# Patient Record
Sex: Male | Born: 1940
Health system: Southern US, Community
[De-identification: ages and names within clinical notes are randomized; demographics above are authoritative.]

## PROBLEM LIST (undated history)

## (undated) DIAGNOSIS — F1010 Alcohol abuse, uncomplicated: Secondary | ICD-10-CM

## (undated) DIAGNOSIS — R011 Cardiac murmur, unspecified: Secondary | ICD-10-CM

## (undated) DIAGNOSIS — M1990 Unspecified osteoarthritis, unspecified site: Secondary | ICD-10-CM

## (undated) DIAGNOSIS — J45909 Unspecified asthma, uncomplicated: Secondary | ICD-10-CM

## (undated) DIAGNOSIS — F32A Depression, unspecified: Secondary | ICD-10-CM

## (undated) DIAGNOSIS — F329 Major depressive disorder, single episode, unspecified: Secondary | ICD-10-CM

## (undated) DIAGNOSIS — F419 Anxiety disorder, unspecified: Secondary | ICD-10-CM

## (undated) DIAGNOSIS — I10 Essential (primary) hypertension: Secondary | ICD-10-CM

## (undated) DIAGNOSIS — M069 Rheumatoid arthritis, unspecified: Principal | ICD-10-CM

## (undated) DIAGNOSIS — C61 Malignant neoplasm of prostate: Secondary | ICD-10-CM

## (undated) DIAGNOSIS — K219 Gastro-esophageal reflux disease without esophagitis: Secondary | ICD-10-CM

## (undated) DIAGNOSIS — Z923 Personal history of irradiation: Secondary | ICD-10-CM

## (undated) HISTORY — DX: Depression, unspecified: F32.A

## (undated) HISTORY — DX: Gastro-esophageal reflux disease without esophagitis: K21.9

## (undated) HISTORY — DX: Cardiac murmur, unspecified: R01.1

## (undated) HISTORY — DX: Unspecified osteoarthritis, unspecified site: M19.90

## (undated) HISTORY — DX: Anxiety disorder, unspecified: F41.9

## (undated) HISTORY — DX: Rheumatoid arthritis, unspecified: M06.9

## (undated) HISTORY — DX: Essential (primary) hypertension: I10

## (undated) HISTORY — DX: Major depressive disorder, single episode, unspecified: F32.9

## (undated) HISTORY — DX: Personal history of irradiation: Z92.3

## (undated) HISTORY — PX: SHOULDER SURGERY: SHX246

---

## 1993-02-12 HISTORY — PX: LUMBAR FUSION: SHX111

## 1998-03-30 ENCOUNTER — Encounter: Admission: RE | Admit: 1998-03-30 | Discharge: 1998-04-28 | Payer: Self-pay | Admitting: Orthopaedic Surgery

## 2001-02-11 ENCOUNTER — Encounter: Payer: Self-pay | Admitting: Emergency Medicine

## 2001-02-11 ENCOUNTER — Emergency Department (HOSPITAL_COMMUNITY): Admission: EM | Admit: 2001-02-11 | Discharge: 2001-02-11 | Payer: Self-pay | Admitting: Emergency Medicine

## 2001-04-22 ENCOUNTER — Emergency Department (HOSPITAL_COMMUNITY): Admission: EM | Admit: 2001-04-22 | Discharge: 2001-04-22 | Payer: Self-pay | Admitting: Emergency Medicine

## 2001-04-22 ENCOUNTER — Encounter: Payer: Self-pay | Admitting: Emergency Medicine

## 2001-06-20 ENCOUNTER — Emergency Department (HOSPITAL_COMMUNITY): Admission: EM | Admit: 2001-06-20 | Discharge: 2001-06-20 | Payer: Self-pay | Admitting: Emergency Medicine

## 2001-06-20 ENCOUNTER — Encounter: Payer: Self-pay | Admitting: Emergency Medicine

## 2001-11-21 ENCOUNTER — Encounter: Payer: Self-pay | Admitting: Emergency Medicine

## 2001-11-21 ENCOUNTER — Emergency Department (HOSPITAL_COMMUNITY): Admission: EM | Admit: 2001-11-21 | Discharge: 2001-11-21 | Payer: Self-pay | Admitting: Emergency Medicine

## 2002-01-01 ENCOUNTER — Emergency Department (HOSPITAL_COMMUNITY): Admission: EM | Admit: 2002-01-01 | Discharge: 2002-01-01 | Payer: Self-pay | Admitting: Emergency Medicine

## 2002-04-03 ENCOUNTER — Encounter: Payer: Self-pay | Admitting: Emergency Medicine

## 2002-04-03 ENCOUNTER — Emergency Department (HOSPITAL_COMMUNITY): Admission: EM | Admit: 2002-04-03 | Discharge: 2002-04-03 | Payer: Self-pay | Admitting: Emergency Medicine

## 2002-10-23 ENCOUNTER — Emergency Department (HOSPITAL_COMMUNITY): Admission: EM | Admit: 2002-10-23 | Discharge: 2002-10-23 | Payer: Self-pay

## 2002-10-23 ENCOUNTER — Encounter: Payer: Self-pay | Admitting: Emergency Medicine

## 2002-12-02 ENCOUNTER — Inpatient Hospital Stay (HOSPITAL_COMMUNITY): Admission: EM | Admit: 2002-12-02 | Discharge: 2002-12-03 | Payer: Self-pay | Admitting: *Deleted

## 2002-12-02 ENCOUNTER — Encounter: Payer: Self-pay | Admitting: Emergency Medicine

## 2002-12-07 ENCOUNTER — Encounter: Admission: RE | Admit: 2002-12-07 | Discharge: 2002-12-07 | Payer: Self-pay | Admitting: Family Medicine

## 2002-12-30 ENCOUNTER — Encounter: Admission: RE | Admit: 2002-12-30 | Discharge: 2002-12-30 | Payer: Self-pay | Admitting: Family Medicine

## 2003-01-20 ENCOUNTER — Encounter: Admission: RE | Admit: 2003-01-20 | Discharge: 2003-01-20 | Payer: Self-pay | Admitting: Family Medicine

## 2003-01-26 ENCOUNTER — Encounter: Admission: RE | Admit: 2003-01-26 | Discharge: 2003-01-26 | Payer: Self-pay | Admitting: Sports Medicine

## 2003-01-27 ENCOUNTER — Encounter: Admission: RE | Admit: 2003-01-27 | Discharge: 2003-01-27 | Payer: Self-pay | Admitting: Sports Medicine

## 2003-11-13 ENCOUNTER — Emergency Department (HOSPITAL_COMMUNITY): Admission: EM | Admit: 2003-11-13 | Discharge: 2003-11-13 | Payer: Self-pay | Admitting: Emergency Medicine

## 2004-04-20 ENCOUNTER — Emergency Department (HOSPITAL_COMMUNITY): Admission: EM | Admit: 2004-04-20 | Discharge: 2004-04-20 | Payer: Self-pay | Admitting: Emergency Medicine

## 2004-07-17 ENCOUNTER — Emergency Department (HOSPITAL_COMMUNITY): Admission: EM | Admit: 2004-07-17 | Discharge: 2004-07-17 | Payer: Self-pay | Admitting: Emergency Medicine

## 2004-08-03 ENCOUNTER — Ambulatory Visit: Payer: Self-pay | Admitting: Family Medicine

## 2004-09-26 ENCOUNTER — Emergency Department (HOSPITAL_COMMUNITY): Admission: EM | Admit: 2004-09-26 | Discharge: 2004-09-26 | Payer: Self-pay | Admitting: Emergency Medicine

## 2004-09-29 ENCOUNTER — Ambulatory Visit: Payer: Self-pay | Admitting: Family Medicine

## 2004-10-22 ENCOUNTER — Encounter: Payer: Self-pay | Admitting: Emergency Medicine

## 2004-10-22 ENCOUNTER — Inpatient Hospital Stay (HOSPITAL_COMMUNITY): Admission: AD | Admit: 2004-10-22 | Discharge: 2004-10-24 | Payer: Self-pay | Admitting: Internal Medicine

## 2004-10-24 ENCOUNTER — Encounter (INDEPENDENT_AMBULATORY_CARE_PROVIDER_SITE_OTHER): Payer: Self-pay | Admitting: Cardiology

## 2004-11-10 ENCOUNTER — Ambulatory Visit: Payer: Self-pay | Admitting: Family Medicine

## 2005-01-01 ENCOUNTER — Emergency Department (HOSPITAL_COMMUNITY): Admission: EM | Admit: 2005-01-01 | Discharge: 2005-01-01 | Payer: Self-pay | Admitting: Emergency Medicine

## 2005-03-02 ENCOUNTER — Ambulatory Visit: Payer: Self-pay | Admitting: Family Medicine

## 2005-04-02 ENCOUNTER — Observation Stay (HOSPITAL_COMMUNITY): Admission: EM | Admit: 2005-04-02 | Discharge: 2005-04-03 | Payer: Self-pay | Admitting: Emergency Medicine

## 2005-04-02 ENCOUNTER — Ambulatory Visit: Payer: Self-pay | Admitting: Family Medicine

## 2005-04-11 ENCOUNTER — Ambulatory Visit: Payer: Self-pay | Admitting: Family Medicine

## 2005-04-30 ENCOUNTER — Ambulatory Visit: Payer: Self-pay | Admitting: Sports Medicine

## 2005-05-11 ENCOUNTER — Ambulatory Visit: Payer: Self-pay | Admitting: Family Medicine

## 2005-06-19 ENCOUNTER — Inpatient Hospital Stay (HOSPITAL_COMMUNITY): Admission: AD | Admit: 2005-06-19 | Discharge: 2005-06-20 | Payer: Self-pay | Admitting: Sports Medicine

## 2005-06-19 ENCOUNTER — Ambulatory Visit (HOSPITAL_COMMUNITY): Admission: RE | Admit: 2005-06-19 | Discharge: 2005-06-19 | Payer: Self-pay | Admitting: Family Medicine

## 2005-06-19 ENCOUNTER — Ambulatory Visit: Payer: Self-pay | Admitting: Sports Medicine

## 2005-06-19 ENCOUNTER — Ambulatory Visit: Payer: Self-pay | Admitting: Family Medicine

## 2005-06-29 ENCOUNTER — Ambulatory Visit: Payer: Self-pay | Admitting: Family Medicine

## 2005-09-18 ENCOUNTER — Emergency Department (HOSPITAL_COMMUNITY): Admission: EM | Admit: 2005-09-18 | Discharge: 2005-09-18 | Payer: Self-pay | Admitting: Emergency Medicine

## 2005-10-05 ENCOUNTER — Ambulatory Visit: Payer: Self-pay | Admitting: Family Medicine

## 2005-11-05 ENCOUNTER — Ambulatory Visit: Payer: Self-pay | Admitting: Family Medicine

## 2005-11-20 ENCOUNTER — Ambulatory Visit: Payer: Self-pay | Admitting: Family Medicine

## 2005-11-27 ENCOUNTER — Inpatient Hospital Stay (HOSPITAL_COMMUNITY): Admission: RE | Admit: 2005-11-27 | Discharge: 2005-11-28 | Payer: Self-pay | Admitting: Gastroenterology

## 2005-11-27 ENCOUNTER — Encounter (INDEPENDENT_AMBULATORY_CARE_PROVIDER_SITE_OTHER): Payer: Self-pay | Admitting: *Deleted

## 2005-12-17 ENCOUNTER — Emergency Department (HOSPITAL_COMMUNITY): Admission: EM | Admit: 2005-12-17 | Discharge: 2005-12-17 | Payer: Self-pay | Admitting: Family Medicine

## 2006-04-11 DIAGNOSIS — G43909 Migraine, unspecified, not intractable, without status migrainosus: Secondary | ICD-10-CM | POA: Insufficient documentation

## 2006-04-11 DIAGNOSIS — K219 Gastro-esophageal reflux disease without esophagitis: Secondary | ICD-10-CM | POA: Insufficient documentation

## 2006-04-11 DIAGNOSIS — I10 Essential (primary) hypertension: Secondary | ICD-10-CM

## 2006-05-13 ENCOUNTER — Telehealth: Payer: Self-pay | Admitting: *Deleted

## 2006-05-14 ENCOUNTER — Ambulatory Visit: Payer: Self-pay | Admitting: Sports Medicine

## 2006-05-28 ENCOUNTER — Ambulatory Visit: Payer: Self-pay | Admitting: Family Medicine

## 2006-05-28 ENCOUNTER — Encounter (INDEPENDENT_AMBULATORY_CARE_PROVIDER_SITE_OTHER): Payer: Self-pay | Admitting: Family Medicine

## 2006-05-28 ENCOUNTER — Telehealth: Payer: Self-pay | Admitting: *Deleted

## 2006-06-21 ENCOUNTER — Ambulatory Visit: Payer: Self-pay | Admitting: Sports Medicine

## 2006-06-21 ENCOUNTER — Encounter (INDEPENDENT_AMBULATORY_CARE_PROVIDER_SITE_OTHER): Payer: Self-pay | Admitting: *Deleted

## 2006-06-21 DIAGNOSIS — L821 Other seborrheic keratosis: Secondary | ICD-10-CM

## 2006-06-21 LAB — CONVERTED CEMR LAB
BUN: 10 mg/dL (ref 6–23)
Chloride: 103 meq/L (ref 96–112)
Glucose, Bld: 98 mg/dL (ref 70–99)
Potassium: 4.2 meq/L (ref 3.5–5.3)
Sodium: 139 meq/L (ref 135–145)

## 2006-08-21 ENCOUNTER — Emergency Department (HOSPITAL_COMMUNITY): Admission: EM | Admit: 2006-08-21 | Discharge: 2006-08-21 | Payer: Self-pay | Admitting: Family Medicine

## 2006-10-11 ENCOUNTER — Emergency Department (HOSPITAL_COMMUNITY): Admission: EM | Admit: 2006-10-11 | Discharge: 2006-10-11 | Payer: Self-pay | Admitting: Emergency Medicine

## 2006-11-01 ENCOUNTER — Ambulatory Visit: Payer: Self-pay | Admitting: Family Medicine

## 2006-11-01 ENCOUNTER — Encounter (INDEPENDENT_AMBULATORY_CARE_PROVIDER_SITE_OTHER): Payer: Self-pay | Admitting: Family Medicine

## 2006-11-01 LAB — CONVERTED CEMR LAB
Basophils Absolute: 0 10*3/uL (ref 0.0–0.1)
Basophils Relative: 0 % (ref 0–1)
CO2: 31 meq/L (ref 19–32)
HCT: 42.3 % (ref 39.0–52.0)
Lymphocytes Relative: 14 % (ref 12–46)
Lymphs Abs: 0.7 10*3/uL (ref 0.7–3.3)
Monocytes Absolute: 0.6 10*3/uL (ref 0.2–0.7)
Neutro Abs: 3.3 10*3/uL (ref 1.7–7.7)
Neutrophils Relative %: 71 % (ref 43–77)
Platelets: 258 10*3/uL (ref 150–400)
RBC: 4.9 M/uL (ref 4.22–5.81)
Sodium: 138 meq/L (ref 135–145)
WBC: 4.7 10*3/uL (ref 4.0–10.5)

## 2006-12-09 ENCOUNTER — Encounter (INDEPENDENT_AMBULATORY_CARE_PROVIDER_SITE_OTHER): Payer: Self-pay | Admitting: *Deleted

## 2007-02-04 ENCOUNTER — Ambulatory Visit: Payer: Self-pay | Admitting: Family Medicine

## 2007-02-04 ENCOUNTER — Ambulatory Visit (HOSPITAL_COMMUNITY): Admission: RE | Admit: 2007-02-04 | Discharge: 2007-02-04 | Payer: Self-pay | Admitting: Family Medicine

## 2007-02-19 ENCOUNTER — Encounter (INDEPENDENT_AMBULATORY_CARE_PROVIDER_SITE_OTHER): Payer: Self-pay | Admitting: *Deleted

## 2007-03-06 ENCOUNTER — Encounter (INDEPENDENT_AMBULATORY_CARE_PROVIDER_SITE_OTHER): Payer: Self-pay | Admitting: *Deleted

## 2007-03-06 ENCOUNTER — Ambulatory Visit: Payer: Self-pay | Admitting: Family Medicine

## 2007-03-07 LAB — CONVERTED CEMR LAB
Chloride: 102 meq/L (ref 96–112)
Creatinine, Ser: 0.91 mg/dL (ref 0.40–1.50)
Glucose, Bld: 90 mg/dL (ref 70–99)
HDL: 55 mg/dL (ref >39)
LDL Cholesterol: 115 mg/dL — ABNORMAL HIGH (ref 0–99)
PSA: 1.78 ng/mL (ref 0.10–4.00)
Total CHOL/HDL Ratio: 3.4

## 2007-04-30 ENCOUNTER — Encounter (INDEPENDENT_AMBULATORY_CARE_PROVIDER_SITE_OTHER): Payer: Self-pay | Admitting: *Deleted

## 2007-05-12 ENCOUNTER — Ambulatory Visit: Payer: Self-pay | Admitting: Internal Medicine

## 2007-05-12 ENCOUNTER — Observation Stay (HOSPITAL_COMMUNITY): Admission: EM | Admit: 2007-05-12 | Discharge: 2007-05-13 | Payer: Self-pay | Admitting: Emergency Medicine

## 2007-05-12 ENCOUNTER — Encounter (INDEPENDENT_AMBULATORY_CARE_PROVIDER_SITE_OTHER): Payer: Self-pay | Admitting: Family Medicine

## 2007-05-12 DIAGNOSIS — J449 Chronic obstructive pulmonary disease, unspecified: Secondary | ICD-10-CM | POA: Insufficient documentation

## 2007-05-12 DIAGNOSIS — R0789 Other chest pain: Secondary | ICD-10-CM

## 2007-05-19 ENCOUNTER — Ambulatory Visit: Payer: Self-pay | Admitting: Family Medicine

## 2007-06-02 ENCOUNTER — Ambulatory Visit: Payer: Self-pay | Admitting: Cardiology

## 2007-06-03 ENCOUNTER — Ambulatory Visit: Payer: Self-pay

## 2007-06-03 ENCOUNTER — Encounter (INDEPENDENT_AMBULATORY_CARE_PROVIDER_SITE_OTHER): Payer: Self-pay | Admitting: *Deleted

## 2007-06-16 ENCOUNTER — Ambulatory Visit: Payer: Self-pay | Admitting: Sports Medicine

## 2007-06-24 ENCOUNTER — Ambulatory Visit: Payer: Self-pay | Admitting: Family Medicine

## 2007-06-24 ENCOUNTER — Encounter (INDEPENDENT_AMBULATORY_CARE_PROVIDER_SITE_OTHER): Payer: Self-pay | Admitting: *Deleted

## 2007-06-24 DIAGNOSIS — E785 Hyperlipidemia, unspecified: Secondary | ICD-10-CM | POA: Insufficient documentation

## 2007-06-24 LAB — CONVERTED CEMR LAB: Direct LDL: 99 mg/dL

## 2007-06-26 ENCOUNTER — Encounter (INDEPENDENT_AMBULATORY_CARE_PROVIDER_SITE_OTHER): Payer: Self-pay | Admitting: *Deleted

## 2007-09-18 ENCOUNTER — Telehealth: Payer: Self-pay | Admitting: *Deleted

## 2007-09-18 ENCOUNTER — Ambulatory Visit: Payer: Self-pay | Admitting: Family Medicine

## 2007-09-26 ENCOUNTER — Telehealth: Payer: Self-pay | Admitting: *Deleted

## 2007-10-13 ENCOUNTER — Telehealth: Payer: Self-pay | Admitting: *Deleted

## 2007-10-13 ENCOUNTER — Encounter: Payer: Self-pay | Admitting: Family Medicine

## 2007-10-13 ENCOUNTER — Ambulatory Visit: Payer: Self-pay | Admitting: Family Medicine

## 2007-10-14 ENCOUNTER — Ambulatory Visit: Payer: Self-pay | Admitting: Sports Medicine

## 2007-10-14 LAB — CONVERTED CEMR LAB
Calcium: 9.5 mg/dL (ref 8.4–10.5)
Chloride: 103 meq/L (ref 96–112)
Creatinine, Ser: 1.1 mg/dL (ref 0.40–1.50)
Glucose, Bld: 97 mg/dL (ref 70–99)
Hemoglobin: 14.1 g/dL (ref 13.0–17.0)
MCHC: 33.1 g/dL (ref 30.0–36.0)
MCV: 86.1 fL (ref 78.0–100.0)
Potassium: 4.1 meq/L (ref 3.5–5.3)
RBC: 4.95 M/uL (ref 4.22–5.81)
RDW: 15.1 % (ref 11.5–15.5)

## 2007-10-22 ENCOUNTER — Encounter: Payer: Self-pay | Admitting: Family Medicine

## 2007-10-22 ENCOUNTER — Ambulatory Visit: Payer: Self-pay | Admitting: Vascular Surgery

## 2007-10-22 ENCOUNTER — Ambulatory Visit (HOSPITAL_COMMUNITY): Admission: RE | Admit: 2007-10-22 | Discharge: 2007-10-22 | Payer: Self-pay | Admitting: Family Medicine

## 2007-10-22 ENCOUNTER — Ambulatory Visit: Payer: Self-pay | Admitting: Cardiovascular Disease

## 2008-01-22 ENCOUNTER — Telehealth: Payer: Self-pay | Admitting: *Deleted

## 2008-01-22 ENCOUNTER — Ambulatory Visit: Payer: Self-pay | Admitting: Family Medicine

## 2008-01-22 ENCOUNTER — Encounter: Payer: Self-pay | Admitting: Family Medicine

## 2008-02-19 ENCOUNTER — Telehealth (INDEPENDENT_AMBULATORY_CARE_PROVIDER_SITE_OTHER): Payer: Self-pay | Admitting: *Deleted

## 2008-02-23 ENCOUNTER — Encounter: Admission: RE | Admit: 2008-02-23 | Discharge: 2008-04-19 | Payer: Self-pay | Admitting: Family Medicine

## 2008-02-23 ENCOUNTER — Encounter: Payer: Self-pay | Admitting: Family Medicine

## 2008-04-26 ENCOUNTER — Ambulatory Visit: Payer: Self-pay | Admitting: Family Medicine

## 2008-05-27 ENCOUNTER — Telehealth: Payer: Self-pay | Admitting: *Deleted

## 2008-06-22 ENCOUNTER — Telehealth: Payer: Self-pay | Admitting: Family Medicine

## 2008-06-23 ENCOUNTER — Ambulatory Visit: Payer: Self-pay | Admitting: Family Medicine

## 2008-06-23 ENCOUNTER — Ambulatory Visit (HOSPITAL_COMMUNITY): Admission: RE | Admit: 2008-06-23 | Discharge: 2008-06-23 | Payer: Self-pay | Admitting: Family Medicine

## 2008-06-23 ENCOUNTER — Encounter (INDEPENDENT_AMBULATORY_CARE_PROVIDER_SITE_OTHER): Payer: Self-pay | Admitting: Family Medicine

## 2008-06-23 DIAGNOSIS — R42 Dizziness and giddiness: Secondary | ICD-10-CM | POA: Insufficient documentation

## 2008-06-24 LAB — CONVERTED CEMR LAB
Basophils Relative: 0 % (ref 0–1)
Creatinine, Ser: 0.85 mg/dL (ref 0.40–1.50)
Eosinophils Relative: 0 % (ref 0–5)
HCT: 42.7 % (ref 39.0–52.0)
Hemoglobin: 14.3 g/dL (ref 13.0–17.0)
Lymphs Abs: 0.7 10*3/uL (ref 0.7–4.0)
MCHC: 33.5 g/dL (ref 30.0–36.0)
MCV: 86.1 fL (ref 78.0–100.0)
Monocytes Absolute: 0.4 10*3/uL (ref 0.1–1.0)
Neutro Abs: 2.2 10*3/uL (ref 1.7–7.7)
Neutrophils Relative %: 66 % (ref 43–77)
Platelets: 200 10*3/uL (ref 150–400)
Sodium: 139 meq/L (ref 135–145)

## 2008-06-25 ENCOUNTER — Encounter: Payer: Self-pay | Admitting: Family Medicine

## 2008-06-28 ENCOUNTER — Encounter: Payer: Self-pay | Admitting: Family Medicine

## 2008-06-28 ENCOUNTER — Ambulatory Visit: Payer: Self-pay | Admitting: Family Medicine

## 2008-07-01 DIAGNOSIS — C61 Malignant neoplasm of prostate: Secondary | ICD-10-CM | POA: Insufficient documentation

## 2008-07-01 DIAGNOSIS — R972 Elevated prostate specific antigen [PSA]: Secondary | ICD-10-CM | POA: Insufficient documentation

## 2008-07-01 LAB — CONVERTED CEMR LAB
Basophils Relative: 0 % (ref 0–1)
Eosinophils Absolute: 0 10*3/uL (ref 0.0–0.7)
Eosinophils Relative: 1 % (ref 0–5)
Hemoglobin: 13.3 g/dL (ref 13.0–17.0)
MCV: 86.1 fL (ref 78.0–100.0)
Monocytes Absolute: 0.6 10*3/uL (ref 0.1–1.0)
Neutro Abs: 2.3 10*3/uL (ref 1.7–7.7)
PSA: 3.27 ng/mL (ref 0.10–4.00)
RBC: 4.45 M/uL (ref 4.22–5.81)
WBC: 3.6 10*3/uL — ABNORMAL LOW (ref 4.0–10.5)

## 2008-07-19 ENCOUNTER — Encounter (INDEPENDENT_AMBULATORY_CARE_PROVIDER_SITE_OTHER): Payer: Self-pay | Admitting: Family Medicine

## 2008-07-30 ENCOUNTER — Ambulatory Visit: Payer: Self-pay | Admitting: Family Medicine

## 2008-08-02 ENCOUNTER — Ambulatory Visit (HOSPITAL_COMMUNITY): Admission: RE | Admit: 2008-08-02 | Discharge: 2008-08-02 | Payer: Self-pay | Admitting: Family Medicine

## 2008-08-18 ENCOUNTER — Encounter: Payer: Self-pay | Admitting: Family Medicine

## 2008-08-26 ENCOUNTER — Encounter: Payer: Self-pay | Admitting: Family Medicine

## 2008-09-09 ENCOUNTER — Encounter: Payer: Self-pay | Admitting: *Deleted

## 2008-09-09 ENCOUNTER — Encounter: Payer: Self-pay | Admitting: Family Medicine

## 2008-09-09 ENCOUNTER — Ambulatory Visit: Payer: Self-pay | Admitting: Family Medicine

## 2008-09-09 DIAGNOSIS — G252 Other specified forms of tremor: Secondary | ICD-10-CM

## 2008-09-09 DIAGNOSIS — M2550 Pain in unspecified joint: Secondary | ICD-10-CM | POA: Insufficient documentation

## 2008-09-09 DIAGNOSIS — G250 Essential tremor: Secondary | ICD-10-CM

## 2008-09-09 LAB — CONVERTED CEMR LAB
Folate: 6 ng/mL
Vitamin B-12: 276 pg/mL (ref 211–911)

## 2008-09-14 ENCOUNTER — Encounter: Payer: Self-pay | Admitting: *Deleted

## 2008-09-20 ENCOUNTER — Encounter: Payer: Self-pay | Admitting: Family Medicine

## 2008-09-20 ENCOUNTER — Telehealth: Payer: Self-pay | Admitting: *Deleted

## 2008-09-20 ENCOUNTER — Ambulatory Visit: Payer: Self-pay | Admitting: Family Medicine

## 2008-09-20 DIAGNOSIS — R002 Palpitations: Secondary | ICD-10-CM

## 2008-09-20 DIAGNOSIS — R55 Syncope and collapse: Secondary | ICD-10-CM | POA: Insufficient documentation

## 2008-09-20 LAB — CONVERTED CEMR LAB
ALT: 16 units/L (ref 0–53)
AST: 11 units/L (ref 0–37)
Albumin: 4.1 g/dL (ref 3.5–5.2)
Alkaline Phosphatase: 44 units/L (ref 39–117)
BUN: 26 mg/dL — ABNORMAL HIGH (ref 6–23)
CO2: 23 meq/L (ref 19–32)
Calcium: 9.3 mg/dL (ref 8.4–10.5)
Chloride: 104 meq/L (ref 96–112)
Creatinine, Ser: 1.24 mg/dL (ref 0.40–1.50)
Glucose, Bld: 121 mg/dL — ABNORMAL HIGH (ref 70–99)
HCT: 39.7 % (ref 39.0–52.0)
Hemoglobin: 13 g/dL (ref 13.0–17.0)
MCHC: 32.7 g/dL (ref 30.0–36.0)
MCV: 86.5 fL (ref 78.0–100.0)
Platelets: 231 10*3/uL (ref 150–400)
Potassium: 4.9 meq/L (ref 3.5–5.3)
RBC: 4.59 M/uL (ref 4.22–5.81)
RDW: 15.9 % — ABNORMAL HIGH (ref 11.5–15.5)
Sodium: 139 meq/L (ref 135–145)
TSH: 0.676 microintl units/mL (ref 0.350–4.500)
Total Bilirubin: 0.5 mg/dL (ref 0.3–1.2)
Total Protein: 6.8 g/dL (ref 6.0–8.3)
WBC: 6.7 10*3/uL (ref 4.0–10.5)

## 2008-09-21 ENCOUNTER — Ambulatory Visit: Payer: Self-pay | Admitting: Family Medicine

## 2008-09-21 DIAGNOSIS — R7309 Other abnormal glucose: Secondary | ICD-10-CM

## 2008-09-22 ENCOUNTER — Encounter: Payer: Self-pay | Admitting: Family Medicine

## 2008-09-24 ENCOUNTER — Encounter: Payer: Self-pay | Admitting: Family Medicine

## 2008-09-28 ENCOUNTER — Ambulatory Visit: Payer: Self-pay | Admitting: Family Medicine

## 2008-10-04 ENCOUNTER — Encounter: Payer: Self-pay | Admitting: *Deleted

## 2008-10-22 ENCOUNTER — Encounter: Payer: Self-pay | Admitting: Family Medicine

## 2008-10-27 ENCOUNTER — Encounter: Payer: Self-pay | Admitting: Family Medicine

## 2008-11-04 ENCOUNTER — Encounter: Payer: Self-pay | Admitting: Family Medicine

## 2008-11-08 ENCOUNTER — Encounter: Payer: Self-pay | Admitting: Family Medicine

## 2008-12-03 ENCOUNTER — Emergency Department (HOSPITAL_COMMUNITY): Admission: EM | Admit: 2008-12-03 | Discharge: 2008-12-03 | Payer: Self-pay | Admitting: Family Medicine

## 2008-12-07 ENCOUNTER — Ambulatory Visit: Payer: Self-pay | Admitting: Family Medicine

## 2008-12-15 ENCOUNTER — Encounter: Payer: Self-pay | Admitting: Family Medicine

## 2008-12-20 ENCOUNTER — Encounter: Payer: Self-pay | Admitting: Family Medicine

## 2008-12-22 ENCOUNTER — Ambulatory Visit: Payer: Self-pay | Admitting: Family Medicine

## 2008-12-23 ENCOUNTER — Ambulatory Visit (HOSPITAL_COMMUNITY): Admission: RE | Admit: 2008-12-23 | Discharge: 2008-12-23 | Payer: Self-pay | Admitting: Family Medicine

## 2009-01-03 ENCOUNTER — Encounter: Payer: Self-pay | Admitting: Family Medicine

## 2009-01-11 ENCOUNTER — Ambulatory Visit: Payer: Self-pay | Admitting: Family Medicine

## 2009-01-11 ENCOUNTER — Telehealth: Payer: Self-pay | Admitting: Family Medicine

## 2009-01-18 ENCOUNTER — Telehealth: Payer: Self-pay | Admitting: *Deleted

## 2009-01-20 ENCOUNTER — Encounter: Admission: RE | Admit: 2009-01-20 | Discharge: 2009-02-10 | Payer: Self-pay | Admitting: Family Medicine

## 2009-01-20 ENCOUNTER — Encounter: Payer: Self-pay | Admitting: Family Medicine

## 2009-02-14 ENCOUNTER — Encounter: Admission: RE | Admit: 2009-02-14 | Discharge: 2009-02-16 | Payer: Self-pay | Admitting: Family Medicine

## 2009-02-21 ENCOUNTER — Encounter: Payer: Self-pay | Admitting: Family Medicine

## 2009-02-22 ENCOUNTER — Telehealth: Payer: Self-pay | Admitting: Family Medicine

## 2009-02-24 ENCOUNTER — Encounter: Payer: Self-pay | Admitting: Family Medicine

## 2009-03-02 ENCOUNTER — Ambulatory Visit: Payer: Self-pay | Admitting: Family Medicine

## 2009-03-03 ENCOUNTER — Encounter: Payer: Self-pay | Admitting: Family Medicine

## 2009-03-16 ENCOUNTER — Encounter: Payer: Self-pay | Admitting: Family Medicine

## 2009-04-13 ENCOUNTER — Encounter: Payer: Self-pay | Admitting: Family Medicine

## 2009-04-27 ENCOUNTER — Encounter: Payer: Self-pay | Admitting: Family Medicine

## 2009-05-13 ENCOUNTER — Encounter: Payer: Self-pay | Admitting: Family Medicine

## 2009-05-13 ENCOUNTER — Ambulatory Visit: Payer: Self-pay | Admitting: Family Medicine

## 2009-05-13 LAB — CONVERTED CEMR LAB
AST: 15 units/L (ref 0–37)
Albumin: 4 g/dL (ref 3.5–5.2)
CO2: 26 meq/L (ref 19–32)
Cholesterol: 210 mg/dL — ABNORMAL HIGH (ref 0–200)
Creatinine, Ser: 0.91 mg/dL (ref 0.40–1.50)
HDL: 46 mg/dL (ref >39)
Hemoglobin: 12.5 g/dL — ABNORMAL LOW (ref 13.0–17.0)
MCHC: 32.6 g/dL (ref 30.0–36.0)
Potassium: 4.4 meq/L (ref 3.5–5.3)
Total CHOL/HDL Ratio: 4.6
Total Protein: 6.5 g/dL (ref 6.0–8.3)
Triglycerides: 95 mg/dL (ref <150)
WBC: 4.1 10*3/uL (ref 4.0–10.5)

## 2009-05-16 ENCOUNTER — Encounter: Payer: Self-pay | Admitting: Family Medicine

## 2009-05-25 ENCOUNTER — Ambulatory Visit: Payer: Self-pay | Admitting: Family Medicine

## 2009-07-12 ENCOUNTER — Encounter: Payer: Self-pay | Admitting: Family Medicine

## 2009-07-21 ENCOUNTER — Encounter: Payer: Self-pay | Admitting: Family Medicine

## 2009-08-09 ENCOUNTER — Telehealth: Payer: Self-pay | Admitting: Family Medicine

## 2009-08-09 ENCOUNTER — Ambulatory Visit: Payer: Self-pay | Admitting: Family Medicine

## 2009-09-21 ENCOUNTER — Ambulatory Visit: Payer: Self-pay | Admitting: Family Medicine

## 2009-09-26 ENCOUNTER — Encounter: Payer: Self-pay | Admitting: Family Medicine

## 2009-10-11 ENCOUNTER — Telehealth: Payer: Self-pay | Admitting: Family Medicine

## 2009-11-21 ENCOUNTER — Telehealth: Payer: Self-pay | Admitting: Family Medicine

## 2010-01-13 ENCOUNTER — Encounter: Payer: Self-pay | Admitting: *Deleted

## 2010-01-16 ENCOUNTER — Encounter: Payer: Self-pay | Admitting: Family Medicine

## 2010-01-19 ENCOUNTER — Ambulatory Visit: Payer: Self-pay | Admitting: Family Medicine

## 2010-01-19 DIAGNOSIS — M25519 Pain in unspecified shoulder: Secondary | ICD-10-CM | POA: Insufficient documentation

## 2010-02-27 ENCOUNTER — Encounter: Payer: Self-pay | Admitting: Family Medicine

## 2010-02-28 ENCOUNTER — Emergency Department (HOSPITAL_COMMUNITY)
Admission: EM | Admit: 2010-02-28 | Discharge: 2010-02-28 | Payer: Self-pay | Source: Home / Self Care | Admitting: Family Medicine

## 2010-03-01 LAB — POCT RAPID STREP A (OFFICE): Streptococcus, Group A Screen (Direct): NEGATIVE

## 2010-03-03 ENCOUNTER — Ambulatory Visit
Admission: RE | Admit: 2010-03-03 | Discharge: 2010-03-03 | Payer: Self-pay | Source: Home / Self Care | Attending: Family Medicine | Admitting: Family Medicine

## 2010-03-06 ENCOUNTER — Ambulatory Visit (HOSPITAL_COMMUNITY)
Admission: RE | Admit: 2010-03-06 | Discharge: 2010-03-06 | Payer: Self-pay | Source: Home / Self Care | Attending: Family Medicine | Admitting: Family Medicine

## 2010-03-07 ENCOUNTER — Telehealth: Payer: Self-pay | Admitting: *Deleted

## 2010-03-09 ENCOUNTER — Encounter (INDEPENDENT_AMBULATORY_CARE_PROVIDER_SITE_OTHER): Payer: Self-pay | Admitting: *Deleted

## 2010-03-10 ENCOUNTER — Encounter: Payer: Self-pay | Admitting: Family Medicine

## 2010-03-10 ENCOUNTER — Ambulatory Visit: Admit: 2010-03-10 | Payer: Self-pay

## 2010-03-11 ENCOUNTER — Emergency Department (HOSPITAL_COMMUNITY)
Admission: EM | Admit: 2010-03-11 | Discharge: 2010-03-11 | Payer: Self-pay | Source: Home / Self Care | Admitting: Emergency Medicine

## 2010-03-11 ENCOUNTER — Encounter: Payer: Self-pay | Admitting: Sports Medicine

## 2010-03-11 LAB — URINE MICROSCOPIC-ADD ON

## 2010-03-11 LAB — COMPREHENSIVE METABOLIC PANEL
CO2: 24 mEq/L (ref 19–32)
Chloride: 103 mEq/L (ref 96–112)
Creatinine, Ser: 1.14 mg/dL (ref 0.4–1.5)
GFR calc Af Amer: >60 mL/min (ref >60)
Glucose, Bld: 117 mg/dL — ABNORMAL HIGH (ref 70–99)
Potassium: 4.1 mEq/L (ref 3.5–5.1)
Total Protein: 7.3 g/dL (ref 6.0–8.3)

## 2010-03-11 LAB — CBC
Platelets: 257 10*3/uL (ref 150–400)
WBC: 14 10*3/uL — ABNORMAL HIGH (ref 4.0–10.5)

## 2010-03-11 LAB — URINALYSIS, ROUTINE W REFLEX MICROSCOPIC
Bilirubin Urine: NEGATIVE
Ketones, ur: 15 mg/dL — AB
Nitrite: NEGATIVE
Protein, ur: 30 mg/dL — AB
Specific Gravity, Urine: 1.021 (ref 1.005–1.030)
Urine Glucose, Fasting: NEGATIVE mg/dL
Urobilinogen, UA: 0.2 mg/dL (ref 0.0–1.0)
pH: 6.5 (ref 5.0–8.0)

## 2010-03-11 LAB — DIFFERENTIAL
Basophils Relative: 0 % (ref 0–1)
Eosinophils Absolute: 0 10*3/uL (ref 0.0–0.7)
Monocytes Absolute: 1 10*3/uL (ref 0.1–1.0)
Monocytes Relative: 7 % (ref 3–12)
Neutrophils Relative %: 89 % — ABNORMAL HIGH (ref 43–77)

## 2010-03-12 LAB — URINE CULTURE

## 2010-03-13 LAB — GC/CHLAMYDIA PROBE AMP, GENITAL: GC Probe Amp, Genital: NEGATIVE

## 2010-03-14 NOTE — Miscellaneous (Signed)
Summary: prior auth for ondansetron   Clinical Lists Changes prior auth form to pcp.Golden Circle RN  September 26, 2009 4:09 PM     completed.

## 2010-03-14 NOTE — Progress Notes (Signed)
Summary: refill  Medications Added ALBUTEROL 90 MCG/ACT AERS (ALBUTEROL) Inhale 1-2 puff using inhaler every four hours ASPIRIN EC 81 MG TBEC (ASPIRIN) Take 1 tablet by mouth once a day LISINOPRIL-HYDROCHLOROTHIAZIDE 20-25 MG TABS (LISINOPRIL-HYDROCHLOROTHIAZIDE) Take 1 tablet by mouth once a day LISINOPRIL-HYDROCHLOROTHIAZIDE 20-25 MG TABS (LISINOPRIL-HYDROCHLOROTHIAZIDE) Take 1 tablet by mouth once a day MECLIZINE HCL 25 MG TABS (MECLIZINE HCL) Take 1 tablet by mouth every six hours MECLIZINE HCL 25 MG TABS (MECLIZINE HCL) Take 1 tablet by mouth every six hours PROTONIX 40 MG  TBEC (PANTOPRAZOLE SODIUM) once daily PRILOSEC OTC 20 MG TBEC (OMEPRAZOLE MAGNESIUM) 2 tablet by mouth twice a day PROTONIX 40 MG  TBEC (PANTOPRAZOLE SODIUM) once daily SPIRIVA HANDIHALER 18 MCG CAPS (TIOTROPIUM BROMIDE MONOHYDRATE) Inhale 1 capsule by mouth once a day SPIRIVA HANDIHALER 18 MCG CAPS (TIOTROPIUM BROMIDE MONOHYDRATE) Inhale 1 capsule by mouth once a day LISINOPRIL 10 MG TABS (LISINOPRIL) 1 by mouth once daily LISINOPRIL 10 MG TABS (LISINOPRIL) 1 by mouth once daily LISINOPRIL-HYDROCHLOROTHIAZIDE 10-12.5 MG  TABS (LISINOPRIL-HYDROCHLOROTHIAZIDE) 1 by mouth daily TRAMADOL HCL 50 MG  TABS (TRAMADOL HCL) 1or2 two times a day  prn TRAMADOL HCL 50 MG  TABS (TRAMADOL HCL) 1or2 two times a day  prn VICODIN 5-500 MG TABS (HYDROCODONE-ACETAMINOPHEN) 1 by mouth every 12 hrs as needed VICODIN 5-500 MG TABS (HYDROCODONE-ACETAMINOPHEN) 1 by mouth every 12 hrs as needed ALEVE 220 MG  TABS (NAPROXEN SODIUM) as needed for arthritis pain ALEVE 220 MG  TABS (NAPROXEN SODIUM) as needed for arthritis pain TRAMADOL HCL 50 MG  TABS (TRAMADOL HCL) 1 by mouth q 4-6 hrs prn TRAMADOL HCL 50 MG  TABS (TRAMADOL HCL) 1 by mouth q 4-6 hrs prn TAMIFLU 75 MG  CAPS (OSELTAMIVIR PHOSPHATE) 1 by mouth two times a day x 5 days TAMIFLU 75 MG  CAPS (OSELTAMIVIR PHOSPHATE) 1 by mouth two times a day x 5 days HYDROCODONE-ACETAMINOPHEN 5-325  MG TABS (HYDROCODONE-ACETAMINOPHEN) 1-2 by mouth every 6 hours as needed for your very worst arthritis pain, do not drive after taking HYDROCODONE-ACETAMINOPHEN 5-325 MG TABS (HYDROCODONE-ACETAMINOPHEN) 1-2 by mouth every 6 hours as needed for your very worst arthritis pain, do not drive after taking HYDROCODONE-ACETAMINOPHEN 5-325 MG TABS (HYDROCODONE-ACETAMINOPHEN) 1-2 by mouth every 6 hours as needed for your very worst arthritis pain, do not drive after taking OMEPRAZOLE 40 MG CPDR (OMEPRAZOLE) 1 by mouth once twice daily for reflux (pharmacist please disregard previous script) * OMEPRAZOLE 40 MG take one tablet twice daily OMEPRAZOLE 40 MG CPDR (OMEPRAZOLE) 1 by mouth once daily for reflux OMEPRAZOLE 40 MG CPDR (OMEPRAZOLE) 1 by mouth once twice daily for reflux LISINOPRIL 20 MG TABS (LISINOPRIL) Take one tablet daily for blood pressure LISINOPRIL 20 MG TABS (LISINOPRIL) Take one tablet daily for blood pressure LISINOPRIL-HYDROCHLOROTHIAZIDE 20-12.5 MG TABS (LISINOPRIL-HYDROCHLOROTHIAZIDE) 1 by mouth once daily for blood pressure.  replaces old lisinopril CIPRO 500 MG TABS (CIPROFLOXACIN HCL) 1 by mouth two times a day per urologist CIPRO 500 MG TABS (CIPROFLOXACIN HCL) 1 by mouth two times a day per urologist INDERAL LA 60 MG XR24H-CAP (PROPRANOLOL HCL) 1 by mouth once daily for blood pressure and headache control INDERAL LA 60 MG XR24H-CAP (PROPRANOLOL HCL) 1 by mouth once daily for blood pressure and headache control PREDNISONE 20 MG TABS (PREDNISONE) per Dr Kellie Simmering PREDNISONE 20 MG TABS (PREDNISONE) per Dr Kellie Simmering FLEXERIL 10 MG TABS (CYCLOBENZAPRINE HCL) 1 by mouth two times a day as needed muscle spasms FLEXERIL 10 MG TABS (CYCLOBENZAPRINE HCL) 1 by mouth  two times a day as needed muscle spasms IBUPROFEN 600 MG TABS (IBUPROFEN) 1 by mouth q6hr as needed pain IBUPROFEN 600 MG TABS (IBUPROFEN) 1 by mouth q6hr as needed pain ATROVENT HFA 17 MCG/ACT AERS (IPRATROPIUM BROMIDE HFA) 2 puffs  inhaled qid for COPD MIRALAX  POWD (POLYETHYLENE GLYCOL 3350) 1 capful twice daily until stools are loose then once daily thereafter DULCOLAX 10 MG SUPP (BISACODYL) insert one suppository once daily until stools are loose then as needed to keep 1 regular stool daily ZOFRAN 4 MG/5ML SOLN (ONDANSETRON HCL) Use 4 mg every 8 hours for nausea.       Phone Note Refill Request Call back at [phone] Message from:  Patient  Refills Requested: Medication #1:  OMEPRAZOLE 40 MG CPDR 1 by mouth once twice daily for reflux  Medication #2:  HYDROCODONE-ACETAMINOPHEN 5-325 MG TABS 1-2 by mouth every 6 hours as needed for your very worst arthritis pain Initial call taken by: De Nurse,  October 11, 2009 12:25 PM  Follow-up for Phone Call        pt is now out of meds and needs asap Follow-up by: De Nurse,  October 12, 2009 11:59 AM  Additional Follow-up for Phone Call Additional follow up Details #1::        will page pcp Additional Follow-up by: Golden Circle RN,  October 12, 2009 12:10 PM    Prescriptions: OMEPRAZOLE 40 MG CPDR (OMEPRAZOLE) 1 by mouth once twice daily for reflux  #64 x 2   Entered and Authorized by:   Alvia Grove DO   Signed by:   Alvia Grove DO on 10/12/2009   Method used:   [facility]  [address] [phone]* (retail)       [address]       [place], [place]  [id number]       Ph: [phone] or [phone]       Fax: [phone]   RxID:   [id number]   [facility]  He should not be out of his hydrocodone yet, I just refilled that at his last visit 09-21-09.   Appended Document: refill notified pt. he said he does have some pain pills left

## 2010-03-14 NOTE — Letter (Signed)
Summary: [facility] Exception Request for Hydrocodone-APAP  [facility]  [address]   [place], [place] [address]   Phone: [phone]  Fax: [phone]    DATE:  04/13/2009    TO:  [facility] PART D        PHARMACY DEPARTMENT        [phone]        [place], [place]  [address]        PHONE:  [phone]        FAX:    [phone]  FROM:  Laurie Panda, MD    RE:  Joel Donaldson (DOB 23-May-1940)  I received your notification regarding the above patient that the prescribed quantity exceeds plan quantity limits.  I am writing to requestan exception for the following medication:      HYDROCODONE-ACETAMINOPHEN 5-325  Thank you for your consideration in this matter.  Regards,   Laurie Panda, MD  Appended DocumentRolene Arbour Exception Request for Hydrocodone-APAP signed and placed in to be faxed box

## 2010-03-14 NOTE — Letter (Signed)
Summary: Spiriva Prior Authorization  [facility]  [address]   [place], [place] [address]   Phone: [phone]  Fax: [phone]    03/03/2009  [facility] PART D Appeals Department [address] [address] [place], [place]  [address]  TO WHOM IT MAY CONCERN:  I am writing on behalf of my patient:    Joel Donaldson (DOB Dec 04, 1940)  Please continue to fill his Spiriva 18 micrograms CP-Handihaler as prescribed.  He has been on this for at least 3 years and his COPD is currently well-controlled on his present regimen.  Sincerely,   Romero Belling MD  Appended Document: Spiriva Prior Authorization not authorized. form to Dr. Constance Goltz who is covering for Dr. Sandi Mealy

## 2010-03-14 NOTE — Consult Note (Signed)
Summary: [facility]   Imported By: Clydell Hakim 07/14/2009 13:43:31  _____________________________________________________________________  External Attachment:    Type:   Image     Comment:   External Document

## 2010-03-14 NOTE — Assessment & Plan Note (Signed)
Summary: SOB/[place]/alm   Vital Signs:  Patient profile:   70 year old male Height:      69.5 inches Weight:      196 pounds BMI:     28.63 BSA:     2.06 O2 Sat:      98 % on Room air Temp:     98.1 degrees F Pulse rate:   85 / minute BP sitting:   152 / 78  Vitals Entered By: Jone Baseman CMA (August 09, 2009 2:33 PM)  O2 Flow:  Room air CC: SOB x 2 days Is Patient Diabetic? No Pain Assessment Patient in pain? no        Primary Care Provider:  Ancil Boozer  MD  CC:  SOB x 2 days.  History of Present Illness: 1) Dyspnea: Reports some dyspnea while he was lying in bed this morning as well as wheeze. Albuterol inhaler was empty. Reports non-productive cough as well as some mild nausea. Symptoms have mostly resolved. Denies fever, chest pain, sick contact, rhinorrhea, leg swelling, emesis, arm pain, jaw pain, pre-syncope.   Habits & Providers  Alcohol-Tobacco-Diet     Tobacco Status: never  Current Medications (verified): 1)  Albuterol 90 Mcg/act Aers (Albuterol) .... Inhale 1-2 Puff Using Inhaler Every Four Hours 2)  Aspirin Ec 81 Mg Tbec (Aspirin) .... Take 1 Tablet By Mouth Once A Day 3)  Hydrocodone-Acetaminophen 5-325 Mg Tabs (Hydrocodone-Acetaminophen) .Marland Kitchen.. 1-2 By Mouth Every 6 Hours As Needed For Your Very Worst Arthritis Pain, Do Not Drive After Taking 4)  Omeprazole 40 Mg Cpdr (Omeprazole) .Marland Kitchen.. 1 By Mouth Once Twice Daily For Reflux 5)  Lisinopril-Hydrochlorothiazide 20-12.5 Mg Tabs (Lisinopril-Hydrochlorothiazide) .Marland Kitchen.. 1 By Mouth Once Daily For Blood Pressure.  Replaces Old Lisinopril 6)  Atrovent Hfa 17 Mcg/act Aers (Ipratropium Bromide Hfa) .... 2 Puffs Inhaled Qid For Copd 7)  Miralax  Powd (Polyethylene Glycol 3350) .Marland Kitchen.. 1 Capful Twice Daily Until Stools Are Loose Then Once Daily Thereafter 8)  Dulcolax 10 Mg Supp (Bisacodyl) .... Insert One Suppository Once Daily Until Stools Are Loose Then As Needed To Keep 1 Regular Stool Daily  Allergies (verified): No  Known Drug Allergies  Social History: Smoking Status:  never  Review of Systems       as per HPI o/w negative   Physical Exam  General:  Well-developed,well-nourished,in no acute distress; alert,appropriate and cooperative throughout examination Neck:  no JVD  Lungs:  Normal respiratory effort, chest expands symmetrically. Lungs are clear to auscultation, no crackles or wheezes. Heart:  Normal rate and regular rhythm. S1 and S2 normal without gallop, murmur, click, rub or other extra sounds. Abdomen:  soft, non-tender, normal bowel sounds, no distention, no masses, and no guarding.   Extremities:  no edema  Neurologic:  alert & oriented X3.     Impression & Recommendations:  Problem # 1:  COPD (ICD-496)  Mild exacerbation at most. Symptoms appear to be resolving. Symptoms not worrisome for cardiac cause. Wil lrefill albuterol. Continue Atrovent. Follow up as needed. Red flags that would prompt return to care reviewed with patient.   His updated medication list for this problem includes:    Albuterol 90 Mcg/act Aers (Albuterol) ..... Inhale 1-2 puff using inhaler every four hours    Atrovent Hfa 17 Mcg/act Aers (Ipratropium bromide hfa) .Marland Kitchen... 2 puffs inhaled qid for copd  Orders: [facility]- Est Level  3 ([address])  Complete Medication List: 1)  Albuterol 90 Mcg/act Aers (Albuterol) .... Inhale 1-2 puff using inhaler every  four hours 2)  Aspirin Ec 81 Mg Tbec (Aspirin) .... Take 1 tablet by mouth once a day 3)  Hydrocodone-acetaminophen 5-325 Mg Tabs (Hydrocodone-acetaminophen) .Marland Kitchen.. 1-2 by mouth every 6 hours as needed for your very worst arthritis pain, do not drive after taking 4)  Omeprazole 40 Mg Cpdr (Omeprazole) .Marland Kitchen.. 1 by mouth once twice daily for reflux 5)  Lisinopril-hydrochlorothiazide 20-12.5 Mg Tabs (Lisinopril-hydrochlorothiazide) .Marland Kitchen.. 1 by mouth once daily for blood pressure.  replaces old lisinopril 6)  Atrovent Hfa 17 Mcg/act Aers (Ipratropium bromide hfa) .... 2 puffs  inhaled qid for copd 7)  Miralax Powd (Polyethylene glycol 3350) .Marland Kitchen.. 1 capful twice daily until stools are loose then once daily thereafter 8)  Dulcolax 10 Mg Supp (Bisacodyl) .... Insert one suppository once daily until stools are loose then as needed to keep 1 regular stool daily  Patient Instructions: 1)  Use your inhaler as needed for shortness of breath. 2)  If you start to have chest pain or worse shortnes of breath, come back in to see [place].  3)  Make an appointment to be seen by your new doctor in 3 months.  Prescriptions: HYDROCODONE-ACETAMINOPHEN 5-325 MG TABS (HYDROCODONE-ACETAMINOPHEN) 1-2 by mouth every 6 hours as needed for your very worst arthritis pain, do not drive after taking  #29 x 1   Entered and Authorized by:   Bobby Rumpf  MD   Signed by:   Bobby Rumpf  MD on 08/09/2009   Method used:   Print then Give to Patient   RxID:   [id number] ALBUTEROL 90 MCG/ACT AERS (ALBUTEROL) Inhale 1-2 puff using inhaler every four hours  #1 x 1   Entered and Authorized by:   Bobby Rumpf  MD   Signed by:   Bobby Rumpf  MD on 08/09/2009   Method used:   [facility]  [address] [phone][facility] (retail)       [address]       [place], [place]  [id number]       Ph: [phone] or [phone]       Fax: [phone]   RxID:   [id number]

## 2010-03-14 NOTE — Assessment & Plan Note (Signed)
Summary: FU/KH   Vital Signs:  Patient profile:   70 year old male Height:      69.5 inches Weight:      205.4 pounds BMI:     30.01 Temp:     98.6 degrees F oral Pulse rate:   103 / minute BP sitting:   111 / 72  (left arm) Cuff size:   regular  Vitals Entered By: Garen Grams LPN (May 25, 2009 9:35 AM) CC: f/u abdominal pain, arthritis Is Patient Diabetic? No Pain Assessment Patient in pain? no        Primary Care Provider:  Ancil Boozer  MD  CC:  f/u abdominal pain and arthritis.  History of Present Illness: abd pain: started using dulcolax and miralax as prescribed at last visit.  bowels started moving more regularly and less firm and since this has occured he has felt much better with his abdomen.  no longer having pain.    arthritis: was able to move up appt with dr truslow to this week.  continuing to have pain the worst in bilateral shoulders and knees but also some pain in bilateral hands.  denies fevers  Habits & Providers  Alcohol-Tobacco-Diet     Tobacco Status: quit  Current Medications (verified): 1)  Albuterol 90 Mcg/act Aers (Albuterol) .... Inhale 1-2 Puff Using Inhaler Every Four Hours 2)  Aspirin Ec 81 Mg Tbec (Aspirin) .... Take 1 Tablet By Mouth Once A Day 3)  Hydrocodone-Acetaminophen 5-325 Mg Tabs (Hydrocodone-Acetaminophen) .Marland Kitchen.. 1-2 By Mouth Every 6 Hours As Needed For Your Very Worst Arthritis Pain, Do Not Drive After Taking 4)  Omeprazole 40 Mg Cpdr (Omeprazole) .Marland Kitchen.. 1 By Mouth Once Twice Daily For Reflux 5)  Lisinopril-Hydrochlorothiazide 20-12.5 Mg Tabs (Lisinopril-Hydrochlorothiazide) .Marland Kitchen.. 1 By Mouth Once Daily For Blood Pressure.  Replaces Old Lisinopril 6)  Atrovent Hfa 17 Mcg/act Aers (Ipratropium Bromide Hfa) .... 2 Puffs Inhaled Qid For Copd 7)  Miralax  Powd (Polyethylene Glycol 3350) .Marland Kitchen.. 1 Capful Twice Daily Until Stools Are Loose Then Once Daily Thereafter 8)  Dulcolax 10 Mg Supp (Bisacodyl) .... Insert One Suppository Once Daily  Until Stools Are Loose Then As Needed To Keep 1 Regular Stool Daily  Allergies (verified): No Known Drug Allergies  Past History:  Past medical, surgical, family and social histories (including risk factors) reviewed for relevance to current acute and chronic problems.  Past Medical History: Reviewed history from 10/27/2007 and no changes required. h/o syncopal episode with negative cardiac work up [employer] - WNL EF 59% - 10/13/2004,  Cardiolite-WNL EF 65% 4/09 TTE 10/22/07 - poor image quality.  EF 55%, mildly calcified AV Carotid dopplers 10/22/07 - no significant ICA stenosis bilaterally, mid mixed plaque throughout bilaterally, vertebral artery flow antegrade bilaterally  Past Surgical History: Reviewed history from 05/12/2007 and no changes required. Cardiolite - WNL EF 59% - 10/13/2004, L Knee aspiration 04, 2/07 - 12/30/2002,  L knee steroid INJ 2/07 - 04/11/2005,  s/p fusion of vetebra in lower back - 12/30/2002 colonoscopy 10/07- tubulovillous adenoma polyps  Family History: Reviewed history from 07/30/2008 and no changes required. mother died at 57, father at 39, Mother with DM, No MI, No Stroke.  Doesn't remember father's cause of death  Social History: Reviewed history from 06/24/2007 and no changes required. Lives with wife; disabled secondary to back injury; smokes 1 pack q two days - Quit 10/2004; drinks a few beers daily.  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  currently exercises only 1-2 daily ,  walking about 1 mileSmoking Status:  quit  Review of Systems       per HPI  Physical Exam  General:  Well-developed,well-nourished,in no acute distress; alert,appropriate and cooperative throughout examination Lungs:  Normal respiratory effort, chest expands symmetrically. Lungs are clear to auscultation, no crackles or wheezes. Heart:  Normal rate and regular rhythm. S1 and S2 normal without gallop, murmur, click, rub or other extra sounds. Abdomen:   soft, non-tender, normal bowel sounds, no distention, no masses, and no guarding.   Msk:  pain bilateral shoulders and knees with movement.  mild swelling at bilateral knees and over MCPs bilateral hands   Impression & Recommendations:  Problem # 1:  ABDOMINAL PAIN, GENERALIZED (ICD-789.07) Assessment Improved  appears this was in fact 2/2 constipation.  monitor for recurrence. while on narcotics for arthritis need to continue stool softener and laxative  Orders: [facility]- Est Level  3 ([address])  Problem # 2:  POLYARTHRITIS (ICD-719.49) Assessment: Unchanged  appt moved up.  advised patient to be certain to keep this appt  Orders: [facility]- Est Level  3 ([address])  Complete Medication List: 1)  Albuterol 90 Mcg/act Aers (Albuterol) .... Inhale 1-2 puff using inhaler every four hours 2)  Aspirin Ec 81 Mg Tbec (Aspirin) .... Take 1 tablet by mouth once a day 3)  Hydrocodone-acetaminophen 5-325 Mg Tabs (Hydrocodone-acetaminophen) .Marland Kitchen.. 1-2 by mouth every 6 hours as needed for your very worst arthritis pain, do not drive after taking 4)  Omeprazole 40 Mg Cpdr (Omeprazole) .Marland Kitchen.. 1 by mouth once twice daily for reflux 5)  Lisinopril-hydrochlorothiazide 20-12.5 Mg Tabs (Lisinopril-hydrochlorothiazide) .Marland Kitchen.. 1 by mouth once daily for blood pressure.  replaces old lisinopril 6)  Atrovent Hfa 17 Mcg/act Aers (Ipratropium bromide hfa) .... 2 puffs inhaled qid for copd 7)  Miralax Powd (Polyethylene glycol 3350) .Marland Kitchen.. 1 capful twice daily until stools are loose then once daily thereafter 8)  Dulcolax 10 Mg Supp (Bisacodyl) .... Insert one suppository once daily until stools are loose then as needed to keep 1 regular stool daily  Patient Instructions: 1)  Try some over the counter eye drops such as visine (or generic equivalent) to help wash some of the irritants out of your eye.  IF this isn't helping at all after about 2 weeks please call your eye doctor to let them know. 2)  It was nice to see you today!
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
## 2010-03-14 NOTE — Assessment & Plan Note (Signed)
Summary: nauseated xs 2 days,tcb   Vital Signs:  Patient profile:   70 year old male Height:      70 inches Weight:      196.5 pounds BMI:     28.30 Temp:     99.4 degrees F BP sitting:   150 / 74  Vitals Entered By: Golden Circle RN (September 21, 2009 1:34 PM)  Primary Care Provider:  Alvia Grove DO   History of Present Illness: 27 to male with a few day history of nausea and vomtting.  Vomitted started on Saturday and continued thru the weekend and resolved on Monday.  Vomitting only present with solid food intake.  No blood in vomit. Denies diarrhea or constiaption.  Currently complains only of nausea; able to tolerate by mouth intake and keep it down.  Endorses tactile fevers, chills and night sweats on saturday that lasted only 24 hours; currently resolved.   No sick contacts.    Habits & Providers  Alcohol-Tobacco-Diet     Alcohol drinks/day: 3     Alcohol type: spirits     Tobacco Status: quit > 6 months  Exercise-Depression-Behavior     Seat Belt Use: always  Current Problems (verified): 1)  Nausea With Vomiting  (ICD-787.01) 2)  Hyperglycemia  (ICD-790.29) 3)  Hx of Palpitations, Recurrent  (ICD-785.1) 4)  Hx of Syncope  (ICD-780.2) 5)  Polyarthritis  (ICD-719.49) 6)  ? of Rheumatoid Arthritis  (ICD-714.0) 7)  Intention Tremor  (ICD-333.1) 8)  Elevated Prostate Specific Antigen  (ICD-790.93) 9)  Dizziness  (ICD-780.4) 10)  Hyperlipidemia  (ICD-272.4) 11)  COPD  (ICD-496) 12)  Chest Pain, Atypical  (ICD-786.59) 13)  Seborrheic Keratosis  (ICD-702.19) 14)  Tobacco Use, Quit  (ICD-V15.82) 15)  Migraine, Unspec., w/o Intractable Migraine  (ICD-346.90) 16)  Hypertension, Benign Systemic  (ICD-401.1) 17)  Gastroesophageal Reflux, No Esophagitis  (ICD-530.81)  Current Medications (verified): 1)  Albuterol 90 Mcg/act Aers (Albuterol) .... Inhale 1-2 Puff Using Inhaler Every Four Hours 2)  Aspirin Ec 81 Mg Tbec (Aspirin) .... Take 1 Tablet By Mouth Once A Day 3)   Hydrocodone-Acetaminophen 5-325 Mg Tabs (Hydrocodone-Acetaminophen) .Marland Kitchen.. 1-2 By Mouth Every 6 Hours As Needed For Your Very Worst Arthritis Pain, Do Not Drive After Taking 4)  Omeprazole 40 Mg Cpdr (Omeprazole) .Marland Kitchen.. 1 By Mouth Once Twice Daily For Reflux 5)  Lisinopril-Hydrochlorothiazide 20-12.5 Mg Tabs (Lisinopril-Hydrochlorothiazide) .Marland Kitchen.. 1 By Mouth Once Daily For Blood Pressure.  Replaces Old Lisinopril 6)  Atrovent Hfa 17 Mcg/act Aers (Ipratropium Bromide Hfa) .... 2 Puffs Inhaled Qid For Copd 7)  Miralax  Powd (Polyethylene Glycol 3350) .Marland Kitchen.. 1 Capful Twice Daily Until Stools Are Loose Then Once Daily Thereafter 8)  Dulcolax 10 Mg Supp (Bisacodyl) .... Insert One Suppository Once Daily Until Stools Are Loose Then As Needed To Keep 1 Regular Stool Daily 9)  Zofran 4 Mg/78ml Soln (Ondansetron Hcl) .... Use 4 Mg Every 8 Hours For Nausea.  Allergies (verified): No Known Drug Allergies  Social History: Risk analyst Use:  always Smoking Status:  quit > 6 months  Review of Systems  The patient denies anorexia, weight loss, syncope, and abdominal pain.    Physical Exam  General:  Vs reviewed, hypertensive, well-developed, well-nourished, and well-hydrated.   Mouth:  pharynx pink and moist.   Lungs:  Normal respiratory effort, chest expands symmetrically. Lungs are clear to auscultation, no crackles or wheezes. Heart:  Normal rate and regular rhythm. S1 and S2 normal without gallop, murmur, click, rub or other  extra sounds. Abdomen:  soft, non-tender, normal bowel sounds, no distention, no masses, and no guarding.   Extremities:  no edema  Neurologic:  alert & oriented X3.   Skin:  turgor normal and color normal.     Impression & Recommendations:  Problem # 1:  GASTROENTERITIS, VIRAL, ACUTE (ICD-008.8) likely acute cause of nausea and vomitting.  Pt had 24 hours of n/v, fevers and chills.  Currently resolved except for mild nausea.  Tolerating good by mouth intake.  Not dehydrated.  Good  UOP.   Continue fluid intake, RTC if vomitting returns or decreased by mouth intake.  Complete Medication List: 1)  Albuterol 90 Mcg/act Aers (Albuterol) .... Inhale 1-2 puff using inhaler every four hours 2)  Aspirin Ec 81 Mg Tbec (Aspirin) .... Take 1 tablet by mouth once a day 3)  Hydrocodone-acetaminophen 5-325 Mg Tabs (Hydrocodone-acetaminophen) .Marland Kitchen.. 1-2 by mouth every 6 hours as needed for your very worst arthritis pain, do not drive after taking 4)  Omeprazole 40 Mg Cpdr (Omeprazole) .Marland Kitchen.. 1 by mouth once twice daily for reflux 5)  Lisinopril-hydrochlorothiazide 20-12.5 Mg Tabs (Lisinopril-hydrochlorothiazide) .Marland Kitchen.. 1 by mouth once daily for blood pressure.  replaces old lisinopril 6)  Atrovent Hfa 17 Mcg/act Aers (Ipratropium bromide hfa) .... 2 puffs inhaled qid for copd 7)  Miralax Powd (Polyethylene glycol 3350) .Marland Kitchen.. 1 capful twice daily until stools are loose then once daily thereafter 8)  Dulcolax 10 Mg Supp (Bisacodyl) .... Insert one suppository once daily until stools are loose then as needed to keep 1 regular stool daily 9)  Zofran 4 Mg/40ml Soln (Ondansetron hcl) .... Use 4 mg every 8 hours for nausea.  Other Orders: [facility]- Est Level  3 (16109)  Patient Instructions: 1)  Nice to meet you! 2)  I have sent Zofran to the pharmacy for your nausea, take it for up to 1 week.  If you are still sick in 1 week, please come back to the office.  3)  Please schedule a follow-up appointment in 3 months .  Prescriptions: HYDROCODONE-ACETAMINOPHEN 5-325 MG TABS (HYDROCODONE-ACETAMINOPHEN) 1-2 by mouth every 6 hours as needed for your very worst arthritis pain, do not drive after taking  #60 x 0   Entered and Authorized by:   Alvia Grove DO   Signed by:   Alvia Grove DO on 09/21/2009   Method used:   Handwritten   RxID:   [id number] ZOFRAN 4 MG/5ML SOLN (ONDANSETRON HCL) Use 4 mg every 8 hours for nausea.  #80mg  x 0   Entered and Authorized by:   Alvia Grove DO   Signed by:    Alvia Grove DO on 09/21/2009   Method used:   [facility]  [address] [phone]* (retail)       [address]       [place], [place]  [address]       Ph: [phone] or [phone]       Fax: [phone]   RxID:   [id number] LISINOPRIL-HYDROCHLOROTHIAZIDE 20-12.5 MG TABS (LISINOPRIL-HYDROCHLOROTHIAZIDE) 1 by mouth once daily for blood pressure.  replaces old lisinopril  #32 x 6   Entered and Authorized by:   Alvia Grove DO   Signed by:   Alvia Grove DO on 09/21/2009   Method used:   [facility]        [facility]  [address] [phone]* (retail)  [address]       [place], [place]  [address]       Ph: [phone] or [phone]       Fax: [phone]   RxID:   [id number]
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
## 2010-03-14 NOTE — Consult Note (Signed)
Summary: [facility]   Imported By: Bradly Bienenstock 04/25/2009 15:58:28  _____________________________________________________________________  External Attachment:    Type:   Image     Comment:   External Document

## 2010-03-14 NOTE — Letter (Signed)
Summary: Spiriva --> Atrovent  [facility]  [address]   [place], [place] [address]   Phone: [phone]  Fax: [phone]    03/16/2009  Joel Donaldson [address] [place], [place]  [address]  Dear [name],  I recieved notice that your insurance company will not pay for Spiriva.  I have substituted this with Atrovent, a different medication from the same class of drugs.  Unfortunately, you will need to use this medicine 4 times per day rather than once daily.  [facility]  Sincerely, Joel Belling MD  Appended Document: Spiriva --> Atrovent mailed.  Appended Document: Spiriva --> Atrovent mailed.

## 2010-03-14 NOTE — Miscellaneous (Signed)
Summary: Spiriva Denied by [facility] -- Substitute Atrovent  Prescriptions: ATROVENT HFA 17 MCG/ACT AERS (IPRATROPIUM BROMIDE HFA) 2 puffs inhaled qid for COPD  #1 x 1   Entered and Authorized by:   Romero Belling MD   Signed by:   Romero Belling MD on 03/16/2009   Method used:   [facility]        [facility]  [address] [phone]* (retail)       [address]       [place], [place]  [address]       Ph: [phone] or [phone]       Fax: [phone]   RxID:   [id number]

## 2010-03-14 NOTE — Miscellaneous (Signed)
Summary: needs appt for paperwork   Clinical Lists Changes we have forms & need a letter done by md. his pcp is out for the next 2 months. will schedule him with another blue team md when he calls back.Golden Circle RN  February 24, 2009 2:20 PM  spoke with pt & scheduled him with Dr. Constance Goltz next wed at 11am. the forms are in md chart box.Golden Circle RN  February 25, 2009 9:50 AM ..Golden Circle RN  February 25, 2009 9:44 AM

## 2010-03-14 NOTE — Progress Notes (Signed)
Summary: Rx Req   Phone Note Refill Request Call back at Home Phone [phone] Message from:  Patient  Refills Requested: Medication #1:  HYDROCODONE-ACETAMINOPHEN 5-325 MG TABS 1-2 by mouth every 6 hours as needed for your very worst arthritis pain PLEASE CALL WHEN READY TO PICK UP.  Initial call taken by: Clydell Hakim,  February 22, 2009 10:09 AM  Follow-up for Phone Call        will forward to MD. Follow-up by: Theresia Lo RN,  February 22, 2009 10:12 AM  Additional Follow-up for Phone Call Additional follow up Details #1::        okay to fill for #90, no refills Additional Follow-up by: Ancil Boozer  MD,  February 22, 2009 12:00 PM    Prescriptions: HYDROCODONE-ACETAMINOPHEN 5-325 MG TABS (HYDROCODONE-ACETAMINOPHEN) 1-2 by mouth every 6 hours as needed for your very worst arthritis pain, do not drive after taking  #81 x 0   Entered and Authorized by:   Ancil Boozer  MD   Signed by:   Ancil Boozer  MD on 02/23/2009   Method used:   Telephoned to ...       [facility]  [address] [phone]* (retail)       [address]       [place], [place]  [address]       Ph: [phone] or [phone]       Fax: [phone]   RxID:   [id number]  RX [facility] because [facility] [address] did not have in stock. patient notified. Theresia Lo RN  February 22, 2009 1:51 PM

## 2010-03-14 NOTE — Progress Notes (Signed)
Summary: triage  Medications Added ALBUTEROL 90 MCG/ACT AERS (ALBUTEROL) Inhale 1-2 puff using inhaler every four hours ASPIRIN EC 81 MG TBEC (ASPIRIN) Take 1 tablet by mouth once a day LISINOPRIL-HYDROCHLOROTHIAZIDE 20-25 MG TABS (LISINOPRIL-HYDROCHLOROTHIAZIDE) Take 1 tablet by mouth once a day LISINOPRIL-HYDROCHLOROTHIAZIDE 20-25 MG TABS (LISINOPRIL-HYDROCHLOROTHIAZIDE) Take 1 tablet by mouth once a day MECLIZINE HCL 25 MG TABS (MECLIZINE HCL) Take 1 tablet by mouth every six hours MECLIZINE HCL 25 MG TABS (MECLIZINE HCL) Take 1 tablet by mouth every six hours PROTONIX 40 MG  TBEC (PANTOPRAZOLE SODIUM) once daily PRILOSEC OTC 20 MG TBEC (OMEPRAZOLE MAGNESIUM) 2 tablet by mouth twice a day PROTONIX 40 MG  TBEC (PANTOPRAZOLE SODIUM) once daily SPIRIVA HANDIHALER 18 MCG CAPS (TIOTROPIUM BROMIDE MONOHYDRATE) Inhale 1 capsule by mouth once a day SPIRIVA HANDIHALER 18 MCG CAPS (TIOTROPIUM BROMIDE MONOHYDRATE) Inhale 1 capsule by mouth once a day LISINOPRIL 10 MG TABS (LISINOPRIL) 1 by mouth once daily LISINOPRIL 10 MG TABS (LISINOPRIL) 1 by mouth once daily LISINOPRIL-HYDROCHLOROTHIAZIDE 10-12.5 MG  TABS (LISINOPRIL-HYDROCHLOROTHIAZIDE) 1 by mouth daily TRAMADOL HCL 50 MG  TABS (TRAMADOL HCL) 1or2 two times a day  prn TRAMADOL HCL 50 MG  TABS (TRAMADOL HCL) 1or2 two times a day  prn VICODIN 5-500 MG TABS (HYDROCODONE-ACETAMINOPHEN) 1 by mouth every 12 hrs as needed VICODIN 5-500 MG TABS (HYDROCODONE-ACETAMINOPHEN) 1 by mouth every 12 hrs as needed ALEVE 220 MG  TABS (NAPROXEN SODIUM) as needed for arthritis pain ALEVE 220 MG  TABS (NAPROXEN SODIUM) as needed for arthritis pain TRAMADOL HCL 50 MG  TABS (TRAMADOL HCL) 1 by mouth q 4-6 hrs prn TRAMADOL HCL 50 MG  TABS (TRAMADOL HCL) 1 by mouth q 4-6 hrs prn TAMIFLU 75 MG  CAPS (OSELTAMIVIR PHOSPHATE) 1 by mouth two times a day x 5 days TAMIFLU 75 MG  CAPS (OSELTAMIVIR PHOSPHATE) 1 by mouth two times a day x 5 days HYDROCODONE-ACETAMINOPHEN 5-325  MG TABS (HYDROCODONE-ACETAMINOPHEN) 1-2 by mouth every 6 hours as needed for your very worst arthritis pain, do not drive after taking HYDROCODONE-ACETAMINOPHEN 5-325 MG TABS (HYDROCODONE-ACETAMINOPHEN) 1-2 by mouth every 6 hours as needed for your very worst arthritis pain, do not drive after taking HYDROCODONE-ACETAMINOPHEN 5-325 MG TABS (HYDROCODONE-ACETAMINOPHEN) 1-2 by mouth every 6 hours as needed for your very worst arthritis pain, do not drive after taking OMEPRAZOLE 40 MG CPDR (OMEPRAZOLE) 1 by mouth once twice daily for reflux (pharmacist please disregard previous script) * OMEPRAZOLE 40 MG take one tablet twice daily OMEPRAZOLE 40 MG CPDR (OMEPRAZOLE) 1 by mouth once daily for reflux OMEPRAZOLE 40 MG CPDR (OMEPRAZOLE) 1 by mouth once twice daily for reflux LISINOPRIL 20 MG TABS (LISINOPRIL) Take one tablet daily for blood pressure LISINOPRIL 20 MG TABS (LISINOPRIL) Take one tablet daily for blood pressure LISINOPRIL-HYDROCHLOROTHIAZIDE 20-12.5 MG TABS (LISINOPRIL-HYDROCHLOROTHIAZIDE) 1 by mouth once daily for blood pressure.  replaces old lisinopril CIPRO 500 MG TABS (CIPROFLOXACIN HCL) 1 by mouth two times a day per urologist CIPRO 500 MG TABS (CIPROFLOXACIN HCL) 1 by mouth two times a day per urologist INDERAL LA 60 MG XR24H-CAP (PROPRANOLOL HCL) 1 by mouth once daily for blood pressure and headache control INDERAL LA 60 MG XR24H-CAP (PROPRANOLOL HCL) 1 by mouth once daily for blood pressure and headache control PREDNISONE 20 MG TABS (PREDNISONE) per Dr Kellie Simmering PREDNISONE 20 MG TABS (PREDNISONE) per Dr Kellie Simmering FLEXERIL 10 MG TABS (CYCLOBENZAPRINE HCL) 1 by mouth two times a day as needed muscle spasms FLEXERIL 10 MG TABS (CYCLOBENZAPRINE HCL) 1 by mouth  two times a day as needed muscle spasms IBUPROFEN 600 MG TABS (IBUPROFEN) 1 by mouth q6hr as needed pain IBUPROFEN 600 MG TABS (IBUPROFEN) 1 by mouth q6hr as needed pain ATROVENT HFA 17 MCG/ACT AERS (IPRATROPIUM BROMIDE HFA) 2 puffs  inhaled qid for COPD MIRALAX  POWD (POLYETHYLENE GLYCOL 3350) 1 capful twice daily until stools are loose then once daily thereafter DULCOLAX 10 MG SUPP (BISACODYL) insert one suppository once daily until stools are loose then as needed to keep 1 regular stool daily       Phone Note Call from Patient   Caller: Patient Summary of Call: pt is SOB since 2 AM Initial call taken by: De Nurse,  August 09, 2009 10:06 AM  Follow-up for Phone Call        started a yr ago. happens off & on.  this episode started at 2am. sleeps on 2 pillows. took his inhaler. not helpful. somewhat SOB on phone. states he is still able to walk around & talk. put in wi clinic. aware there may be a wait Follow-up by: Golden Circle RN,  August 09, 2009 10:11 AM

## 2010-03-14 NOTE — Consult Note (Signed)
[facility]: [facility]   Imported By: Bradly Bienenstock 05/03/2009 11:04:29  _____________________________________________________________________  External Attachment:    Type:   Image     Comment:   External Document

## 2010-03-14 NOTE — Progress Notes (Signed)
Summary: Rx Req   Phone Note Refill Request Call back at Home Phone [phone] Message from:  Patient  Refills Requested: Medication #1:  HYDROCODONE-ACETAMINOPHEN 5-325 MG TABS 1-2 by mouth every 6 hours as needed for your very worst arthritis pain  Medication #2:  ALBUTEROL 90 MCG/ACT AERS Inhale 1-2 puff using inhaler every four hours Initial call taken by: Clydell Hakim,  November 21, 2009 9:05 AM  Follow-up for Phone Call        pt called again - he is out of his meds. Follow-up by: De Nurse,  November 22, 2009 9:57 AM  Additional Follow-up for Phone Call Additional follow up Details #1::        I texted pcp Additional Follow-up by: Golden Circle RN,  November 22, 2009 10:20 AM    Additional Follow-up for Phone Call Additional follow up Details #2::    pcp called back & ok'd #90 of the 5/325 hydrocodone with no refills. will ask another md to write.Golden Circle RN  November 22, 2009 10:36 AM  Follow-up by: Golden Circle RN,  November 22, 2009 10:35 AM  Additional Follow-up for Phone Call Additional follow up Details #3:: Details for Additional Follow-up Action Taken: [facility]  Hydrocodone handwritten and will leave up front for pt. Additional Follow-up by: Sarah Swaziland MD,  November 22, 2009 10:43 AM  Prescriptions: ALBUTEROL 90 MCG/ACT AERS (ALBUTEROL) Inhale 1-2 puff using inhaler every four hours  #1 x 1   Entered and Authorized by:   Sarah Swaziland MD   Signed by:   Sarah Swaziland MD on 11/22/2009   Method used:   [facility]        [facility] [address] [phone]* (retail)       [address]       [place], [place]  [id number]       Ph: [phone] or [phone]       Fax: [phone]   RxID:   [id number] HYDROCODONE-ACETAMINOPHEN 5-325 MG TABS (HYDROCODONE-ACETAMINOPHEN) 1-2 by mouth every 6 hours as needed for your very worst arthritis pain, do not drive after taking  #71 x 0   Entered and Authorized  by:   Sarah Swaziland MD   Signed by:   Sarah Swaziland MD on 11/22/2009   Method used:   Handwritten   RxID:   [id number]  no answer. when he calls back, tell him rx is ready for pick up.Golden Circle RN  November 22, 2009 3:01 PM

## 2010-03-14 NOTE — Consult Note (Signed)
Summary: [facility]   Imported By: De Nurse 01/19/2010 16:06:55  _____________________________________________________________________  External Attachment:    Type:   Image     Comment:   External Document

## 2010-03-14 NOTE — Miscellaneous (Signed)
Summary: patient summary   Clinical Lists Changes  Problems: Removed problem of ABDOMINAL PAIN, GENERALIZED (ICD-789.07) Changed problem from SYNCOPE (ICD-780.2) to History of  SYNCOPE (ICD-780.2) Changed problem from PALPITATIONS, RECURRENT (ICD-785.1) to History of  PALPITATIONS, RECURRENT (ICD-785.1) Observations: Added new observation of SOCIAL HX: Lives with wife; disabled secondary to back injury; smokes 1 pack q two days - Quit 10/2004; drinks a few beers daily.  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  currently exercises only 1-2 daily , walking about 1 mile   (07/21/2009 15:01) Added new observation of PAST MED HX: h/o syncopal episode with negative cardiac work up [employer] - WNL EF 59% - 10/13/2004, Cardiolite-WNL EF 65% 4/09 TTE 10/22/07 - poor image quality.  EF 55%, mildly calcified AV Carotid dopplers 10/22/07 - no significant ICA stenosis bilaterally, mid mixed plaque throughout bilaterally, vertebral artery flow antegrade bilaterally h/o HYPERGLYCEMIA (ICD-790.29) h/o PALPITATIONS, RECURRENT (ICD-785.1) POLYARTHRITIS (ICD-719.49)     ? of RHEUMATOID ARTHRITIS (ICD-714.0) - seen by Dr Kellie Simmering of Rhemumatology INTENTION TREMOR (ICD-333.1) ELEVATED PROSTATE SPECIFIC ANTIGEN (ICD-790.93) - [facility] HYPERLIPIDEMIA (ICD-272.4) COPD (ICD-496)       TOBACCO USE, QUIT (ICD-V15.82) MIGRAINE, UNSPEC., W/O INTRACTABLE MIGRAINE (ICD-346.90) HYPERTENSION, BENIGN SYSTEMIC (ICD-401.1) GASTROESOPHAGEAL REFLUX, NO ESOPHAGITIS (ICD-530.81)   (07/21/2009 15:01)     Past, Family, and Social History Past History: h/o syncopal episode with negative cardiac work up [employer] - WNL EF 59% - 10/13/2004, Cardiolite-WNL EF 65% 4/09 TTE 10/22/07 - poor image quality.  EF 55%, mildly calcified AV Carotid dopplers 10/22/07 - no significant ICA stenosis bilaterally, mid mixed plaque throughout bilaterally, vertebral artery flow antegrade bilaterally h/o  HYPERGLYCEMIA (ICD-790.29) h/o PALPITATIONS, RECURRENT (ICD-785.1) POLYARTHRITIS (ICD-719.49)     ? of RHEUMATOID ARTHRITIS (ICD-714.0) - seen by Dr Kellie Simmering of Rhemumatology INTENTION TREMOR (ICD-333.1) ELEVATED PROSTATE SPECIFIC ANTIGEN (ICD-790.93) - [facility] HYPERLIPIDEMIA (ICD-272.4) COPD (ICD-496)       TOBACCO USE, QUIT (ICD-V15.82) MIGRAINE, UNSPEC., W/O INTRACTABLE MIGRAINE (ICD-346.90) HYPERTENSION, BENIGN SYSTEMIC (ICD-401.1) GASTROESOPHAGEAL REFLUX, NO ESOPHAGITIS (ICD-530.81)   Social History: Lives with wife; disabled secondary to back injury; smokes 1 pack q two days - Quit 10/2004; drinks a few beers daily.  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  currently exercises only 1-2 daily , walking about 1 mile

## 2010-03-14 NOTE — Consult Note (Signed)
Summary: Truslow,MD  Truslow,MD   Imported By: Bradly Bienenstock 02/25/2009 15:07:49  _____________________________________________________________________  External Attachment:    Type:   Image     Comment:   External Document

## 2010-03-14 NOTE — Letter (Signed)
Summary: Lipid Letter  [facility]  [address]   [place], [place] [address]   Phone: [phone]  Fax: [phone]    05/16/2009  [facility] [address] [place], [place]  [address]  Dear Joel [facility]:  We have carefully reviewed your last lipid profile from 05/13/2009 and the results are noted below with a summary of recommendations for lipid management.    Cholesterol:       210     Goal: < 200   HDL "good" Cholesterol:   46     Goal: > 40   LDL "bad" Cholesterol:   145     Goal: < 130   Triglycerides:       95     Goal: < 150    Overall these numbers are okay. Below are some ideas to keep them that way.  We can discuss this further at your next appointment.  Your other tests all looked okay.     TLC Diet (Therapeutic Lifestyle Change): Saturated Fats & Transfatty acids should be kept < 7% of total calories ***Reduce Saturated Fats Polyunstaurated Fat can be up to 10% of total calories Monounsaturated Fat Fat can be up to 20% of total calories Total Fat should be no greater than 25-35% of total calories Carbohydrates should be 50-60% of total calories Protein should be approximately 15% of total calories Fiber should be at least 20-30 grams a day ***Increased fiber may help lower LDL Total Cholesterol should be < 200mg /day Consider adding plant stanol/sterols to diet (example: Benacol spread) ***A higher intake of unsaturated fat may reduce Triglycerides and Increase HDL    Adjunctive Measures (may lower LIPIDS and reduce risk of Heart Attack) include: Aerobic Exercise (20-30 minutes 3-4 times a week) Limit Alcohol Consumption Weight Reduction Aspirin 75-81 mg a day by mouth (if not allergic or contraindicated) Dietary Fiber 20-30 grams a day by mouth     Current Medications: 1)    Albuterol 90 Mcg/act Aers (Albuterol) .... Inhale 1-2 puff using inhaler every four hours 2)    Aspirin Ec 81 Mg Tbec (Aspirin) .... Take 1 tablet by mouth  once a day 3)    Hydrocodone-acetaminophen 5-325 Mg Tabs (Hydrocodone-acetaminophen) .Marland Kitchen.. 1-2 by mouth every 6 hours as needed for your very worst arthritis pain, do not drive after taking 4)    Omeprazole 40 Mg Cpdr (Omeprazole) .Marland Kitchen.. 1 by mouth once twice daily for reflux 5)    Lisinopril-hydrochlorothiazide 20-12.5 Mg Tabs (Lisinopril-hydrochlorothiazide) .Marland Kitchen.. 1 by mouth once daily for blood pressure.  replaces old lisinopril 6)    Atrovent Hfa 17 Mcg/act Aers (Ipratropium bromide hfa) .... 2 puffs inhaled qid for copd 7)    Miralax  Powd (Polyethylene glycol 3350) .Marland Kitchen.. 1 capful twice daily until stools are loose then once daily thereafter 8)    Dulcolax 10 Mg Supp (Bisacodyl) .... Insert one suppository once daily until stools are loose then as needed to keep 1 regular stool daily  If you have any questions, please call. We appreciate being able to work with you.   Sincerely,    [facility] Joel Boozer  MD  Appended Document: Lipid Letter mailed.

## 2010-03-15 ENCOUNTER — Encounter: Payer: Self-pay | Admitting: Family Medicine

## 2010-03-15 ENCOUNTER — Ambulatory Visit (INDEPENDENT_AMBULATORY_CARE_PROVIDER_SITE_OTHER): Payer: MEDICARE | Admitting: Family Medicine

## 2010-03-15 DIAGNOSIS — N3000 Acute cystitis without hematuria: Secondary | ICD-10-CM

## 2010-03-16 NOTE — Assessment & Plan Note (Signed)
Summary: arm pain,df   Vital Signs:  Patient profile:   70 year old male Height:      70 inches Weight:      194.3 pounds BMI:     27.98 Temp:     98.2 degrees F oral Pulse rate:   91 / minute BP sitting:   182 / 85  (left arm) Cuff size:   regular  Vitals Entered By: Jimmy Footman, CMA (March 03, 2010 2:40 PM) CC: rt arm pain x 1 month Is Patient Diabetic? No   Primary Provider:  Priscella Mann MD  CC:  rt arm pain x 1 month.  History of Present Illness: Pt presents with the history of approximately 1.5 months of right arm weakness and pain.  The weakness and pain began on Dec 5th when he had his blood drawn from his right anticubital fossa.  He says the plebotamist did a fair amount of "digging" and he experienced a sharp pain and immedicate muscle weakness.  This has not resolved since that time.  He has significant problems with performing daily activities due to this weakness and is routinely dropping things from his right hand.  He says his left arm is unaffected.  He has a throbbing pain almost all the time throughout his right forearm.  He has been taking naproxon two times a day with little relief.  Preventive Screening-Counseling & Management  Alcohol-Tobacco     Smoking Status: quit  Allergies: No Known Drug Allergies  Past History:  Past medical, surgical, family and social histories (including risk factors) reviewed for relevance to current acute and chronic problems.  Past Medical History: Reviewed history from 07/21/2009 and no changes required. h/o syncopal episode with negative cardiac work up [employer] - WNL EF 59% - 10/13/2004, Cardiolite-WNL EF 65% 4/09 TTE 10/22/07 - poor image quality.  EF 55%, mildly calcified AV Carotid dopplers 10/22/07 - no significant ICA stenosis bilaterally, mid mixed plaque throughout bilaterally, vertebral artery flow antegrade bilaterally h/o HYPERGLYCEMIA (ICD-790.29) h/o PALPITATIONS, RECURRENT (ICD-785.1) POLYARTHRITIS  (ICD-719.49)     ? of RHEUMATOID ARTHRITIS (ICD-714.0) - seen by Dr Kellie Simmering of Rhemumatology INTENTION TREMOR (ICD-333.1) ELEVATED PROSTATE SPECIFIC ANTIGEN (ICD-790.93) - [facility] HYPERLIPIDEMIA (ICD-272.4) COPD (ICD-496)       TOBACCO USE, QUIT (ICD-V15.82) MIGRAINE, UNSPEC., W/O INTRACTABLE MIGRAINE (ICD-346.90) HYPERTENSION, BENIGN SYSTEMIC (ICD-401.1) GASTROESOPHAGEAL REFLUX, NO ESOPHAGITIS (ICD-530.81)  Past Surgical History: Reviewed history from 05/12/2007 and no changes required. Cardiolite - WNL EF 59% - 10/13/2004, L Knee aspiration 04, 2/07 - 12/30/2002,  L knee steroid INJ 2/07 - 04/11/2005,  s/p fusion of vetebra in lower back - 12/30/2002 colonoscopy 10/07- tubulovillous adenoma polyps  Family History: Reviewed history from 07/30/2008 and no changes required. mother died at 31, father at 50, Mother with DM, No MI, No Stroke.  Doesn't remember father's cause of death  Social History: Reviewed history from 01/19/2010 and no changes required. Lives with wife; disabled secondary to back injury; smokes 1 pack q two days - Quit 10/2004; drinks a pint of whiskey weekly.  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   currently exercises only 1-2 daily , walking about 1 mile  Review of Systems  The patient denies anorexia, fever, weight loss, weight gain, vision loss, decreased hearing, hoarseness, chest pain, syncope, dyspnea on exertion, peripheral edema, prolonged cough, headaches, hemoptysis, abdominal pain, melena, hematochezia, and severe indigestion/heartburn.    Physical Exam  General:  Vs reviewed, hypertensive, well-developed, well-nourished, and well-hydrated.   Lungs:  Normal respiratory effort, chest expands symmetrically. Lungs are clear to auscultation, no crackles or wheezes. Heart:  Normal rate and regular rhythm. S1 and S2 normal without gallop, murmur, click, rub or other extra sounds. Extremities:  4+ strength right forearm,  hand, arm, and shoulder, 5+ strength throughout rest of body.  Sensation preserved to light touch.  Pain on palpation of ac-fossa, right anterior forarm.   Impression & Recommendations:  Problem # 1:  WEAKNESS (ICD-780.79) Presence of weakness throughout the entire right arm/shoulder suggests an etiology other than phlebotomy.  Would consider nerve root impingement vs acute brachial plexus neruitis vs actual ulnar nerve injury.  Will give tramadol and tylenol to try to control pain better and obtain C-spine xray and EMG/nerve conduction studies to better illucidate where the problem is.  Pt to schedule an appointment 3-4 days following conclusion of all studies.  Orders: Neurology Referral (Neuro) Diagnostic X-Ray/Fluoroscopy (Diagnostic X-Ray/Flu) [facility]- Est Level  3 ([address])  Complete Medication List: 1)  Albuterol 90 Mcg/act Aers (Albuterol) .... Inhale 1-2 puff using inhaler every four hours 2)  Aspirin Ec 81 Mg Tbec (Aspirin) .... Take 1 tablet by mouth once a day 3)  Hydrocodone-acetaminophen 5-325 Mg Tabs (Hydrocodone-acetaminophen) .Marland Kitchen.. 1-2 by mouth every 6 hours as needed for your very worst arthritis pain, do not drive after taking 4)  Lisinopril-hydrochlorothiazide 20-12.5 Mg Tabs (Lisinopril-hydrochlorothiazide) .Marland Kitchen.. 1 by mouth once daily for blood pressure.  replaces old lisinopril 5)  Atrovent Hfa 17 Mcg/act Aers (Ipratropium bromide hfa) .... 2 puffs inhaled qid for copd 6)  Ibuprofen 600 Mg Tabs (Ibuprofen) .... Take 1 pill by mouth every 8 hours for 2 weeks 7)  Tramadol Hcl 50 Mg Tabs (Tramadol hcl) .... Take 1-2 pills every six hours as needed for pain  Patient Instructions: 1)  I am glad to see you today!  Sorry your arm hurts. 2)  We aren't exactly sure what is causing your arm weakness but would like to get some tests to figure out what is causing it. 3)  I would like you to get an xray of your neck and studies to see how your nerves are doing.  You can get the x-ray today  or Monday.  You will be called with for the nerve studies (called nerve conduction studies). 4)  I am giving you a prescription for a pain medicine for you to take every six hours.  I would also like you to take tylenol, 500 or 650mg  three times per day. Prescriptions: TRAMADOL HCL 50 MG TABS (TRAMADOL HCL) Take 1-2 pills every six hours as needed for pain  #60 x 0   Entered and Authorized by:   Majel Homer MD   Signed by:   Majel Homer MD on 03/06/2010   Method used:   [facility] [address] [phone][facility] (retail)       [address]       [place], [place]  [id number]       Ph: [phone] or [phone]       Fax: [phone]   RxID:   [id number]    Orders Added: 1)  Neurology Referral [Neuro] 2)  Diagnostic X-Ray/Fluoroscopy [Diagnostic X-Ray/Flu] 3)  [facility]- Est Level  3 [[address]]    Prevention & Chronic Care Immunizations   Influenza vaccine: Not documented    Tetanus booster: 01/13/2003: Done.   Tetanus booster due: 01/12/2013  Pneumococcal vaccine: Done.  (10/13/2005)   Pneumococcal vaccine due: None    H. zoster vaccine: Not documented  Colorectal Screening   Hemoccult: negative  (03/06/2007)   Hemoccult due: Not Indicated    Colonoscopy: polyps  (04/25/2007)   Colonoscopy action/deferral: Repeat colonoscopy in 3 years.     (04/25/2007)   Colonoscopy due: 04/25/2010  Other Screening   PSA: 3.27  (06/28/2008)   PSA due due: 03/2008   Smoking status: quit  (03/03/2010)  Lipids   Total Cholesterol: 210  (05/13/2009)   LDL: 145  (05/13/2009)   LDL Direct: 99  (06/24/2007)   HDL: 46  (05/13/2009)   Triglycerides: 95  (05/13/2009)    SGOT (AST): 15  (05/13/2009)   SGPT (ALT): 16  (05/13/2009)   Alkaline phosphatase: 48  (05/13/2009)   Total bilirubin: 0.5  (05/13/2009)  Hypertension   Last Blood Pressure: 182 / 85  (03/03/2010)   Serum creatinine: 0.91  (05/13/2009)   Serum potassium 4.4   (05/13/2009)  Self-Management Support :   Personal Goals (by the next clinic visit) :      Personal blood pressure goal: 140/90  (05/13/2009)     Personal LDL goal: 130  (05/13/2009)    Hypertension self-management support: Not documented    Hypertension self-management support not done because: Good outcomes  (05/13/2009)    Lipid self-management support: Not documented     Lipid self-management support not done because: Good outcomes  (05/13/2009)

## 2010-03-16 NOTE — Progress Notes (Signed)
   Phone Note Outgoing Call   Call placed by: Jimmy Footman, CMA,  March 07, 2010 3:45 PM Call placed to: Patient Summary of Call: Received fax from [facility] that pt has an appt on 03/27/2010 @ 1pm. Spoke with pt's family member and informed of appt

## 2010-03-16 NOTE — Letter (Signed)
Summary: Generic Letter  [facility]  [address]   [place], [place] [address]   Phone: [phone]  Fax: [phone]    03/09/2010  [address]46 [address] [place], [place]  [address]  Dear Joel Donaldson,  We are happy to let you know that since you are covered under Medicare you are able to have a FREE visit at the [facility] to discuss your HEALTH. This is a new benefit for Medicare.  There will be no co-payment.  At this visit you will meet with Joel Donaldson an expert in wellness and the health coach at our clinic.  At this visit we will discuss ways to keep you healthy and feeling well.  This visit will not replace your regular doctor visit and we cannot refill medications.     You will need to plan to be here at least one hour to talk about your medical history, your current status, review all of your medications, and discuss your future plans for your health.  This information will be entered into your record for your doctor to have and review.  If you are interested in staying healthy, this type of visit can help.  Please call the office at: [phone], to schedule a "Medicare Wellness Visit".  The day of the visit you should bring in all of your medications, including any vitamins, herbs, over the counter products you take.  Make a list of all the other doctors that you see, so we know who they are. If you have any other health documents please bring them.  We look forward to helping you stay healthy.  Sincerely,   [facility]  [facility]  Appended Document: ED Visit Consultation Pt c/o penile discharge, noted translucent discharge, swabbed this for GC/Chlam, will follow this. Pt already given CTX 1 g IM in ED. Rodney Langton MD  March 11, 2010 12:23 PM    Medications Added KEFLEX 500 MG CAPS (CEPHALEXIN) One tab by mouth two times a day x 7d PHENAZOPYRIDINE HCL 200 MG TABS (PHENAZOPYRIDINE HCL) One tab  by mouth three times a day after meals for 2d          Clinical Lists Changes  Medications: Added new medication of KEFLEX 500 MG CAPS (CEPHALEXIN) One tab by mouth two times a day x 7d - Signed Added new medication of PHENAZOPYRIDINE HCL 200 MG TABS (PHENAZOPYRIDINE HCL) One tab by mouth three times a day after meals for 2d - Signed Rx of KEFLEX 500 MG CAPS (CEPHALEXIN) One tab by mouth two times a day x 7d;  #14 x 0;  Signed;  Entered by: Rodney Langton MD;  Authorized by: Rodney Langton MD;  Method [facility]: [facility] [address] [phone]*, [address], [place], [place], [place]  [address], Ph: [phone] or [phone], Fax: [phone] Rx of PHENAZOPYRIDINE HCL 200 MG TABS (PHENAZOPYRIDINE HCL) One tab by mouth three times a day after meals for 2d;  #6 x 0;  Signed;  Entered by: Rodney Langton MD;  Authorized by: Rodney Langton MD;  Method used: [facility]ically to [facility] [address] [phone]*, [address] [place], [place], [place]  [address], Ph: [phone] or [phone], Fax: [phone]    Prescriptions: PHENAZOPYRIDINE HCL 200 MG TABS (PHENAZOPYRIDINE HCL) One tab by mouth three times a day after meals for 2d  #6 x 0   Entered and Authorized by:   Rodney Langton MD   Signed by:  Rodney Langton MD on 03/11/2010   Method used:   [facility]ronically to        [facility]  [address] [phone]* (retail)       [address]       [place], [place]  [address]       Ph: [phone] or [phone]       Fax: [phone]   RxID:   [id number] KEFLEX 500 MG CAPS (CEPHALEXIN) One tab by mouth two times a day x 7d  #14 x 0   Entered and Authorized by:   Rodney Langton MD   Signed by:   Rodney Langton MD on 03/11/2010   Method used:   [facility]ronically to        [facility]  [address] [phone]* (retail)       [address]       [place], [place]   [address]       Ph: [phone] or [phone]       Fax: [phone]   RxID:   [id number]    Appended Document: ED Visit Consultation Dictation # [id number]

## 2010-03-16 NOTE — Assessment & Plan Note (Signed)
Summary: shoulder pain/bmc   Vital Signs:  Patient profile:   70 year old male Weight:      194.7 pounds Pulse rate:   80 / minute BP sitting:   137 / 79  (right arm)  Vitals Entered By: Arlyss Repress CMA, (January 19, 2010 2:02 PM) CC: bilateral shoulder pain x 2 weeks. refill meds. Is Patient Diabetic? No Pain Assessment Patient in pain? yes     Location: shoulder Intensity: 8 Onset of pain  x 2 weeks   Primary Care Provider:  Alvia Grove DO  CC:  bilateral shoulder pain x 2 weeks. refill meds..  History of Present Illness: 70 yo male with 2 week hx of bilateral shoulder pain.  Hx of arthritis in both shoulders.  Pain is chronic in nature.  Pain located in bilateral shoulders and hurts all over.  No weakness, no stregnth loss. Pain worse whan he trys to lift arms over his head.   Habits & Providers  Alcohol-Tobacco-Diet     Alcohol drinks/day: 1pint weekly of whiskey     Tobacco Status: quit  Current Problems (verified): 1)  Shoulder Pain, Bilateral  (ICD-719.41) 2)  Hyperglycemia  (ICD-790.29) 3)  Hx of Palpitations, Recurrent  (ICD-785.1) 4)  Hx of Syncope  (ICD-780.2) 5)  Polyarthritis  (ICD-719.49) 6)  ? of Rheumatoid Arthritis  (ICD-714.0) 7)  Intention Tremor  (ICD-333.1) 8)  Elevated Prostate Specific Antigen  (ICD-790.93) 9)  Dizziness  (ICD-780.4) 10)  Hyperlipidemia  (ICD-272.4) 11)  COPD  (ICD-496) 12)  Chest Pain, Atypical  (ICD-786.59) 13)  Seborrheic Keratosis  (ICD-702.19) 14)  Tobacco Use, Quit  (ICD-V15.82) 15)  Migraine, Unspec., w/o Intractable Migraine  (ICD-346.90) 16)  Hypertension, Benign Systemic  (ICD-401.1) 17)  Gastroesophageal Reflux, No Esophagitis  (ICD-530.81)  Current Medications (verified): 1)  Albuterol 90 Mcg/act Aers (Albuterol) .... Inhale 1-2 Puff Using Inhaler Every Four Hours 2)  Aspirin Ec 81 Mg Tbec (Aspirin) .... Take 1 Tablet By Mouth Once A Day 3)  Hydrocodone-Acetaminophen 5-325 Mg Tabs  (Hydrocodone-Acetaminophen) .Marland Kitchen.. 1-2 By Mouth Every 6 Hours As Needed For Your Very Worst Arthritis Pain, Do Not Drive After Taking 4)  Lisinopril-Hydrochlorothiazide 20-12.5 Mg Tabs (Lisinopril-Hydrochlorothiazide) .Marland Kitchen.. 1 By Mouth Once Daily For Blood Pressure.  Replaces Old Lisinopril 5)  Atrovent Hfa 17 Mcg/act Aers (Ipratropium Bromide Hfa) .... 2 Puffs Inhaled Qid For Copd 6)  Ibuprofen 600 Mg Tabs (Ibuprofen) .... Take 1 Pill By Mouth Every 8 Hours For 2 Weeks  Allergies (verified): No Known Drug Allergies  Past History:  Past Medical History: Last updated: 07/21/2009 h/o syncopal episode with negative cardiac work up [employer] - WNL EF 59% - 10/13/2004, Cardiolite-WNL EF 65% 4/09 TTE 10/22/07 - poor image quality.  EF 55%, mildly calcified AV Carotid dopplers 10/22/07 - no significant ICA stenosis bilaterally, mid mixed plaque throughout bilaterally, vertebral artery flow antegrade bilaterally h/o HYPERGLYCEMIA (ICD-790.29) h/o PALPITATIONS, RECURRENT (ICD-785.1) POLYARTHRITIS (ICD-719.49)     ? of RHEUMATOID ARTHRITIS (ICD-714.0) - seen by Dr Kellie Simmering of Rhemumatology INTENTION TREMOR (ICD-333.1) ELEVATED PROSTATE SPECIFIC ANTIGEN (ICD-790.93) - [facility] HYPERLIPIDEMIA (ICD-272.4) COPD (ICD-496)       TOBACCO USE, QUIT (ICD-V15.82) MIGRAINE, UNSPEC., W/O INTRACTABLE MIGRAINE (ICD-346.90) HYPERTENSION, BENIGN SYSTEMIC (ICD-401.1) GASTROESOPHAGEAL REFLUX, NO ESOPHAGITIS (ICD-530.81)  Past Surgical History: Last updated: 05/12/2007 Cardiolite - WNL EF 59% - 10/13/2004, L Knee aspiration 04, 2/07 - 12/30/2002,  L knee steroid INJ 2/07 - 04/11/2005,  s/p fusion of vetebra in lower back - 12/30/2002 colonoscopy  10/07- tubulovillous adenoma polyps  Family History: Last updated: 2008/08/18 mother died at 12, father at 30, Mother with DM, No MI, No Stroke.  Doesn't remember father's cause of death  Social History: Last updated: 01/19/2010 Lives with wife;  disabled secondary to back injury; smokes 1 pack q two days - Quit 10/2004; drinks a pint of whiskey weekly.  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   currently exercises only 1-2 daily , walking about 1 mile  Risk Factors: Alcohol Use: 1pint weekly of whiskey (01/19/2010)  Risk Factors: Smoking Status: quit (01/19/2010)  Social History: Lives with wife; disabled secondary to back injury; smokes 1 pack q two days - Quit 10/2004; drinks a pint of whiskey weekly.  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   currently exercises only 1-2 daily , walking about 1 mile  Smoking Status:  quit  Review of Systems       see hpi  Physical Exam  General:  Vs reviewed, hypertensive, well-developed, well-nourished, and well-hydrated.   Lungs:  Normal respiratory effort, chest expands symmetrically. Lungs are clear to auscultation, no crackles or wheezes. Heart:  Normal rate and regular rhythm. S1 and S2 normal without gallop, murmur, click, rub or other extra sounds.   Shoulder/Elbow Exam  Skin:    Intact, no scars, lesions, rashes, cafe au lait spots or bruising.    Palpation:    tenderness R-AC: and tenderness L-AC:.    Impingement Sign NEER:    Right negative; Left negative Impingement Sign HAWKINS:    Right negative; Left negative Sulcus Sign:    Right negative; Left negative Speeds:    Right negative; Left negative   Impression & Recommendations:  Problem # 1:  SHOULDER PAIN, BILATERAL (ICD-719.41) see pt instructions His updated medication list for this problem includes:    Aspirin Ec 81 Mg Tbec (Aspirin) .Marland Kitchen... Take 1 tablet by mouth once a day    Hydrocodone-acetaminophen 5-325 Mg Tabs (Hydrocodone-acetaminophen) .Marland Kitchen... 1-2 by mouth every 6 hours as needed for your very worst arthritis pain, do not drive after taking    Ibuprofen 600 Mg Tabs (Ibuprofen) .Marland Kitchen... Take 1 pill by mouth every 8 hours for 2 weeks  Orders: [facility]- Est  Level 4  ([address])  Problem # 2:  COPD (ICD-496) Assessment: Unchanged  His updated medication list for this problem includes:    Albuterol 90 Mcg/act Aers (Albuterol) ..... Inhale 1-2 puff using inhaler every four hours    Atrovent Hfa 17 Mcg/act Aers (Ipratropium bromide hfa) .Marland Kitchen... 2 puffs inhaled qid for copd  Orders: [facility]- Est  Level 4 ([address])  Problem # 3:  HYPERTENSION, BENIGN SYSTEMIC (ICD-401.1) Assessment: Unchanged continue meds His updated medication list for this problem includes:    Lisinopril-hydrochlorothiazide 20-12.5 Mg Tabs (Lisinopril-hydrochlorothiazide) .Marland Kitchen... 1 by mouth once daily for blood pressure.  replaces old lisinopril  Orders: [facility]- Est  Level 4 ([address])  Problem # 4:  GASTROESOPHAGEAL REFLUX, NO ESOPHAGITIS (ICD-530.81)  The following medications were removed from the medication list:    Omeprazole 40 Mg Cpdr (Omeprazole) .Marland Kitchen... 1 by mouth once twice daily for reflux  Orders: [facility]- Est  Level 4 ([address])  Complete Medication List: 1)  Albuterol 90 Mcg/act Aers (Albuterol) .... Inhale 1-2 puff using inhaler every four hours 2)  Aspirin Ec 81 Mg Tbec (Aspirin) .... Take 1 tablet by mouth once a day 3)  Hydrocodone-acetaminophen 5-325 Mg Tabs (Hydrocodone-acetaminophen) .Marland Kitchen.. 1-2 by mouth every 6 hours as needed for your very worst arthritis pain,  do not drive after taking 4)  Lisinopril-hydrochlorothiazide 20-12.5 Mg Tabs (Lisinopril-hydrochlorothiazide) .Marland Kitchen.. 1 by mouth once daily for blood pressure.  replaces old lisinopril 5)  Atrovent Hfa 17 Mcg/act Aers (Ipratropium bromide hfa) .... 2 puffs inhaled qid for copd 6)  Ibuprofen 600 Mg Tabs (Ibuprofen) .... Take 1 pill by mouth every 8 hours for 2 weeks  Patient Instructions: 1)  Take the Ibuprofen every 8 hours for 2 weeks. 2)  I have refilled your other medicine for you today 3)  If you don't feel better after 2 weeks RTC  Prescriptions: HYDROCODONE-ACETAMINOPHEN 5-325 MG TABS (HYDROCODONE-ACETAMINOPHEN) 1-2 by  mouth every 6 hours as needed for your very worst arthritis pain, do not drive after taking  #16 x 0   Entered and Authorized by:   Alvia Grove DO   Signed by:   Alvia Grove DO on 01/23/2010   Method used:   Handwritten   RxID:   [id number] HYDROCODONE-ACETAMINOPHEN 5-325 MG TABS (HYDROCODONE-ACETAMINOPHEN) 1-2 by mouth every 6 hours as needed for your very worst arthritis pain, do not drive after taking  #78 x 0   Entered and Authorized by:   Alvia Grove DO   Signed by:   Alvia Grove DO on 01/19/2010   Method used:   Handwritten   RxID:   [id number] IBUPROFEN 600 MG TABS (IBUPROFEN) take 1 pill by mouth every 8 hours for 2 weeks  #21 x 0   Entered and Authorized by:   Alvia Grove DO   Signed by:   Alvia Grove DO on 01/19/2010   Method used:   [facility]        [facility]  [address] [phone]* (retail)       [address]       [place], [place]  [id number]       Ph: [phone] or [phone]       Fax: [phone]   RxID:   [id number]  Fill date on vicodin today and 03-22-09  Orders Added: 1)  [facility]- Est  Level 4 [[address]]

## 2010-03-16 NOTE — Consult Note (Signed)
Summary: [facility]  [facility]   Imported By: Knox Royalty 03/09/2010 08:45:39  _____________________________________________________________________  External Attachment:    Type:   Image     Comment:   External Document

## 2010-03-22 NOTE — Consult Note (Signed)
Summary: ED Visit Consultation    History of Present Illness: PCP:  Priscella Mann MD   History of Present Illness:      70 yo male with hx prostate bx 2 weeks ago with several hours of pain in the suprapubic region.  Also associated with dysuria/burning, difficulty urinating.  No fevers/chills, some nausea, no presyncope, dizziness, CP, or SOB.  No rashes.  No flank tenderness.  In the ED, UA suggestive of UTI, UCx obtained and pt given cipro.  CT abd/pelvis showed only 6mm R renal stone, non-obstructing.  Was given zofran which improved his nausea.  He was tachycardic initially and so we were called to eval for admission vs treatment in ED and discharge.    Primary Care Provider:  Priscella Mann MD   History of Present Illness: 70 yo male with hx prostate bx 2 weeks ago with several hours of pain in the suprapubic region.  Also associated with dysuria/burning, difficulty urinating.  No fevers/chills, some nausea, no presyncope, dizziness, CP, or SOB.  No rashes.  No flank tenderness.  In the ED, UA suggestive of UTI, UCx obtained and pt given cipro.  CT abd/pelvis showed only 6mm R renal stone, non-obstructing.  Was given zofran which improved his nausea.  He was tachycardic initially and so we were called to eval for admission vs treatment in ED and discharge.    Past History:  Past Medical History: Last updated: 07/21/2009 h/o syncopal episode with negative cardiac work up [employer] - WNL EF 59% - 10/13/2004, Cardiolite-WNL EF 65% 4/09 TTE 10/22/07 - poor image quality.  EF 55%, mildly calcified AV Carotid dopplers 10/22/07 - no significant ICA stenosis bilaterally, mid mixed plaque throughout bilaterally, vertebral artery flow antegrade bilaterally h/o HYPERGLYCEMIA (ICD-790.29) h/o PALPITATIONS, RECURRENT (ICD-785.1) POLYARTHRITIS (ICD-719.49)     ? of RHEUMATOID ARTHRITIS (ICD-714.0) - seen by Dr Kellie Simmering of Rhemumatology INTENTION TREMOR (ICD-333.1) ELEVATED PROSTATE SPECIFIC  ANTIGEN (ICD-790.93) - [facility] HYPERLIPIDEMIA (ICD-272.4) COPD (ICD-496)       TOBACCO USE, QUIT (ICD-V15.82) MIGRAINE, UNSPEC., W/O INTRACTABLE MIGRAINE (ICD-346.90) HYPERTENSION, BENIGN SYSTEMIC (ICD-401.1) GASTROESOPHAGEAL REFLUX, NO ESOPHAGITIS (ICD-530.81)  Past Surgical History: Last updated: 05/12/2007 Cardiolite - WNL EF 59% - 10/13/2004, L Knee aspiration 04, 2/07 - 12/30/2002,  L knee steroid INJ 2/07 - 04/11/2005,  s/p fusion of vetebra in lower back - 12/30/2002 colonoscopy 10/07- tubulovillous adenoma polyps  Family History: Last updated: 26-Aug-2008 mother died at 19, father at 49, Mother with DM, No MI, No Stroke.  Doesn't remember father's cause of death  Social History: Last updated: 01/19/2010 Lives with wife; disabled secondary to back injury; smokes 1 pack q two days - Quit 10/2004; drinks a pint of whiskey weekly.  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   currently exercises only 1-2 daily , walking about 1 mile    Review of Systems       See HPI   Vital Signs:  Patient profile:   70 year old male O2 Sat:      95 % on Room air Temp:     99.9 degrees F Pulse rate:   101 / minute Resp:     20 per minute BP supine:   126 / 88  O2 Flow:  Room air   Physical Exam  General:  Alert, NAD Head:  [place]/AT Eyes:  PERRL, EOMI Ears:  External exam unremarkable Nose:  External exam unremarkable Mouth:  Mucosae dry, lips parched. Neck:  Supple, no masses. Lungs:  Normal respiratory effort, chest expands symmetrically. Lungs are clear to auscultation, no crackles or wheezes. Heart:  Normal rate and regular rhythm. S1 and S2 normal without gallop, murmur, click, rub or other extra sounds. Abdomen:  Soft, no masses, good BS, TTP R suprapubic, no CVAT. Rectal:  Good tone, prostate not enlarged and non-tender. Extremities:  Warm, well perfused, no edema. Skin:  Warm, dry, no rashes. Additional Exam:  CT abd/pelv w/ contrast:   Nonobstructing right renal stone measuring 0.6 cm is unchanged.  Small hepatic and renal cysts. Lumbar spondylosis and advanced right hip osteoarthritis.  CBC: 14>13.7/41<257 N: 89% CMET: Completely normal UA:  Appearance           CLOUDY   Specific Gravity     1.021     pH                           6.5    Urine Glucose        NEGATIVE      Bilirubin                  NEGATIVE   Ketones                 15     Blood                     MODERATE   Protein                 30       Urobilinogen          0.2         Nitrite                  NEGATIVE       Leukocytes         MODERATE    Micro: Few squams, 21-50 WBCs, 3-6 RBCs    Impression & Recommendations:  Problem # 1:  ABDOMINAL PAIN Symptoms, exam, and labs indicative of cystitis, prostate non tender indicates no prostatitis.  No CVAT so unlikely pyelonephritis. Received 400mg  cipro in ED, 1g Rocephin in ED. Will DC with Keflex 500 mg by mouth two times a day for 7d. Phenazopyridine for urinary analgesia. Follow up UCx. Would need him to void so will DC foley now and do voiding trial.  Problem # 2:  SINUS TACHYCARDIA Improved to 80's-90's s/p 1L NS bolus. BPs stable, with high specific grav on urine, dry mucosae, and improvement in HR with IVF, likely related to intravascular depletion/dehydration. Cont aggressive oral rehydration. Pt should be seen at [facility] Monday to ensure adequate response to treatment.  Problem # 3:  COPD, HTN, GERD All stable, no further changes needed in the ED setting.  Problem # 4:  DISPOSITION To home with oral antibiotics, urinary analgesics, and rehydration instructions.  Complete Medication List: 1)  Albuterol 90 Mcg/act Aers (Albuterol) .... Inhale 1-2 puff using inhaler every four hours 2)  Aspirin Ec 81 Mg Tbec (Aspirin) .... Take 1 tablet by mouth once a day 3)  Hydrocodone-acetaminophen 5-325 Mg Tabs (Hydrocodone-acetaminophen) .Marland Kitchen.. 1-2 by mouth every 6 hours as needed for your very worst  arthritis pain, do not drive after taking 4)  Lisinopril-hydrochlorothiazide 20-12.5 Mg Tabs (Lisinopril-hydrochlorothiazide) .Marland Kitchen.. 1 by mouth once daily for blood pressure.  replaces old lisinopril 5)  Atrovent Hfa 17 Mcg/act Aers (Ipratropium bromide hfa) .... 2 puffs inhaled qid for copd  6)  Ibuprofen 600 Mg Tabs (Ibuprofen) .... Take 1 pill by mouth every 8 hours for 2 weeks 7)  Tramadol Hcl 50 Mg Tabs (Tramadol hcl) .... Take 1-2 pills every six hours as needed for pain   Appended Document: ED Visit Consultation Pt c/o penile discharge, noted translucent discharge, swabbed this for GC/Chlam, will follow this. Pt already given CTX 1 g IM in ED. Rodney Langton MD  March 11, 2010 12:23 PM    Medications Added KEFLEX 500 MG CAPS (CEPHALEXIN) One tab by mouth two times a day x 7d PHENAZOPYRIDINE HCL 200 MG TABS (PHENAZOPYRIDINE HCL) One tab by mouth three times a day after meals for 2d          Clinical Lists Changes  Medications: Added new medication of KEFLEX 500 MG CAPS (CEPHALEXIN) One tab by mouth two times a day x 7d - Signed Added new medication of PHENAZOPYRIDINE HCL 200 MG TABS (PHENAZOPYRIDINE HCL) One tab by mouth three times a day after meals for 2d - Signed Rx of KEFLEX 500 MG CAPS (CEPHALEXIN) One tab by mouth two times a day x 7d;  #14 x 0;  Signed;  Entered by: Rodney Langton MD;  Authorized by: Rodney Langton MD;  Method used: [facility] [address] [phone]*, [address], [place], [place], [place]  [id number], Ph: [phone] or [phone], Fax: [phone] Rx of PHENAZOPYRIDINE HCL 200 MG TABS (PHENAZOPYRIDINE HCL) One tab by mouth three times a day after meals for 2d;  #6 x 0;  Signed;  Entered by: Rodney Langton MD;  Authorized by: Rodney Langton MD;  Method used: Electronically to [facility] [address] [phone]*, [address] [place], [place], [place]  [id number], Ph: [phone] or [phone],  Fax: [phone]    Prescriptions: PHENAZOPYRIDINE HCL 200 MG TABS (PHENAZOPYRIDINE HCL) One tab by mouth three times a day after meals for 2d  #6 x 0   Entered and Authorized by:   Rodney Langton MD   Signed by:   Rodney Langton MD on 03/11/2010   Method used:   Electronically to        [facility] [address] [phone]* (retail)       [address]       [place], [place]  [address]       Ph: [phone] or [phone]       Fax: [phone]   RxID:   [id number] KEFLEX 500 MG CAPS (CEPHALEXIN) One tab by mouth two times a day x 7d  #14 x 0   Entered and Authorized by:   Rodney Langton MD   Signed by:   Rodney Langton MD on 03/11/2010   Method used:   [facility]ronically to        [facility]  [address] [phone]* (retail)       [address]       [place], [place]  [address]       Ph: [phone] or [phone]       Fax: [phone]   RxID:   [id number]    Appended Document: ED Visit Consultation Dictation # [id number]

## 2010-03-22 NOTE — Consult Note (Signed)
[facility]: [facility]   Imported By: De Nurse 03/14/2010 14:46:14  _____________________________________________________________________  External Attachment:    Type:   Image     Comment:   External Document

## 2010-03-22 NOTE — Assessment & Plan Note (Signed)
Summary: f/u UTI/seen in ED/eo   Vital Signs:  Patient profile:   70 year old male Weight:      192.9 pounds Temp:     98.7 degrees F Pulse rate:   86 / minute BP sitting:   166 / 91  (left arm)  Vitals Entered By: Theresia Lo RN (March 15, 2010 1:32 PM) CC: follow up Er visit for UTI Is Patient Diabetic? No   Primary Care Provider:  Priscella Mann MD  CC:  follow up Er visit for UTI.  History of Present Illness: 1. F/U UTI:  Pt was diagnosed with a UTI on Saturday (3 days ago).  He had problems urinating and pain when he urinated.  He had + UA, negative abd CT, and normal prostate exam.  He was given Keflex for 7 days.  He is feeling much better now.  He is having no problems urinating.  He denies any burning, pain, or increased frequency of urination.  Denies abdominal pain, denies flank pain  ROS: denies fevers, chills, n/v  Habits & Providers  Alcohol-Tobacco-Diet     Tobacco Status: quit  Current Medications (verified): 1)  Albuterol 90 Mcg/act Aers (Albuterol) .... Inhale 1-2 Puff Using Inhaler Every Four Hours 2)  Aspirin Ec 81 Mg Tbec (Aspirin) .... Take 1 Tablet By Mouth Once A Day 3)  Hydrocodone-Acetaminophen 5-325 Mg Tabs (Hydrocodone-Acetaminophen) .Marland Kitchen.. 1-2 By Mouth Every 6 Hours As Needed For Your Very Worst Arthritis Pain, Do Not Drive After Taking 4)  Lisinopril-Hydrochlorothiazide 20-12.5 Mg Tabs (Lisinopril-Hydrochlorothiazide) .Marland Kitchen.. 1 By Mouth Once Daily For Blood Pressure.  Replaces Old Lisinopril 5)  Atrovent Hfa 17 Mcg/act Aers (Ipratropium Bromide Hfa) .... 2 Puffs Inhaled Qid For Copd 6)  Ibuprofen 600 Mg Tabs (Ibuprofen) .... Take 1 Pill By Mouth Every 8 Hours For 2 Weeks 7)  Tramadol Hcl 50 Mg Tabs (Tramadol Hcl) .... Take 1-2 Pills Every Six Hours As Needed For Pain 8)  Keflex 500 Mg Caps (Cephalexin) .... One Tab By Mouth Two Times A Day X 7d 9)  Phenazopyridine Hcl 200 Mg Tabs (Phenazopyridine Hcl) .... One Tab By Mouth Three Times A Day After  Meals For 2d  Allergies: No Known Drug Allergies  Social History: Reviewed history from 01/19/2010 and no changes required. Lives with wife; disabled secondary to back injury; smokes 1 pack q two days - Quit 10/2004; drinks a pint of whiskey weekly.  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   currently exercises only 1-2 daily , walking about 1 mile  Physical Exam  General:  Vitals reviewed,  Afebrile.  Alert, NAD Lungs:  Normal respiratory effort, chest expands symmetrically. Lungs are clear to auscultation, no crackles or wheezes. Heart:  Normal rate and regular rhythm.  Abdomen:  soft, non-tender, normal bowel sounds, no distention, no masses, no guarding, no rigidity, and no rebound tenderness.   Skin:  no rashes.   Additional Exam:  GC/Chlamydia: negative UCx: no growth   Impression & Recommendations:  Problem # 1:  ACUTE CYSTITIS (ICD-595.0) Assessment Improved  Much improved from diagnosis in the ED.  Denies any current symptoms.  Advised to complete course of antibiotics.  Advised to RTC if symptoms return.  No further work up at this time. His updated medication list for this problem includes:    Keflex 500 Mg Caps (Cephalexin) ..... One tab by mouth two times a day x 7d    Phenazopyridine Hcl 200 Mg Tabs (Phenazopyridine hcl) ..... One tab by  mouth three times a day after meals for 2d  Orders: [facility]- Est Level  3 (16109)  Complete Medication List: 1)  Albuterol 90 Mcg/act Aers (Albuterol) .... Inhale 1-2 puff using inhaler every four hours 2)  Aspirin Ec 81 Mg Tbec (Aspirin) .... Take 1 tablet by mouth once a day 3)  Hydrocodone-acetaminophen 5-325 Mg Tabs (Hydrocodone-acetaminophen) .Marland Kitchen.. 1-2 by mouth every 6 hours as needed for your very worst arthritis pain, do not drive after taking 4)  Lisinopril-hydrochlorothiazide 20-12.5 Mg Tabs (Lisinopril-hydrochlorothiazide) .Marland Kitchen.. 1 by mouth once daily for blood pressure.  replaces old lisinopril 5)  Atrovent Hfa  17 Mcg/act Aers (Ipratropium bromide hfa) .... 2 puffs inhaled qid for copd 6)  Ibuprofen 600 Mg Tabs (Ibuprofen) .... Take 1 pill by mouth every 8 hours for 2 weeks 7)  Tramadol Hcl 50 Mg Tabs (Tramadol hcl) .... Take 1-2 pills every six hours as needed for pain 8)  Keflex 500 Mg Caps (Cephalexin) .... One tab by mouth two times a day x 7d 9)  Phenazopyridine Hcl 200 Mg Tabs (Phenazopyridine hcl) .... One tab by mouth three times a day after meals for 2d  Patient Instructions: 1)  I'm glad that you are feeling better 2)  There is no need for further work up at this time 3)  Please schedule a follow up appointment as needed   Orders Added: 1)  [facility]- Est Level  3 [60454]

## 2010-04-04 ENCOUNTER — Other Ambulatory Visit: Payer: Self-pay | Admitting: Family Medicine

## 2010-04-04 MED ORDER — OMEPRAZOLE 40 MG PO CPDR: 40.0000 mg | DELAYED_RELEASE_CAPSULE | Freq: Two times a day (BID) | ORAL | Status: DC

## 2010-04-05 ENCOUNTER — Inpatient Hospital Stay (INDEPENDENT_AMBULATORY_CARE_PROVIDER_SITE_OTHER)
Admission: RE | Admit: 2010-04-05 | Discharge: 2010-04-05 | Disposition: A | Discharge status: Home / Self Care | Payer: MEDICARE | Source: Ambulatory Visit | Attending: Family Medicine | Admitting: Family Medicine

## 2010-04-05 ENCOUNTER — Telehealth: Payer: Self-pay | Admitting: *Deleted

## 2010-04-05 DIAGNOSIS — H109 Unspecified conjunctivitis: Secondary | ICD-10-CM

## 2010-04-05 NOTE — Telephone Encounter (Signed)
recieved refill request for omeprazole yesterday . Dr. Jennette Kettle refilled for one month and patient was notified to schedule appointment  before next refill is needed.

## 2010-04-09 ENCOUNTER — Encounter: Payer: Self-pay | Admitting: Family Medicine

## 2010-04-09 DIAGNOSIS — G5690 Unspecified mononeuropathy of unspecified upper limb: Secondary | ICD-10-CM | POA: Insufficient documentation

## 2010-04-19 ENCOUNTER — Encounter: Payer: Self-pay | Admitting: Home Health Services

## 2010-04-19 ENCOUNTER — Ambulatory Visit (INDEPENDENT_AMBULATORY_CARE_PROVIDER_SITE_OTHER): Payer: Medicare Other | Admitting: Home Health Services

## 2010-04-19 VITALS — BP 117/82 | HR 102 | Temp 99.0°F | Ht 70.0 in | Wt 197.0 lb

## 2010-04-19 DIAGNOSIS — Z0000 Encounter for general adult medical examination without abnormal findings: Secondary | ICD-10-CM

## 2010-04-19 NOTE — Progress Notes (Signed)
Patient here for annual wellness visit, patient reports: Risk Factors/Conditions needing evaluation or treatment: Patient does not have any risk factors needing evaluation. Diet: Patient's diet lacks fruits, vegetables, and whole grains.  Patient does not like most whole grains.  Patient enjoys fried foods, Gatorade, slaw, juice, eggs, gravy etc.   Set goal to incorporate fruits and veggies into his diet. Physical Activity: Patient does not currently work out.  He is a [facility] of [employer] and set the goal of working out 2 times a week. Home Safety: Patient lives in 1 story home with his granddaughter and great-granddaughter.  Patient reports having smoke detectors but does not have any adaptive equipment.  End of Life Plan: Patient reports having his will taken care of but does not have any advance directives.   Patient identified his neighbor, Orion Crook as his emergency contact at [phone]. Other Information: Patient has full dentures. Patient wears corrective lens. Patient currently has prostate cancer and is under the care of Dr. Gilford Raid. Patient blacked out about a year ago and fell while visiting a friend in the hospital.    Balance max value patientvalue  Sitting balance 1 1  Arise 2 1  Attempts to arise 2 2  Immediate standing balance 2 2  Standing balance 1 1  Nudge 2 2  Eyes closed 1 1  360 degree turn 1 1  Sitting down 2 2   Gait max value patient value  Initiation of gait 1 1  Step length-left 1 1  Step length-right 1 1  Step height-left 1 1  Step height-right 1 1  Step symmetry 1 1  Step continuity 1 1  Path 2 2  Trunk 2 2  Walking stance 1 1   Balance/Gait Score: 25/26  Mental Status Exam max value patient value  Orientation to time 5 5  Orientation to place 5 5  Registration 3 3  Attention 5 5  Recall 3 3  Language (name 2 objects) 2 2  Language-repeat 1 1  Language-follow 3 step command 3 3  Language-read and follow directions 1 1   Mental  Status Exam: 28/28   Annual Wellness Visit Requirements Recorded Today In  Medical, family, social history Past Medical, Family, Social History Section  Current providers Care team  Current medications Medications  Wt, BP, Ht, BMI Vital signs  Tobacco, alcohol, illicit drug use History  ADL Nurse Assessment  Depression Screening Nurse Assessment  Cognitive impairment Nurse Assessment  Fall Risk Nurse Assessment  Home Safety Progress Note  End of Life Planning (welcome visit) Progress Note  Medicare preventative services Progress Note  Risk factors/conditions needing evaluation/treatment Progress Note  Personalized health advice Patient Instructions, goals, letter    Medicare Prevention Plan: Patient declined flu vaccine. Patient will follow up with his pharmacy about the shingles vaccine.   Recommended Medicare Prevention Screenings Men over 43 Test For Frequency Date of Last- BOLD if needed  Colorectal Cancer 1-10 yrs 04/2007  Prostate Cancer Never or yearly 06/2008  Aortic Aneurysm Once if 65-75 with hx of smoking recommended  Cholesterol 5 yrs 05/2009  Diabetes yearly Non diabetic  HIV yearly declined  Influenza Shot yearly declined  Pneumonia Shot once 10/2005  Zostavax Shot once [facility] patient pamphlet

## 2010-04-19 NOTE — Patient Instructions (Signed)
1. Work out at [employer] 2 times a week for 30 minutes. 2. Eat 2 vegetables and 2 fruits every day. 3. Work on losing 15 pounds by March 2013. 4. Follow up with your pharmacy about shingles vaccine. 5. Try to eat less fried foods and drink less Gatorade.   6. Drink lots of water!

## 2010-04-21 ENCOUNTER — Encounter: Payer: Self-pay | Admitting: Home Health Services

## 2010-04-21 NOTE — Progress Notes (Signed)
  Subjective:    Patient ID: Joel Donaldson, male    DOB: 01/20/1941, 70 y.o.   MRN: [id number]  HPI    Review of Systems     Objective:   Physical Exam        Assessment & Plan:  I have reviewed this visit and discussed with Arlys John and agree with her documentation.

## 2010-05-01 ENCOUNTER — Encounter: Payer: Self-pay | Admitting: Family Medicine

## 2010-05-01 ENCOUNTER — Ambulatory Visit (INDEPENDENT_AMBULATORY_CARE_PROVIDER_SITE_OTHER): Payer: Medicare Other | Admitting: Family Medicine

## 2010-05-01 DIAGNOSIS — G5690 Unspecified mononeuropathy of unspecified upper limb: Secondary | ICD-10-CM

## 2010-05-01 DIAGNOSIS — R29898 Other symptoms and signs involving the musculoskeletal system: Secondary | ICD-10-CM | POA: Insufficient documentation

## 2010-05-01 DIAGNOSIS — Z7289 Other problems related to lifestyle: Secondary | ICD-10-CM | POA: Insufficient documentation

## 2010-05-01 DIAGNOSIS — C61 Malignant neoplasm of prostate: Secondary | ICD-10-CM

## 2010-05-01 DIAGNOSIS — K219 Gastro-esophageal reflux disease without esophagitis: Secondary | ICD-10-CM

## 2010-05-01 MED ORDER — IBUPROFEN 600 MG PO TABS: 600.0000 mg | ORAL_TABLET | Freq: Three times a day (TID) | ORAL | Status: DC | PRN

## 2010-05-01 MED ORDER — OMEPRAZOLE 40 MG PO CPDR: 40.0000 mg | DELAYED_RELEASE_CAPSULE | Freq: Two times a day (BID) | ORAL | Status: DC

## 2010-05-01 MED ORDER — TRAMADOL HCL 50 MG PO TABS: 50.0000 mg | ORAL_TABLET | Freq: Three times a day (TID) | ORAL | Status: DC | PRN

## 2010-05-01 MED ORDER — ACETAMINOPHEN ER 650 MG PO TBCR: 650.0000 mg | EXTENDED_RELEASE_TABLET | Freq: Three times a day (TID) | ORAL | Status: AC | PRN

## 2010-05-01 MED ORDER — LISINOPRIL-HYDROCHLOROTHIAZIDE 20-12.5 MG PO TABS: 1.0000 | ORAL_TABLET | Freq: Every day | ORAL | Status: DC

## 2010-05-01 MED ORDER — GABAPENTIN 100 MG PO CAPS: 100.0000 mg | ORAL_CAPSULE | Freq: Three times a day (TID) | ORAL | Status: DC

## 2010-05-01 NOTE — Assessment & Plan Note (Signed)
Med refill

## 2010-05-01 NOTE — Progress Notes (Deleted)
  Subjective:    Patient ID: Joel Donaldson, male    DOB: [birth year], 70 y.o.   MRN: [id number]  HPI    Review of Systems     Objective:   Physical Exam        Assessment & Plan:

## 2010-05-01 NOTE — Progress Notes (Signed)
  Subjective:    Patient ID: Joel Donaldson, male    DOB: Dec 22, 1940, 70 y.o.   MRN: [id number]  HPI 1. R arm pain. Started 01/2011 after blood draw. Saw neurologist. Diagnosed distal ulnar neuropathy. Received ?"acupuncture". Did not help. Patient actually felt "terrible" during and after.   Complaining of pain in right antecubital region.  Constant pain but sometimes worsens. May be worse with activity. Wakes him up from night almost every day. Vicodin helps. Tramadol made him nauseous. Also taking ibuprofen two daily.   Fingers tingle occasionally.  Weakness right arm and fingers. Difficultly cooking and doing other tasks around the house.   Review of Systems Denies chest pain, dyspnea.     Objective:   Physical Exam  Constitutional: He appears well-developed and well-nourished. No distress.  Cardiovascular: Normal rate, regular rhythm and normal heart sounds.   Pulmonary/Chest: Effort normal and breath sounds normal.  Musculoskeletal:       R arm:  Tenderness: none to palpation over antecubital fossa region or anywhere else along shoulder or arm; some pain with internal and external rotation of elbow and shoulder ROM: intact; similar to left arm and shoulder Strength: 3-4+ elbow and fingers; 4+ on left Sensation: intact throughout Pulses: radial pulses intact Reflexes: difficult to elicit bilaterally           Assessment & Plan:

## 2010-05-01 NOTE — Patient Instructions (Addendum)
For your arm pain:  -Please give [place] the number to the neurologist's office.  -Please schedule a follow-up appointment with the neurologist.   -Please take ibuprofen every 8 hours as needed for the pain. You may also take Tylenol 650mg  every 8 hours as well.   -I will also prescribe a medication called Neurontin. Try this for the next 2-4 weeks to see if it helps. You take this 3 times a day.   -We will refer you to physical therapy to help evaluate that arm and to give exercises to make that arm stronger.   Please follow-up with me in 1 month.    dd

## 2010-05-01 NOTE — Assessment & Plan Note (Signed)
LFTs normal. Will try to assess more next visit and go through CAGE questions.

## 2010-05-01 NOTE — Progress Notes (Deleted)
Subjective:    Patient ID: Joel Donaldson is a 70 y.o. male.  Chief Complaint: HPI {ZOX:[id number]} {Also Blocks:[id number]::" "}   Social History   Occupational History  . Retired    Social History Main Topics  . Smoking status: Former Smoker    Quit date: 02/13/2004  . Smokeless tobacco: Never Used  . Alcohol Use: 5.0 oz/week    10 drink(s) per week     Patient reports drinking 1-2 glasses a wine a day.  . Drug Use: No  . Sexually Active: Not on file    ROS       Objective:   Ortho Exam       Assessment:     1. Right arm weakness   2. Neuropathy, arm   3. GASTROESOPHAGEAL REFLUX, NO ESOPHAGITIS        Plan:     ***

## 2010-05-01 NOTE — Assessment & Plan Note (Addendum)
Unchanged from initial presentation. First time he is seeing me for this problem. R antecubital discomfort, forearm and hand weakness, and tingling R hand.   Controlled with Norco but do not think this is appropriate medication for this pain, especially with his previous history of tobacco and current history of alcohol use. Will not refill. Tylenol and ibuprofen prn. Kidney and liver function normal based on recent labs. Will start trial of Neurontin to see if this helps. Asked him to take 100 TID for now. Will call him next week. If he tolerates, then will titrate up.  Asked patient to f/u with Neurology. Will f/u 1 month. Refer to PT due to arm weakness.

## 2010-05-03 ENCOUNTER — Telehealth: Payer: Self-pay | Admitting: Family Medicine

## 2010-05-03 NOTE — Telephone Encounter (Signed)
Neurontin TID makes him feel dizzy and like he is "drunk". Asked him to take all 3 tablets (100mg  x3) at bedtime. If this still makes him feel drowsy, cut back to 2 tablets. Will call him back in about a week to re-assess.  Medication seems to be helping pain though. Not sure how much but he notes improvement.

## 2010-05-16 NOTE — Miscellaneous (Signed)
  Clinical Lists Changes Medical Record Request Received Medical Record Request Records went to [employer] [facility] Faxed on 12/24/2008 [facility]  January 13, 2010 4:27 PM

## 2010-05-19 ENCOUNTER — Telehealth: Payer: Self-pay | Admitting: Family Medicine

## 2010-05-19 NOTE — Telephone Encounter (Signed)
Patient taking 2 tablets Neurontin qhs (3 makes him dizzy/drowsy). Arm pain is slightly improved. Medicine may be helping "some".   Has appointment with me next month as well as with neurologist. Asked him to re-schedule appointment with me to after neurologist appointment.

## 2010-05-23 ENCOUNTER — Inpatient Hospital Stay (INDEPENDENT_AMBULATORY_CARE_PROVIDER_SITE_OTHER)
Admission: RE | Admit: 2010-05-23 | Discharge: 2010-05-23 | Disposition: A | Discharge status: Home / Self Care | Payer: Medicare Other | Source: Ambulatory Visit | Attending: Emergency Medicine | Admitting: Emergency Medicine

## 2010-05-23 DIAGNOSIS — M940 Chondrocostal junction syndrome [Tietze]: Secondary | ICD-10-CM

## 2010-05-23 LAB — POCT URINALYSIS DIP (DEVICE)
Bilirubin Urine: NEGATIVE
Hgb urine dipstick: NEGATIVE
Nitrite: NEGATIVE
Protein, ur: NEGATIVE mg/dL
pH: 5.5 (ref 5.0–8.0)

## 2010-05-30 ENCOUNTER — Ambulatory Visit: Payer: Medicare Other | Admitting: Family Medicine

## 2010-06-08 ENCOUNTER — Encounter: Payer: Self-pay | Admitting: Family Medicine

## 2010-06-27 NOTE — Assessment & Plan Note (Signed)
HEALTHCARE                            CARDIOLOGY OFFICE NOTE   NAME:Joel Donaldson, Joel Donaldson                     MRN:          [id number]  DATE:06/02/2007                            DOB:          December 19, 1940    The patient is a very pleasant 70 year old gentleman, who I am asked to  evaluate for chest pain.  He has no prior cardiac history.  He has had  some pain in the past after eating.  He was admitted to [facility] on May 12, 2007, secondary to an episode of chest pain.  The  pain was in the left chest area.  It was initially sharp and then dull  in description.  The pain did not radiate.  It was not pleuritic or  positional, nor was it related to food.  It was not exertional.  There  was no associated nausea, vomiting, shortness of breath or diaphoresis.  It lasted for several hours.  When he was in the hospital, he had a CT  angiogram of his chest secondary to an elevated D-dimer at 0.91.  The  chest CT showed no pulmonary embolus.  He did rule out for myocardial  infarction with serial enzymes.  He has not had chest pain since that  time.  Because of his chest pain, we were asked to further evaluate.  Note, he does not have exertional chest pain, dyspnea on exertion,  orthopnea, PND, but he can occasionally have mild pedal edema.   PRESENT MEDICATIONS:  1. Protonix 40 mg daily.  2. Lisinopril/HCT 10/12.5 daily.   ALLERGIES:  NO KNOWN DRUG ALLERGIES.   SOCIAL HISTORY:  He has a remote history of tobacco use, but has not  smoked in the past 4 years.  He consumes approximately one pint of  alcohol per week by his report.   FAMILY HISTORY:  Negative for coronary artery disease.  He did state  that his father had a pacemaker, but lived until 35 years of age.   PAST MEDICAL HISTORY:  Significant for hypertension, but there is no  diabetes mellitus or hyperlipidemia.  He does have a history of  gastroesophageal reflux disease.  He has a  history of rheumatoid  arthritis.  He has had prior back surgery, as well as tonsillectomy.  He  also apparently has a history of COPD.   REVIEW OF SYSTEMS:  He denies any headaches, fevers or chills.  There is  no productive cough or hemoptysis.  There is no dysphagia, odynophagia,  melena or hematochezia.  There is no dysuria or hematuria.  There are no  rashes or seizure activity.  There is no orthopnea or PND, but there can  occasionally be mild pedal edema.  He does have some arthralgias.  The  remaining systems are negative.   PHYSICAL EXAMINATION:  VITAL SIGNS:  Today, shows a blood pressure of  145/90 and his pulse is 90.  He weighs 186 pounds.  GENERAL:  He is well-developed and well-nourished in no acute distress.  SKIN:  Warm and dry.  There is no peripheral clubbing.  He  does have  ulnar deviation.  BACK:  Normal.  HEENT:  Normal with normal eyelids.  NECK:  Supple with a normal upstroke bilaterally.  No bruits noted.  There is no jugulovenous distention and no thyromegaly is noted.  CHEST:  Clear to auscultation with normal expansion.  CARDIOVASCULAR:  Reveals a regular rate and rhythm with a normal S1-S2.  There are no murmurs, rubs or gallops noted.  ABDOMEN:  Nontender, nondistended.  Positive bowel sounds.  No  hepatosplenomegaly.  No mass appreciated.  There is no bruit.  He has 2+  femoral pulses bilaterally.  No bruits.  EXTREMITIES:  Show no edema.  I could palpate no cords.  There is no  edema.  His distal pulses are mildly diminished.  NEUROLOGIC:  Grossly intact.   I have reviewed his electrocardiogram from May 13, 2007.  There was a  normal sinus rhythm with no ST changes.  The previous electrocardiogram  on May 12, 2007, showed sinus tachycardia at a rate of 101.  There  were nonspecific ST changes.   DIAGNOSES:  1. Atypical chest pain - we will plan to proceed with a stress      Myoview.  If it shows no ischemia, then we will not pursue further       cardiac workup.  Note, his CT showed no pulmonary embolus as well.      This may have been reflux in etiology.  2. Hypertension - his blood pressure is mildly elevated today.      However, he states it typically runs in the 120/84 range.  He will      continue to track this, and I have asked him to follow up with his      primary care physician concerning this issue.  3. Rheumatoid arthritis - per his primary care physician.  4. Gastroesophageal reflux disease - he will continue on his Protonix.   I will see him back on as-needed basis pending the results of his  Myoview.     Madolyn Frieze Jens Som, MD, [facility]  Electronically Signed    BSC/MedQ  DD: 06/02/2007  DT: 06/02/2007  Job #: [id number]   cc:   Benn Moulder, M.D.

## 2010-06-27 NOTE — Discharge Summary (Signed)
NAME:  Joel Donaldson, Joel Donaldson              ACCOUNT NO.:  [id number]   MEDICAL RECORD NO.:  [id number]          PATIENT TYPE:  INP   LOCATION:  3703                         FACILITY:  [facility]   PHYSICIAN:  Zenaida Deed. Mayford Knife, M.D.DATE OF BIRTH:  10-06-1940   DATE OF ADMISSION:  05/12/2007  DATE OF DISCHARGE:  05/13/2007                               DISCHARGE SUMMARY   DISCHARGE DIAGNOSES:  1. Rule out chest pain, noncardiac, likely gastrointestinal related.  2. Hypertension.  3. Gastroesophageal reflux disease.  4. Chronic obstructive pulmonary disease.   DISCHARGE MEDICATIONS:  1. Aspirin 81 mg p.o. daily.  2. Protonix 40 mg p.o. daily.  3. Spiriva once daily.  4. Lisinopril/hydrochlorothiazide (10/12.5) once daily.  5. Aleve as needed for arthritis pain.   CONSULTS:  None.   PROCEDURES:  1. An EKG on March 30 showed normal sinus rhythm.  2. A chest x-ray on March 30 showed no acute process.  3. A CT angiogram of the chest was negative for pulmonary embolism.   LABORATORY DATA:  On  March 30, D-dimer 0.91, cardiac markers at point  of care, troponin less than 0.05, and CK-MB less than 1.0.  Cardiac  enzymes on March, troponin I  0.01, CK 125, and CK-MB 1.8 with a  relative index 1.4.  Repeat cardiac enzymes on May 12, 2007, troponin  I 0.01, CK 127, CK-MB 1.6 with a relative index of 1.3.  Repeat cardiac  enzymes on March 31, troponin I 0.01, CK 125, and CK-MB 1.4 with a  relative index of 1.1.  On the day of discharge, the patient's basic  metabolic panel is significant for a glucose of 100.  It is otherwise  within normal limits with a creatinine of 0.93.  CBC on the day of  discharge, white count 3.8, hemoglobin 12.5, hematocrit 36.8, and  platelets 223.   BRIEF HOSPITAL COURSE:  This is a 70 year old male admitted with  atypical chest pain.  1. Chest pain.  Acute life threatening causes were ruled out as      follows.  Myocardial infarction ruled out by normal EKG and  negative cardiac enzymes at point of care and x3 afterwards.      Pulmonary embolism ruled out by negative CT angiogram of the chest.      Aortic dissection and pneumothorax ruled out by negative chest x-      ray.  2. Hypertension.  This was stable throughout admission.  The patient's      home blood pressure medication were held and he was put on a beta-      blocker during this admission, given that there was concern for      acute coronary syndrome.  He is changed back to his home      medications and discharged on these.  3. Gastroesophageal reflux disease.  The patient was kept on his home      dose of Protonix. This is stable.   DISCHARGE INSTRUCTIONS:  The patient is discharged to home with no  restrictions on activity and is asked to maintain low-sodium heart  healthy diet.  He  has followup appointment with Dr. Jens Som, [facility] on  April 20 at 10:30 a.m.  A follow up appointment with his primary care  physician Dr. Benn Moulder at the [facility] will  also be arranged for the patient.  The patient is discharged to home in  stable medical condition.      Romero Belling, MD  Electronically Signed      Zenaida Deed. Mayford Knife, M.D.  Electronically Signed    MO/MEDQ  D:  05/13/2007  T:  05/14/2007  Job:  [id number]   cc:   Benn Moulder, M.D.  Madolyn Frieze Jens Som, MD, [facility]

## 2010-06-30 NOTE — Discharge Summary (Signed)
NAME:  [name], [name]              ACCOUNT NO.:  [id number]   MEDICAL RECORD NO.:  [id number]          PATIENT TYPE:  INP   LOCATION:  4729                         FACILITY:  [facility]   PHYSICIAN:  Sherin Quarry, MD      DATE OF BIRTH:  [birth year]   DATE OF ADMISSION:  10/22/2004  DATE OF DISCHARGE:  10/24/2004                                 DISCHARGE SUMMARY   Joel Donaldson is a 70 year old man who presented on October 22, 2004 with  acute onset of left parasternal chest aching described as sharp, constant,  nonradiating without associated dyspnea or diaphoresis. There was associated  nausea. The patient was given a total of three sublingual nitroglycerin and  reports the chest pain seemed to gradually get better with this treatment.  Initial point of care cardiac enzymes were negative. EKG showed normal sinus  rhythm without ischemic change. The patient was admitted for observation.   On physical exam, his blood pressure is 149/95, pulse was 97, O2 saturation  99%. HEENT exam was within normal limits. The chest was clear.  Cardiovascular revealed normal S1-S2. There were no rubs, murmurs or  gallops. The abdomen was benign. Normal bowel sounds without masses or  tenderness. No guarding or rebound. Neurologic testing and examination of  the extremities were normal.   Relevant studies obtained included CBC which showed a hemoglobin 12.4.  Normal liver functions normal. Normal serial cardiac enzymes. Lipid profile  showed a cholesterol of 148 with HDL of 42, LDL 78.   The patient was monitored on telemetry and there were no arrhythmias or  other abnormalities identified. Consultation was obtained with Dr. Verdis Prime of the cardiology service. Dr. Katrinka Blazing recommended a stress Cardiolite  to be done on October 24, 2004. The patient was transferred to [facility] for the purpose of having the stress Cardiolite. On October 24, 2004, this was done. The Cardiolite was  interpreted as being negative.  Ejection fraction was 59% and ultrasound was also done of the patient's  gallbladder and it was negative showing no signs of cholecystitis. The  patient also had an echocardiogram that showed a left ventricular ejection  fraction of 55-65%, very mild pectus of the aortic valve was identified not  thought to be of any hemodynamic significance. Therefore, on October 24, 2004, the patient was discharged.   DISCHARGE DIAGNOSIS:  1.  Noncardiac chest pain.  2.  History of previous laminectomy.  3.  A 50 pack-year smoking history.  4.  History of alcohol abuse.   At the time of discharge, Dr. Lendell Caprice advised the patient to stop smoking  and to take Prevacid 30 milligrams daily. She recommended follow-up with Dr.  Yetta Barre in the [facility] in two weeks. Condition at time of discharge  was good.           ______________________________  Sherin Quarry, MD    SY/MEDQ  D:  12/04/2004  T:  12/05/2004  Job:  [id number]

## 2010-06-30 NOTE — Discharge Summary (Signed)
NAME:  Joel Donaldson, Joel Donaldson                        ACCOUNT NO.:  [id number]   MEDICAL RECORD NO.:  [id number]                   PATIENT TYPE:  INP   LOCATION:  2018                                 FACILITY:  [facility]   PHYSICIAN:  Leighton Roach McDiarmid, M.D.             DATE OF BIRTH:  09-23-1940   DATE OF ADMISSION:  12/02/2002  DATE OF DISCHARGE:  12/03/2002                                 DISCHARGE SUMMARY   HISTORY OF PRESENT ILLNESS:  Joel Donaldson is a 70 year old African-  American male with no significant past medical history that presented to  [facility] emergency department status post syncopal episode while pumping  gasoline.  The patient stated that he felt fine the morning of December 02, 2002 with the exception of some nasal congestion.  While he was pumping gas  he began to feel dizzy and hot with subsequent dimming of his vision. The  patient says he then passed out and awoke lying on the cement.  A neighbor  and her daughter were present but unable to be reached at the present time.  Associated symptoms were chest pain left pectoral region, sharp in nature.  The patient denied shortness of breath but felt palpitations before and  after the event.  He did report feeling numb in his left fingertips and a  sensation of nervousness.  There was no reported seizure activity and he  denies bowel and bladder incontinence.  He was brought to the ED for further  evaluation.   PAST MEDICAL HISTORY:  None.   PAST SURGICAL HISTORY:  Status post fusion of vertebra in lower back  secondary to injury.   MEDICATIONS:  None.   ALLERGIES:  No known drug allergies.   SOCIAL HISTORY:  The patient is married, has been married for the past 25  years.  States he has one granddaughter in [place].  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  The patient  smokes one to  one-and-a-half packs per day for the last 20 years.  The  patient drinks three to four beers on the weekend.  The patient denies  illicit drug use.   FAMILY HISTORY:  Mother is deceased, died at 26 years of age of diabetes  mellitus.  Father died at 23 years old due to old age per the patient.  The  patient is an only child with no other significant familial history.   REVIEW OF SYSTEMS:  Pertinent positives:  Nasal congestion.  The patient  reports dark tarry stools for the past few days.  Left knee swelling for the  past three to four weeks.   PHYSICAL EXAMINATION:  VITAL SIGNS:  Temperature 98.0, respiration rate 18,  heart rate 111, blood pressure 123/65, O2 saturation 100% on 2 liters nasal  cannula.  GENERAL:  The patient was pleasant, no apparent distress.  HEENT:  Revealed positive right esotropia.  CARDIOVASCULAR:  Regular rate and rhythm, no murmurs.  LUNGS:  Nonlabored.  Clear to auscultation bilaterally.  EXTREMITIES:  Revealed positive hair loss the lower half of bilateral lower  extremities.  MUSCULOSKELETAL:  Positive for arthritic changes, left knee, with mild  effusion.  No warmth or erythema.  RECTAL:  Hemoccult negative.   ADMIT LABORATORY DATA:  CBC:  White blood cells 5.3, hemoglobin 12.7,  hematocrit 38.5, platelets 288, MCV 86.6.  Sodium 136, potassium 3.8,  chloride 103, bicarb 28, BUN 15, creatinine 1.0, glucose 113, total bili  0.5, alkaline phosphatase 63, SGOT 21, SGPT 15, total protein 6.5, albumin  3.5, calcium 8.8.  Imaging studies during admission and other studies:  EKG  showed normal sinus rhythm, questionable mild left atrial enlargement.  Portable chest x-ray revealed heart and vasculature within normal limits.  Lungs were clear.  No acute processes found.  Cardiac enzymes, first set:  CK 175, CK-MB 1.6, troponin I 0.01.  Cardiac enzymes, set #2:  CK-MB less  than 1.0, troponin I 0.05, myoglobin 132.  Cardiac enzymes set #3:  CK 158,  CK-MB 1.4,  troponin I less than 0.01.   HOSPITAL COURSE BY PROBLEMS LIST:  #1 - SYNCOPE.  The differential was  extensive.  The team doubted an MI with atypical features and blood work,  EKG revealed no acute process.  Cardiac enzymes were also negative x3 sets.  Exam was also normal which was reassuring.  We also did not believe that  this was attributed to seizure.  There was no reported incontinence, seizure  activity, tongue bite, or abnormal muscular movements.  TSH was within  normal limits at 2.491.  We believe that this event was more likely  attributed to a vasovagal event.  The patient was admitted for 24-48 hours  observation with telemetry to evaluate for malignant dysrhythmia and to rule  out MI.  No acute changes were found with telemetry throughout hospital  stay.  Orthostatics were also checked and all were within normal limits.   #2 - LEFT KNEE PAIN.  On physical exam the left knee appeared to have a mild  effusion; no warmth or erythema.  The patient attributes his pain to  arthritic changes.  He denied pain medication during hospital stay, saying  that he does not like to take medications if he does not really need them.  The patient will be followed up on an outpatient basis as needed.   #3 - TOBACCO ABUSE.  The patient has greater than 20 years tobacco abuse.  Nicotine patch was placed per the patient's request.  The patient was  counseled to quit and encouraged to make steps towards cessation during  hospital stay.   DISCHARGE DIAGNOSES:  1. Syncope episode.  2. Left knee pain.  3. Tobacco abuse.   DISCHARGE MEDICATIONS:  1. Aspirin 81 mg one tablet by mouth once daily.  2. Tylenol 325 mg one tablet by mouth q.4-6h. for knee pain.   DISCHARGE ACTIVITY:  As tolerated.   DISCHARGE DIET:  No restrictions.   DISCHARGE DISPOSITION:  The patient much improved without recurrence of dizziness or syncope episode.  Was discharged home and given special  instructions to return  to [facility] emergency department if symptoms of  syncope recur and to follow up with the [name].    DISCHARGE FOLLOW-UP:  The patient will follow up  at [facility] Wednesday, December 30, 2002 at 2 p.m. with Dr. Penni Bombard.      Nilda Simmer, M.D.                        Etta Grandchild, M.D.    KS/MEDQ  D:  12/04/2002  T:  12/04/2002  Job:  [id number]   cc:   Penni Bombard, MD  [facility]- Resident  [address] [place]  [place] [address]  Fax: [phone]

## 2010-06-30 NOTE — Discharge Summary (Signed)
NAME:  Joel Donaldson, Joel Donaldson              ACCOUNT NO.:  [id number]   MEDICAL RECORD NO.:  [id number]          PATIENT TYPE:  INP   LOCATION:  3731                         FACILITY:  [facility]   PHYSICIAN:  Wayne A. Sheffield Slider, M.D.    DATE OF BIRTH:  1940-06-22   DATE OF ADMISSION:  06/19/2005  DATE OF DISCHARGE:  06/20/2005                                 DISCHARGE SUMMARY   DISCHARGE DIAGNOSES:  1.  Noncardiac chest pain.  2.  Hypertension.  3.  Gastroesophageal reflux.   DISCHARGE MEDICATIONS:  1.  Hydrochlorothiazide 25 mg p.o. daily.  2.  Metoprolol 50 mg p.o. b.i.d.  3.  Aspirin 325 mg daily.  4.  Prilosec OTC daily.   DISPOSITION:  The patient discharged to home.   CONSULTATIONS:  None.   PROCEDURE:  None.   BRIEF ADMISSION HISTORY:  This is a 70 year old male who was admitted with  chest pain, nausea, and left arm numbness.  He had a negative cardiac workup  in September 2006.  He also complained of shortness of breath and mild  anxiety.  On admit, temperature was 98.4, blood pressure 177/112, pulse was  77, O2 saturation 94% on room air.  Exam unremarkable, except the chest pain  did seem to be reproducible along the sternum.  A chest x-ray showed mild  peribronchial thickening, no active disease.  EKG showed no acute findings,  questionable prolonged QT on one of the EKGs, not on the other, which was  normal sinus rhythm.  The patient was admitted for rule out MI.   HOSPITAL COURSE:  The patient was admitted for repeat EKGs and serial  enzymes and to monitor for arrhythmias.  The patient was monitored on  telemetry overnight without any arrhythmias, chest pain had resolved, and no  further episodes during hospitalization.  Cardiac enzymes were as first set:  CK 265, CK-MB 1.6, and troponin I less 0.01.  Panel #2 showed a CK of 270,  CK-MB of 1.5, and troponin I less than 0.01, and panel #3 showed a CK of  235, CK-MB of 1.3, and troponin I of 0.01.  Lipid panel was not repeated  as  he had had it done April 03, 2005 with an LDL of 80 at that time and HDL  of 39.  CBC on admission showed white blood cell count of 4.1, hemoglobin of  12.6, MCV of 83.2, and platelet count of 290.  Basic metabolic panel on  admission showed a sodium of 140, potassium 3.8, chloride 104, bicarbonate  29, glucose 101, BUN 8, creatinine 0.8, calcium 9.4.  The patient was felt  to be stable for discharge and was instructed to not take the atenolol any  further and to  resume his Prilosec OTC as an outpatient.  He was given a prescription for  the metoprolol.  The patient was not scheduling an appointment to see Dr.  __________ today, but decided to discharge him after 5 p.m.  He was given  the phone number and instructed to make an appointment within two weeks.      Bonnee Quin, MD  ______________________________  [name], M.D.    AD/MEDQ  D:  06/20/2005  T:  06/21/2005  Job:  [id number]

## 2010-06-30 NOTE — H&P (Signed)
NAME:  Joel Donaldson, Joel Donaldson              ACCOUNT NO.:  [id number]   MEDICAL RECORD NO.:  [id number]          PATIENT TYPE:  INP   LOCATION:  2919                         FACILITY:  [facility]   PHYSICIAN:  Jordan Hawks. Elnoria Howard, MD    DATE OF BIRTH:  Apr 20, 1940   DATE OF ADMISSION:  11/27/2005  DATE OF DISCHARGE:                                HISTORY & PHYSICAL   REASON FOR ADMISSION:  Post polypectomy bleed.   HISTORY OF PRESENT ILLNESS:  This 70 year old gentleman with a past medical  history of hypertension and status post lumbar laminectomy in 1982 is  admitted to the hospital for a post polypectomy bleed.  The patient  underwent a colonoscopy for findings of heme-positive stool and, during the  procedure, was noted to have 9 large polyps.  All of the polyps were  removed.  However, the last polyp, which measures approximately 3 to 4 cm in  size, after the removal, resulted in post polypectomy bleed.  There was  arterial spurting at that time, however, this was able to be controlled and  arrested with injection of epinephrine as well as 6 hemoclips.  The patient  tolerated the procedure well and no other complications were encountered.  At this time, the patient is in stable condition.  He is awake, alert, and  without any complaints.   PAST MEDICAL HISTORY AND PAST SURGICAL HISTORY:  As stated above.   FAMILY HISTORY:  Noncontributory.   SOCIAL HISTORY:  The patient is married without any children.  No tobacco,  alcohol, or illicit drug use.   REVIEW OF SYSTEMS:  Unable to accurately obtain at this time as the patient  is just out of the procedure.   HOME MEDICATIONS:  Hyzaar.   ALLERGIES:  NO KNOWN DRUG ALLERGIES.   PHYSICAL EXAMINATION:  VITAL SIGNS:  Blood pressure 134/96, heart rate 76,  respirations 25.  GENERAL:  The patient is in no acute distress, alert and oriented.  HEENT:  Normocephalic, atraumatic.  Extraocular muscles are intact.  Pupils  are equal, round, and reactive  to light.  NECK:  Supple, no lymphadenopathy.  LUNGS:  Clear to auscultation bilaterally.  CARDIOVASCULAR:  Regular rate and rhythm without murmurs, gallops, or rubs.  ABDOMEN:  Flat, soft, nontender, nondistended.  EXTREMITIES:  No clubbing, cyanosis, or edema.   LABORATORY VALUES:  Pending at this time.   IMPRESSION:  1. Post polypectomy bleed.  2. Multiple large polyps.   At this time, the patient's bleeding has arrested.  But, because of the  large volume of blood that occurred during the procedure, further evaluation  and possible transfusion for blood loss is required during this admission.   PLAN:  1. Admit the patient to step-down.  2. Type and cross match 2 units of packed red blood cells.  3. Follow with CBC every 12 hours.  4. Hold any antihypertensives.      Jordan Hawks Elnoria Howard, MD  Electronically Signed     PDH/MEDQ  D:  11/27/2005  T:  11/28/2005  Job:  [id number]   cc:   Thad Ranger, M.D.

## 2010-06-30 NOTE — H&P (Signed)
NAME:  Joel Donaldson, Joel Donaldson              ACCOUNT NO.:  [id number]   MEDICAL RECORD NO.:  [id number]          PATIENT TYPE:  INP   LOCATION:  4729                         FACILITY:  [facility]   PHYSICIAN:  Sherin Quarry, MD      DATE OF BIRTH:  1940/10/08   DATE OF ADMISSION:  10/22/2004  DATE OF DISCHARGE:                                HISTORY & PHYSICAL   HISTORY OF PRESENT ILLNESS:  Joel Donaldson is a 70 year old man who  generally is in good health. He recalls that last year he was evaluated at  [facility] with an episode of chest pain which was ultimately  attributed to costochondritis.  He was not actually admitted at that time.  He does not recall having had any subsequent episodes of chest pain until  this morning, when shortly after he awakened he began to experience left  parasternal chest aching which was intermittently sharp, but was constant.  It did not radiate. There was no associated dyspnea. He did not notice any  diaphoresis, but he states that he felt nauseated, although he did not  vomit. There was no headache, but he felt somewhat dizzy. He presented to  the [facility] emergency room where received aspirin.  He has been given a  total of three sublingual nitroglycerin with virtual relief of discomfort.  The only information we have so far is that initial cardiac enzymes are  negative, and his electrocardiogram shows a normal sinus rhythm without  ischemic changes. He is admitted at this time to rule out MI and for  evaluation of chest pain.   PAST MEDICAL HISTORY:   MEDICATIONS:  None.   ALLERGIES:  None.   OPERATIONS:  The patient had a lumbar laminectomy in 1986.   MEDICAL ILLNESSES:  None.   FAMILY HISTORY:  The patient's father lived to be 75 years old apparently  died of old age. His mother died at the age of 30. He says that she had  diabetes and may have had heart problems near the time of her death.  He has  no siblings. He does not know of  anyone in the family who has a heart  problem.   SOCIAL HISTORY:  He smokes one pack of cigarettes per day.  He is a retired  Copy.  He apparently tends to drink alcohol in binges. He does not drink  on a daily basis. He estimates that he drinks about one pint of hard liquor  per week.  He states that he has no history of delirium tremens or alcohol-  related seizures.   REVIEW OF SYSTEMS:  HEAD:  Apparently the patient will have intermittent  episodes of headache. EARS/NOSE/THROAT:  Denies earache, sinus pain, or sore  throat. CHEST:  Denies coughing, wheezing, or chest congestion.  CARDIOVASCULAR:  Denies orthopnea, PND, or ankle edema. Otherwise, see  above. GI:  He will occasionally have episodes of indigestion, but he says  he has not had any problems indigestion recently.  He has not had any  obvious acid reflux symptoms. He has not been waking tonight with  indigestion symptoms. He has not had any melena or hematochezia. GU:  Denies  dysuria or urinary frequency. NEUROLOGIC.:  There is no history of seizure  or stroke.  ENDOCRINE:  Denies excessive thirst, urinary frequency, or  nocturia.   PHYSICAL EXAMINATION:  VITAL SIGNS:  Temperature is 99, blood pressure  149/95, pulse is 97, O2 sat is 99%  HEENT:  Within normal limits.  NECK:  Carotids are 2+ without bruits.  CHEST:  Clear.  BACK:  Reveals no CVA or point tenderness.  CARDIOVASCULAR:  Reveals normal S1-S2.  There are no rubs, murmurs or  gallops.  ABDOMEN:  Benign. Normal bowel sounds. No masses or tenderness. No guarding  or rebound.  NEUROLOGIC:  Neurologic testing and examination of extremities is normal.   IMPRESSION:  1.  Chest pain, rule out myocardial infarction.  2.  History of previous laminectomy.  3.  Fifty pack-year smoking history.  4.  Possible history of alcohol abuse.   PLAN:  Will admit the patient for standard rule-out MI protocol.  Will  monitor him on telemetry. Will request a cardiology  consult. This patient  will probably need a Cardiolite study done if his enzymes rule out for  myocardial infarction.  I am concerned about his cigarette smoking and urged  him to discontinue cigarette smoking at this time.  Will need to watch him  for signs of alcohol withdrawal, although, by his history, I think this will  not be a problem.           ______________________________  Sherin Quarry, MD     SY/MEDQ  D:  10/22/2004  T:  10/22/2004  Job:  [id number]   cc:   Dr. Meredeth Ide [facility]

## 2010-06-30 NOTE — Discharge Summary (Signed)
NAME:  Joel Donaldson, Joel Donaldson              ACCOUNT NO.:  [id number]   MEDICAL RECORD NO.:  [id number]          PATIENT TYPE:  INP   LOCATION:  2919                         FACILITY:  [facility]   PHYSICIAN:  Jordan Hawks. Elnoria Howard, MD    DATE OF BIRTH:  November 17, 1940   DATE OF ADMISSION:  11/27/2005  DATE OF DISCHARGE:  11/28/2005                                 DISCHARGE SUMMARY   ADMISSION DIAGNOSIS:  1. Post polypectomy bleed and hypertension.   DISCHARGE DIAGNOSES:  1. Post polypectomy bleed and hypertension.   HISTORY AND PHYSICAL:  Please see the original dictated H&P for full  details.   HOSPITAL COURSE:  Over the past 24 hours, the patient was monitored closely  in the step down unit as well as endoscopy unit when he was waiting for a  bed.  During the course of his stay in the hospital, there was no further  evidence of active bleeding.  He did have one large bloody bowel movement;  however, this is felt to be secondary to residual blood that was not able to  be suctioned during the initial bleeding from the polypectomy site.  Subsequently he had two additional smaller bowel movements and also passing  flatus without any evidence of acute bleeding.  He did have some mild tinges  of fluid within his bowel movements.  The patient remained hemodynamically  stable and has no  complaints of any chest pain or shortness of breath and  he is without complaints at this time.  The plan is for the patient to be  discharged at this time as it is felt to be safe.  His hemoglobin did  decline from 11.8 to 10.4; however, being that he is asymptomatic and there  is no further drop in his hemoglobin, no transfusion will provided at this  time.  The plan is for the patient to be discharged.   FOLLOWUP:  Patient is to follow up with Dr. Elnoria Howard on November 30, 2005 for a  check of his CBC and also in two weeks as previously scheduled.  The patient  is instructed to call immediately if he should have any recurrence  of his  bleeding or if he should have any other questions or concerns.      Jordan Hawks Elnoria Howard, MD  Electronically Signed     PDH/MEDQ  D:  11/28/2005  T:  11/29/2005  Job:  [id number]   cc:   Casimiro Needle L. Thad Ranger, M.D.

## 2010-06-30 NOTE — Discharge Summary (Signed)
NAME:  Joel Donaldson, Joel Donaldson              ACCOUNT NO.:  [id number]   MEDICAL RECORD NO.:  [id number]          PATIENT TYPE:  OBV   LOCATION:  3713                         FACILITY:  [facility]   PHYSICIAN:  Pearlean Brownie, M.D.DATE OF BIRTH:  08-12-1940   DATE OF ADMISSION:  04/02/2005  DATE OF DISCHARGE:  04/03/2005                                 DISCHARGE SUMMARY   PRIMARY CARE PHYSICIAN:  Penni Bombard, M.D.   CONSULTING PHYSICIANS:  None.   PROCEDURES:  None.   DISCHARGE DIAGNOSES:  1.  Noncardiac chest pain.  2.  Hypertension.  3.  Gastroesophageal reflux disease.  4.  History of dizziness.   LABORATORY DATA:  Point of care markers were negative and cardiac enzymes  were negative x3.  The patient's fasting cholesterol on February 20 was 158,  fasting HDL 39, fasting LDL 80.   HOSPITAL COURSE:  This is a 70 year old male who has cardiac risk factors of  hypertension, smoking, being male, who came in complaining of chest pain.   Problem 1.  CHEST PAIN:  The patient described atypical chest pain, was  substernal, it did not change with exercise or rest but was relieved with  nitroglycerin.  He also complained of some nausea and diaphoresis.  The  patient's cardiac enzymes were cycled and they were all negative.  An EKG  showed no change from previous.  The patient had a normal cardiac stress  test in September 2006 and a normal echo in September 2006, which were both  reassuring.  The patient still had minimal chest pain at discharge, but this  chest pain was reproducible with palpation and movement of his arm.  He will  follow up with Dr. Melvyn Novas in a week or two.   Problem 2.  HYPERTENSION:  The patient had run out of his home dose of  hydrochlorothiazide.  He was discharged on 25 mg of hydrochlorothiazide  daily with refills for a year.   Problem 3.  GASTROESOPHAGEAL REFLUX DISEASE:  The patient has a history of  GERD.  He takes Prilosec over-the-counter once daily.   DISCHARGE MEDICATIONS:  1.  Aspirin 81 mg daily.  2.  Hydrochlorothiazide 25 mg daily.  3.  Prilosec over-the-counter one daily.   The patient was a full code during his hospital stay.      Rolm Gala, M.D.    ______________________________  Pearlean Brownie, M.D.    Bennetta Laos  D:  04/03/2005  T:  04/04/2005  Job:  [id number]

## 2010-07-28 ENCOUNTER — Ambulatory Visit (INDEPENDENT_AMBULATORY_CARE_PROVIDER_SITE_OTHER): Payer: PRIVATE HEALTH INSURANCE | Admitting: Family Medicine

## 2010-07-28 ENCOUNTER — Encounter: Payer: Self-pay | Admitting: Family Medicine

## 2010-07-28 DIAGNOSIS — M25519 Pain in unspecified shoulder: Secondary | ICD-10-CM

## 2010-07-28 DIAGNOSIS — M75102 Unspecified rotator cuff tear or rupture of left shoulder, not specified as traumatic: Secondary | ICD-10-CM | POA: Insufficient documentation

## 2010-07-28 DIAGNOSIS — M25512 Pain in left shoulder: Secondary | ICD-10-CM

## 2010-07-28 NOTE — Progress Notes (Signed)
  Subjective:    Patient ID: Joel Donaldson, male    DOB: 04/24/1940, 70 y.o.   MRN: [id number]  HPI  1) Left shoulder pain: x several months. No inciting injury. Worse with lying on affected side. Difficulty with putting on shirt in morning, pain with reaching overhead, reaching behind. Denies swelling, fever, chills, pain elsewhere. Does report history of osteoarthritis. Took some leftover Vicodin which helped relieve pain. Ibuprofen has not worked. Right handed.   Pertinent past history reviewed.   Review of Systems As per HPI     Objective:   Physical Exam General: well appearing, NAD  Musculoskeletal: LEFT SHOULDER:  - No obvious skin changes, swelling or deformity - non-tender to palpation throughout shoulder  - Decreased range of motion with extension (unable to clear 90 degrees), internal rotation, external rotation.  - positive painful arc sign - positive drop arm sign  - positive Hawkins  - positive empty can sign  - weakness with resisted external rotation   ULTRASOUND: Left incomplete supraspinatus tear. Intact infraspinatus, teres minor, subscapularis and biceps tendon     Consent obtained and verified. Procedure performed by Dr. Ihor Austin. Thekke[place]andam. Indication:  Adhesive capsulitis Noted no overlying erythema or induration. Sterile betadine prep. Furthur cleansed with alcohol. Topical analgesic spray: Ethyl chloride. Joint: left glenohumeral Approached in typical fashion with: 25g needle Needle advanced just medial to humerus but lateral to glenoid, capsule punctured and medication injection. There was a significant amount of resistance encountered after only 2-3cc injected further suggesting adhesive capsulitis. Completed without difficulty Meds: 10cc lidocaine 1%, 1cc kenalog 40 Needle: 25g Aftercare instructions and Red flags advised.  Assessment & Plan:

## 2010-07-28 NOTE — Assessment & Plan Note (Signed)
Likely secondary to partial supraspinatus tear (as seen on ultrasound) with subsequent frozen shoulder.  Intra-articular corticosteroid / pain medication injection as above with marked improvement in symptoms. Reviewed exercise handout to prevent frozen shoulder. Follow up in 6 weeks.

## 2010-07-28 NOTE — Patient Instructions (Addendum)
Follow up in 6 weeks with Dr. Madolyn Frieze.  Do the exercises that we have discussed  Take ibuprofen for pain

## 2010-08-03 ENCOUNTER — Telehealth: Payer: Self-pay | Admitting: Family Medicine

## 2010-08-03 MED ORDER — DICLOFENAC SODIUM 1 % TD GEL: 1.0000 "application " | Freq: Four times a day (QID) | TRANSDERMAL | Status: DC

## 2010-08-03 NOTE — Telephone Encounter (Signed)
Was seen last week for shoulder pain and is still having pain.  Wants to know if he can get pain meds

## 2010-08-03 NOTE — Telephone Encounter (Signed)
Will forward to PCP and last MD seen. 

## 2010-08-03 NOTE — Telephone Encounter (Signed)
Discussed with patient. Pain returned last night. Voltaren gel prescription written. Follow up with PCP for further issues.

## 2010-08-11 ENCOUNTER — Telehealth: Payer: Self-pay | Admitting: Family Medicine

## 2010-08-11 DIAGNOSIS — M25512 Pain in left shoulder: Secondary | ICD-10-CM

## 2010-08-11 NOTE — Telephone Encounter (Signed)
Has called back again about his shoulder and would like to speak to someone before the weekend.

## 2010-08-11 NOTE — Telephone Encounter (Signed)
Would like to speak with someone because the injection in his shoulder the other day has not helped and is causing quite a bit of pain.

## 2010-08-29 ENCOUNTER — Other Ambulatory Visit: Payer: Self-pay | Admitting: Family Medicine

## 2010-08-29 DIAGNOSIS — M75102 Unspecified rotator cuff tear or rupture of left shoulder, not specified as traumatic: Secondary | ICD-10-CM

## 2010-08-29 NOTE — Telephone Encounter (Signed)
Patient with confirmed supraspinatous tear on ultrasound.  Pain not alleviated by Voltaren gel or ibuprofen.  Wife's Vicodin helps. Will give 2 weeks of Vicodin. Patient also amenable to physical therapy. Asked patient to pick up Rx for Vicodin tomorrow (07/18) afternoon after 13:30pm and to make follow-up appointment with me in 2 weeks to re-assess shoulder.

## 2010-08-30 MED ORDER — HYDROCODONE-ACETAMINOPHEN 5-500 MG PO TABS: 1.0000 | ORAL_TABLET | Freq: Two times a day (BID) | ORAL | Status: DC | PRN

## 2010-09-07 ENCOUNTER — Ambulatory Visit: Payer: PRIVATE HEALTH INSURANCE | Admitting: Family Medicine

## 2010-09-14 ENCOUNTER — Encounter: Payer: Self-pay | Admitting: Family Medicine

## 2010-09-14 ENCOUNTER — Ambulatory Visit (INDEPENDENT_AMBULATORY_CARE_PROVIDER_SITE_OTHER): Payer: PRIVATE HEALTH INSURANCE | Admitting: Family Medicine

## 2010-09-14 ENCOUNTER — Ambulatory Visit: Payer: PRIVATE HEALTH INSURANCE | Attending: Family Medicine

## 2010-09-14 VITALS — BP 115/68 | HR 109 | Temp 97.9°F | Wt 197.0 lb

## 2010-09-14 DIAGNOSIS — S43429A Sprain of unspecified rotator cuff capsule, initial encounter: Secondary | ICD-10-CM

## 2010-09-14 DIAGNOSIS — IMO0001 Reserved for inherently not codable concepts without codable children: Secondary | ICD-10-CM | POA: Insufficient documentation

## 2010-09-14 DIAGNOSIS — M25519 Pain in unspecified shoulder: Secondary | ICD-10-CM | POA: Insufficient documentation

## 2010-09-14 DIAGNOSIS — M25619 Stiffness of unspecified shoulder, not elsewhere classified: Secondary | ICD-10-CM | POA: Insufficient documentation

## 2010-09-14 DIAGNOSIS — M75102 Unspecified rotator cuff tear or rupture of left shoulder, not specified as traumatic: Secondary | ICD-10-CM

## 2010-09-14 LAB — BASIC METABOLIC PANEL
BUN: 8 mg/dL (ref 6–23)
CO2: 27 mEq/L (ref 19–32)
Calcium: 9.5 mg/dL (ref 8.4–10.5)
Chloride: 104 mEq/L (ref 96–112)
Creat: 0.94 mg/dL (ref 0.50–1.35)

## 2010-09-14 MED ORDER — MELOXICAM 15 MG PO TABS: 15.0000 mg | ORAL_TABLET | Freq: Every day | ORAL | Status: DC

## 2010-09-14 MED ORDER — HYDROCODONE-ACETAMINOPHEN 5-500 MG PO TABS: 1.0000 | ORAL_TABLET | Freq: Three times a day (TID) | ORAL | Status: DC | PRN

## 2010-09-14 NOTE — Progress Notes (Signed)
  Subjective:    Patient ID: Joel Donaldson, male    DOB: 1940/04/08, 70 y.o.   MRN: [id number]  HPI Left shoulder still very bothersome.  Had first PT session today. Now arm is hurting more. Take about 2 Vicocdin daily.  Limitations: cannot do as much yard or weed work. Difficulty lifting things around the house.  Lives with granddaughter who helps him.  Does arm stretches in handout given to him.  Review of Systems No tingling or decreased sensation left arm    Objective:   Physical Exam General: NAD, cradling left arm CV: RRR Shoulder:   Right: full abduction and flexion, 30 deg extension, rotator cuff strength normal throughout, no signs of impingement with empty can   Left: 45 deg abduction and flexion, 10 deg extension, decreased strength (3-4/5), pain with empty can test    Assessment & Plan:

## 2010-09-14 NOTE — Patient Instructions (Addendum)
Take 1/2 tablet of the meloxicam once a day to see if it helps your pain. Ice to arm especially after physical therapy. Do those stretches every day.  Vicodin up to two times a day, but you may take 3 in one day on physical therapy days.  Follow-up in 1 month.

## 2010-09-14 NOTE — Assessment & Plan Note (Signed)
Pain is persistent but not worse. Bad today though because just finished PT earlier today. First session, will continue biweekly for next 4 weeks. Will continue Vicodin during this recuperation period. Asked to take 1 tablet before PT and up to 3 tablets on days of PT, 2 tablets all other days. But need to be cautious in this individual with high alcohol consumption (1 pint whiskey weekly). Encouraged PT and daily stretches to prevent frozen shoulder. Try meloxicam; ibuprofen didn't help. Will check BMET today to get baseline kidney function. Follow-up in 4 weeks after PT is over.

## 2010-09-15 ENCOUNTER — Encounter: Payer: Self-pay | Admitting: Family Medicine

## 2010-09-20 ENCOUNTER — Other Ambulatory Visit: Payer: Self-pay | Admitting: Family Medicine

## 2010-09-20 NOTE — Telephone Encounter (Signed)
Refill request for Dr. Bluford Kaufmann Park's patient

## 2010-09-20 NOTE — Telephone Encounter (Signed)
Refill request

## 2010-09-20 NOTE — Telephone Encounter (Signed)
Refill request for Dr. Oh Park's patient 

## 2010-09-21 ENCOUNTER — Ambulatory Visit: Payer: PRIVATE HEALTH INSURANCE | Admitting: Physical Therapy

## 2010-09-26 ENCOUNTER — Ambulatory Visit: Payer: PRIVATE HEALTH INSURANCE

## 2010-09-28 ENCOUNTER — Ambulatory Visit: Payer: PRIVATE HEALTH INSURANCE

## 2010-10-03 ENCOUNTER — Ambulatory Visit: Payer: PRIVATE HEALTH INSURANCE

## 2010-10-11 ENCOUNTER — Ambulatory Visit: Payer: PRIVATE HEALTH INSURANCE

## 2010-10-11 ENCOUNTER — Encounter: Payer: Self-pay | Admitting: Family Medicine

## 2010-10-11 DIAGNOSIS — C61 Malignant neoplasm of prostate: Secondary | ICD-10-CM

## 2010-10-11 NOTE — Assessment & Plan Note (Signed)
Documentation only. Update from [facility].  UA clean, no protein or blood. Prostate exam: prostate size 2+ with no nodularity or induration; rectal exam normal.  Will f/u in 57mo (Nov) and re-check PSA at tha ttime.

## 2010-10-13 ENCOUNTER — Ambulatory Visit (INDEPENDENT_AMBULATORY_CARE_PROVIDER_SITE_OTHER): Payer: PRIVATE HEALTH INSURANCE | Admitting: Family Medicine

## 2010-10-13 ENCOUNTER — Encounter: Payer: Self-pay | Admitting: Family Medicine

## 2010-10-13 VITALS — BP 104/68 | HR 98 | Temp 98.0°F | Wt 198.0 lb

## 2010-10-13 DIAGNOSIS — M75102 Unspecified rotator cuff tear or rupture of left shoulder, not specified as traumatic: Secondary | ICD-10-CM

## 2010-10-13 DIAGNOSIS — S43429A Sprain of unspecified rotator cuff capsule, initial encounter: Secondary | ICD-10-CM

## 2010-10-13 MED ORDER — OXYCODONE HCL 10 MG PO TB12: 10.0000 mg | ORAL_TABLET | Freq: Three times a day (TID) | ORAL | Status: DC

## 2010-10-13 NOTE — Assessment & Plan Note (Signed)
Persistent and particularly bad after PT. Will D/C meloxicam and Vicodin and change to oxycodone. Patient has been getting about 10mg  equivalent of oxycodone daily. Will triple this amount so that he can do physical therapy. Will follow-up in 1 week to re-assess pain.

## 2010-10-13 NOTE — Patient Instructions (Addendum)
Stop taking the meloxicam and the Vicodin. Let's try oxycodone every 8 hours as needed for your pain. Continue to do daily stretches and ice/warm shoulder as needed.  Follow-up in 1 week.

## 2010-10-13 NOTE — Progress Notes (Signed)
  Subjective:    Patient ID: Joel Donaldson, male    DOB: Jul 18, 1940, 70 y.o.   MRN: [id number]  HPI 1. Torn left rotator cuff Persistent left shoulder pain.  Significantly worse after physical therapy; did not go today because of the pain Requesting something stronger than what he is currently taking Vicodin helps but does not last long and pain is severe after PT. No relief with Mobic  Review of Systems Denies fevers    Objective:   Physical Exam Gen: NAD Right shoulder: full ROM, no TTP Left shoulder: passive ROM: abduction about 100 degrees, flexion about 100 degrees, extension about 45 degrees; ROM limited by pain; sharp pain with internal rotation    Assessment & Plan:

## 2010-10-17 ENCOUNTER — Ambulatory Visit: Payer: PRIVATE HEALTH INSURANCE | Attending: Family Medicine

## 2010-10-17 DIAGNOSIS — M25619 Stiffness of unspecified shoulder, not elsewhere classified: Secondary | ICD-10-CM | POA: Insufficient documentation

## 2010-10-17 DIAGNOSIS — IMO0001 Reserved for inherently not codable concepts without codable children: Secondary | ICD-10-CM | POA: Insufficient documentation

## 2010-10-17 DIAGNOSIS — M25519 Pain in unspecified shoulder: Secondary | ICD-10-CM | POA: Insufficient documentation

## 2010-10-19 ENCOUNTER — Ambulatory Visit: Payer: PRIVATE HEALTH INSURANCE

## 2010-10-20 ENCOUNTER — Ambulatory Visit (INDEPENDENT_AMBULATORY_CARE_PROVIDER_SITE_OTHER): Payer: PRIVATE HEALTH INSURANCE | Admitting: Family Medicine

## 2010-10-20 ENCOUNTER — Encounter: Payer: Self-pay | Admitting: Family Medicine

## 2010-10-20 VITALS — BP 85/65 | HR 101 | Temp 98.0°F | Wt 194.6 lb

## 2010-10-20 DIAGNOSIS — C61 Malignant neoplasm of prostate: Secondary | ICD-10-CM

## 2010-10-20 DIAGNOSIS — R1031 Right lower quadrant pain: Secondary | ICD-10-CM | POA: Insufficient documentation

## 2010-10-20 DIAGNOSIS — S43429A Sprain of unspecified rotator cuff capsule, initial encounter: Secondary | ICD-10-CM

## 2010-10-20 DIAGNOSIS — M75102 Unspecified rotator cuff tear or rupture of left shoulder, not specified as traumatic: Secondary | ICD-10-CM

## 2010-10-20 MED ORDER — OXYCODONE HCL 10 MG PO TB12: 10.0000 mg | ORAL_TABLET | Freq: Three times a day (TID) | ORAL | Status: DC

## 2010-10-20 NOTE — Assessment & Plan Note (Signed)
New problem. No red flags and UTD on colonoscopy. Possibly due to gas. Encouraged regular exercise. Offered medication for gas but patient denied.

## 2010-10-20 NOTE — Assessment & Plan Note (Signed)
Markedly improved. Finished 4 weeks of physical therapy. But improvement on PE may also be due to pain being under better control with oxycodone. Recommended stretching shoulder bid at one. Continue oxycodone. Filled out pain contract. Follow-up in 1 month to re-assess need for this medication.

## 2010-10-20 NOTE — Progress Notes (Signed)
  Subjective:    Patient ID: Joel Donaldson, male    DOB: 1940/08/30, 70 y.o.   MRN: [id number]  HPI 1. L shoulder rotator cuff tear Finished PT Feeling better Oxycodone helps pain, able to endure PT. Takes 3 tablets daily.   2. Left lower quadrant stomach pain Started few days ago Achy pain Had colonoscopy 2009 and per patient another one a few weeks ago, which he was told was normal Lots of flatus  ROS: denies constipation, sometimes stools are loose, denies blood in stool, fevers/chills, nausea/vomiting  Review of Systems    Objective:   Physical Exam Gen: NAD Psych: pleasant, not anxious- or depressed-appearing, on the quiet side Shoulder:   L shoulder almost full ROM with abduction and flexion, extension about 75% compared to right side    Assessment & Plan:

## 2010-10-20 NOTE — Patient Instructions (Addendum)
I'm glad your shoulder is doing better! Continue to do shoulder stretches twice a day for the next month.  Try walking 30 minutes a few times a week to see if that helps your gas stomach pains.   Follow-up with me in 1 month.

## 2010-10-23 ENCOUNTER — Inpatient Hospital Stay (INDEPENDENT_AMBULATORY_CARE_PROVIDER_SITE_OTHER)
Admission: RE | Admit: 2010-10-23 | Discharge: 2010-10-23 | Disposition: A | Discharge status: Home / Self Care | Payer: PRIVATE HEALTH INSURANCE | Source: Ambulatory Visit | Attending: Family Medicine | Admitting: Family Medicine

## 2010-10-23 ENCOUNTER — Ambulatory Visit (INDEPENDENT_AMBULATORY_CARE_PROVIDER_SITE_OTHER): Payer: PRIVATE HEALTH INSURANCE

## 2010-10-23 DIAGNOSIS — R109 Unspecified abdominal pain: Secondary | ICD-10-CM

## 2010-10-23 LAB — POCT URINALYSIS DIP (DEVICE)
Bilirubin Urine: NEGATIVE
Leukocytes, UA: NEGATIVE
Nitrite: NEGATIVE
Protein, ur: NEGATIVE mg/dL
pH: 6 (ref 5.0–8.0)

## 2010-10-25 ENCOUNTER — Emergency Department (HOSPITAL_COMMUNITY): Payer: PRIVATE HEALTH INSURANCE

## 2010-10-25 ENCOUNTER — Emergency Department (HOSPITAL_COMMUNITY)
Admission: EM | Admit: 2010-10-25 | Discharge: 2010-10-25 | Disposition: A | Discharge status: Home / Self Care | Payer: PRIVATE HEALTH INSURANCE | Attending: Emergency Medicine | Admitting: Emergency Medicine

## 2010-10-25 DIAGNOSIS — R112 Nausea with vomiting, unspecified: Secondary | ICD-10-CM | POA: Insufficient documentation

## 2010-10-25 DIAGNOSIS — M129 Arthropathy, unspecified: Secondary | ICD-10-CM | POA: Insufficient documentation

## 2010-10-25 DIAGNOSIS — K219 Gastro-esophageal reflux disease without esophagitis: Secondary | ICD-10-CM | POA: Insufficient documentation

## 2010-10-25 DIAGNOSIS — R1032 Left lower quadrant pain: Secondary | ICD-10-CM | POA: Insufficient documentation

## 2010-10-25 DIAGNOSIS — I10 Essential (primary) hypertension: Secondary | ICD-10-CM | POA: Insufficient documentation

## 2010-10-25 DIAGNOSIS — Z79899 Other long term (current) drug therapy: Secondary | ICD-10-CM | POA: Insufficient documentation

## 2010-10-25 LAB — URINALYSIS, ROUTINE W REFLEX MICROSCOPIC
Bilirubin Urine: NEGATIVE
Ketones, ur: NEGATIVE mg/dL
Leukocytes, UA: NEGATIVE
Nitrite: NEGATIVE
Protein, ur: NEGATIVE mg/dL
Urobilinogen, UA: 0.2 mg/dL (ref 0.0–1.0)

## 2010-10-25 LAB — COMPREHENSIVE METABOLIC PANEL
Albumin: 3.7 g/dL (ref 3.5–5.2)
BUN: 11 mg/dL (ref 6–23)
Calcium: 9.5 mg/dL (ref 8.4–10.5)
Creatinine, Ser: 0.93 mg/dL (ref 0.50–1.35)
Total Bilirubin: 0.4 mg/dL (ref 0.3–1.2)
Total Protein: 6.8 g/dL (ref 6.0–8.3)

## 2010-10-25 LAB — CBC
HCT: 40.5 % (ref 39.0–52.0)
Hemoglobin: 13.8 g/dL (ref 13.0–17.0)
MCV: 82.5 fL (ref 78.0–100.0)
Platelets: 235 10*3/uL (ref 150–400)
RBC: 4.91 MIL/uL (ref 4.22–5.81)
WBC: 3.1 10*3/uL — ABNORMAL LOW (ref 4.0–10.5)

## 2010-10-25 MED ORDER — IOHEXOL 300 MG/ML  SOLN
100.0000 mL | Freq: Once | INTRAMUSCULAR | Status: AC | PRN
  Administered 2010-10-25: 100 mL via INTRAVENOUS

## 2010-10-26 ENCOUNTER — Encounter: Payer: Self-pay | Admitting: Family Medicine

## 2010-10-26 DIAGNOSIS — M75102 Unspecified rotator cuff tear or rupture of left shoulder, not specified as traumatic: Secondary | ICD-10-CM

## 2010-10-26 NOTE — Assessment & Plan Note (Signed)
Documentation only. Patient received 20 tablets of Percocet in the ED for abdominal pain that resolved while in the ED. CT abdomen showed no GI obstruction but did show 6mm kidney stone on one side. Patient is under pain contract! Will be very hesitant about giving more oxycodone next month at follow-up.

## 2010-10-27 ENCOUNTER — Telehealth: Payer: Self-pay | Admitting: Family Medicine

## 2010-10-27 NOTE — Telephone Encounter (Signed)
Wants to be referred about his stomach pains - he has been here/ UC & Ed and no one can determine what he problem is.  Continuing to have stomach problems.  pls advise

## 2010-10-27 NOTE — Telephone Encounter (Signed)
To MD

## 2010-10-30 ENCOUNTER — Telehealth: Payer: Self-pay | Admitting: Family Medicine

## 2010-10-30 DIAGNOSIS — R1031 Right lower quadrant pain: Secondary | ICD-10-CM

## 2010-10-30 NOTE — Telephone Encounter (Signed)
Jess, please notify patient that will send in referral. But may take time. Recommend he see me in the meantime if he continues to have problems.

## 2010-10-30 NOTE — Telephone Encounter (Signed)
See referral order Fle[place], Maryjo Rochester

## 2010-10-30 NOTE — Telephone Encounter (Signed)
Was in on 9/7 and is still having the same problems.  He is wanting a referral to have a test run to find out what is going on.

## 2010-10-31 ENCOUNTER — Telehealth: Payer: Self-pay | Admitting: Family Medicine

## 2010-10-31 NOTE — Telephone Encounter (Signed)
Patient requesting referral for colonoscopy, says he had to changed his medicaid to have [facility] on his card, this will be effective 11/13/10.

## 2010-10-31 NOTE — Telephone Encounter (Signed)
See referral order Marenda Accardi Dawn  

## 2010-11-06 LAB — BASIC METABOLIC PANEL
BUN: 16
Calcium: 9.1
Chloride: 102
Creatinine, Ser: 0.93
Creatinine, Ser: 0.97
GFR calc Af Amer: >60
GFR calc non Af Amer: >60
Potassium: 3.5
Sodium: 137

## 2010-11-06 LAB — CBC
HCT: 41
Hemoglobin: 12.5 — ABNORMAL LOW
Hemoglobin: 13.8
MCV: 87.2
MCV: 87.3
Platelets: 232
RBC: 4.22
WBC: 3.8 — ABNORMAL LOW

## 2010-11-06 LAB — POCT I-STAT, CHEM 8
BUN: 20
Calcium, Ion: 1.19
Chloride: 104
Glucose, Bld: 85
TCO2: 26

## 2010-11-06 LAB — POCT CARDIAC MARKERS
CKMB, poc: <1 — ABNORMAL LOW
Myoglobin, poc: 82.1
Operator id: 114141
Troponin i, poc: <0.05
Troponin i, poc: <0.05

## 2010-11-06 LAB — CARDIAC PANEL(CRET KIN+CKTOT+MB+TROPI)
Relative Index: 1.1
Relative Index: 1.3
Troponin I: 0.01
Troponin I: 0.01

## 2010-11-06 LAB — CK TOTAL AND CKMB (NOT AT ARMC): Total CK: 125

## 2010-11-21 ENCOUNTER — Encounter: Payer: Self-pay | Admitting: Family Medicine

## 2010-11-21 DIAGNOSIS — M75102 Unspecified rotator cuff tear or rupture of left shoulder, not specified as traumatic: Secondary | ICD-10-CM

## 2010-11-21 DIAGNOSIS — R1031 Right lower quadrant pain: Secondary | ICD-10-CM

## 2010-11-21 NOTE — Assessment & Plan Note (Signed)
Documentation only. Patient referred to GI for this issue. Seen by Dr. Elnoria Howard on 11/15/2010.  Unable to elicit pain at that time and suspects musculoskeletal problem.  Prior colonoscopy reports do now show diverticula.  -No further work-up at this time. Follow-up prn.

## 2010-11-21 NOTE — Assessment & Plan Note (Signed)
Documentation only. Patient discharged from PT after undergoing 8 sessions from Aug-Sept 2012 due to not coming.

## 2010-11-24 LAB — POCT CARDIAC MARKERS
Myoglobin, poc: 133
Operator id: 288331
Troponin i, poc: <0.05
Troponin i, poc: <0.05

## 2010-11-24 LAB — I-STAT 8, (EC8 V) (CONVERTED LAB)
Bicarbonate: 25.9 — ABNORMAL HIGH
Glucose, Bld: 95
TCO2: 27
pCO2, Ven: 46.3
pH, Ven: 7.356 — ABNORMAL HIGH

## 2010-11-24 LAB — CBC
HCT: 41.3
Platelets: 283
WBC: 4.9

## 2010-11-24 LAB — POCT I-STAT CREATININE: Operator id: 288331

## 2010-11-24 LAB — DIFFERENTIAL
Eosinophils Relative: 2
Lymphocytes Relative: 29
Lymphs Abs: 1.4
Neutrophils Relative %: 53

## 2010-11-27 ENCOUNTER — Ambulatory Visit (INDEPENDENT_AMBULATORY_CARE_PROVIDER_SITE_OTHER): Payer: PRIVATE HEALTH INSURANCE | Admitting: Family Medicine

## 2010-11-27 ENCOUNTER — Other Ambulatory Visit: Payer: Self-pay

## 2010-11-27 ENCOUNTER — Telehealth: Payer: Self-pay | Admitting: Family Medicine

## 2010-11-27 ENCOUNTER — Ambulatory Visit (HOSPITAL_COMMUNITY)
Admission: RE | Admit: 2010-11-27 | Discharge: 2010-11-27 | Disposition: A | Discharge status: Home / Self Care | Payer: PRIVATE HEALTH INSURANCE | Source: Ambulatory Visit | Attending: Family Medicine | Admitting: Family Medicine

## 2010-11-27 VITALS — BP 112/78 | HR 106 | Temp 98.0°F | Ht 70.0 in | Wt 191.0 lb

## 2010-11-27 DIAGNOSIS — R55 Syncope and collapse: Secondary | ICD-10-CM

## 2010-11-27 DIAGNOSIS — R0781 Pleurodynia: Secondary | ICD-10-CM

## 2010-11-27 DIAGNOSIS — S2239XA Fracture of one rib, unspecified side, initial encounter for closed fracture: Secondary | ICD-10-CM | POA: Insufficient documentation

## 2010-11-27 DIAGNOSIS — I498 Other specified cardiac arrhythmias: Secondary | ICD-10-CM | POA: Insufficient documentation

## 2010-11-27 DIAGNOSIS — R079 Chest pain, unspecified: Secondary | ICD-10-CM | POA: Insufficient documentation

## 2010-11-27 MED ORDER — OXYCODONE-ACETAMINOPHEN 5-325 MG PO TABS: 1.0000 | ORAL_TABLET | Freq: Four times a day (QID) | ORAL | Status: DC | PRN

## 2010-11-27 MED ORDER — ONDANSETRON HCL 4 MG PO TABS: 4.0000 mg | ORAL_TABLET | Freq: Three times a day (TID) | ORAL | Status: AC | PRN

## 2010-11-27 NOTE — Assessment & Plan Note (Signed)
With normal neurologic and cardiac eval 2 years ago, will not repeat today.  Rhythm normal on EKG today.  Not orthostatic.  Likely 2/2 alcohol intake.  Counseled on alcohol use. To see PCP in one week.

## 2010-11-27 NOTE — Telephone Encounter (Signed)
Patient informed. 

## 2010-11-27 NOTE — Patient Instructions (Signed)
It was nice to meet you Please be very careful with your alcohol consumption---do not drink with the oxycodone.  Please come back in 1 week to see Dr. Madolyn Frieze

## 2010-11-27 NOTE — Telephone Encounter (Signed)
Sent in zofran

## 2010-11-27 NOTE — Progress Notes (Signed)
  Subjective:    Patient ID: Joel Donaldson, male    DOB: [birth year], 70 y.o.   MRN: [id number]  HPI Rib pain- pt fell on Saturday and hurt his left ribs.  He says that he was sleeping in his chair and woke up and got up to go to bed. The next thing he knew he was getting up off the floor.  He thinks he lost consciousness.  He did not have any warning.  He is a drinker and that night he had drunk about 3-4 glasses (one very large glass) of wine.  He thinks he was a little tipsy.   He now notes chest pain when he moves and some nausea.  He denies SOB. I reviewed his w/u for syncope in 2010.  He had a normal holter, normal stress test, normal EEG.  Since that time he has not had episodes. This is an isolated incident now.     Review of Systems He denies fevers, V/D, HA    Objective:   Physical Exam Vital signs reviewed General appearance - alert, well appearing, and in no distress and oriented to person, place, and time Heart - normal rate, regular rhythm, normal S1, S2, no murmurs, rubs, clicks or gallops Chest - clear to auscultation, no wheezes, rales or rhonchi, symmetric air entry, no tachypnea, retractions or cyanosis--there is a small step off and pain with palpation at the left 5th rib Extremities - peripheral pulses normal, no pedal edema, no clubbing or cyanosis         Assessment & Plan:

## 2010-11-27 NOTE — Telephone Encounter (Signed)
Pt is asking for something for his nausea [facility]

## 2010-11-27 NOTE — Assessment & Plan Note (Signed)
Rib pain due to likely fracture.  Noted pain contract but gave percocet for this acute episode.  To see Oh Park in one week.

## 2010-12-08 ENCOUNTER — Encounter: Payer: Self-pay | Admitting: Family Medicine

## 2010-12-08 ENCOUNTER — Ambulatory Visit (INDEPENDENT_AMBULATORY_CARE_PROVIDER_SITE_OTHER): Payer: PRIVATE HEALTH INSURANCE | Admitting: Family Medicine

## 2010-12-08 VITALS — BP 79/50 | HR 96 | Temp 98.3°F | Ht 70.0 in | Wt 191.2 lb

## 2010-12-08 DIAGNOSIS — Z23 Encounter for immunization: Secondary | ICD-10-CM

## 2010-12-08 DIAGNOSIS — R0781 Pleurodynia: Secondary | ICD-10-CM

## 2010-12-08 DIAGNOSIS — R079 Chest pain, unspecified: Secondary | ICD-10-CM

## 2010-12-08 MED ORDER — OXYCODONE-ACETAMINOPHEN 5-325 MG PO TABS: 1.0000 | ORAL_TABLET | Freq: Four times a day (QID) | ORAL | Status: DC | PRN

## 2010-12-08 NOTE — Patient Instructions (Signed)
Please go to [facility] or [facility] for an x-ray of your ribs. Please get this done today or tomorrow if possible.   Follow-up with me in 1 week.

## 2010-12-08 NOTE — Assessment & Plan Note (Signed)
Persistent left rib pain. May have felt step-off that Dr. Hulen Luster noted earlier. Will send to get x-rays to confirm to rationalize use or narcotics for his rib pain. He has been on narcotics for a few months now due to different problems (previously it was due to a rotator cuff injury), and I am concerned of his regular use of narcotics. Will follow-up with me in 1 week. Will give more percocet in the mean time. Given 20 tablets and it has lasted him 13 days.

## 2010-12-08 NOTE — Progress Notes (Signed)
  Subjective:    Patient ID: Joel Donaldson, male    DOB: [birth year], 70 y.o.   MRN: [id number]  HPI Here to follow-up on probable left rib fracture.  Pain may be better than it was at his initial visit for this problem on 10/15.  Still 7/10. Worse when he moves that side. Has been taking about 3 percocet daily. ROS: denies difficulty breathing, fevers, nausea/vomiting  2. Left rotator cuff injury Pain is improved. Able to more his arm almost back to normal.   Review of Systems Per HPI    Objective:   Physical Exam Gen: NAD Pulm: CTAB, no increased WOB Shoulder: almost full ROM with flexion, abduction, and extension of his left arm compared to his right but does start experiencing pain when almost at full ROM; 4-5/5 strength bilateral upper extremities, may be still slightly weaker on left compared to right but equivalent grip strength. Chest: moderate tenderness along rib under nipple without guarding; no erythema    Assessment & Plan:

## 2010-12-11 ENCOUNTER — Ambulatory Visit
Admission: RE | Admit: 2010-12-11 | Discharge: 2010-12-11 | Disposition: A | Discharge status: Home / Self Care | Payer: PRIVATE HEALTH INSURANCE | Source: Ambulatory Visit | Attending: Family Medicine | Admitting: Family Medicine

## 2010-12-11 ENCOUNTER — Other Ambulatory Visit: Payer: Self-pay | Admitting: Family Medicine

## 2010-12-11 DIAGNOSIS — R0781 Pleurodynia: Secondary | ICD-10-CM

## 2010-12-15 ENCOUNTER — Ambulatory Visit (INDEPENDENT_AMBULATORY_CARE_PROVIDER_SITE_OTHER): Payer: PRIVATE HEALTH INSURANCE | Admitting: Family Medicine

## 2010-12-15 ENCOUNTER — Encounter: Payer: Self-pay | Admitting: Family Medicine

## 2010-12-15 DIAGNOSIS — S2239XA Fracture of one rib, unspecified side, initial encounter for closed fracture: Secondary | ICD-10-CM

## 2010-12-15 MED ORDER — HYDROCODONE-ACETAMINOPHEN 5-500 MG PO TABS: 1.0000 | ORAL_TABLET | ORAL | Status: DC | PRN

## 2010-12-15 MED ORDER — RIB BELT/MENS LARGE MISC: 1.0000 | Freq: Once | Status: DC

## 2010-12-15 NOTE — Patient Instructions (Signed)
I am sorry you're still having pain from his rib. I would like you to go pick up a rib belt at the [facility]. I am going to give you some Vicodin to take once a day for the worse pain. This Vicodin should last you 2 weeks. I think that you should take scheduled Tylenol. I would like you to go get the Tylenol arthritis 500 mg extended-release. You should take this 2 or 3 times a day.

## 2010-12-15 NOTE — Progress Notes (Signed)
  Subjective:    Patient ID: Joel Donaldson, male    DOB: Aug 30, 1940, 70 y.o.   MRN: [id number]  HPI Joel Donaldson is here to followup on his rib pain. He reports that the pain is about the same. He is taking Percocet 3 times a day. He denies any dizziness with the Percocet. He wanted to followup on his x-rays which did show a rib fracture. Interested in getting a rib belt for comfort.   Review of Systems Denies SOB, HA, N/V/D, fever     Objective:   Physical Exam Vital signs reviewed General appearance - alert, well appearing, and in no distress and oriented to person, place, and time Chest - clear to auscultation, no wheezes, rales or rhonchi, symmetric air entry, no tachypnea, retractions or cyanosis Heart - normal rate, regular rhythm, normal S1, S2, no murmurs, rubs, clicks or gallops Musculoskeletal-patient still tender to palpation on the left lower rib. No sign of splinting. Rib belt is applied and patient said he felt better.       Assessment & Plan:

## 2010-12-15 NOTE — Assessment & Plan Note (Signed)
Agree with exercising caution with narcotics in this patient. Reduced his narcotics to Vicodin 1 tab per day. Gave her 14 tablets. This was to last him 2 weeks. I also gave him a rib belt today for comfort. I advised him to take long-acting Tylenol 2-3 times a day. I asked him to see Dr. Madolyn Frieze in 2 weeks for followup.

## 2010-12-29 ENCOUNTER — Ambulatory Visit (INDEPENDENT_AMBULATORY_CARE_PROVIDER_SITE_OTHER): Payer: Medicaid Other | Admitting: Family Medicine

## 2010-12-29 ENCOUNTER — Encounter: Payer: Self-pay | Admitting: Family Medicine

## 2010-12-29 DIAGNOSIS — S2239XA Fracture of one rib, unspecified side, initial encounter for closed fracture: Secondary | ICD-10-CM

## 2010-12-29 DIAGNOSIS — I10 Essential (primary) hypertension: Secondary | ICD-10-CM

## 2010-12-29 DIAGNOSIS — M75102 Unspecified rotator cuff tear or rupture of left shoulder, not specified as traumatic: Secondary | ICD-10-CM

## 2010-12-29 DIAGNOSIS — S43429A Sprain of unspecified rotator cuff capsule, initial encounter: Secondary | ICD-10-CM

## 2010-12-29 MED ORDER — LISINOPRIL-HYDROCHLOROTHIAZIDE 20-12.5 MG PO TABS: 1.0000 | ORAL_TABLET | Freq: Every day | ORAL | Status: DC

## 2010-12-29 MED ORDER — NAPROXEN 250 MG PO TABS: 250.0000 mg | ORAL_TABLET | Freq: Two times a day (BID) | ORAL | Status: DC

## 2010-12-29 MED ORDER — ALBUTEROL 90 MCG/ACT IN AERS: 1.0000 | INHALATION_SPRAY | RESPIRATORY_TRACT | Status: DC | PRN

## 2010-12-29 NOTE — Patient Instructions (Addendum)
For your left shoulder pain, try the naproxen, one tablet a day for the next several days. Please also make an appointment at the Sports Medicine clinic to have your shoulder evaluated.   Follow-up with me in 2 months to evaluate your blood pressure.

## 2010-12-29 NOTE — Assessment & Plan Note (Signed)
Blood pressure on lower side today but denies any symptoms of hypotension. No dizziness/falls. Will re-assess at next follow-up due to recorded elevated blood pressures with systolics in the 180s on this regimen.

## 2010-12-29 NOTE — Assessment & Plan Note (Signed)
Healing well.

## 2010-12-29 NOTE — Progress Notes (Signed)
  Subjective:    Patient ID: Joel Donaldson, male    DOB: 1940/04/20, 70 y.o.   MRN: [id number]  HPI 1. Right rib pain Resolved  2. Left shoulder pain, known rotator cuff tear diagnosed 07/2010 Pain is now worse now that right rib pain is resolving Narcotics for right rib pain have helped Denies weakness or tingling Would like to see a specialist regarding this  3. Handicap placard request  Wants this renewed Says his bilateral chronic knee pain makes it difficult for him to walk Does not use wheelchair, walker, cane  Review of Systems Denies chest pain, difficulty breathing    Objective:   Physical Exam Gen: NAD Psych: became irritated when I refused to renew the placard; appropriate to questions; does not appear anxious or depressed Left shoulder: full ROM compared to right with passive movement; moderate tenderness to palpation along anterior shoulder/bicipital groove; strength and sensation intact compared to right Neuro: Gait slow but without obvious abnormalities Knees: no swelling or tenderness    Assessment & Plan:

## 2010-12-29 NOTE — Assessment & Plan Note (Signed)
Persistent. Actually patient noticing pain more now that pain from rib fracture improved.  Requesting narcotics. Allergic to Tramadol.  He does not have obvious limited ROM on physical exam or other marked deficits.  I am concerned he is becoming dependent on his narcotics. Denies narcotics today. Will give naproxen.  Will refer to Sports Medicine clinic since he is requesting to see a specialist and also for their recommendations regarding this issue.

## 2011-01-22 ENCOUNTER — Other Ambulatory Visit: Payer: Self-pay | Admitting: Family Medicine

## 2011-01-22 NOTE — Telephone Encounter (Signed)
Refill request

## 2011-05-03 ENCOUNTER — Encounter: Payer: Self-pay | Admitting: Home Health Services

## 2011-05-03 ENCOUNTER — Ambulatory Visit (INDEPENDENT_AMBULATORY_CARE_PROVIDER_SITE_OTHER): Payer: PRIVATE HEALTH INSURANCE | Admitting: Home Health Services

## 2011-05-03 VITALS — BP 126/81 | HR 83 | Temp 98.5°F | Ht 70.0 in | Wt 194.0 lb

## 2011-05-03 DIAGNOSIS — Z0000 Encounter for general adult medical examination without abnormal findings: Secondary | ICD-10-CM

## 2011-05-03 NOTE — Progress Notes (Signed)
Patient here for annual wellness visit, patient reports: Risk Factors/Conditions needing evaluation or treatment: Pt does not have any risk factors that need evaluation. Home Safety: Pt lives with wife and granddaughter in 1 story home.  Pt reports having smoke detectors and does not have adaptive equipment in bathroom. Other Information: Corrective lens: Pt wears daily corrective lens, visits eye dr annually. Dentures: Pt has full dentures. Memory: Pt denies memory problems. Patient's Mini Mental Score (recorded in doc. flowsheet): 22-  Pt has 11 grade education, results may not accurately reflect cognitive ability.   Balance/Gait: Pt reports a lot of pain in the legs Balance Abnormal Patient value  Sitting balance    Sit to stand    Attempts to arise x Uses hands  Immediate standing balance    Standing balance    Nudge    Eyes closed- Romberg    Tandem stance x unable  Back lean    Neck Rotation    360 degree turn    Sitting down x Uses hands   Gait Abnormal Patient value  Initiation of gait    Step length-left    Step length-right    Step height-left    Step height-right    Step symmetry    Step continuity x Pt has stiff knee, lower leg joints  Path deviation    Trunk movement    Walking stance        Annual Wellness Visit Requirements Recorded Today In  Medical, family, social history Past Medical, Family, Social History Section  Current providers Care team  Current medications Medications  Wt, BP, Ht, BMI Vital signs  Tobacco, alcohol, illicit drug use History  ADL Nurse Assessment  Depression Screening Nurse Assessment  Cognitive impairment Nurse Assessment  Mini Mental Status Document Flowsheet  Fall Risk Nurse Assessment  Home Safety Progress Note  End of Life Planning (welcome visit) Social Documentation  Medicare preventative services Progress Note  Risk factors/conditions needing evaluation/treatment Progress Note  Personalized health advice Patient  Instructions, goals, letter  Diet & Exercise Social Documentation  Emergency Contact Social Documentation  Seat Belts Social Documentation  Sun exposure/protection Social Documentation    Medicare Prevention Plan:   Recommended Medicare Prevention Screenings Men over 65 Test For Frequency Date of Last- BOLD if needed  Colorectal Cancer 1-10 yrs 3/09  Prostate Cancer Never or yearly 5/10  Aortic Aneurysm Once if 65-75 with hx of smoking NI  Cholesterol 5 yrs 4/11  Diabetes yearly 9/12  HIV yearly declined  Influenza Shot yearly 10/12  Pneumonia Shot once 9/07  Zostavax Shot once recommended

## 2011-05-03 NOTE — Patient Instructions (Signed)
1. Focus on eating fruits and vegetables every day. 2. Try limiting fried foods. 3. Consider walking your street at least 1 x a week. 4. Consider drinking just 1 glass of a wine a day. 5. Start taking your blood pressure 2-3 times a week and keep a record. 6. Consider getting shingles vaccine at your pharmacy.

## 2011-05-04 NOTE — Progress Notes (Signed)
I have reviewed this visit and discussed with Suzanne Lineberry and agree with her documentation  

## 2011-05-17 ENCOUNTER — Ambulatory Visit: Payer: PRIVATE HEALTH INSURANCE | Admitting: Family Medicine

## 2011-06-25 ENCOUNTER — Encounter: Payer: Self-pay | Admitting: Family Medicine

## 2011-06-25 DIAGNOSIS — C61 Malignant neoplasm of prostate: Secondary | ICD-10-CM

## 2011-06-25 NOTE — Assessment & Plan Note (Signed)
Documentation only. Prostate biopsy ultrasound 05/22.

## 2011-06-26 ENCOUNTER — Other Ambulatory Visit: Payer: Self-pay | Admitting: *Deleted

## 2011-06-26 MED ORDER — OMEPRAZOLE 40 MG PO CPDR: 40.0000 mg | DELAYED_RELEASE_CAPSULE | Freq: Every day | ORAL | Status: DC

## 2011-06-26 MED ORDER — LISINOPRIL-HYDROCHLOROTHIAZIDE 20-12.5 MG PO TABS: 1.0000 | ORAL_TABLET | Freq: Every day | ORAL | Status: DC

## 2011-07-02 ENCOUNTER — Ambulatory Visit (INDEPENDENT_AMBULATORY_CARE_PROVIDER_SITE_OTHER): Payer: PRIVATE HEALTH INSURANCE | Admitting: Family Medicine

## 2011-07-02 ENCOUNTER — Encounter: Payer: Self-pay | Admitting: Family Medicine

## 2011-07-02 VITALS — BP 121/76 | HR 94 | Temp 98.2°F | Ht 70.0 in | Wt 187.0 lb

## 2011-07-02 DIAGNOSIS — M25562 Pain in left knee: Secondary | ICD-10-CM

## 2011-07-02 DIAGNOSIS — M170 Bilateral primary osteoarthritis of knee: Secondary | ICD-10-CM | POA: Insufficient documentation

## 2011-07-02 DIAGNOSIS — M25569 Pain in unspecified knee: Secondary | ICD-10-CM

## 2011-07-02 MED ORDER — ALBUTEROL SULFATE HFA 108 (90 BASE) MCG/ACT IN AERS: 2.0000 | INHALATION_SPRAY | Freq: Four times a day (QID) | RESPIRATORY_TRACT | Status: DC | PRN

## 2011-07-02 NOTE — Assessment & Plan Note (Addendum)
Persistent. Consistent with osteoarthritis.  Aspiration performed and glucocorticoid injection administered bilateral knees. Patient had immediate relief.  Will check bilateral knee fills to evaluate DJD.  Follow-up as needed.

## 2011-07-02 NOTE — Progress Notes (Signed)
  Subjective:    Patient ID: Joel Donaldson, male    DOB: 09-15-1940, 71 y.o.   MRN: [id number]  HPI Acute visit: bilateral knee pain Characteristic: persistent medial knee pain bilaterally  Duration: months Interventions tried: joint injections in the past have helped Medications: 3-4 tablets Alleve daily not providing significant relief Exacerbated by: walking, standing, weight-bearing activities Alleviated by: sitting, laying  Review of Systems Denies fevers, chills, rash   Past Medical History, Family History, and Social History reviewed.  History of polyarthritis. Denies history of gout.     Objective:   Physical Exam GEN: WDWN, NAD, Non-toxic, Alert & Oriented x 3 HEENT: Atraumatic, Normocephalic.  EXTR: No clubbing/cyanosis/edema PSYCH: Normally interactive. Conversant. Not depressed or anxious appearing. Calm demeanor.   Knee:  RIGHT AND LEFT Gait: Normal heel toe pattern; slow ROM: 110 deg bilaterally; limited by pain and stiffness Effusion: moderate swelling Echymosis or edema: none Patellar tendon NT Medial and lateral joint lines: TTP medially  McMurray's: positive medially  Hip flexion str: 5/5   Knee Injection, RIGHT and LEFT knees Patient verbally consented to procedure. Risks (including potential rare risk of infection), benefits, and alternatives explained. Sterilely prepped with Povidone-Iodone swabsticks and then alcohol swabs. Ethyl chloride used for anesthesia. 4 cc Lidocaine 1% mixed with 4 cc Kenalog 40 mg injected using the anterolateral approach without difficulty. No complications with procedure and tolerated well. Patient had decreased pain post-injection.    Assessment & Plan:

## 2011-07-02 NOTE — Patient Instructions (Signed)
Follow-up when you can for your shoulder pain (1-2 weeks).  Follow-up in 1 month to talk about your breathing.

## 2011-07-03 ENCOUNTER — Telehealth: Payer: Self-pay | Admitting: *Deleted

## 2011-07-03 NOTE — Telephone Encounter (Signed)
Left message on voicemail informing patient that x-rays had been ordered and that he may go to [facility] or [facility] to have these done.

## 2011-07-03 NOTE — Telephone Encounter (Signed)
Message copied by Aram Beecham on Tue Jul 03, 2011 11:55 AM ------      Message from: [facility], ANGELA J      Created: Mon Jul 02, 2011  5:04 PM       Will you please call and notify patient that x-rays of his knees have been ordered and that he may go to [facility] at his convenience to get these taken?       Thank you.

## 2011-07-23 ENCOUNTER — Encounter: Payer: Self-pay | Admitting: Family Medicine

## 2011-07-23 ENCOUNTER — Ambulatory Visit: Payer: PRIVATE HEALTH INSURANCE | Admitting: Family Medicine

## 2011-07-23 DIAGNOSIS — C61 Malignant neoplasm of prostate: Secondary | ICD-10-CM

## 2011-07-26 ENCOUNTER — Telehealth: Payer: Self-pay | Admitting: *Deleted

## 2011-07-26 ENCOUNTER — Ambulatory Visit
Admission: RE | Admit: 2011-07-26 | Discharge: 2011-07-26 | Disposition: A | Discharge status: Home / Self Care | Payer: PRIVATE HEALTH INSURANCE | Source: Ambulatory Visit | Attending: Radiation Oncology | Admitting: Radiation Oncology

## 2011-07-26 ENCOUNTER — Encounter: Payer: Self-pay | Admitting: Radiation Oncology

## 2011-07-26 VITALS — BP 125/77 | HR 111 | Temp 99.7°F | Wt 182.8 lb

## 2011-07-26 DIAGNOSIS — Z923 Personal history of irradiation: Secondary | ICD-10-CM

## 2011-07-26 DIAGNOSIS — C61 Malignant neoplasm of prostate: Secondary | ICD-10-CM | POA: Insufficient documentation

## 2011-07-26 DIAGNOSIS — Z79899 Other long term (current) drug therapy: Secondary | ICD-10-CM | POA: Insufficient documentation

## 2011-07-26 DIAGNOSIS — I10 Essential (primary) hypertension: Secondary | ICD-10-CM | POA: Insufficient documentation

## 2011-07-26 DIAGNOSIS — K219 Gastro-esophageal reflux disease without esophagitis: Secondary | ICD-10-CM | POA: Insufficient documentation

## 2011-07-26 HISTORY — DX: Personal history of irradiation: Z92.3

## 2011-07-26 NOTE — Progress Notes (Signed)
Radiation Oncology         [phone] ________________________________  Initial outpatient Consultation  Name: Joel Donaldson MRN: [id number]  Date: 07/26/2011  DOB: 11-25-1940  CC:OH PARK, Marylene Land, MD  Lindaann Slough, MD   REFERRING PHYSICIAN: Lindaann Slough, MD  DIAGNOSIS: 71 year old gentleman with stage T1c adenocarcinoma of the prostate with a Gleason's Score 3+4 and PSA of 4.79  HISTORY OF PRESENT ILLNESS::Joel Donaldson is a 71 y.o. gentleman who was noted to have an elevated PSA of 4.5 one dating back to September 2010. He underwent transrectal ultrasound with prostate biopsy initially on 02/27/2010. Prostate volume at that time was 28.15 cc. Resting cancer was identified in 1 of 12 core specimens with a Gleason's score of 3+3 occupying 10% of the left lateral apex. After reviewing the potential treatment options, the patient elected to pursue a course of watchful waiting period his PSA remains essentially stable most recently at 4.79 on April 26. He underwent repeat surveillance prostate biopsy on May 22. In this setting, one out of 12 core biopsies was again positive but with a higher Gleason score of 3+4 occupying 30% of the same left lateral apex region.  The patient reviewed the biopsy results with his urologist and he has kindly been referred today for discussion of potential radiation treatment options.    PREVIOUS RADIATION THERAPY: No  PAST MEDICAL HISTORY:  has a past medical history of Cancer; Rapid heart beat; Arthritis; Anxiety; Depression; GERD (gastroesophageal reflux disease); Glaucoma; Hypertension; and Heart murmur.    PAST SURGICAL HISTORY: Past Surgical History  Procedure Date  . Back surgery     removed 2 vertabrea, fused 2 vertabrea together  . Shoulder surgery     left    FAMILY HISTORY: family history includes Diabetes in his mother.  SOCIAL HISTORY:  reports that he quit smoking about 6 years ago. He has never used smokeless tobacco. He  reports that he drinks about 5 ounces of alcohol per week. He reports that he does not use illicit drugs.  ALLERGIES: Tramadol  MEDICATIONS:  Current Outpatient Prescriptions  Medication Sig Dispense Refill  . albuterol (PROVENTIL HFA;VENTOLIN HFA) 108 (90 BASE) MCG/ACT inhaler Inhale 2 puffs into the lungs every 6 (six) hours as needed for wheezing.  1 Inhaler  1  . aspirin 81 MG EC tablet Take 81 mg by mouth daily.        Marland Kitchen lisinopril-hydrochlorothiazide (PRINZIDE,ZESTORETIC) 20-12.5 MG per tablet Take 1 tablet by mouth daily.  30 tablet  2  . naproxen (NAPROSYN) 250 MG tablet Take 1 tablet (250 mg total) by mouth 2 (two) times daily with a meal.  15 tablet  0  . omeprazole (PRILOSEC) 40 MG capsule Take 1 capsule (40 mg total) by mouth daily.  60 capsule  3  . albuterol (PROVENTIL,VENTOLIN) 90 MCG/ACT inhaler Inhale 1-2 puffs into the lungs every 4 (four) hours as needed. May use 1 or 2 puffs  17 g  6  . Elastic Bandages & Supports (RIB BELT/MENS LARGE) MISC 1 Device by Does not apply route once.  1 each  0  . ipratropium (ATROVENT HFA) 17 MCG/ACT inhaler Inhale 2 puffs into the lungs 4 (four) times daily.          REVIEW OF SYSTEMS:  A 15 point review of systems is documented in the electronic medical record. This was obtained by the nursing staff. However, I reviewed this with the patient to discuss relevant findings and make appropriate changes.  A  comprehensive review of systems was negative. his urinary outflow tract questionnaire revealed overall IPS score of 3 suggesting mild symptoms. His erectile function questionnaire revealed no or rectal dysfunction whatsoever.   PHYSICAL EXAM:  weight is 182 lb 12.8 oz (82.918 kg). His temperature is 99.7 F (37.6 C). His blood pressure is 125/77 and his pulse is 111.   He is in no acute distress today. Is alert and oriented. Head is normocephalic atraumatic. Hair is present normal pattern gestation. Speech is fluent articulate. Respiratory effort  is unremarkable. Extremities are free of cyanosis clubbing edema. Speech fluent radicular. Gait is unremarkable. Digital rectal exam described by Dr. Brunilda Payor on April 26 revealed no nodules in the prostate  LABORATORY DATA:  Lab Results  Component Value Date   WBC 3.1* 10/25/2010   HGB 13.8 10/25/2010   HCT 40.5 10/25/2010   MCV 82.5 10/25/2010   PLT 235 10/25/2010   Lab Results  Component Value Date   NA 139 10/25/2010   K 3.8 10/25/2010   CL 104 10/25/2010   CO2 27 10/25/2010   Lab Results  Component Value Date   ALT 19 10/25/2010   AST 19 10/25/2010   ALKPHOS 62 10/25/2010   BILITOT 0.4 10/25/2010     RADIOGRAPHY: No results found.    IMPRESSION: This patient is a very nice 71 year old gentleman gentleman with stage TI C. adenocarcinoma prostate Gleason score of 3+4 and PSA of 4.793 falls into an intermediate risk group. However, he falls into a subset of patients with small volume involvement he may be eligible for prostate seed implant as monotherapy. The patient also potentially be technically eligible for radical prostatectomy and external beam radiation treatment.  PLAN:Today I reviewed the findings and workup thus far.  We discussed the natural history of prostate cancer.  We reviewed the the implications of T-stage, Gleason's Score, and PSA on decision-making and outcomes in prostate cancer.  We discussed radiation treatment in the management of prostate cancer with regard to the logistics and delivery of external beam radiation treatment as well as the logistics and delivery of prostate brachytherapy.  We compared and contrasted each of these approaches and also compared these against prostatectomy.  The patient expressed interest in prostate brachytherapy.  I filled out a patient counseling form for him with relevant treatment diagrams and we retained a copy for our records.   The patient would like to proceed with prostate brachytherapy.  I will share my findings with Dr. Brunilda Payor and move forward  with scheduling the procedure in the near future.     I enjoyed meeting with him today, and will look forward to participating in the care of this very nice gentleman.  I spent 60 minutes minutes face to face with the patient and more than 50% of that time was spent in counseling and/or coordination of care.   ------------------------------------------------  Artist Pais. Kathrynn Running, M.D.

## 2011-07-26 NOTE — Progress Notes (Signed)
Please see the Nurse Progress Note in the MD Initial Consult Encounter for this patient. 

## 2011-07-26 NOTE — Telephone Encounter (Signed)
CALLED PATIENT TO INFORM OF APPT. FOR 07-27-11 FOR 9:15 AM, SPOKE WITH PATIENT AND HE IS AWARE OF THIS APPT.

## 2011-07-26 NOTE — Progress Notes (Signed)
Patient here with neighbor Malachi Bonds for radiation consultation for prostate cancer.Gleason 3+4=7.Seeking more information to make decision on best mode of treatment.Patient states he wants whatever will keep cancer from spreading and least side effects.

## 2011-07-27 ENCOUNTER — Ambulatory Visit
Admission: RE | Admit: 2011-07-27 | Discharge: 2011-07-27 | Disposition: A | Discharge status: Home / Self Care | Payer: PRIVATE HEALTH INSURANCE | Source: Ambulatory Visit | Attending: Radiation Oncology | Admitting: Radiation Oncology

## 2011-07-27 ENCOUNTER — Other Ambulatory Visit: Payer: Self-pay | Admitting: Urology

## 2011-07-27 ENCOUNTER — Ambulatory Visit (HOSPITAL_COMMUNITY)
Admission: RE | Admit: 2011-07-27 | Discharge: 2011-07-27 | Disposition: A | Discharge status: Home / Self Care | Payer: PRIVATE HEALTH INSURANCE | Source: Ambulatory Visit | Attending: Urology | Admitting: Urology

## 2011-07-27 ENCOUNTER — Encounter: Payer: Self-pay | Admitting: Radiation Oncology

## 2011-07-27 DIAGNOSIS — C61 Malignant neoplasm of prostate: Secondary | ICD-10-CM

## 2011-07-27 DIAGNOSIS — Z01818 Encounter for other preprocedural examination: Secondary | ICD-10-CM | POA: Insufficient documentation

## 2011-07-27 NOTE — Progress Notes (Signed)
  Radiation Oncology         [phone] ________________________________  Name: Joel Donaldson MRN: [id number]  Date: 07/27/2011  DOB: [birth year]  SIMULATION AND TREATMENT PLANNING NOTE PUBIC ARCH STUDY  CC:OH PARK, Marylene Land, MD  Lindaann Slough, MD  DIAGNOSIS: 71 year old gentleman with stage T1c adenocarcinoma of the prostate with a Gleason's Score 3+4 and PSA of 4.79  COMPLEX SIMULATION:  The patient presented today for evaluation for possible prostate seed implant. He was brought to the radiation planning suite and placed supine on the CT couch. A 3-dimensional image study set was obtained in upload to the planning computer. There, on each axial slice, I contoured the prostate gland. Then, using three-dimensional radiation planning tools I reconstructed the prostate in view of the structures from the transperineal needle pathway to assess for possible pubic arch interference. In doing so, I did not appreciate any pubic arch interference. Also, the patient's prostate volume was estimated based on the drawn structure. The volume was 23.5 cc.  Given the pubic arch appearance and prostate volume, patient remains a good candidate to proceed with prostate seed implant. Today, he freely provided informed written consent to proceed.   PLAN: The patient will undergo prostate seed implant.   ________________________________  Artist Pais. Kathrynn Running, M.D.

## 2011-07-31 NOTE — Addendum Note (Signed)
Encounter addended by: Tessa Lerner, RN on: 07/31/2011  4:02 PM<BR>     Documentation filed: Charges VN

## 2011-09-06 ENCOUNTER — Telehealth: Payer: Self-pay | Admitting: *Deleted

## 2011-09-06 NOTE — Telephone Encounter (Signed)
CALLED PATIENT TO REMIND OF APPT. FOR 09-07-11, SPOKE WITH PATIENT AND HE IS AWARE OF THIS APPT.

## 2011-09-07 LAB — COMPREHENSIVE METABOLIC PANEL
ALT: 15 U/L (ref 0–53)
AST: 20 U/L (ref 0–37)
Alkaline Phosphatase: 63 U/L (ref 39–117)
CO2: 27 mEq/L (ref 19–32)
GFR calc Af Amer: >90 mL/min (ref >90)
GFR calc non Af Amer: 84 mL/min — ABNORMAL LOW (ref >90)
Glucose, Bld: 104 mg/dL — ABNORMAL HIGH (ref 70–99)
Potassium: 3.8 mEq/L (ref 3.5–5.1)
Sodium: 138 mEq/L (ref 135–145)
Total Protein: 7.1 g/dL (ref 6.0–8.3)

## 2011-09-07 LAB — CBC
Hemoglobin: 13.6 g/dL (ref 13.0–17.0)
Platelets: 244 10*3/uL (ref 150–400)
RBC: 4.72 MIL/uL (ref 4.22–5.81)

## 2011-09-10 ENCOUNTER — Encounter (HOSPITAL_BASED_OUTPATIENT_CLINIC_OR_DEPARTMENT_OTHER): Payer: Self-pay | Admitting: *Deleted

## 2011-09-10 NOTE — Progress Notes (Signed)
NPO AFTER MN. ARRIVES AT 0615. CURRENT LAB RESULTS, EKG AND CXR IN EPIC AND CHART. WILL TAKE PRILOSEC AM OF SURG W/ SIP OF WATER AND DO FLEET ENEMA. WILL BRING INHALER.

## 2011-09-13 ENCOUNTER — Telehealth: Payer: Self-pay | Admitting: *Deleted

## 2011-09-13 NOTE — H&P (Signed)
History and Physical  Chief Complaint: Adenocarcinoma of prostate.  History of Present Illness: Joel Donaldson was diagnosed with prostate cancer Gleason 6 in January 2012 in 10% of one core at the left apex. PSA at diagnosis was 4.51. He elected to be on active surveillance.  PSA was 4.79 in May.  Repeat biopsy in May showed progression of the disease, Gleason 3+4 in 30% of one core at the left apex .  Treatment options were again discussed with him.  He was seen in consultation by Dr Kathrynn Running and  elected to have brachytherapy.  He is scheduled today for the procedure. Past Medical History  Diagnosis Date  . Arthritis     hands, back, knees  . Anxiety   . Depression   . GERD (gastroesophageal reflux disease)   . Glaucoma   . Hypertension   . Prostate cancer   . Heart murmur asymptomatic  . Short of breath on exertion   . Asthma   . Nocturia    Past Surgical History  Procedure Date  . Shoulder surgery YRS AGO    LEFT  . Lumbar fusion 1995    Medications: Lisinopril/HCTZ, Omeprazole, Naproxen, Albuterol, Aspirin. Allergies:  Allergies  Allergen Reactions  . Tramadol Nausea Only    Family History  Problem Relation Age of Onset  . Diabetes Mother    Social History:  reports that he quit smoking about 6 years ago. His smoking use included Cigarettes. He has a 100 pack-year smoking history. He has never used smokeless tobacco. He reports that he drinks about 8.4 ounces of alcohol per week. He reports that he does not use illicit drugs.  ROS: All systems are reviewed and negative except as noted.   Physical Exam:  Vital signs in last 24 hours:    Cardiovascular: Skin warm; not flushed Respiratory: Breaths quiet; no shortness of breath Abdomen: No masses Neurological: Normal sensation to touch Musculoskeletal: Normal motor function arms and legs Lymphatics: No inguinal adenopathy Skin: No rashes Genitourinary:Penis and scrotal contents are within normal limits. Rectal:  Prostate is enlarged, no nodules, seminal vesicles are not palpable.  Laboratory Data:  No results found for this or any previous visit (from the past 24 hour(s)). No results found for this or any previous visit (from the past 240 hour(s)). Creatinine:  Basename 09/07/11 0853  CREATININE 0.87    Impression/Assessment:  Adenocarcinoma of prostate Stage T1c  Plan:  I 125 seeds implantation  Joel Donaldson 09/13/2011, 9:42 PM

## 2011-09-13 NOTE — Telephone Encounter (Signed)
Called patient  To remind of procedure for 09-14-11, spoke with patient and he is aware of this procedure.

## 2011-09-14 ENCOUNTER — Other Ambulatory Visit: Payer: Self-pay

## 2011-09-14 ENCOUNTER — Encounter (HOSPITAL_BASED_OUTPATIENT_CLINIC_OR_DEPARTMENT_OTHER): Payer: Self-pay | Admitting: *Deleted

## 2011-09-14 ENCOUNTER — Ambulatory Visit (HOSPITAL_BASED_OUTPATIENT_CLINIC_OR_DEPARTMENT_OTHER): Payer: PRIVATE HEALTH INSURANCE | Admitting: Anesthesiology

## 2011-09-14 ENCOUNTER — Ambulatory Visit (HOSPITAL_BASED_OUTPATIENT_CLINIC_OR_DEPARTMENT_OTHER)
Admission: RE | Admit: 2011-09-14 | Discharge: 2011-09-14 | Disposition: A | Discharge status: Home / Self Care | Payer: PRIVATE HEALTH INSURANCE | Source: Ambulatory Visit | Attending: Urology | Admitting: Urology

## 2011-09-14 ENCOUNTER — Ambulatory Visit (HOSPITAL_COMMUNITY): Payer: PRIVATE HEALTH INSURANCE

## 2011-09-14 ENCOUNTER — Encounter (HOSPITAL_BASED_OUTPATIENT_CLINIC_OR_DEPARTMENT_OTHER): Payer: Self-pay | Admitting: Anesthesiology

## 2011-09-14 ENCOUNTER — Encounter (HOSPITAL_BASED_OUTPATIENT_CLINIC_OR_DEPARTMENT_OTHER)
Admission: RE | Disposition: A | Discharge status: Home / Self Care | Payer: Self-pay | Source: Ambulatory Visit | Attending: Urology

## 2011-09-14 DIAGNOSIS — Z01812 Encounter for preprocedural laboratory examination: Secondary | ICD-10-CM | POA: Insufficient documentation

## 2011-09-14 DIAGNOSIS — I10 Essential (primary) hypertension: Secondary | ICD-10-CM | POA: Insufficient documentation

## 2011-09-14 DIAGNOSIS — Z79899 Other long term (current) drug therapy: Secondary | ICD-10-CM | POA: Insufficient documentation

## 2011-09-14 DIAGNOSIS — C61 Malignant neoplasm of prostate: Secondary | ICD-10-CM | POA: Insufficient documentation

## 2011-09-14 DIAGNOSIS — K219 Gastro-esophageal reflux disease without esophagitis: Secondary | ICD-10-CM | POA: Insufficient documentation

## 2011-09-14 DIAGNOSIS — J45909 Unspecified asthma, uncomplicated: Secondary | ICD-10-CM | POA: Insufficient documentation

## 2011-09-14 HISTORY — PX: RADIOACTIVE SEED IMPLANT: SHX5150

## 2011-09-14 HISTORY — DX: Malignant neoplasm of prostate: C61

## 2011-09-14 HISTORY — DX: Unspecified asthma, uncomplicated: J45.909

## 2011-09-14 HISTORY — PX: CYSTOSCOPY: SHX5120

## 2011-09-14 SURGERY — INSERTION, RADIATION SOURCE, PROSTATE
Anesthesia: General | Site: Prostate | Wound class: Clean Contaminated

## 2011-09-14 MED ORDER — IOHEXOL 350 MG/ML SOLN
INTRAVENOUS | Status: DC | PRN
  Administered 2011-09-14: 7 mL

## 2011-09-14 MED ORDER — PROMETHAZINE HCL 25 MG/ML IJ SOLN: 6.2500 mg | INTRAMUSCULAR | Status: DC | PRN

## 2011-09-14 MED ORDER — MIDAZOLAM HCL 5 MG/5ML IJ SOLN
INTRAMUSCULAR | Status: DC | PRN
  Administered 2011-09-14: 1 mg via INTRAVENOUS

## 2011-09-14 MED ORDER — STERILE WATER FOR IRRIGATION IR SOLN
Status: DC | PRN
  Administered 2011-09-14: 3000 mL

## 2011-09-14 MED ORDER — LACTATED RINGERS IV SOLN
INTRAVENOUS | Status: DC
  Administered 2011-09-14: 10:00:00 via INTRAVENOUS

## 2011-09-14 MED ORDER — ONDANSETRON HCL 4 MG/2ML IJ SOLN
INTRAMUSCULAR | Status: DC | PRN
  Administered 2011-09-14: 4 mg via INTRAVENOUS

## 2011-09-14 MED ORDER — CIPROFLOXACIN HCL 500 MG PO TABS: 500.0000 mg | ORAL_TABLET | Freq: Two times a day (BID) | ORAL | Status: AC

## 2011-09-14 MED ORDER — LACTATED RINGERS IV SOLN
INTRAVENOUS | Status: DC
  Administered 2011-09-14 (×2): via INTRAVENOUS

## 2011-09-14 MED ORDER — STERILE WATER FOR IRRIGATION IR SOLN
Status: DC | PRN
  Administered 2011-09-14: 1000 mL

## 2011-09-14 MED ORDER — FENTANYL CITRATE 0.05 MG/ML IJ SOLN
INTRAMUSCULAR | Status: DC | PRN
  Administered 2011-09-14: 25 ug via INTRAVENOUS
  Administered 2011-09-14: 50 ug via INTRAVENOUS
  Administered 2011-09-14: 25 ug via INTRAVENOUS
  Administered 2011-09-14: 50 ug via INTRAVENOUS
  Administered 2011-09-14 (×2): 25 ug via INTRAVENOUS

## 2011-09-14 MED ORDER — FENTANYL CITRATE 0.05 MG/ML IJ SOLN: 25.0000 ug | INTRAMUSCULAR | Status: DC | PRN

## 2011-09-14 MED ORDER — EPHEDRINE SULFATE 50 MG/ML IJ SOLN
INTRAMUSCULAR | Status: DC | PRN
  Administered 2011-09-14 (×3): 10 mg via INTRAVENOUS

## 2011-09-14 MED ORDER — PROPOFOL 10 MG/ML IV EMUL
INTRAVENOUS | Status: DC | PRN
  Administered 2011-09-14: 200 mg via INTRAVENOUS

## 2011-09-14 MED ORDER — DEXAMETHASONE SODIUM PHOSPHATE 4 MG/ML IJ SOLN
INTRAMUSCULAR | Status: DC | PRN
  Administered 2011-09-14 (×2): 5 mg via INTRAVENOUS

## 2011-09-14 MED ORDER — FLEET ENEMA 7-19 GM/118ML RE ENEM: 1.0000 | ENEMA | Freq: Once | RECTAL | Status: DC

## 2011-09-14 MED ORDER — HYDROCODONE-ACETAMINOPHEN 5-500 MG PO CAPS: 1.0000 | ORAL_CAPSULE | Freq: Four times a day (QID) | ORAL | Status: AC | PRN

## 2011-09-14 MED ORDER — CIPROFLOXACIN IN D5W 400 MG/200ML IV SOLN
400.0000 mg | INTRAVENOUS | Status: AC
  Administered 2011-09-14: 400 mg via INTRAVENOUS

## 2011-09-14 MED ORDER — MEPERIDINE HCL 25 MG/ML IJ SOLN: 6.2500 mg | INTRAMUSCULAR | Status: DC | PRN

## 2011-09-14 MED ORDER — LIDOCAINE HCL (CARDIAC) 20 MG/ML IV SOLN
INTRAVENOUS | Status: DC | PRN
  Administered 2011-09-14 (×2): 50 mg via INTRAVENOUS

## 2011-09-14 MED ORDER — HYDROCODONE-ACETAMINOPHEN 10-325 MG PO TABS
1.0000 | ORAL_TABLET | ORAL | Status: DC | PRN
  Administered 2011-09-14: 1 via ORAL

## 2011-09-14 SURGICAL SUPPLY — 23 items
BAG URINE DRAINAGE (UROLOGICAL SUPPLIES) ×3 IMPLANT
BLADE SURG ROTATE 9660 (MISCELLANEOUS) ×4 IMPLANT
CATH FOLEY 2WAY SLVR  5CC 16FR (CATHETERS) ×2
CATH FOLEY 2WAY SLVR 5CC 16FR (CATHETERS) ×4 IMPLANT
CATH ROBINSON RED A/P 20FR (CATHETERS) ×3 IMPLANT
CLOTH BEACON ORANGE TIMEOUT ST (SAFETY) ×3 IMPLANT
COVER MAYO STAND STRL (DRAPES) ×3 IMPLANT
COVER TABLE BACK 60X90 (DRAPES) ×3 IMPLANT
DRSG TEGADERM 4X4.75 (GAUZE/BANDAGES/DRESSINGS) ×3 IMPLANT
DRSG TEGADERM 8X12 (GAUZE/BANDAGES/DRESSINGS) ×3 IMPLANT
GAUZE SPONGE 4X4 12PLY STRL LF (GAUZE/BANDAGES/DRESSINGS) ×1 IMPLANT
GLOVE BIO SURGEON STRL SZ7 (GLOVE) ×6 IMPLANT
GLOVE ECLIPSE 7.0 STRL STRAW (GLOVE) ×1 IMPLANT
GLOVE ECLIPSE 8.0 STRL XLNG CF (GLOVE) ×4 IMPLANT
GLOVE INDICATOR 7.0 STRL GRN (GLOVE) ×1 IMPLANT
HOLDER FOLEY CATH W/STRAP (MISCELLANEOUS) ×3 IMPLANT
IV WATER IRRIG STERILE 3000ML (IV SOLUTION) ×3 IMPLANT
PACK CYSTOSCOPY (CUSTOM PROCEDURE TRAY) ×3 IMPLANT
PAD PREP 24X48 CUFFED NSTRL (MISCELLANEOUS) ×1 IMPLANT
SYRINGE 10CC LL (SYRINGE) ×3 IMPLANT
Seeds (Urological Implant) ×1 IMPLANT
UNDERPAD 30X30 INCONTINENT (UNDERPADS AND DIAPERS) ×6 IMPLANT
WATER STERILE IRR 500ML POUR (IV SOLUTION) ×3 IMPLANT

## 2011-09-14 NOTE — Anesthesia Preprocedure Evaluation (Addendum)
Anesthesia Evaluation  Patient identified by MRN, date of birth, ID band Patient awake    Reviewed: Allergy & Precautions, H&P , NPO status , Patient's Chart, lab work & pertinent test results  Airway Mallampati: III TM Distance: >3 FB Neck ROM: Full    Dental No notable dental hx.    Pulmonary shortness of breath, asthma , COPD COPD inhaler,  breath sounds clear to auscultation  Pulmonary exam normal       Cardiovascular hypertension, Pt. on medications Valvular problems/murmurs: asymtomatic. Rhythm:Regular Rate:Normal     Neuro/Psych PSYCHIATRIC DISORDERS Anxiety Depression negative neurological ROS     GI/Hepatic negative GI ROS, Neg liver ROS, GERD-  Medicated and Controlled,  Endo/Other  negative endocrine ROS  Renal/GU negative Renal ROS  negative genitourinary   Musculoskeletal negative musculoskeletal ROS (+)   Abdominal   Peds negative pediatric ROS (+)  Hematology negative hematology ROS (+)   Anesthesia Other Findings   Reproductive/Obstetrics negative OB ROS                          Anesthesia Physical Anesthesia Plan  ASA: III  Anesthesia Plan: General   Post-op Pain Management:    Induction: Intravenous  Airway Management Planned: LMA  Additional Equipment:   Intra-op Plan:   Post-operative Plan: Extubation in OR  Informed Consent: I have reviewed the patients History and Physical, chart, labs and discussed the procedure including the risks, benefits and alternatives for the proposed anesthesia with the patient or authorized representative who has indicated his/her understanding and acceptance.   Dental advisory given  Plan Discussed with: CRNA  Anesthesia Plan Comments:        Anesthesia Quick Evaluation

## 2011-09-14 NOTE — Anesthesia Postprocedure Evaluation (Signed)
  Anesthesia Post-op Note  Patient: Joel Donaldson  Procedure(s) Performed: Procedure(s) (LRB): RADIOACTIVE SEED IMPLANT (N/A) CYSTOSCOPY FLEXIBLE (N/A)  Patient Location: PACU  Anesthesia Type: General  Level of Consciousness: awake and alert   Airway and Oxygen Therapy: Patient Spontanous Breathing  Post-op Pain: mild  Post-op Assessment: Post-op Vital signs reviewed, Patient's Cardiovascular Status Stable, Respiratory Function Stable, Patent Airway and No signs of Nausea or vomiting  Post-op Vital Signs: stable  Complications: No apparent anesthesia complications

## 2011-09-14 NOTE — Transfer of Care (Signed)
Immediate Anesthesia Transfer of Care Note  Patient: Joel Donaldson  Procedure(s) Performed: Procedure(s) (LRB): RADIOACTIVE SEED IMPLANT (N/A) CYSTOSCOPY FLEXIBLE (N/A)  Patient Location: PACU  Anesthesia Type: General  Level of Consciousness:drowsy, arouses to name, follows commands  Airway & Oxygen Therapy: Patient Spontanous Breathing and Patient connected to face mask oxygen  Post-op Assessment: Report given to PACU RN and Post -op Vital signs reviewed and stable  Post vital signs: Reviewed and stable  Complications: No apparent anesthesia complications

## 2011-09-14 NOTE — Op Note (Signed)
Joel Donaldson is a 71 y.o.   09/14/2011  General  Pre-op diagnosis: Stage TI C adenocarcinoma of prostate  Postop diagnosis: Same  Procedure done: I-125 seeds implantation and cystoscopy  Surgeon: Wendie Simmer. Melesa Lecy and Dr. Margaretmary Dys  Anesthesia: General  Indication: Patient is a 71 years old male who was diagnosed in January 2012 with adenocarcinoma of prostate Gleason 6 in 10% of 1 core at the left apex. He chose to be on active surveillance. PSA in May of this year was 4.79. Repeat prostate biopsy showed a progression of the disease. He now has Gleason 3+4 in 30% of 1 core at the left apex. Treatment options were again discussed with him and he chose to have a seeds implantation. He is scheduled today for the procedure.  Procedure: The patient was identified by his wrist band and proper timeout was taken.  Under general anesthesia patient was prepped and draped and placed in the dorsolithotomy position. A #16 French Foley catheter was inserted in the bladder. Ultrasound planning was done by Dr. Kathrynn Running. And when planning was completed with the Nucletron and under ultrasound guidance a total of 70 seeds were implanted in the prostate through 22 needles. The total apparent activity is 23.1700 mCi. Under fluoroscopy there appears to be good seeds distribution throughout the prostate.  The Foley catheter was then removed. A flexible cystoscope was inserted in the bladder. The anterior urethra is normal. There is moderate prostatic hypertrophy. The bladder mucosa is normal. There is no stone, tumor nor seed in the bladder. The ureteral orifices are in normal position and shape. The cystoscope was then removed. A #16 French Foley catheter was then inserted in the bladder.  EBL: Minimal  The patient tolerated the procedure well and left the OR in satisfactory condition to postanesthesia care unit.

## 2011-09-14 NOTE — Anesthesia Procedure Notes (Signed)
Procedure Name: LMA Insertion Date/Time: 09/14/2011 7:53 AM Performed by: Norva Pavlov Pre-anesthesia Checklist: Patient identified, Emergency Drugs available, Suction available and Patient being monitored Patient Re-evaluated:Patient Re-evaluated prior to inductionOxygen Delivery Method: Circle System Utilized Preoxygenation: Pre-oxygenation with 100% oxygen Intubation Type: IV induction Ventilation: Mask ventilation without difficulty LMA: LMA inserted LMA Size: 4.0 Number of attempts: 1 Airway Equipment and Method: bite block Placement Confirmation: positive ETCO2 Tube secured with: Tape Dental Injury: Teeth and Oropharynx as per pre-operative assessment

## 2011-09-16 NOTE — Op Note (Signed)
  Radiation Oncology         [phone] ________________________________  Name: Joel Donaldson MRN: [id number]  Date: 07/26/2011  DOB: 07-Oct-1940       Prostate Seed Implant  CC:OH PARK, ANGELA, MD    DIAGNOSIS: 71 year old gentleman with stage T1c adenocarcinoma of the prostate with a Gleason's Score 3+4 and PSA of 4.79  PROCEDURE: Insertion of radioactive I-125 seeds into the prostate gland.  RADIATION DOSE: 145 Gy, definitive therapy.  TECHNIQUE: Joel Donaldson was brought to the operating room with the urologist. He was placed in the dorsolithotomy position. He was catheterized and a rectal tube was inserted. The perineum was shaved, prepped and draped. The ultrasound probe was then introduced into the rectum to see the prostate gland.  TREATMENT DEVICE: A needle grid was attached to the ultrasound probe stand and anchor needles were placed.  COMPLEX ISODOSE CALCULATION: The prostate was imaged in 3D using a sagittal sweep of the prostate probe. These images were transferred to the planning computer. There, the prostate, urethra and rectum were defined on each axial reconstructed image. Then, the software created an optimized plan and a few seed positions were adjusted. Then the accepted plan was uploaded to the seed Selectron afterloading unit.  SPECIAL TREATMENT PROCEDURE/SUPERVISION AND HANDLING: The Nucletron FIRST system was used to place the needles under sagittal guidance. A total of 22 needles were used to deposit 70 seeds in the prostate gland. The individual seed activity was 0.331 mCi for a total implant activity of 23.17 mCi.  COMPLEX SIMULATION: At the end of the procedure, an anterior radiograph of the pelvis was obtained to document seed positioning and count. Cystoscopy was performed to check the urethra and bladder.  MICRODOSIMETRY: At the end of the procedure, the patient was emitting 0.3 mrem/hr at 1 meter. Accordingly, he was considered safe for hospital  discharge.  PLAN: The patient will return to the radiation oncology clinic for post implant CT dosimetry in three weeks.   ________________________________  Artist Pais Kathrynn Running, M.D.

## 2011-09-17 ENCOUNTER — Encounter (HOSPITAL_BASED_OUTPATIENT_CLINIC_OR_DEPARTMENT_OTHER): Payer: Self-pay | Admitting: Urology

## 2011-10-01 ENCOUNTER — Encounter: Payer: Self-pay | Admitting: Family Medicine

## 2011-10-01 ENCOUNTER — Ambulatory Visit (INDEPENDENT_AMBULATORY_CARE_PROVIDER_SITE_OTHER): Payer: PRIVATE HEALTH INSURANCE | Admitting: Family Medicine

## 2011-10-01 VITALS — BP 154/96 | HR 88 | Temp 98.6°F | Ht 70.0 in | Wt 185.2 lb

## 2011-10-01 DIAGNOSIS — M25561 Pain in right knee: Secondary | ICD-10-CM

## 2011-10-01 DIAGNOSIS — M25569 Pain in unspecified knee: Secondary | ICD-10-CM

## 2011-10-01 MED ORDER — METHYLPREDNISOLONE ACETATE 40 MG/ML IJ SUSP: 40.0000 mg | Freq: Once | INTRAMUSCULAR | Status: DC

## 2011-10-01 NOTE — Assessment & Plan Note (Addendum)
Right knee acute on chronic pain with mild-moderate effusion, likely exacerbation of chronic osteoarthritis. No sign of infectious or inflammatory arthritis today. Approximately 5 cc of serosanguinous fluid aspirated and depo-medrol 40 mg injected today. Ordered plain films to rule out any bone pathology in setting of prostate cancer. Discussed avoidance of excess NSAIDs and to use tylenol prn. PT referral for strengthening. F/u with PCP in 2 weeks.     After consent was obtained, using sterile technique the right knee was prepped from the lateral infrapatellar approach. The knee joint was entered and 5 ml's of serosanguinous colored fluid was withdrawn.  Steroid depomedrol mg and 4 ml plain Lidocaine was then injected and the needle withdrawn.  The procedure was well tolerated.  The patient is asked to continue to rest the knee for a few more days before resuming regular activities.  It may be more painful for the first 1-2 days.  Watch for fever, or increased swelling or persistent pain in knee. Call or return to clinic prn if such symptoms occur or the knee fails to improve as anticipated.

## 2011-10-01 NOTE — Progress Notes (Signed)
  Subjective:    Patient ID: Joel Donaldson, male    DOB: 03/12/1940, 71 y.o.   MRN: [id number]  HPI  1. Right knee pain. Has history of knee pain chronically. Seen in May and underwent bilateral steroid injections with good improvement of symptoms at that time. He did not follow up for x-rays as planned. Patient has had right knee pain for the past 2 days associated with effusion. He has been taking Aleve 3 tablets daily with mild benefit. Having trouble walking due to decreased range of motion. Previously had physical therapy, though has been some time. States he had been tested for gout before and this was negative.   He denies any trauma, fever, redness, leg swelling, other joint involvement, rash, malaise, weight changes. He recently underwent a prostate treatment for cancer.  Review of Systems See HPI otherwise negative.  reports that he quit smoking about 6 years ago. His smoking use included Cigarettes. He has a 100 pack-year smoking history. He has never used smokeless tobacco.     Objective:   Physical Exam  Vitals reviewed. Constitutional: He is oriented to person, place, and time. He appears well-developed and well-nourished. No distress.  HENT:  Head: Normocephalic and atraumatic.  Eyes: EOM are normal.  Cardiovascular: Normal rate, regular rhythm and normal heart sounds.   No murmur heard. Pulmonary/Chest: Effort normal and breath sounds normal. He has no wheezes. He has no rales.  Musculoskeletal: He exhibits edema and tenderness.       Right knee mild effusion noted. No warmth or redness. Bilateral bone hypertrophy and bilateral joint line tenderness.  Prior to aspiration, right knee extension limited by -10 degrees compared to left, flexion limited to 45 degrees.   Gait antalgic.  Neurological: He is alert and oriented to person, place, and time.  Skin: No rash noted. He is not diaphoretic. No erythema.  Psychiatric: He has a normal mood and affect.         Assessment & Plan:

## 2011-10-01 NOTE — Patient Instructions (Signed)
Your knee pain is most likely from arthritis. Need to have xrays done in next 2-3 days. Make an appointment with Dr. Bluford Kaufmann park in 2 weeks. You can take tylenol safely for pain. Limit your aleve to two tablets daily to avoid side effects.  Knee Pain The knee is the complex joint between your thigh and your lower leg. It is made up of bones, tendons, ligaments, and cartilage. The bones that make up the knee are:  The femur in the thigh.   The tibia and fibula in the lower leg.   The patella or kneecap riding in the groove on the lower femur.  CAUSES  Knee pain is a common complaint with many causes. A few of these causes are:  Injury, such as:   A ruptured ligament or tendon injury.   Torn cartilage.   Medical conditions, such as:   Gout   Arthritis   Infections   Overuse, over training or overdoing a physical activity.  Knee pain can be minor or severe. Knee pain can accompany debilitating injury. Minor knee problems often respond well to self-care measures or get well on their own. More serious injuries may need medical intervention or even surgery. SYMPTOMS The knee is complex. Symptoms of knee problems can vary widely. Some of the problems are:  Pain with movement and weight bearing.   Swelling and tenderness.   Buckling of the knee.   Inability to straighten or extend your knee.   Your knee locks and you cannot straighten it.   Warmth and redness with pain and fever.   Deformity or dislocation of the kneecap.  DIAGNOSIS  Determining what is wrong may be very straight forward such as when there is an injury. It can also be challenging because of the complexity of the knee. Tests to make a diagnosis may include:  Your caregiver taking a history and doing a physical exam.   Routine X-rays can be used to rule out other problems. X-rays will not reveal a cartilage tear. Some injuries of the knee can be diagnosed by:   Arthroscopy a surgical technique by which a  small video camera is inserted through tiny incisions on the sides of the knee. This procedure is used to examine and repair internal knee joint problems. Tiny instruments can be used during arthroscopy to repair the torn knee cartilage (meniscus).   Arthrography is a radiology technique. A contrast liquid is directly injected into the knee joint. Internal structures of the knee joint then become visible on X-ray film.   An MRI scan is a non x-ray radiology procedure in which magnetic fields and a computer produce two- or three-dimensional images of the inside of the knee. Cartilage tears are often visible using an MRI scanner. MRI scans have largely replaced arthrography in diagnosing cartilage tears of the knee.   Blood work.   Examination of the fluid that helps to lubricate the knee joint (synovial fluid). This is done by taking a sample out using a needle and a syringe.  TREATMENT The treatment of knee problems depends on the cause. Some of these treatments are:  Depending on the injury, proper casting, splinting, surgery or physical therapy care will be needed.   Give yourself adequate recovery time. Do not overuse your joints. If you begin to get sore during workout routines, back off. Slow down or do fewer repetitions.   For repetitive activities such as cycling or running, maintain your strength and nutrition.   Alternate muscle groups. For  example if you are a weight lifter, work the upper body on one day and the lower body the next.   Either tight or weak muscles do not give the proper support for your knee. Tight or weak muscles do not absorb the stress placed on the knee joint. Keep the muscles surrounding the knee strong.   Take care of mechanical problems.   If you have flat feet, orthotics or special shoes may help. See your caregiver if you need help.   Arch supports, sometimes with wedges on the inner or outer aspect of the heel, can help. These can shift pressure away  from the side of the knee most bothered by osteoarthritis.   A brace called an "unloader" brace also may be used to help ease the pressure on the most arthritic side of the knee.   If your caregiver has prescribed crutches, braces, wraps or ice, use as directed. The acronym for this is PRICE. This means protection, rest, ice, compression and elevation.   Nonsteroidal anti-inflammatory drugs (NSAID's), can help relieve pain. But if taken immediately after an injury, they may actually increase swelling. Take NSAID's with food in your stomach. Stop them if you develop stomach problems. Do not take these if you have a history of ulcers, stomach pain or bleeding from the bowel. Do not take without your caregiver's approval if you have problems with fluid retention, heart failure, or kidney problems.   For ongoing knee problems, physical therapy may be helpful.   Glucosamine and chondroitin are over-the-counter dietary supplements. Both may help relieve the pain of osteoarthritis in the knee. These medicines are different from the usual anti-inflammatory drugs. Glucosamine may decrease the rate of cartilage destruction.   Injections of a corticosteroid drug into your knee joint may help reduce the symptoms of an arthritis flare-up. They may provide pain relief that lasts a few months. You may have to wait a few months between injections. The injections do have a small increased risk of infection, water retention and elevated blood sugar levels.   Hyaluronic acid injected into damaged joints may ease pain and provide lubrication. These injections may work by reducing inflammation. A series of shots may give relief for as long as 6 months.   Topical painkillers. Applying certain ointments to your skin may help relieve the pain and stiffness of osteoarthritis. Ask your pharmacist for suggestions. Many over the-counter products are approved for temporary relief of arthritis pain.   In some countries, doctors  often prescribe topical NSAID's for relief of chronic conditions such as arthritis and tendinitis. A review of treatment with NSAID creams found that they worked as well as oral medications but without the serious side effects.  PREVENTION  Maintain a healthy weight. Extra pounds put more strain on your joints.   Get strong, stay limber. Weak muscles are a common cause of knee injuries. Stretching is important. Include flexibility exercises in your workouts.   Be smart about exercise. If you have osteoarthritis, chronic knee pain or recurring injuries, you may need to change the way you exercise. This does not mean you have to stop being active. If your knees ache after jogging or playing basketball, consider switching to swimming, water aerobics or other low-impact activities, at least for a few days a week. Sometimes limiting high-impact activities will provide relief.   Make sure your shoes fit well. Choose footwear that is right for your sport.   Protect your knees. Use the proper gear for knee-sensitive activities. Use  kneepads when playing volleyball or laying carpet. Buckle your seat belt every time you drive. Most shattered kneecaps occur in car accidents.   Rest when you are tired.  SEEK MEDICAL CARE IF:  You have knee pain that is continual and does not seem to be getting better.  SEEK IMMEDIATE MEDICAL CARE IF:  Your knee joint feels hot to the touch and you have a high fever. MAKE SURE YOU:   Understand these instructions.   Will watch your condition.   Will get help right away if you are not doing well or get worse.  Document Released: 11/26/2006 Document Revised: 01/18/2011 Document Reviewed: 11/26/2006 [facility] Patient Information 2012 [place], [place].

## 2011-10-03 ENCOUNTER — Telehealth: Payer: Self-pay | Admitting: *Deleted

## 2011-10-03 ENCOUNTER — Encounter: Payer: Self-pay | Admitting: Radiation Oncology

## 2011-10-03 NOTE — Telephone Encounter (Signed)
Called patient to remind of appts. For 10-04-11, spoke with patient and he is aware of these appts.

## 2011-10-04 ENCOUNTER — Ambulatory Visit
Admission: RE | Admit: 2011-10-04 | Discharge: 2011-10-04 | Disposition: A | Discharge status: Home / Self Care | Payer: PRIVATE HEALTH INSURANCE | Source: Ambulatory Visit | Attending: Radiation Oncology | Admitting: Radiation Oncology

## 2011-10-04 ENCOUNTER — Encounter: Payer: Self-pay | Admitting: Radiation Oncology

## 2011-10-04 VITALS — BP 149/100 | HR 105 | Temp 99.7°F | Wt 183.2 lb

## 2011-10-04 DIAGNOSIS — C61 Malignant neoplasm of prostate: Secondary | ICD-10-CM

## 2011-10-04 NOTE — Addendum Note (Signed)
Encounter addended by: Tessa Lerner, RN on: 10/04/2011  2:12 PM<BR>     Documentation filed: Inpatient Document Flowsheet

## 2011-10-04 NOTE — Progress Notes (Signed)
Patient here post seed implant on 07/26/2011.Doing well except minor frequency and urgency.Occasional burning on urination.DEnies visible blood in urine.Bowels ok. IPSS 18.Scheduled for post op visit with Dr.Nesi on tomorrow.

## 2011-10-04 NOTE — Progress Notes (Signed)
  Radiation Oncology         [phone] ________________________________  Name: Joel Donaldson MRN: [id number]  Date: 10/04/2011  DOB: May 17, 1940  COMPLEX SIMULATION NOTE  NARRATIVE:  The patient was brought to the CT Simulation planning suite today following prostate seed implantation approximately one month ago.  Identity was confirmed.  All relevant records and images related to the planned course of therapy were reviewed.  Then, the patient was set-up supine.  CT images were obtained.  The CT images were loaded into the planning software.  Then the prostate and rectum were contoured.  Treatment planning then occurred.  The implanted iodine 125 seeds were identified by the physics staff for projection of radiation distribution  I have requested : 3D Simulation  I have requested a DVH of the following structures: Prostate and rectum.    ________________________________  Artist Pais Kathrynn Running, M.D.

## 2011-10-04 NOTE — Progress Notes (Signed)
  Radiation Oncology         [phone] ________________________________  Name: Joel Donaldson MRN: [id number]  Date: 10/04/2011  DOB: 03-Mar-1940  Follow-Up Visit Note  CC: OH PARK, Marylene Land, MD  Lindaann Slough, MD  Diagnosis:   71 year old gentleman with stage T1c adenocarcinoma of the prostate with a Gleason's Score 3+4 and PSA of 4.79  Interval Since Last Radiation:  3 weeks  Narrative:  The patient returns today for routine follow-up.  He is complaining of increased urinary frequency and urinary hesitation symptoms. He filled out a questionnaire regarding urinary function today providing and overall IPSS score of 19 characterizing his symptoms as moderate.  His pre-implant score was 3. He denies any bowel symptoms.  ALLERGIES:  is allergic to tramadol.  Meds: Current Outpatient Prescriptions  Medication Sig Dispense Refill  . albuterol (PROVENTIL,VENTOLIN) 90 MCG/ACT inhaler Inhale 1-2 puffs into the lungs every 4 (four) hours as needed. May use 1 or 2 puffs  17 g  6  . aspirin 81 MG EC tablet Take 81 mg by mouth daily.       Marland Kitchen lisinopril-hydrochlorothiazide (PRINZIDE,ZESTORETIC) 20-12.5 MG per tablet Take 1 tablet by mouth daily.  30 tablet  2  . loteprednol (ALREX) 0.2 % SUSP Place 1 drop into both eyes daily.      . naproxen (NAPROSYN) 250 MG tablet Take 250 mg by mouth daily.      Marland Kitchen omeprazole (PRILOSEC) 40 MG capsule Take 40 mg by mouth every morning.       Current Facility-Administered Medications  Medication Dose Route Frequency Provider Last Rate Last Dose  . methylPREDNISolone acetate (DEPO-MEDROL) injection 40 mg  40 mg Intramuscular Once Durwin Reges, MD        Physical Findings: The patient is in no acute distress. Patient is alert and oriented.  vitals were not taken for this visit..  No significant changes.  Lab Findings: Lab Results  Component Value Date   WBC 3.5* 09/07/2011   HGB 13.6 09/07/2011   HCT 40.7 09/07/2011   MCV 86.2 09/07/2011   PLT 244  09/07/2011    Radiographic Findings:  Patient underwent CT imaging in our clinic for post implant dosimetry. The CT appears to demonstrate an adequate distribution of radioactive seeds throughout the prostate gland. There no seeds in her near the rectum. I suspect the final radiation plan and dosimetry will show appropriate coverage of the prostate gland.   Impression: The patient is recovering from the effects of radiation. His urinary symptoms should gradually improve over the next 4-6 months. We talked about this today. He is encouraged by his improvement already and is otherwise please with his outcome.   Plan: Today, I spent time talking to the patient about his prostate seed implant and resolving urinary symptoms. Which for long-term followup for prostate cancer following seed implant. He understands that ongoing PSA determinations and digital rectal exams will help perform surveillance to rule out disease recurrence. He understands what to expect with his PSA measures. Patient was also educated today about some of the long-term effects would radiation including the Small risk for rectal bleeding and possibly erectile dysfunction. Talked about some of the general management approaches to these potential complications. However, I did encourage the patient to contact her office or return at any point if he has questions or concerns related to his previous radiation and prostate cancer.   _____________________________________  Artist Pais. Kathrynn Running, M.D.

## 2011-10-08 ENCOUNTER — Ambulatory Visit: Payer: PRIVATE HEALTH INSURANCE | Attending: Family Medicine

## 2011-10-08 DIAGNOSIS — IMO0001 Reserved for inherently not codable concepts without codable children: Secondary | ICD-10-CM | POA: Insufficient documentation

## 2011-10-08 DIAGNOSIS — R262 Difficulty in walking, not elsewhere classified: Secondary | ICD-10-CM | POA: Insufficient documentation

## 2011-10-08 DIAGNOSIS — R5381 Other malaise: Secondary | ICD-10-CM | POA: Insufficient documentation

## 2011-10-08 DIAGNOSIS — M25569 Pain in unspecified knee: Secondary | ICD-10-CM | POA: Insufficient documentation

## 2011-10-09 ENCOUNTER — Ambulatory Visit
Admission: RE | Admit: 2011-10-09 | Discharge: 2011-10-09 | Disposition: A | Discharge status: Home / Self Care | Payer: PRIVATE HEALTH INSURANCE | Source: Ambulatory Visit | Attending: Family Medicine | Admitting: Family Medicine

## 2011-10-09 DIAGNOSIS — M25561 Pain in right knee: Secondary | ICD-10-CM

## 2011-10-10 ENCOUNTER — Ambulatory Visit: Payer: PRIVATE HEALTH INSURANCE | Admitting: Physical Therapy

## 2011-10-17 ENCOUNTER — Ambulatory Visit: Payer: PRIVATE HEALTH INSURANCE | Attending: Family Medicine

## 2011-10-17 DIAGNOSIS — R5381 Other malaise: Secondary | ICD-10-CM | POA: Insufficient documentation

## 2011-10-17 DIAGNOSIS — M25569 Pain in unspecified knee: Secondary | ICD-10-CM | POA: Insufficient documentation

## 2011-10-17 DIAGNOSIS — R262 Difficulty in walking, not elsewhere classified: Secondary | ICD-10-CM | POA: Insufficient documentation

## 2011-10-17 DIAGNOSIS — IMO0001 Reserved for inherently not codable concepts without codable children: Secondary | ICD-10-CM | POA: Insufficient documentation

## 2011-10-22 ENCOUNTER — Ambulatory Visit: Payer: PRIVATE HEALTH INSURANCE

## 2011-10-24 ENCOUNTER — Ambulatory Visit: Payer: PRIVATE HEALTH INSURANCE

## 2011-10-29 ENCOUNTER — Encounter: Payer: PRIVATE HEALTH INSURANCE | Admitting: Physical Therapy

## 2011-11-01 ENCOUNTER — Ambulatory Visit: Payer: PRIVATE HEALTH INSURANCE

## 2011-11-06 ENCOUNTER — Ambulatory Visit
Admission: RE | Admit: 2011-11-06 | Discharge: 2011-11-06 | Disposition: A | Discharge status: Home / Self Care | Payer: PRIVATE HEALTH INSURANCE | Source: Ambulatory Visit | Attending: Radiation Oncology | Admitting: Radiation Oncology

## 2011-11-06 ENCOUNTER — Ambulatory Visit: Payer: PRIVATE HEALTH INSURANCE

## 2011-11-06 ENCOUNTER — Encounter: Payer: Self-pay | Admitting: Radiation Oncology

## 2011-11-06 DIAGNOSIS — C61 Malignant neoplasm of prostate: Secondary | ICD-10-CM | POA: Insufficient documentation

## 2011-11-08 ENCOUNTER — Ambulatory Visit: Payer: PRIVATE HEALTH INSURANCE

## 2011-11-18 NOTE — Progress Notes (Signed)
  Radiation Oncology         [phone] ________________________________  Name: MOROCCO GIPE MRN: [id number]  Date: 11/06/2011  DOB: 08/29/1940  3-D Planning Note Prostate Brachytherapy  Diagnosis: 71 year old gentleman with stage T1c adenocarcinoma of the prostate with a Gleason's Score 3+4 and PSA of 4.79  Narrative: KRISTINA BERTONE returned following prostate seed implantation for post implant planning. He underwent CT scan to delineate the three-dimensional structures of the pelvis and demonstrate the radiation distribution.  Results:   Prostate Coverage - The dose of radiation delivered to the 90% or more of the prostate gland (D90) was 113.53% of the prescription dose. This exceeds our goal of greater than 90%. Rectal Sparing - The volume of rectal tissue receiving the prescription dose or higher was 0.0 cc. This falls under our thresholds tolerance of 1.0 cc.  Impression: The prostate seed implant appears to show adequate target coverage and appropriate rectal sparing.  Plan:  The patient will continue to follow with urology for ongoing PSA determinations. I would anticipate a high likelihood for local tumor control with minimal risk for rectal morbidity.   Artist Pais Kathrynn Running, M.D.

## 2011-12-03 ENCOUNTER — Encounter: Payer: Self-pay | Admitting: Family Medicine

## 2011-12-03 ENCOUNTER — Ambulatory Visit (INDEPENDENT_AMBULATORY_CARE_PROVIDER_SITE_OTHER): Payer: PRIVATE HEALTH INSURANCE | Admitting: Family Medicine

## 2011-12-03 VITALS — BP 131/81 | HR 102 | Temp 98.4°F | Ht 70.0 in | Wt 180.0 lb

## 2011-12-03 DIAGNOSIS — Z0000 Encounter for general adult medical examination without abnormal findings: Secondary | ICD-10-CM | POA: Insufficient documentation

## 2011-12-03 DIAGNOSIS — M25561 Pain in right knee: Secondary | ICD-10-CM

## 2011-12-03 DIAGNOSIS — Z23 Encounter for immunization: Secondary | ICD-10-CM

## 2011-12-03 DIAGNOSIS — M25569 Pain in unspecified knee: Secondary | ICD-10-CM

## 2011-12-03 MED ORDER — HYDROCODONE-ACETAMINOPHEN 5-500 MG PO TABS: 1.0000 | ORAL_TABLET | Freq: Two times a day (BID) | ORAL | Status: DC | PRN

## 2011-12-03 NOTE — Progress Notes (Signed)
  Subjective:    Patient ID: Joel Donaldson, male    DOB: 12-31-1940, 71 y.o.   MRN: [id number]  HPI # Persistent chronic bilateral knee pain, severe osteoarthritis  He was seen 08/19 and had 5 mL of fluid drained and received steroid injection of right knee at that time.  He underwent PT for about 4 weeks, finished 1-2 weeks ago. He does not think this has helped. They did massages, creams, and exercises.  His knee pain improved somewhat after the injection but came back 1-2 weeks ago.   He says the pain is difficult to bear right now.   Alleve, ibuprofen, Tylenol provide no relief.   He uses a cane to walk most of the time.   Review of Systems Per HPI Denies fevers/chills/nausea Denies palpitations, chest pain   Allergies, medication, past medical history reviewed.  Significant for: -HLD -GERD -COPD -HTN -Prostate cancer--recently had seeds implanted. Will follow-up with them in about a month     Objective:   Physical Exam GEN: NAD PSYCH: appears mildly depressed GEN: WDWN, NAD, Non-toxic, Alert & Oriented x 3 HEENT: Atraumatic, Normocephalic.  Ears and Nose: No external deformity. EXTR: No clubbing/cyanosis/edema NEURO: Normal gait.  PSYCH: Normally interactive. Conversant. Not depressed or anxious appearing.  Calm demeanor.   Knee:  RIGHT and LEFT Gait: Normal heel toe pattern ROM: 45 deg flexion, 170 deg extension bilaterally Hip ROM: 30 deg internal and 45 deg external rotation bilaterally Echymosis, erythema, warmth or edema: none Patellar tendon NT medial and lateral joint lines: tender medial joint lines bilaterally Varus and valgus stress: stable Strength: 4-5/5 bilaterally     Assessment & Plan:

## 2011-12-03 NOTE — Patient Instructions (Addendum)
Try Vicodin 1 tablet every 12 hours as needed  Follow-up in 2-4 weeks or sooner if needed  Flu shot today

## 2011-12-03 NOTE — Assessment & Plan Note (Signed)
He is undergoing prostate cancer treatments at this time. He is not interested in knee replacement or orthopedist referral at this time. We will try Vicodin q 12 hours for a month to see if this helps. I would like to give this long term but I think it is reasonable considering his active cancer at this time. We will re-assess in 1 month.

## 2011-12-19 ENCOUNTER — Encounter (HOSPITAL_COMMUNITY): Payer: Self-pay | Admitting: Emergency Medicine

## 2011-12-19 ENCOUNTER — Emergency Department (HOSPITAL_COMMUNITY): Payer: PRIVATE HEALTH INSURANCE

## 2011-12-19 ENCOUNTER — Observation Stay (HOSPITAL_COMMUNITY)
Admission: EM | Admit: 2011-12-19 | Discharge: 2011-12-20 | Disposition: A | Discharge status: Home / Self Care | Payer: PRIVATE HEALTH INSURANCE | Attending: Emergency Medicine | Admitting: Emergency Medicine

## 2011-12-19 DIAGNOSIS — I2089 Other forms of angina pectoris: Secondary | ICD-10-CM | POA: Diagnosis present

## 2011-12-19 DIAGNOSIS — M2550 Pain in unspecified joint: Secondary | ICD-10-CM | POA: Diagnosis present

## 2011-12-19 DIAGNOSIS — R079 Chest pain, unspecified: Secondary | ICD-10-CM

## 2011-12-19 DIAGNOSIS — K219 Gastro-esophageal reflux disease without esophagitis: Secondary | ICD-10-CM

## 2011-12-19 DIAGNOSIS — R61 Generalized hyperhidrosis: Secondary | ICD-10-CM | POA: Insufficient documentation

## 2011-12-19 DIAGNOSIS — R0789 Other chest pain: Secondary | ICD-10-CM

## 2011-12-19 DIAGNOSIS — J449 Chronic obstructive pulmonary disease, unspecified: Secondary | ICD-10-CM | POA: Diagnosis present

## 2011-12-19 DIAGNOSIS — R059 Cough, unspecified: Secondary | ICD-10-CM | POA: Insufficient documentation

## 2011-12-19 DIAGNOSIS — R05 Cough: Secondary | ICD-10-CM | POA: Insufficient documentation

## 2011-12-19 DIAGNOSIS — J4489 Other specified chronic obstructive pulmonary disease: Secondary | ICD-10-CM | POA: Insufficient documentation

## 2011-12-19 DIAGNOSIS — G252 Other specified forms of tremor: Secondary | ICD-10-CM | POA: Insufficient documentation

## 2011-12-19 DIAGNOSIS — I10 Essential (primary) hypertension: Secondary | ICD-10-CM | POA: Insufficient documentation

## 2011-12-19 DIAGNOSIS — E785 Hyperlipidemia, unspecified: Secondary | ICD-10-CM | POA: Insufficient documentation

## 2011-12-19 DIAGNOSIS — I208 Other forms of angina pectoris: Secondary | ICD-10-CM | POA: Diagnosis present

## 2011-12-19 DIAGNOSIS — G250 Essential tremor: Secondary | ICD-10-CM | POA: Insufficient documentation

## 2011-12-19 HISTORY — DX: Chest pain, unspecified: R07.9

## 2011-12-19 HISTORY — DX: Other forms of angina pectoris: I20.89

## 2011-12-19 LAB — CBC
HCT: 40.1 % (ref 39.0–52.0)
MCHC: 33.7 g/dL (ref 30.0–36.0)
Platelets: 212 10*3/uL (ref 150–400)
RDW: 14.1 % (ref 11.5–15.5)
WBC: 2.6 10*3/uL — ABNORMAL LOW (ref 4.0–10.5)

## 2011-12-19 LAB — BASIC METABOLIC PANEL
BUN: 5 mg/dL — ABNORMAL LOW (ref 6–23)
Chloride: 102 mEq/L (ref 96–112)
GFR calc Af Amer: >90 mL/min (ref >90)
GFR calc non Af Amer: >90 mL/min (ref >90)
Potassium: 3.6 mEq/L (ref 3.5–5.1)
Sodium: 140 mEq/L (ref 135–145)

## 2011-12-19 LAB — TROPONIN I
Troponin I: <0.3 ng/mL (ref <0.30)
Troponin I: <0.3 ng/mL (ref <0.30)

## 2011-12-19 MED ORDER — SODIUM CHLORIDE 0.9 % IV SOLN: 250.0000 mL | INTRAVENOUS | Status: DC | PRN

## 2011-12-19 MED ORDER — ACETAMINOPHEN 325 MG PO TABS
650.0000 mg | ORAL_TABLET | Freq: Four times a day (QID) | ORAL | Status: DC | PRN
  Administered 2011-12-19: 650 mg via ORAL
  Filled 2011-12-19: qty 2

## 2011-12-19 MED ORDER — ONDANSETRON HCL 4 MG/2ML IJ SOLN: 4.0000 mg | Freq: Four times a day (QID) | INTRAMUSCULAR | Status: DC | PRN

## 2011-12-19 MED ORDER — ONDANSETRON HCL 4 MG PO TABS: 4.0000 mg | ORAL_TABLET | Freq: Four times a day (QID) | ORAL | Status: DC | PRN

## 2011-12-19 MED ORDER — LOTEPREDNOL ETABONATE 0.5 % OP SUSP
1.0000 [drp] | Freq: Every day | OPHTHALMIC | Status: DC
  Administered 2011-12-19: 1 [drp] via OPHTHALMIC
  Filled 2011-12-19: qty 5

## 2011-12-19 MED ORDER — SODIUM CHLORIDE 0.9 % IJ SOLN
3.0000 mL | Freq: Two times a day (BID) | INTRAMUSCULAR | Status: DC
  Administered 2011-12-19: 3 mL via INTRAVENOUS

## 2011-12-19 MED ORDER — ALUM & MAG HYDROXIDE-SIMETH 200-200-20 MG/5ML PO SUSP: 30.0000 mL | Freq: Four times a day (QID) | ORAL | Status: DC | PRN

## 2011-12-19 MED ORDER — PANTOPRAZOLE SODIUM 40 MG PO TBEC
40.0000 mg | DELAYED_RELEASE_TABLET | Freq: Two times a day (BID) | ORAL | Status: DC
  Administered 2011-12-19 – 2011-12-20 (×2): 40 mg via ORAL
  Filled 2011-12-19: qty 1

## 2011-12-19 MED ORDER — HYDRALAZINE HCL 20 MG/ML IJ SOLN: 2.0000 mg | INTRAMUSCULAR | Status: DC | PRN

## 2011-12-19 MED ORDER — ALBUTEROL SULFATE HFA 108 (90 BASE) MCG/ACT IN AERS
1.0000 | INHALATION_SPRAY | RESPIRATORY_TRACT | Status: DC | PRN
  Filled 2011-12-19: qty 6.7

## 2011-12-19 MED ORDER — MORPHINE SULFATE 2 MG/ML IJ SOLN: 2.0000 mg | INTRAMUSCULAR | Status: DC | PRN

## 2011-12-19 MED ORDER — LISINOPRIL-HYDROCHLOROTHIAZIDE 20-12.5 MG PO TABS: 1.0000 | ORAL_TABLET | Freq: Every day | ORAL | Status: DC

## 2011-12-19 MED ORDER — SENNOSIDES-DOCUSATE SODIUM 8.6-50 MG PO TABS
1.0000 | ORAL_TABLET | Freq: Every evening | ORAL | Status: DC | PRN
  Filled 2011-12-19: qty 1

## 2011-12-19 MED ORDER — SODIUM CHLORIDE 0.9 % IJ SOLN: 3.0000 mL | INTRAMUSCULAR | Status: DC | PRN

## 2011-12-19 MED ORDER — NITROGLYCERIN 0.4 MG SL SUBL
0.4000 mg | SUBLINGUAL_TABLET | SUBLINGUAL | Status: DC | PRN
  Administered 2011-12-19 (×3): 0.4 mg via SUBLINGUAL
  Filled 2011-12-19: qty 75

## 2011-12-19 MED ORDER — HYDROCHLOROTHIAZIDE 12.5 MG PO CAPS
12.5000 mg | ORAL_CAPSULE | Freq: Every day | ORAL | Status: DC
  Administered 2011-12-19 – 2011-12-20 (×2): 12.5 mg via ORAL
  Filled 2011-12-19 (×2): qty 1

## 2011-12-19 MED ORDER — LOTEPREDNOL ETABONATE 0.2 % OP SUSP: 1.0000 [drp] | Freq: Every day | OPHTHALMIC | Status: DC

## 2011-12-19 MED ORDER — PANTOPRAZOLE SODIUM 40 MG PO TBEC
40.0000 mg | DELAYED_RELEASE_TABLET | Freq: Every day | ORAL | Status: DC
  Filled 2011-12-19: qty 1

## 2011-12-19 MED ORDER — ALBUTEROL 90 MCG/ACT IN AERS: 1.0000 | INHALATION_SPRAY | RESPIRATORY_TRACT | Status: DC | PRN

## 2011-12-19 MED ORDER — LISINOPRIL 20 MG PO TABS
20.0000 mg | ORAL_TABLET | Freq: Every day | ORAL | Status: DC
  Administered 2011-12-19 – 2011-12-20 (×2): 20 mg via ORAL
  Filled 2011-12-19 (×2): qty 1

## 2011-12-19 MED ORDER — ACETAMINOPHEN 650 MG RE SUPP: 650.0000 mg | Freq: Four times a day (QID) | RECTAL | Status: DC | PRN

## 2011-12-19 MED ORDER — ASPIRIN EC 81 MG PO TBEC
81.0000 mg | DELAYED_RELEASE_TABLET | Freq: Every day | ORAL | Status: DC
  Administered 2011-12-19 – 2011-12-20 (×2): 81 mg via ORAL
  Filled 2011-12-19 (×2): qty 1

## 2011-12-19 MED ORDER — HEPARIN SODIUM (PORCINE) 5000 UNIT/ML IJ SOLN
5000.0000 [IU] | Freq: Three times a day (TID) | INTRAMUSCULAR | Status: DC
  Administered 2011-12-19 – 2011-12-20 (×3): 5000 [IU] via SUBCUTANEOUS
  Filled 2011-12-19 (×6): qty 1

## 2011-12-19 MED ORDER — ASPIRIN 81 MG PO TBEC: 81.0000 mg | DELAYED_RELEASE_TABLET | Freq: Every day | ORAL | Status: DC

## 2011-12-19 NOTE — ED Notes (Addendum)
Reports given to [employer]

## 2011-12-19 NOTE — Progress Notes (Signed)
Discussed code status with him  He would wish resuscitation but not prolonged intubation or treatments if not beneficial

## 2011-12-19 NOTE — ED Notes (Signed)
To ED from home for sudden onset CP at 0300 which woke pt from sleep and radiated to his back, EMS gave 324 ASA and 2 nitro - pain decreased from 7/10 - 2/10, initial SBP 202, last EMS SBP 138, denies SOB or other complaints, 22g left hand, NAD

## 2011-12-19 NOTE — ED Provider Notes (Signed)
History     CSN: [id number]  Arrival date & time 12/19/11  0757   First MD Initiated Contact with Patient 12/19/11 0805      Chief Complaint  Patient presents with  . Chest Pain    (Consider location/radiation/quality/duration/timing/severity/associated sxs/prior treatment) Patient is a 71 y.o. male presenting with chest pain. The history is provided by the patient.  Chest Pain Primary symptoms include cough. Pertinent negatives for primary symptoms include no shortness of breath, no abdominal pain, no nausea and no vomiting.  Associated symptoms include diaphoresis.  Pertinent negatives for associated symptoms include no numbness and no weakness.    patient woke with left-sided chest pain around 3 in the morning. Went to his back. He states it is sharp and dull. Pain improved after nitroglycerin by EMS. He states it is almost gone now. He's had a cough for the last 4 days with a little bit of sputum production. No fevers. Patient states that he was told that he was sweating when EMS was there. He states he does not have a heart problems. No Abdominal pain. No nausea vomiting or diarrhea. Patient states he previously had prostate cancer but is cancer free now. He is a former smoker.  Past Medical History  Diagnosis Date  . Arthritis     hands, back, knees  . Anxiety   . Depression   . GERD (gastroesophageal reflux disease)   . Glaucoma   . Hypertension   . Prostate cancer   . Heart murmur asymptomatic  . Short of breath on exertion   . Asthma   . Nocturia   . Hx of radioisotope therapy 07/26/11    Radiosactive Prostate Seed Implant/ I-125 Seeds    Past Surgical History  Procedure Date  . Shoulder surgery YRS AGO    LEFT  . Lumbar fusion 1995  . Radioactive seed implant 09/14/2011    Procedure: RADIOACTIVE SEED IMPLANT;  Surgeon: Lindaann Slough, MD;  Location: [facility];  Service: Urology;  Laterality: N/A;  Total number of seeds -    . Cystoscopy  09/14/2011    Procedure: CYSTOSCOPY FLEXIBLE;  Surgeon: Lindaann Slough, MD;  Location: [facility];  Service: Urology;  Laterality: N/A;  No seeds seen in the bladder    Family History  Problem Relation Age of Onset  . Diabetes Mother     History  Substance Use Topics  . Smoking status: Former Smoker -- 2.0 packs/day for 50 years    Types: Cigarettes    Quit date: 10/13/2004  . Smokeless tobacco: Never Used     Comment: Still not smoking.   . Alcohol Use: 8.4 oz/week    14 Glasses of wine per week      Review of Systems  Constitutional: Positive for diaphoresis. Negative for activity change and appetite change.  HENT: Negative for neck stiffness.   Eyes: Negative for pain.  Respiratory: Positive for cough. Negative for chest tightness and shortness of breath.   Cardiovascular: Positive for chest pain. Negative for leg swelling.  Gastrointestinal: Negative for nausea, vomiting, abdominal pain and diarrhea.  Genitourinary: Negative for flank pain.  Musculoskeletal: Negative for back pain.  Skin: Negative for rash.  Neurological: Negative for weakness, numbness and headaches.  Psychiatric/Behavioral: Negative for behavioral problems.    Allergies  Tramadol  Home Medications   No current outpatient prescriptions on file.  BP 161/95  Pulse 101  Temp 98.4 F (36.9 C) (Oral)  Resp 18  Ht 6' (1.829  m)  Wt 179 lb 11.2 oz (81.511 kg)  BMI 24.37 kg/m2  SpO2 98%  Physical Exam  Nursing note and vitals reviewed. Constitutional: He is oriented to person, place, and time. He appears well-developed and well-nourished.  HENT:  Head: Normocephalic and atraumatic.  Eyes: EOM are normal. Pupils are equal, round, and reactive to light.  Neck: Normal range of motion. Neck supple.  Cardiovascular: Normal rate, regular rhythm and normal heart sounds.   No murmur heard. Pulmonary/Chest: Effort normal and breath sounds normal.  Abdominal: Soft. Bowel sounds are  normal. He exhibits no distension and no mass. There is no tenderness. There is no rebound and no guarding.  Musculoskeletal: Normal range of motion. He exhibits no edema.  Neurological: He is alert and oriented to person, place, and time. No cranial nerve deficit.  Skin: Skin is warm and dry.  Psychiatric: He has a normal mood and affect.    ED Course  Procedures (including critical care time)  Labs Reviewed  CBC - Abnormal; Notable for the following:    WBC 2.6 (*)     All other components within normal limits  BASIC METABOLIC PANEL - Abnormal; Notable for the following:    Glucose, Bld 106 (*)     BUN 5 (*)     All other components within normal limits  TROPONIN I  TROPONIN I  TSH  TROPONIN I  TROPONIN I  HEMOGLOBIN A1C   Dg Chest Port 1 View  12/19/2011  *RADIOLOGY REPORT*  Clinical Data: Chest pain  PORTABLE CHEST - 1 VIEW  Comparison: 07/27/2011  Findings: Heart size is normal.  No pleural effusion or edema.  No airspace consolidation.  Lung volumes are low and there is asymmetric elevation of the right hemidiaphragm.  IMPRESSION:  1.  Low lung volumes with asymmetric elevation of the right hemidiaphragm.   Original Report Authenticated By: Signa Kell, M.D.      1. Chest pain      Date: 12/19/2011  Rate: 104  Rhythm: sinus tachycardia  QRS Axis: normal  Intervals: normal  ST/T Wave abnormalities: normal  Conduction Disutrbances:none  Narrative Interpretation: artifact in multiple leads  Old EKG Reviewed: unchanged    MDM  Patient with chest pain. Leave by nitroglycerin for EMS. EKG is overall reassuring enzymes are negative. He'll be admitted to medicine for further workup.        Juliet Rude. Rubin Payor, MD 12/19/11 1536

## 2011-12-19 NOTE — H&P (Signed)
Family Medicine Teaching Service Attending Note  I interviewed and examined patient Joel Donaldson and reviewed their tests and x-rays.  I discussed with Dr. Louanne Belton and reviewed their note for today.  I agree with their assessment and plan.     Additionally  Currently feels well Can reguarly walk 1/2 mi without chest pain or shortness of breath  Does have history of GERD that is somewhat similar to his recent pain but not as severe  Chest pain - no signs of ACS but would rule out for mi with enzymes.  Most consistent with GERD - increase PPI and stop nsaids  If he remains asymptomatic and labs are normal he would prefer an outpatient cardiac work up

## 2011-12-19 NOTE — ED Notes (Signed)
Patient states headache throbbing 3/10 alert answering and following commands appropriate .

## 2011-12-19 NOTE — ED Notes (Signed)
Pt changed into gown.

## 2011-12-19 NOTE — Progress Notes (Signed)
UR Completed Kamera Dubas Graves-Bigelow, RN,BSN [phone]  

## 2011-12-19 NOTE — H&P (Signed)
Family Medicine Teaching [facility] Admission History and Physical Service Pager: [phone]  Patient name: Joel Donaldson Medical record number: [id number] Date of birth: Jan 24, 1941 Age: 71 y.o. Gender: male  Primary Care Provider: [facility] PARK, Marylene Land, MD  Chief Complaint: Chest pain  Assessment and Plan: Joel Donaldson is a 71 y.o. year old male presenting with atypical chest pain. 1. Chest pain: Atypical in nature, however, patient is high risk for coronary events so will plan on observation overnight for cardiac rule out. 1. Troponins 2. FLP/TSH/A1c 3. Tele bed 4. Will discuss with the patient whether he wants inpatient vs outpatient cardiology consultation 2. HLD: Listed in patient problem list but not on any medication for this.  Will recheck FLP and see if he needs to start a medication. 3. HTN: Currently somewhat hypertensive.  Will give daily bp meds, give PRN of hydralazine, and observe. 4. COPD: Currently not symptomatic.  Continue PRN albuterol.  No longer a smoker. 5. Leukopenia: Appears chronic.  Will defer any further management to PCP. 6. FEN/GI: HHD 7. Prophylaxis: SQH 8. Disposition: Tele bed, plan overnight stay, obs status 9. Code Status: DNR/DNI  History of Present Illness: Joel Donaldson is a 71 y.o. year old male presenting with chest pain beginning 3am.  Awoke from sleep.  Some radiation to right arm but not neck.  Has some baseline shortness of breath but not above baseline.  No diaphoresis.  Called EMS at 7am when was not better.  EMS gave some nitro and had some relief.  Still with some discomfort.  No prior CAD history.  Prior smoker, HLD, HTN.  No problems with DM.  No visual changes, slight weakness in legs (chronic).  Has a little bit of problems with urinary retention (chronic) but no n/v/d or abd pain.  No bruising or rashes.  Patient Active Problem List  Diagnosis  . HYPERLIPIDEMIA  . INTENTION TREMOR  . MIGRAINE, UNSPEC., W/O INTRACTABLE  MIGRAINE  . HYPERTENSION, BENIGN SYSTEMIC  . COPD  . GASTROESOPHAGEAL REFLUX, NO ESOPHAGITIS  . POLYARTHRITIS  . Prostate cancer  . Alcohol use  . Syncope  . Osteoarthritis of both knees  . Preventative health care   Past Medical History: Past Medical History  Diagnosis Date  . Arthritis     hands, back, knees  . Anxiety   . Depression   . GERD (gastroesophageal reflux disease)   . Glaucoma   . Hypertension   . Prostate cancer   . Heart murmur asymptomatic  . Short of breath on exertion   . Asthma   . Nocturia   . Hx of radioisotope therapy 07/26/11    Radiosactive Prostate Seed Implant/ I-125 Seeds   Past Surgical History: Past Surgical History  Procedure Date  . Shoulder surgery YRS AGO    LEFT  . Lumbar fusion 1995  . Radioactive seed implant 09/14/2011    Procedure: RADIOACTIVE SEED IMPLANT;  Surgeon: Lindaann Slough, MD;  Location: [facility];  Service: Urology;  Laterality: N/A;  Total number of seeds -    . Cystoscopy 09/14/2011    Procedure: CYSTOSCOPY FLEXIBLE;  Surgeon: Lindaann Slough, MD;  Location: [facility];  Service: Urology;  Laterality: N/A;  No seeds seen in the bladder   Social History: History  Substance Use Topics  . Smoking status: Former Smoker -- 2.0 packs/day for 50 years    Types: Cigarettes    Quit date: 10/13/2004  . Smokeless tobacco: Never Used  Comment: Still not smoking.   . Alcohol Use: 8.4 oz/week    14 Glasses of wine per week   For any additional social history documentation, please refer to relevant sections of EMR.  Family History: Family History  Problem Relation Age of Onset  . Diabetes Mother    Allergies: Allergies  Allergen Reactions  . Tramadol Nausea Only   Current Facility-Administered Medications on File Prior to Encounter  Medication Dose Route Frequency Provider Last Rate Last Dose  . methylPREDNISolone acetate (DEPO-MEDROL) injection 40 mg  40 mg Intramuscular Once Durwin Reges, MD       Current Outpatient Prescriptions on File Prior to Encounter  Medication Sig Dispense Refill  . albuterol (PROVENTIL,VENTOLIN) 90 MCG/ACT inhaler Inhale 1-2 puffs into the lungs every 4 (four) hours as needed. May use 1 or 2 puffs  17 g  6  . aspirin 81 MG EC tablet Take 81 mg by mouth daily.       Marland Kitchen HYDROcodone-acetaminophen (VICODIN) 5-500 MG per tablet Take 1 tablet by mouth 2 (two) times daily as needed for pain.  60 tablet  0  . lisinopril-hydrochlorothiazide (PRINZIDE,ZESTORETIC) 20-12.5 MG per tablet Take 1 tablet by mouth daily.  30 tablet  2  . loteprednol (ALREX) 0.2 % SUSP Place 1 drop into both eyes daily.      . naproxen (NAPROSYN) 250 MG tablet Take 250 mg by mouth daily.      Marland Kitchen omeprazole (PRILOSEC) 40 MG capsule Take 40 mg by mouth every morning.       Review Of Systems: Per HPI.  Physical Exam: BP 149/84  Pulse 102  Temp 99 F (37.2 C) (Oral)  Resp 16  Wt 180 lb (81.647 kg)  SpO2 100% Exam: General: NAD, lying in bed HEENT: MMM, EOMI, no teeth, no adenopathy Cardiovascular: RRR, I do not appreciate a murmur at this time, 2+ pulses Respiratory: CTABL, mildly increased expiratory time but no focal findings Chest: Slight left sided chest discomfort with palpation, no right sided discomfort Abdomen: SNTND Extremities: 2+ pulses, no edema Skin: No rashes Neuro: Alert and oriented x3, no weakness  Labs and Imaging: CBC BMET   Lab 12/19/11 0756  WBC 2.6*  HGB 13.5  HCT 40.1  PLT 212    Lab 12/19/11 0756  NA 140  K 3.6  CL 102  CO2 28  BUN 5*  CREATININE 0.65  GLUCOSE 106*  CALCIUM 8.7    CXR: No acute process EKG: Sinus tach (rate 104), no acute ST segment changes Troponin: <0.3  Jamea Robicheaux, MD 12/19/2011, 11:05 AM

## 2011-12-19 NOTE — Care Management Note (Unsigned)
    Page 1 of 1   12/19/2011     1:11:00 PM   CARE MANAGEMENT NOTE 12/19/2011  Patient:  Joel Donaldson, Joel Donaldson   Account Number:  [id number]  Date Initiated:  12/19/2011  Documentation initiated by:  GRAVES-BIGELOW,Jhonatan Lomeli  Subjective/Objective Assessment:   Pt admitte with cp.     Action/Plan:   CM will continue ot monitor for disposition needs.   Anticipated DC Date:  12/20/2011   Anticipated DC Plan:  HOME/SELF CARE      DC Planning Services  CM consult      Choice offered to / List presented to:             Status of service:  In process, will continue to follow Medicare Important Message given?   (If response is "NO", the following Medicare IM given date fields will be blank) Date Medicare IM given:   Date Additional Medicare IM given:    Discharge Disposition:    Per UR Regulation:  Reviewed for med. necessity/level of care/duration of stay  If discussed at Long Length of Stay Meetings, dates discussed:    Comments:

## 2011-12-20 LAB — BASIC METABOLIC PANEL
BUN: 4 mg/dL — ABNORMAL LOW (ref 6–23)
Chloride: 100 mEq/L (ref 96–112)
Creatinine, Ser: 0.73 mg/dL (ref 0.50–1.35)
GFR calc Af Amer: >90 mL/min (ref >90)
GFR calc non Af Amer: >90 mL/min (ref >90)
Glucose, Bld: 102 mg/dL — ABNORMAL HIGH (ref 70–99)
Potassium: 3 mEq/L — ABNORMAL LOW (ref 3.5–5.1)

## 2011-12-20 LAB — HEMOGLOBIN A1C: Hgb A1c MFr Bld: 5.9 % — ABNORMAL HIGH (ref <5.7)

## 2011-12-20 LAB — TSH: TSH: 1.256 u[IU]/mL (ref 0.350–4.500)

## 2011-12-20 LAB — CBC
HCT: 39 % (ref 39.0–52.0)
Hemoglobin: 13.2 g/dL (ref 13.0–17.0)
MCH: 28.2 pg (ref 26.0–34.0)
MCHC: 33.8 g/dL (ref 30.0–36.0)
RDW: 14.1 % (ref 11.5–15.5)

## 2011-12-20 LAB — TROPONIN I: Troponin I: <0.3 ng/mL (ref <0.30)

## 2011-12-20 LAB — LIPID PANEL
LDL Cholesterol: 102 mg/dL — ABNORMAL HIGH (ref 0–99)
VLDL: 18 mg/dL (ref 0–40)

## 2011-12-20 MED ORDER — METOPROLOL TARTRATE 12.5 MG HALF TABLET
12.5000 mg | ORAL_TABLET | Freq: Two times a day (BID) | ORAL | Status: DC
  Administered 2011-12-20: 12.5 mg via ORAL
  Filled 2011-12-20 (×2): qty 1

## 2011-12-20 MED ORDER — NITROGLYCERIN 0.4 MG SL SUBL: 0.4000 mg | SUBLINGUAL_TABLET | SUBLINGUAL | Status: DC | PRN

## 2011-12-20 MED ORDER — POTASSIUM CHLORIDE 20 MEQ PO PACK
40.0000 meq | PACK | Freq: Two times a day (BID) | ORAL | Status: DC
  Administered 2011-12-20: 40 meq via ORAL
  Filled 2011-12-20 (×2): qty 2

## 2011-12-20 NOTE — Progress Notes (Signed)
Family Medicine Teaching Service Attending Note  I interviewed and examined patient Joel Donaldson and reviewed their tests and x-rays.  I discussed with Dr. Casper Harrison and reviewed their note for today.  I agree with their assessment and plan.     Additionally  Feels well up walking around No more chest pain after doubling his PPI No signs of cardiac ischemia  Would check on EKG today.  If normal  Ok to discharge to follow up with PCP and cardiology for risk stratification

## 2011-12-20 NOTE — Progress Notes (Signed)
Entered in error as a progress note. Refiled properly as DC Summary. --Burnis Medin, MD

## 2011-12-20 NOTE — Progress Notes (Signed)
PGY-1 Update Note  Repeat EKG reviewed, compared to previous tracing from August. Not significantly changed, not worrisome for ischemia. Pt to be discharged today with close PCP follow-up and cardiology follow-up in four weeks. Discussed follow-up appointment times, new medication (metoprolol) and when/how to take nitroglycerin, as well as when to call the clinic or 911 if he has any more chest pain or other issues.  Bobbye Morton, MD PGY-1, [facility] Family Medicine FPTS Intern pager: [phone]

## 2011-12-20 NOTE — Discharge Summary (Signed)
Family Medicine Teaching [facility] Discharge Summary  Patient name: Joel Donaldson Medical record number: [id number] Date of birth: Nov 03, 1940 Age: 71 y.o. Gender: male Date of Admission: 12/19/2011  Date of Discharge: 12/20/2011 Admitting Physician: Carney Living, MD  Primary Care Provider: [facility], Marylene Land, MD  Indication for Hospitalization: chest pain awakening pt from rest Discharge Diagnoses:  Chest pain HTN HLD COPD GERD Polyarthritis Intention tremor  Consultations: none  Significant Labs and Imaging:   Lab 12/20/11 0143 12/19/11 0756  WBC 2.7* 2.6*  HGB 13.2 13.5  HCT 39.0 40.1  PLT 199 212    Lab 12/20/11 0143 12/19/11 0756  NA 138 140  K 3.0* 3.6  CL 100 102  CO2 28 28  BUN 4* 5*  CREATININE 0.73 0.65  CALCIUM 9.0 8.7  PROT -- --  BILITOT -- --  ALKPHOS -- --  ALT -- --  AST -- --  GLUCOSE 102* 106*    Lab 12/20/11 0142 12/19/11 2003 12/19/11 1329  CKTOTAL -- -- --  TROPONINI <0.30 <0.30 <0.30  TROPONINT -- -- --  CKMBINDEX -- -- --   Hb A1c, 11/6: 5.9 TSH, 11/6: 1.256  CXR, 11/6 @0814   Findings: Heart size is normal. No pleural effusion or edema. No  airspace consolidation. Lung volumes are low and there is  asymmetric elevation of the right hemidiaphragm.  IMPRESSION:  1. Low lung volumes with asymmetric elevation of the right  hemidiaphragm.  EKG, 11/7: NSR, no significant change from tracing in August 2013  Procedures: none  Brief Hospital Course: Joel Donaldson is a 71yo male with history of HTN, COPD, chronic leukopenia, and hyperlipidemia presenting with chest pain. Placed in observation 11/6. No further pain as of 11/7 AM. Pt offered inpt cardiology referral but prefers outpt work-up and thus far troponins have been negative and EKG is unchanged from two months ago. Pt discharged 11/7 with scheduled close PCP and cardiology follow-up. See below by problem list.   1. Chest pain - Atypical but with several risk factors;  awoke pt from sleep (pain at rest) with radiation to arm, some relief with nitro. EKG's in the ED and repeat are both without changes from previous tracing. No continued chest pain morning of 11/7. A1c and TSH both within the normal range. Pt was continued on ASA, lisinopril-HCTZ. Started metoprolol 12.5 BID 11/7. Pt is to follow-up with [facility]iology, Dr. Katrinka Blazing (pt states "I saw him once a long time ago").  2. HLD - Listed in pt's problem list but not on any medications at time of presentation to the hospital. Lipid panel 11/6 shows LDL 102, HDL 47, TG 90. Considered starting low-dose statin, however pt has no known CAD or equivalent risk factors other than being a previous smoker and goal LDL would likely be <130.  3. HTN - Intermittently hypertensive during admission, 180's/100's in the ED; on lisinopril-HCTZ at home. Improved but still elevated 11/7, at 152/92. Started low-dose metoprolol, as above.   4. COPD - Former smoker, no current complaints of SOB, no wheeze on exam. Albuterol PRN was ordered but not required.  5. Chronic leukopenia - Stable with no signs/symptoms of infection. Inpt team defers any further work-up to PCP/outpt setting.  Discharge Medications:    Medication List     As of 12/20/2011  2:41 PM    STOP taking these medications         ALREX 0.2 % Susp   Generic drug: loteprednol      TAKE  these medications         albuterol 90 MCG/ACT inhaler   Commonly known as: PROVENTIL,VENTOLIN   Inhale 1-2 puffs into the lungs every 4 (four) hours as needed. May use 1 or 2 puffs      aspirin 81 MG EC tablet   Take 81 mg by mouth daily.      HYDROcodone-acetaminophen 5-500 MG per tablet   Commonly known as: VICODIN   Take 1 tablet by mouth 2 (two) times daily as needed for pain.      lisinopril-hydrochlorothiazide 20-12.5 MG per tablet   Commonly known as: PRINZIDE,ZESTORETIC   Take 1 tablet by mouth daily.      naproxen 250 MG tablet   Commonly known as: NAPROSYN    Take 250 mg by mouth daily.      nitroGLYCERIN 0.4 MG SL tablet   Commonly known as: NITROSTAT   Place 1 tablet (0.4 mg total) under the tongue every 5 (five) minutes x 3 doses as needed for chest pain.      omeprazole 40 MG capsule   Commonly known as: PRILOSEC   Take 40 mg by mouth every morning.       Issues for Follow Up:  1. Chest pain - Please address any further chest pain and/or concerns with new meds (given Rx for metoprolol 12.5 mg BID and ; history is suggestive of atypical pain with possible GI/MSK component. Pt preferred outpt work-up and is to be seen by Dr. Katrinka Blazing of [facility] on 12/9. Continued on aspirin, ACE. 2. HTN  - Intermittent elevations during admission, as high as 180's systolic. Continued lisinopril and added low-dose metoprolol. Follow-up BP's as routine at follow-up visits. 3. HLD - No previous medications with HDL 47 and LDL 102. Statin therapy not intiated as pt does not have known CAD or DM and goal LDL is likely <130. Follow up any further concerns as routine.  Outstanding Results: none  Discharge Instructions: Please refer to Patient Instructions section of EMR for full details.  Patient was counseled important signs and symptoms that should prompt return to medical care, changes in medications, dietary instructions, activity restrictions, and follow up appointments.       Follow-up Information    Follow up with Lesleigh Noe, MD. On 01/21/2012. (Please arrive for appointment by 8:45 AM)    Contact information:   [address] [place] [address] [phone]       Follow up with [facility], ANGELA, MD. On 12/24/2011. (Appointment time is 9:00 AM)    Contact information:   [address] [place] [address] [phone]         Discharge Condition: stable  [address], [place], MD 12/20/2011, 2:41 PM

## 2011-12-20 NOTE — Progress Notes (Signed)
Family Medicine Teaching Service Daily Progress Note Intern Pager: [phone]  Patient name: Joel Donaldson Medical record number: [id number] Date of birth: [birth year] Age: 71 y.o. Gender: male  Primary Care Provider: [facility] PARK, ANGELA, MD  Subjective: Pt seen at bedside. Feels well this morning, eager to go home. No complaints; no further chest pain, no SOB, no N/V, abdominal pain, pain/swelling in extremities. States he thinks he saw Dr. Katrinka Blazing (cardiologist) "a long time ago."  Objective: Temp:  [98 F (36.7 C)-99.3 F (37.4 C)] 98 F (36.7 C) (11/07 0640) Pulse Rate:  [63-105] 63  (11/07 0640) Resp:  [15-18] 17  (11/07 0640) BP: (105-183)/(76-108) 152/92 mmHg (11/07 0640) SpO2:  [97 %-100 %] 98 % (11/07 0640) Weight:  [179 lb 11.2 oz (81.511 kg)] 179 lb 11.2 oz (81.511 kg) (11/06 1300) Exam: General: elderly male sitting on edge of bed/moving about room in NAD, very pleasant Cardiovascular: RRR, normal S1/S2, no murmur appreciated; minimal left-sided chest wall tenderness Respiratory: CTAB, no wheezes or increased work of breathing Abdomen: soft, nontender/nondistended, BS+ Extremities: warm/well-perfused, distal pulses 2+ and symmetric  Laboratory:  Lab 12/20/11 0143 12/19/11 0756  WBC 2.7* 2.6*  HGB 13.2 13.5  HCT 39.0 40.1  PLT 199 212    Lab 12/20/11 0143 12/19/11 0756  NA 138 140  K 3.0* 3.6  CL 100 102  CO2 28 28  BUN 4* 5*  CREATININE 0.73 0.65  CALCIUM 9.0 8.7  PROT -- --  BILITOT -- --  ALKPHOS -- --  ALT -- --  AST -- --  GLUCOSE 102* 106*    Lab 12/20/11 0142 12/19/11 2003 12/19/11 1329  CKTOTAL -- -- --  TROPONINI <0.30 <0.30 <0.30  TROPONINT -- -- --  CKMBINDEX -- -- --   Hb A1c 11/6: 5.9 TSH 11/6: 1.256  Imaging/Diagnostic Tests: CXR, 11/6 @0814  Findings: Heart size is normal. No pleural effusion or edema. No  airspace consolidation. Lung volumes are low and there is  asymmetric elevation of the right hemidiaphragm.  IMPRESSION:  1. Low  lung volumes with asymmetric elevation of the right  hemidiaphragm.   Assessment and Plan: Joel Donaldson is a 71yo male with history of HTN, COPD, chronic leukopenia, and hyperlipidemia presenting with chest pain. Placed in observation 11/6. Currently feels well without complaint; states he would prefer outpt setting for further work-up as opposed to inpt.  1. Chest pain - Atypical but with several risk factors. EKG in the ED without changes from previous tracing. - No continued chest pain morning of 11/7.  - Continue ASA, lisinopril-HCTZ. Starting metoprolol 12.5 BID 11/7. Continue cardiac monitoring. - Follow-up scheduled with [facility] (see dispo below)  2. HLD - Lipid panel 11/6 shows LDL 102, HDL 47, TG 90. Will consider starting low-dose statin.  3. HTN - Intermittently hypertensive, 180's/100's in the ED, on lisinopril-HCTZ at home. Improved but still elevated this morning, 152/92. Will start low-dose metoprolol, as above.  4. COPD - Former smoker, no current complaints of SOB, no wheeze on exam. Albuterol PRN.  5. Chronic leukopenia - Stable with no signs/symptoms of infection, will defer any further work-up to PCP/outpt setting.  FEN/GI: heart-healthy diet, saline-lock IV; K 3.0 11/7, will replete with 40 mEq PO PPx: subQ heparin, continue PPI (home med) Dispo: Management as above; cardiology outpt appointment scheduled for 12/9 with Dr. Katrinka Blazing of [facility] (pt states he saw Dr. Katrinka Blazing "a long time ago") Code Status: Full code; would want resuscitation but not long-term intubation/treatments if not  beneficial  Street, [place], MD 12/20/2011, 9:01 AM

## 2011-12-21 NOTE — Discharge Summary (Signed)
I have reviewed this discharge summary and agree.    

## 2011-12-24 ENCOUNTER — Encounter: Payer: Self-pay | Admitting: Family Medicine

## 2011-12-24 ENCOUNTER — Ambulatory Visit (INDEPENDENT_AMBULATORY_CARE_PROVIDER_SITE_OTHER): Payer: PRIVATE HEALTH INSURANCE | Admitting: Family Medicine

## 2011-12-24 VITALS — BP 121/77 | HR 102 | Temp 97.5°F | Ht 72.0 in | Wt 179.0 lb

## 2011-12-24 DIAGNOSIS — R079 Chest pain, unspecified: Secondary | ICD-10-CM

## 2011-12-24 DIAGNOSIS — C61 Malignant neoplasm of prostate: Secondary | ICD-10-CM

## 2011-12-24 MED ORDER — HYDROCODONE-ACETAMINOPHEN 5-500 MG PO TABS: 1.0000 | ORAL_TABLET | Freq: Two times a day (BID) | ORAL | Status: DC | PRN

## 2011-12-24 MED ORDER — LISINOPRIL-HYDROCHLOROTHIAZIDE 20-12.5 MG PO TABS: 1.0000 | ORAL_TABLET | Freq: Every day | ORAL | Status: DC

## 2011-12-24 MED ORDER — OMEPRAZOLE 40 MG PO CPDR: 40.0000 mg | DELAYED_RELEASE_CAPSULE | Freq: Every morning | ORAL | Status: DC

## 2011-12-24 NOTE — Progress Notes (Signed)
  Subjective:    Patient ID: Joel Donaldson, male    DOB: 07/31/1940, 71 y.o.   MRN: [id number]  HPI # Hospital follow-up for chest pain, non-cardiac etiology He thinks it was due to his reflux because he ran out of his medications (omeprazole) He has not had any more episodes of chest pain since hospitalization He did not start metoprolol which was discussed in the hospital  Review of Systems Per HPI Denies chest pain, dyspnea, abdominal pain, acid taste in mouth  Allergies, medication, past medical history reviewed.      Objective:   Physical Exam GEN: NAD CV: RRR, normal S1/S2, no murmurs PULM: NI WOB; CTAB EXT: no edema    Assessment & Plan:

## 2011-12-24 NOTE — Assessment & Plan Note (Signed)
He was hospitalized overnight and ruled-out for ACS. He thinks the chest pain was due to reflux because he ran out of his medication. Rx re-filled today. Metoprolol was recommended at the time of discharge but he did not start. His blood pressure is fine today. When HR was re-checked, 90 bpm. We will hold off on starting this medication. He has a follow-up scheduled with his cardiologist Dr. Katrinka Blazing 12/09 and he was reminded to keep this appointment. Follow-up with me in January or sooner if needed.

## 2011-12-24 NOTE — Assessment & Plan Note (Signed)
His next appointment with urologist around 11/20.

## 2011-12-24 NOTE — Patient Instructions (Addendum)
Keep follow-up appointment with Dr Katrinka Blazing on 12/09 at 8:45 am  Avoid spicy or citrusy or fatty foods to help with reflux  Follow-up in January (before you need your Vicodin for January)

## 2012-01-15 ENCOUNTER — Other Ambulatory Visit: Payer: Self-pay | Admitting: Family Medicine

## 2012-01-16 ENCOUNTER — Ambulatory Visit (INDEPENDENT_AMBULATORY_CARE_PROVIDER_SITE_OTHER): Payer: PRIVATE HEALTH INSURANCE | Admitting: Family Medicine

## 2012-01-16 ENCOUNTER — Encounter: Payer: Self-pay | Admitting: Family Medicine

## 2012-01-16 VITALS — BP 133/77 | HR 102 | Temp 98.5°F | Ht 72.0 in | Wt 185.0 lb

## 2012-01-16 DIAGNOSIS — M25519 Pain in unspecified shoulder: Secondary | ICD-10-CM

## 2012-01-16 DIAGNOSIS — M25511 Pain in right shoulder: Secondary | ICD-10-CM

## 2012-01-16 MED ORDER — OXYCODONE HCL 10 MG PO TABS: 10.0000 mg | ORAL_TABLET | Freq: Three times a day (TID) | ORAL | Status: DC | PRN

## 2012-01-16 NOTE — Patient Instructions (Signed)
Follow-up next week.

## 2012-01-20 DIAGNOSIS — M67919 Unspecified disorder of synovium and tendon, unspecified shoulder: Secondary | ICD-10-CM | POA: Insufficient documentation

## 2012-01-20 NOTE — Progress Notes (Signed)
  Subjective:    Patient ID: Joel Donaldson, male    DOB: [birth year], 71 y.o.   MRN: [id number]  HPI # Right shoulder pain He woke up this morning and had severe pain whenever he moved his shoulder He denies change in activity or traumatic event (including past injury to that shoulder)  Review of Systems Denies fevers/chills  Allergies, medication, past medical history reviewed.      Objective:   Physical Exam GEN: appears very uncomfortable, holds right arm still with left hand RIGHT SHOULDER: tender throughout shoulder, particularly anterior shoulder in groove   No swelling or obvious lesions including rash   ROM: unable to abduct or flex to 90 deg (about 60 deg), minimal external rotation due to pain   Strength: decreased internal and external rotation   Maneuvers: positive Neers, Hawkins  Procedure: right subacromial shoulder glucocorticoid injection Informed written consent received  Area to be injected marked and then prepped with Betadine swabs x 2 and alcohol swabs x 2 Subacromial space marked 25 gauge needle inserted into space and 4 mL of 2% lidocaine without epinephrine and 40 mg of methylprednisone injected Needle removed and area covered with band-aide Minimal blood loss  Patient had increased movement of right shoulder (90 deg flexion and abduction, 30 deg extension)  Patient informed that pain may improve in next few hours but will be about the same prior to injection tomorrow morning. Expect improvement in 2 days and maximum affect in about a week. Patient expressed understanding.     Assessment & Plan:

## 2012-01-20 NOTE — Assessment & Plan Note (Signed)
He shows signs of impingement and improved symptoms immediately following glucocorticoid injection. He was demonstrated and given exercises, oxycodone prn pain, follow-up next week to re-assess.

## 2012-01-23 ENCOUNTER — Encounter: Payer: Self-pay | Admitting: Family Medicine

## 2012-01-23 ENCOUNTER — Ambulatory Visit (INDEPENDENT_AMBULATORY_CARE_PROVIDER_SITE_OTHER): Payer: PRIVATE HEALTH INSURANCE | Admitting: Family Medicine

## 2012-01-23 VITALS — BP 114/70 | HR 106 | Temp 98.4°F | Ht 72.0 in | Wt 190.0 lb

## 2012-01-23 DIAGNOSIS — M7541 Impingement syndrome of right shoulder: Secondary | ICD-10-CM

## 2012-01-23 DIAGNOSIS — M7500 Adhesive capsulitis of unspecified shoulder: Secondary | ICD-10-CM

## 2012-01-23 DIAGNOSIS — M7501 Adhesive capsulitis of right shoulder: Secondary | ICD-10-CM

## 2012-01-23 MED ORDER — MELOXICAM 15 MG PO TABS: 15.0000 mg | ORAL_TABLET | Freq: Every day | ORAL | Status: DC

## 2012-01-23 MED ORDER — OXYCODONE HCL 10 MG PO TABS: 10.0000 mg | ORAL_TABLET | Freq: Three times a day (TID) | ORAL | Status: DC | PRN

## 2012-01-23 NOTE — Assessment & Plan Note (Signed)
His ROM is significantly limited, and I am concerned about adhesive capsulitis. Patient would like to avoid surgery at this time, especially due to his being treated for other medical problems actively currently (prostate cancer; next appointment with Dr. Julien Girt next week). We will try physical therapy and continue home exercises, anti-inflammatories, oxycodone prn. He will follow-up in 2 weeks to re-assess. We will defer getting an MRI at this time; even if it shows a significant tear, he would like to try other modalities before pursuing surgery. If he does not show improvement with conservative management, he would be amenable to referral at that time.

## 2012-01-23 NOTE — Progress Notes (Signed)
Subjective:     Patient ID: Joel Donaldson, male   DOB: [birth year], 71 y.o.   MRN: [id number]  HPI  1. F/u to shoulder injection for adhesive capsulitis:  He states that he is feeling 20% better in terms of ROM and strength. He has been doing the exercises he was given twice a day.  He is taking his pain medication oxycodone every 8 hours. He also takes ibuprofen up to twice a day, although he is not sure if this is helping; he takes with food He wants to be able to use his arm like he use to.   Review of Systems Within normal limits other in HPI     Objective:   Physical Exam General: NAD HEENT: NCAT, EOMI Neuro: Oriented x 3, alert,  Cardio: s1S2, RRR,  Resp: CTA, no rhonchi, rales or wheezes MSK: right shoulder: patient was able to abduct and flex to 30 degress, passively he was able to abduct and flex to 90 degrees but with significant pain; internal rotation he is able to reach about 1/3 up his back (versus 2/3 up his back on left side), + Neer test and hawkins; 4/5 internal and external rotation (5/5 on left)      Assessment:       Plan:     1. Miloxicam was prescribed to help with the inflammation in the right shoulder.  He was also prescribed oxycodone to help with the pain.  He was referred to physical therapy as to gain more ROM and strength in his affected shoulder.  He is to f/u in 2 weeks to make sure he is progressing and if not a surgical referral may be warranted.

## 2012-01-23 NOTE — Patient Instructions (Addendum)
Try the meloxicam (anti-inflammatory) daily.  Take oxycodone every 8-12 hours as needed for severe pain. Do not take Vicodin with this medication.  Especially before physical therapy. If you don't hear from physical therapy in 1 week, please call and let me know.  Ice your shoulder for 20 minutes after physical therapy  Follow-up in 2 weeks.

## 2012-01-29 ENCOUNTER — Telehealth: Payer: Self-pay | Admitting: Family Medicine

## 2012-01-29 DIAGNOSIS — M25511 Pain in right shoulder: Secondary | ICD-10-CM

## 2012-01-29 NOTE — Telephone Encounter (Signed)
He reports his shoulder is doing "a little bit better". However, I would still like an x-ray of his shoulder due to the acuteness and significance of the pain. He has impingement symptoms, so he is currently diagnosed as having rotator cuff syndrome, however, with his history of prostate cancer that is being actively managed currently, I would like to rule-out bony process (metastases, pathologic fractures).  He is amenable to plan and plans on going over tomorrow to get x-ray.  I will call him if abnormal; if normal, we will discuss at follow-up on 12/27.

## 2012-01-31 ENCOUNTER — Ambulatory Visit
Admission: RE | Admit: 2012-01-31 | Discharge: 2012-01-31 | Disposition: A | Discharge status: Home / Self Care | Payer: PRIVATE HEALTH INSURANCE | Source: Ambulatory Visit | Attending: Family Medicine | Admitting: Family Medicine

## 2012-01-31 ENCOUNTER — Telehealth: Payer: Self-pay | Admitting: Family Medicine

## 2012-01-31 DIAGNOSIS — M25511 Pain in right shoulder: Secondary | ICD-10-CM

## 2012-01-31 NOTE — Telephone Encounter (Signed)
Personal Care Services form to be completed by [facility]. Fax to [phone]

## 2012-02-01 NOTE — Telephone Encounter (Signed)
PCS form faxed to [phone].  Ileana Ladd

## 2012-02-01 NOTE — Telephone Encounter (Signed)
Form signed and given to Lupita Leash.

## 2012-02-08 ENCOUNTER — Ambulatory Visit (INDEPENDENT_AMBULATORY_CARE_PROVIDER_SITE_OTHER): Payer: PRIVATE HEALTH INSURANCE | Admitting: Family Medicine

## 2012-02-08 ENCOUNTER — Encounter: Payer: Self-pay | Admitting: Family Medicine

## 2012-02-08 VITALS — BP 125/81 | HR 92 | Temp 98.4°F | Ht 72.0 in | Wt 186.1 lb

## 2012-02-08 DIAGNOSIS — M25819 Other specified joint disorders, unspecified shoulder: Secondary | ICD-10-CM

## 2012-02-08 DIAGNOSIS — M25511 Pain in right shoulder: Secondary | ICD-10-CM

## 2012-02-08 DIAGNOSIS — M7541 Impingement syndrome of right shoulder: Secondary | ICD-10-CM

## 2012-02-08 DIAGNOSIS — C61 Malignant neoplasm of prostate: Secondary | ICD-10-CM

## 2012-02-08 DIAGNOSIS — M25519 Pain in unspecified shoulder: Secondary | ICD-10-CM

## 2012-02-08 MED ORDER — OXYCODONE HCL 10 MG PO TABS: 10.0000 mg | ORAL_TABLET | Freq: Three times a day (TID) | ORAL | Status: DC | PRN

## 2012-02-08 MED ORDER — HYDROCODONE-ACETAMINOPHEN 5-500 MG PO TABS: 1.0000 | ORAL_TABLET | Freq: Three times a day (TID) | ORAL | Status: DC | PRN

## 2012-02-08 MED ORDER — NITROGLYCERIN 0.1 MG/HR TD PT24: MEDICATED_PATCH | TRANSDERMAL | Status: DC

## 2012-02-08 NOTE — Progress Notes (Signed)
  Subjective:    Patient ID: Joel Donaldson, male    DOB: April 22, 1940, 71 y.o.   MRN: [id number]  HPI He presents for follow-up of rights shoulder rotator cuff syndrome/adhesive capsulitis.  He feels like it is improving. It is about 75% better.  He is taking oxycodone 10 bid and ibuprofen 600 daily for the pain. The medications help for about 4-5 hours. He denies constipation, stomach irritation, nausea.   He is starting physical therapy on 12/30.   Regarding how aggressive he wants shoulder managed, he would like his shoulder how it was prior to this injury.   Review of Systems Denies recent weight loss  Allergies, medication, past medical history reviewed.  -History of Prostate cancer s/p seeds placed end of 2013    Objective:   Physical Exam General: NAD  HEENT: NCAT, EOMI  Neuro: Oriented x 3, alert  Cardio: RRR Resp: NI WOB MSK: RIGHT SHOULDER   Inspection: no obvious abnormalities   Sensation: intact   Tender: along anterior humerus   Strength: 4/5 internal and external rotation, abduction (4/5 internal and external rotation on left, 5/5 abduction on left)   ROM: passive abduction 45 deg (previously about 60-90 deg); internal rotation to inferior contralateral scapula (about equivalent on left)    Assessment & Plan:

## 2012-02-08 NOTE — Assessment & Plan Note (Addendum)
His right shoulder remains stiff. He is doing home exercises, however, he probably does not do them regularly due to discomfort and stiffness of shoulder. -He will be starting physical therapy 12/30 -Try NTG 1/4 patch daily. Potential side effects (notably headache) were discussed.  -Increase oxycodone 10 mg from bid to tid; continue ibuprofen as needed  -Check bone scan to rule-out mets -Refer to [facility] for consultation. I wonder if high volume intraarticular steroid injection and ultrasound of right shoulder to evaluate anatomy would be appropriate? -We discussed different ways of managing shoulder besides the above, notably, whether he should be referred to surgeon to see if manipulating shoulder would be appropriate. We will refer to [facility] first and then follow-up in 4 weeks to discuss options.

## 2012-02-08 NOTE — Patient Instructions (Addendum)
Do physical therapy  Let's check a bone scan of your shoulder to make sure your bones are healthy and there are no signs of cancer there  [facility] in the next 2 weeks for an evaluation  Follow-up with Dr. Madolyn Frieze in 4 weeks

## 2012-02-08 NOTE — Addendum Note (Signed)
Addended by: Madolyn Frieze, Marylene Land J on: 02/08/2012 11:46 AM   Modules accepted: Orders

## 2012-02-12 ENCOUNTER — Ambulatory Visit: Payer: PRIVATE HEALTH INSURANCE | Attending: Family Medicine | Admitting: Physical Therapy

## 2012-02-14 ENCOUNTER — Encounter (HOSPITAL_COMMUNITY)
Admission: RE | Admit: 2012-02-14 | Discharge: 2012-02-14 | Disposition: A | Discharge status: Home / Self Care | Payer: PRIVATE HEALTH INSURANCE | Source: Ambulatory Visit | Attending: Family Medicine | Admitting: Family Medicine

## 2012-02-14 DIAGNOSIS — M25519 Pain in unspecified shoulder: Secondary | ICD-10-CM | POA: Insufficient documentation

## 2012-02-14 DIAGNOSIS — C61 Malignant neoplasm of prostate: Secondary | ICD-10-CM | POA: Insufficient documentation

## 2012-02-14 DIAGNOSIS — M25511 Pain in right shoulder: Secondary | ICD-10-CM

## 2012-02-14 MED ORDER — TECHNETIUM TC 99M MEDRONATE IV KIT
25.0000 | PACK | Freq: Once | INTRAVENOUS | Status: AC | PRN
  Administered 2012-02-14: 25 via INTRAVENOUS

## 2012-02-15 ENCOUNTER — Telehealth: Payer: Self-pay | Admitting: Family Medicine

## 2012-02-15 DIAGNOSIS — M1990 Unspecified osteoarthritis, unspecified site: Secondary | ICD-10-CM

## 2012-02-15 DIAGNOSIS — M7541 Impingement syndrome of right shoulder: Secondary | ICD-10-CM

## 2012-02-15 DIAGNOSIS — M19011 Primary osteoarthritis, right shoulder: Secondary | ICD-10-CM | POA: Insufficient documentation

## 2012-02-15 NOTE — Telephone Encounter (Signed)
Calling to discuss bone scan. Both numbers do not work. No signs of metastatic disease. He has appointment with [facility] 01/08 regarding rotator cuff syndrome/impingement/frozen shoulder.

## 2012-02-15 NOTE — Assessment & Plan Note (Signed)
Calling to discuss bone scan. Both numbers do not work. No signs of metastatic disease. He has appointment with [facility] 01/08 regarding rotator cuff syndrome/impingement/frozen shoulder.  

## 2012-02-20 ENCOUNTER — Ambulatory Visit (INDEPENDENT_AMBULATORY_CARE_PROVIDER_SITE_OTHER): Payer: PRIVATE HEALTH INSURANCE | Admitting: Sports Medicine

## 2012-02-20 ENCOUNTER — Encounter: Payer: Self-pay | Admitting: Sports Medicine

## 2012-02-20 VITALS — BP 126/84 | Ht 70.0 in | Wt 180.0 lb

## 2012-02-20 DIAGNOSIS — M19019 Primary osteoarthritis, unspecified shoulder: Secondary | ICD-10-CM

## 2012-02-20 DIAGNOSIS — M25519 Pain in unspecified shoulder: Secondary | ICD-10-CM

## 2012-02-20 DIAGNOSIS — M12819 Other specific arthropathies, not elsewhere classified, unspecified shoulder: Secondary | ICD-10-CM

## 2012-02-20 NOTE — Progress Notes (Signed)
  Subjective:    Patient ID: Joel Donaldson, male    DOB: 1940-07-23, 72 y.o.   MRN: [id number]  HPI chief complaint: Right shoulder pain  Very pleasant 72 year old right-hand-dominant male comes in today complaining of 1 year of right shoulder pain. He denies any recent or remote trauma to the shoulder. He describes a gradual onset of diffuse shoulder pain it is also accompanied with decreased range of motion and weakness. He saw his primary care physician who placed him into physical therapy and placed him on medication including oxycodone. He is also recently had a cortisone injection into the shoulder done by his PCP. Despite all of this treatment, his pain persists. His pain is worse with activity and diffuse in nature. It improves with rest. Radiates at time down the arm to the elbow. No associated numbness or tingling. No prior shoulder surgery. Mild pain in the left shoulder but not as severe as in the right. He has recently had x-rays of his right shoulder which are available for review.  Past medical history and current medications are all reviewed Medical history is significant hyperlipidemia, hypertension, COPD, reflux disease, prostate cancer He is allergic to tramadol Socially he is a former smoker, drinks 8.4 ounces of alcohol per week, he is retired    Review of Systems     Objective:   Physical Exam Well-developed, well-nourished. No acute distress. Awake alert and oriented x3  Right shoulder: Active forward flexion and abduction to about 90. Passively I'm able to carry him to about 110. Full passive and active internal and external rotation.3-4/5 strength with resisted supraspinatus. 4/5 strength with resisted external rotation. 5/5 strength with resisted internal rotation. No tenderness over the a.c. joint or at the bicipital groove. No Popeye deformity. Markedly positive empty can and markedly positive Hawkins. There is atrophy of both the supraspinatus and infraspinatus  muscle bellies. Neurovascularly intact distally.  Left shoulder: Full active and passive range of motion. Rotator cuff strength is 5/5. Positive empty can. He has atrophy of both supraspinatus and infraspinatus muscle bellies. Neurovascular intact distally.  MSK ultrasound of the right shoulder: There is significant atrophy and retraction of both the supraspinatus and infraspinatus tendons. Subscapularis appears to be intact but there is significant atrophy.  X-rays of the right shoulder done 01/31/2012 are reviewed. Diffuse osteopenia. High riding humeral head consistent with chronic rotator cuff disease. Nothing acute.       Assessment & Plan:  1. Right shoulder pain secondary to rotator cuff arthropathy  I recommended that we try another cortisone injection. Unfortunately his treatment options are limited. He is not a good surgical candidate and success of surgery is very low in this patient anyway. For this reason, I do think it is reasonable for him to continue with when necessary oxycodone. He does seem to help his pain and he is using responsibly. He'll discuss this further with his PCP. Followup in 4 weeks for recheck.  Consent obtained and verified. Time-out conducted. Noted no overlying erythema, induration, or other signs of local infection. Skin prepped in a sterile fashion. Topical analgesic spray: Ethyl chloride. Joint: Right subacromial space Needle: 25g 1/12 inch needle Completed without difficulty. Meds: 3cc 0.5% marcaine, 1cc depomedrol (40mg )  Advised to call if fevers/chills, erythema, induration, drainage, or persistent bleeding.

## 2012-02-27 ENCOUNTER — Telehealth: Payer: Self-pay | Admitting: Family Medicine

## 2012-02-27 NOTE — Telephone Encounter (Signed)
Patient is calling because the form for Home Care was sent back but there were some things that still needed to be completed and he would like to speak to Dr. Madolyn Frieze about this.

## 2012-02-28 NOTE — Telephone Encounter (Signed)
Please send him my apologies but please recommend that he make an appointment to have the form filled so I fill it out properly.

## 2012-02-28 NOTE — Telephone Encounter (Signed)
Pt informed and agreeable.  He says that form was to be sent back to you, told him I would let you know and we would fill it out at his appt on Wednesday. Ruie Sendejo, Maryjo Rochester

## 2012-03-05 ENCOUNTER — Ambulatory Visit (INDEPENDENT_AMBULATORY_CARE_PROVIDER_SITE_OTHER): Payer: PRIVATE HEALTH INSURANCE | Admitting: Family Medicine

## 2012-03-05 ENCOUNTER — Encounter: Payer: Self-pay | Admitting: Family Medicine

## 2012-03-05 ENCOUNTER — Telehealth: Payer: Self-pay | Admitting: Family Medicine

## 2012-03-05 VITALS — BP 128/83 | HR 99 | Temp 98.7°F | Ht 70.0 in | Wt 183.0 lb

## 2012-03-05 DIAGNOSIS — M719 Bursopathy, unspecified: Secondary | ICD-10-CM

## 2012-03-05 DIAGNOSIS — M67919 Unspecified disorder of synovium and tendon, unspecified shoulder: Secondary | ICD-10-CM

## 2012-03-05 MED ORDER — OXYCODONE HCL 10 MG PO TABS: 10.0000 mg | ORAL_TABLET | Freq: Two times a day (BID) | ORAL | Status: DC

## 2012-03-05 NOTE — Telephone Encounter (Signed)
Closed at 4:30 pm. Will try again later.

## 2012-03-05 NOTE — Progress Notes (Signed)
  Subjective:    Patient ID: SHASHANK KWASNIK, male    DOB: 28-Jun-1940, 72 y.o.   MRN: [id number]  HPI He is here to get forms filled out for home health He thought they faxed it to me but I did not receive the re-faxed notes  He says he needs home health aide to help him around the house cooking and cleaning. He lives alone and does not have family nearby. He has difficulty reaching due to his chronic rotator cuff tendinopathy on his right side. He does not drive and has someone who drives him to his appointment and for grocery shopping.   He feels like right shoulder is stable. It is not getting worse or better. The oxycodone twice a day helps him to do things around the house and to do his exercises to help stretch out his shoulder. ROS: he denies constipation or excessive sedation  Review of Systems Per HPI  Allergies, medication, past medical history reviewed.  Smoking status noted.     Objective:   Physical Exam GEN: NAD, elderly RIGHT SHOULDER:   ROM able to abduct and flex about 60-70 deg passively    Assessment & Plan:

## 2012-03-05 NOTE — Assessment & Plan Note (Signed)
Unchanged. His ROM remains limited.  He was seen by Dr. Margaretha Sheffield at [facility] earlier this month who recommends medication management (pain management) due to patient's being a poor surgical candidate. He follows-up with Dr. Margaretha Sheffield early February.  New Rx for oxycodone given. We will continue 10 mg bid. He has about 10 days left of prior prescription, so we will start filling chronic narcotics beginning of the month and patient advised he will need medication refill appointments a week before running out of medications. Our goal is to not allow his condition to worsen. He was encouraged to continue shoulder exercises. He will bring sheet next time with him so we can go over together.  I asked him to call home health and have them get in touch with me and let me know what information I need to provide to approve home health aide, which I think would benefit him at home.  Follow-up with me in 1 month.

## 2012-03-05 NOTE — Patient Instructions (Addendum)
Prescription for pain medication. I will re-fill for beginning of every month so you need to come in a week before you run out.   Follow-up with me the last week of February.   Have that lady for home health call me and let me know what I forgot to put down. Our clinic number is [phone]. Have her leave this message for Dr. Madolyn Frieze.

## 2012-03-05 NOTE — Telephone Encounter (Signed)
Pt called and [facility] is asking for Dr Madolyn Frieze to call them - [phone]

## 2012-03-07 NOTE — Telephone Encounter (Signed)
Spoke with [facility] agency.  Declared patient medically stable, however, he has several debilitating chronic medical problems.   HH said that they would send information to be processed for potential approval for home health aide.    ADMIN: Please notify patient I spoke with home health and that all information has been sent in and they will be processing his application for home health aide.

## 2012-03-19 ENCOUNTER — Ambulatory Visit (INDEPENDENT_AMBULATORY_CARE_PROVIDER_SITE_OTHER): Payer: PRIVATE HEALTH INSURANCE | Admitting: Sports Medicine

## 2012-03-19 ENCOUNTER — Encounter: Payer: Self-pay | Admitting: Sports Medicine

## 2012-03-19 VITALS — BP 149/89 | HR 85 | Ht 70.0 in | Wt 183.0 lb

## 2012-03-19 DIAGNOSIS — M719 Bursopathy, unspecified: Secondary | ICD-10-CM

## 2012-03-19 DIAGNOSIS — M67919 Unspecified disorder of synovium and tendon, unspecified shoulder: Secondary | ICD-10-CM

## 2012-03-19 NOTE — Patient Instructions (Addendum)
Very nice to meet you. I when she to incorporate the stretches and exercises I gave you today. I think I would like to see you again in 2 months and we can repeat the corticosteroid injection if necessary. Try Tylenol 350 mg 3 times daily to help with her arthritis pain. See you again in 2 months.

## 2012-03-19 NOTE — Progress Notes (Signed)
Chief complaint: Right shoulder pain  This is a 72 year old right-hand-dominant male who comes in today after being diagnosed with a chronic rotator cuff tear. Patient states that he is doing relatively well after his corticosteroid injection one month ago. Patient continues to take the oxycodone medication as given to him by his primary care provider. Patient still states that he does have pain at all times that is chronic but it seems to be relatively plateaued compared to what was doing worsening previously. Patient states that the radiation that he was having on the arm has improved as well. Patient denies any numbness or tingling.  Review of systems: 14 system review is done and unremarkable as related to the orthopedic problem.  Physical exam Blood pressure 149/89, pulse 85, height 5\' 10"  (1.778 m), weight 183 lb (83.008 kg). General: No apparent distress alert and oriented x3. Patient does have resting tremors of the upper tremor bilaterally with mild masked facies. Right shoulder exam: Patient has active forward flexion to approximately 90 and active abduction to approximately 75. Passively patient can go before flexion to 110 but has significant crepitus. Patient has 3/5 rotator cuff strength. Patient has significant positive impingement signs. Is neurovascularly intact distally.

## 2012-03-19 NOTE — Assessment & Plan Note (Addendum)
Patient has a chronic rotator cuff tear that is giving him severe osteoarthritic changes into the right shoulder. Patient given theraband today and given home exercises to try to keep with strength he has left intact. Patient also told he can continue the oxycodone from his primary care provider but to be careful secondary to him being a fall risk. Patient will have Tylenol 350 mg 3 times daily. Patient will return in 2 months for further followup. At that time if he continues to have pain we can repeat corticosteroid injection.

## 2012-04-08 ENCOUNTER — Other Ambulatory Visit: Payer: Self-pay | Admitting: *Deleted

## 2012-04-08 ENCOUNTER — Encounter: Payer: Self-pay | Admitting: Family Medicine

## 2012-04-08 ENCOUNTER — Ambulatory Visit (INDEPENDENT_AMBULATORY_CARE_PROVIDER_SITE_OTHER): Payer: PRIVATE HEALTH INSURANCE | Admitting: Family Medicine

## 2012-04-08 VITALS — BP 120/77 | HR 90 | Temp 98.5°F | Ht 70.0 in | Wt 181.7 lb

## 2012-04-08 DIAGNOSIS — J449 Chronic obstructive pulmonary disease, unspecified: Secondary | ICD-10-CM

## 2012-04-08 MED ORDER — ALBUTEROL SULFATE HFA 108 (90 BASE) MCG/ACT IN AERS: 2.0000 | INHALATION_SPRAY | Freq: Four times a day (QID) | RESPIRATORY_TRACT | Status: DC | PRN

## 2012-04-08 MED ORDER — OXYCODONE HCL 10 MG PO TABS: 10.0000 mg | ORAL_TABLET | Freq: Two times a day (BID) | ORAL | Status: DC

## 2012-04-08 NOTE — Assessment & Plan Note (Signed)
Documentation only. He will follow-up with urology end of this month.

## 2012-04-08 NOTE — Assessment & Plan Note (Signed)
It seems subjectively and objectively improved. Exercises seem to be helping strength and function. Continue oxycodone prn for another month; 60 tablets given. We discussed how we will start to taper after that time, and patient seemed amenable. He was advised to take Tylenol not just once but three times daily to prevent pain. He was encouraged to continue exercises.

## 2012-04-08 NOTE — Assessment & Plan Note (Signed)
Controlled with Prilosec, poorly controlled without.

## 2012-04-08 NOTE — Progress Notes (Signed)
  Subjective:    Patient ID: Joel Donaldson, male    DOB: 11/25/1940, 72 y.o.   MRN: [id number]  HPI # Right shoulder rotator cuff tendinopathy His shoulder pains a bit but he is making it. He is taking 2-3 tablets of oxycodone a day. It lasts him about 6 hours. He does exercises 3-4 times day given to him at [facility]  ROS: denies constipation; endorses loose bowel movements  # Prostate cancer, s/p seed implants Next appointment 02/28 He endorses difficulty urinating sometimes, maintaining stream; he also has frequency sometimes Overall, he feels like he is improving.   # Hypertension Compliant with medication ROS: chest pain, dyspnea, dizziness  # GERD He says he notices significant symptoms without Prilosec  Review of Systems Per HPI  Allergies, medication, past medical history reviewed.  Smoking status noted.     Objective:   Physical Exam GEN: NAD PSYCH: appropriate to questions NEURO: baseline resting/intention tremor in hands bilaterally (unchanged from prior) MSK: RIGHT SHOULDER   ROM: abduction 80 deg passively (70 deg actively), flexion 90 deg passively (80 deg actively)   4/5 internal rotation (5/5 left), 3-4/5 supraspinatus/empty/full can test (4/5 left)     Assessment & Plan:

## 2012-04-08 NOTE — Patient Instructions (Signed)
Take Tylenol 650 three times a day You may take oxycodone up to two twice a day   Follow-up in 1 month before you run out of oxycodone

## 2012-04-08 NOTE — Assessment & Plan Note (Signed)
Controlled.  Continue current medications.

## 2012-05-07 ENCOUNTER — Ambulatory Visit (INDEPENDENT_AMBULATORY_CARE_PROVIDER_SITE_OTHER): Payer: PRIVATE HEALTH INSURANCE | Admitting: Family Medicine

## 2012-05-07 ENCOUNTER — Encounter: Payer: Self-pay | Admitting: Family Medicine

## 2012-05-07 VITALS — BP 122/84 | HR 102 | Temp 98.3°F | Ht 70.0 in | Wt 178.4 lb

## 2012-05-07 DIAGNOSIS — M7581 Other shoulder lesions, right shoulder: Secondary | ICD-10-CM

## 2012-05-07 DIAGNOSIS — M719 Bursopathy, unspecified: Secondary | ICD-10-CM

## 2012-05-07 DIAGNOSIS — M67911 Unspecified disorder of synovium and tendon, right shoulder: Secondary | ICD-10-CM

## 2012-05-07 MED ORDER — OXYCODONE-ACETAMINOPHEN 10-325 MG PO TABS: ORAL_TABLET | ORAL | Status: DC

## 2012-05-07 NOTE — Assessment & Plan Note (Signed)
His ROM if more limited than prior. -Formal PT referral made -Regarding pain medications, he says he warrants more than current bid oxycodone. I will allow for bid to tid (80 tablets this month) but I expressed my concern again regarding dependence on this medication. His discomfort seems real, but I do not see an end point on this narcotic with his deteriorating symptoms. We will see how he does after physical therapy. Follow-up in 2 weeks to re-assess. He was advised to continue NTG patches. He has been using 1/2 patch daily. He has follow-up at [facility] in a week or so.

## 2012-05-07 NOTE — Progress Notes (Signed)
  Subjective:    Patient ID: Joel Donaldson, male    DOB: October 13, 1940, 72 y.o.   MRN: [id number]  HPI # Right shoulder rotator cuff syndrome  He feels like his shoulder got better but then it got worse a few weeks ago due to his doing physical therapy at home.  He has been taking pain medication three times a day; twice in the day and once at night to help him sleep.  He has been doing exercises at home.  He is taking Tylenol three-four times a day.    It does not provide relief.  Exercises he is doing: abduction, internal/external rotation exercises; 10 daily with Theraband He ran out of his pain medications a week ago.   Using NTG 1/2 patch daily. Denies dizziness, nausea.   Review of Systems Per HPI Denies constipation, hand/arm numbness/paresthesias  Allergies, medication, past medical history reviewed.  Smoking status noted.     Objective:   Physical Exam GEN: appears uncomfortable PSYCH: appropriate to questions; alert and oriented, awake MSK: RIGHT SHOULDER   Appearance normal   Tenderness: mild anterior shoulder   ROM: abduction 70 deg actively; flexion 70-80 deg and actively      Assessment & Plan:

## 2012-05-07 NOTE — Patient Instructions (Addendum)
Follow-up in 2 weeks to talk about your knees and to see how your shoulder is doing   80 tablets of percocet this month. Twice daily, except on physical therapy days, you may take an extra one.   If you don't hear from physical therapist in a week, please call me and let me know.

## 2012-05-08 ENCOUNTER — Encounter: Payer: Self-pay | Admitting: Home Health Services

## 2012-05-08 LAB — DRUG SCREEN, URINE
Amphetamine Screen, Ur: NEGATIVE
Barbiturate Quant, Ur: NEGATIVE
Benzodiazepines.: NEGATIVE
Cocaine Metabolites: NEGATIVE
Creatinine,U: 99.72 mg/dL
Methadone: NEGATIVE

## 2012-05-15 ENCOUNTER — Ambulatory Visit (INDEPENDENT_AMBULATORY_CARE_PROVIDER_SITE_OTHER): Payer: PRIVATE HEALTH INSURANCE | Admitting: Sports Medicine

## 2012-05-15 ENCOUNTER — Other Ambulatory Visit: Payer: Self-pay | Admitting: *Deleted

## 2012-05-15 VITALS — BP 135/86 | Ht 70.0 in | Wt 180.0 lb

## 2012-05-15 DIAGNOSIS — M67911 Unspecified disorder of synovium and tendon, right shoulder: Secondary | ICD-10-CM

## 2012-05-15 DIAGNOSIS — M67919 Unspecified disorder of synovium and tendon, unspecified shoulder: Secondary | ICD-10-CM

## 2012-05-15 MED ORDER — OMEPRAZOLE 40 MG PO CPDR: 40.0000 mg | DELAYED_RELEASE_CAPSULE | Freq: Every morning | ORAL | Status: DC

## 2012-05-15 NOTE — Assessment & Plan Note (Signed)
Patient has complete chronic rotator cuff tear with high riding humerus with moderate to severe degenerative changes.  He does not want to discuss surgery at this juncture therefore the only treatment modality is continued passive range of motion exercises intermittent subacromial injection (will access the glenohumeral space due to torn rotator cuff) and pain management per primary care provider. Followup at [facility] as needed

## 2012-05-15 NOTE — Patient Instructions (Addendum)
Thank you for coming in today. Come back as needed for interment injections.  Use pain medicines a needed.  There are surgeries that may help.

## 2012-05-15 NOTE — Progress Notes (Signed)
Joel Donaldson is a 72 y.o. male who presents to [facility] today for followup right shoulder pain. Patient was last seen on early February for right shoulder pain. He was found to have chronic rotator cuff repair with moderate DJD of the right shoulder.  He had a Court assured injection which worked for several weeks. He takes Percocet for pain. Additionally he has been doing home exercise program which is somewhat helpful. He notes continued moderate right shoulder pain worse with activity and better with rest. He does not want to consider surgery at this time. No neck pain radiating pain or numbness.    PMH reviewed. COPD History  Substance Use Topics  . Smoking status: Former Smoker -- 2.00 packs/day for 50 years    Types: Cigarettes    Quit date: 10/13/2004  . Smokeless tobacco: Never Used     Comment: Still not smoking.   . Alcohol Use: 8.4 oz/week    14 Glasses of wine per week     Comment: occassional   ROS as above otherwise neg   Exam:  BP 135/86  Ht 5\' 10"  (1.778 m)  Wt 180 lb (81.647 kg)  BMI 25.83 kg/m2 Gen: Well NAD MSK: Right shoulder exam: Patient has active forward flexion to approximately 90 and active abduction to approximately 75. Passively patient can go before flexion to 110 but has significant crepitus. Patient has 3/5 rotator cuff strength. Patient has significant positive impingement signs. Is neurovascularly intact distally.  Subacromial Injection Right Shoulder:  Consent obtained and time out performed.  Area cleaned with alcohol.  40Mg  of depomedrol and 4ml of 0.5% marcaine was injected into the subacromial bursa without complication or bleeding. Patient tolerated the procedure well.   Xray 01/2012 RIGHT SHOULDER - 2+ VIEW  Comparison: None.  Findings: The humeral head is high-riding consistent with a chronic  underlying rotator cuff injury. No acute fracture or dislocation  is noted. The underlying rib cage is within normal limits.  IMPRESSION:  Changes  consistent with chronic rotator cuff injury with a high-  riding humerus. No acute abnormality is noted.  Original Report Authenticated By: Alcide Clever, M.D.

## 2012-05-21 ENCOUNTER — Ambulatory Visit: Payer: PRIVATE HEALTH INSURANCE | Admitting: Family Medicine

## 2012-05-22 ENCOUNTER — Ambulatory Visit: Payer: PRIVATE HEALTH INSURANCE | Attending: Family Medicine | Admitting: Rehabilitation

## 2012-05-27 ENCOUNTER — Observation Stay (HOSPITAL_COMMUNITY)
Admission: EM | Admit: 2012-05-27 | Discharge: 2012-05-28 | Disposition: A | Discharge status: Home / Self Care | Payer: PRIVATE HEALTH INSURANCE | Attending: Family Medicine | Admitting: Family Medicine

## 2012-05-27 ENCOUNTER — Emergency Department (HOSPITAL_COMMUNITY): Payer: PRIVATE HEALTH INSURANCE

## 2012-05-27 ENCOUNTER — Encounter (HOSPITAL_COMMUNITY): Payer: Self-pay | Admitting: *Deleted

## 2012-05-27 DIAGNOSIS — Z7289 Other problems related to lifestyle: Secondary | ICD-10-CM

## 2012-05-27 DIAGNOSIS — I498 Other specified cardiac arrhythmias: Secondary | ICD-10-CM | POA: Insufficient documentation

## 2012-05-27 DIAGNOSIS — M1990 Unspecified osteoarthritis, unspecified site: Secondary | ICD-10-CM | POA: Insufficient documentation

## 2012-05-27 DIAGNOSIS — J42 Unspecified chronic bronchitis: Secondary | ICD-10-CM

## 2012-05-27 DIAGNOSIS — I10 Essential (primary) hypertension: Secondary | ICD-10-CM

## 2012-05-27 DIAGNOSIS — R0602 Shortness of breath: Secondary | ICD-10-CM | POA: Insufficient documentation

## 2012-05-27 DIAGNOSIS — F1090 Alcohol use, unspecified, uncomplicated: Secondary | ICD-10-CM

## 2012-05-27 DIAGNOSIS — J449 Chronic obstructive pulmonary disease, unspecified: Secondary | ICD-10-CM

## 2012-05-27 DIAGNOSIS — E785 Hyperlipidemia, unspecified: Secondary | ICD-10-CM

## 2012-05-27 DIAGNOSIS — J4489 Other specified chronic obstructive pulmonary disease: Secondary | ICD-10-CM

## 2012-05-27 DIAGNOSIS — K219 Gastro-esophageal reflux disease without esophagitis: Secondary | ICD-10-CM

## 2012-05-27 DIAGNOSIS — R079 Chest pain, unspecified: Principal | ICD-10-CM

## 2012-05-27 LAB — POCT I-STAT TROPONIN I: Troponin i, poc: 0 ng/mL (ref 0.00–0.08)

## 2012-05-27 LAB — CBC
MCH: 28.3 pg (ref 26.0–34.0)
Platelets: 201 10*3/uL (ref 150–400)
RBC: 4.66 MIL/uL (ref 4.22–5.81)
WBC: 3.5 10*3/uL — ABNORMAL LOW (ref 4.0–10.5)

## 2012-05-27 LAB — TROPONIN I
Troponin I: <0.3 ng/mL (ref <0.30)
Troponin I: <0.3 ng/mL (ref <0.30)

## 2012-05-27 LAB — CREATININE, SERUM: GFR calc Af Amer: >90 mL/min (ref >90)

## 2012-05-27 LAB — BASIC METABOLIC PANEL
CO2: 29 mEq/L (ref 19–32)
Calcium: 9 mg/dL (ref 8.4–10.5)
Sodium: 138 mEq/L (ref 135–145)

## 2012-05-27 MED ORDER — OXYCODONE-ACETAMINOPHEN 5-325 MG PO TABS
1.0000 | ORAL_TABLET | Freq: Three times a day (TID) | ORAL | Status: DC | PRN
  Administered 2012-05-27: 1 via ORAL
  Filled 2012-05-27: qty 1

## 2012-05-27 MED ORDER — ASPIRIN 81 MG PO TBEC: 81.0000 mg | DELAYED_RELEASE_TABLET | Freq: Every day | ORAL | Status: DC

## 2012-05-27 MED ORDER — PANTOPRAZOLE SODIUM 40 MG PO TBEC
80.0000 mg | DELAYED_RELEASE_TABLET | Freq: Every day | ORAL | Status: DC
  Administered 2012-05-27 – 2012-05-28 (×2): 80 mg via ORAL
  Filled 2012-05-27 (×2): qty 2

## 2012-05-27 MED ORDER — LOTEPREDNOL ETABONATE 0.5 % OP SUSP
2.0000 [drp] | Freq: Two times a day (BID) | OPHTHALMIC | Status: DC
  Administered 2012-05-27 – 2012-05-28 (×2): 2 [drp] via OPHTHALMIC
  Filled 2012-05-27: qty 5

## 2012-05-27 MED ORDER — ASPIRIN EC 81 MG PO TBEC
81.0000 mg | DELAYED_RELEASE_TABLET | Freq: Every day | ORAL | Status: DC
  Administered 2012-05-27 – 2012-05-28 (×2): 81 mg via ORAL
  Filled 2012-05-27 (×2): qty 1

## 2012-05-27 MED ORDER — LISINOPRIL-HYDROCHLOROTHIAZIDE 20-12.5 MG PO TABS: 1.0000 | ORAL_TABLET | Freq: Every day | ORAL | Status: DC

## 2012-05-27 MED ORDER — OXYCODONE HCL 5 MG PO TABS: 5.0000 mg | ORAL_TABLET | Freq: Three times a day (TID) | ORAL | Status: DC | PRN

## 2012-05-27 MED ORDER — MORPHINE SULFATE 4 MG/ML IJ SOLN
2.0000 mg | Freq: Once | INTRAMUSCULAR | Status: AC
  Administered 2012-05-27: 2 mg via INTRAVENOUS
  Filled 2012-05-27: qty 1

## 2012-05-27 MED ORDER — LISINOPRIL 20 MG PO TABS
20.0000 mg | ORAL_TABLET | Freq: Every day | ORAL | Status: DC
  Administered 2012-05-28: 20 mg via ORAL
  Filled 2012-05-27 (×2): qty 1

## 2012-05-27 MED ORDER — ONDANSETRON HCL 4 MG/2ML IJ SOLN
4.0000 mg | Freq: Once | INTRAMUSCULAR | Status: AC
  Administered 2012-05-27: 4 mg via INTRAVENOUS
  Filled 2012-05-27: qty 2

## 2012-05-27 MED ORDER — OXYCODONE-ACETAMINOPHEN 10-325 MG PO TABS: 1.0000 | ORAL_TABLET | Freq: Three times a day (TID) | ORAL | Status: DC | PRN

## 2012-05-27 MED ORDER — DIPHENHYDRAMINE-ZINC ACETATE 2-0.1 % EX CREA
TOPICAL_CREAM | Freq: Three times a day (TID) | CUTANEOUS | Status: DC | PRN
  Administered 2012-05-28: via TOPICAL
  Filled 2012-05-27 (×2): qty 28

## 2012-05-27 MED ORDER — HEPARIN SODIUM (PORCINE) 5000 UNIT/ML IJ SOLN
5000.0000 [IU] | Freq: Three times a day (TID) | INTRAMUSCULAR | Status: DC
  Administered 2012-05-27 (×2): 5000 [IU] via SUBCUTANEOUS
  Filled 2012-05-27 (×6): qty 1

## 2012-05-27 MED ORDER — SODIUM CHLORIDE 0.9 % IJ SOLN
3.0000 mL | Freq: Two times a day (BID) | INTRAMUSCULAR | Status: DC
  Administered 2012-05-27: 3 mL via INTRAVENOUS

## 2012-05-27 MED ORDER — ALBUTEROL SULFATE HFA 108 (90 BASE) MCG/ACT IN AERS: 2.0000 | INHALATION_SPRAY | Freq: Four times a day (QID) | RESPIRATORY_TRACT | Status: DC | PRN

## 2012-05-27 MED ORDER — HYDROCHLOROTHIAZIDE 12.5 MG PO CAPS
12.5000 mg | ORAL_CAPSULE | Freq: Every day | ORAL | Status: DC
  Administered 2012-05-28: 12.5 mg via ORAL
  Filled 2012-05-27 (×2): qty 1

## 2012-05-27 MED ORDER — NITROGLYCERIN 0.4 MG SL SUBL
0.4000 mg | SUBLINGUAL_TABLET | SUBLINGUAL | Status: DC | PRN
  Administered 2012-05-27: 0.4 mg via SUBLINGUAL
  Filled 2012-05-27: qty 25

## 2012-05-27 MED ORDER — LOTEPREDNOL ETABONATE 0.2 % OP SUSP: 2.0000 [drp] | Freq: Two times a day (BID) | OPHTHALMIC | Status: DC

## 2012-05-27 NOTE — H&P (Signed)
Family Medicine Teaching [facility] Admission History and Physical Service Pager: [phone]  Patient name: Joel Donaldson Medical record number: [id number] Date of birth: 1940-05-21 Age: 72 y.o. Gender: male  Primary Care Provider: [facility], Marylene Land, MD  Chief Complaint: chest pain  History of Present Illness: Joel Donaldson is a 72 y.o. year old male with a PMH of HTN, hyperlipidemia, COPD, GERD, OA, and prostate cancer presenting with chest pain.  Patient reports acute onset of sudden, constant, dull, left-sided chest pain with some radiation to the left arm around 6:00 this morning.  States that he had just finished taking a shower when the pain began.  Associated symptoms at the time of onset include some numbness/tingling in the left hand and some intermittent dizziness.  He took one of his own nitroglycerin tablets at home with minimal pain relief.  No changes in chest pain with exertion or rest.  A short time thereafter, he called EMS; he received 2 tablets nitroglycerin and 324 mg aspirin en route to the ED.  Currently, he notes only some vague, left-sided chest discomfort.  Endorses a period of feeling short of breath last night that is not apparent today.  Denies headache, visual disturbances, syncope, palpitations, nausea/vomiting, abdominal pain, or diaphoresis.  No history of MI or PE/DVT.  Patient states that he is followed by Dr. Katrinka Blazing at [facility] and that his most recent appointment was last year.  Patient Active Problem List  Diagnosis  . HYPERLIPIDEMIA  . INTENTION TREMOR  . MIGRAINE, UNSPEC., W/O INTRACTABLE MIGRAINE  . HYPERTENSION, BENIGN SYSTEMIC  . COPD  . GASTROESOPHAGEAL REFLUX, NO ESOPHAGITIS  . Prostate cancer  . Alcohol use  . Osteoarthritis of both knees  . Preventative health care  . Tendinopathy of rotator cuff  . Degenerative joint disease   Past Medical History: Past Medical History  Diagnosis Date  . Arthritis     hands, back, knees  .  Anxiety   . Depression   . GERD (gastroesophageal reflux disease)   . Glaucoma   . Hypertension   . Prostate cancer   . Heart murmur asymptomatic  . Short of breath on exertion   . Asthma   . Nocturia   . Hx of radioisotope therapy 07/26/11    Radiosactive Prostate Seed Implant/ I-125 Seeds  . Chest pain at rest 12/19/2011   Past Surgical History: Past Surgical History  Procedure Laterality Date  . Shoulder surgery  YRS AGO    LEFT  . Lumbar fusion  1995  . Radioactive seed implant  09/14/2011    Procedure: RADIOACTIVE SEED IMPLANT;  Surgeon: Lindaann Slough, MD;  Location: [facility];  Service: Urology;  Laterality: N/A;  Total number of seeds -    . Cystoscopy  09/14/2011    Procedure: CYSTOSCOPY FLEXIBLE;  Surgeon: Lindaann Slough, MD;  Location: [facility];  Service: Urology;  Laterality: N/A;  No seeds seen in the bladder   Social History: History  Substance Use Topics  . Smoking status: Former Smoker -- 2.00 packs/day for 50 years    Types: Cigarettes    Quit date: 10/13/2004  . Smokeless tobacco: Never Used     Comment: Still not smoking.   . Alcohol Use: 8.4 oz/week    14 Glasses of wine per week     Comment: occassional   For any additional social history documentation, please refer to relevant sections of EMR.  Family History: Family History  Problem Relation Age of  Onset  . Diabetes Mother    Allergies: Allergies  Allergen Reactions  . Tramadol Nausea Only   No current facility-administered medications on file prior to encounter.   Current Outpatient Prescriptions on File Prior to Encounter  Medication Sig Dispense Refill  . ALREX 0.2 % SUSP Place 2 drops into both eyes 2 (two) times daily.       Marland Kitchen aspirin 81 MG EC tablet Take 81 mg by mouth daily.       Marland Kitchen lisinopril-hydrochlorothiazide (PRINZIDE,ZESTORETIC) 20-12.5 MG per tablet Take 1 tablet by mouth daily.  30 tablet  6  . omeprazole (PRILOSEC) 40 MG capsule Take 1  capsule (40 mg total) by mouth every morning.  30 capsule  12   Review Of Systems: Per HPI.  Physical Exam: BP 157/92  Pulse 78  Temp(Src) 98.3 F (36.8 C) (Oral)  Resp 17  SpO2 96% Exam: General: Alert, oriented.  NAD. Cardiovascular: Regular rhythm.  Normal S1 and S2.  No murmurs, rubs, or gallops. Respiratory: Lungs CTAB.  No wheezes.  No respiratory distress.  Normal respiratory effort. Abdomen: Soft, non-tender, non-distended.  Normal bowel sounds. Extremities: No edema.  Intact palpable pulses B/L. Skin: Skin is warm and dry.  No diaphoresis. Neuro: Moves all extremities spontaneously.  No cranial nerve deficits.  Labs and Imaging: CBC BMET   Recent Labs Lab 05/27/12 0947  WBC 3.5*  HGB 13.2  HCT 38.6*  PLT 201    Recent Labs Lab 05/27/12 0947  NA 138  K 4.5  CL 103  CO2 29  BUN 9  CREATININE 0.86  GLUCOSE 118*  CALCIUM 9.0     Troponin (Point of Care Test)  Recent Labs  05/27/12 1009  TROPIPOC 0.00   Dg Chest 2 View  05/27/2012 *RADIOLOGY REPORT* Clinical Data: Left chest pain with shortness of breath. History of asthma and hypertension. CHEST - 2 VIEW Comparison: 12/19/2011 Findings: The heart size and mediastinal contours are stable. There is aortic and brachiocephalic tortuosity. There is central airway thickening without hyperinflation, confluent airspace opacity or edema. There is no significant pleural effusion. The subacromial space of both shoulders is narrowed, suggesting chronic rotator cuff tears. There are mild degenerative changes throughout the spine. IMPRESSION: Central airway thickening suggesting bronchitis or viral infection. No evidence of pneumonia or edema. Original Report Authenticated By: Carey Bullocks, M.D.   EKG Date: 05/27/2012 @ 4782  Rate: 104 bpm  Rhythm: sinus tachycardia  QRS Axis: left  Intervals: normal  ST/T Wave abnormalities: nonspecific ST changes  Conduction Disutrbances:poor R progression  Narrative  Interpretation: Sinus tach + PVC  Old EKG Reviewed: unchanged  Assessment and Plan: Joel Donaldson is a 72 y.o. year old male with a PMH of HTN, hyperlipidemia, COPD, GERD, OA, and prostate cancer presenting with chest pain.  Patient his history of similar hospital admissions for chest pain.  No history of MI, though he does present with significant risk factors for CAD.    1. Chest pain - Patient stable.  Chest pain improving since initial onset this morning.  No reproducible pain on chest palpation.  EKG unchanged from most recent study (12/19/2011).  CXR with no cardiovascular findings. TIMI2 Score 2 = 8.3% death, MI, urgent revascularization.        - Troponin x1 negative.  Complete cycle of cardiac enzymes.       - Repeat EKG tomorrow AM.       - Repeat CBC and BMP tomorrow AM.       -  Cardiac monitoring  - Consult patient's cardiologist, Dr. Garnette Scheuermann at [facility], for records and recommendations   - ASA 81 mg daily    2. HTN       - Continue home BP medications.       - Vital signs per protocol. 3. COPD - Former smoker (2 packs per day x50 years).       - CXR shows central airway thickening suggesting bronchitis.       - No respiratory distress.       - Continue home medications. 4. Hyperlipidemia - Last cholesterol panel in Nov 2013; Based on last panel, age, HTN, patient has > 28% 10-year risk for CVD, so no need to repeat  - Start moderate-high dose statin at discharge  5. H/o prostate cancer - Stable.  No metastases.  Followed by [facility]. 6. OA - knees       - Continue Roxicodone PRN for pain while inpatient.  Avoid NSAIDs.  FEN/GI: NPO for further cardiac work-up. Prophylaxis: GI ppx with Protonix.  DVT ppx with subq Heparin. Disposition: Admit to observation. Code Status: Full code.  Si Raider Clinton Sawyer, MD, MBA 05/27/2012, 9:50 PM Family Medicine Resident, PGY-2 [phone] pager

## 2012-05-27 NOTE — ED Notes (Signed)
Pt returned from radiology, placed back on monitor.  

## 2012-05-27 NOTE — ED Provider Notes (Signed)
History     CSN: [id number]  Arrival date & time 05/27/12  0917   First MD Initiated Contact with Patient 05/27/12 [phone]      Chief Complaint  Patient presents with  . Chest Pain    (Consider location/radiation/quality/duration/timing/severity/associated sxs/prior treatment) HPI Comments: [name] presents via EMS for evaluation of chest discomfort. He reports acute onset of pain shortly after 0600. He took one of his own nitroglycerin tablets at home with only minimal improvement in the discomfort. A short time thereafter he called EMS and was given 2 nitroglycerin tablets and 324 mg of aspirin. He reports only having vague discomfort now. He also had a period of feeling short of breath last night that is not apparent today. She denies any diaphoresis, fevers, caring pain, palpitations, nausea, vomiting, diarrhea, or recent travel/immobilization.  Patient is a 72 y.o. male presenting with chest pain. The history is provided by the patient. No language interpreter was used.  Chest Pain Pain location:  L chest Pain quality: pressure and sharp   Pain quality: not aching, not burning, not crushing, not radiating, not stabbing, not tearing, not throbbing and no tightness   Pain radiates to:  Does not radiate Pain radiates to the back: no   Pain severity:  Moderate Onset quality:  Sudden Duration:  60 minutes Timing:  Constant Progression:  Partially resolved Chronicity:  New Context: at rest   Context: not breathing, no drug use, not eating, not lifting, no movement, not raising an arm, no stress and no trauma   Relieved by:  Aspirin and nitroglycerin Worsened by:  Nothing tried Associated symptoms: no abdominal pain, no anorexia, no back pain, no cough, no diaphoresis, no dizziness, no fatigue, no fever, no headache, no nausea, no near-syncope, no orthopnea, no palpitations, no shortness of breath, no syncope, not vomiting and no weakness   Risk factors: hypertension and male sex    Risk factors: no coronary artery disease, no diabetes mellitus and no smoking     Past Medical History  Diagnosis Date  . Arthritis     hands, back, knees  . Anxiety   . Depression   . GERD (gastroesophageal reflux disease)   . Glaucoma   . Hypertension   . Prostate cancer   . Heart murmur asymptomatic  . Short of breath on exertion   . Asthma   . Nocturia   . Hx of radioisotope therapy 07/26/11    Radiosactive Prostate Seed Implant/ I-125 Seeds  . Chest pain at rest 12/19/2011    Past Surgical History  Procedure Laterality Date  . Shoulder surgery  YRS AGO    LEFT  . Lumbar fusion  1995  . Radioactive seed implant  09/14/2011    Procedure: RADIOACTIVE SEED IMPLANT;  Surgeon: Lindaann Slough, MD;  Location: [facility];  Service: Urology;  Laterality: N/A;  Total number of seeds -    . Cystoscopy  09/14/2011    Procedure: CYSTOSCOPY FLEXIBLE;  Surgeon: Lindaann Slough, MD;  Location: [facility];  Service: Urology;  Laterality: N/A;  No seeds seen in the bladder    Family History  Problem Relation Age of Onset  . Diabetes Mother     History  Substance Use Topics  . Smoking status: Former Smoker -- 2.00 packs/day for 50 years    Types: Cigarettes    Quit date: 10/13/2004  . Smokeless tobacco: Never Used     Comment: Still not smoking.   . Alcohol Use: 8.4  oz/week    14 Glasses of wine per week     Comment: occassional      Review of Systems  Constitutional: Negative for fever, chills, diaphoresis, appetite change and fatigue.  HENT: Negative for congestion, sore throat, rhinorrhea and neck pain.   Eyes: Negative for visual disturbance.  Respiratory: Positive for chest tightness. Negative for cough, shortness of breath and wheezing.   Cardiovascular: Positive for chest pain. Negative for palpitations, orthopnea, leg swelling, syncope and near-syncope.  Gastrointestinal: Negative for nausea, vomiting, abdominal pain, diarrhea,  abdominal distention and anorexia.  Genitourinary: Negative for dysuria.  Musculoskeletal: Negative for myalgias and back pain.  Skin: Negative for rash and wound.  Neurological: Negative for dizziness, syncope, weakness, light-headedness and headaches.  Psychiatric/Behavioral: Negative for confusion.  All other systems reviewed and are negative.    Allergies  Tramadol  Home Medications   Current Outpatient Rx  Name  Route  Sig  Dispense  Refill  . albuterol (PROVENTIL HFA;VENTOLIN HFA) 108 (90 BASE) MCG/ACT inhaler   Inhalation   Inhale 2 puffs into the lungs every 6 (six) hours as needed for wheezing or shortness of breath.         . ALREX 0.2 % SUSP   Both Eyes   Place 2 drops into both eyes 2 (two) times daily.          Marland Kitchen aspirin 81 MG EC tablet   Oral   Take 81 mg by mouth daily.          Marland Kitchen lisinopril-hydrochlorothiazide (PRINZIDE,ZESTORETIC) 20-12.5 MG per tablet   Oral   Take 1 tablet by mouth daily.   30 tablet   6   . nitroGLYCERIN (NITRODUR - DOSED IN MG/24 HR) 0.1 mg/hr   Transdermal   Place onto the skin daily. 1/4 of a patch         . nitroGLYCERIN (NITROSTAT) 0.4 MG SL tablet   Sublingual   Place 0.4 mg under the tongue every 5 (five) minutes as needed for chest pain.         Marland Kitchen omeprazole (PRILOSEC) 40 MG capsule   Oral   Take 1 capsule (40 mg total) by mouth every morning.   30 capsule   12   . oxyCODONE-acetaminophen (PERCOCET) 10-325 MG per tablet   Oral   Take 1 tablet by mouth every 8 (eight) hours as needed for pain.           BP 118/80  Pulse 108  Temp(Src) 98.9 F (37.2 C) (Oral)  Resp 30  SpO2 98%  Physical Exam  Nursing note and vitals reviewed. Constitutional: He is oriented to person, place, and time. He appears well-developed and well-nourished. No distress.  HENT:  Head: Normocephalic and atraumatic.  Right Ear: External ear normal.  Left Ear: External ear normal.  Nose: Nose normal.  Mouth/Throat:  Oropharynx is clear and moist. No oropharyngeal exudate.  Eyes: Conjunctivae are normal. Pupils are equal, round, and reactive to light. Right eye exhibits no discharge. Left eye exhibits no discharge. No scleral icterus.  Neck: Normal range of motion. Neck supple. No JVD present. No tracheal deviation present.  Cardiovascular: Regular rhythm, S1 normal, S2 normal, normal heart sounds, intact distal pulses and normal pulses.   No extrasystoles are present. Tachycardia present.  PMI is not displaced.  Exam reveals no gallop and no decreased pulses.   No murmur heard. Pulmonary/Chest: Effort normal and breath sounds normal. No stridor. No respiratory distress. He has no wheezes. He  has no rales. He exhibits no tenderness.  Abdominal: Soft. Bowel sounds are normal. He exhibits no distension and no mass. There is no tenderness. There is no rebound and no guarding.  Musculoskeletal: Normal range of motion. He exhibits no edema and no tenderness.  Lymphadenopathy:    He has no cervical adenopathy.  Neurological: He is alert and oriented to person, place, and time. No cranial nerve deficit.  Skin: Skin is warm and dry. No rash noted. He is not diaphoretic. No erythema. No pallor.  Psychiatric: He has a normal mood and affect. His behavior is normal.    ED Course  Procedures (including critical care time)  Labs Reviewed  CBC - Abnormal; Notable for the following:    WBC 3.5 (*)    HCT 38.6 (*)    All other components within normal limits  BASIC METABOLIC PANEL  POCT I-STAT TROPONIN I   Dg Chest 2 View  05/27/2012  *RADIOLOGY REPORT*  Clinical Data: Left chest pain with shortness of breath.  History of asthma and hypertension.  CHEST - 2 VIEW  Comparison: 12/19/2011  Findings: The heart size and mediastinal contours are stable. There is aortic and brachiocephalic tortuosity.  There is central airway thickening without hyperinflation, confluent airspace opacity or edema.  There is no significant  pleural effusion.  The subacromial space of both shoulders is narrowed, suggesting chronic rotator cuff tears.  There are mild degenerative changes throughout the spine.  IMPRESSION: Central airway thickening suggesting bronchitis or viral infection. No evidence of pneumonia or edema.   Original Report Authenticated By: Carey Bullocks, M.D.      No diagnosis found.   Date: 05/27/2012 @ 0928  Rate: 104 bpm  Rhythm: sinus tachycardia  QRS Axis: left  Intervals: normal  ST/T Wave abnormalities: nonspecific ST changes  Conduction Disutrbances:poor R progression  Narrative Interpretation:  Sinus tach + PVC  Old EKG Reviewed: unchanged     MDM  Pt presents for evaluation of chest pain.  He appears nontoxic, note mild tachycardia, NAD.  His EKG is unchanged from most recent study with the exception of rate.  Will obtain basic cardiac labs and reassess as results become available.  Pt stable.  Trop negative x1.  Note evidence of chronic bronchitis on CXR without effusion, edema, or infiltrate.  Pt has not been evaluated recently for chest discomfort and has risk factors for CAD.  Plan admit for serial CEs and further evaluation.  Pt by hx appears to be low risk for this pain being related to PE/thromboembolic event.      Tobin Chad, MD 05/27/12 [phone]

## 2012-05-27 NOTE — Progress Notes (Deleted)
Family Medicine Teaching [facility] Admission History and Physical Service Pager: [phone]  Patient name: Joel Donaldson Medical record number: [id number] Date of birth: 12-18-1940 Age: 72 y.o. Gender: male  Primary Care Provider: [facility], Marylene Land, MD  Chief Complaint: chest pain  History of Present Illness: Joel Donaldson is a 72 y.o. year old male with a PMH of HTN, hyperlipidemia, COPD, GERD, OA, and prostate cancer presenting with chest pain.  Patient reports acute onset of sudden, constant, dull, left-sided chest pain with some radiation to the left arm around 6:00 this morning.  States that he had just finished taking a shower when the pain began.  Associated symptoms at the time of onset include some numbness/tingling in the left hand and some intermittent dizziness.  He took one of his own nitroglycerin tablets at home with minimal pain relief.  No changes in chest pain with exertion or rest.  A short time thereafter, he called EMS; he received 2 tablets nitroglycerin and 324 mg aspirin en route to the ED.  Currently, he notes only some vague, left-sided chest discomfort.  Endorses a period of feeling short of breath last night that is not apparent today.  Denies headache, visual disturbances, syncope, palpitations, nausea/vomiting, abdominal pain, or diaphoresis.  No history of MI or PE/DVT.  Patient states that he is followed by Dr. Katrinka Blazing at [facility] and that his most recent appointment was last year.  Patient Active Problem List  Diagnosis  . HYPERLIPIDEMIA  . INTENTION TREMOR  . MIGRAINE, UNSPEC., W/O INTRACTABLE MIGRAINE  . HYPERTENSION, BENIGN SYSTEMIC  . COPD  . GASTROESOPHAGEAL REFLUX, NO ESOPHAGITIS  . Prostate cancer  . Alcohol use  . Osteoarthritis of both knees  . Preventative health care  . Tendinopathy of rotator cuff  . Degenerative joint disease   Past Medical History: Past Medical History  Diagnosis Date  . Arthritis     hands, back, knees  .  Anxiety   . Depression   . GERD (gastroesophageal reflux disease)   . Glaucoma   . Hypertension   . Prostate cancer   . Heart murmur asymptomatic  . Short of breath on exertion   . Asthma   . Nocturia   . Hx of radioisotope therapy 07/26/11    Radiosactive Prostate Seed Implant/ I-125 Seeds  . Chest pain at rest 12/19/2011   Past Surgical History: Past Surgical History  Procedure Laterality Date  . Shoulder surgery  YRS AGO    LEFT  . Lumbar fusion  1995  . Radioactive seed implant  09/14/2011    Procedure: RADIOACTIVE SEED IMPLANT;  Surgeon: Lindaann Slough, MD;  Location: [facility];  Service: Urology;  Laterality: N/A;  Total number of seeds -    . Cystoscopy  09/14/2011    Procedure: CYSTOSCOPY FLEXIBLE;  Surgeon: Lindaann Slough, MD;  Location: [facility];  Service: Urology;  Laterality: N/A;  No seeds seen in the bladder   Social History: History  Substance Use Topics  . Smoking status: Former Smoker -- 2.00 packs/day for 50 years    Types: Cigarettes    Quit date: 10/13/2004  . Smokeless tobacco: Never Used     Comment: Still not smoking.   . Alcohol Use: 8.4 oz/week    14 Glasses of wine per week     Comment: occassional   For any additional social history documentation, please refer to relevant sections of EMR.  Family History: Family History  Problem Relation Age of  Onset  . Diabetes Mother    Allergies: Allergies  Allergen Reactions  . Tramadol Nausea Only   No current facility-administered medications on file prior to encounter.   Current Outpatient Prescriptions on File Prior to Encounter  Medication Sig Dispense Refill  . ALREX 0.2 % SUSP Place 2 drops into both eyes 2 (two) times daily.       Marland Kitchen aspirin 81 MG EC tablet Take 81 mg by mouth daily.       Marland Kitchen lisinopril-hydrochlorothiazide (PRINZIDE,ZESTORETIC) 20-12.5 MG per tablet Take 1 tablet by mouth daily.  30 tablet  6  . omeprazole (PRILOSEC) 40 MG capsule Take 1  capsule (40 mg total) by mouth every morning.  30 capsule  12   Review Of Systems: Per HPI.  Physical Exam: BP 157/92  Pulse 78  Temp(Src) 98.3 F (36.8 C) (Oral)  Resp 17  SpO2 96% Exam: General: Alert, oriented.  NAD. Cardiovascular: Regular rhythm.  Normal S1 and S2.  No murmurs, rubs, or gallops. Respiratory: Lungs CTAB.  No wheezes.  No respiratory distress.  Normal respiratory effort. Abdomen: Soft, non-tender, non-distended.  Normal bowel sounds. Extremities: No edema.  Intact palpable pulses B/L. Skin: Skin is warm and dry.  No diaphoresis. Neuro: Moves all extremities spontaneously.  No cranial nerve deficits.  Labs and Imaging: CBC BMET   Recent Labs Lab 05/27/12 0947  WBC 3.5*  HGB 13.2  HCT 38.6*  PLT 201    Recent Labs Lab 05/27/12 0947  NA 138  K 4.5  CL 103  CO2 29  BUN 9  CREATININE 0.86  GLUCOSE 118*  CALCIUM 9.0     Troponin (Point of Care Test)  Recent Labs  05/27/12 1009  TROPIPOC 0.00   Dg Chest 2 View  05/27/2012 *RADIOLOGY REPORT* Clinical Data: Left chest pain with shortness of breath. History of asthma and hypertension. CHEST - 2 VIEW Comparison: 12/19/2011 Findings: The heart size and mediastinal contours are stable. There is aortic and brachiocephalic tortuosity. There is central airway thickening without hyperinflation, confluent airspace opacity or edema. There is no significant pleural effusion. The subacromial space of both shoulders is narrowed, suggesting chronic rotator cuff tears. There are mild degenerative changes throughout the spine. IMPRESSION: Central airway thickening suggesting bronchitis or viral infection. No evidence of pneumonia or edema. Original Report Authenticated By: Carey Bullocks, M.D.   EKG Date: 05/27/2012 @ 9604  Rate: 104 bpm  Rhythm: sinus tachycardia  QRS Axis: left  Intervals: normal  ST/T Wave abnormalities: nonspecific ST changes  Conduction Disutrbances:poor R progression  Narrative  Interpretation: Sinus tach + PVC  Old EKG Reviewed: unchanged  Assessment and Plan: Joel Donaldson is a 72 y.o. year old male with a PMH of HTN, hyperlipidemia, COPD, GERD, OA, and prostate cancer presenting with chest pain.  Patient his history of similar hospital admissions for chest pain.  No history of MI, though he does present with significant risk factors for CAD.    1. Chest pain - Patient stable.  Chest pain improving since initial onset this morning.  No reproducible pain on chest palpation.  EKG unchanged from most recent study (12/19/2011).  CXR with no cardiovascular findings. TIMI2 Score 2 = 8.3% death, MI, urgent revascularization.        - Troponin x1 negative.  Complete cycle of cardiac enzymes.       - Repeat EKG tomorrow AM.       - Repeat CBC and BMP tomorrow AM.       -  Cardiac monitoring  - Consult patient's cardiologist, Dr. Garnette Scheuermann at [facility], for records and recommendations    2. HTN       - Continue home BP medications.       - Vital signs per protocol. 3. COPD - Former smoker (2 packs per day x50 years).       - CXR shows central airway thickening suggesting bronchitis.       - No respiratory distress.       - Continue home medications. 4. Hyperlipidemia - Last cholesterol panel in Nov 2013; Based on last panel, age, HTN, patient has > 28% 10-year risk for CVD, so no need to repeat  - Start moderate-high dose statin at discharge  5. H/o prostate cancer - Stable.  No metastases.  Followed by [facility]. 6. OA - knees       - Continue Roxicodone PRN for pain while inpatient.  Avoid NSAIDs.  FEN/GI: NPO for further cardiac work-up. Prophylaxis: GI ppx with Protonix.  DVT ppx with subq Heparin. Disposition: Admit to observation. Code Status: Full code.  Si Raider Clinton Sawyer, MD, MBA 05/27/2012, 9:50 PM Family Medicine Resident, PGY-2 [phone] pager

## 2012-05-27 NOTE — ED Notes (Signed)
Pt c/o constant left sided sharp CP that started this morning at 6am. Pt reports he has had similar CP before and told it wasn't his heart and told him he was having acid reflux. Pt in nad, skin warm and dry, resp e/u.

## 2012-05-27 NOTE — ED Notes (Signed)
Pt c/o nausea, continues to deny CP. MD notified.

## 2012-05-27 NOTE — ED Notes (Signed)
Pt arrived by [facility]. Reports onset of cp this am 0600, had episode of sob last night but sob today. NSR on monitor. Took 1 nitro at home with minimal relief. Received ASA 324mg  and 2 nitro by ems, pain down to 3. Iv started pta.

## 2012-05-28 DIAGNOSIS — E785 Hyperlipidemia, unspecified: Secondary | ICD-10-CM

## 2012-05-28 LAB — BASIC METABOLIC PANEL
CO2: 30 mEq/L (ref 19–32)
Calcium: 9.1 mg/dL (ref 8.4–10.5)
Chloride: 101 mEq/L (ref 96–112)
Creatinine, Ser: 1.01 mg/dL (ref 0.50–1.35)
GFR calc Af Amer: 83 mL/min — ABNORMAL LOW (ref >90)
Sodium: 137 mEq/L (ref 135–145)

## 2012-05-28 LAB — CBC
Platelets: 181 10*3/uL (ref 150–400)
RBC: 4.42 MIL/uL (ref 4.22–5.81)
RDW: 14.5 % (ref 11.5–15.5)
WBC: 3.7 10*3/uL — ABNORMAL LOW (ref 4.0–10.5)

## 2012-05-28 NOTE — Progress Notes (Signed)
FMTS Attending Daily Note: Tian Davison MD [phone] pager office [phone] I  have seen and examined this patient, reviewed their chart. I have discussed this patient with the resident. I agree with the resident's findings, assessment and care plan. 

## 2012-05-28 NOTE — H&P (Signed)
FMTS Attending Admission Note: Sara Neal MD [phone] pager office [phone] I  have seen and examined this patient, reviewed their chart. I have discussed this patient with the resident. I agree with the resident's findings, assessment and care plan. 

## 2012-05-28 NOTE — Progress Notes (Signed)
Pt discharged to home per MD order. Pt received and reviewed all discharge instructions and medication information including follow-up appointments and prescriptions.  Pt alert and oriented at discharge with no complaints of pain. Pt escorted to private vehicle via wheelchair by RN. Joel Donaldson

## 2012-05-28 NOTE — Progress Notes (Signed)
Family Medicine Teaching Service Daily Progress Note Service Page: [phone]  Patient Assessment: 72-y.o. male with chest pain  Subjective: Patient states he is feeling much better today.  Only complaint is some intermittent itching overnight after receiving his heparin injections.  Relieved with Benadryl cream.  Denies any current chest pain, headache, dizziness, dyspnea, N/V/D, weakness, or leg/calf pain.  Tolerating regular diet.  Ambulating freely and independently in his room.  Regarding his admission, patient states that he thinks his chest pain was due to his GERD, as he had run out of Omeprazole 3 days prior to coming to the ED.  Objective: Temp:  [97.6 F (36.4 C)-98.9 F (37.2 C)] 98.1 F (36.7 C) (04/16 0436) Pulse Rate:  [61-108] 71 (04/16 0436) Resp:  [14-30] 17 (04/15 1401) BP: (100-157)/(71-92) 131/76 mmHg (04/16 0436) SpO2:  [95 %-100 %] 100 % (04/16 0436) Weight:  [171 lb 11.2 oz (77.883 kg)-174 lb 3.2 oz (79.017 kg)] 171 lb 11.2 oz (77.883 kg) (04/16 0436)  Exam: General: Alert, oriented. NAD.  Cardiovascular: Regular rhythm. Normal S1 and S2. No murmurs, rubs, or gallops.  Respiratory: Lungs CTAB. No wheezes. No respiratory distress. Normal respiratory effort.  Abdomen: Soft, non-tender, non-distended. Normal bowel sounds.  Extremities: No edema. Intact palpable pulses B/L.  Skin: Skin is warm and dry. No diaphoresis. No rash.  Neuro: Moves all extremities spontaneously.  I have reviewed the patient's medications, labs, imaging, and diagnostic testing.  Notable results are summarized below.  CBC BMET   Recent Labs Lab 05/27/12 0947 05/28/12 0437  WBC 3.5* 3.7*  HGB 13.2 12.7*  HCT 38.6* 36.7*  PLT 201 181    Recent Labs Lab 05/27/12 0947 05/27/12 1458 05/28/12 0437  NA 138  --  137  K 4.5  --  4.3  CL 103  --  101  CO2 29  --  30  BUN 9  --  7  CREATININE 0.86 0.86 1.01  GLUCOSE 118*  --  122*  CALCIUM 9.0  --  9.1     Troponin (Point of  Care Test)  Recent Labs  05/27/12 1009  TROPIPOC 0.00   Cardiac Panel (last 3 results)  Recent Labs  05/27/12 1458 05/27/12 2024  TROPONINI <0.30 <0.30   Imaging/Diagnostic Tests:  EKG (05/28/2012): sinus arrhythmia  Plan: Joel Donaldson is a 72 y.o. year old male with a PMH of HTN, hyperlipidemia, COPD, GERD, OA, and prostate cancer presenting with chest pain. Patient his history of similar hospital admissions for chest pain. No history of MI, though he does present with significant risk factors for CAD.  1. Chest pain - Improved. No reproducible pain on chest palpation. CXR with no cardiovascular findings. TIMI2 Score 2 = 8.3% death, MI, urgent revascularization.              - POCT and Troponin x2 negative.             - EKG this AM unchanged from previous study.              - CBC and BMP within normal limits.              - Cardiac monitoring uneventful.              - Office note from Dr. Garnette Scheuermann at [facility]: Patient seen on 01/21/2012 for follow-up                 of previous hospital admission for chest pain.  Dr. Katrinka Blazing attributes past episode to likely GI                 cause.  No indication of heart disease.             - Consider cardiac stress testing on this admission due to lack of clinical data.  2.   HTN       - BP 100-157/72-92             - Continue home medications.              - Vital signs per protocol.  3. COPD - Former smoker (2 packs per day x50 years).             - CXR shows central airway thickening suggesting bronchitis.              - No respiratory distress.              - Continue home medications.  4. Hyperlipidemia - Last cholesterol panel in Nov 2013; Based on last panel, age, HTN, patient has > 28% 10-year risk for CVD, so no need to repeat.             - Start moderate-high dose statin at discharge        5.   GERD             - Continue home medications.       6.   H/o prostate cancer - Stable. No metastases. Followed by  [facility]. 7.   OA - knees             - Continue Roxicodone PRN for pain while inpatient. Avoid NSAIDs.        8.   Itching - Secondary to Heparin injections.  Improved with Benadryl cream.  - Continue Benadryl cream prn.  FEN/GI: Regular diet.    Prophylaxis: GI ppx with Protonix. DVT ppx with subq Heparin.  Disposition: Stable.  Code Status: Full code.  Lonzo Candy, Med Student 05/28/2012, 8:58 AM  I saw and examined patient with Medical Student Lonzo Candy and agree with her findings above. Please find my physical exam and assessment and plan below:  Filed Vitals:   05/28/12 0436  BP: 131/76  Pulse: 71  Temp: 98.1 F (36.7 C)  Resp:    General: in no acute distress, appears stated age, laying comfortably in bed CV: S1S2, RRR, no murmur Pulm: CTA b/l Abdomen:  No tenderness, no rebound, no distention, present bowel sounds Extr: no edema  Assessment and Plan:  72 y.o. year old male with a PMH of HTN, COPD, GERD, OA, and prostate cancer presenting with chest pain. Patient his history of similar hospital admissions for chest pain in November. No history of MI, though he does present with significant risk factors for CAD.   # Chest pain: resolved. EKG normal and troponin normal x3 Note from NOvember visit at [facility] cards reporting that chest pain was likely from GERD.  - continue BP control - consider starting statin since patient is at 28% cardivascular risk on framingham score - follow up with cardiology for possible stress test since I cannot find recent documentation for stress test  # Hypertension: controled in hospital on HCTZ and lisinopril 12.5/20  # COPD - Former smoker (2 packs per day x50 years).   - CXR shows central airway thickening suggesting bronchitis.    - No respiratory distress.    - Continue  home medications.   # Hyperlipidemia - Last cholesterol panel in Nov 2013; Based on last panel, age, HTN, patient has > 28% 10-year risk for CVD, so no need  to repeat.             - Start moderate-high dose statin at discharge  # GERD  -Continue home medications. Likely reason for chest pain # H/o prostate cancer - Stable. No metastases. Followed by [facility] # OA - knees             - Continue Roxicodone PRN for pain while inpatient. Avoid NSAIDs.      PPx: heparin Dispo: home today  Marena Chancy, PGY-2 Family Medicine Resident

## 2012-05-28 NOTE — Progress Notes (Signed)
Pt reported itching throughout whole body after heparin injections. No physical signs of an allergic reaction present. Airway patent and VSS. Pt does have dry skin and body lotion applied with minimal relieve. Dr Ermalinda Memos paged and ordered Benadryl cream.   Pt did have relieve after Benadryl applied to affected areas. Will continue to monitor.

## 2012-05-28 NOTE — Progress Notes (Signed)
Family Medicine Teaching [facility] Discharge Summary  Patient name: Joel Donaldson Medical record number: [id number] Date of birth: Dec 09, 1940 Age: 72 y.o. Gender: male Date of Admission: 05/27/2012  Date of Discharge: 05/28/2012 Admitting Physician: Nestor Ramp, MD  Primary Care Provider: [facility] Marylene Land, MD  Indication for Hospitalization: chest pain Discharge Diagnoses:  1. Chest pain 2. HTN 3. COPD 4. GERD 5. OA  Consultations: None  Significant Labs and Imaging:   CBC Lab Results  Component Value Date   WBC 3.7* 05/28/2012   HGB 12.7* 05/28/2012   HCT 36.7* 05/28/2012   MCV 83.0 05/28/2012   PLT 181 05/28/2012   BMET    Component Value Date/Time   NA 137 05/28/2012 0437   K 4.3 05/28/2012 0437   CL 101 05/28/2012 0437   CO2 30 05/28/2012 0437   GLUCOSE 122* 05/28/2012 0437   BUN 7 05/28/2012 0437   CREATININE 1.01 05/28/2012 0437   CREATININE 0.94 09/14/2010 1042   CALCIUM 9.1 05/28/2012 0437   GFRNONAA 72* 05/28/2012 0437   GFRAA 83* 05/28/2012 0437   Troponin (Point of Care Test)  Recent Labs  05/27/12 1009  TROPIPOC 0.00   Cardiac Panel (last 3 results)  Recent Labs  05/27/12 1458 05/27/12 2024  TROPONINI <0.30 <0.30   CXR (4/15): The heart size and mediastinal contours are stable.  There is aortic and brachiocephalic tortuosity. There is central  airway thickening without hyperinflation, confluent airspace  opacity or edema. There is no significant pleural effusion. The  subacromial space of both shoulders is narrowed, suggesting chronic  rotator cuff tears. There are mild degenerative changes throughout  the spine.  IMPRESSION: Central airway thickening suggesting bronchitis or viral infection.  No evidence of pneumonia or edema.  EKG (4/16): Normal sinus rhythm. No significant change from tracing in November 2013.  Procedures: None  Brief Hospital Course: Joel Donaldson is a 72-y.o. male with history of HTN, COPD, hyperlipidemia, GERD, and OA  presenting with acute onset of chest pain.  No inpatient cardiology consult sought, as patient is followed by Dr. Garnette Scheuermann at [facility]; Dr. Katrinka Blazing was alerted to patient's admission.     1. Chest pain - Reported as constant, dull, left-sided discomfort with some radiation to the left arm.  Associated diaphoresis and nausea.  Some relief with Nitroglycerin per EMS en route to ED.  Troponins negative and EKG unchanged compared to most recent tracing.  No further chest pain as of the evening of admission. Patient discharged with close outpatient follow-up with PCP and cardiologist. Chest pain was likely due to GERD symptoms, as patient had not been on his home PPI.  2. HTN - Controlled during admission.  On Lisinopril-HCTZ at home.  3. COPD - Controlled.  On Albuterol prn.   4. GERD - Likely cause of chest pain.  Patient strongly encouraged to resume home PPI.  5. OA - Resume home oxycodone 5 and percocet 5-325.  Discharge Medications:    Medication List    TAKE these medications       albuterol 108 (90 BASE) MCG/ACT inhaler  Commonly known as:  PROVENTIL HFA;VENTOLIN HFA  Inhale 2 puffs into the lungs every 6 (six) hours as needed for wheezing or shortness of breath.     ALREX 0.2 % Susp  Generic drug:  loteprednol  Place 2 drops into both eyes 2 (two) times daily.     aspirin 81 MG EC tablet  Take 81 mg by mouth daily.  lisinopril-hydrochlorothiazide 20-12.5 MG per tablet  Commonly known as:  PRINZIDE,ZESTORETIC  Take 1 tablet by mouth daily.     nitroGLYCERIN 0.4 MG SL tablet  Commonly known as:  NITROSTAT  Place 0.4 mg under the tongue every 5 (five) minutes as needed for chest pain.     nitroGLYCERIN 0.1 mg/hr  Commonly known as:  NITRODUR - Dosed in mg/24 hr  Place onto the skin daily. 1/4 of a patch     omeprazole 40 MG capsule  Commonly known as:  PRILOSEC  Take 1 capsule (40 mg total) by mouth every morning.     oxyCODONE-acetaminophen 10-325 MG per  tablet  Commonly known as:  PERCOCET  Take 1 tablet by mouth every 8 (eight) hours as needed for pain.       Issues for Follow Up:  - Ensure patient is continuing to take his home PPI. - Follow up on chest pain.  Outstanding Results: None  Discharge Instructions: Please refer to Patient Instructions section of EMR for full details.  Patient was counseled important signs and symptoms that should prompt return to medical care, changes in medications, dietary instructions, activity restrictions, and follow up appointments.   Follow-up Information   Follow up with [facility], Marylene Land, MD On 06/02/2012. (9:45am)    Contact information:   [address] [place] [address] [phone]       Follow up with Lesleigh Noe, MD. Schedule an appointment as soon as possible for a visit in 2 weeks.   Contact information:   [address] [place] [address] [phone]       Discharge Condition: Stable.  Joel Donaldson, Med Student 05/28/2012, 2:37 PM

## 2012-05-29 NOTE — Discharge Summary (Signed)
Family Medicine Teaching [facility] Discharge Summary  Patient name: Joel Donaldson Medical record number: [id number] Date of birth: 1940/09/22 Age: 72 y.o. Gender: male Date of Admission: 05/27/2012  Date of Discharge: 05/28/2012 Admitting Physician: Nestor Ramp, MD  Primary Care Provider: [facility], Marylene Land, MD  Indication for Hospitalization: chest pain Discharge Diagnoses:  1. Chest pain 2. HTN 3. COPD 4. GERD 5. OA  Consultations: None  Significant Labs and Imaging:   CBC Lab Results  Component Value Date   WBC 3.7* 05/28/2012   HGB 12.7* 05/28/2012   HCT 36.7* 05/28/2012   MCV 83.0 05/28/2012   PLT 181 05/28/2012   BMET    Component Value Date/Time   NA 137 05/28/2012 0437   K 4.3 05/28/2012 0437   CL 101 05/28/2012 0437   CO2 30 05/28/2012 0437   GLUCOSE 122* 05/28/2012 0437   BUN 7 05/28/2012 0437   CREATININE 1.01 05/28/2012 0437   CREATININE 0.94 09/14/2010 1042   CALCIUM 9.1 05/28/2012 0437   GFRNONAA 72* 05/28/2012 0437   GFRAA 83* 05/28/2012 0437   Troponin (Point of Care Test)  Recent Labs  05/27/12 1009  TROPIPOC 0.00   Cardiac Panel (last 3 results)  Recent Labs  05/27/12 1458 05/27/12 2024  TROPONINI <0.30 <0.30   CXR (4/15): The heart size and mediastinal contours are stable.  There is aortic and brachiocephalic tortuosity. There is central  airway thickening without hyperinflation, confluent airspace  opacity or edema. There is no significant pleural effusion. The  subacromial space of both shoulders is narrowed, suggesting chronic  rotator cuff tears. There are mild degenerative changes throughout  the spine.  IMPRESSION: Central airway thickening suggesting bronchitis or viral infection.  No evidence of pneumonia or edema.  EKG (4/16): Normal sinus rhythm. No significant change from tracing in November 2013.  Procedures: None  Brief Hospital Course: Brysen Shankman is a 72-y.o. male with history of HTN, COPD, hyperlipidemia, GERD, and OA  presenting with acute onset of chest pain.  No inpatient cardiology consult sought, as patient is followed by Dr. Garnette Scheuermann at [facility]; Dr. Katrinka Blazing was alerted to patient's admission.     1. Chest pain - Reported as constant, dull, left-sided discomfort with some radiation to the left arm.  Associated diaphoresis and nausea.  Some relief with Nitroglycerin per EMS en route to ED.  Troponins negative and EKG unchanged compared to most recent tracing.  No further chest pain as of the evening of admission. Patient discharged with close outpatient follow-up with PCP and cardiologist. Chest pain was likely due to GERD symptoms, as patient had not been on his home PPI.  2. HTN - Controlled during admission.  On Lisinopril-HCTZ at home.  3. COPD - Controlled.  On Albuterol prn.   4. GERD - Likely cause of chest pain.  Patient strongly encouraged to resume home PPI.  5. OA - Resume home oxycodone 5 and percocet 5-325.  Discharge Medications:    Medication List    TAKE these medications       albuterol 108 (90 BASE) MCG/ACT inhaler  Commonly known as:  PROVENTIL HFA;VENTOLIN HFA  Inhale 2 puffs into the lungs every 6 (six) hours as needed for wheezing or shortness of breath.     ALREX 0.2 % Susp  Generic drug:  loteprednol  Place 2 drops into both eyes 2 (two) times daily.     aspirin 81 MG EC tablet  Take 81 mg by mouth daily.  lisinopril-hydrochlorothiazide 20-12.5 MG per tablet  Commonly known as:  PRINZIDE,ZESTORETIC  Take 1 tablet by mouth daily.     nitroGLYCERIN 0.4 MG SL tablet  Commonly known as:  NITROSTAT  Place 0.4 mg under the tongue every 5 (five) minutes as needed for chest pain.     nitroGLYCERIN 0.1 mg/hr  Commonly known as:  NITRODUR - Dosed in mg/24 hr  Place onto the skin daily. 1/4 of a patch     omeprazole 40 MG capsule  Commonly known as:  PRILOSEC  Take 1 capsule (40 mg total) by mouth every morning.     oxyCODONE-acetaminophen 10-325 MG per  tablet  Commonly known as:  PERCOCET  Take 1 tablet by mouth every 8 (eight) hours as needed for pain.       Issues for Follow Up:  - Ensure patient is continuing to take his home PPI. - Follow up on chest pain.  Outstanding Results: None  Discharge Instructions: Please refer to Patient Instructions section of EMR for full details.  Patient was counseled important signs and symptoms that should prompt return to medical care, changes in medications, dietary instructions, activity restrictions, and follow up appointments.   Follow-up Information   Follow up with [facility], Marylene Land, MD On 06/02/2012. (9:45am)    Contact information:   [address] [place] [address] [phone]       Follow up with Lesleigh Noe, MD. Schedule an appointment as soon as possible for a visit in 2 weeks.   Contact information:   [address] [place] [address] [phone]       Discharge Condition: Stable.  Marena Chancy, PGY-2 Family Medicine Resident

## 2012-05-30 NOTE — Discharge Summary (Signed)
Family Medicine Teaching Service  Discharge Note : Attending Dera Vanaken MD Pager [phone] Office [phone] I have seen and examined this patient, reviewed their chart and discussed discharge planning wit the resident at the time of discharge. I agree with the discharge plan as above.  

## 2012-06-02 ENCOUNTER — Ambulatory Visit (INDEPENDENT_AMBULATORY_CARE_PROVIDER_SITE_OTHER): Payer: PRIVATE HEALTH INSURANCE | Admitting: Family Medicine

## 2012-06-02 VITALS — BP 124/77 | HR 101 | Ht 70.0 in | Wt 176.0 lb

## 2012-06-02 DIAGNOSIS — M67911 Unspecified disorder of synovium and tendon, right shoulder: Secondary | ICD-10-CM

## 2012-06-02 DIAGNOSIS — M67919 Unspecified disorder of synovium and tendon, unspecified shoulder: Secondary | ICD-10-CM

## 2012-06-02 DIAGNOSIS — K219 Gastro-esophageal reflux disease without esophagitis: Secondary | ICD-10-CM

## 2012-06-02 DIAGNOSIS — C61 Malignant neoplasm of prostate: Secondary | ICD-10-CM

## 2012-06-02 DIAGNOSIS — M719 Bursopathy, unspecified: Secondary | ICD-10-CM

## 2012-06-02 MED ORDER — OXYCODONE-ACETAMINOPHEN 10-325 MG PO TABS: 1.0000 | ORAL_TABLET | Freq: Every day | ORAL | Status: DC | PRN

## 2012-06-02 NOTE — Patient Instructions (Addendum)
Try to decrease Percocet to 1 tablet a day Follow-up in 1 month    Please re-schedule your colonoscopy and schedule an appointment with your cardiologist

## 2012-06-02 NOTE — Assessment & Plan Note (Signed)
Chest pain seems GERD related and is now resolved since he is back on PPI.

## 2012-06-02 NOTE — Assessment & Plan Note (Addendum)
Documentation only. He was told at last appointment (about a month ago), he was doing well and was told to follow-up in 3 months

## 2012-06-02 NOTE — Progress Notes (Signed)
  Subjective:    Patient ID: Joel Donaldson, male    DOB: February 07, 1941, 72 y.o.   MRN: [id number]  HPI # Hospital follow-up for chest pain thought likely secondary to GERD. His symptoms had improved with PPI/GI cocktail, and he had run out of home PPI. Outpatient cardiology group physician evaluated in hospital and thought cardiac etiology unlikely.  He denies any chest pain today. He has not had to use NTG. He is taking PPI.   His next cardiology appointment--he needs to make an appointment prior to his getting colonoscopy. He needs to re-schedule colonoscopy since the day he was supposed to get it done, he was in the hospital.   # Right rotator cuff tear with tendinopathy  He is doing range of motion exercises 4 times a day (internal and external rotation, walking hand up wall) He requires Percocet twice daily   He tried NTG patches on shoulder for maybe 2 days and stopped.   Review of Systems Denies difficulty breathing, constipation   Allergies, medication, past medical history reviewed.  Smoking status noted. Alcohol use COPD DJD HLD, HTN Prostate cancer s/p seeds--he was told at last appointment (about a month ago), he was doing well and was told to follow-up in 3 months  Rotator cuff tendinopathy     Objective:   Physical Exam GEN: NAD; well-appearing PULM: NI WOB CV: RRR ABD: soft, NT EXT: no edema RIGHT SHOULDER:    ROM: abduction and flexion 110 deg (active and passive)     Assessment & Plan:

## 2012-06-02 NOTE — Assessment & Plan Note (Signed)
ROM seems to be improving. Pain may be improved; he is requiring Percocet twice a day. We will try to decrease to once daily. 45 tablets given for month. Follow-up in 1 month. Encouraged him to continue ROM exercises 4-5 times a day.

## 2012-06-03 NOTE — Progress Notes (Signed)
Family Medicine Teaching Service  Discharge Note : Attending Johney Perotti MD Pager [phone] Office [phone] I have seen and examined this patient, reviewed their chart and discussed discharge planning wit the resident at the time of discharge. I agree with the discharge plan as above.  

## 2012-07-02 ENCOUNTER — Encounter: Payer: Self-pay | Admitting: Family Medicine

## 2012-07-02 ENCOUNTER — Ambulatory Visit (INDEPENDENT_AMBULATORY_CARE_PROVIDER_SITE_OTHER): Payer: PRIVATE HEALTH INSURANCE | Admitting: Family Medicine

## 2012-07-02 VITALS — BP 134/76 | HR 101 | Temp 98.3°F | Ht 70.0 in | Wt 180.0 lb

## 2012-07-02 DIAGNOSIS — M67911 Unspecified disorder of synovium and tendon, right shoulder: Secondary | ICD-10-CM

## 2012-07-02 DIAGNOSIS — M719 Bursopathy, unspecified: Secondary | ICD-10-CM

## 2012-07-02 MED ORDER — OXYCODONE-ACETAMINOPHEN 10-325 MG PO TABS: 1.0000 | ORAL_TABLET | Freq: Every day | ORAL | Status: DC | PRN

## 2012-07-02 NOTE — Assessment & Plan Note (Signed)
Continues to improve.  He is still taking Percocet bid. It helps shoulder and chronic knee pain.  We discussed how this is not a good medication to be on long-term; he also drinks significant amount of alcohol (1 pint of vodka daily).  We will try Percocet once a day.  May consider something weaker like Vicodin long-term but discussed how at follow-up in 1 month, unlikely to continue Percocet.  Commended him on doing shoulder exercises tid; continue; consider decreasing frequency if shoulder continues to improve in 1 month.

## 2012-07-02 NOTE — Progress Notes (Signed)
  Subjective:    Patient ID: Joel Donaldson, male    DOB: 1940/11/04, 72 y.o.   MRN: [id number]  HPI # Right rotator cuff tear with tendinopathy We had been trying to wean off of Percocet at last visit 04/21 and ROM exercises were encouraged.   Today, he reports his shoulder has been doing "pretty well".  He ran out of his Percocet about a week ago, and he has been taking his wife's.  He has been requiring 2 Percocet a day.  He also reports Percocet helps his knee pain.  He does shoulder exercises three times a day. ROS: denies constipation (sometimes he needs to take a laxative)  Review of Systems Per HPI  Allergies, medication, past medical history reviewed.  Smoking status noted.     Objective:   Physical Exam GEN: NAD; well-appearing  PULM: NI WOB  CV: RRR  ABD: soft, NT  EXT: no edema  RIGHT SHOULDER:    Appearance: normal   Tender: mild over anterior shoulder, near bicipital groove   ROM:      Flexion: 160 deg     Abduction: 120 deg     External rotation: able to touch back about waist-level (LEFT able to touch bottom of opposite scapula)    Strength: 4/5 biceps/triceps     Assessment & Plan:

## 2012-07-02 NOTE — Patient Instructions (Addendum)
Follow-up in 1 month   You are doing a great job on those exercises Continue 3 times a day for the next month  Percocet once a day

## 2012-08-05 ENCOUNTER — Ambulatory Visit (INDEPENDENT_AMBULATORY_CARE_PROVIDER_SITE_OTHER): Payer: PRIVATE HEALTH INSURANCE | Admitting: Family Medicine

## 2012-08-05 VITALS — BP 143/90 | HR 105 | Temp 98.4°F | Wt 177.0 lb

## 2012-08-05 DIAGNOSIS — M67911 Unspecified disorder of synovium and tendon, right shoulder: Secondary | ICD-10-CM

## 2012-08-05 DIAGNOSIS — M6790 Unspecified disorder of synovium and tendon, unspecified site: Secondary | ICD-10-CM

## 2012-08-05 MED ORDER — OXYCODONE-ACETAMINOPHEN 10-325 MG PO TABS: 1.0000 | ORAL_TABLET | Freq: Every day | ORAL | Status: DC | PRN

## 2012-08-05 MED ORDER — OXYCODONE-ACETAMINOPHEN 10-325 MG PO TABS: 1.0000 | ORAL_TABLET | Freq: Two times a day (BID) | ORAL | Status: DC | PRN

## 2012-08-05 NOTE — Assessment & Plan Note (Signed)
ROM has deteriorated due to not doing exercises due to running out of pain medications.  -After much discussion, we will go ahead and continue Percocet bid to allow him to due ROM exercises which had helped his ROM significantly. He has not shown signs of abusing medication and tries to take as directed. He will stop taking vodka while on Percocet.  -Follow-up in 1 month. I feel okay with continuing medication over next several months if he needs unless he shows signs of abusing it due to his significant injury and his not being good surgical candidate (he does not want and is being treated for prostate cancer). He was advised not to get pain medication for any where else except [place]. Follow-up sooner if pain worsens.

## 2012-08-05 NOTE — Progress Notes (Signed)
  Subjective:    Patient ID: Joel Donaldson, male    DOB: 07-02-1940, 72 y.o.   MRN: [id number]  HPI # Rotator cuff tendinopathy, right He had been taking Percocet once a day prior to running out 1 week ago.  He reports worsening ROM of shoulder. He had been doing exercises 3 times a day with pain medication. He has not been able to do any at this time.   Review of Systems Denies constipation   Allergies, medication, past medical history reviewed.  Smoking status noted.  He drinks pint of vodka every 3 days due to helping with pain      Objective:   Physical Exam GEN: NAD; elderly, chronically ill-appearing NEURO: bilateral resting hand tremor PULM: NI WOB RIGHT SHOULDER:    Tender throughout anterior shoulder   ROM significantly decreased since last visit: 45 deg abduction, 60 deg flexion    Assessment & Plan:

## 2012-08-05 NOTE — Patient Instructions (Addendum)
You may take percocet up to twice a day as needed Please resume those exercises  Follow-up in 1 month

## 2012-09-08 ENCOUNTER — Ambulatory Visit (INDEPENDENT_AMBULATORY_CARE_PROVIDER_SITE_OTHER): Payer: PRIVATE HEALTH INSURANCE | Admitting: Family Medicine

## 2012-09-08 ENCOUNTER — Encounter: Payer: Self-pay | Admitting: Family Medicine

## 2012-09-08 VITALS — BP 166/96 | HR 86 | Temp 98.5°F | Wt 178.0 lb

## 2012-09-08 DIAGNOSIS — M719 Bursopathy, unspecified: Secondary | ICD-10-CM

## 2012-09-08 DIAGNOSIS — M67911 Unspecified disorder of synovium and tendon, right shoulder: Secondary | ICD-10-CM

## 2012-09-08 DIAGNOSIS — K219 Gastro-esophageal reflux disease without esophagitis: Secondary | ICD-10-CM

## 2012-09-08 DIAGNOSIS — I10 Essential (primary) hypertension: Secondary | ICD-10-CM

## 2012-09-08 MED ORDER — OXYCODONE-ACETAMINOPHEN 10-325 MG PO TABS: 1.0000 | ORAL_TABLET | Freq: Two times a day (BID) | ORAL | Status: DC | PRN

## 2012-09-08 MED ORDER — DICLOFENAC SODIUM 1 % TD GEL: 2.0000 g | Freq: Four times a day (QID) | TRANSDERMAL | Status: DC | PRN

## 2012-09-08 MED ORDER — LISINOPRIL-HYDROCHLOROTHIAZIDE 20-12.5 MG PO TABS: 1.0000 | ORAL_TABLET | Freq: Every day | ORAL | Status: DC

## 2012-09-08 NOTE — Progress Notes (Signed)
Joel Donaldson is a 72 y.o. male who presents to [facility] today for shoulder pain.  SHould pain: percocet BID. Shoulder exercises Bilat TID w/ some benefit. Pain primarily in shoulder w/ occasional radiation down arm. Previous shoulder steroid injection w/ only 1-2 days of relief in April at [facility] clinic. Pt states that Ortho said pt is too old for surgery. Tylenol w/o benefit. Pain is constant w/o any relief. H/o surgery many years ago on effected shoulder. PT for 2 wks previously w/ worsening of symptoms.   Asthma: uses albuterol at most will go a couple of days w/o. Sometimes uses BID. Environmental triggers. Worse in summer.   GERD: prilosec w/ excellent control  HTN: Denies palpitations, CP, syncope, SOB, lightheadedness. Did not take BP meds this am.   The following portions of the patient's history were reviewed and updated as appropriate: allergies, current medications, past medical history, family and social history, and problem list.  Patient is a nonsmoker.  Past Medical History  Diagnosis Date  . Arthritis     hands, back, knees  . Anxiety   . Depression   . GERD (gastroesophageal reflux disease)   . Glaucoma   . Hypertension   . Prostate cancer   . Heart murmur asymptomatic  . Short of breath on exertion   . Asthma   . Nocturia   . Hx of radioisotope therapy 07/26/11    Radiosactive Prostate Seed Implant/ I-125 Seeds  . Chest pain at rest 12/19/2011    ROS as above otherwise neg.    Medications reviewed. Current Outpatient Prescriptions  Medication Sig Dispense Refill  . albuterol (PROVENTIL HFA;VENTOLIN HFA) 108 (90 BASE) MCG/ACT inhaler Inhale 2 puffs into the lungs every 6 (six) hours as needed for wheezing or shortness of breath.      . ALREX 0.2 % SUSP Place 2 drops into both eyes 2 (two) times daily.       Marland Kitchen aspirin 81 MG EC tablet Take 81 mg by mouth daily.       Marland Kitchen lisinopril-hydrochlorothiazide (PRINZIDE,ZESTORETIC) 20-12.5 MG per tablet Take 1 tablet by mouth  daily.  30 tablet  6  . nitroGLYCERIN (NITRODUR - DOSED IN MG/24 HR) 0.1 mg/hr Place onto the skin daily. 1/4 of a patch      . nitroGLYCERIN (NITROSTAT) 0.4 MG SL tablet Place 0.4 mg under the tongue every 5 (five) minutes as needed for chest pain.      Marland Kitchen omeprazole (PRILOSEC) 40 MG capsule Take 1 capsule (40 mg total) by mouth every morning.  30 capsule  12  . oxyCODONE-acetaminophen (PERCOCET) 10-325 MG per tablet Take 1 tablet by mouth 2 (two) times daily as needed for pain.  60 tablet  0   No current facility-administered medications for this visit.    Exam:  BP 166/96  Pulse 86  Temp(Src) 98.5 F (36.9 C) (Oral)  Wt 178 lb (80.74 kg)  BMI 25.54 kg/m2 Gen: Well NAD HEENT: EOMI,  MMM Lungs: CTABL Nl WOB Heart: RRR II/VI systolic murmur MSK: R arm ROM limited to 45 degrees w/ abduction and 90 degrees, + R hawkins and empty can maneuvers.    No results found for this or any previous visit (from the past 72 hour(s)).

## 2012-09-08 NOTE — Assessment & Plan Note (Signed)
Still w/ significant impairment to R shoulder from rotator cuff tear Refill percocet today Will consider amitriptyline in the future Pt amenable to surgical evaluation as I think he could get significant benefit from surgery.

## 2012-09-08 NOTE — Assessment & Plan Note (Signed)
Elevated today Did not take BP meds this am No symptoms from elevated BP today Continue current therapy Previous BPs nml

## 2012-09-08 NOTE — Patient Instructions (Addendum)
Thank you for coming in today Your overall health is well Please continue taking all of your medications as prescribed Please continue taking the percocet as needed for pain. Try to take as little of this as possible Please start using the Voltaren Gel for the pain Please continue your exercises Please go see the orthopedic surgeon to discuss surgery Please come back to see me in 1 month.   Rotator Cuff Injury The rotator cuff is the collective set of muscles and tendons that make up the stabilizing unit of your shoulder. This unit holds in the ball of the humerus (upper arm bone) in the socket of the scapula (shoulder blade). Injuries to this stabilizing unit most commonly come from sports or activities that cause the arm to be moved repeatedly over the head. Examples of this include throwing, weight lifting, swimming, racquet sports, or an injury such as falling on your arm. Chronic (longstanding) irritation of this unit can cause inflammation (soreness), bursitis, and eventual damage to the tendons to the point of rupture (tear). An acute (sudden) injury of the rotator cuff can result in a partial or complete tear. You may need surgery with complete tears. Small or partial rotator cuff tears may be treated conservatively with temporary immobilization, exercises and rest. Physical therapy may be needed. HOME CARE INSTRUCTIONS   Apply ice to the injury for 15-20 minutes 3-4 times per day for the first 2 days. Put the ice in a plastic bag and place a towel between the bag of ice and your skin.  If you have a shoulder immobilizer (sling and straps), do not remove it for as long as directed by your caregiver or until you see a caregiver for a follow-up examination. If you need to remove it, move your arm as little as possible.  You may want to sleep on several pillows or in a recliner at night to lessen swelling and pain.  Only take over-the-counter or prescription medicines for pain, discomfort, or  fever as directed by your caregiver.  Do simple hand squeezing exercises with a soft rubber ball to decrease hand swelling. SEEK MEDICAL CARE IF:   Pain in your shoulder increases or new pain or numbness develops in your arm, hand, or fingers.  Your hand or fingers are colder than your other hand. SEEK IMMEDIATE MEDICAL CARE IF:   Your arm, hand, or fingers are numb or tingling.  Your arm, hand, or fingers are increasingly swollen and painful, or turn white or blue. Document Released: 01/27/2000 Document Revised: 04/23/2011 Document Reviewed: 01/20/2008 [facility] Patient Information 2014 [place], [place].

## 2012-09-08 NOTE — Assessment & Plan Note (Signed)
conts to drink only a pint of whiskey wkly

## 2012-09-08 NOTE — Assessment & Plan Note (Signed)
Cont prilosec  

## 2012-09-30 ENCOUNTER — Encounter: Payer: Self-pay | Admitting: *Deleted

## 2012-10-10 ENCOUNTER — Ambulatory Visit (INDEPENDENT_AMBULATORY_CARE_PROVIDER_SITE_OTHER): Payer: PRIVATE HEALTH INSURANCE | Admitting: Family Medicine

## 2012-10-10 ENCOUNTER — Encounter: Payer: Self-pay | Admitting: Family Medicine

## 2012-10-10 VITALS — BP 139/84 | HR 86 | Temp 98.2°F | Ht 70.0 in | Wt 179.7 lb

## 2012-10-10 DIAGNOSIS — M069 Rheumatoid arthritis, unspecified: Secondary | ICD-10-CM | POA: Insufficient documentation

## 2012-10-10 DIAGNOSIS — M67911 Unspecified disorder of synovium and tendon, right shoulder: Secondary | ICD-10-CM

## 2012-10-10 DIAGNOSIS — M6790 Unspecified disorder of synovium and tendon, unspecified site: Secondary | ICD-10-CM

## 2012-10-10 DIAGNOSIS — K219 Gastro-esophageal reflux disease without esophagitis: Secondary | ICD-10-CM

## 2012-10-10 DIAGNOSIS — I10 Essential (primary) hypertension: Secondary | ICD-10-CM

## 2012-10-10 HISTORY — DX: Rheumatoid arthritis, unspecified: M06.9

## 2012-10-10 MED ORDER — LISINOPRIL-HYDROCHLOROTHIAZIDE 20-12.5 MG PO TABS: 1.0000 | ORAL_TABLET | Freq: Every day | ORAL | Status: DC

## 2012-10-10 MED ORDER — OXYCODONE-ACETAMINOPHEN 10-325 MG PO TABS: 1.0000 | ORAL_TABLET | Freq: Two times a day (BID) | ORAL | Status: DC | PRN

## 2012-10-10 NOTE — Progress Notes (Signed)
Joel Donaldson is a 72 y.o. male who presents to [facility] today for f/u for shoulder pain   Shoulder pain: pain improved since last month. Cortisone shot at [employer]. Will need shoulder replacement surgery. Exercises helped. Pt not a surgical candidate due to the severity of the shoulder degeneration. Strength and sensation intact in hand. Voltaren gel w/ some relief  Reflux: still taking prilosec daily. When stops develops CP. Previously had to go to hospital. All foods are described as triggers.   HTN: No CP SOB. Taking medications as prescribed   The following portions of the patient's history were reviewed and updated as appropriate: allergies, current medications, past medical history, family and social history, and problem list.  Patient is a nonsmoker.   Past Medical History  Diagnosis Date  . Arthritis     hands, back, knees  . Anxiety   . Depression   . GERD (gastroesophageal reflux disease)   . Glaucoma   . Hypertension   . Prostate cancer   . Heart murmur asymptomatic  . Short of breath on exertion   . Asthma   . Nocturia   . Hx of radioisotope therapy 07/26/11    Radiosactive Prostate Seed Implant/ I-125 Seeds  . Chest pain at rest 12/19/2011    ROS as above otherwise neg.    Medications reviewed. Current Outpatient Prescriptions  Medication Sig Dispense Refill  . albuterol (PROVENTIL HFA;VENTOLIN HFA) 108 (90 BASE) MCG/ACT inhaler Inhale 2 puffs into the lungs every 6 (six) hours as needed for wheezing or shortness of breath.      . ALREX 0.2 % SUSP Place 2 drops into both eyes 2 (two) times daily.       Marland Kitchen aspirin 81 MG EC tablet Take 81 mg by mouth daily.       . diclofenac sodium (VOLTAREN) 1 % GEL Apply 2 g topically 4 (four) times daily as needed.  100 g  3  . lisinopril-hydrochlorothiazide (PRINZIDE,ZESTORETIC) 20-12.5 MG per tablet Take 1 tablet by mouth daily.  30 tablet  6  . nitroGLYCERIN (NITRODUR - DOSED IN MG/24 HR) 0.1 mg/hr Place onto the skin  daily. 1/4 of a patch      . nitroGLYCERIN (NITROSTAT) 0.4 MG SL tablet Place 0.4 mg under the tongue every 5 (five) minutes as needed for chest pain.      Marland Kitchen omeprazole (PRILOSEC) 40 MG capsule Take 1 capsule (40 mg total) by mouth every morning.  30 capsule  12  . oxyCODONE-acetaminophen (PERCOCET) 10-325 MG per tablet Take 1 tablet by mouth 2 (two) times daily as needed for pain.  60 tablet  0   No current facility-administered medications for this visit.    Exam: BP 139/84  Pulse 86  Temp(Src) 98.2 F (36.8 C) (Oral)  Ht 5\' 10"  (1.778 m)  Wt 179 lb 11.2 oz (81.511 kg)  BMI 25.78 kg/m2 Gen: Well NAD HEENT: EOMI,  MMM Lungs: CTABL Nl WOB Heart: RRR no MRG Abd: NABS, NT, ND Exts: Non edematous BL  LE, warm and well perfused.  MSK: R shoulder w/ mild ttp and FROM  No results found for this or any previous visit (from the past 72 hour(s)).

## 2012-10-10 NOTE — Assessment & Plan Note (Signed)
Try changing PPI to QOD May need to return to daily

## 2012-10-10 NOTE — Patient Instructions (Addendum)
Thank you for coming in today Please continue your home exerciese Please try to cut back on the percocet Please try taking the prilosec every other day. If your heart burn comes back then go back to daily Please come back to see me in 1 month Have a great weekend.

## 2012-10-10 NOTE — Assessment & Plan Note (Signed)
Not a surgical candidate Continue periodic injections and refills on Perc Continue exercises

## 2012-10-10 NOTE — Assessment & Plan Note (Signed)
Continue HCTZ/Lisinopril Refills sent today

## 2012-11-10 ENCOUNTER — Encounter: Payer: Self-pay | Admitting: Family Medicine

## 2012-11-10 ENCOUNTER — Ambulatory Visit (INDEPENDENT_AMBULATORY_CARE_PROVIDER_SITE_OTHER): Payer: PRIVATE HEALTH INSURANCE | Admitting: Family Medicine

## 2012-11-10 VITALS — BP 110/70 | HR 80 | Temp 99.0°F | Ht 70.0 in | Wt 176.0 lb

## 2012-11-10 DIAGNOSIS — M069 Rheumatoid arthritis, unspecified: Secondary | ICD-10-CM

## 2012-11-10 DIAGNOSIS — I10 Essential (primary) hypertension: Secondary | ICD-10-CM

## 2012-11-10 DIAGNOSIS — M1990 Unspecified osteoarthritis, unspecified site: Secondary | ICD-10-CM

## 2012-11-10 DIAGNOSIS — R259 Unspecified abnormal involuntary movements: Secondary | ICD-10-CM

## 2012-11-10 DIAGNOSIS — G250 Essential tremor: Secondary | ICD-10-CM

## 2012-11-10 MED ORDER — OXYCODONE-ACETAMINOPHEN 5-325 MG PO TABS: 1.0000 | ORAL_TABLET | Freq: Two times a day (BID) | ORAL | Status: DC | PRN

## 2012-11-10 NOTE — Progress Notes (Signed)
Joel Donaldson is a 72 y.o. male who presents to [facility] today for shoulder pain.   Prostate Ca: continues regular appts w/ Dr. Julien Girt.  HTN: taking BP meds. Denies CP, palp, HA, sob   Shoulder pain: tried stopping Percocet cold [place]. Made it 2 days before shoulder pain became unbearable and had to restart. Exercises w/ benefit. voltaren gel helps. Orthopedic surgeon stated non-surgical candidate.  Hand shaking: off an on for past year. Only in hands. Sometimes when reaching to grab something. Most pronounced when at rest. No falls or difficulty walking. No Fmhx of parkinsons.   RA: regular appts w/ rheumatologist w/o need for medical therapy.   The following portions of the patient's history were reviewed and updated as appropriate: allergies, current medications, past medical history, family and social history, and problem list.  Patient is a nonsmoker.  Past Medical History  Diagnosis Date  . Arthritis     hands, back, knees  . Anxiety   . Depression   . GERD (gastroesophageal reflux disease)   . Glaucoma   . Hypertension   . Prostate cancer   . Heart murmur asymptomatic  . Short of breath on exertion   . Asthma   . Nocturia   . Hx of radioisotope therapy 07/26/11    Radiosactive Prostate Seed Implant/ I-125 Seeds  . Chest pain at rest 12/19/2011  . Rheumatoid arthritis 10/10/2012    ROS as above otherwise neg.    Medications reviewed. Current Outpatient Prescriptions  Medication Sig Dispense Refill  . albuterol (PROVENTIL HFA;VENTOLIN HFA) 108 (90 BASE) MCG/ACT inhaler Inhale 2 puffs into the lungs every 6 (six) hours as needed for wheezing or shortness of breath.      . ALREX 0.2 % SUSP Place 2 drops into both eyes 2 (two) times daily.       Marland Kitchen aspirin 81 MG EC tablet Take 81 mg by mouth daily.       . diclofenac sodium (VOLTAREN) 1 % GEL Apply 2 g topically 4 (four) times daily as needed.  100 g  3  . lisinopril-hydrochlorothiazide (PRINZIDE,ZESTORETIC) 20-12.5 MG per  tablet Take 1 tablet by mouth daily.  90 tablet  3  . nitroGLYCERIN (NITRODUR - DOSED IN MG/24 HR) 0.1 mg/hr Place onto the skin daily. 1/4 of a patch      . nitroGLYCERIN (NITROSTAT) 0.4 MG SL tablet Place 0.4 mg under the tongue every 5 (five) minutes as needed for chest pain.      Marland Kitchen omeprazole (PRILOSEC) 40 MG capsule Take 1 capsule (40 mg total) by mouth every morning.  30 capsule  12  . oxyCODONE-acetaminophen (PERCOCET) 10-325 MG per tablet Take 1 tablet by mouth 2 (two) times daily as needed for pain.  60 tablet  0   No current facility-administered medications for this visit.    Exam:  BP 110/70  Pulse 80  Temp(Src) 99 F (37.2 C) (Oral)  Ht 5\' 10"  (1.778 m)  Wt 176 lb (79.833 kg)  BMI 25.25 kg/m2 Gen: Well NAD HEENT: EOMI,  MMM Neuro: resting pill rolling tremor (slight), gait narrow and nml. No cogwheel rigidity, affect nml.   No results found for this or any previous visit (from the past 72 hour(s)).

## 2012-11-10 NOTE — Patient Instructions (Addendum)
You are doing well overall. Please come back in 2 months  Please continue your exercises the gel and try slowly coming off the percocet. Please let me know in 2 months if the tremor gets worse Please come in sooner for any other reasons.

## 2012-11-10 NOTE — Assessment & Plan Note (Signed)
Concern for possible early parkinsons Will defer treatment at this time as w/o other clinical features of PD.  F/u in 3 mo.  Consider Levo/Car if not improving, worsening, or showing other signs

## 2012-11-10 NOTE — Assessment & Plan Note (Signed)
Off methotrexate per pt.  Will need to clarify at next appt

## 2012-11-10 NOTE — Assessment & Plan Note (Signed)
Excellent control today No change.

## 2012-11-10 NOTE — Assessment & Plan Note (Signed)
Consider propranolol if worsens and if BP will support No loss of function

## 2012-11-10 NOTE — Assessment & Plan Note (Signed)
Did not tolerate coming off percocet cold [place] (not my recommendation) Will decrease to 5/325 (down from 10/325) Continue Voltaren gel and exercises as helping Non surgical candidate

## 2012-12-09 ENCOUNTER — Encounter (HOSPITAL_COMMUNITY): Payer: Self-pay | Admitting: Emergency Medicine

## 2012-12-09 ENCOUNTER — Emergency Department (INDEPENDENT_AMBULATORY_CARE_PROVIDER_SITE_OTHER): Payer: PRIVATE HEALTH INSURANCE

## 2012-12-09 ENCOUNTER — Emergency Department (INDEPENDENT_AMBULATORY_CARE_PROVIDER_SITE_OTHER)
Admission: EM | Admit: 2012-12-09 | Discharge: 2012-12-09 | Disposition: A | Discharge status: Home / Self Care | Payer: PRIVATE HEALTH INSURANCE | Source: Home / Self Care | Attending: Emergency Medicine | Admitting: Emergency Medicine

## 2012-12-09 DIAGNOSIS — M436 Torticollis: Secondary | ICD-10-CM

## 2012-12-09 MED ORDER — OXYCODONE-ACETAMINOPHEN 5-325 MG PO TABS: ORAL_TABLET | ORAL | Status: DC

## 2012-12-09 MED ORDER — ACETAMINOPHEN 325 MG PO TABS
ORAL_TABLET | ORAL | Status: AC
  Filled 2012-12-09: qty 2

## 2012-12-09 MED ORDER — ACETAMINOPHEN 325 MG PO TABS
650.0000 mg | ORAL_TABLET | Freq: Once | ORAL | Status: AC
  Administered 2012-12-09: 650 mg via ORAL

## 2012-12-09 MED ORDER — METHOCARBAMOL 500 MG PO TABS: 500.0000 mg | ORAL_TABLET | Freq: Three times a day (TID) | ORAL | Status: DC

## 2012-12-09 NOTE — ED Provider Notes (Signed)
Chief Complaint:   Chief Complaint  Patient presents with  . Torticollis    History of Present Illness:   Joel Donaldson is a 72 year old male who woke this morning with pain in the right side of his neck and difficulty in rotating or bending his neck to the right. The pain is confined to the neck and does not radiate down the arm. There is no numbness, tingling, weakness in the arm. He's had a slight headache. He denies any pain in his chest, tightness, pressure, nausea, diaphoresis, or shortness of breath. He denies any injury to his neck. He does have arthritis involving many joints in his body.  Review of Systems:  Other than noted above, the patient denies any of the following symptoms: Constitutional:  No fever, chills, or sweats. ENT:  No nasal congestion, sore throat, or oral ulcerations or lesions. Neck:  No swelling, or adenopathy.  Full ROM without pain. Cardiac:  No chest pain, tightness, or pressure. Respiratory:  No cough, wheezing, or dyspnea. M-S:  No joint pain, muscle pain, or back problems. Neuro:  No muscle weakness, numbness or paresthesias.  PMFSH:  Past medical history, family history, social history, meds, and allergies were reviewed.  He is allergic to tramadol. He takes blood pressure medicine and Percocet. He has hypertension, arthritis, and a history of colon cancer which he feels was completely eradicated.  Physical Exam:   Vital signs:  BP 139/74  Pulse 77  Temp(Src) 98 F (36.7 C) (Oral)  Resp 18  SpO2 100% General:  Alert, oriented and in no distress. Eye:  PERRL, full EOMs. ENT:  Pharynx clear, no oral lesions. Neck:  He has pain to palpation along the right trapezius ridge. His neck has a very limited range of motion with just a few degrees of movement in all directions with pain. Lungs:  No respiratory distress.  Breath sounds clear and equal bilaterally.  No wheezes, rales or rhonchi. Heart:  Regular rhythm.  No gallops, murmers, or rubs. Ext:  No  upper extremity edema, pulses full.  Full ROM of joints with no joint or muscle pain to palpation. Neuro:  Alert and oriented times 3.  No focal muscle weakness.  DTRs symmetric.  Sensation intact to light touch. Skin: Clear, warm and dry.  No rash.  Good capillary refill.  Radiology:  Dg Cervical Spine Complete  12/09/2012   CLINICAL DATA:  Right-sided neck pain. No injury. No radiations.  EXAM: CERVICAL SPINE  4+ VIEWS  COMPARISON:  Cervical spine MRI 12/23/2008. Cervical spine radiographs 03/06/2010.  FINDINGS: There is loss of the normal cervical lordosis with reversal which is probably positional. Severe multilevel cervical spondylosis is present which has progressed slightly compared to the prior exam. Disc space loss is most pronounced at C3-C4. There is multilevel foraminal encroachment associated with uncovertebral spurring, most pronounced at C4-C5. The odontoid is suboptimally visualized. Grossly the odontoid appears intact. Cervicothoracic junction not visualized adequately. Prevertebral soft tissues appear within normal limits.  IMPRESSION: Severe multilevel cervical spondylosis. Reversal of the normal cervical lordosis is probably positional.   Electronically Signed   By: Andreas Newport M.D.   On: 12/09/2012 19:57    Course in Urgent Care Center:   Since he will be driving home he was given acetaminophen 650 mg by mouth.  Assessment:  The encounter diagnosis was Torticollis.  He appears to have acute torticollis superimposed on chronic degenerative joint and degenerative disc disease involving the cervical spine.  Plan:   1.  Meds:  The following meds were prescribed:   Discharge Medication List as of 12/09/2012  8:07 PM    START taking these medications   Details  methocarbamol (ROBAXIN) 500 MG tablet Take 1 tablet (500 mg total) by mouth 3 (three) times daily., Starting 12/09/2012, Until Discontinued, Normal    !! oxyCODONE-acetaminophen (PERCOCET) 5-325 MG per tablet 1 to 2  tablets every 6 hours as needed for pain., Print     !! - Potential duplicate medications found. Please discuss with provider.      2.  Patient Education/Counseling:  The patient was given appropriate handouts, self care instructions, and instructed in symptomatic relief.  He was given neck exercises to do.  3.  Follow up:  The patient was told to follow up if no better in 3 to 4 days, if becoming worse in any way, and given some red flag symptoms such as worsening pain or new neurological symptoms which would prompt immediate return.  Follow up here as necessary.     Reuben Likes, MD 12/09/12 [id number]

## 2012-12-09 NOTE — ED Notes (Signed)
Pt   Reports  Severe  Neck pain    Describes  A s  A  Throbbing   -  Pain is  Worse           On  Movement            He  denys  Any  Injury              He    Is  Sitting  Upright on  Exam  Table                 Speaking  In  Complete  sentances

## 2012-12-31 ENCOUNTER — Ambulatory Visit: Payer: PRIVATE HEALTH INSURANCE | Admitting: Family Medicine

## 2013-01-01 ENCOUNTER — Telehealth: Payer: Self-pay | Admitting: Family Medicine

## 2013-01-01 DIAGNOSIS — M1990 Unspecified osteoarthritis, unspecified site: Secondary | ICD-10-CM

## 2013-01-01 NOTE — Telephone Encounter (Signed)
Pt needs refill for percocet Please advise when ready for pick up

## 2013-01-02 MED ORDER — OXYCODONE-ACETAMINOPHEN 5-325 MG PO TABS: 1.0000 | ORAL_TABLET | Freq: Two times a day (BID) | ORAL | Status: DC | PRN

## 2013-01-02 NOTE — Addendum Note (Signed)
Addended by: Simone Curia T on: 01/02/2013 04:14 PM   Modules accepted: Orders

## 2013-01-02 NOTE — Telephone Encounter (Signed)
Refilled #60 with 0 refills to last 1 mo to get patient in to see Dr Konrad Dolores.  Leona Singleton, MD

## 2013-01-02 NOTE — Telephone Encounter (Signed)
Spoke to pt by phone who will be coming in to pick up Rx for Percocet OK for refill as pt w/ significan arthritic disease and is deemed non-surgical. Pt has already weaned to 5/325 (down from 10/325) Dr. Karie Schwalbe. To refill as she is in clinic Would like to see pt in office prior to next refill.  Shelly Flatten, MD Family Medicine PGY-3 01/02/2013, 3:36 PM

## 2013-01-07 ENCOUNTER — Encounter: Payer: Self-pay | Admitting: Family Medicine

## 2013-01-07 NOTE — Progress Notes (Signed)
Patient ID: Joel Donaldson, male   DOB: 09-29-1940, 72 y.o.   MRN: [id number]   Documentation for additional home services completed on pt behalf based on home evaluation by [employer] [facility] [employer] and in home CNA.   Shelly Flatten, MD Family Medicine PGY-3 01/07/2013, 3:59 PM

## 2013-01-12 ENCOUNTER — Ambulatory Visit: Payer: PRIVATE HEALTH INSURANCE | Admitting: Family Medicine

## 2013-01-13 ENCOUNTER — Encounter: Payer: Self-pay | Admitting: Family Medicine

## 2013-01-13 ENCOUNTER — Ambulatory Visit (INDEPENDENT_AMBULATORY_CARE_PROVIDER_SITE_OTHER): Payer: PRIVATE HEALTH INSURANCE | Admitting: Family Medicine

## 2013-01-13 VITALS — BP 134/80 | HR 101 | Temp 98.6°F | Ht 70.0 in | Wt 180.0 lb

## 2013-01-13 DIAGNOSIS — M1990 Unspecified osteoarthritis, unspecified site: Secondary | ICD-10-CM

## 2013-01-13 DIAGNOSIS — M069 Rheumatoid arthritis, unspecified: Secondary | ICD-10-CM

## 2013-01-13 DIAGNOSIS — I10 Essential (primary) hypertension: Secondary | ICD-10-CM

## 2013-01-13 MED ORDER — OXYCODONE-ACETAMINOPHEN 10-325 MG PO TABS: 1.0000 | ORAL_TABLET | Freq: Two times a day (BID) | ORAL | Status: DC | PRN

## 2013-01-13 NOTE — Patient Instructions (Addendum)
Blood pressure looked great again today! Keep taking your medicine, walking daily, and avoiding salt  For your shoulder pain, we have given you 3 months prescription. See Dr. Konrad Dolores at least 15 days before these run out.   Please see your Rheumatologist before the end of the year if you can.   You signed a pain contract today  See [place] in about 2.5 months,  Dr. Durene Cal  Health Maintenance Due  Topic Date Due  . Zostavax -you did not want this 04/29/1999  . Tetanus/tdap -you wanted to talk to Dr. Konrad Dolores about this since you think you had it 01/12/2013

## 2013-01-13 NOTE — Progress Notes (Signed)
Joel Conch, MD Phone: [phone]  Subjective:  Chief complaint-noted  # Hypertension BP Readings from Last 3 Encounters:  01/13/13 134/80  12/09/12 139/74  11/10/12 110/70  Home BP monitoring-no Compliant with medications-yes without side effects, lisinopril-hctz.  Denies any chest pain or shortness of breath. Not using nitroglycerin recently  # Chronic Right shoulder pain from DJD/OA Patient states colder months have been more difficult. Previously had weaned down to 5/325 percocet but has had to take 2 recently (asked patient to always call us if he feels he needs to adjust). He is doing his daily shoulder exercises.   Past Medical History-rheumatoid arthritis (has not seen rheumatologist in over a year and not currently taking methotrexate, osteoarthritis, DJD, intention tremor, COPD, hyperlipidemia, history of prostate cancer, hypertension, GERD  Medications- reviewed and updated Current Outpatient Prescriptions  Medication Sig Dispense Refill  . albuterol (PROVENTIL HFA;VENTOLIN HFA) 108 (90 BASE) MCG/ACT inhaler Inhale 2 puffs into the lungs every 6 (six) hours as needed for wheezing or shortness of breath.      . ALREX 0.2 % SUSP Place 2 drops into both eyes 2 (two) times daily.       Marland Kitchen aspirin 81 MG EC tablet Take 81 mg by mouth daily.       . diclofenac sodium (VOLTAREN) 1 % GEL Apply 2 g topically 4 (four) times daily as needed.  100 g  3  . lisinopril-hydrochlorothiazide (PRINZIDE,ZESTORETIC) 20-12.5 MG per tablet Take 1 tablet by mouth daily.  90 tablet  3  . methocarbamol (ROBAXIN) 500 MG tablet Take 1 tablet (500 mg total) by mouth 3 (three) times daily.  30 tablet  0  . nitroGLYCERIN (NITRODUR - DOSED IN MG/24 HR) 0.1 mg/hr Place onto the skin daily. 1/4 of a patch      . nitroGLYCERIN (NITROSTAT) 0.4 MG SL tablet Place 0.4 mg under the tongue every 5 (five) minutes as needed for chest pain.      Marland Kitchen omeprazole (PRILOSEC) 40 MG capsule Take 1 capsule (40 mg total)  by mouth every morning.  30 capsule  12  . oxyCODONE-acetaminophen (PERCOCET) 10-325 MG per tablet Take 1 tablet by mouth 2 (two) times daily as needed for pain. May fill 01/13/13  60 tablet  0  . oxyCODONE-acetaminophen (PERCOCET) 10-325 MG per tablet Take 1 tablet by mouth 2 (two) times daily as needed for pain. May fill 02/12/2013  60 tablet  0  . oxyCODONE-acetaminophen (PERCOCET) 10-325 MG per tablet Take 1 tablet by mouth 2 (two) times daily as needed for pain. May fill 03/15/13  60 tablet  0   No current facility-administered medications for this visit.    Objective: BP 134/80  Pulse 101  Temp(Src) 98.6 F (37 C) (Oral)  Ht 5\' 10"  (1.778 m)  Wt 180 lb (81.647 kg)  BMI 25.83 kg/m2 Gen: NAD, resting comfortably in chair  MSK: limited range of motion of right shoulder to 90 degrees without pain then can make full arc with arm but with pain. Left shoulder not limited.   Assessment/Plan:  HYPERTENSION, BENIGN SYSTEMIC Well controlled. Continue current meds: lisinopril-hctz. Counseled on low salt intake, regular exercise (patient already walks a mile daily).     Degenerative joint disease Patient initially tolerated 5/325 percocet but pain has increased. Patient tolerable of pain with 10/325 percocet. Discussed with Dr. Konrad Dolores who was ok with 3 months of #60 using BID (nonoperative and bone on bone per PCP). Patient will return 2 weeks before that visit. Pain contract  signed today. Would encourage random drug screen at next visit as not completed today.   Rheumatoid arthritis Not sure why patient stopped seeing rheumatology. He stopped his methotrexate over a year ago. Does have joint pain but this is thought to be OA. Have encouraged patient to make appointment by the end of the year.    Meds ordered this encounter  Medications  . oxyCODONE-acetaminophen (PERCOCET) 10-325 MG per tablet    Sig: Take 1 tablet by mouth 2 (two) times daily as needed for pain. May fill 01/13/13     Dispense:  60 tablet    Refill:  0  . oxyCODONE-acetaminophen (PERCOCET) 10-325 MG per tablet    Sig: Take 1 tablet by mouth 2 (two) times daily as needed for pain. May fill 02/12/2013    Dispense:  60 tablet    Refill:  0  . oxyCODONE-acetaminophen (PERCOCET) 10-325 MG per tablet    Sig: Take 1 tablet by mouth 2 (two) times daily as needed for pain. May fill 03/15/13    Dispense:  60 tablet    Refill:  0

## 2013-01-13 NOTE — Assessment & Plan Note (Signed)
Well controlled. Continue current meds: lisinopril-hctz. Counseled on low salt intake, regular exercise (patient already walks a mile daily).

## 2013-01-13 NOTE — Assessment & Plan Note (Signed)
Not sure why patient stopped seeing rheumatology. He stopped his methotrexate over a year ago. Does have joint pain but this is thought to be OA. Have encouraged patient to make appointment by the end of the year.

## 2013-01-13 NOTE — Assessment & Plan Note (Signed)
Patient initially tolerated 5/325 percocet but pain has increased. Patient tolerable of pain with 10/325 percocet. Discussed with Dr. Konrad Dolores who was ok with 3 months of #60 using BID (nonoperative and bone on bone per PCP). Patient will return 2 weeks before that visit. Pain contract signed today. Would encourage random drug screen at next visit as not completed today.

## 2013-04-08 ENCOUNTER — Ambulatory Visit (INDEPENDENT_AMBULATORY_CARE_PROVIDER_SITE_OTHER): Payer: PRIVATE HEALTH INSURANCE | Admitting: Family Medicine

## 2013-04-08 ENCOUNTER — Encounter: Payer: Self-pay | Admitting: Family Medicine

## 2013-04-08 VITALS — BP 149/93 | HR 109 | Temp 98.4°F | Ht 70.0 in | Wt 178.0 lb

## 2013-04-08 DIAGNOSIS — J449 Chronic obstructive pulmonary disease, unspecified: Secondary | ICD-10-CM

## 2013-04-08 DIAGNOSIS — M1990 Unspecified osteoarthritis, unspecified site: Secondary | ICD-10-CM

## 2013-04-08 DIAGNOSIS — K219 Gastro-esophageal reflux disease without esophagitis: Secondary | ICD-10-CM

## 2013-04-08 DIAGNOSIS — J4489 Other specified chronic obstructive pulmonary disease: Secondary | ICD-10-CM

## 2013-04-08 DIAGNOSIS — I10 Essential (primary) hypertension: Secondary | ICD-10-CM

## 2013-04-08 MED ORDER — OXYCODONE-ACETAMINOPHEN 10-325 MG PO TABS: 1.0000 | ORAL_TABLET | Freq: Two times a day (BID) | ORAL | Status: DC | PRN

## 2013-04-08 MED ORDER — OMEPRAZOLE 40 MG PO CPDR: 40.0000 mg | DELAYED_RELEASE_CAPSULE | Freq: Every morning | ORAL | Status: DC

## 2013-04-08 MED ORDER — LISINOPRIL-HYDROCHLOROTHIAZIDE 20-12.5 MG PO TABS: 1.0000 | ORAL_TABLET | Freq: Every day | ORAL | Status: DC

## 2013-04-08 NOTE — Assessment & Plan Note (Signed)
Typically at goal Pt at risk for fall  No change to regimen

## 2013-04-08 NOTE — Patient Instructions (Signed)
You are doing well overall Please come back in 3 months or sooner if needed

## 2013-04-08 NOTE — Assessment & Plan Note (Signed)
No change in pain regimen

## 2013-04-08 NOTE — Progress Notes (Signed)
Joel Donaldson is a 73 y.o. male who presents to [facility] today for Med refill  Severe arthritis bilat shoulders: Pt non srgical candidate. Taking Perc. Has tried multiple other modalities in past w/o help. Pain worse w/ weather changes (especially cold) but well controlled on current per regimen. No improvement in ROM but no worsening  GERD: well controlled on prilosec.   HTN: taking prinside. Denies CP, Palp, syncope   The following portions of the patient's history were reviewed and updated as appropriate: allergies, current medications, past medical history, family and social history, and problem list.  Patient is a nonsmoker.  Health Maintenance: UTD.   Past Medical History  Diagnosis Date  . Arthritis     hands, back, knees  . Anxiety   . Depression   . GERD (gastroesophageal reflux disease)   . Glaucoma   . Hypertension   . Prostate cancer   . Heart murmur asymptomatic  . Short of breath on exertion   . Asthma   . Nocturia   . Hx of radioisotope therapy 07/26/11    Radiosactive Prostate Seed Implant/ I-125 Seeds  . Chest pain at rest 12/19/2011  . Rheumatoid arthritis 10/10/2012    ROS as above otherwise neg.    Medications reviewed. Current Outpatient Prescriptions  Medication Sig Dispense Refill  . albuterol (PROVENTIL HFA;VENTOLIN HFA) 108 (90 BASE) MCG/ACT inhaler Inhale 2 puffs into the lungs every 6 (six) hours as needed for wheezing or shortness of breath.      . ALREX 0.2 % SUSP Place 2 drops into both eyes 2 (two) times daily.       Marland Kitchen aspirin 81 MG EC tablet Take 81 mg by mouth daily.       . diclofenac sodium (VOLTAREN) 1 % GEL Apply 2 g topically 4 (four) times daily as needed.  100 g  3  . lisinopril-hydrochlorothiazide (PRINZIDE,ZESTORETIC) 20-12.5 MG per tablet Take 1 tablet by mouth daily.  90 tablet  3  . methocarbamol (ROBAXIN) 500 MG tablet Take 1 tablet (500 mg total) by mouth 3 (three) times daily.  30 tablet  0  . nitroGLYCERIN (NITRODUR - DOSED IN  MG/24 HR) 0.1 mg/hr Place onto the skin daily. 1/4 of a patch      . nitroGLYCERIN (NITROSTAT) 0.4 MG SL tablet Place 0.4 mg under the tongue every 5 (five) minutes as needed for chest pain.      Marland Kitchen omeprazole (PRILOSEC) 40 MG capsule Take 1 capsule (40 mg total) by mouth every morning.  30 capsule  12  . oxyCODONE-acetaminophen (PERCOCET) 10-325 MG per tablet Take 1 tablet by mouth 2 (two) times daily as needed for pain. May fill 01/13/13  60 tablet  0  . oxyCODONE-acetaminophen (PERCOCET) 10-325 MG per tablet Take 1 tablet by mouth 2 (two) times daily as needed for pain. May fill 02/12/2013  60 tablet  0  . oxyCODONE-acetaminophen (PERCOCET) 10-325 MG per tablet Take 1 tablet by mouth 2 (two) times daily as needed for pain. May fill 03/15/13  60 tablet  0   No current facility-administered medications for this visit.    Exam:  BP 149/93  Pulse 109  Temp(Src) 98.4 F (36.9 C) (Oral)  Ht 5\' 10"  (1.778 m)  Wt 178 lb (80.74 kg)  BMI 25.54 kg/m2 Gen: Well NAD HEENT: EOMI,  MMM MSK: shoulders bilat. Limited ROM. No effusions  No results found for this or any previous visit (from the past 72 hour(s)).  A/P (as seen in  Problem list)  HYPERTENSION, BENIGN SYSTEMIC Typically at goal Pt at risk for fall  No change to regimen  GASTROESOPHAGEAL REFLUX, NO ESOPHAGITIS Refill pirlosec Good control  COPD No O2 requirment No inh needed other than occasional albuterol  Degenerative joint disease No change in pain regimen

## 2013-04-08 NOTE — Assessment & Plan Note (Signed)
No O2 requirment No inh needed other than occasional albuterol

## 2013-04-08 NOTE — Assessment & Plan Note (Signed)
Refill pirlosec Good control

## 2013-05-18 ENCOUNTER — Other Ambulatory Visit: Payer: Self-pay | Admitting: Family Medicine

## 2013-05-18 ENCOUNTER — Telehealth: Payer: Self-pay | Admitting: Family Medicine

## 2013-05-18 DIAGNOSIS — M1990 Unspecified osteoarthritis, unspecified site: Secondary | ICD-10-CM

## 2013-05-18 MED ORDER — OXYCODONE-ACETAMINOPHEN 10-325 MG PO TABS: 1.0000 | ORAL_TABLET | Freq: Two times a day (BID) | ORAL | Status: DC | PRN

## 2013-05-18 NOTE — Telephone Encounter (Signed)
Pt called and said that one of the rx's given for his Oxycodone was written for date of 01/15/09.  Pharmacy kept rx instead of giving back to patient to return to provider.  Patient now calling to say he need a new rx to replace that one.  Please contact him back to advise.

## 2013-05-18 NOTE — Telephone Encounter (Signed)
Not sure how this happened. Can you all have one of the providers fill this Rx for the pt (30 day supply w/o refill) Pt is very trustworthy and no problem w/ this refill as has true medical necessity Thanks

## 2013-05-18 NOTE — Telephone Encounter (Signed)
Asked Dr. Andria Frames who is precepting if he can write this rx for patient.  Rx was written and pt is aware that it is up front for pick up. Viktoria Gruetzmacher,CMA

## 2013-07-03 ENCOUNTER — Ambulatory Visit (INDEPENDENT_AMBULATORY_CARE_PROVIDER_SITE_OTHER): Payer: PRIVATE HEALTH INSURANCE | Admitting: Family Medicine

## 2013-07-03 ENCOUNTER — Encounter: Payer: Self-pay | Admitting: Family Medicine

## 2013-07-03 VITALS — BP 142/90 | HR 101 | Temp 98.5°F | Ht 70.0 in | Wt 180.0 lb

## 2013-07-03 DIAGNOSIS — Z0000 Encounter for general adult medical examination without abnormal findings: Secondary | ICD-10-CM

## 2013-07-03 DIAGNOSIS — Z23 Encounter for immunization: Secondary | ICD-10-CM

## 2013-07-03 DIAGNOSIS — E559 Vitamin D deficiency, unspecified: Secondary | ICD-10-CM

## 2013-07-03 DIAGNOSIS — I10 Essential (primary) hypertension: Secondary | ICD-10-CM

## 2013-07-03 DIAGNOSIS — M1990 Unspecified osteoarthritis, unspecified site: Secondary | ICD-10-CM

## 2013-07-03 DIAGNOSIS — K219 Gastro-esophageal reflux disease without esophagitis: Secondary | ICD-10-CM | POA: Insufficient documentation

## 2013-07-03 MED ORDER — OXYCODONE-ACETAMINOPHEN 10-325 MG PO TABS: 1.0000 | ORAL_TABLET | Freq: Two times a day (BID) | ORAL | Status: DC | PRN

## 2013-07-03 NOTE — Assessment & Plan Note (Signed)
Pt unable to come off PPI. On for several years Start VitD and Ca supplementation Vit D level today

## 2013-07-03 NOTE — Assessment & Plan Note (Signed)
At goal.  No change 

## 2013-07-03 NOTE — Patient Instructions (Addendum)
You are doing well overall Please continue taking all your medications as prescribed.  Please come back in 3 months or sooner if needed Please start taking 1000mg  of Calcium citrate daily

## 2013-07-03 NOTE — Progress Notes (Signed)
Joel Donaldson is a 73 y.o. male who presents to [facility] today for routine follow up  Asthma: using albuterol 2x daily. Not for flares, just thought he was supporsed to use it that way.   HTN: taking HCTZ and lisinopril daily for BP. No CP, palpitaitions, syncope, SOB. Has not needed nitro.   MSK pain: no longer using the voltaren gel or the robaxin. They were not helping the pain. Pain well controlled on 2 percocet a day.   GERD: taking prilosec daily. Tried to wean before but severe GI discomfort starts w/in 24 hrs. Pt aware of long term affects of PPI use. Encouraged to increase ca in diet.   RA: seeing Dr. Tomasita Crumble for RA. No recommendations or changes to medications by Dr. Tomasita Crumble per pt.   The following portions of the patient's history were reviewed and updated as appropriate: allergies, current medications, past medical history, family and social history, and problem list.  Patient is a nonsmoker.  Past Medical History  Diagnosis Date  . Arthritis     hands, back, knees  . Anxiety   . Depression   . GERD (gastroesophageal reflux disease)   . Glaucoma   . Hypertension   . Prostate cancer   . Heart murmur asymptomatic  . Short of breath on exertion   . Asthma   . Nocturia   . Hx of radioisotope therapy 07/26/11    Radiosactive Prostate Seed Implant/ I-125 Seeds  . Chest pain at rest 12/19/2011  . Rheumatoid arthritis 10/10/2012    ROS as above otherwise neg.    Medications reviewed. Current Outpatient Prescriptions  Medication Sig Dispense Refill  . albuterol (PROVENTIL HFA;VENTOLIN HFA) 108 (90 BASE) MCG/ACT inhaler Inhale 2 puffs into the lungs every 6 (six) hours as needed for wheezing or shortness of breath.      Marland Kitchen aspirin 81 MG EC tablet Take 81 mg by mouth daily.       Marland Kitchen lisinopril-hydrochlorothiazide (PRINZIDE,ZESTORETIC) 20-12.5 MG per tablet Take 1 tablet by mouth daily.  90 tablet  3  . omeprazole (PRILOSEC) 40 MG capsule Take 1 capsule (40 mg total) by mouth every  morning.  30 capsule  12  . oxyCODONE-acetaminophen (PERCOCET) 10-325 MG per tablet Take 1 tablet by mouth 2 (two) times daily as needed for pain. May fill 02/12/2013  60 tablet  0  . oxyCODONE-acetaminophen (PERCOCET) 10-325 MG per tablet Take 1 tablet by mouth 2 (two) times daily as needed for pain. May fill 03/15/13  60 tablet  0  . oxyCODONE-acetaminophen (PERCOCET) 10-325 MG per tablet Take 1 tablet by mouth 2 (two) times daily as needed for pain.  60 tablet  0  . ALREX 0.2 % SUSP Place 2 drops into both eyes 2 (two) times daily.       . nitroGLYCERIN (NITRODUR - DOSED IN MG/24 HR) 0.1 mg/hr Place onto the skin daily. 1/4 of a patch      . nitroGLYCERIN (NITROSTAT) 0.4 MG SL tablet Place 0.4 mg under the tongue every 5 (five) minutes as needed for chest pain.       No current facility-administered medications for this visit.    Exam: BP 142/90  Pulse 101  Temp(Src) 98.5 F (36.9 C) (Oral)  Ht 5\' 10"  (1.778 m)  Wt 180 lb (81.647 kg)  BMI 25.83 kg/m2 Gen: Well NAD HEENT: EOMI,  MMM Lungs: CTABL Nl WOB Heart: RRR no MRG Abd: NABS, NT, ND Exts: Non edematous BL  LE, warm and well  perfused.   No results found for this or any previous visit (from the past 72 hour(s)).  A/P (as seen in Problem list)  HYPERTENSION, BENIGN SYSTEMIC At goal. No change  Degenerative joint disease No change in opioid therapy Non operative candidate  Preventative health care Prevnar today Tdap Rx given   GERD (gastroesophageal reflux disease) Pt unable to come off PPI. On for several years Start VitD and Ca supplementation Vit D level today

## 2013-07-03 NOTE — Assessment & Plan Note (Signed)
Prevnar today Tdap Rx given

## 2013-07-03 NOTE — Assessment & Plan Note (Signed)
No change in opioid therapy Non operative candidate

## 2013-07-04 LAB — COMPREHENSIVE METABOLIC PANEL
ALK PHOS: 61 U/L (ref 39–117)
ALT: 20 U/L (ref 0–53)
AST: 22 U/L (ref 0–37)
Albumin: 3.8 g/dL (ref 3.5–5.2)
BILIRUBIN TOTAL: 0.3 mg/dL (ref 0.2–1.2)
BUN: 8 mg/dL (ref 6–23)
CO2: 30 mEq/L (ref 19–32)
CREATININE: 0.85 mg/dL (ref 0.50–1.35)
Calcium: 9.2 mg/dL (ref 8.4–10.5)
Chloride: 101 mEq/L (ref 96–112)
GLUCOSE: 104 mg/dL — AB (ref 70–99)
Potassium: 3.7 mEq/L (ref 3.5–5.3)
SODIUM: 141 meq/L (ref 135–145)
Total Protein: 6.8 g/dL (ref 6.0–8.3)

## 2013-07-04 LAB — VITAMIN D 25 HYDROXY (VIT D DEFICIENCY, FRACTURES): Vit D, 25-Hydroxy: 18 ng/mL — ABNORMAL LOW (ref 30–89)

## 2013-07-07 ENCOUNTER — Telehealth: Payer: Self-pay | Admitting: *Deleted

## 2013-07-07 DIAGNOSIS — E559 Vitamin D deficiency, unspecified: Secondary | ICD-10-CM | POA: Insufficient documentation

## 2013-07-07 MED ORDER — ERGOCALCIFEROL 1.25 MG (50000 UT) PO CAPS: 50000.0000 [IU] | ORAL_CAPSULE | ORAL | Status: DC

## 2013-07-07 NOTE — Assessment & Plan Note (Signed)
Start Vit D supplementation 50,000 Units Qwkly x 8 wks

## 2013-07-07 NOTE — Telephone Encounter (Signed)
Called patient to inform him of labs and prescription and he voiced understanding. Jackquelyn Sundberg,CMA

## 2013-07-07 NOTE — Telephone Encounter (Signed)
Message copied by Valerie Roys on Tue Jul 07, 2013  3:37 PM ------      Message from: [facility], DAVID J      Created: Tue Jul 07, 2013 12:16 PM       Please and cal to inform of low Vit D and supplementation ordered  ------

## 2013-07-07 NOTE — Addendum Note (Signed)
Addended by: Marily Memos, DAVID J on: 07/07/2013 12:16 PM   Modules accepted: Orders

## 2013-07-09 ENCOUNTER — Emergency Department (INDEPENDENT_AMBULATORY_CARE_PROVIDER_SITE_OTHER)
Admission: EM | Admit: 2013-07-09 | Discharge: 2013-07-09 | Disposition: A | Discharge status: Home / Self Care | Payer: PRIVATE HEALTH INSURANCE | Source: Home / Self Care | Attending: Emergency Medicine | Admitting: Emergency Medicine

## 2013-07-09 ENCOUNTER — Encounter (HOSPITAL_COMMUNITY): Payer: Self-pay | Admitting: Emergency Medicine

## 2013-07-09 DIAGNOSIS — M7120 Synovial cyst of popliteal space [Baker], unspecified knee: Secondary | ICD-10-CM

## 2013-07-09 DIAGNOSIS — M7050 Other bursitis of knee, unspecified knee: Secondary | ICD-10-CM

## 2013-07-09 MED ORDER — METHYLPREDNISOLONE ACETATE 40 MG/ML IJ SUSP
40.0000 mg | Freq: Once | INTRAMUSCULAR | Status: AC
  Administered 2013-07-09: 40 mg via INTRA_ARTICULAR

## 2013-07-09 MED ORDER — METHYLPREDNISOLONE ACETATE 40 MG/ML IJ SUSP
INTRAMUSCULAR | Status: AC
  Filled 2013-07-09: qty 5

## 2013-07-09 NOTE — Discharge Instructions (Signed)
Joel Donaldson Joel Donaldson is a sac-like structure that forms in the back of the knee. It is filled with the same fluid that is located in your knee. This fluid lubricates the bones and cartilage of the knee and allows them to move over each other more easily.  CAUSES   When the knee becomes injured or inflamed, increased fluid forms in the knee. When this happens, the joint lining is pushed out behind the knee and forms the Joel Donaldson Joel. This Joel may also be caused by inflammation from arthritic conditions and infections.  SIGNS AND SYMPTOMS   A Joel Donaldson Joel usually has no symptoms. When the Joel is substantially enlarged:  · You may feel pressure behind the knee, stiffness in the knee, or a mass in the area behind the knee.  · You may develop pain, redness, and swelling in the calf.  This can suggest a blood clot and requires evaluation by your health care provider.  DIAGNOSIS   A Joel Donaldson Joel is most often found during an ultrasound exam. This exam may have been performed for other reasons, and the Joel was found incidentally. Sometimes an MRI is used. This picks up other problems within a joint that an ultrasound exam may not. If the Joel Donaldson Joel developed immediately after an injury, X-ray exams may be used to diagnose the Joel.  TREATMENT   The treatment depends on the cause of the Joel. Anti-inflammatory medicines and rest often will be prescribed. If the Joel is caused by a bacterial infection, antibiotic medicines may be prescribed.   HOME CARE INSTRUCTIONS   · If the Joel was caused by an injury, for the first 24 hours, keep the injured leg elevated on 2 pillows while lying down.  · For the first 24 hours while you are awake, apply ice to the injured area:  · Put ice in a plastic bag.  · Place a towel between your skin and the bag.  · Leave the ice on for 20 minutes, 2 3 times a day.  · Only take over-the-counter or prescription medicines for pain, discomfort, or fever as directed by your health care  provider.  · Only take antibiotic medicine as directed. Make sure to finish it even if you start to feel better.  MAKE SURE YOU:   · Understand these instructions.  · Will watch your condition.  · Will get help right away if you are not doing well or get worse.  Document Released: 01/29/2005 Document Revised: 11/19/2012 Document Reviewed: 09/10/2012  [employer]® Patient Information ©2014 [employer].

## 2013-07-09 NOTE — ED Provider Notes (Signed)
CSN: [id number]     Arrival date & time 07/09/13  1256 History   None    Chief Complaint  Patient presents with  . Knee Pain   (Consider location/radiation/quality/duration/timing/severity/associated sxs/prior Treatment) HPI Comments: 73 year old male presents for evaluation of an area of tender swelling on the back of his right knee. This has been present for about one week. It is tender to touch and is somewhat painful to walk on that leg. He has a long history of osteoarthritis in that knee that has required steroid injections before. He has no recent injury. No history of DVT or PE. No swelling of the leg distal to this. No other symptoms at this time.  Patient is a 73 y.o. male presenting with knee pain.  Knee Pain   Past Medical History  Diagnosis Date  . Arthritis     hands, back, knees  . Anxiety   . Depression   . GERD (gastroesophageal reflux disease)   . Glaucoma   . Hypertension   . Prostate cancer   . Heart murmur asymptomatic  . Short of breath on exertion   . Asthma   . Nocturia   . Hx of radioisotope therapy 07/26/11    Radiosactive Prostate Seed Implant/ I-125 Seeds  . Chest pain at rest 12/19/2011  . Rheumatoid arthritis 10/10/2012   Past Surgical History  Procedure Laterality Date  . Shoulder surgery  YRS AGO    LEFT  . Lumbar fusion  1995  . Radioactive seed implant  09/14/2011    Procedure: RADIOACTIVE SEED IMPLANT;  Surgeon: Hanley Ben, MD;  Location: [facility];  Service: Urology;  Laterality: N/A;  Total number of seeds -    . Cystoscopy  09/14/2011    Procedure: CYSTOSCOPY FLEXIBLE;  Surgeon: Hanley Ben, MD;  Location: [facility];  Service: Urology;  Laterality: N/A;  No seeds seen in the bladder   Family History  Problem Relation Age of Onset  . Diabetes Mother    History  Substance Use Topics  . Smoking status: Former Smoker -- 2.00 packs/day for 50 years    Types: Cigarettes    Quit date: 10/13/2004   . Smokeless tobacco: Never Used     Comment: Still not smoking.   . Alcohol Use: 8.4 oz/week    14 Shots of liquor per week     Comment: occassional    Review of Systems  Musculoskeletal: Positive for arthralgias.       Tender swelling behind right knee  All other systems reviewed and are negative.   Allergies  Tramadol  Home Medications   Prior to Admission medications   Medication Sig Start Date End Date Taking? Authorizing Provider  albuterol (PROVENTIL HFA;VENTOLIN HFA) 108 (90 BASE) MCG/ACT inhaler Inhale 2 puffs into the lungs every 6 (six) hours as needed for wheezing or shortness of breath. 04/08/12   Carolin Guernsey, MD  ALREX 0.2 % SUSP Place 2 drops into both eyes 2 (two) times daily.  02/22/12   Historical Provider, MD  aspirin 81 MG EC tablet Take 81 mg by mouth daily.     Historical Provider, MD  ergocalciferol (VITAMIN D2) 50000 UNITS capsule Take 1 capsule (50,000 Units total) by mouth once a week. 07/07/13   Waldemar Dickens, MD  lisinopril-hydrochlorothiazide (PRINZIDE,ZESTORETIC) 20-12.5 MG per tablet Take 1 tablet by mouth daily. 04/08/13   Waldemar Dickens, MD  nitroGLYCERIN (NITRODUR - DOSED IN MG/24 HR) 0.1 mg/hr Place onto the  skin daily. 1/4 of a patch 02/08/12   Carolin Guernsey, MD  nitroGLYCERIN (NITROSTAT) 0.4 MG SL tablet Place 0.4 mg under the tongue every 5 (five) minutes as needed for chest pain. 12/20/11   [place], MD  omeprazole (PRILOSEC) 40 MG capsule Take 1 capsule (40 mg total) by mouth every morning. 04/08/13   Waldemar Dickens, MD  oxyCODONE-acetaminophen (PERCOCET) 10-325 MG per tablet Take 1 tablet by mouth 2 (two) times daily as needed for pain. May fill 02/12/2013 07/03/13   Waldemar Dickens, MD  oxyCODONE-acetaminophen (PERCOCET) 10-325 MG per tablet Take 1 tablet by mouth 2 (two) times daily as needed for pain. May fill 03/15/13 07/03/13   Waldemar Dickens, MD  oxyCODONE-acetaminophen (PERCOCET) 10-325 MG per tablet Take 1 tablet by mouth 2  (two) times daily as needed for pain. 07/03/13   Waldemar Dickens, MD   BP 120/77  Pulse 95  Temp(Src) 99 F (37.2 C) (Oral)  Resp 16  SpO2 99% Physical Exam  Nursing note and vitals reviewed. Constitutional: He is oriented to person, place, and time. He appears well-developed and well-nourished. No distress.  HENT:  Head: Normocephalic.  Cardiovascular:  Pulses:      Dorsalis pedis pulses are 2+ on the right side.  Pulmonary/Chest: Effort normal. No respiratory distress.  Musculoskeletal:       Right knee: He exhibits swelling (Lakeria Starkman's cyst behind the right knee) and effusion (Minimal). He exhibits normal range of motion, no deformity and no erythema. Tenderness (Bakers cyst) found.  Bilateral knees are enlarged consistent with long-standing osteoarthritis.  Neurological: He is alert and oriented to person, place, and time. Coordination normal.  Skin: Skin is warm and dry. No rash noted. He is not diaphoretic.  Psychiatric: He has a normal mood and affect. Judgment normal.    ED Course  Injection of joint Date/Time: 07/09/2013 4:52 PM Performed by: Allena Katz, H Authorized by: Allena Katz, H Consent: Verbal consent obtained. Risks and benefits: risks, benefits and alternatives were discussed Consent given by: patient Site marked: the operative site was marked Patient identity confirmed: verbally with patient Time out: Immediately prior to procedure a "time out" was called to verify the correct patient, procedure, equipment, support staff and site/side marked as required. Preparation: Patient was prepped and draped in the usual sterile fashion. Local anesthesia used: yes Local anesthetic: topical anesthetic Patient sedated: no Patient tolerance: Patient tolerated the procedure well with no immediate complications.   (including critical care time) Labs Review Labs Reviewed - No data to display  Imaging Review No results found.   MDM   1. Popliteal bursitis     Risks and benefits explained to the patient, he elects to go ahead with the injection. Injection into the right knee with 40 mg of Depo-Medrol and 3 mg of 2% lidocaine, feeling better symptomatically immediately post injection. Follow up with primary care in a few days.  Meds ordered this encounter  Medications  . methylPREDNISolone acetate (DEPO-MEDROL) injection 40 mg    Sig:        Liam Graham, PA-C 07/09/13 1654

## 2013-07-09 NOTE — ED Provider Notes (Signed)
Medical screening examination/treatment/procedure(s) were performed by non-physician practitioner and as supervising physician I was immediately available for consultation/collaboration.  Philipp Deputy, M.D.  Harden Mo, MD 07/09/13 2106

## 2013-07-09 NOTE — ED Notes (Signed)
Pt  Has      Pain  Behind the  r  Knee     Worse  When  He  Bends  The  Affected  Knee         And when  He  Bears   Weight on the  Knee          He  denys   Any   specefic injury

## 2013-08-12 ENCOUNTER — Telehealth: Payer: Self-pay | Admitting: *Deleted

## 2013-08-12 NOTE — Telephone Encounter (Signed)
LM for patient to call back and please schedule an annual wellness visit with Vinnie Level.  Thanks [employer]

## 2013-08-19 ENCOUNTER — Emergency Department (INDEPENDENT_AMBULATORY_CARE_PROVIDER_SITE_OTHER)
Admission: EM | Admit: 2013-08-19 | Discharge: 2013-08-19 | Disposition: A | Discharge status: Home / Self Care | Payer: PRIVATE HEALTH INSURANCE | Source: Home / Self Care | Attending: Family Medicine | Admitting: Family Medicine

## 2013-08-19 ENCOUNTER — Encounter (HOSPITAL_COMMUNITY): Payer: Self-pay | Admitting: Emergency Medicine

## 2013-08-19 DIAGNOSIS — E860 Dehydration: Secondary | ICD-10-CM

## 2013-08-19 NOTE — ED Notes (Signed)
C/o blood pressure States his machine is not working States he did get dizzy  States he has some sinus/nasal drainage

## 2013-08-19 NOTE — ED Provider Notes (Signed)
CSN: [id number]     Arrival date & time 08/19/13  1815 History   First MD Initiated Contact with Patient 08/19/13 1858     Chief Complaint  Patient presents with  . Hypertension   (Consider location/radiation/quality/duration/timing/severity/associated sxs/prior Treatment) Patient is a 73 y.o. male presenting with hypertension. The history is provided by the patient.  Hypertension This is a new problem. The current episode started 3 to 5 hours ago. The problem has been resolved. Associated symptoms include headaches. Pertinent negatives include no shortness of breath. Associated symptoms comments: Had sl brief pressure in head and lightheaded feeling, today, resolved..    Past Medical History  Diagnosis Date  . Arthritis     hands, back, knees  . Anxiety   . Depression   . GERD (gastroesophageal reflux disease)   . Glaucoma   . Hypertension   . Prostate cancer   . Heart murmur asymptomatic  . Short of breath on exertion   . Asthma   . Nocturia   . Hx of radioisotope therapy 07/26/11    Radiosactive Prostate Seed Implant/ I-125 Seeds  . Chest pain at rest 12/19/2011  . Rheumatoid arthritis 10/10/2012   Past Surgical History  Procedure Laterality Date  . Shoulder surgery  YRS AGO    LEFT  . Lumbar fusion  1995  . Radioactive seed implant  09/14/2011    Procedure: RADIOACTIVE SEED IMPLANT;  Surgeon: Hanley Ben, MD;  Location: [facility];  Service: Urology;  Laterality: N/A;  Total number of seeds -    . Cystoscopy  09/14/2011    Procedure: CYSTOSCOPY FLEXIBLE;  Surgeon: Hanley Ben, MD;  Location: [facility];  Service: Urology;  Laterality: N/A;  No seeds seen in the bladder   Family History  Problem Relation Age of Onset  . Diabetes Mother    History  Substance Use Topics  . Smoking status: Former Smoker -- 2.00 packs/day for 50 years    Types: Cigarettes    Quit date: 10/13/2004  . Smokeless tobacco: Never Used     Comment: Still  not smoking.   . Alcohol Use: 8.4 oz/week    14 Shots of liquor per week     Comment: occassional    Review of Systems  Constitutional: Negative.   Respiratory: Negative for shortness of breath.   Cardiovascular: Negative.   Gastrointestinal: Negative.   Neurological: Positive for dizziness and headaches. Negative for weakness.    Allergies  Tramadol  Home Medications   Prior to Admission medications   Medication Sig Start Date End Date Taking? Authorizing Provider  albuterol (PROVENTIL HFA;VENTOLIN HFA) 108 (90 BASE) MCG/ACT inhaler Inhale 2 puffs into the lungs every 6 (six) hours as needed for wheezing or shortness of breath. 04/08/12   Carolin Guernsey, MD  ALREX 0.2 % SUSP Place 2 drops into both eyes 2 (two) times daily.  02/22/12   Historical Provider, MD  aspirin 81 MG EC tablet Take 81 mg by mouth daily.     Historical Provider, MD  ergocalciferol (VITAMIN D2) 50000 UNITS capsule Take 1 capsule (50,000 Units total) by mouth once a week. 07/07/13   Waldemar Dickens, MD  lisinopril-hydrochlorothiazide (PRINZIDE,ZESTORETIC) 20-12.5 MG per tablet Take 1 tablet by mouth daily. 04/08/13   Waldemar Dickens, MD  nitroGLYCERIN (NITRODUR - DOSED IN MG/24 HR) 0.1 mg/hr Place onto the skin daily. 1/4 of a patch 02/08/12   Carolin Guernsey, MD  nitroGLYCERIN (NITROSTAT) 0.4 MG SL  tablet Place 0.4 mg under the tongue every 5 (five) minutes as needed for chest pain. 12/20/11   [place], MD  omeprazole (PRILOSEC) 40 MG capsule Take 1 capsule (40 mg total) by mouth every morning. 04/08/13   Waldemar Dickens, MD  oxyCODONE-acetaminophen (PERCOCET) 10-325 MG per tablet Take 1 tablet by mouth 2 (two) times daily as needed for pain. May fill 02/12/2013 07/03/13   Waldemar Dickens, MD  oxyCODONE-acetaminophen (PERCOCET) 10-325 MG per tablet Take 1 tablet by mouth 2 (two) times daily as needed for pain. May fill 03/15/13 07/03/13   Waldemar Dickens, MD  oxyCODONE-acetaminophen (PERCOCET) 10-325 MG per  tablet Take 1 tablet by mouth 2 (two) times daily as needed for pain. 07/03/13   Waldemar Dickens, MD   BP 118/78  Pulse 98  Temp(Src) 98.2 F (36.8 C)  Resp 20  SpO2 98% Physical Exam  Nursing note and vitals reviewed. Constitutional: He is oriented to person, place, and time. He appears well-developed and well-nourished.  Neck: Normal range of motion. Neck supple.  Cardiovascular: Regular rhythm and normal heart sounds.   Pulmonary/Chest: Effort normal and breath sounds normal.  Abdominal: Soft. Bowel sounds are normal. There is no tenderness.  Musculoskeletal: He exhibits no edema.  Lymphadenopathy:    He has no cervical adenopathy.  Neurological: He is alert and oriented to person, place, and time.  Skin: Skin is warm and dry.    ED Course  Procedures (including critical care time) Labs Review Labs Reviewed - No data to display  Imaging Review No results found.   MDM   1. Mild dehydration        Billy Fischer, MD 08/19/13 [phone]

## 2013-08-19 NOTE — Discharge Instructions (Signed)
Drink plenty of water, recheck your bp monitor and see your doctor if further problems. Marland Kitchen

## 2013-09-16 ENCOUNTER — Ambulatory Visit (INDEPENDENT_AMBULATORY_CARE_PROVIDER_SITE_OTHER): Payer: PRIVATE HEALTH INSURANCE | Admitting: Family Medicine

## 2013-09-16 ENCOUNTER — Encounter: Payer: Self-pay | Admitting: Family Medicine

## 2013-09-16 VITALS — BP 108/71 | HR 99 | Temp 98.4°F | Wt 177.0 lb

## 2013-09-16 DIAGNOSIS — M150 Primary generalized (osteo)arthritis: Principal | ICD-10-CM

## 2013-09-16 DIAGNOSIS — I10 Essential (primary) hypertension: Secondary | ICD-10-CM

## 2013-09-16 DIAGNOSIS — M0520 Rheumatoid vasculitis with rheumatoid arthritis of unspecified site: Secondary | ICD-10-CM

## 2013-09-16 DIAGNOSIS — M159 Polyosteoarthritis, unspecified: Secondary | ICD-10-CM

## 2013-09-16 DIAGNOSIS — I7789 Other specified disorders of arteries and arterioles: Secondary | ICD-10-CM

## 2013-09-16 MED ORDER — OXYCODONE-ACETAMINOPHEN 10-325 MG PO TABS: 1.0000 | ORAL_TABLET | Freq: Two times a day (BID) | ORAL | Status: DC | PRN

## 2013-09-16 MED ORDER — ALBUTEROL SULFATE HFA 108 (90 BASE) MCG/ACT IN AERS: 2.0000 | INHALATION_SPRAY | Freq: Four times a day (QID) | RESPIRATORY_TRACT | Status: DC | PRN

## 2013-09-16 MED ORDER — ZOSTER VACCINE LIVE 19400 UNT/0.65ML ~~LOC~~ SOLR: 0.6500 mL | Freq: Once | SUBCUTANEOUS | Status: DC

## 2013-09-16 NOTE — Progress Notes (Signed)
Patient ID: Joel Donaldson, male   DOB: 1940/08/27, 73 y.o.   MRN: [id number]   [facility] Bernadene Bell, MD Phone: [phone]  Subjective:  Joel Donaldson is a 73 y.o M who presents today for f/up  # HTN HYPERTENSION Disease Monitoring: Blood pressure range-100s-190s highest (but not in last few months) Chest pain, palpitations- denies     Dyspnea- denies Medications:-taking hctz and lisinopril daily  Compliance-  Lightheadedness,Syncope- denies  Edema- assc with knee   #DJD- pain mostly in knees and also in shoulder  -follows with Dr. Tomasita Crumble for RA -knows that pain wakes him up at night  -stable on perc X2 -interested in renewing handicap parking  All relevant systems were reviewed and were negative unless otherwise noted in the HPI  Past Medical History Patient Active Problem List   Diagnosis Date Noted  . Unspecified vitamin D deficiency 07/07/2013  . GERD (gastroesophageal reflux disease) 07/03/2013  . Resting tremor 11/10/2012  . Rheumatoid arthritis 10/10/2012  . Degenerative joint disease 02/15/2012  . Tendinopathy of rotator cuff 01/20/2012  . Preventative health care 12/03/2011  . Osteoarthritis of both knees 07/02/2011  . Alcohol use 05/01/2010  . INTENTION TREMOR 09/09/2008  . Prostate cancer 07/01/2008  . HYPERLIPIDEMIA 06/24/2007  . COPD 05/12/2007  . MIGRAINE, UNSPEC., W/O INTRACTABLE MIGRAINE 04/11/2006  . HYPERTENSION, BENIGN SYSTEMIC 04/11/2006  . GASTROESOPHAGEAL REFLUX, NO ESOPHAGITIS 04/11/2006   Reviewed problem list.  Medications- reviewed and updated Chief complaint-noted No additions to family history Social history- patient is a former smoker  Objective: BP 108/71  Pulse 99  Temp(Src) 98.4 F (36.9 C) (Oral)  Wt 177 lb (80.287 kg) Gen: NAD, alert, cooperative with exam HEENT: NCAT, EOMI, PERRL, TMs nml Neck: FROM, supple CV: RRR, good S1/S2, no murmur, cap refill <3 Resp: CTABL, no wheezes, non-labored Ext:  Bony defomities 2/2 RA including ulnar deviation, bilat knee effusions and A-C joint tenderness  Neuro: Alert and oriented,tremor intention  Assessment/Plan: See problem based a/p

## 2013-09-16 NOTE — Patient Instructions (Signed)
Joel Donaldson it was great to see you today!  I am pleased to hear that things are going well for you. Keep up the good work with your blood pressure  Let us know if you start to feel dizzy and we can decrease the dose  Continue to take the percocet for your knee pain Keep appointment with Dr. Tomasita Crumble. Let us know if your knees are swollen red or irritated ever and we are happy to pull some fluid off.  Looking forward to seeing you soon Bernadene Bell, MD

## 2013-09-16 NOTE — Assessment & Plan Note (Signed)
Currently at goal Cont current meds Could even decrease but pt not interested at this time

## 2013-09-16 NOTE — Assessment & Plan Note (Signed)
RA Currently stable Not an operative candidate Cont current therapy Refilled perc X3 months

## 2013-11-04 ENCOUNTER — Emergency Department (HOSPITAL_COMMUNITY)
Admission: EM | Admit: 2013-11-04 | Discharge: 2013-11-04 | Disposition: A | Discharge status: Home / Self Care | Payer: PRIVATE HEALTH INSURANCE | Attending: Emergency Medicine | Admitting: Emergency Medicine

## 2013-11-04 ENCOUNTER — Encounter (HOSPITAL_COMMUNITY): Payer: Self-pay | Admitting: Emergency Medicine

## 2013-11-04 ENCOUNTER — Emergency Department (HOSPITAL_COMMUNITY): Payer: PRIVATE HEALTH INSURANCE

## 2013-11-04 DIAGNOSIS — Z87891 Personal history of nicotine dependence: Secondary | ICD-10-CM | POA: Diagnosis not present

## 2013-11-04 DIAGNOSIS — Z7982 Long term (current) use of aspirin: Secondary | ICD-10-CM | POA: Diagnosis not present

## 2013-11-04 DIAGNOSIS — R011 Cardiac murmur, unspecified: Secondary | ICD-10-CM | POA: Diagnosis not present

## 2013-11-04 DIAGNOSIS — H409 Unspecified glaucoma: Secondary | ICD-10-CM | POA: Diagnosis not present

## 2013-11-04 DIAGNOSIS — M069 Rheumatoid arthritis, unspecified: Secondary | ICD-10-CM | POA: Diagnosis not present

## 2013-11-04 DIAGNOSIS — Z8659 Personal history of other mental and behavioral disorders: Secondary | ICD-10-CM | POA: Diagnosis not present

## 2013-11-04 DIAGNOSIS — M129 Arthropathy, unspecified: Secondary | ICD-10-CM | POA: Diagnosis not present

## 2013-11-04 DIAGNOSIS — R1033 Periumbilical pain: Secondary | ICD-10-CM | POA: Insufficient documentation

## 2013-11-04 DIAGNOSIS — K219 Gastro-esophageal reflux disease without esophagitis: Secondary | ICD-10-CM | POA: Diagnosis not present

## 2013-11-04 DIAGNOSIS — Z8546 Personal history of malignant neoplasm of prostate: Secondary | ICD-10-CM | POA: Diagnosis not present

## 2013-11-04 DIAGNOSIS — R1031 Right lower quadrant pain: Secondary | ICD-10-CM

## 2013-11-04 DIAGNOSIS — J45909 Unspecified asthma, uncomplicated: Secondary | ICD-10-CM | POA: Insufficient documentation

## 2013-11-04 DIAGNOSIS — Z79899 Other long term (current) drug therapy: Secondary | ICD-10-CM | POA: Diagnosis not present

## 2013-11-04 DIAGNOSIS — Z923 Personal history of irradiation: Secondary | ICD-10-CM | POA: Insufficient documentation

## 2013-11-04 DIAGNOSIS — I10 Essential (primary) hypertension: Secondary | ICD-10-CM | POA: Insufficient documentation

## 2013-11-04 LAB — COMPREHENSIVE METABOLIC PANEL
ALBUMIN: 3.5 g/dL (ref 3.5–5.2)
ALT: 14 U/L (ref 0–53)
ANION GAP: 9 (ref 5–15)
AST: 17 U/L (ref 0–37)
Alkaline Phosphatase: 60 U/L (ref 39–117)
BUN: 14 mg/dL (ref 6–23)
CHLORIDE: 102 meq/L (ref 96–112)
CO2: 28 mEq/L (ref 19–32)
Calcium: 8.7 mg/dL (ref 8.4–10.5)
Creatinine, Ser: 1.17 mg/dL (ref 0.50–1.35)
GFR calc Af Amer: 69 mL/min — ABNORMAL LOW (ref >90)
GFR, EST NON AFRICAN AMERICAN: 60 mL/min — AB (ref >90)
Glucose, Bld: 109 mg/dL — ABNORMAL HIGH (ref 70–99)
Potassium: 3.8 mEq/L (ref 3.7–5.3)
Sodium: 139 mEq/L (ref 137–147)
Total Bilirubin: 0.5 mg/dL (ref 0.3–1.2)
Total Protein: 6.6 g/dL (ref 6.0–8.3)

## 2013-11-04 LAB — URINALYSIS, ROUTINE W REFLEX MICROSCOPIC
Bilirubin Urine: NEGATIVE
Glucose, UA: NEGATIVE mg/dL
Hgb urine dipstick: NEGATIVE
KETONES UR: NEGATIVE mg/dL
LEUKOCYTES UA: NEGATIVE
Nitrite: NEGATIVE
PH: 7 (ref 5.0–8.0)
PROTEIN: NEGATIVE mg/dL
Specific Gravity, Urine: 1.021 (ref 1.005–1.030)
UROBILINOGEN UA: 1 mg/dL (ref 0.0–1.0)

## 2013-11-04 LAB — CBC WITH DIFFERENTIAL/PLATELET
BASOS PCT: 0 % (ref 0–1)
Basophils Absolute: 0 10*3/uL (ref 0.0–0.1)
Eosinophils Absolute: 0 10*3/uL (ref 0.0–0.7)
Eosinophils Relative: 1 % (ref 0–5)
HEMATOCRIT: 38.4 % — AB (ref 39.0–52.0)
HEMOGLOBIN: 12.8 g/dL — AB (ref 13.0–17.0)
Lymphocytes Relative: 15 % (ref 12–46)
Lymphs Abs: 0.6 10*3/uL — ABNORMAL LOW (ref 0.7–4.0)
MCH: 28.6 pg (ref 26.0–34.0)
MCHC: 33.3 g/dL (ref 30.0–36.0)
MCV: 85.7 fL (ref 78.0–100.0)
MONO ABS: 0.6 10*3/uL (ref 0.1–1.0)
Monocytes Relative: 16 % — ABNORMAL HIGH (ref 3–12)
NEUTROS ABS: 2.4 10*3/uL (ref 1.7–7.7)
Neutrophils Relative %: 68 % (ref 43–77)
Platelets: 218 10*3/uL (ref 150–400)
RBC: 4.48 MIL/uL (ref 4.22–5.81)
RDW: 14.6 % (ref 11.5–15.5)
WBC: 3.6 10*3/uL — ABNORMAL LOW (ref 4.0–10.5)

## 2013-11-04 MED ORDER — IOHEXOL 300 MG/ML  SOLN
100.0000 mL | Freq: Once | INTRAMUSCULAR | Status: AC | PRN
  Administered 2013-11-04: 100 mL via INTRAVENOUS

## 2013-11-04 MED ORDER — SODIUM CHLORIDE 0.9 % IV SOLN
INTRAVENOUS | Status: DC
  Administered 2013-11-04: 10:00:00 via INTRAVENOUS

## 2013-11-04 MED ORDER — ONDANSETRON HCL 4 MG/2ML IJ SOLN
4.0000 mg | Freq: Once | INTRAMUSCULAR | Status: AC
  Administered 2013-11-04: 4 mg via INTRAVENOUS
  Filled 2013-11-04: qty 2

## 2013-11-04 MED ORDER — OXYCODONE-ACETAMINOPHEN 5-325 MG PO TABS: 1.0000 | ORAL_TABLET | ORAL | Status: DC | PRN

## 2013-11-04 MED ORDER — MORPHINE SULFATE 4 MG/ML IJ SOLN
4.0000 mg | Freq: Once | INTRAMUSCULAR | Status: AC
  Administered 2013-11-04: 4 mg via INTRAVENOUS
  Filled 2013-11-04: qty 1

## 2013-11-04 NOTE — ED Notes (Signed)
Pt unable to provide urine specimen at this time, urinal given to pt.

## 2013-11-04 NOTE — ED Notes (Signed)
Patient transported to CT 

## 2013-11-04 NOTE — Discharge Instructions (Signed)

## 2013-11-04 NOTE — ED Notes (Signed)
Pt reports right sided abdominal pain 7/10 since last night. Denies n/v/d. Reports ETOH daily. None today.

## 2013-11-04 NOTE — ED Notes (Signed)
Bed: WA02 Expected date:  Expected time:  Means of arrival:  Comments: 

## 2013-11-04 NOTE — ED Notes (Addendum)
Pt c/o of right side abd pain radiating to back,pt states pain gradually increased since last night. Pt states nauseas at times but denies urinary diffculties, diarrhea, or constipation. Pt denies vomiting. Pt states he drinks daily but not today.

## 2013-11-04 NOTE — ED Provider Notes (Signed)
CSN: [id number]     Arrival date & time 11/04/13  0906 History   First MD Initiated Contact with Patient 11/04/13 [phone]     Chief Complaint  Patient presents with  . Abdominal Pain     (Consider location/radiation/quality/duration/timing/severity/associated sxs/prior Treatment) HPI Comments: Patient here complaining of right lower quadrant pain that began yesterday. Pain was initially periumbilical and has migrated to his right lower quadrant. Characterized as sharp and worse with movement. Denies any nausea vomiting diarrhea. No fever or chills. Symptoms are persistent and no treatment used prior to arrival. He denies any urinary symptoms. Nothing makes her symptoms better. Denies any rashes to his flank area. No prior abdominal surgical history  Patient is a 73 y.o. male presenting with abdominal pain. The history is provided by the patient and the spouse.  Abdominal Pain   Past Medical History  Diagnosis Date  . Arthritis     hands, back, knees  . Anxiety   . Depression   . GERD (gastroesophageal reflux disease)   . Glaucoma   . Hypertension   . Prostate cancer   . Heart murmur asymptomatic  . Short of breath on exertion   . Asthma   . Nocturia   . Hx of radioisotope therapy 07/26/11    Radiosactive Prostate Seed Implant/ I-125 Seeds  . Chest pain at rest 12/19/2011  . Rheumatoid arthritis 10/10/2012   Past Surgical History  Procedure Laterality Date  . Shoulder surgery  YRS AGO    LEFT  . Lumbar fusion  1995  . Radioactive seed implant  09/14/2011    Procedure: RADIOACTIVE SEED IMPLANT;  Surgeon: Hanley Ben, MD;  Location: [facility];  Service: Urology;  Laterality: N/A;  Total number of seeds -    . Cystoscopy  09/14/2011    Procedure: CYSTOSCOPY FLEXIBLE;  Surgeon: Hanley Ben, MD;  Location: [facility];  Service: Urology;  Laterality: N/A;  No seeds seen in the bladder   Family History  Problem Relation Age of Onset  . Diabetes  Mother    History  Substance Use Topics  . Smoking status: Former Smoker -- 2.00 packs/day for 50 years    Types: Cigarettes    Quit date: 10/13/2004  . Smokeless tobacco: Never Used     Comment: Still not smoking.   . Alcohol Use: 8.4 oz/week    14 Shots of liquor per week     Comment: occassional    Review of Systems  Gastrointestinal: Positive for abdominal pain.  All other systems reviewed and are negative.     Allergies  Tramadol  Home Medications   Prior to Admission medications   Medication Sig Start Date End Date Taking? Authorizing Provider  albuterol (PROVENTIL HFA;VENTOLIN HFA) 108 (90 BASE) MCG/ACT inhaler Inhale 2 puffs into the lungs every 6 (six) hours as needed for wheezing or shortness of breath. 09/16/13   Bernadene Bell, MD  ALREX 0.2 % SUSP Place 2 drops into both eyes 2 (two) times daily.  02/22/12   Historical Provider, MD  aspirin 81 MG EC tablet Take 81 mg by mouth daily.     Historical Provider, MD  ergocalciferol (VITAMIN D2) 50000 UNITS capsule Take 1 capsule (50,000 Units total) by mouth once a week. 07/07/13   Waldemar Dickens, MD  lisinopril-hydrochlorothiazide (PRINZIDE,ZESTORETIC) 20-12.5 MG per tablet Take 1 tablet by mouth daily. 04/08/13   Waldemar Dickens, MD  nitroGLYCERIN (NITRODUR - DOSED IN MG/24 HR) 0.1 mg/hr Place onto  the skin daily. 1/4 of a patch 02/08/12   Carolin Guernsey, MD  nitroGLYCERIN (NITROSTAT) 0.4 MG SL tablet Place 0.4 mg under the tongue every 5 (five) minutes as needed for chest pain. 12/20/11   Braddock, MD  omeprazole (PRILOSEC) 40 MG capsule Take 1 capsule (40 mg total) by mouth every morning. 04/08/13   Waldemar Dickens, MD  oxyCODONE-acetaminophen (PERCOCET) 10-325 MG per tablet Take 1 tablet by mouth 2 (two) times daily as needed for pain. May fill 02/12/2013 09/16/13   Bernadene Bell, MD  oxyCODONE-acetaminophen (PERCOCET) 10-325 MG per tablet Take 1 tablet by mouth 2 (two) times daily as needed for pain. May fill  10/17/13 09/16/13   Bernadene Bell, MD  oxyCODONE-acetaminophen (PERCOCET) 10-325 MG per tablet Take 1 tablet by mouth 2 (two) times daily as needed for pain. May fill 11/16/13 09/16/13   Bernadene Bell, MD  zoster vaccine live, PF, (ZOSTAVAX) 63016 UNT/0.65ML injection Inject 19,400 Units into the skin once. 09/16/13   Bernadene Bell, MD   BP 145/92  Pulse 88  Temp(Src) 98.5 F (36.9 C) (Oral)  Resp 16  SpO2 100% Physical Exam  Nursing note and vitals reviewed. Constitutional: He is oriented to person, place, and time. He appears well-developed and well-nourished.  Non-toxic appearance. No distress.  HENT:  Head: Normocephalic and atraumatic.  Eyes: Conjunctivae, EOM and lids are normal. Pupils are equal, round, and reactive to light.  Neck: Normal range of motion. Neck supple. No tracheal deviation present. No mass present.  Cardiovascular: Normal rate, regular rhythm and normal heart sounds.  Exam reveals no gallop.   No murmur heard. Pulmonary/Chest: Effort normal and breath sounds normal. No stridor. No respiratory distress. He has no decreased breath sounds. He has no wheezes. He has no rhonchi. He has no rales.  Abdominal: Soft. Normal appearance and bowel sounds are normal. He exhibits no distension. There is tenderness in the right lower quadrant. There is no rigidity, no rebound, no guarding and no CVA tenderness.    Musculoskeletal: Normal range of motion. He exhibits no edema and no tenderness.  Neurological: He is alert and oriented to person, place, and time. He has normal strength. No cranial nerve deficit or sensory deficit. GCS eye subscore is 4. GCS verbal subscore is 5. GCS motor subscore is 6.  Skin: Skin is warm and dry. No abrasion and no rash noted.  Psychiatric: He has a normal mood and affect. His speech is normal and behavior is normal.    ED Course  Procedures (including critical care time) Labs Review Labs Reviewed  URINE CULTURE  CBC WITH DIFFERENTIAL    COMPREHENSIVE METABOLIC PANEL  URINALYSIS, ROUTINE W REFLEX MICROSCOPIC    Imaging Review No results found.   EKG Interpretation None      MDM   Final diagnoses:  None     Patient given pain meds and feels better. Abdominal CT without acute findings. Repeat abdominal exam at time of discharge remained stable. No surgical abdomen at this time. Stable for discharge   Leota Jacobsen, MD 11/04/13 1215

## 2013-11-04 NOTE — ED Notes (Signed)
MD at bedside. 

## 2013-11-05 LAB — URINE CULTURE: Colony Count: 15000

## 2013-12-11 ENCOUNTER — Ambulatory Visit (INDEPENDENT_AMBULATORY_CARE_PROVIDER_SITE_OTHER): Payer: PRIVATE HEALTH INSURANCE | Admitting: *Deleted

## 2013-12-11 DIAGNOSIS — H9202 Otalgia, left ear: Secondary | ICD-10-CM

## 2013-12-11 NOTE — Progress Notes (Signed)
    Pt in nurse clinic stating he has left ear pain; felt something was in his ear.  Pt stated the pain started last night.  Precept with Dr. Gwendlyn Deutscher; ear was assessed by Dr. Gwendlyn Deutscher; no foreign body noticed in ear.  Start ear irrigation and reassess.  Ear was irrigated; small amount of wax from ear.  Pt stated he felt better after that.  Derl Barrow, RN

## 2013-12-14 ENCOUNTER — Other Ambulatory Visit: Payer: Self-pay | Admitting: Family Medicine

## 2013-12-14 NOTE — Telephone Encounter (Signed)
Pt called and needs a refill on his pain medication left up front for pick up. He has an appointment on 11/25 but will be out of medication on Wednesday 11/04. Please call him at [phone]. jw

## 2013-12-14 NOTE — Telephone Encounter (Signed)
Needs appointment before pain meds refilled Given 3 months rx last visit: reviewed that appointment needed to be made before then Will need to make SDA or with other provider if needs sooner.  [facility], MD

## 2013-12-15 ENCOUNTER — Other Ambulatory Visit: Payer: Self-pay | Admitting: *Deleted

## 2013-12-15 NOTE — Telephone Encounter (Signed)
Opened in error. Jazmin Hartsell,CMA  

## 2013-12-15 NOTE — Telephone Encounter (Signed)
Pt has an appt with Dr. Deniece Ree on 12/21/13 but advised him to keep his follow up appt with you still. Kiasia Chou,CMA

## 2013-12-15 NOTE — Telephone Encounter (Signed)
Not usual policy to give narcotics more than 3 months worth at a time. Therefore it is important for patient to be seen before more are given. I am sorry for the inconvenience [facility], MD

## 2013-12-15 NOTE — Telephone Encounter (Signed)
Pt has an appt on 01/06/14.  Jazmin Hartsell,CMA

## 2013-12-21 ENCOUNTER — Encounter: Payer: Self-pay | Admitting: Family Medicine

## 2013-12-21 ENCOUNTER — Ambulatory Visit (INDEPENDENT_AMBULATORY_CARE_PROVIDER_SITE_OTHER): Payer: PRIVATE HEALTH INSURANCE | Admitting: Family Medicine

## 2013-12-21 VITALS — BP 155/84 | HR 90 | Ht 70.0 in | Wt 176.0 lb

## 2013-12-21 DIAGNOSIS — M159 Polyosteoarthritis, unspecified: Secondary | ICD-10-CM

## 2013-12-21 DIAGNOSIS — M150 Primary generalized (osteo)arthritis: Secondary | ICD-10-CM

## 2013-12-21 MED ORDER — OMEPRAZOLE 40 MG PO CPDR: 40.0000 mg | DELAYED_RELEASE_CAPSULE | Freq: Every morning | ORAL | Status: DC

## 2013-12-21 NOTE — Progress Notes (Signed)
   Subjective:    Patient ID: Joel Donaldson, male    DOB: 03-26-1940, 73 y.o.   MRN: [id number]  HPI  Here for refill of percocet which he takes for shoulder and knee pain/arthritis.  Pain well controlled.  Review of Systems  Constitutional: Negative for chills and diaphoresis.  Respiratory: Negative for cough and shortness of breath.   Cardiovascular: Negative for leg swelling.  Gastrointestinal: Negative for abdominal pain, diarrhea, constipation and anal bleeding.  Endocrine: Negative for cold intolerance and heat intolerance.  Genitourinary: Negative for dysuria, urgency, decreased urine volume and difficulty urinating.  Musculoskeletal: Positive for back pain, joint swelling, arthralgias, gait problem and neck pain.  Neurological: Negative for headaches.       Objective:   Physical Exam  Constitutional: He appears well-developed and well-nourished. No distress.  HENT:  Head: Normocephalic and atraumatic.  Eyes: Conjunctivae are normal. Pupils are equal, round, and reactive to light.  Neck: Normal range of motion. Neck supple. No thyromegaly present.  Cardiovascular: Normal rate.   Pulmonary/Chest: Effort normal. No respiratory distress.  Skin: Skin is warm and dry. No rash noted. He is not diaphoretic. No erythema.  Psychiatric: He has a normal mood and affect. His behavior is normal.   BP 155/84 mmHg  Pulse 90  Ht 5\' 10"  (1.778 m)  Wt 176 lb (79.833 kg)  BMI 25.25 kg/m2         Assessment & Plan:  RF percocet 10/325mg  BID #60 with two refills F/u Dr Skeet Simmer No changes to current therapy, advised of fall risk with narcotics Stable HTN: continue current medications, encouraged low salt diet  Merla Riches, MD 4:33 PM

## 2013-12-21 NOTE — Addendum Note (Signed)
Addended by: Nila Nephew on: 12/21/2013 04:51 PM   Modules accepted: Orders

## 2014-01-06 ENCOUNTER — Ambulatory Visit: Payer: PRIVATE HEALTH INSURANCE | Admitting: Family Medicine

## 2014-01-25 ENCOUNTER — Encounter: Payer: Self-pay | Admitting: Family Medicine

## 2014-01-25 ENCOUNTER — Ambulatory Visit (INDEPENDENT_AMBULATORY_CARE_PROVIDER_SITE_OTHER): Payer: PRIVATE HEALTH INSURANCE | Admitting: Family Medicine

## 2014-01-25 ENCOUNTER — Telehealth: Payer: Self-pay | Admitting: *Deleted

## 2014-01-25 VITALS — BP 113/68 | HR 106 | Temp 98.6°F | Wt 176.0 lb

## 2014-01-25 DIAGNOSIS — M150 Primary generalized (osteo)arthritis: Secondary | ICD-10-CM

## 2014-01-25 DIAGNOSIS — M159 Polyosteoarthritis, unspecified: Secondary | ICD-10-CM

## 2014-01-25 MED ORDER — OXYCODONE-ACETAMINOPHEN 10-325 MG PO TABS: 1.0000 | ORAL_TABLET | Freq: Two times a day (BID) | ORAL | Status: DC

## 2014-01-25 NOTE — Progress Notes (Signed)
   Subjective:    Patient ID: Joel Donaldson, male    DOB: 1940-08-27, 73 y.o.   MRN: [id number]  HPI  Here for refill of percocet which he takes for shoulder and knee pain/arthritis.  Pain well controlled.  Review of Systems  Constitutional: Negative for chills and diaphoresis.  Respiratory: Negative for cough and shortness of breath.   Cardiovascular: Negative for leg swelling.  Gastrointestinal: Negative for abdominal pain, diarrhea, constipation and anal bleeding.  Endocrine: Negative for cold intolerance and heat intolerance.  Genitourinary: Negative for dysuria, urgency, decreased urine volume and difficulty urinating.  Musculoskeletal: Positive for back pain, joint swelling, arthralgias, gait problem and neck pain.  Neurological: Negative for headaches.       Objective:   Physical Exam  Constitutional: He appears well-developed and well-nourished. No distress.  HENT:  Head: Normocephalic and atraumatic.  Eyes: Conjunctivae are normal. Pupils are equal, round, and reactive to light.  Neck: Normal range of motion. Neck supple. No thyromegaly present.  Cardiovascular: Normal rate.   Pulmonary/Chest: Effort normal. No respiratory distress.  Musculoskeletal:  Bilateral PIP enlarged  Skin: Skin is warm and dry. No rash noted. He is not diaphoretic. No erythema.  Psychiatric: He has a normal mood and affect. His behavior is normal.   BP 113/68 mmHg  Pulse 106  Temp(Src) 98.6 F (37 C) (Oral)  Wt 176 lb (79.833 kg)      Assessment & Plan:  RF percocet 10/325mg  BID #60 three separate rx (for 3 month supply) No changes to current therapy, advised of fall risk with narcotics Stable  F/u Dr Skeet Simmer - at next visit to ensure up to date on all other measures, has had flu shot.  Merla Riches, MD 1:43 PM

## 2014-01-25 NOTE — Telephone Encounter (Signed)
Pt in nurse clinic requesting refill on pain medication.  Pt stated he is out.  Pt was seen by Dr. Deniece Ree 12/2013 and given a Rx for pain medication.  Pt stated she put refills on the medication, but the pharmacist stated that is not allowed and he could not pick up his medication.  Appt scheduled for today with Dr. Deniece Ree at 1:45 PM.  Derl Barrow, RN

## 2014-02-10 ENCOUNTER — Emergency Department (HOSPITAL_COMMUNITY)
Admission: EM | Admit: 2014-02-10 | Discharge: 2014-02-10 | Disposition: A | Discharge status: Home / Self Care | Payer: PRIVATE HEALTH INSURANCE | Attending: Emergency Medicine | Admitting: Emergency Medicine

## 2014-02-10 ENCOUNTER — Encounter (HOSPITAL_COMMUNITY): Payer: Self-pay | Admitting: Cardiology

## 2014-02-10 ENCOUNTER — Emergency Department (HOSPITAL_COMMUNITY): Payer: PRIVATE HEALTH INSURANCE

## 2014-02-10 DIAGNOSIS — K219 Gastro-esophageal reflux disease without esophagitis: Secondary | ICD-10-CM | POA: Insufficient documentation

## 2014-02-10 DIAGNOSIS — R0789 Other chest pain: Secondary | ICD-10-CM | POA: Diagnosis not present

## 2014-02-10 DIAGNOSIS — Z87891 Personal history of nicotine dependence: Secondary | ICD-10-CM | POA: Insufficient documentation

## 2014-02-10 DIAGNOSIS — Z7982 Long term (current) use of aspirin: Secondary | ICD-10-CM | POA: Diagnosis not present

## 2014-02-10 DIAGNOSIS — Z79899 Other long term (current) drug therapy: Secondary | ICD-10-CM | POA: Diagnosis not present

## 2014-02-10 DIAGNOSIS — M1389 Other specified arthritis, multiple sites: Secondary | ICD-10-CM | POA: Insufficient documentation

## 2014-02-10 DIAGNOSIS — R011 Cardiac murmur, unspecified: Secondary | ICD-10-CM | POA: Diagnosis not present

## 2014-02-10 DIAGNOSIS — Z8546 Personal history of malignant neoplasm of prostate: Secondary | ICD-10-CM | POA: Insufficient documentation

## 2014-02-10 DIAGNOSIS — I10 Essential (primary) hypertension: Secondary | ICD-10-CM | POA: Diagnosis not present

## 2014-02-10 DIAGNOSIS — R079 Chest pain, unspecified: Secondary | ICD-10-CM | POA: Diagnosis present

## 2014-02-10 DIAGNOSIS — Z791 Long term (current) use of non-steroidal anti-inflammatories (NSAID): Secondary | ICD-10-CM | POA: Diagnosis not present

## 2014-02-10 DIAGNOSIS — Z8669 Personal history of other diseases of the nervous system and sense organs: Secondary | ICD-10-CM | POA: Insufficient documentation

## 2014-02-10 DIAGNOSIS — M069 Rheumatoid arthritis, unspecified: Secondary | ICD-10-CM | POA: Insufficient documentation

## 2014-02-10 DIAGNOSIS — J45909 Unspecified asthma, uncomplicated: Secondary | ICD-10-CM | POA: Diagnosis not present

## 2014-02-10 DIAGNOSIS — Z8659 Personal history of other mental and behavioral disorders: Secondary | ICD-10-CM | POA: Diagnosis not present

## 2014-02-10 LAB — BASIC METABOLIC PANEL
Anion gap: 6 (ref 5–15)
BUN: 6 mg/dL (ref 6–23)
CO2: 27 mmol/L (ref 19–32)
Calcium: 8.5 mg/dL (ref 8.4–10.5)
Chloride: 105 mEq/L (ref 96–112)
Creatinine, Ser: 0.87 mg/dL (ref 0.50–1.35)
GFR calc Af Amer: >90 mL/min (ref >90)
GFR calc non Af Amer: 83 mL/min — ABNORMAL LOW (ref >90)
Glucose, Bld: 106 mg/dL — ABNORMAL HIGH (ref 70–99)
Potassium: 3.2 mmol/L — ABNORMAL LOW (ref 3.5–5.1)
Sodium: 138 mmol/L (ref 135–145)

## 2014-02-10 LAB — I-STAT TROPONIN, ED
Troponin i, poc: 0.01 ng/mL (ref 0.00–0.08)
Troponin i, poc: 0.01 ng/mL (ref 0.00–0.08)

## 2014-02-10 LAB — CBC
HCT: 38.4 % — ABNORMAL LOW (ref 39.0–52.0)
Hemoglobin: 12.8 g/dL — ABNORMAL LOW (ref 13.0–17.0)
MCH: 28.3 pg (ref 26.0–34.0)
MCHC: 33.3 g/dL (ref 30.0–36.0)
MCV: 84.8 fL (ref 78.0–100.0)
Platelets: 148 10*3/uL — ABNORMAL LOW (ref 150–400)
RBC: 4.53 MIL/uL (ref 4.22–5.81)
RDW: 16.4 % — ABNORMAL HIGH (ref 11.5–15.5)
WBC: 3.1 10*3/uL — ABNORMAL LOW (ref 4.0–10.5)

## 2014-02-10 NOTE — ED Notes (Signed)
Pt to department via EMS- reports that he started having left sided chest pain this afternoon around 1230. Pt took 324 ASA and 2 nitros to decrease the pain from 7/10 to 0/10. 20g LAC Bp-150/102 Hr-99 O2-95%

## 2014-02-10 NOTE — Discharge Instructions (Signed)

## 2014-02-10 NOTE — ED Provider Notes (Signed)
CSN: [id number]     Arrival date & time 02/10/14  1511 History   First MD Initiated Contact with Patient 02/10/14 1606     Chief Complaint  Patient presents with  . Chest Pain     (Consider location/radiation/quality/duration/timing/severity/associated sxs/prior Treatment) Patient is a 73 y.o. male presenting with chest pain. The history is provided by the patient.  Chest Pain Pain location:  L chest Pain quality: sharp   Pain radiates to:  Does not radiate Pain radiates to the back: no   Pain severity:  Moderate (7/10 at maximum) Onset quality:  Gradual Duration:  2 hours Timing:  Constant Progression:  Resolved Chronicity:  Recurrent Context: at rest   Relieved by:  Nitroglycerin Worsened by:  Nothing tried Associated symptoms: no abdominal pain, no back pain, no cough, no diaphoresis, no dizziness, no dysphagia, no fever, no headache, no lower extremity edema, no nausea, no near-syncope, no palpitations, no shortness of breath, not vomiting and no weakness   Risk factors: no prior DVT/PE     Past Medical History  Diagnosis Date  . Arthritis     hands, back, knees  . Anxiety   . Depression   . GERD (gastroesophageal reflux disease)   . Glaucoma   . Hypertension   . Prostate cancer   . Heart murmur asymptomatic  . Short of breath on exertion   . Asthma   . Nocturia   . Hx of radioisotope therapy 07/26/11    Radiosactive Prostate Seed Implant/ I-125 Seeds  . Chest pain at rest 12/19/2011  . Rheumatoid arthritis 10/10/2012   Past Surgical History  Procedure Laterality Date  . Shoulder surgery  YRS AGO    LEFT  . Lumbar fusion  1995  . Radioactive seed implant  09/14/2011    Procedure: RADIOACTIVE SEED IMPLANT;  Surgeon: Hanley Ben, MD;  Location: [facility];  Service: Urology;  Laterality: N/A;  Total number of seeds -    . Cystoscopy  09/14/2011    Procedure: CYSTOSCOPY FLEXIBLE;  Surgeon: Hanley Ben, MD;  Location: [facility];  Service: Urology;  Laterality: N/A;  No seeds seen in the bladder   Family History  Problem Relation Age of Onset  . Diabetes Mother    History  Substance Use Topics  . Smoking status: Former Smoker -- 2.00 packs/day for 50 years    Types: Cigarettes    Quit date: 10/13/2004  . Smokeless tobacco: Never Used     Comment: Still not smoking.   . Alcohol Use: 8.4 oz/week    14 Shots of liquor per week     Comment: occassional    Review of Systems  Constitutional: Negative for fever, diaphoresis, activity change and appetite change.  HENT: Negative for facial swelling, sore throat, tinnitus, trouble swallowing and voice change.   Eyes: Negative for pain, redness and visual disturbance.  Respiratory: Negative for cough, chest tightness, shortness of breath and wheezing.   Cardiovascular: Positive for chest pain. Negative for palpitations, leg swelling and near-syncope.  Gastrointestinal: Negative for nausea, vomiting, abdominal pain, diarrhea, constipation and abdominal distention.  Endocrine: Negative.   Genitourinary: Negative.  Negative for dysuria, decreased urine volume, scrotal swelling and testicular pain.  Musculoskeletal: Negative for myalgias, back pain and gait problem.  Skin: Negative.  Negative for rash.  Neurological: Negative.  Negative for dizziness, tremors, weakness and headaches.  Psychiatric/Behavioral: Negative for suicidal ideas, hallucinations and self-injury. The patient is not nervous/anxious.  Allergies  Pollen extract and Tramadol  Home Medications   Prior to Admission medications   Medication Sig Start Date End Date Taking? Authorizing Provider  albuterol (PROVENTIL HFA;VENTOLIN HFA) 108 (90 BASE) MCG/ACT inhaler Inhale 2 puffs into the lungs every 6 (six) hours as needed for wheezing or shortness of breath (wheezing).    Historical Provider, MD  ALREX 0.2 % SUSP Place 2 drops into both eyes 2 (two) times daily.  02/22/12   Historical  Provider, MD  aspirin 81 MG EC tablet Take 81 mg by mouth daily.     Historical Provider, MD  ergocalciferol (VITAMIN D2) 50000 UNITS capsule Take 1 capsule (50,000 Units total) by mouth once a week. 07/07/13   Waldemar Dickens, MD  lisinopril-hydrochlorothiazide (PRINZIDE,ZESTORETIC) 20-12.5 MG per tablet Take 1 tablet by mouth daily. 04/08/13   Waldemar Dickens, MD  naproxen sodium (ANAPROX) 220 MG tablet Take 220 mg by mouth 2 (two) times daily with a meal.    Historical Provider, MD  nitroGLYCERIN (NITROSTAT) 0.4 MG SL tablet Place 0.4 mg under the tongue every 5 (five) minutes as needed for chest pain (chest pain).  12/20/11   Holly [place], MD  omeprazole (PRILOSEC) 40 MG capsule Take 1 capsule (40 mg total) by mouth every morning. 12/21/13   Merla Riches, MD  oxyCODONE-acetaminophen (PERCOCET) 10-325 MG per tablet Take 1 tablet by mouth 2 (two) times daily. 01/25/14   Merla Riches, MD   BP 180/101 mmHg  Pulse 67  Resp 18  SpO2 99% Physical Exam  Constitutional: He is oriented to person, place, and time. He appears well-developed and well-nourished. No distress.  HENT:  Head: Normocephalic and atraumatic.  Right Ear: External ear normal.  Left Ear: External ear normal.  Nose: Nose normal.  Mouth/Throat: Oropharynx is clear and moist.  Eyes: Conjunctivae and EOM are normal. Pupils are equal, round, and reactive to light. No scleral icterus.  Neck: Normal range of motion. Neck supple. No JVD present. No tracheal deviation present. No thyromegaly present.  Cardiovascular: Normal rate and intact distal pulses.  Exam reveals no gallop and no friction rub.   No murmur heard. Pulmonary/Chest: Effort normal and breath sounds normal. No stridor. No respiratory distress. He has no wheezes. He has no rales. He exhibits no tenderness.  Abdominal: Soft. He exhibits no distension. There is no tenderness. There is no rebound and no guarding.  Musculoskeletal: Normal range of motion.  He exhibits no edema or tenderness.  Neurological: He is alert and oriented to person, place, and time. No cranial nerve deficit. He exhibits normal muscle tone. Coordination normal.  5/5 strength in all 4 extremities. Normal Gait.   Skin: Skin is warm and dry. No rash noted. He is not diaphoretic.  Psychiatric: He has a normal mood and affect. His behavior is normal.  Nursing note and vitals reviewed.   ED Course  Procedures (including critical care time) Labs Review Labs Reviewed  CBC - Abnormal; Notable for the following:    WBC 3.1 (*)    Hemoglobin 12.8 (*)    HCT 38.4 (*)    RDW 16.4 (*)    Platelets 148 (*)    All other components within normal limits  BASIC METABOLIC PANEL - Abnormal; Notable for the following:    Potassium 3.2 (*)    Glucose, Bld 106 (*)    GFR calc non Af Amer 83 (*)    All other components within normal limits  Randolm Idol, ED  Randolm Idol, ED    Imaging Review Dg Chest Port 1 View  02/10/2014   CLINICAL DATA:  Left lower chest pain.  EXAM: PORTABLE CHEST - 1 VIEW  COMPARISON:  Chest radiograph 05/27/2012, CT abdomen 11/04/2013  FINDINGS: Two portable AP views submitted. Eventration of right hemidiaphragm. Heart is at the upper limits of normal in size. No confluent airspace disease. Suspect minimal atelectasis at the lung bases. There is no pleural effusion or pneumothorax. There is an old lower left rib fracture.  IMPRESSION: Eventration of right hemidiaphragm.  Mild bibasilar atelectasis.   Electronically Signed   By: Jeb Levering M.D.   On: 02/10/2014 16:52     EKG Interpretation   Date/Time:  Wednesday February 10 2014 15:11:20 EST Ventricular Rate:  67 PR Interval:  156 QRS Duration: 78 QT Interval:  434 QTC Calculation: 458 R Axis:   11 Text Interpretation:  Normal sinus rhythm since last tracing no  significant change Confirmed by Wilson Singer  MD, STEPHEN ([id number]) on 02/10/2014  3:17:43 PM      MDM   Final diagnoses:   Chest pain, unspecified chest pain type    Pt is a 73 y.o. M with no known CAD who presents for atypical CP that started at 12:30 at rest and resolved with NG. HEART score 3. Delta troponin negative.   I estimate there is LOW risk for PERICARDIAL TAMPONADE, PNEUMOTHORAX, PULMONARY EMBOLISM, ACUTE CORONARY SYNDROME, OR THORACIC AORTIC DISSECTION, thus I consider the discharge disposition reasonable. We have discussed the diagnosis and risks, and we agree with discharging home to follow-up with their primary doctor. We also discussed returning to the Emergency Department immediately if new or worsening symptoms occur. We have discussed the symptoms which are most concerning (e.g., bloody sputum, fever, worsening pain or shortness of breath, vomiting) that necessitate immediate return.   Patient seen with attending, Dr. Wilson Singer, who oversaw clinical decision making.     Margaretann Loveless, MD 02/10/14 0932  Virgel Manifold, MD 02/11/14 [phone]

## 2014-02-13 DIAGNOSIS — I10 Essential (primary) hypertension: Secondary | ICD-10-CM | POA: Diagnosis not present

## 2014-02-13 DIAGNOSIS — C61 Malignant neoplasm of prostate: Secondary | ICD-10-CM | POA: Diagnosis not present

## 2014-02-13 DIAGNOSIS — B760 Ancylostomiasis: Secondary | ICD-10-CM | POA: Diagnosis not present

## 2014-02-14 DIAGNOSIS — I10 Essential (primary) hypertension: Secondary | ICD-10-CM | POA: Diagnosis not present

## 2014-02-14 DIAGNOSIS — B760 Ancylostomiasis: Secondary | ICD-10-CM | POA: Diagnosis not present

## 2014-02-14 DIAGNOSIS — C61 Malignant neoplasm of prostate: Secondary | ICD-10-CM | POA: Diagnosis not present

## 2014-02-15 DIAGNOSIS — I10 Essential (primary) hypertension: Secondary | ICD-10-CM | POA: Diagnosis not present

## 2014-02-15 DIAGNOSIS — B760 Ancylostomiasis: Secondary | ICD-10-CM | POA: Diagnosis not present

## 2014-02-15 DIAGNOSIS — C61 Malignant neoplasm of prostate: Secondary | ICD-10-CM | POA: Diagnosis not present

## 2014-02-16 DIAGNOSIS — C61 Malignant neoplasm of prostate: Secondary | ICD-10-CM | POA: Diagnosis not present

## 2014-02-16 DIAGNOSIS — B760 Ancylostomiasis: Secondary | ICD-10-CM | POA: Diagnosis not present

## 2014-02-16 DIAGNOSIS — I10 Essential (primary) hypertension: Secondary | ICD-10-CM | POA: Diagnosis not present

## 2014-02-17 DIAGNOSIS — I10 Essential (primary) hypertension: Secondary | ICD-10-CM | POA: Diagnosis not present

## 2014-02-17 DIAGNOSIS — B760 Ancylostomiasis: Secondary | ICD-10-CM | POA: Diagnosis not present

## 2014-02-17 DIAGNOSIS — C61 Malignant neoplasm of prostate: Secondary | ICD-10-CM | POA: Diagnosis not present

## 2014-02-18 DIAGNOSIS — C61 Malignant neoplasm of prostate: Secondary | ICD-10-CM | POA: Diagnosis not present

## 2014-02-18 DIAGNOSIS — I10 Essential (primary) hypertension: Secondary | ICD-10-CM | POA: Diagnosis not present

## 2014-02-18 DIAGNOSIS — B760 Ancylostomiasis: Secondary | ICD-10-CM | POA: Diagnosis not present

## 2014-02-19 DIAGNOSIS — C61 Malignant neoplasm of prostate: Secondary | ICD-10-CM | POA: Diagnosis not present

## 2014-02-19 DIAGNOSIS — I10 Essential (primary) hypertension: Secondary | ICD-10-CM | POA: Diagnosis not present

## 2014-02-19 DIAGNOSIS — B760 Ancylostomiasis: Secondary | ICD-10-CM | POA: Diagnosis not present

## 2014-02-20 DIAGNOSIS — I10 Essential (primary) hypertension: Secondary | ICD-10-CM | POA: Diagnosis not present

## 2014-02-20 DIAGNOSIS — B760 Ancylostomiasis: Secondary | ICD-10-CM | POA: Diagnosis not present

## 2014-02-20 DIAGNOSIS — C61 Malignant neoplasm of prostate: Secondary | ICD-10-CM | POA: Diagnosis not present

## 2014-02-21 DIAGNOSIS — B760 Ancylostomiasis: Secondary | ICD-10-CM | POA: Diagnosis not present

## 2014-02-21 DIAGNOSIS — C61 Malignant neoplasm of prostate: Secondary | ICD-10-CM | POA: Diagnosis not present

## 2014-02-21 DIAGNOSIS — I10 Essential (primary) hypertension: Secondary | ICD-10-CM | POA: Diagnosis not present

## 2014-02-22 DIAGNOSIS — C61 Malignant neoplasm of prostate: Secondary | ICD-10-CM | POA: Diagnosis not present

## 2014-02-22 DIAGNOSIS — I10 Essential (primary) hypertension: Secondary | ICD-10-CM | POA: Diagnosis not present

## 2014-02-22 DIAGNOSIS — B760 Ancylostomiasis: Secondary | ICD-10-CM | POA: Diagnosis not present

## 2014-02-23 DIAGNOSIS — I10 Essential (primary) hypertension: Secondary | ICD-10-CM | POA: Diagnosis not present

## 2014-02-23 DIAGNOSIS — B760 Ancylostomiasis: Secondary | ICD-10-CM | POA: Diagnosis not present

## 2014-02-23 DIAGNOSIS — C61 Malignant neoplasm of prostate: Secondary | ICD-10-CM | POA: Diagnosis not present

## 2014-02-24 DIAGNOSIS — I10 Essential (primary) hypertension: Secondary | ICD-10-CM | POA: Diagnosis not present

## 2014-02-24 DIAGNOSIS — C61 Malignant neoplasm of prostate: Secondary | ICD-10-CM | POA: Diagnosis not present

## 2014-02-24 DIAGNOSIS — B760 Ancylostomiasis: Secondary | ICD-10-CM | POA: Diagnosis not present

## 2014-02-25 DIAGNOSIS — I10 Essential (primary) hypertension: Secondary | ICD-10-CM | POA: Diagnosis not present

## 2014-02-25 DIAGNOSIS — B760 Ancylostomiasis: Secondary | ICD-10-CM | POA: Diagnosis not present

## 2014-02-25 DIAGNOSIS — C61 Malignant neoplasm of prostate: Secondary | ICD-10-CM | POA: Diagnosis not present

## 2014-02-26 DIAGNOSIS — B760 Ancylostomiasis: Secondary | ICD-10-CM | POA: Diagnosis not present

## 2014-02-26 DIAGNOSIS — I10 Essential (primary) hypertension: Secondary | ICD-10-CM | POA: Diagnosis not present

## 2014-02-26 DIAGNOSIS — C61 Malignant neoplasm of prostate: Secondary | ICD-10-CM | POA: Diagnosis not present

## 2014-02-27 DIAGNOSIS — B760 Ancylostomiasis: Secondary | ICD-10-CM | POA: Diagnosis not present

## 2014-02-27 DIAGNOSIS — I10 Essential (primary) hypertension: Secondary | ICD-10-CM | POA: Diagnosis not present

## 2014-02-27 DIAGNOSIS — C61 Malignant neoplasm of prostate: Secondary | ICD-10-CM | POA: Diagnosis not present

## 2014-02-28 DIAGNOSIS — I10 Essential (primary) hypertension: Secondary | ICD-10-CM | POA: Diagnosis not present

## 2014-02-28 DIAGNOSIS — C61 Malignant neoplasm of prostate: Secondary | ICD-10-CM | POA: Diagnosis not present

## 2014-02-28 DIAGNOSIS — B760 Ancylostomiasis: Secondary | ICD-10-CM | POA: Diagnosis not present

## 2014-03-01 DIAGNOSIS — C61 Malignant neoplasm of prostate: Secondary | ICD-10-CM | POA: Diagnosis not present

## 2014-03-01 DIAGNOSIS — B760 Ancylostomiasis: Secondary | ICD-10-CM | POA: Diagnosis not present

## 2014-03-01 DIAGNOSIS — I10 Essential (primary) hypertension: Secondary | ICD-10-CM | POA: Diagnosis not present

## 2014-03-02 DIAGNOSIS — C61 Malignant neoplasm of prostate: Secondary | ICD-10-CM | POA: Diagnosis not present

## 2014-03-02 DIAGNOSIS — B760 Ancylostomiasis: Secondary | ICD-10-CM | POA: Diagnosis not present

## 2014-03-02 DIAGNOSIS — I10 Essential (primary) hypertension: Secondary | ICD-10-CM | POA: Diagnosis not present

## 2014-03-03 DIAGNOSIS — I10 Essential (primary) hypertension: Secondary | ICD-10-CM | POA: Diagnosis not present

## 2014-03-03 DIAGNOSIS — B760 Ancylostomiasis: Secondary | ICD-10-CM | POA: Diagnosis not present

## 2014-03-03 DIAGNOSIS — C61 Malignant neoplasm of prostate: Secondary | ICD-10-CM | POA: Diagnosis not present

## 2014-03-10 DIAGNOSIS — B760 Ancylostomiasis: Secondary | ICD-10-CM | POA: Diagnosis not present

## 2014-03-10 DIAGNOSIS — I10 Essential (primary) hypertension: Secondary | ICD-10-CM | POA: Diagnosis not present

## 2014-03-10 DIAGNOSIS — C61 Malignant neoplasm of prostate: Secondary | ICD-10-CM | POA: Diagnosis not present

## 2014-03-11 DIAGNOSIS — B760 Ancylostomiasis: Secondary | ICD-10-CM | POA: Diagnosis not present

## 2014-03-11 DIAGNOSIS — I10 Essential (primary) hypertension: Secondary | ICD-10-CM | POA: Diagnosis not present

## 2014-03-11 DIAGNOSIS — C61 Malignant neoplasm of prostate: Secondary | ICD-10-CM | POA: Diagnosis not present

## 2014-03-12 DIAGNOSIS — I10 Essential (primary) hypertension: Secondary | ICD-10-CM | POA: Diagnosis not present

## 2014-03-12 DIAGNOSIS — B760 Ancylostomiasis: Secondary | ICD-10-CM | POA: Diagnosis not present

## 2014-03-12 DIAGNOSIS — C61 Malignant neoplasm of prostate: Secondary | ICD-10-CM | POA: Diagnosis not present

## 2014-03-13 DIAGNOSIS — C61 Malignant neoplasm of prostate: Secondary | ICD-10-CM | POA: Diagnosis not present

## 2014-03-13 DIAGNOSIS — B760 Ancylostomiasis: Secondary | ICD-10-CM | POA: Diagnosis not present

## 2014-03-13 DIAGNOSIS — I10 Essential (primary) hypertension: Secondary | ICD-10-CM | POA: Diagnosis not present

## 2014-03-14 DIAGNOSIS — C61 Malignant neoplasm of prostate: Secondary | ICD-10-CM | POA: Diagnosis not present

## 2014-03-14 DIAGNOSIS — I10 Essential (primary) hypertension: Secondary | ICD-10-CM | POA: Diagnosis not present

## 2014-03-14 DIAGNOSIS — B760 Ancylostomiasis: Secondary | ICD-10-CM | POA: Diagnosis not present

## 2014-03-15 DIAGNOSIS — I10 Essential (primary) hypertension: Secondary | ICD-10-CM | POA: Diagnosis not present

## 2014-03-15 DIAGNOSIS — B760 Ancylostomiasis: Secondary | ICD-10-CM | POA: Diagnosis not present

## 2014-03-15 DIAGNOSIS — C61 Malignant neoplasm of prostate: Secondary | ICD-10-CM | POA: Diagnosis not present

## 2014-03-16 DIAGNOSIS — C61 Malignant neoplasm of prostate: Secondary | ICD-10-CM | POA: Diagnosis not present

## 2014-03-16 DIAGNOSIS — B760 Ancylostomiasis: Secondary | ICD-10-CM | POA: Diagnosis not present

## 2014-03-16 DIAGNOSIS — I10 Essential (primary) hypertension: Secondary | ICD-10-CM | POA: Diagnosis not present

## 2014-03-17 DIAGNOSIS — C61 Malignant neoplasm of prostate: Secondary | ICD-10-CM | POA: Diagnosis not present

## 2014-03-17 DIAGNOSIS — I10 Essential (primary) hypertension: Secondary | ICD-10-CM | POA: Diagnosis not present

## 2014-03-17 DIAGNOSIS — B760 Ancylostomiasis: Secondary | ICD-10-CM | POA: Diagnosis not present

## 2014-03-18 DIAGNOSIS — C61 Malignant neoplasm of prostate: Secondary | ICD-10-CM | POA: Diagnosis not present

## 2014-03-18 DIAGNOSIS — B760 Ancylostomiasis: Secondary | ICD-10-CM | POA: Diagnosis not present

## 2014-03-18 DIAGNOSIS — I10 Essential (primary) hypertension: Secondary | ICD-10-CM | POA: Diagnosis not present

## 2014-03-19 DIAGNOSIS — B760 Ancylostomiasis: Secondary | ICD-10-CM | POA: Diagnosis not present

## 2014-03-19 DIAGNOSIS — I10 Essential (primary) hypertension: Secondary | ICD-10-CM | POA: Diagnosis not present

## 2014-03-19 DIAGNOSIS — C61 Malignant neoplasm of prostate: Secondary | ICD-10-CM | POA: Diagnosis not present

## 2014-03-20 DIAGNOSIS — C61 Malignant neoplasm of prostate: Secondary | ICD-10-CM | POA: Diagnosis not present

## 2014-03-20 DIAGNOSIS — I10 Essential (primary) hypertension: Secondary | ICD-10-CM | POA: Diagnosis not present

## 2014-03-20 DIAGNOSIS — B760 Ancylostomiasis: Secondary | ICD-10-CM | POA: Diagnosis not present

## 2014-03-21 DIAGNOSIS — C61 Malignant neoplasm of prostate: Secondary | ICD-10-CM | POA: Diagnosis not present

## 2014-03-21 DIAGNOSIS — B760 Ancylostomiasis: Secondary | ICD-10-CM | POA: Diagnosis not present

## 2014-03-21 DIAGNOSIS — I10 Essential (primary) hypertension: Secondary | ICD-10-CM | POA: Diagnosis not present

## 2014-03-22 DIAGNOSIS — I10 Essential (primary) hypertension: Secondary | ICD-10-CM | POA: Diagnosis not present

## 2014-03-22 DIAGNOSIS — C61 Malignant neoplasm of prostate: Secondary | ICD-10-CM | POA: Diagnosis not present

## 2014-03-22 DIAGNOSIS — B760 Ancylostomiasis: Secondary | ICD-10-CM | POA: Diagnosis not present

## 2014-03-23 DIAGNOSIS — B760 Ancylostomiasis: Secondary | ICD-10-CM | POA: Diagnosis not present

## 2014-03-23 DIAGNOSIS — C61 Malignant neoplasm of prostate: Secondary | ICD-10-CM | POA: Diagnosis not present

## 2014-03-23 DIAGNOSIS — I10 Essential (primary) hypertension: Secondary | ICD-10-CM | POA: Diagnosis not present

## 2014-03-24 DIAGNOSIS — I10 Essential (primary) hypertension: Secondary | ICD-10-CM | POA: Diagnosis not present

## 2014-03-24 DIAGNOSIS — C61 Malignant neoplasm of prostate: Secondary | ICD-10-CM | POA: Diagnosis not present

## 2014-03-24 DIAGNOSIS — B760 Ancylostomiasis: Secondary | ICD-10-CM | POA: Diagnosis not present

## 2014-03-25 DIAGNOSIS — C61 Malignant neoplasm of prostate: Secondary | ICD-10-CM | POA: Diagnosis not present

## 2014-03-25 DIAGNOSIS — B760 Ancylostomiasis: Secondary | ICD-10-CM | POA: Diagnosis not present

## 2014-03-25 DIAGNOSIS — I10 Essential (primary) hypertension: Secondary | ICD-10-CM | POA: Diagnosis not present

## 2014-03-26 DIAGNOSIS — C61 Malignant neoplasm of prostate: Secondary | ICD-10-CM | POA: Diagnosis not present

## 2014-03-26 DIAGNOSIS — B760 Ancylostomiasis: Secondary | ICD-10-CM | POA: Diagnosis not present

## 2014-03-26 DIAGNOSIS — I10 Essential (primary) hypertension: Secondary | ICD-10-CM | POA: Diagnosis not present

## 2014-03-27 DIAGNOSIS — C61 Malignant neoplasm of prostate: Secondary | ICD-10-CM | POA: Diagnosis not present

## 2014-03-27 DIAGNOSIS — B760 Ancylostomiasis: Secondary | ICD-10-CM | POA: Diagnosis not present

## 2014-03-27 DIAGNOSIS — I10 Essential (primary) hypertension: Secondary | ICD-10-CM | POA: Diagnosis not present

## 2014-03-28 DIAGNOSIS — B760 Ancylostomiasis: Secondary | ICD-10-CM | POA: Diagnosis not present

## 2014-03-28 DIAGNOSIS — I10 Essential (primary) hypertension: Secondary | ICD-10-CM | POA: Diagnosis not present

## 2014-03-28 DIAGNOSIS — C61 Malignant neoplasm of prostate: Secondary | ICD-10-CM | POA: Diagnosis not present

## 2014-03-30 DIAGNOSIS — I10 Essential (primary) hypertension: Secondary | ICD-10-CM | POA: Diagnosis not present

## 2014-03-30 DIAGNOSIS — C61 Malignant neoplasm of prostate: Secondary | ICD-10-CM | POA: Diagnosis not present

## 2014-03-30 DIAGNOSIS — B760 Ancylostomiasis: Secondary | ICD-10-CM | POA: Diagnosis not present

## 2014-03-31 DIAGNOSIS — C61 Malignant neoplasm of prostate: Secondary | ICD-10-CM | POA: Diagnosis not present

## 2014-03-31 DIAGNOSIS — I10 Essential (primary) hypertension: Secondary | ICD-10-CM | POA: Diagnosis not present

## 2014-03-31 DIAGNOSIS — B760 Ancylostomiasis: Secondary | ICD-10-CM | POA: Diagnosis not present

## 2014-04-01 DIAGNOSIS — I10 Essential (primary) hypertension: Secondary | ICD-10-CM | POA: Diagnosis not present

## 2014-04-01 DIAGNOSIS — B760 Ancylostomiasis: Secondary | ICD-10-CM | POA: Diagnosis not present

## 2014-04-01 DIAGNOSIS — C61 Malignant neoplasm of prostate: Secondary | ICD-10-CM | POA: Diagnosis not present

## 2014-04-02 DIAGNOSIS — B760 Ancylostomiasis: Secondary | ICD-10-CM | POA: Diagnosis not present

## 2014-04-02 DIAGNOSIS — I10 Essential (primary) hypertension: Secondary | ICD-10-CM | POA: Diagnosis not present

## 2014-04-02 DIAGNOSIS — C61 Malignant neoplasm of prostate: Secondary | ICD-10-CM | POA: Diagnosis not present

## 2014-04-03 DIAGNOSIS — C61 Malignant neoplasm of prostate: Secondary | ICD-10-CM | POA: Diagnosis not present

## 2014-04-03 DIAGNOSIS — B760 Ancylostomiasis: Secondary | ICD-10-CM | POA: Diagnosis not present

## 2014-04-03 DIAGNOSIS — I10 Essential (primary) hypertension: Secondary | ICD-10-CM | POA: Diagnosis not present

## 2014-04-04 DIAGNOSIS — I10 Essential (primary) hypertension: Secondary | ICD-10-CM | POA: Diagnosis not present

## 2014-04-04 DIAGNOSIS — B760 Ancylostomiasis: Secondary | ICD-10-CM | POA: Diagnosis not present

## 2014-04-04 DIAGNOSIS — C61 Malignant neoplasm of prostate: Secondary | ICD-10-CM | POA: Diagnosis not present

## 2014-04-05 DIAGNOSIS — B760 Ancylostomiasis: Secondary | ICD-10-CM | POA: Diagnosis not present

## 2014-04-05 DIAGNOSIS — I10 Essential (primary) hypertension: Secondary | ICD-10-CM | POA: Diagnosis not present

## 2014-04-05 DIAGNOSIS — C61 Malignant neoplasm of prostate: Secondary | ICD-10-CM | POA: Diagnosis not present

## 2014-04-06 DIAGNOSIS — C61 Malignant neoplasm of prostate: Secondary | ICD-10-CM | POA: Diagnosis not present

## 2014-04-06 DIAGNOSIS — B760 Ancylostomiasis: Secondary | ICD-10-CM | POA: Diagnosis not present

## 2014-04-06 DIAGNOSIS — I10 Essential (primary) hypertension: Secondary | ICD-10-CM | POA: Diagnosis not present

## 2014-04-07 DIAGNOSIS — B760 Ancylostomiasis: Secondary | ICD-10-CM | POA: Diagnosis not present

## 2014-04-07 DIAGNOSIS — C61 Malignant neoplasm of prostate: Secondary | ICD-10-CM | POA: Diagnosis not present

## 2014-04-07 DIAGNOSIS — I10 Essential (primary) hypertension: Secondary | ICD-10-CM | POA: Diagnosis not present

## 2014-04-08 DIAGNOSIS — B760 Ancylostomiasis: Secondary | ICD-10-CM | POA: Diagnosis not present

## 2014-04-08 DIAGNOSIS — C61 Malignant neoplasm of prostate: Secondary | ICD-10-CM | POA: Diagnosis not present

## 2014-04-08 DIAGNOSIS — I10 Essential (primary) hypertension: Secondary | ICD-10-CM | POA: Diagnosis not present

## 2014-04-09 DIAGNOSIS — I10 Essential (primary) hypertension: Secondary | ICD-10-CM | POA: Diagnosis not present

## 2014-04-09 DIAGNOSIS — C61 Malignant neoplasm of prostate: Secondary | ICD-10-CM | POA: Diagnosis not present

## 2014-04-09 DIAGNOSIS — B760 Ancylostomiasis: Secondary | ICD-10-CM | POA: Diagnosis not present

## 2014-04-10 DIAGNOSIS — C61 Malignant neoplasm of prostate: Secondary | ICD-10-CM | POA: Diagnosis not present

## 2014-04-10 DIAGNOSIS — B760 Ancylostomiasis: Secondary | ICD-10-CM | POA: Diagnosis not present

## 2014-04-10 DIAGNOSIS — I10 Essential (primary) hypertension: Secondary | ICD-10-CM | POA: Diagnosis not present

## 2014-04-11 DIAGNOSIS — I10 Essential (primary) hypertension: Secondary | ICD-10-CM | POA: Diagnosis not present

## 2014-04-11 DIAGNOSIS — B760 Ancylostomiasis: Secondary | ICD-10-CM | POA: Diagnosis not present

## 2014-04-11 DIAGNOSIS — C61 Malignant neoplasm of prostate: Secondary | ICD-10-CM | POA: Diagnosis not present

## 2014-04-12 DIAGNOSIS — I10 Essential (primary) hypertension: Secondary | ICD-10-CM | POA: Diagnosis not present

## 2014-04-12 DIAGNOSIS — B760 Ancylostomiasis: Secondary | ICD-10-CM | POA: Diagnosis not present

## 2014-04-12 DIAGNOSIS — C61 Malignant neoplasm of prostate: Secondary | ICD-10-CM | POA: Diagnosis not present

## 2014-04-13 DIAGNOSIS — I10 Essential (primary) hypertension: Secondary | ICD-10-CM | POA: Diagnosis not present

## 2014-04-13 DIAGNOSIS — B760 Ancylostomiasis: Secondary | ICD-10-CM | POA: Diagnosis not present

## 2014-04-13 DIAGNOSIS — C61 Malignant neoplasm of prostate: Secondary | ICD-10-CM | POA: Diagnosis not present

## 2014-04-14 DIAGNOSIS — B760 Ancylostomiasis: Secondary | ICD-10-CM | POA: Diagnosis not present

## 2014-04-14 DIAGNOSIS — I10 Essential (primary) hypertension: Secondary | ICD-10-CM | POA: Diagnosis not present

## 2014-04-14 DIAGNOSIS — C61 Malignant neoplasm of prostate: Secondary | ICD-10-CM | POA: Diagnosis not present

## 2014-04-15 ENCOUNTER — Encounter: Payer: Self-pay | Admitting: Student

## 2014-04-15 ENCOUNTER — Ambulatory Visit (INDEPENDENT_AMBULATORY_CARE_PROVIDER_SITE_OTHER): Payer: Medicare Other | Admitting: Student

## 2014-04-15 VITALS — BP 130/71 | HR 100 | Temp 97.9°F | Ht 70.0 in | Wt 192.6 lb

## 2014-04-15 DIAGNOSIS — I10 Essential (primary) hypertension: Secondary | ICD-10-CM | POA: Diagnosis not present

## 2014-04-15 DIAGNOSIS — M19019 Primary osteoarthritis, unspecified shoulder: Secondary | ICD-10-CM | POA: Diagnosis not present

## 2014-04-15 DIAGNOSIS — Z7289 Other problems related to lifestyle: Secondary | ICD-10-CM

## 2014-04-15 DIAGNOSIS — F1099 Alcohol use, unspecified with unspecified alcohol-induced disorder: Secondary | ICD-10-CM

## 2014-04-15 DIAGNOSIS — B760 Ancylostomiasis: Secondary | ICD-10-CM | POA: Diagnosis not present

## 2014-04-15 DIAGNOSIS — Z789 Other specified health status: Secondary | ICD-10-CM

## 2014-04-15 DIAGNOSIS — M654 Radial styloid tenosynovitis [de Quervain]: Secondary | ICD-10-CM

## 2014-04-15 DIAGNOSIS — C61 Malignant neoplasm of prostate: Secondary | ICD-10-CM | POA: Diagnosis not present

## 2014-04-15 MED ORDER — IBUPROFEN 200 MG PO TABS: 600.0000 mg | ORAL_TABLET | Freq: Four times a day (QID) | ORAL | Status: AC

## 2014-04-15 MED ORDER — OXYCODONE-ACETAMINOPHEN 10-325 MG PO TABS: 1.0000 | ORAL_TABLET | Freq: Two times a day (BID) | ORAL | Status: DC

## 2014-04-15 NOTE — Patient Instructions (Signed)
Follow up in 1 month Please use your Spica splint and wear as much as possible until follow up Please take Mortin every 6 hours for ten days Your percocet was refilled. Please call with questions or concerns A colonoscopy was also ordered for you Please call and schedule for a colonoscopy.

## 2014-04-15 NOTE — Assessment & Plan Note (Signed)
Stopped drinking alcohol approximately 5 months ago  - congratulaed him on his effort

## 2014-04-15 NOTE — Progress Notes (Signed)
   Subjective:    Patient ID: Joel Donaldson, male    DOB: September 20, 1940, 74 y.o.   MRN: [id number]   CC : right thumb pain, continued right shoulder pain  HPI 74 y/o male with a significant history of degenerative joint disease. He presents for refill of percocet for pain. He additionally reports new onset right  Thumb pain for the last 2-3 weeks. He denies trauma or inciting factors. Pain is worse with movement, with no particulr increased pain over the joints. Denies swelling, redness, warmth Also reports that he needs a colonoscopy for which he has been managed by another provider and plans to get it this year   Review of Systems   See HPI for ROS. All other systems reviewed and are negative.  Past medical history, surgical, family, and social history reviewed and updated in the EMR as appropriate.  Past Medical History  Diagnosis Date  . Arthritis     hands, back, knees  . Anxiety   . Depression   . GERD (gastroesophageal reflux disease)   . Glaucoma   . Hypertension   . Prostate cancer   . Heart murmur asymptomatic  . Short of breath on exertion   . Asthma   . Nocturia   . Hx of radioisotope therapy 07/26/11    Radiosactive Prostate Seed Implant/ I-125 Seeds  . Chest pain at rest 12/19/2011  . Rheumatoid arthritis 10/10/2012   History  Substance Use Topics  . Smoking status: Former Smoker -- 2.00 packs/day for 50 years    Types: Cigarettes    Quit date: 10/13/2004  . Smokeless tobacco: Never Used     Comment: Still not smoking.   . Alcohol Use: 8.4 oz/week    14 Shots of liquor per week     Comment: occassional     Objective:  BP 130/71 mmHg  Pulse 100  Temp(Src) 97.9 F (36.6 C) (Oral)  Ht 5\' 10"  (1.778 m)  Wt 87.363 kg (192 lb 9.6 oz)  BMI 27.64 kg/m2 Vitals and nursing note reviewed  General: NAD Cardiac: RRR, normal heart sounds, no murmurs. 2+ radial and PT pulses bilaterally Respiratory: CTAB, normal effort Extremities: no edema or cyanosis. WWP.  Pain with right thenar extension as well as flexion of the wrist with ulnar deviation. Mild tenderness to palpation over the MCP and CMC joints. 5/5 strength biilateral hands, normal sensation to light and sharp touch. Decreased range of motion of right shoulder.  Skin: warm and dry, no rashes noted Neuro: alert and oriented, no focal deficits   Assessment & Plan:  See Problem List      Domenica Weightman A. Lincoln Brigham MD, [facility] Resident PGY-1 Pager [phone]

## 2014-04-15 NOTE — Assessment & Plan Note (Signed)
Presents for continued pain in the right shoulder as well as now over is right thumb that started 2-3 weeks ago. Denies trauma and cannot think of a precipitating event. Hurts all day with no waxing or waning periods. Only relieved with percocet which he takes BID, on waking at at lunch time. Will refill percocet at this time

## 2014-04-16 DIAGNOSIS — C61 Malignant neoplasm of prostate: Secondary | ICD-10-CM | POA: Diagnosis not present

## 2014-04-16 DIAGNOSIS — B760 Ancylostomiasis: Secondary | ICD-10-CM | POA: Diagnosis not present

## 2014-04-16 DIAGNOSIS — I10 Essential (primary) hypertension: Secondary | ICD-10-CM | POA: Diagnosis not present

## 2014-04-18 DIAGNOSIS — I10 Essential (primary) hypertension: Secondary | ICD-10-CM | POA: Diagnosis not present

## 2014-04-18 DIAGNOSIS — B760 Ancylostomiasis: Secondary | ICD-10-CM | POA: Diagnosis not present

## 2014-04-18 DIAGNOSIS — C61 Malignant neoplasm of prostate: Secondary | ICD-10-CM | POA: Diagnosis not present

## 2014-04-19 DIAGNOSIS — C61 Malignant neoplasm of prostate: Secondary | ICD-10-CM | POA: Diagnosis not present

## 2014-04-19 DIAGNOSIS — B760 Ancylostomiasis: Secondary | ICD-10-CM | POA: Diagnosis not present

## 2014-04-19 DIAGNOSIS — I10 Essential (primary) hypertension: Secondary | ICD-10-CM | POA: Diagnosis not present

## 2014-04-20 DIAGNOSIS — B760 Ancylostomiasis: Secondary | ICD-10-CM | POA: Diagnosis not present

## 2014-04-20 DIAGNOSIS — C61 Malignant neoplasm of prostate: Secondary | ICD-10-CM | POA: Diagnosis not present

## 2014-04-20 DIAGNOSIS — I10 Essential (primary) hypertension: Secondary | ICD-10-CM | POA: Diagnosis not present

## 2014-04-21 DIAGNOSIS — I10 Essential (primary) hypertension: Secondary | ICD-10-CM | POA: Diagnosis not present

## 2014-04-21 DIAGNOSIS — C61 Malignant neoplasm of prostate: Secondary | ICD-10-CM | POA: Diagnosis not present

## 2014-04-21 DIAGNOSIS — B760 Ancylostomiasis: Secondary | ICD-10-CM | POA: Diagnosis not present

## 2014-04-22 DIAGNOSIS — B760 Ancylostomiasis: Secondary | ICD-10-CM | POA: Diagnosis not present

## 2014-04-22 DIAGNOSIS — C61 Malignant neoplasm of prostate: Secondary | ICD-10-CM | POA: Diagnosis not present

## 2014-04-22 DIAGNOSIS — I10 Essential (primary) hypertension: Secondary | ICD-10-CM | POA: Diagnosis not present

## 2014-04-23 DIAGNOSIS — B760 Ancylostomiasis: Secondary | ICD-10-CM | POA: Diagnosis not present

## 2014-04-23 DIAGNOSIS — I10 Essential (primary) hypertension: Secondary | ICD-10-CM | POA: Diagnosis not present

## 2014-04-23 DIAGNOSIS — C61 Malignant neoplasm of prostate: Secondary | ICD-10-CM | POA: Diagnosis not present

## 2014-04-24 DIAGNOSIS — B760 Ancylostomiasis: Secondary | ICD-10-CM | POA: Diagnosis not present

## 2014-04-24 DIAGNOSIS — I10 Essential (primary) hypertension: Secondary | ICD-10-CM | POA: Diagnosis not present

## 2014-04-24 DIAGNOSIS — C61 Malignant neoplasm of prostate: Secondary | ICD-10-CM | POA: Diagnosis not present

## 2014-04-25 DIAGNOSIS — I10 Essential (primary) hypertension: Secondary | ICD-10-CM | POA: Diagnosis not present

## 2014-04-25 DIAGNOSIS — C61 Malignant neoplasm of prostate: Secondary | ICD-10-CM | POA: Diagnosis not present

## 2014-04-25 DIAGNOSIS — B760 Ancylostomiasis: Secondary | ICD-10-CM | POA: Diagnosis not present

## 2014-04-26 DIAGNOSIS — C61 Malignant neoplasm of prostate: Secondary | ICD-10-CM | POA: Diagnosis not present

## 2014-04-26 DIAGNOSIS — B760 Ancylostomiasis: Secondary | ICD-10-CM | POA: Diagnosis not present

## 2014-04-26 DIAGNOSIS — I10 Essential (primary) hypertension: Secondary | ICD-10-CM | POA: Diagnosis not present

## 2014-04-27 DIAGNOSIS — I10 Essential (primary) hypertension: Secondary | ICD-10-CM | POA: Diagnosis not present

## 2014-04-27 DIAGNOSIS — B760 Ancylostomiasis: Secondary | ICD-10-CM | POA: Diagnosis not present

## 2014-04-27 DIAGNOSIS — C61 Malignant neoplasm of prostate: Secondary | ICD-10-CM | POA: Diagnosis not present

## 2014-04-28 DIAGNOSIS — B760 Ancylostomiasis: Secondary | ICD-10-CM | POA: Diagnosis not present

## 2014-04-28 DIAGNOSIS — I10 Essential (primary) hypertension: Secondary | ICD-10-CM | POA: Diagnosis not present

## 2014-04-28 DIAGNOSIS — C61 Malignant neoplasm of prostate: Secondary | ICD-10-CM | POA: Diagnosis not present

## 2014-04-29 DIAGNOSIS — I10 Essential (primary) hypertension: Secondary | ICD-10-CM | POA: Diagnosis not present

## 2014-04-29 DIAGNOSIS — B760 Ancylostomiasis: Secondary | ICD-10-CM | POA: Diagnosis not present

## 2014-04-29 DIAGNOSIS — C61 Malignant neoplasm of prostate: Secondary | ICD-10-CM | POA: Diagnosis not present

## 2014-04-30 DIAGNOSIS — I10 Essential (primary) hypertension: Secondary | ICD-10-CM | POA: Diagnosis not present

## 2014-04-30 DIAGNOSIS — B760 Ancylostomiasis: Secondary | ICD-10-CM | POA: Diagnosis not present

## 2014-04-30 DIAGNOSIS — C61 Malignant neoplasm of prostate: Secondary | ICD-10-CM | POA: Diagnosis not present

## 2014-05-01 DIAGNOSIS — C61 Malignant neoplasm of prostate: Secondary | ICD-10-CM | POA: Diagnosis not present

## 2014-05-01 DIAGNOSIS — B760 Ancylostomiasis: Secondary | ICD-10-CM | POA: Diagnosis not present

## 2014-05-01 DIAGNOSIS — I10 Essential (primary) hypertension: Secondary | ICD-10-CM | POA: Diagnosis not present

## 2014-05-02 DIAGNOSIS — C61 Malignant neoplasm of prostate: Secondary | ICD-10-CM | POA: Diagnosis not present

## 2014-05-02 DIAGNOSIS — I10 Essential (primary) hypertension: Secondary | ICD-10-CM | POA: Diagnosis not present

## 2014-05-02 DIAGNOSIS — B760 Ancylostomiasis: Secondary | ICD-10-CM | POA: Diagnosis not present

## 2014-05-03 DIAGNOSIS — I10 Essential (primary) hypertension: Secondary | ICD-10-CM | POA: Diagnosis not present

## 2014-05-03 DIAGNOSIS — B760 Ancylostomiasis: Secondary | ICD-10-CM | POA: Diagnosis not present

## 2014-05-03 DIAGNOSIS — C61 Malignant neoplasm of prostate: Secondary | ICD-10-CM | POA: Diagnosis not present

## 2014-05-03 NOTE — Progress Notes (Signed)
I was the preceptor for this encounter. Sonoma Firkus, M.D. 

## 2014-05-03 NOTE — Progress Notes (Signed)
I was the preceptor for this encounter. Rylee Huestis, M.D. 

## 2014-05-05 DIAGNOSIS — C61 Malignant neoplasm of prostate: Secondary | ICD-10-CM | POA: Diagnosis not present

## 2014-05-05 DIAGNOSIS — I10 Essential (primary) hypertension: Secondary | ICD-10-CM | POA: Diagnosis not present

## 2014-05-05 DIAGNOSIS — B760 Ancylostomiasis: Secondary | ICD-10-CM | POA: Diagnosis not present

## 2014-05-06 DIAGNOSIS — B760 Ancylostomiasis: Secondary | ICD-10-CM | POA: Diagnosis not present

## 2014-05-06 DIAGNOSIS — C61 Malignant neoplasm of prostate: Secondary | ICD-10-CM | POA: Diagnosis not present

## 2014-05-06 DIAGNOSIS — I10 Essential (primary) hypertension: Secondary | ICD-10-CM | POA: Diagnosis not present

## 2014-05-07 DIAGNOSIS — C61 Malignant neoplasm of prostate: Secondary | ICD-10-CM | POA: Diagnosis not present

## 2014-05-07 DIAGNOSIS — I10 Essential (primary) hypertension: Secondary | ICD-10-CM | POA: Diagnosis not present

## 2014-05-07 DIAGNOSIS — B760 Ancylostomiasis: Secondary | ICD-10-CM | POA: Diagnosis not present

## 2014-05-08 DIAGNOSIS — I10 Essential (primary) hypertension: Secondary | ICD-10-CM | POA: Diagnosis not present

## 2014-05-08 DIAGNOSIS — B760 Ancylostomiasis: Secondary | ICD-10-CM | POA: Diagnosis not present

## 2014-05-08 DIAGNOSIS — C61 Malignant neoplasm of prostate: Secondary | ICD-10-CM | POA: Diagnosis not present

## 2014-05-09 DIAGNOSIS — C61 Malignant neoplasm of prostate: Secondary | ICD-10-CM | POA: Diagnosis not present

## 2014-05-09 DIAGNOSIS — I10 Essential (primary) hypertension: Secondary | ICD-10-CM | POA: Diagnosis not present

## 2014-05-09 DIAGNOSIS — B760 Ancylostomiasis: Secondary | ICD-10-CM | POA: Diagnosis not present

## 2014-05-10 DIAGNOSIS — I10 Essential (primary) hypertension: Secondary | ICD-10-CM | POA: Diagnosis not present

## 2014-05-10 DIAGNOSIS — B760 Ancylostomiasis: Secondary | ICD-10-CM | POA: Diagnosis not present

## 2014-05-10 DIAGNOSIS — C61 Malignant neoplasm of prostate: Secondary | ICD-10-CM | POA: Diagnosis not present

## 2014-05-11 DIAGNOSIS — B760 Ancylostomiasis: Secondary | ICD-10-CM | POA: Diagnosis not present

## 2014-05-11 DIAGNOSIS — I10 Essential (primary) hypertension: Secondary | ICD-10-CM | POA: Diagnosis not present

## 2014-05-11 DIAGNOSIS — C61 Malignant neoplasm of prostate: Secondary | ICD-10-CM | POA: Diagnosis not present

## 2014-05-12 DIAGNOSIS — C61 Malignant neoplasm of prostate: Secondary | ICD-10-CM | POA: Diagnosis not present

## 2014-05-12 DIAGNOSIS — I10 Essential (primary) hypertension: Secondary | ICD-10-CM | POA: Diagnosis not present

## 2014-05-12 DIAGNOSIS — B760 Ancylostomiasis: Secondary | ICD-10-CM | POA: Diagnosis not present

## 2014-05-14 DIAGNOSIS — B760 Ancylostomiasis: Secondary | ICD-10-CM | POA: Diagnosis not present

## 2014-05-14 DIAGNOSIS — C61 Malignant neoplasm of prostate: Secondary | ICD-10-CM | POA: Diagnosis not present

## 2014-05-14 DIAGNOSIS — I10 Essential (primary) hypertension: Secondary | ICD-10-CM | POA: Diagnosis not present

## 2014-05-15 DIAGNOSIS — B760 Ancylostomiasis: Secondary | ICD-10-CM | POA: Diagnosis not present

## 2014-05-15 DIAGNOSIS — I10 Essential (primary) hypertension: Secondary | ICD-10-CM | POA: Diagnosis not present

## 2014-05-15 DIAGNOSIS — C61 Malignant neoplasm of prostate: Secondary | ICD-10-CM | POA: Diagnosis not present

## 2014-05-16 DIAGNOSIS — I10 Essential (primary) hypertension: Secondary | ICD-10-CM | POA: Diagnosis not present

## 2014-05-16 DIAGNOSIS — B760 Ancylostomiasis: Secondary | ICD-10-CM | POA: Diagnosis not present

## 2014-05-16 DIAGNOSIS — C61 Malignant neoplasm of prostate: Secondary | ICD-10-CM | POA: Diagnosis not present

## 2014-05-17 DIAGNOSIS — I10 Essential (primary) hypertension: Secondary | ICD-10-CM | POA: Diagnosis not present

## 2014-05-17 DIAGNOSIS — B760 Ancylostomiasis: Secondary | ICD-10-CM | POA: Diagnosis not present

## 2014-05-17 DIAGNOSIS — C61 Malignant neoplasm of prostate: Secondary | ICD-10-CM | POA: Diagnosis not present

## 2014-05-18 DIAGNOSIS — I10 Essential (primary) hypertension: Secondary | ICD-10-CM | POA: Diagnosis not present

## 2014-05-18 DIAGNOSIS — B760 Ancylostomiasis: Secondary | ICD-10-CM | POA: Diagnosis not present

## 2014-05-18 DIAGNOSIS — C61 Malignant neoplasm of prostate: Secondary | ICD-10-CM | POA: Diagnosis not present

## 2014-05-19 DIAGNOSIS — I10 Essential (primary) hypertension: Secondary | ICD-10-CM | POA: Diagnosis not present

## 2014-05-19 DIAGNOSIS — B760 Ancylostomiasis: Secondary | ICD-10-CM | POA: Diagnosis not present

## 2014-05-19 DIAGNOSIS — C61 Malignant neoplasm of prostate: Secondary | ICD-10-CM | POA: Diagnosis not present

## 2014-05-20 ENCOUNTER — Encounter: Payer: Self-pay | Admitting: Student

## 2014-05-20 ENCOUNTER — Ambulatory Visit (INDEPENDENT_AMBULATORY_CARE_PROVIDER_SITE_OTHER): Payer: Medicare Other | Admitting: Student

## 2014-05-20 VITALS — BP 118/72 | HR 88 | Temp 98.3°F | Ht 70.0 in | Wt 180.0 lb

## 2014-05-20 DIAGNOSIS — M654 Radial styloid tenosynovitis [de Quervain]: Secondary | ICD-10-CM

## 2014-05-20 DIAGNOSIS — I10 Essential (primary) hypertension: Secondary | ICD-10-CM | POA: Diagnosis not present

## 2014-05-20 DIAGNOSIS — M19019 Primary osteoarthritis, unspecified shoulder: Secondary | ICD-10-CM

## 2014-05-20 DIAGNOSIS — Z0000 Encounter for general adult medical examination without abnormal findings: Secondary | ICD-10-CM | POA: Diagnosis not present

## 2014-05-20 DIAGNOSIS — B760 Ancylostomiasis: Secondary | ICD-10-CM | POA: Diagnosis not present

## 2014-05-20 DIAGNOSIS — C61 Malignant neoplasm of prostate: Secondary | ICD-10-CM | POA: Diagnosis not present

## 2014-05-20 MED ORDER — OXYCODONE-ACETAMINOPHEN 10-325 MG PO TABS: 1.0000 | ORAL_TABLET | Freq: Two times a day (BID) | ORAL | Status: DC

## 2014-05-20 NOTE — Progress Notes (Signed)
One of the available preceptor.

## 2014-05-20 NOTE — Assessment & Plan Note (Signed)
Continues to have baseline pain. An Rx for 3 month supply of Percocet given. Will call for any issues or if he needs more medication

## 2014-05-20 NOTE — Progress Notes (Signed)
Subjective:    Patient ID: Joel Donaldson, male    DOB: 11-Nov-1940, 74 y.o.   MRN: [id number]   CC: Follow up right dequervains tenosynovitis, persistent bilateral knee pain  HPI  74 y/o M with PMH sig for DJD, HTN recent dequervain's tenosynovitis   Dequervain's tenosynovitis - Greatly improved with use of Spica splint with NSAID course. Now occasionally uses splint  DJD - Chronically uses Percocet for joint pain and is now out and requests a refill. Has significant knee pai, not different from baseline, but increasingly bothersome. Still has normal use and range of motion, no weakness but occasionally needs to take a percocet at night for the pain to allow him to sleep  Colonoscopy  -Scheduled in 2 weeks, cannot remember the last time he had a colonoscopy   Review of Systems   See HPI for ROS. All other systems reviewed and are negative.  Past medical history, surgical, family, and social history reviewed and updated in the EMR as appropriate.  Past Medical History  Diagnosis Date  . Arthritis     hands, back, knees  . Anxiety   . Depression   . GERD (gastroesophageal reflux disease)   . Glaucoma   . Hypertension   . Prostate cancer   . Heart murmur asymptomatic  . Short of breath on exertion   . Asthma   . Nocturia   . Hx of radioisotope therapy 07/26/11    Radiosactive Prostate Seed Implant/ I-125 Seeds  . Chest pain at rest 12/19/2011  . Rheumatoid arthritis 10/10/2012   Past Surgical History  Procedure Laterality Date  . Shoulder surgery  YRS AGO    LEFT  . Lumbar fusion  1995  . Radioactive seed implant  09/14/2011    Procedure: RADIOACTIVE SEED IMPLANT;  Surgeon: Hanley Ben, MD;  Location: [facility];  Service: Urology;  Laterality: N/A;  Total number of seeds -    . Cystoscopy  09/14/2011    Procedure: CYSTOSCOPY FLEXIBLE;  Surgeon: Hanley Ben, MD;  Location: [facility];  Service: Urology;  Laterality: N/A;  No  seeds seen in the bladder     History   Social History  . Marital Status: Legally Separated    Spouse Name: Deneise Lever  . Number of Children: 1  . Years of Education: 11   Occupational History  . Retired    Social History Main Topics  . Smoking status: Former Smoker -- 2.00 packs/day for 50 years    Types: Cigarettes    Quit date: 10/13/2004  . Smokeless tobacco: Never Used     Comment: Still not smoking.   . Alcohol Use: 8.4 oz/week    14 Shots of liquor per week     Comment: occassional  . Drug Use: No  . Sexual Activity: Not on file   Other Topics Concern  . Not on file   Social History Narrative   Lives 1-story home with granddaughter and great-granddaughter.    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       Health Care POA:    Emergency Contact: wife, Tedrick Port (c) [phone]   End of Life Plan:    Who lives with you: wife and granddaughter   Any pets: 2 dogs, Precious and Spike   Diet: Pt has a varied diet of protein, starch, vegetables.  Pt does not have any kind of modified diet, eats what he likes.   Exercise:  Pt does not have regular exercise routine.   Seatbelts: Pt reports wearing seatbelt when in vehicles.    Sun Exposure/Protection:    Hobbies: fishing, hunting           Objective:  BP 118/72 mmHg  Pulse 88  Temp(Src) 98.3 F (36.8 C) (Oral)  Ht 5\' 10"  (1.778 m)  Wt 180 lb (81.647 kg)  BMI 25.83 kg/m2 Vitals and nursing note reviewed  General: NAD Cardiac: RRR, normal heart sounds, no murmurs. Respiratory: CTAB, normal effort Abdomen: soft, nontender, nondistended. Bowel sounds present Extremities: no edema or cyanosis. WWP. 4/5 LE strength bilaterally, normal ROM Skin: warm and dry, no rashes noted Neuro: alert and oriented, no focal deficits   Assessment & Plan:  See Problem List      Hyder Deman A. Lincoln Brigham MD, [facility] Resident PGY-1 Pager [phone]

## 2014-05-20 NOTE — Assessment & Plan Note (Signed)
Colonoscopy scheduled for mid April.  He is due for the Zostavax. An Rx was given to him to obtain at his pharmacy

## 2014-05-20 NOTE — Assessment & Plan Note (Signed)
Pain much improved with course of NSAIDs and spica splint. No longer needs NSAIDs. Will now use the spica splint as needed. Will follow up with additional issues

## 2014-05-20 NOTE — Patient Instructions (Signed)
Please follow up in 3 months or earlier as needed Please follow up at you pharmacy, [facility], for Zoster Vaccine. Please follow up for colonoscopy and bring results back to clinic at your next appointment

## 2014-05-20 NOTE — Assessment & Plan Note (Addendum)
Stable. Continue lisinopril-HCTZ

## 2014-05-21 DIAGNOSIS — B760 Ancylostomiasis: Secondary | ICD-10-CM | POA: Diagnosis not present

## 2014-05-21 DIAGNOSIS — I10 Essential (primary) hypertension: Secondary | ICD-10-CM | POA: Diagnosis not present

## 2014-05-21 DIAGNOSIS — C61 Malignant neoplasm of prostate: Secondary | ICD-10-CM | POA: Diagnosis not present

## 2014-05-22 DIAGNOSIS — I10 Essential (primary) hypertension: Secondary | ICD-10-CM | POA: Diagnosis not present

## 2014-05-22 DIAGNOSIS — C61 Malignant neoplasm of prostate: Secondary | ICD-10-CM | POA: Diagnosis not present

## 2014-05-22 DIAGNOSIS — B760 Ancylostomiasis: Secondary | ICD-10-CM | POA: Diagnosis not present

## 2014-05-23 DIAGNOSIS — I10 Essential (primary) hypertension: Secondary | ICD-10-CM | POA: Diagnosis not present

## 2014-05-23 DIAGNOSIS — B760 Ancylostomiasis: Secondary | ICD-10-CM | POA: Diagnosis not present

## 2014-05-23 DIAGNOSIS — C61 Malignant neoplasm of prostate: Secondary | ICD-10-CM | POA: Diagnosis not present

## 2014-05-24 DIAGNOSIS — I10 Essential (primary) hypertension: Secondary | ICD-10-CM | POA: Diagnosis not present

## 2014-05-25 DIAGNOSIS — I10 Essential (primary) hypertension: Secondary | ICD-10-CM | POA: Diagnosis not present

## 2014-05-26 DIAGNOSIS — I10 Essential (primary) hypertension: Secondary | ICD-10-CM | POA: Diagnosis not present

## 2014-05-27 DIAGNOSIS — I10 Essential (primary) hypertension: Secondary | ICD-10-CM | POA: Diagnosis not present

## 2014-05-28 DIAGNOSIS — I10 Essential (primary) hypertension: Secondary | ICD-10-CM | POA: Diagnosis not present

## 2014-05-29 DIAGNOSIS — I10 Essential (primary) hypertension: Secondary | ICD-10-CM | POA: Diagnosis not present

## 2014-05-30 DIAGNOSIS — I10 Essential (primary) hypertension: Secondary | ICD-10-CM | POA: Diagnosis not present

## 2014-05-31 DIAGNOSIS — I10 Essential (primary) hypertension: Secondary | ICD-10-CM | POA: Diagnosis not present

## 2014-06-01 DIAGNOSIS — I10 Essential (primary) hypertension: Secondary | ICD-10-CM | POA: Diagnosis not present

## 2014-06-02 DIAGNOSIS — I10 Essential (primary) hypertension: Secondary | ICD-10-CM | POA: Diagnosis not present

## 2014-06-03 DIAGNOSIS — I10 Essential (primary) hypertension: Secondary | ICD-10-CM | POA: Diagnosis not present

## 2014-06-04 DIAGNOSIS — I10 Essential (primary) hypertension: Secondary | ICD-10-CM | POA: Diagnosis not present

## 2014-06-05 DIAGNOSIS — I10 Essential (primary) hypertension: Secondary | ICD-10-CM | POA: Diagnosis not present

## 2014-06-06 DIAGNOSIS — I10 Essential (primary) hypertension: Secondary | ICD-10-CM | POA: Diagnosis not present

## 2014-06-07 DIAGNOSIS — I10 Essential (primary) hypertension: Secondary | ICD-10-CM | POA: Diagnosis not present

## 2014-06-08 ENCOUNTER — Other Ambulatory Visit: Payer: Self-pay | Admitting: Student

## 2014-06-08 DIAGNOSIS — I10 Essential (primary) hypertension: Secondary | ICD-10-CM | POA: Diagnosis not present

## 2014-06-09 DIAGNOSIS — I10 Essential (primary) hypertension: Secondary | ICD-10-CM | POA: Diagnosis not present

## 2014-06-10 DIAGNOSIS — I10 Essential (primary) hypertension: Secondary | ICD-10-CM | POA: Diagnosis not present

## 2014-06-11 DIAGNOSIS — I10 Essential (primary) hypertension: Secondary | ICD-10-CM | POA: Diagnosis not present

## 2014-06-12 DIAGNOSIS — I10 Essential (primary) hypertension: Secondary | ICD-10-CM | POA: Diagnosis not present

## 2014-06-13 DIAGNOSIS — I10 Essential (primary) hypertension: Secondary | ICD-10-CM | POA: Diagnosis not present

## 2014-06-14 DIAGNOSIS — I10 Essential (primary) hypertension: Secondary | ICD-10-CM | POA: Diagnosis not present

## 2014-06-15 DIAGNOSIS — I10 Essential (primary) hypertension: Secondary | ICD-10-CM | POA: Diagnosis not present

## 2014-06-16 DIAGNOSIS — I10 Essential (primary) hypertension: Secondary | ICD-10-CM | POA: Diagnosis not present

## 2014-06-17 ENCOUNTER — Encounter (HOSPITAL_COMMUNITY): Payer: Self-pay | Admitting: Emergency Medicine

## 2014-06-17 ENCOUNTER — Emergency Department (HOSPITAL_COMMUNITY)
Admission: EM | Admit: 2014-06-17 | Discharge: 2014-06-17 | Disposition: A | Discharge status: Home / Self Care | Payer: Medicare Other | Attending: Emergency Medicine | Admitting: Emergency Medicine

## 2014-06-17 ENCOUNTER — Emergency Department (HOSPITAL_COMMUNITY): Payer: Medicare Other

## 2014-06-17 DIAGNOSIS — Z8669 Personal history of other diseases of the nervous system and sense organs: Secondary | ICD-10-CM | POA: Insufficient documentation

## 2014-06-17 DIAGNOSIS — M069 Rheumatoid arthritis, unspecified: Secondary | ICD-10-CM | POA: Insufficient documentation

## 2014-06-17 DIAGNOSIS — Z79899 Other long term (current) drug therapy: Secondary | ICD-10-CM | POA: Diagnosis not present

## 2014-06-17 DIAGNOSIS — Z923 Personal history of irradiation: Secondary | ICD-10-CM | POA: Diagnosis not present

## 2014-06-17 DIAGNOSIS — R011 Cardiac murmur, unspecified: Secondary | ICD-10-CM | POA: Diagnosis not present

## 2014-06-17 DIAGNOSIS — Z7982 Long term (current) use of aspirin: Secondary | ICD-10-CM | POA: Diagnosis not present

## 2014-06-17 DIAGNOSIS — M159 Polyosteoarthritis, unspecified: Secondary | ICD-10-CM | POA: Insufficient documentation

## 2014-06-17 DIAGNOSIS — I10 Essential (primary) hypertension: Secondary | ICD-10-CM | POA: Diagnosis not present

## 2014-06-17 DIAGNOSIS — Z791 Long term (current) use of non-steroidal anti-inflammatories (NSAID): Secondary | ICD-10-CM | POA: Insufficient documentation

## 2014-06-17 DIAGNOSIS — J45909 Unspecified asthma, uncomplicated: Secondary | ICD-10-CM | POA: Insufficient documentation

## 2014-06-17 DIAGNOSIS — Z8546 Personal history of malignant neoplasm of prostate: Secondary | ICD-10-CM | POA: Insufficient documentation

## 2014-06-17 DIAGNOSIS — Z87891 Personal history of nicotine dependence: Secondary | ICD-10-CM | POA: Diagnosis not present

## 2014-06-17 DIAGNOSIS — K219 Gastro-esophageal reflux disease without esophagitis: Secondary | ICD-10-CM | POA: Insufficient documentation

## 2014-06-17 DIAGNOSIS — R0789 Other chest pain: Secondary | ICD-10-CM | POA: Insufficient documentation

## 2014-06-17 DIAGNOSIS — Z8659 Personal history of other mental and behavioral disorders: Secondary | ICD-10-CM | POA: Insufficient documentation

## 2014-06-17 DIAGNOSIS — R079 Chest pain, unspecified: Secondary | ICD-10-CM | POA: Diagnosis not present

## 2014-06-17 LAB — COMPREHENSIVE METABOLIC PANEL
ALK PHOS: 55 U/L (ref 38–126)
ALT: 24 U/L (ref 17–63)
ANION GAP: 4 — AB (ref 5–15)
AST: 23 U/L (ref 15–41)
Albumin: 3.8 g/dL (ref 3.5–5.0)
BUN: 11 mg/dL (ref 6–20)
CO2: 30 mmol/L (ref 22–32)
Calcium: 8.7 mg/dL — ABNORMAL LOW (ref 8.9–10.3)
Chloride: 103 mmol/L (ref 101–111)
Creatinine, Ser: 0.84 mg/dL (ref 0.61–1.24)
Glucose, Bld: 104 mg/dL — ABNORMAL HIGH (ref 70–99)
Potassium: 3.8 mmol/L (ref 3.5–5.1)
SODIUM: 137 mmol/L (ref 135–145)
Total Bilirubin: 0.6 mg/dL (ref 0.3–1.2)
Total Protein: 6.7 g/dL (ref 6.5–8.1)

## 2014-06-17 LAB — CBC WITH DIFFERENTIAL/PLATELET
BASOS ABS: 0 10*3/uL (ref 0.0–0.1)
BASOS PCT: 0 % (ref 0–1)
EOS ABS: 0 10*3/uL (ref 0.0–0.7)
Eosinophils Relative: 1 % (ref 0–5)
HCT: 41.6 % (ref 39.0–52.0)
Hemoglobin: 13.6 g/dL (ref 13.0–17.0)
Lymphocytes Relative: 29 % (ref 12–46)
Lymphs Abs: 1.1 10*3/uL (ref 0.7–4.0)
MCH: 27.8 pg (ref 26.0–34.0)
MCHC: 32.7 g/dL (ref 30.0–36.0)
MCV: 84.9 fL (ref 78.0–100.0)
Monocytes Absolute: 0.3 10*3/uL (ref 0.1–1.0)
Monocytes Relative: 7 % (ref 3–12)
Neutro Abs: 2.5 10*3/uL (ref 1.7–7.7)
Neutrophils Relative %: 63 % (ref 43–77)
Platelets: 200 10*3/uL (ref 150–400)
RBC: 4.9 MIL/uL (ref 4.22–5.81)
RDW: 16.3 % — ABNORMAL HIGH (ref 11.5–15.5)
WBC: 3.9 10*3/uL — ABNORMAL LOW (ref 4.0–10.5)

## 2014-06-17 LAB — I-STAT TROPONIN, ED: TROPONIN I, POC: 0 ng/mL (ref 0.00–0.08)

## 2014-06-17 MED ORDER — ONDANSETRON 4 MG PO TBDP: 4.0000 mg | ORAL_TABLET | Freq: Three times a day (TID) | ORAL | Status: DC | PRN

## 2014-06-17 MED ORDER — HYDROCODONE-ACETAMINOPHEN 5-325 MG PO TABS: 1.0000 | ORAL_TABLET | Freq: Four times a day (QID) | ORAL | Status: DC | PRN

## 2014-06-17 MED ORDER — FENTANYL CITRATE (PF) 100 MCG/2ML IJ SOLN
50.0000 ug | Freq: Once | INTRAMUSCULAR | Status: AC
  Administered 2014-06-17: 50 ug via INTRAVENOUS
  Filled 2014-06-17: qty 2

## 2014-06-17 NOTE — ED Notes (Signed)
Per pt, states right chest pain radiating to right arm, started at 2 am-no N/V

## 2014-06-17 NOTE — Discharge Instructions (Signed)

## 2014-06-17 NOTE — ED Provider Notes (Addendum)
TIME SEEN: 9:55 AM  CHIEF COMPLAINT: Right-sided chest pain  HPI: Pt is a 74 y.o. male with history of hypertension, prostate cancer status post radioactive seed implants in 2013, rheumatoid arthritis not on any immune suppressants who presents to the emergency department with complaints of right-sided chest pain that started at 2 AM. Patient reports he was awake when the pain started. Describes it as a sharp pain but also an aching pain without radiation. States it's worse with movement and palpation. Reports that it is intermittently last for several minutes at a time but never completely resolves. Denies any association with this of breath, nausea or vomiting, diaphoresis, lightheadedness. Denies a history is exertional or pleuritic. Denies history of CAD, CHF. No history of PE or DVT. No recent prolonged immobilization such as long flight or hospitalization, fracture, surgery, trauma. Denies history of stents. Patient quit smoking in 2006. States he has had similar pain once before but cannot remember what he was diagnosed with. Denies fever or cough.  ROS: See HPI Constitutional: no fever  Eyes: no drainage  ENT: no runny nose   Cardiovascular:  Right-sided chest pain  Resp: no SOB  GI: no vomiting GU: no dysuria Integumentary: no rash  Allergy: no hives  Musculoskeletal: no leg swelling  Neurological: no slurred speech ROS otherwise negative  PAST MEDICAL HISTORY/PAST SURGICAL HISTORY:  Past Medical History  Diagnosis Date  . Arthritis     hands, back, knees  . Anxiety   . Depression   . GERD (gastroesophageal reflux disease)   . Glaucoma   . Hypertension   . Prostate cancer   . Heart murmur asymptomatic  . Short of breath on exertion   . Asthma   . Nocturia   . Hx of radioisotope therapy 07/26/11    Radiosactive Prostate Seed Implant/ I-125 Seeds  . Chest pain at rest 12/19/2011  . Rheumatoid arthritis 10/10/2012    MEDICATIONS:  Prior to Admission medications    Medication Sig Start Date End Date Taking? Authorizing Provider  albuterol (PROVENTIL HFA;VENTOLIN HFA) 108 (90 BASE) MCG/ACT inhaler Inhale 2 puffs into the lungs every 6 (six) hours as needed for wheezing or shortness of breath (wheezing).    Historical Provider, MD  ALREX 0.2 % SUSP Place 2 drops into both eyes 2 (two) times daily.  02/22/12   Historical Provider, MD  aspirin 81 MG EC tablet Take 81 mg by mouth daily.     Historical Provider, MD  ergocalciferol (VITAMIN D2) 50000 UNITS capsule Take 1 capsule (50,000 Units total) by mouth once a week. 07/07/13   Waldemar Dickens, MD  ibuprofen (MOTRIN IB) 200 MG tablet Take 3 tablets (600 mg total) by mouth every 6 (six) hours. 04/15/14 04/25/15  Veatrice Bourbon, MD  lisinopril-hydrochlorothiazide (PRINZIDE,ZESTORETIC) 20-12.5 MG per tablet TAKE 1 TABLET BY MOUTH DAILY 06/08/14   Veatrice Bourbon, MD  naproxen sodium (ANAPROX) 220 MG tablet Take 220 mg by mouth 2 (two) times daily with a meal.    Historical Provider, MD  nitroGLYCERIN (NITROSTAT) 0.4 MG SL tablet Place 0.4 mg under the tongue every 5 (five) minutes as needed for chest pain (chest pain).  12/20/11   Lockhart, MD  omeprazole (PRILOSEC) 40 MG capsule Take 1 capsule (40 mg total) by mouth every morning. 12/21/13   Nila Nephew, MD  oxyCODONE-acetaminophen (PERCOCET) 10-325 MG per tablet Take 1 tablet by mouth 2 (two) times daily. 05/20/14   Veatrice Bourbon, MD    ALLERGIES:  Allergies  Allergen Reactions  . Pollen Extract     Sneezing  . Tramadol Nausea Only    SOCIAL HISTORY:  History  Substance Use Topics  . Smoking status: Former Smoker -- 2.00 packs/day for 50 years    Types: Cigarettes    Quit date: 10/13/2004  . Smokeless tobacco: Never Used     Comment: Still not smoking.   . Alcohol Use: 8.4 oz/week    14 Shots of liquor per week     Comment: occassional    FAMILY HISTORY: Family History  Problem Relation Age of Onset  . Diabetes Mother     EXAM: BP  152/91 mmHg  Pulse 82  Temp(Src) 98.1 F (36.7 C) (Oral)  Resp 20  SpO2 100% CONSTITUTIONAL: Alert and oriented and responds appropriately to questions. Well-appearing; well-nourished HEAD: Normocephalic EYES: Conjunctivae clear, PERRL ENT: normal nose; no rhinorrhea; moist mucous membranes; pharynx without lesions noted NECK: Supple, no meningismus, no LAD  CARD: RRR; S1 and S2 appreciated; no murmurs, no clicks, no rubs, no gallops RESP: Normal chest excursion without splinting or tachypnea; breath sounds clear and equal bilaterally; no wheezes, no rhonchi, no rales, no hypoxia or respiratory distress, speaking full sentences; tender to palpation over the right chest wall without crepitus or ecchymosis or deformity, palpation of the right chest wall reproduces his pain ABD/GI: Normal bowel sounds; non-distended; soft, non-tender, no rebound, no guarding, no peritoneal signs BACK:  The back appears normal and is non-tender to palpation, there is no CVA tenderness EXT: Normal ROM in all joints; non-tender to palpation; no edema; normal capillary refill; no cyanosis, no calf tenderness or swelling    SKIN: Normal color for age and race; warm NEURO: Moves all extremities equally, no facial droop or slurred speech PSYCH: The patient's mood and manner are appropriate. Grooming and personal hygiene are appropriate.  MEDICAL DECISION MAKING: Patient here with atypical right-sided chest pain. He is hemodynamically stable on his EKG shows no ischemic changes, interval changes or arrhythmia. He has no risk factors for pulmonary embolus other than a previous history of prostate cancer and age but is not tachycardic, tachypneic or hypoxic and denies that this pain is pleuritic in nature and is not short of breath. I have low suspicion for ACS given I am able to reproduce pain with palpation. Reports that his pain waxes and wanes but is never been completely gone since 2 AM. Will obtain labs including  troponin. We'll also obtain chest x-ray. Will give Bentyl for pain. Given pain is been present since 2 AM if first troponin is negative I do not feel he needs serial enzymes.  ED PROGRESS: 11:15 AM  Pt reports his pain is gone after one dose of IV fentanyl. His labs are unremarkable including negative troponin and normal chest x-ray. I feel he is safe to be discharged home. We'll discharge with prescription for Vicodin to take as needed at home for pain. Have discussed return precautions. Patient and his family at bedside verbalize understanding and are comfortable with this plan.     EKG Interpretation  Date/Time:  Thursday Jun 17 2014 09:49:23 EDT Ventricular Rate:  75 PR Interval:  164 QRS Duration: 87 QT Interval:  411 QTC Calculation: 459 R Axis:   -7 Text Interpretation:  Sinus rhythm Probable left atrial enlargement Baseline wander in lead(s) III aVF V5 No significant change since last tracing Confirmed by WARD,  DO, KRISTEN ([id number]) on 06/17/2014 9:52:39 AM  [place], DO 06/17/14 [place], DO 06/17/14 1116

## 2014-06-17 NOTE — ED Notes (Signed)
Patient transported to X-ray 

## 2014-06-17 NOTE — ED Notes (Signed)
MD at bedside. 

## 2014-06-18 DIAGNOSIS — I10 Essential (primary) hypertension: Secondary | ICD-10-CM | POA: Diagnosis not present

## 2014-06-19 DIAGNOSIS — I10 Essential (primary) hypertension: Secondary | ICD-10-CM | POA: Diagnosis not present

## 2014-06-20 DIAGNOSIS — I10 Essential (primary) hypertension: Secondary | ICD-10-CM | POA: Diagnosis not present

## 2014-06-21 DIAGNOSIS — I10 Essential (primary) hypertension: Secondary | ICD-10-CM | POA: Diagnosis not present

## 2014-06-22 DIAGNOSIS — I10 Essential (primary) hypertension: Secondary | ICD-10-CM | POA: Diagnosis not present

## 2014-06-23 DIAGNOSIS — I10 Essential (primary) hypertension: Secondary | ICD-10-CM | POA: Diagnosis not present

## 2014-06-24 DIAGNOSIS — I10 Essential (primary) hypertension: Secondary | ICD-10-CM | POA: Diagnosis not present

## 2014-06-25 DIAGNOSIS — I10 Essential (primary) hypertension: Secondary | ICD-10-CM | POA: Diagnosis not present

## 2014-06-26 DIAGNOSIS — I10 Essential (primary) hypertension: Secondary | ICD-10-CM | POA: Diagnosis not present

## 2014-06-27 DIAGNOSIS — I10 Essential (primary) hypertension: Secondary | ICD-10-CM | POA: Diagnosis not present

## 2014-06-28 DIAGNOSIS — I10 Essential (primary) hypertension: Secondary | ICD-10-CM | POA: Diagnosis not present

## 2014-06-29 DIAGNOSIS — I10 Essential (primary) hypertension: Secondary | ICD-10-CM | POA: Diagnosis not present

## 2014-06-30 DIAGNOSIS — I10 Essential (primary) hypertension: Secondary | ICD-10-CM | POA: Diagnosis not present

## 2014-07-01 DIAGNOSIS — I10 Essential (primary) hypertension: Secondary | ICD-10-CM | POA: Diagnosis not present

## 2014-07-02 DIAGNOSIS — I10 Essential (primary) hypertension: Secondary | ICD-10-CM | POA: Diagnosis not present

## 2014-07-03 DIAGNOSIS — I10 Essential (primary) hypertension: Secondary | ICD-10-CM | POA: Diagnosis not present

## 2014-07-04 DIAGNOSIS — I10 Essential (primary) hypertension: Secondary | ICD-10-CM | POA: Diagnosis not present

## 2014-07-05 DIAGNOSIS — I10 Essential (primary) hypertension: Secondary | ICD-10-CM | POA: Diagnosis not present

## 2014-07-06 DIAGNOSIS — I10 Essential (primary) hypertension: Secondary | ICD-10-CM | POA: Diagnosis not present

## 2014-07-07 DIAGNOSIS — I10 Essential (primary) hypertension: Secondary | ICD-10-CM | POA: Diagnosis not present

## 2014-07-08 DIAGNOSIS — I10 Essential (primary) hypertension: Secondary | ICD-10-CM | POA: Diagnosis not present

## 2014-07-13 ENCOUNTER — Ambulatory Visit (INDEPENDENT_AMBULATORY_CARE_PROVIDER_SITE_OTHER): Payer: Medicare Other | Admitting: Student

## 2014-07-13 ENCOUNTER — Encounter: Payer: Self-pay | Admitting: Student

## 2014-07-13 VITALS — BP 158/82 | HR 80 | Temp 98.6°F | Ht 70.0 in | Wt 187.0 lb

## 2014-07-13 DIAGNOSIS — M19019 Primary osteoarthritis, unspecified shoulder: Secondary | ICD-10-CM | POA: Diagnosis not present

## 2014-07-13 MED ORDER — OXYCODONE-ACETAMINOPHEN 10-325 MG PO TABS: 1.0000 | ORAL_TABLET | Freq: Two times a day (BID) | ORAL | Status: DC

## 2014-07-13 MED ORDER — ACETAMINOPHEN 325 MG PO TABS: 650.0000 mg | ORAL_TABLET | Freq: Four times a day (QID) | ORAL | Status: DC | PRN

## 2014-07-13 NOTE — Assessment & Plan Note (Signed)
Stable right shoulder and bilateral knee pain controlled with percocet. There is some concern about the amount of narcotics he is requiring especially in the setting of his past history of etoh and tobacco abuse and his risk for abusing this medication. He does have a very valid reason for pain in the setting of extensive DJD but alternative treatment options are needed - Discussed use of non-narcotic medications for pain such as tylenol and motrin, then if these fail to take percocet - Discuss swimming/PT to help with pain - Will refill percocet today #90 tabs - consider UDS at next visit, last on 2014 was negative

## 2014-07-13 NOTE — Patient Instructions (Signed)
Return in 1 month Please take tylenol prior to taking to percocet Take Percocet if pain not relieved with tylenol

## 2014-07-13 NOTE — Progress Notes (Signed)
Subjective:    Patient ID: Joel Donaldson, male    DOB: 1940-05-13, 74 y.o.   MRN: [id number]   CC: ED follow up  HPI  74 y/o with PMH of DJD, HTN presents for ED follow up for chest pain  Chest pain Went to the ED on 5/5 for right sided chest pain that woke him from sleep. He did not have SOB associated. ACS work up was negative. His pain resolved with fentanyl and was thought to be secondary to musculoskeletal pain Today denies chest pain SOB, HA/dizziness and denies having pain like that again.  DJD Continues to endorse stable right shoulder, and bilateral knee pain. This does not limit his function and he is able to ambulate occasionally it the use of a cane. Does not have stairs in his house. His granddaughter lives with him and helps with cooking, cleaning and shopping    Review of Systems   See HPI for ROS.     Past Medical History  Diagnosis Date  . Arthritis     hands, back, knees  . Anxiety   . Depression   . GERD (gastroesophageal reflux disease)   . Glaucoma   . Hypertension   . Prostate cancer   . Heart murmur asymptomatic  . Short of breath on exertion   . Asthma   . Nocturia   . Hx of radioisotope therapy 07/26/11    Radiosactive Prostate Seed Implant/ I-125 Seeds  . Chest pain at rest 12/19/2011  . Rheumatoid arthritis 10/10/2012    History   Social History  . Marital Status: Legally Separated    Spouse Name: Deneise Lever  . Number of Children: 1  . Years of Education: 11   Occupational History  . Retired    Social History Main Topics  . Smoking status: Former Smoker -- 2.00 packs/day for 50 years    Types: Cigarettes    Quit date: 10/13/2004  . Smokeless tobacco: Never Used     Comment: Still not smoking.   . Alcohol Use: 8.4 oz/week    14 Shots of liquor per week     Comment: occassional  . Drug Use: No  . Sexual Activity: Not on file   Other Topics Concern  . Not on file   Social History Narrative   Lives 1-story home with  granddaughter and great-granddaughter.    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       Health Care POA:    Emergency Contact: wife, Huntington Leverich (c) [phone]   End of Life Plan:    Who lives with you: wife and granddaughter   Any pets: 2 dogs, Precious and Spike   Diet: Pt has a varied diet of protein, starch, vegetables.  Pt does not have any kind of modified diet, eats what he likes.   Exercise: Pt does not have regular exercise routine.   Seatbelts: Pt reports wearing seatbelt when in vehicles.    Sun Exposure/Protection:    Hobbies: fishing, hunting           Objective:  BP 158/82 mmHg  Pulse 80  Temp(Src) 98.6 F (37 C) (Oral)  Ht 5\' 10"  (1.778 m)  Wt 187 lb (84.823 kg)  BMI 26.83 kg/m2 Vitals and nursing note reviewed  General: NAD Cardiac: RRR, normal heart sounds Respiratory: CTAB, normal effort Abdomen: soft, nontender, nondistended Extremities: no edema or cyanosis. WWP. Skin: warm and dry, no rashes noted Neuro: alert and oriented,  no focal deficits   Assessment & Plan:  See Problem List      Alyssa A. Lincoln Brigham MD, [facility] Resident PGY-1 Pager [phone]

## 2014-07-14 DIAGNOSIS — I10 Essential (primary) hypertension: Secondary | ICD-10-CM | POA: Diagnosis not present

## 2014-07-15 DIAGNOSIS — I10 Essential (primary) hypertension: Secondary | ICD-10-CM | POA: Diagnosis not present

## 2014-07-16 DIAGNOSIS — I10 Essential (primary) hypertension: Secondary | ICD-10-CM | POA: Diagnosis not present

## 2014-07-17 DIAGNOSIS — I10 Essential (primary) hypertension: Secondary | ICD-10-CM | POA: Diagnosis not present

## 2014-07-18 DIAGNOSIS — I10 Essential (primary) hypertension: Secondary | ICD-10-CM | POA: Diagnosis not present

## 2014-07-19 DIAGNOSIS — I10 Essential (primary) hypertension: Secondary | ICD-10-CM | POA: Diagnosis not present

## 2014-07-20 DIAGNOSIS — I10 Essential (primary) hypertension: Secondary | ICD-10-CM | POA: Diagnosis not present

## 2014-07-21 DIAGNOSIS — I10 Essential (primary) hypertension: Secondary | ICD-10-CM | POA: Diagnosis not present

## 2014-07-22 DIAGNOSIS — I10 Essential (primary) hypertension: Secondary | ICD-10-CM | POA: Diagnosis not present

## 2014-07-23 DIAGNOSIS — I10 Essential (primary) hypertension: Secondary | ICD-10-CM | POA: Diagnosis not present

## 2014-07-24 DIAGNOSIS — I10 Essential (primary) hypertension: Secondary | ICD-10-CM | POA: Diagnosis not present

## 2014-07-25 DIAGNOSIS — I10 Essential (primary) hypertension: Secondary | ICD-10-CM | POA: Diagnosis not present

## 2014-07-26 DIAGNOSIS — I10 Essential (primary) hypertension: Secondary | ICD-10-CM | POA: Diagnosis not present

## 2014-07-27 DIAGNOSIS — I10 Essential (primary) hypertension: Secondary | ICD-10-CM | POA: Diagnosis not present

## 2014-07-28 DIAGNOSIS — I10 Essential (primary) hypertension: Secondary | ICD-10-CM | POA: Diagnosis not present

## 2014-07-29 DIAGNOSIS — I10 Essential (primary) hypertension: Secondary | ICD-10-CM | POA: Diagnosis not present

## 2014-07-30 DIAGNOSIS — I10 Essential (primary) hypertension: Secondary | ICD-10-CM | POA: Diagnosis not present

## 2014-07-31 DIAGNOSIS — I10 Essential (primary) hypertension: Secondary | ICD-10-CM | POA: Diagnosis not present

## 2014-08-01 DIAGNOSIS — I10 Essential (primary) hypertension: Secondary | ICD-10-CM | POA: Diagnosis not present

## 2014-08-02 DIAGNOSIS — I10 Essential (primary) hypertension: Secondary | ICD-10-CM | POA: Diagnosis not present

## 2014-08-03 DIAGNOSIS — I10 Essential (primary) hypertension: Secondary | ICD-10-CM | POA: Diagnosis not present

## 2014-08-04 DIAGNOSIS — I10 Essential (primary) hypertension: Secondary | ICD-10-CM | POA: Diagnosis not present

## 2014-08-05 DIAGNOSIS — I10 Essential (primary) hypertension: Secondary | ICD-10-CM | POA: Diagnosis not present

## 2014-08-06 DIAGNOSIS — I10 Essential (primary) hypertension: Secondary | ICD-10-CM | POA: Diagnosis not present

## 2014-08-07 DIAGNOSIS — I10 Essential (primary) hypertension: Secondary | ICD-10-CM | POA: Diagnosis not present

## 2014-08-08 DIAGNOSIS — I10 Essential (primary) hypertension: Secondary | ICD-10-CM | POA: Diagnosis not present

## 2014-08-09 DIAGNOSIS — I10 Essential (primary) hypertension: Secondary | ICD-10-CM | POA: Diagnosis not present

## 2014-08-10 DIAGNOSIS — I10 Essential (primary) hypertension: Secondary | ICD-10-CM | POA: Diagnosis not present

## 2014-08-11 DIAGNOSIS — H04123 Dry eye syndrome of bilateral lacrimal glands: Secondary | ICD-10-CM | POA: Diagnosis not present

## 2014-08-11 DIAGNOSIS — H25813 Combined forms of age-related cataract, bilateral: Secondary | ICD-10-CM | POA: Diagnosis not present

## 2014-08-11 DIAGNOSIS — H10413 Chronic giant papillary conjunctivitis, bilateral: Secondary | ICD-10-CM | POA: Diagnosis not present

## 2014-08-11 DIAGNOSIS — I10 Essential (primary) hypertension: Secondary | ICD-10-CM | POA: Diagnosis not present

## 2014-08-11 DIAGNOSIS — H31091 Other chorioretinal scars, right eye: Secondary | ICD-10-CM | POA: Diagnosis not present

## 2014-08-12 DIAGNOSIS — I10 Essential (primary) hypertension: Secondary | ICD-10-CM | POA: Diagnosis not present

## 2014-08-13 DIAGNOSIS — I10 Essential (primary) hypertension: Secondary | ICD-10-CM | POA: Diagnosis not present

## 2014-08-14 DIAGNOSIS — I10 Essential (primary) hypertension: Secondary | ICD-10-CM | POA: Diagnosis not present

## 2014-08-15 DIAGNOSIS — I10 Essential (primary) hypertension: Secondary | ICD-10-CM | POA: Diagnosis not present

## 2014-08-16 DIAGNOSIS — I10 Essential (primary) hypertension: Secondary | ICD-10-CM | POA: Diagnosis not present

## 2014-08-17 DIAGNOSIS — I10 Essential (primary) hypertension: Secondary | ICD-10-CM | POA: Diagnosis not present

## 2014-08-18 DIAGNOSIS — I10 Essential (primary) hypertension: Secondary | ICD-10-CM | POA: Diagnosis not present

## 2014-08-19 DIAGNOSIS — I10 Essential (primary) hypertension: Secondary | ICD-10-CM | POA: Diagnosis not present

## 2014-08-20 DIAGNOSIS — I10 Essential (primary) hypertension: Secondary | ICD-10-CM | POA: Diagnosis not present

## 2014-08-21 DIAGNOSIS — I10 Essential (primary) hypertension: Secondary | ICD-10-CM | POA: Diagnosis not present

## 2014-08-22 DIAGNOSIS — I10 Essential (primary) hypertension: Secondary | ICD-10-CM | POA: Diagnosis not present

## 2014-08-23 ENCOUNTER — Ambulatory Visit (INDEPENDENT_AMBULATORY_CARE_PROVIDER_SITE_OTHER): Payer: Medicare Other | Admitting: Student

## 2014-08-23 ENCOUNTER — Encounter: Payer: Self-pay | Admitting: Student

## 2014-08-23 VITALS — BP 167/99 | HR 79 | Temp 99.2°F | Ht 70.0 in | Wt 187.0 lb

## 2014-08-23 DIAGNOSIS — G894 Chronic pain syndrome: Secondary | ICD-10-CM | POA: Diagnosis not present

## 2014-08-23 DIAGNOSIS — M19019 Primary osteoarthritis, unspecified shoulder: Secondary | ICD-10-CM | POA: Diagnosis not present

## 2014-08-23 DIAGNOSIS — I10 Essential (primary) hypertension: Secondary | ICD-10-CM | POA: Diagnosis not present

## 2014-08-23 DIAGNOSIS — Z7189 Other specified counseling: Secondary | ICD-10-CM

## 2014-08-23 DIAGNOSIS — G8929 Other chronic pain: Secondary | ICD-10-CM

## 2014-08-23 MED ORDER — OXYCODONE-ACETAMINOPHEN 10-325 MG PO TABS: 1.0000 | ORAL_TABLET | Freq: Two times a day (BID) | ORAL | Status: DC

## 2014-08-23 NOTE — Patient Instructions (Signed)
Return in one month Please try to decrease the percocet to twice daily with addition of over the counter pain medication If you feel lightheaded or dizzy please call the office or go to the emergency room

## 2014-08-23 NOTE — Progress Notes (Signed)
   Subjective:    Patient ID: Joel Donaldson, male    DOB: March 02, 1940, 74 y.o.   MRN: [id number]   CC: Refill Percocet  HPI  74 y/o with PMH of HTN and DJD in right shoulder and bilateral knees for refill percocet  Chronic pain - Reports using 2-3 percocets daily as needed for pain and that his pain has been stable. Denies dizziness or falls   Review of Systems   See HPI for ROS.   Past Medical History  Diagnosis Date  . Arthritis     hands, back, knees  . Anxiety   . Depression   . GERD (gastroesophageal reflux disease)   . Glaucoma   . Hypertension   . Prostate cancer   . Heart murmur asymptomatic  . Short of breath on exertion   . Asthma   . Nocturia   . Hx of radioisotope therapy 07/26/11    Radiosactive Prostate Seed Implant/ I-125 Seeds  . Chest pain at rest 12/19/2011  . Rheumatoid arthritis 10/10/2012     History   Social History  . Marital Status: Legally Separated    Spouse Name: Deneise Lever  . Number of Children: 1  . Years of Education: 11   Occupational History  . Retired    Social History Main Topics  . Smoking status: Former Smoker -- 2.00 packs/day for 50 years    Types: Cigarettes    Quit date: 10/13/2004  . Smokeless tobacco: Never Used     Comment: Still not smoking.   . Alcohol Use: 8.4 oz/week    14 Shots of liquor per week     Comment: occassional  . Drug Use: No  . Sexual Activity: Not on file   Other Topics Concern  . Not on file   Social History Narrative   Lives 1-story home with granddaughter and great-granddaughter.    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       Health Care POA:    Emergency Contact: wife, Haik Mahoney (c) [phone]   End of Life Plan:    Who lives with you: wife and granddaughter   Any pets: 2 dogs, Precious and Spike   Diet: Pt has a varied diet of protein, starch, vegetables.  Pt does not have any kind of modified diet, eats what he likes.   Exercise: Pt does not have regular exercise routine.   Seatbelts: Pt reports wearing seatbelt when in vehicles.    Sun Exposure/Protection:    Hobbies: fishing, hunting           Objective:  BP 167/99 mmHg  Pulse 79  Temp(Src) 99.2 F (37.3 C) (Oral)  Ht 5\' 10"  (1.778 m)  Wt 187 lb (84.823 kg)  BMI 26.83 kg/m2 Vitals and nursing note reviewed  General: NAD Cardiac: RRR, normal heart sounds, no murmurs. Respiratory: CTAB, normal effort Abdomen: soft, nontender, nondistended, no hepatic or splenomegaly. Bowel sounds present Extremities: no edema or cyanosis. WWP. Tenderness to palpation over right shoulder and pain with [assive movement of bilateral knees Skin: warm and dry, no rashes noted Neuro: alert and oriented, no focal deficits   Assessment & Plan:  See Problem List      Vannie Hochstetler A. Lincoln Brigham MD, [facility] Resident PGY-1 Pager [phone]

## 2014-08-24 DIAGNOSIS — I10 Essential (primary) hypertension: Secondary | ICD-10-CM | POA: Diagnosis not present

## 2014-08-24 LAB — DRUG SCR UR, PAIN MGMT, REFLEX CONF
Amphetamine Screen, Ur: NEGATIVE
Barbiturate Quant, Ur: NEGATIVE
Benzodiazepines.: NEGATIVE
Cocaine Metabolites: NEGATIVE
Creatinine,U: 114.94 mg/dL
MARIJUANA METABOLITE: NEGATIVE
Methadone: NEGATIVE
Opiates: NEGATIVE
PROPOXYPHENE: NEGATIVE
Phencyclidine (PCP): NEGATIVE

## 2014-08-25 DIAGNOSIS — I10 Essential (primary) hypertension: Secondary | ICD-10-CM | POA: Diagnosis not present

## 2014-08-26 DIAGNOSIS — I10 Essential (primary) hypertension: Secondary | ICD-10-CM | POA: Diagnosis not present

## 2014-08-27 DIAGNOSIS — I10 Essential (primary) hypertension: Secondary | ICD-10-CM | POA: Diagnosis not present

## 2014-08-28 DIAGNOSIS — I10 Essential (primary) hypertension: Secondary | ICD-10-CM | POA: Diagnosis not present

## 2014-08-29 DIAGNOSIS — I10 Essential (primary) hypertension: Secondary | ICD-10-CM | POA: Diagnosis not present

## 2014-08-30 DIAGNOSIS — I10 Essential (primary) hypertension: Secondary | ICD-10-CM | POA: Diagnosis not present

## 2014-08-31 DIAGNOSIS — I10 Essential (primary) hypertension: Secondary | ICD-10-CM | POA: Diagnosis not present

## 2014-09-01 DIAGNOSIS — I10 Essential (primary) hypertension: Secondary | ICD-10-CM | POA: Diagnosis not present

## 2014-09-02 DIAGNOSIS — I10 Essential (primary) hypertension: Secondary | ICD-10-CM | POA: Diagnosis not present

## 2014-09-03 DIAGNOSIS — I10 Essential (primary) hypertension: Secondary | ICD-10-CM | POA: Diagnosis not present

## 2014-09-04 DIAGNOSIS — I10 Essential (primary) hypertension: Secondary | ICD-10-CM | POA: Diagnosis not present

## 2014-09-05 DIAGNOSIS — I10 Essential (primary) hypertension: Secondary | ICD-10-CM | POA: Diagnosis not present

## 2014-09-06 DIAGNOSIS — I10 Essential (primary) hypertension: Secondary | ICD-10-CM | POA: Diagnosis not present

## 2014-09-07 DIAGNOSIS — I10 Essential (primary) hypertension: Secondary | ICD-10-CM | POA: Diagnosis not present

## 2014-09-08 DIAGNOSIS — I10 Essential (primary) hypertension: Secondary | ICD-10-CM | POA: Diagnosis not present

## 2014-09-09 DIAGNOSIS — I10 Essential (primary) hypertension: Secondary | ICD-10-CM | POA: Diagnosis not present

## 2014-09-10 DIAGNOSIS — I10 Essential (primary) hypertension: Secondary | ICD-10-CM | POA: Diagnosis not present

## 2014-09-11 DIAGNOSIS — I10 Essential (primary) hypertension: Secondary | ICD-10-CM | POA: Diagnosis not present

## 2014-09-12 DIAGNOSIS — I10 Essential (primary) hypertension: Secondary | ICD-10-CM | POA: Diagnosis not present

## 2014-09-13 DIAGNOSIS — I10 Essential (primary) hypertension: Secondary | ICD-10-CM | POA: Diagnosis not present

## 2014-09-14 DIAGNOSIS — I10 Essential (primary) hypertension: Secondary | ICD-10-CM | POA: Diagnosis not present

## 2014-09-15 DIAGNOSIS — I10 Essential (primary) hypertension: Secondary | ICD-10-CM | POA: Diagnosis not present

## 2014-09-16 DIAGNOSIS — I10 Essential (primary) hypertension: Secondary | ICD-10-CM | POA: Diagnosis not present

## 2014-09-17 DIAGNOSIS — I10 Essential (primary) hypertension: Secondary | ICD-10-CM | POA: Diagnosis not present

## 2014-09-18 DIAGNOSIS — I10 Essential (primary) hypertension: Secondary | ICD-10-CM | POA: Diagnosis not present

## 2014-09-19 DIAGNOSIS — I10 Essential (primary) hypertension: Secondary | ICD-10-CM | POA: Diagnosis not present

## 2014-09-20 DIAGNOSIS — I10 Essential (primary) hypertension: Secondary | ICD-10-CM | POA: Diagnosis not present

## 2014-09-21 DIAGNOSIS — I10 Essential (primary) hypertension: Secondary | ICD-10-CM | POA: Diagnosis not present

## 2014-09-22 DIAGNOSIS — I10 Essential (primary) hypertension: Secondary | ICD-10-CM | POA: Diagnosis not present

## 2014-09-23 DIAGNOSIS — I10 Essential (primary) hypertension: Secondary | ICD-10-CM | POA: Diagnosis not present

## 2014-09-24 DIAGNOSIS — I10 Essential (primary) hypertension: Secondary | ICD-10-CM | POA: Diagnosis not present

## 2014-09-25 DIAGNOSIS — I10 Essential (primary) hypertension: Secondary | ICD-10-CM | POA: Diagnosis not present

## 2014-09-26 DIAGNOSIS — I10 Essential (primary) hypertension: Secondary | ICD-10-CM | POA: Diagnosis not present

## 2014-09-27 DIAGNOSIS — I10 Essential (primary) hypertension: Secondary | ICD-10-CM | POA: Diagnosis not present

## 2014-09-28 DIAGNOSIS — I10 Essential (primary) hypertension: Secondary | ICD-10-CM | POA: Diagnosis not present

## 2014-09-29 DIAGNOSIS — I10 Essential (primary) hypertension: Secondary | ICD-10-CM | POA: Diagnosis not present

## 2014-09-30 DIAGNOSIS — I10 Essential (primary) hypertension: Secondary | ICD-10-CM | POA: Diagnosis not present

## 2014-10-01 DIAGNOSIS — I10 Essential (primary) hypertension: Secondary | ICD-10-CM | POA: Diagnosis not present

## 2014-10-02 DIAGNOSIS — I10 Essential (primary) hypertension: Secondary | ICD-10-CM | POA: Diagnosis not present

## 2014-10-03 DIAGNOSIS — I10 Essential (primary) hypertension: Secondary | ICD-10-CM | POA: Diagnosis not present

## 2014-10-04 DIAGNOSIS — I10 Essential (primary) hypertension: Secondary | ICD-10-CM | POA: Diagnosis not present

## 2014-10-05 DIAGNOSIS — I10 Essential (primary) hypertension: Secondary | ICD-10-CM | POA: Diagnosis not present

## 2014-10-06 DIAGNOSIS — I10 Essential (primary) hypertension: Secondary | ICD-10-CM | POA: Diagnosis not present

## 2014-10-07 DIAGNOSIS — I10 Essential (primary) hypertension: Secondary | ICD-10-CM | POA: Diagnosis not present

## 2014-10-08 DIAGNOSIS — I10 Essential (primary) hypertension: Secondary | ICD-10-CM | POA: Diagnosis not present

## 2014-10-09 DIAGNOSIS — I10 Essential (primary) hypertension: Secondary | ICD-10-CM | POA: Diagnosis not present

## 2014-10-10 DIAGNOSIS — I10 Essential (primary) hypertension: Secondary | ICD-10-CM | POA: Diagnosis not present

## 2014-10-11 DIAGNOSIS — I10 Essential (primary) hypertension: Secondary | ICD-10-CM | POA: Diagnosis not present

## 2014-10-12 DIAGNOSIS — I10 Essential (primary) hypertension: Secondary | ICD-10-CM | POA: Diagnosis not present

## 2014-10-13 DIAGNOSIS — I10 Essential (primary) hypertension: Secondary | ICD-10-CM | POA: Diagnosis not present

## 2014-10-14 DIAGNOSIS — I10 Essential (primary) hypertension: Secondary | ICD-10-CM | POA: Diagnosis not present

## 2014-10-15 DIAGNOSIS — I10 Essential (primary) hypertension: Secondary | ICD-10-CM | POA: Diagnosis not present

## 2014-10-16 DIAGNOSIS — I10 Essential (primary) hypertension: Secondary | ICD-10-CM | POA: Diagnosis not present

## 2014-10-17 DIAGNOSIS — I10 Essential (primary) hypertension: Secondary | ICD-10-CM | POA: Diagnosis not present

## 2014-10-18 DIAGNOSIS — I10 Essential (primary) hypertension: Secondary | ICD-10-CM | POA: Diagnosis not present

## 2014-10-19 DIAGNOSIS — I10 Essential (primary) hypertension: Secondary | ICD-10-CM | POA: Diagnosis not present

## 2014-10-20 DIAGNOSIS — I10 Essential (primary) hypertension: Secondary | ICD-10-CM | POA: Diagnosis not present

## 2014-10-21 DIAGNOSIS — I10 Essential (primary) hypertension: Secondary | ICD-10-CM | POA: Diagnosis not present

## 2014-10-22 DIAGNOSIS — I10 Essential (primary) hypertension: Secondary | ICD-10-CM | POA: Diagnosis not present

## 2014-10-23 DIAGNOSIS — I10 Essential (primary) hypertension: Secondary | ICD-10-CM | POA: Diagnosis not present

## 2014-10-24 DIAGNOSIS — I10 Essential (primary) hypertension: Secondary | ICD-10-CM | POA: Diagnosis not present

## 2014-10-25 DIAGNOSIS — I10 Essential (primary) hypertension: Secondary | ICD-10-CM | POA: Diagnosis not present

## 2014-10-26 DIAGNOSIS — I10 Essential (primary) hypertension: Secondary | ICD-10-CM | POA: Diagnosis not present

## 2014-10-27 DIAGNOSIS — I10 Essential (primary) hypertension: Secondary | ICD-10-CM | POA: Diagnosis not present

## 2014-10-28 DIAGNOSIS — I10 Essential (primary) hypertension: Secondary | ICD-10-CM | POA: Diagnosis not present

## 2014-10-29 DIAGNOSIS — I10 Essential (primary) hypertension: Secondary | ICD-10-CM | POA: Diagnosis not present

## 2014-10-30 DIAGNOSIS — I10 Essential (primary) hypertension: Secondary | ICD-10-CM | POA: Diagnosis not present

## 2014-10-31 DIAGNOSIS — I10 Essential (primary) hypertension: Secondary | ICD-10-CM | POA: Diagnosis not present

## 2014-11-01 ENCOUNTER — Other Ambulatory Visit: Payer: Self-pay | Admitting: *Deleted

## 2014-11-01 DIAGNOSIS — I10 Essential (primary) hypertension: Secondary | ICD-10-CM | POA: Diagnosis not present

## 2014-11-01 MED ORDER — LISINOPRIL-HYDROCHLOROTHIAZIDE 20-12.5 MG PO TABS: 1.0000 | ORAL_TABLET | Freq: Every day | ORAL | Status: DC

## 2014-11-01 NOTE — Telephone Encounter (Signed)
Rx filled.  Algis Greenhouse. Jerline Pain, [facility] Resident PGY-2 11/01/2014 5:12 PM

## 2014-11-02 DIAGNOSIS — I10 Essential (primary) hypertension: Secondary | ICD-10-CM | POA: Diagnosis not present

## 2014-11-03 ENCOUNTER — Encounter: Payer: Self-pay | Admitting: Family Medicine

## 2014-11-03 ENCOUNTER — Ambulatory Visit (INDEPENDENT_AMBULATORY_CARE_PROVIDER_SITE_OTHER): Payer: Medicare Other | Admitting: Family Medicine

## 2014-11-03 VITALS — BP 134/82 | HR 124 | Temp 98.3°F | Wt 185.0 lb

## 2014-11-03 DIAGNOSIS — M170 Bilateral primary osteoarthritis of knee: Secondary | ICD-10-CM | POA: Diagnosis not present

## 2014-11-03 DIAGNOSIS — M19019 Primary osteoarthritis, unspecified shoulder: Secondary | ICD-10-CM | POA: Diagnosis not present

## 2014-11-03 DIAGNOSIS — I10 Essential (primary) hypertension: Secondary | ICD-10-CM | POA: Diagnosis not present

## 2014-11-03 MED ORDER — OXYCODONE-ACETAMINOPHEN 10-325 MG PO TABS: 1.0000 | ORAL_TABLET | Freq: Two times a day (BID) | ORAL | Status: DC

## 2014-11-03 MED ORDER — PROMETHAZINE HCL 25 MG PO TABS: 25.0000 mg | ORAL_TABLET | Freq: Three times a day (TID) | ORAL | Status: DC | PRN

## 2014-11-03 MED ORDER — KETOROLAC TROMETHAMINE 30 MG/ML IJ SOLN
15.0000 mg | Freq: Once | INTRAMUSCULAR | Status: AC
  Administered 2014-11-03: 15 mg via INTRAMUSCULAR

## 2014-11-03 NOTE — Patient Instructions (Signed)
Thank you for coming to see [place] in clinic today! We will give you a shot here in clinic to help with your pain. We will also give you a tablet to take home with you; this may make you sleepy so please do not drive after taking it! I have also given you a prescription for your pain medicine. Please follow up with Dr. Lincoln Brigham for future refills. If you develop any neurological signs (numbness, weakness, facial droop, slurred speech), please go to the ED immediately.  Thanks again! Dr. Gerlean Ren

## 2014-11-03 NOTE — Progress Notes (Signed)
Subjective:     Patient ID: Joel Donaldson, male   DOB: 30-Aug-1940, 74 y.o.   MRN: [id number]  HPI Joel Donaldson is a 74yo male presenting today for headache. - History of migraines noted. Reports consistent with prior migraines. - Pain is bilateral and posterior - Pain is constant and has been present three days - Denies neurological changes, including weakness, numbness, facial droop, slurred speech, blurred vision - Denies aura, photophobia, phonophobia - Reports occasional dizziness - Last migraine several years ago - Reports he was on beta blocker for years, which helped. Now on Lisinopril-HCTZ - Has been using Tylenol with some, but minimal, relief  - Also reports being out of pain medication for two weeks. Requests refill.  - Reports BP has been stable on current medication.  Review of Systems Per HPI    Objective:   Physical Exam  Constitutional: He appears well-developed and well-nourished. No distress.  HENT:  Head: Normocephalic and atraumatic.  Eyes: EOM are normal. Pupils are equal, round, and reactive to light. Right eye exhibits no discharge. Left eye exhibits no discharge.  Neck: Normal range of motion. Neck supple.  No cervical spine tenderness noted. Normal muscular tightness or soreness palpated.  Cardiovascular: Normal rate and regular rhythm.  Exam reveals no gallop and no friction rub.   No murmur heard. Pulmonary/Chest: Effort normal. No respiratory distress. He has no wheezes. He has no rales.  Neurological: He is alert. No cranial nerve deficit.  Muscle strength 5/5 in upper and lower extremities bilaterally, no decreased sensation noted, no facial asymmetry or slurred speech noted  Psychiatric: He has a normal mood and affect. His behavior is normal.       Assessment and Plan:     MIGRAINE, UNSPEC., W/O INTRACTABLE MIGRAINE - Shot of Toradol 15mg  given in office. Tablet of Phenergan 25mg  given for Joel Donaldson to take home. Discussed phenergan can result  in drowsiness and should not be taken prior to arriving at home or before driving. - Discussed red flags for which he should present to ED, including neurological symptoms - Continue Tylenol PRN  HYPERTENSION, BENIGN SYSTEMIC - Initially 174/88. Improved to 134/82 with rest. - Continue Lisinopril-HCTZ.  - Continue to monitor at future visits  Osteoarthritis of both knees - PCP without appointment until 10/25. Scheduled appointment with PCP. - Enough Percocet given to get to PCP's appointment - Discussed that for future pain medication refills, he should schedule appointment with PCP

## 2014-11-03 NOTE — Assessment & Plan Note (Signed)
-   PCP without appointment until 10/25. Scheduled appointment with PCP. - Enough Percocet given to get to PCP's appointment - Discussed that for future pain medication refills, he should schedule appointment with PCP

## 2014-11-03 NOTE — Assessment & Plan Note (Signed)
-   Initially 174/88. Improved to 134/82 with rest. - Continue Lisinopril-HCTZ.  - Continue to monitor at future visits

## 2014-11-03 NOTE — Assessment & Plan Note (Signed)
-   Shot of Toradol 15mg  given in office. Tablet of Phenergan 25mg  given for Mr. [place] to take home. Discussed phenergan can result in drowsiness and should not be taken prior to arriving at home or before driving. - Discussed red flags for which he should present to ED, including neurological symptoms - Continue Tylenol PRN

## 2014-11-04 DIAGNOSIS — I10 Essential (primary) hypertension: Secondary | ICD-10-CM | POA: Diagnosis not present

## 2014-11-05 DIAGNOSIS — I10 Essential (primary) hypertension: Secondary | ICD-10-CM | POA: Diagnosis not present

## 2014-11-06 DIAGNOSIS — I10 Essential (primary) hypertension: Secondary | ICD-10-CM | POA: Diagnosis not present

## 2014-11-07 DIAGNOSIS — I10 Essential (primary) hypertension: Secondary | ICD-10-CM | POA: Diagnosis not present

## 2014-11-08 DIAGNOSIS — I10 Essential (primary) hypertension: Secondary | ICD-10-CM | POA: Diagnosis not present

## 2014-11-09 DIAGNOSIS — I10 Essential (primary) hypertension: Secondary | ICD-10-CM | POA: Diagnosis not present

## 2014-11-10 DIAGNOSIS — I10 Essential (primary) hypertension: Secondary | ICD-10-CM | POA: Diagnosis not present

## 2014-11-11 DIAGNOSIS — I10 Essential (primary) hypertension: Secondary | ICD-10-CM | POA: Diagnosis not present

## 2014-11-12 DIAGNOSIS — I10 Essential (primary) hypertension: Secondary | ICD-10-CM | POA: Diagnosis not present

## 2014-11-13 DIAGNOSIS — I10 Essential (primary) hypertension: Secondary | ICD-10-CM | POA: Diagnosis not present

## 2014-11-14 ENCOUNTER — Emergency Department (HOSPITAL_COMMUNITY)
Admission: EM | Admit: 2014-11-14 | Discharge: 2014-11-14 | Disposition: A | Discharge status: Home / Self Care | Payer: Medicare Other | Attending: Emergency Medicine | Admitting: Emergency Medicine

## 2014-11-14 ENCOUNTER — Encounter (HOSPITAL_COMMUNITY): Payer: Self-pay | Admitting: *Deleted

## 2014-11-14 DIAGNOSIS — R42 Dizziness and giddiness: Secondary | ICD-10-CM | POA: Diagnosis present

## 2014-11-14 DIAGNOSIS — I10 Essential (primary) hypertension: Secondary | ICD-10-CM | POA: Insufficient documentation

## 2014-11-14 DIAGNOSIS — Z8669 Personal history of other diseases of the nervous system and sense organs: Secondary | ICD-10-CM | POA: Diagnosis not present

## 2014-11-14 DIAGNOSIS — R55 Syncope and collapse: Secondary | ICD-10-CM | POA: Insufficient documentation

## 2014-11-14 DIAGNOSIS — J45909 Unspecified asthma, uncomplicated: Secondary | ICD-10-CM | POA: Insufficient documentation

## 2014-11-14 DIAGNOSIS — F419 Anxiety disorder, unspecified: Secondary | ICD-10-CM | POA: Diagnosis not present

## 2014-11-14 DIAGNOSIS — M069 Rheumatoid arthritis, unspecified: Secondary | ICD-10-CM | POA: Diagnosis not present

## 2014-11-14 DIAGNOSIS — Z87891 Personal history of nicotine dependence: Secondary | ICD-10-CM | POA: Insufficient documentation

## 2014-11-14 DIAGNOSIS — K219 Gastro-esophageal reflux disease without esophagitis: Secondary | ICD-10-CM | POA: Insufficient documentation

## 2014-11-14 DIAGNOSIS — Z79899 Other long term (current) drug therapy: Secondary | ICD-10-CM | POA: Insufficient documentation

## 2014-11-14 DIAGNOSIS — F329 Major depressive disorder, single episode, unspecified: Secondary | ICD-10-CM | POA: Insufficient documentation

## 2014-11-14 DIAGNOSIS — Z8546 Personal history of malignant neoplasm of prostate: Secondary | ICD-10-CM | POA: Insufficient documentation

## 2014-11-14 DIAGNOSIS — R011 Cardiac murmur, unspecified: Secondary | ICD-10-CM | POA: Diagnosis not present

## 2014-11-14 LAB — BASIC METABOLIC PANEL
Anion gap: 9 (ref 5–15)
BUN: 11 mg/dL (ref 6–20)
CO2: 30 mmol/L (ref 22–32)
Calcium: 8.9 mg/dL (ref 8.9–10.3)
Chloride: 103 mmol/L (ref 101–111)
Creatinine, Ser: 1 mg/dL (ref 0.61–1.24)
GFR calc Af Amer: >60 mL/min (ref >60)
GFR calc non Af Amer: >60 mL/min (ref >60)
Glucose, Bld: 110 mg/dL — ABNORMAL HIGH (ref 65–99)
Potassium: 3.8 mmol/L (ref 3.5–5.1)
Sodium: 142 mmol/L (ref 135–145)

## 2014-11-14 LAB — CBC
HCT: 42.2 % (ref 39.0–52.0)
Hemoglobin: 14 g/dL (ref 13.0–17.0)
MCH: 28.6 pg (ref 26.0–34.0)
MCHC: 33.2 g/dL (ref 30.0–36.0)
MCV: 86.3 fL (ref 78.0–100.0)
Platelets: 178 10*3/uL (ref 150–400)
RBC: 4.89 MIL/uL (ref 4.22–5.81)
RDW: 14.3 % (ref 11.5–15.5)
WBC: 2.9 10*3/uL — ABNORMAL LOW (ref 4.0–10.5)

## 2014-11-14 LAB — URINALYSIS, ROUTINE W REFLEX MICROSCOPIC
Bilirubin Urine: NEGATIVE
Glucose, UA: NEGATIVE mg/dL
Hgb urine dipstick: NEGATIVE
Ketones, ur: NEGATIVE mg/dL
Leukocytes, UA: NEGATIVE
Nitrite: NEGATIVE
Protein, ur: NEGATIVE mg/dL
Specific Gravity, Urine: 1.018 (ref 1.005–1.030)
Urobilinogen, UA: 1 mg/dL (ref 0.0–1.0)
pH: 7 (ref 5.0–8.0)

## 2014-11-14 LAB — CBG MONITORING, ED: Glucose-Capillary: 94 mg/dL (ref 65–99)

## 2014-11-14 NOTE — Discharge Instructions (Signed)
Syncope °Syncope is a medical term for fainting or passing out. This means you lose consciousness and drop to the ground. People are generally unconscious for less than 5 minutes. You may have some muscle twitches for up to 15 seconds before waking up and returning to normal. Syncope occurs more often in older adults, but it can happen to anyone. While most causes of syncope are not dangerous, syncope can be a sign of a serious medical problem. It is important to seek medical care.  °CAUSES  °Syncope is caused by a sudden drop in blood flow to the brain. The specific cause is often not determined. Factors that can bring on syncope include: °· Taking medicines that lower blood pressure. °· Sudden changes in posture, such as standing up quickly. °· Taking more medicine than prescribed. °· Standing in one place for too long. °· Seizure disorders. °· Dehydration and excessive exposure to heat. °· Low blood sugar (hypoglycemia). °· Straining to have a bowel movement. °· Heart disease, irregular heartbeat, or other circulatory problems. °· Fear, emotional distress, seeing blood, or severe pain. °SYMPTOMS  °Right before fainting, you may: °· Feel dizzy or light-headed. °· Feel nauseous. °· See all white or all black in your field of vision. °· Have cold, clammy skin. °DIAGNOSIS  °Your health care provider will ask about your symptoms, perform a physical exam, and perform an electrocardiogram (ECG) to record the electrical activity of your heart. Your health care provider may also perform other heart or blood tests to determine the cause of your syncope which may include: °· Transthoracic echocardiogram (TTE). During echocardiography, sound waves are used to evaluate how blood flows through your heart. °· Transesophageal echocardiogram (TEE). °· Cardiac monitoring. This allows your health care provider to monitor your heart rate and rhythm in real time. °· Holter monitor. This is a portable device that records your  heartbeat and can help diagnose heart arrhythmias. It allows your health care provider to track your heart activity for several days, if needed. °· Stress tests by exercise or by giving medicine that makes the heart beat faster. °TREATMENT  °In most cases, no treatment is needed. Depending on the cause of your syncope, your health care provider may recommend changing or stopping some of your medicines. °HOME CARE INSTRUCTIONS °· Have someone stay with you until you feel stable. °· Do not drive, use machinery, or play sports until your health care provider says it is okay. °· Keep all follow-up appointments as directed by your health care provider. °· Lie down right away if you start feeling like you might faint. Breathe deeply and steadily. Wait until all the symptoms have passed. °· Drink enough fluids to keep your urine clear or pale yellow. °· If you are taking blood pressure or heart medicine, get up slowly and take several minutes to sit and then stand. This can reduce dizziness. °SEEK IMMEDIATE MEDICAL CARE IF:  °· You have a severe headache. °· You have unusual pain in the chest, abdomen, or back. °· You are bleeding from your mouth or rectum, or you have black or tarry stool. °· You have an irregular or very fast heartbeat. °· You have pain with breathing. °· You have repeated fainting or seizure-like jerking during an episode. °· You faint when sitting or lying down. °· You have confusion. °· You have trouble walking. °· You have severe weakness. °· You have vision problems. °If you fainted, call your local emergency services (911 in [place] Do not drive   yourself to the hospital.  °MAKE SURE YOU: °· Understand these instructions. °· Will watch your condition. °· Will get help right away if you are not doing well or get worse. °Document Released: 01/29/2005 Document Revised: 02/03/2013 Document Reviewed: 03/30/2011 °ExitCare® Patient Information ©2015 [employer]. This information is not intended to replace  advice given to you by your health care provider. Make sure you discuss any questions you have with your health care provider. ° °

## 2014-11-14 NOTE — ED Notes (Signed)
Pt reports he was dizzy last night, family reports that "he blanked out for a few minutes" but not pass out. Pt reports continued dizziness, started having headache this morning. Pain 5/10. Denies n/v. Denies blurry vision. Denies hx of seizures

## 2014-11-15 DIAGNOSIS — I10 Essential (primary) hypertension: Secondary | ICD-10-CM | POA: Diagnosis not present

## 2014-11-16 DIAGNOSIS — I10 Essential (primary) hypertension: Secondary | ICD-10-CM | POA: Diagnosis not present

## 2014-11-17 DIAGNOSIS — I10 Essential (primary) hypertension: Secondary | ICD-10-CM | POA: Diagnosis not present

## 2014-11-18 DIAGNOSIS — I10 Essential (primary) hypertension: Secondary | ICD-10-CM | POA: Diagnosis not present

## 2014-11-19 DIAGNOSIS — I10 Essential (primary) hypertension: Secondary | ICD-10-CM | POA: Diagnosis not present

## 2014-11-20 DIAGNOSIS — I10 Essential (primary) hypertension: Secondary | ICD-10-CM | POA: Diagnosis not present

## 2014-11-21 DIAGNOSIS — I10 Essential (primary) hypertension: Secondary | ICD-10-CM | POA: Diagnosis not present

## 2014-11-21 NOTE — ED Provider Notes (Signed)
CSN: [id number]     Arrival date & time 11/14/14  1440 History   First MD Initiated Contact with Patient 11/14/14 1504     Chief Complaint  Patient presents with  . Dizziness     (Consider location/radiation/quality/duration/timing/severity/associated sxs/prior Treatment) HPI   74 year old male presenting with family for evaluation of possible syncope.  Family reports that he "blanked out" for up to a couple minutes. They describe that he seemed a week but was not responding to them speaking to him. He did not seem to lose consciousness. Did not lose tone. Afterwards he mildly confused. He is currently complaining of some mild dizziness and a mild headache. Patient does recall earlier events. No history of seizure. No recent syncope. No falls. No fevers or chills. Past Medical History  Diagnosis Date  . Arthritis     hands, back, knees  . Anxiety   . Depression   . GERD (gastroesophageal reflux disease)   . Glaucoma   . Hypertension   . Prostate cancer ([place])   . Heart murmur asymptomatic  . Short of breath on exertion   . Asthma   . Nocturia   . Hx of radioisotope therapy 07/26/11    Radiosactive Prostate Seed Implant/ I-125 Seeds  . Chest pain at rest 12/19/2011  . Rheumatoid arthritis ([place]) 10/10/2012   Past Surgical History  Procedure Laterality Date  . Shoulder surgery  YRS AGO    LEFT  . Lumbar fusion  1995  . Radioactive seed implant  09/14/2011    Procedure: RADIOACTIVE SEED IMPLANT;  Surgeon: Hanley Ben, MD;  Location: [facility];  Service: Urology;  Laterality: N/A;  Total number of seeds -    . Cystoscopy  09/14/2011    Procedure: CYSTOSCOPY FLEXIBLE;  Surgeon: Hanley Ben, MD;  Location: [facility];  Service: Urology;  Laterality: N/A;  No seeds seen in the bladder   Family History  Problem Relation Age of Onset  . Diabetes Mother    Social History  Substance Use Topics  . Smoking status: Former Smoker -- 2.00 packs/day  for 50 years    Types: Cigarettes    Quit date: 10/13/2004  . Smokeless tobacco: Never Used     Comment: Still not smoking.   . Alcohol Use: 8.4 oz/week    14 Shots of liquor per week     Comment: occassional    Review of Systems  All systems reviewed and negative, other than as noted in HPI.   Allergies  Tramadol and Pollen extract  Home Medications   Prior to Admission medications   Medication Sig Start Date End Date Taking? Authorizing Provider  albuterol (PROVENTIL HFA;VENTOLIN HFA) 108 (90 BASE) MCG/ACT inhaler Inhale 2 puffs into the lungs every 6 (six) hours as needed for wheezing or shortness of breath (wheezing).   Yes Historical Provider, MD  calcium carbonate (TUMS - DOSED IN MG ELEMENTAL CALCIUM) 500 MG chewable tablet Chew 1 tablet by mouth daily as needed for indigestion or heartburn.   Yes Historical Provider, MD  lisinopril-hydrochlorothiazide (PRINZIDE,ZESTORETIC) 20-12.5 MG per tablet Take 1 tablet by mouth daily. 11/01/14  Yes Vivi Barrack, MD  nitroGLYCERIN (NITROSTAT) 0.4 MG SL tablet Place 0.4 mg under the tongue every 5 (five) minutes as needed for chest pain (chest pain).  12/20/11  Yes Sharon [address], MD  omeprazole (PRILOSEC) 40 MG capsule Take 1 capsule (40 mg total) by mouth every morning. 12/21/13  Yes Nila Nephew, MD  oxyCODONE-acetaminophen [facility])  10-325 MG per tablet Take 1 tablet by mouth 2 (two) times daily. Patient taking differently: Take 1 tablet by mouth 2 (two) times daily as needed for pain.  11/03/14  Yes [place] Rum[place], DO  acetaminophen (TYLENOL) 325 MG tablet Take 2 tablets (650 mg total) by mouth every 6 (six) hours as needed. Patient not taking: Reported on 11/14/2014 07/13/14   Veatrice Bourbon, MD  ergocalciferol (VITAMIN D2) 50000 UNITS capsule Take 1 capsule (50,000 Units total) by mouth once a week. Patient not taking: Reported on 11/14/2014 07/07/13   Waldemar Dickens, MD  HYDROcodone-acetaminophen (NORCO/VICODIN) 5-325 MG per  tablet Take 1 tablet by mouth every 6 (six) hours as needed. Patient not taking: Reported on 07/13/2014 06/17/14   Delice Bison Ward, DO  ibuprofen (MOTRIN IB) 200 MG tablet Take 3 tablets (600 mg total) by mouth every 6 (six) hours. Patient not taking: Reported on 11/14/2014 04/15/14 04/25/15  Veatrice Bourbon, MD  ondansetron (ZOFRAN ODT) 4 MG disintegrating tablet Take 1 tablet (4 mg total) by mouth every 8 (eight) hours as needed for nausea or vomiting. Patient not taking: Reported on 11/14/2014 06/17/14   Delice Bison Ward, DO  promethazine (PHENERGAN) 25 MG tablet Take 1 tablet (25 mg total) by mouth every 8 (eight) hours as needed for nausea or vomiting. Patient not taking: Reported on 11/14/2014 11/03/14   [place] N Rumley, DO   BP 148/89 mmHg  Pulse 84  Temp(Src) 99 F (37.2 C) (Oral)  Resp 24  SpO2 100% Physical Exam  Constitutional: He appears well-developed and well-nourished. No distress.  HENT:  Head: Normocephalic and atraumatic.  Eyes: Conjunctivae are normal. Right eye exhibits no discharge. Left eye exhibits no discharge.  Neck: Neck supple.  Cardiovascular: Normal rate, regular rhythm and normal heart sounds.  Exam reveals no gallop and no friction rub.   No murmur heard. Pulmonary/Chest: Effort normal and breath sounds normal. No respiratory distress.  Abdominal: Soft. He exhibits no distension. There is no tenderness.  Musculoskeletal: He exhibits no edema or tenderness.  Neurological: He is alert. No cranial nerve deficit. He exhibits normal muscle tone. Coordination normal.  Skin: Skin is warm and dry.  Psychiatric: He has a normal mood and affect. His behavior is normal. Thought content normal.  Nursing note and vitals reviewed.   ED Course  Procedures (including critical care time) Labs Review Labs Reviewed  BASIC METABOLIC PANEL - Abnormal; Notable for the following:    Glucose, Bld 110 (*)    All other components within normal limits  CBC - Abnormal; Notable for the  following:    WBC 2.9 (*)    All other components within normal limits  URINALYSIS, ROUTINE W REFLEX MICROSCOPIC (NOT AT [facility])  CBG MONITORING, ED    Imaging Review No results found. I have personally reviewed and evaluated these images and lab results as part of my medical decision-making.   EKG Interpretation   Date/Time:  Sunday November 14 2014 15:26:04 EDT Ventricular Rate:  85 PR Interval:  162 QRS Duration: 88 QT Interval:  389 QTC Calculation: 463 R Axis:   -20 Text Interpretation:  Sinus rhythm Borderline left axis deviation Baseline  wander Non-specific ST-t changes Confirmed by Wilson Singer  MD, [place] ([id number]) on  11/14/2014 5:22:21 PM      MDM   Final diagnoses:  Syncope, unspecified syncope type    74 year old male with likely syncope.  He appears well. Hemodynamically stable. Relatively unremarkable workup and exam. Low suspicion for  emergent process.It has been determined that no acute conditions requiring further emergency intervention are present at this time. The patient has been advised of the diagnosis and plan. I reviewed any labs and imaging including any potential incidental findings. We have discussed signs and symptoms that warrant return to the ED and they are listed in the discharge instructions.      Virgel Manifold, MD 11/21/14 [phone]

## 2014-11-22 DIAGNOSIS — I10 Essential (primary) hypertension: Secondary | ICD-10-CM | POA: Diagnosis not present

## 2014-11-23 DIAGNOSIS — I10 Essential (primary) hypertension: Secondary | ICD-10-CM | POA: Diagnosis not present

## 2014-11-24 DIAGNOSIS — I10 Essential (primary) hypertension: Secondary | ICD-10-CM | POA: Diagnosis not present

## 2014-11-25 DIAGNOSIS — I10 Essential (primary) hypertension: Secondary | ICD-10-CM | POA: Diagnosis not present

## 2014-11-26 DIAGNOSIS — I10 Essential (primary) hypertension: Secondary | ICD-10-CM | POA: Diagnosis not present

## 2014-11-27 DIAGNOSIS — I10 Essential (primary) hypertension: Secondary | ICD-10-CM | POA: Diagnosis not present

## 2014-11-28 DIAGNOSIS — I10 Essential (primary) hypertension: Secondary | ICD-10-CM | POA: Diagnosis not present

## 2014-11-29 DIAGNOSIS — I10 Essential (primary) hypertension: Secondary | ICD-10-CM | POA: Diagnosis not present

## 2014-11-30 DIAGNOSIS — I10 Essential (primary) hypertension: Secondary | ICD-10-CM | POA: Diagnosis not present

## 2014-12-01 DIAGNOSIS — I10 Essential (primary) hypertension: Secondary | ICD-10-CM | POA: Diagnosis not present

## 2014-12-02 DIAGNOSIS — I10 Essential (primary) hypertension: Secondary | ICD-10-CM | POA: Diagnosis not present

## 2014-12-03 DIAGNOSIS — I10 Essential (primary) hypertension: Secondary | ICD-10-CM | POA: Diagnosis not present

## 2014-12-04 DIAGNOSIS — I10 Essential (primary) hypertension: Secondary | ICD-10-CM | POA: Diagnosis not present

## 2014-12-05 DIAGNOSIS — I10 Essential (primary) hypertension: Secondary | ICD-10-CM | POA: Diagnosis not present

## 2014-12-06 DIAGNOSIS — I10 Essential (primary) hypertension: Secondary | ICD-10-CM | POA: Diagnosis not present

## 2014-12-07 ENCOUNTER — Encounter: Payer: Self-pay | Admitting: Student

## 2014-12-07 ENCOUNTER — Ambulatory Visit (INDEPENDENT_AMBULATORY_CARE_PROVIDER_SITE_OTHER): Payer: Medicare Other | Admitting: Student

## 2014-12-07 VITALS — BP 132/84 | HR 106 | Temp 98.4°F | Ht 70.0 in | Wt 181.1 lb

## 2014-12-07 DIAGNOSIS — M19019 Primary osteoarthritis, unspecified shoulder: Secondary | ICD-10-CM | POA: Diagnosis not present

## 2014-12-07 DIAGNOSIS — Z7189 Other specified counseling: Secondary | ICD-10-CM

## 2014-12-07 DIAGNOSIS — R55 Syncope and collapse: Secondary | ICD-10-CM | POA: Diagnosis not present

## 2014-12-07 DIAGNOSIS — G8929 Other chronic pain: Secondary | ICD-10-CM | POA: Diagnosis not present

## 2014-12-07 DIAGNOSIS — I10 Essential (primary) hypertension: Secondary | ICD-10-CM | POA: Diagnosis not present

## 2014-12-07 MED ORDER — OXYCODONE-ACETAMINOPHEN 10-325 MG PO TABS: 1.0000 | ORAL_TABLET | Freq: Two times a day (BID) | ORAL | Status: DC

## 2014-12-07 NOTE — Assessment & Plan Note (Addendum)
Indication for chronic opioid: DJD Medication and dose: Percocet # pills per month: continue 90  Monthly, continue to discuss wean Last UDS date: 08/2014, last 2 UDS screens negative despite pt stating he uses percocet 2-3 times per day. Will obtain one today. Continue to discuss med compliance. Potentially negative due to narcotic metabolism differences Pain contract signed (Y/N): Y Date narcotic database last reviewed (include red flags): 12/07/2014

## 2014-12-07 NOTE — Patient Instructions (Addendum)
Return in 2 months Obtain Echocardiogram. If you have another episode of passing out, go to the [facility] ED right away Check blood pressures daily and record. Bring this to next visit. If blood pressures <110/70 call the office and DO not take blood pressure pill for that day

## 2014-12-07 NOTE — Progress Notes (Signed)
Subjective:    Patient ID: Joel Donaldson, male    DOB: Sep 21, 1940, 74 y.o.   MRN: [id number]   CC: Pain medicine follow up, ED follow up  HPI 74 y/o with PMH of OA with chromic knee and right shoulder pain presenting for ED follow up as well as pain medication refill  Chronic Pain Reports stable knee and shoulder pain for which he takes 2-3 percocet per day. He has been trying to take more tylenol  Instead of percocet but feel the tylenol does not work as well  ED follow up Went to the ED after " passing out". States he went to take his medicine. And while going to put it in his mouth, he fell backwards on the bed. His granddaughter witnessed this. He denies being told he had shaking movements. He remembers only waking to EMS around him. He states EMS told his his blood pressure was low. He denies dizziness, lighthedness prior to passing out. Reports he a similar episode approx 2 years ago where he was walking in a parking lot and suddenly fell " flat on his face". He went to the ED then and was told this occurred because his blood pressure was low He takes his blood pressure daily and states it is usually 130s/60s-80s. Denies lightheadedness, dizziness, SOB, chest pain , palpitation   Review of Systems   See HPI for ROS.   Past Medical History  Diagnosis Date  . Arthritis     hands, back, knees  . Anxiety   . Depression   . GERD (gastroesophageal reflux disease)   . Glaucoma   . Hypertension   . Prostate cancer ([place])   . Heart murmur asymptomatic  . Short of breath on exertion   . Asthma   . Nocturia   . Hx of radioisotope therapy 07/26/11    Radiosactive Prostate Seed Implant/ I-125 Seeds  . Chest pain at rest 12/19/2011  . Rheumatoid arthritis ([place]) 10/10/2012   Past Surgical History  Procedure Laterality Date  . Shoulder surgery  YRS AGO    LEFT  . Lumbar fusion  1995  . Radioactive seed implant  09/14/2011    Procedure: RADIOACTIVE SEED IMPLANT;  Surgeon:  Hanley Ben, MD;  Location: [facility];  Service: Urology;  Laterality: N/A;  Total number of seeds -    . Cystoscopy  09/14/2011    Procedure: CYSTOSCOPY FLEXIBLE;  Surgeon: Hanley Ben, MD;  Location: [facility];  Service: Urology;  Laterality: N/A;  No seeds seen in the bladder    Social History   Social History  . Marital Status: Legally Separated    Spouse Name: Deneise Lever  . Number of Children: 1  . Years of Education: 11   Occupational History  . Retired    Social History Main Topics  . Smoking status: Former Smoker -- 2.00 packs/day for 50 years    Types: Cigarettes    Quit date: 10/13/2004  . Smokeless tobacco: Never Used     Comment: Still not smoking.   . Alcohol Use: 8.4 oz/week    14 Shots of liquor per week     Comment: occassional  . Drug Use: No  . Sexual Activity: Not on file   Other Topics Concern  . Not on file   Social History Narrative   Lives 1-story home with granddaughter and great-granddaughter.    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  Health Care POA:    Emergency Contact: wife, Stormy Sabol (c) [phone]   End of Life Plan:    Who lives with you: wife and granddaughter   Any pets: 2 dogs, Precious and Spike   Diet: Pt has a varied diet of protein, starch, vegetables.  Pt does not have any kind of modified diet, eats what he likes.   Exercise: Pt does not have regular exercise routine.   Seatbelts: Pt reports wearing seatbelt when in vehicles.    Sun Exposure/Protection:    Hobbies: fishing, hunting           Objective:  BP 132/84 mmHg  Pulse 106  Temp(Src) 98.4 F (36.9 C) (Oral)  Ht 5\' 10"  (1.778 m)  Wt 181 lb 2 oz (82.158 kg)  BMI 25.99 kg/m2  SpO2 97% Vitals and nursing note reviewed  General: NAD Cardiac: RRR,  Respiratory: CTAB, normal effort Extremities: no LE edema, non tender Skin: warm and dry, no rashes noted Neuro:  alert and oriented, no focal deficits   Assessment & Plan:    Encounter for chronic pain management Indication for chronic opioid: DJD Medication and dose: Percocet # pills per month: continue 90  Monthly, continue to discuss wean Last UDS date: 08/2014, last 2 UDS screens negative despite pt stating he uses percocet 2-3 times per day. Will obtain one today. Continue to discuss med compliance. Potentially negative due to narcotic metabolism differences Pain contract signed (Y/N): Y Date narcotic database last reviewed (include red flags): 12/07/2014  Syncope Syncope concern to be due to hypotension given stated hx of this in past. Blood pressures in clinic wnl - Pt will record BPs at home and bring to next office visit - will obtain echo as outpt to r/o cardiogenic cause. EKG at ED visit unchanged from prior      Alyssa A. Lincoln Brigham MD, [facility] Resident PGY-1 Pager [phone]

## 2014-12-07 NOTE — Assessment & Plan Note (Signed)
Syncope concern to be due to hypotension given stated hx of this in past. Blood pressures in clinic wnl - Pt will record BPs at home and bring to next office visit - will obtain echo as outpt to r/o cardiogenic cause. EKG at ED visit unchanged from prior

## 2014-12-08 DIAGNOSIS — I10 Essential (primary) hypertension: Secondary | ICD-10-CM | POA: Diagnosis not present

## 2014-12-08 LAB — DRUG SCR UR, PAIN MGMT, REFLEX CONF
AMPHETAMINE SCRN UR: NEGATIVE
BENZODIAZEPINES.: NEGATIVE
Barbiturate Quant, Ur: NEGATIVE
COCAINE METABOLITES: NEGATIVE
CREATININE, U: 100.55 mg/dL
MARIJUANA METABOLITE: NEGATIVE
METHADONE: NEGATIVE
Opiates: NEGATIVE
PHENCYCLIDINE (PCP): NEGATIVE
PROPOXYPHENE: NEGATIVE

## 2014-12-09 DIAGNOSIS — I10 Essential (primary) hypertension: Secondary | ICD-10-CM | POA: Diagnosis not present

## 2014-12-10 DIAGNOSIS — I10 Essential (primary) hypertension: Secondary | ICD-10-CM | POA: Diagnosis not present

## 2014-12-11 DIAGNOSIS — I10 Essential (primary) hypertension: Secondary | ICD-10-CM | POA: Diagnosis not present

## 2014-12-12 DIAGNOSIS — I10 Essential (primary) hypertension: Secondary | ICD-10-CM | POA: Diagnosis not present

## 2014-12-13 DIAGNOSIS — I10 Essential (primary) hypertension: Secondary | ICD-10-CM | POA: Diagnosis not present

## 2014-12-14 DIAGNOSIS — I10 Essential (primary) hypertension: Secondary | ICD-10-CM | POA: Diagnosis not present

## 2014-12-15 DIAGNOSIS — I10 Essential (primary) hypertension: Secondary | ICD-10-CM | POA: Diagnosis not present

## 2014-12-15 DIAGNOSIS — G8929 Other chronic pain: Secondary | ICD-10-CM | POA: Insufficient documentation

## 2014-12-16 DIAGNOSIS — I10 Essential (primary) hypertension: Secondary | ICD-10-CM | POA: Diagnosis not present

## 2014-12-17 DIAGNOSIS — I10 Essential (primary) hypertension: Secondary | ICD-10-CM | POA: Diagnosis not present

## 2014-12-18 DIAGNOSIS — I10 Essential (primary) hypertension: Secondary | ICD-10-CM | POA: Diagnosis not present

## 2014-12-19 DIAGNOSIS — I10 Essential (primary) hypertension: Secondary | ICD-10-CM | POA: Diagnosis not present

## 2014-12-20 DIAGNOSIS — I10 Essential (primary) hypertension: Secondary | ICD-10-CM | POA: Diagnosis not present

## 2014-12-21 DIAGNOSIS — I10 Essential (primary) hypertension: Secondary | ICD-10-CM | POA: Diagnosis not present

## 2014-12-22 DIAGNOSIS — I10 Essential (primary) hypertension: Secondary | ICD-10-CM | POA: Diagnosis not present

## 2014-12-23 DIAGNOSIS — I10 Essential (primary) hypertension: Secondary | ICD-10-CM | POA: Diagnosis not present

## 2014-12-24 DIAGNOSIS — I10 Essential (primary) hypertension: Secondary | ICD-10-CM | POA: Diagnosis not present

## 2014-12-25 DIAGNOSIS — I10 Essential (primary) hypertension: Secondary | ICD-10-CM | POA: Diagnosis not present

## 2014-12-26 DIAGNOSIS — I10 Essential (primary) hypertension: Secondary | ICD-10-CM | POA: Diagnosis not present

## 2014-12-27 DIAGNOSIS — I10 Essential (primary) hypertension: Secondary | ICD-10-CM | POA: Diagnosis not present

## 2014-12-28 DIAGNOSIS — I10 Essential (primary) hypertension: Secondary | ICD-10-CM | POA: Diagnosis not present

## 2014-12-29 DIAGNOSIS — I10 Essential (primary) hypertension: Secondary | ICD-10-CM | POA: Diagnosis not present

## 2014-12-30 ENCOUNTER — Other Ambulatory Visit: Payer: Self-pay | Admitting: Family Medicine

## 2014-12-30 DIAGNOSIS — I10 Essential (primary) hypertension: Secondary | ICD-10-CM | POA: Diagnosis not present

## 2014-12-30 NOTE — Telephone Encounter (Signed)
Proair refilled  

## 2014-12-31 DIAGNOSIS — I10 Essential (primary) hypertension: Secondary | ICD-10-CM | POA: Diagnosis not present

## 2015-01-01 DIAGNOSIS — I10 Essential (primary) hypertension: Secondary | ICD-10-CM | POA: Diagnosis not present

## 2015-01-02 DIAGNOSIS — I10 Essential (primary) hypertension: Secondary | ICD-10-CM | POA: Diagnosis not present

## 2015-01-04 ENCOUNTER — Encounter: Payer: Self-pay | Admitting: Student

## 2015-01-04 ENCOUNTER — Ambulatory Visit (INDEPENDENT_AMBULATORY_CARE_PROVIDER_SITE_OTHER): Payer: Medicare Other | Admitting: Student

## 2015-01-04 VITALS — BP 150/90 | HR 92 | Temp 98.0°F | Ht 70.0 in | Wt 180.0 lb

## 2015-01-04 DIAGNOSIS — G8929 Other chronic pain: Secondary | ICD-10-CM | POA: Diagnosis not present

## 2015-01-04 DIAGNOSIS — M19019 Primary osteoarthritis, unspecified shoulder: Secondary | ICD-10-CM | POA: Diagnosis not present

## 2015-01-04 MED ORDER — OXYCODONE-ACETAMINOPHEN 10-325 MG PO TABS: 1.0000 | ORAL_TABLET | Freq: Two times a day (BID) | ORAL | Status: DC

## 2015-01-04 NOTE — Patient Instructions (Signed)
Follow up in 1 month If you have questions or concerns, call the office at [phone]

## 2015-01-05 ENCOUNTER — Other Ambulatory Visit: Payer: Self-pay | Admitting: Student

## 2015-01-05 NOTE — Assessment & Plan Note (Signed)
-  Percocet refilled, # 30 - will continue to encourage supplementation of narcotic pain medication with OTC meds - consider initiation of tramadol at next visit

## 2015-01-05 NOTE — Telephone Encounter (Signed)
Prilosec refilled.

## 2015-01-05 NOTE — Assessment & Plan Note (Signed)
Stable with no repeat episodes or concerning symptoms - Pt to bring blood pressures at next visit - follow echocardiogram

## 2015-01-05 NOTE — Progress Notes (Signed)
Subjective:    Patient ID: Joel Donaldson, male    DOB: [birth year], 74 y.o.   MRN: [id number]   CC: follow up, refill med  HPI 74 y/o M with PMH of OA, additionally had a recent fall of unknown cause   OA - On percocet 2-3 per day - feels that his knee pain has worsened with the cold weather and has needed to use his percocet more to sleep - Has been using tylenol to supplement use of percocet but this is not always helpful  Recent fall - At that visit there was some concern that he may have a cardiac cause versus was having episodes of hypotension - an echocardiogram was ordered but he did not obtain it - he was also asked to take his blood pressures at home,  record them and bring this in. He states that his home aide misplaced this record so he was unable to bring them to this visit - He remembers that they typically range from 150s/80s before taking blood pressure medication to 120s/170s after - He has not had any falls in since last being seen - he denies dizziness, lightheadedness, chest pain or SOB  Denies recent illness, fever, cough  Review of Systems   See HPI for ROS.   Past Medical History  Diagnosis Date  . Arthritis     hands, back, knees  . Anxiety   . Depression   . GERD (gastroesophageal reflux disease)   . Glaucoma   . Hypertension   . Prostate cancer ([place])   . Heart murmur asymptomatic  . Short of breath on exertion   . Asthma   . Nocturia   . Hx of radioisotope therapy 07/26/11    Radiosactive Prostate Seed Implant/ I-125 Seeds  . Chest pain at rest 12/19/2011  . Rheumatoid arthritis ([place]) 10/10/2012   Past Surgical History  Procedure Laterality Date  . Shoulder surgery  YRS AGO    LEFT  . Lumbar fusion  1995  . Radioactive seed implant  09/14/2011    Procedure: RADIOACTIVE SEED IMPLANT;  Surgeon: Hanley Ben, MD;  Location: [facility];  Service: Urology;  Laterality: N/A;  Total number of seeds -    . Cystoscopy  09/14/2011     Procedure: CYSTOSCOPY FLEXIBLE;  Surgeon: Hanley Ben, MD;  Location: [facility];  Service: Urology;  Laterality: N/A;  No seeds seen in the bladder    Social History   Social History  . Marital Status: Legally Separated    Spouse Name: Deneise Lever  . Number of Children: 1  . Years of Education: 11   Occupational History  . Retired    Social History Main Topics  . Smoking status: Former Smoker -- 2.00 packs/day for 50 years    Types: Cigarettes    Quit date: 10/13/2004  . Smokeless tobacco: Never Used     Comment: Still not smoking.   . Alcohol Use: 8.4 oz/week    14 Shots of liquor per week     Comment: occassional  . Drug Use: No  . Sexual Activity: Not on file   Other Topics Concern  . Not on file   Social History Narrative   Lives 1-story home with granddaughter and great-granddaughter.    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       Health Care POA:    Emergency Contact: wife, Tashan Cruzhernandez (c) [phone]   End of Life  Plan:    Who lives with you: wife and granddaughter   Any pets: 2 dogs, Precious and Spike   Diet: Pt has a varied diet of protein, starch, vegetables.  Pt does not have any kind of modified diet, eats what he likes.   Exercise: Pt does not have regular exercise routine.   Seatbelts: Pt reports wearing seatbelt when in vehicles.    Sun Exposure/Protection:    Hobbies: fishing, hunting           Objective:  BP 150/90 mmHg  Pulse 92  Temp(Src) 98 F (36.7 C) (Oral)  Ht 5\' 10"  (1.778 m)  Wt 180 lb (81.647 kg)  BMI 25.83 kg/m2  SpO2 99% Vitals and nursing note reviewed  General: NAD Cardiac: RRR,  Respiratory: CTAB, normal effort Abdomen: soft, nontender,  Extremities: bilaterally knee tenderness to palpation, normal range of motion Skin: warm and dry, no rashes noted Neuro: alert and oriented, no focal deficits   Assessment & Plan:    Chronic  pain -Percocet refilled, # 90 - will continue to encourage supplementation of narcotic pain medication with OTC meds - consider initiation of tramadol at next visit  Syncope Stable with no repeat episodes or concerning symptoms - Pt to bring blood pressures at next visit - follow echocardiogram     Alyssa A. Lincoln Brigham MD, [facility] Resident PGY-1 Pager [phone]

## 2015-02-01 ENCOUNTER — Encounter: Payer: Self-pay | Admitting: Student

## 2015-02-01 ENCOUNTER — Ambulatory Visit (INDEPENDENT_AMBULATORY_CARE_PROVIDER_SITE_OTHER): Payer: Medicare Other | Admitting: Student

## 2015-02-01 VITALS — BP 138/64 | HR 94 | Temp 98.2°F | Ht 70.0 in | Wt 184.0 lb

## 2015-02-01 DIAGNOSIS — M19019 Primary osteoarthritis, unspecified shoulder: Secondary | ICD-10-CM

## 2015-02-01 DIAGNOSIS — I10 Essential (primary) hypertension: Secondary | ICD-10-CM | POA: Diagnosis not present

## 2015-02-01 DIAGNOSIS — Z7189 Other specified counseling: Secondary | ICD-10-CM

## 2015-02-01 DIAGNOSIS — G8929 Other chronic pain: Secondary | ICD-10-CM

## 2015-02-01 MED ORDER — OXYCODONE-ACETAMINOPHEN 10-325 MG PO TABS: 1.0000 | ORAL_TABLET | Freq: Two times a day (BID) | ORAL | Status: DC

## 2015-02-01 NOTE — Progress Notes (Signed)
Subjective:    Patient ID: Joel Donaldson, male    DOB: [birth year], 74 y.o.   MRN: [id number]   CC: medication refill  HPI 74 y/o M with PMH of HTN, osteoarthritis presents for medication refill  Osteo arthritis - Takes 2-3 percocet per day for arthritis - states the cold weather has made his knee pain worse and he has needed to take percocet 3 times per day - denies weakness, decreased ROM - denies recent falls  HTN - Has been checking blood pressure at home and has typically been in the 140s-150s - he has had higher blood pressures to the high [id number]  systolicbut typically get those up to 2 times per week - lows go to the [id number] systolic - denies headache, chest pain, SOB  Denies fever, chills, SOB  Review of Systems   See HPI for ROS.   Past Medical History  Diagnosis Date  . Arthritis     hands, back, knees  . Anxiety   . Depression   . GERD (gastroesophageal reflux disease)   . Glaucoma   . Hypertension   . Prostate cancer ([place])   . Heart murmur asymptomatic  . Short of breath on exertion   . Asthma   . Nocturia   . Hx of radioisotope therapy 07/26/11    Radiosactive Prostate Seed Implant/ I-125 Seeds  . Chest pain at rest 12/19/2011  . Rheumatoid arthritis ([place]) 10/10/2012   Past Surgical History  Procedure Laterality Date  . Shoulder surgery  YRS AGO    LEFT  . Lumbar fusion  1995  . Radioactive seed implant  09/14/2011    Procedure: RADIOACTIVE SEED IMPLANT;  Surgeon: Hanley Ben, MD;  Location: [facility];  Service: Urology;  Laterality: N/A;  Total number of seeds -    . Cystoscopy  09/14/2011    Procedure: CYSTOSCOPY FLEXIBLE;  Surgeon: Hanley Ben, MD;  Location: [facility];  Service: Urology;  Laterality: N/A;  No seeds seen in the bladder    Social History   Social History  . Marital Status: Legally Separated    Spouse Name: Deneise Lever  . Number of Children: 1  . Years of Education: 11   Occupational History    . Retired    Social History Main Topics  . Smoking status: Former Smoker -- 2.00 packs/day for 50 years    Types: Cigarettes    Quit date: 10/13/2004  . Smokeless tobacco: Never Used     Comment: Still not smoking.   . Alcohol Use: 8.4 oz/week    14 Shots of liquor per week     Comment: occassional  . Drug Use: No  . Sexual Activity: Not on file   Other Topics Concern  . Not on file   Social History Narrative   Lives 1-story home with granddaughter and great-granddaughter.    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       Health Care POA:    Emergency Contact: wife, Baily Magro (c) [phone]   End of Life Plan:    Who lives with you: wife and granddaughter   Any pets: 2 dogs, Precious and Spike   Diet: Pt has a varied diet of protein, starch, vegetables.  Pt does not have any kind of modified diet, eats what he likes.   Exercise: Pt does not have regular exercise routine.   Seatbelts: Pt reports wearing seatbelt when in vehicles.  Sun Exposure/Protection:    Hobbies: fishing, hunting           Objective:  BP 138/64 mmHg  Pulse 94  Temp(Src) 98.2 F (36.8 C) (Oral)  Ht 5\' 10"  (1.778 m)  Wt 184 lb (83.462 kg)  BMI 26.40 kg/m2 Vitals and nursing note reviewed  General: NAD Cardiac: RRR,  Respiratory: CTAB, normal effort Extremities: bilateral knee tenderness, normal ROM Skin: warm and dry, no rashes noted Neuro: alert and oriented, no focal deficits   Assessment & Plan:    Encounter for chronic pain management Percocet # 63 refilled for chronic pain due to DJD, encouraged to try tylenol and NSAIDs to supplement Last UDS date: 12/07/2014 Pain contract signed (Y/N): Yes Date narcotic database last reviewed (include red flags): no   HYPERTENSION, BENIGN SYSTEMIC Blood pressure elevated to 157/94 buton repeat after approx 15 mins, decreased to 138/64. Pt does record blood pressures at home with  SBP range 120s-160s. Given normal BP on repeat will not change antihypertensive regimen now - pt to bring record blood pressure readings to next visit - continue current regimen, lisinopril-HCTZ     Tobey Schmelzle A. Lincoln Brigham MD, [facility] Resident PGY-2 Pager [phone]

## 2015-02-01 NOTE — Assessment & Plan Note (Addendum)
Percocet # 66 refilled for chronic pain due to DJD, encouraged to try tylenol and NSAIDs to supplement Last UDS date: 12/07/2014 Pain contract signed (Y/N): Yes Date narcotic database last reviewed (include red flags): no

## 2015-02-01 NOTE — Patient Instructions (Addendum)
Follow up in one month  Please bring your home blood pressure recordings with you to this visit If you have qjjjuestions or concerns, please call the office at [phone]

## 2015-02-02 NOTE — Assessment & Plan Note (Signed)
Blood pressure elevated to 157/94 buton repeat after approx 15 mins, decreased to 138/64. Pt does record blood pressures at home with SBP range 120s-160s. Given normal BP on repeat will not change antihypertensive regimen now - pt to bring record blood pressure readings to next visit - continue current regimen, lisinopril-HCTZ

## 2015-03-07 ENCOUNTER — Ambulatory Visit: Payer: Medicare Other | Admitting: Student

## 2015-03-07 DIAGNOSIS — I10 Essential (primary) hypertension: Secondary | ICD-10-CM | POA: Diagnosis not present

## 2015-03-08 ENCOUNTER — Encounter: Payer: Self-pay | Admitting: Student

## 2015-03-08 ENCOUNTER — Ambulatory Visit (INDEPENDENT_AMBULATORY_CARE_PROVIDER_SITE_OTHER): Payer: Medicare Other | Admitting: Student

## 2015-03-08 VITALS — BP 138/84 | HR 98 | Temp 97.7°F | Ht 70.0 in | Wt 184.0 lb

## 2015-03-08 DIAGNOSIS — G8929 Other chronic pain: Secondary | ICD-10-CM

## 2015-03-08 DIAGNOSIS — I10 Essential (primary) hypertension: Secondary | ICD-10-CM | POA: Diagnosis not present

## 2015-03-08 DIAGNOSIS — Z7189 Other specified counseling: Secondary | ICD-10-CM | POA: Diagnosis not present

## 2015-03-08 DIAGNOSIS — M19019 Primary osteoarthritis, unspecified shoulder: Secondary | ICD-10-CM | POA: Diagnosis not present

## 2015-03-08 MED ORDER — OXYCODONE-ACETAMINOPHEN 10-325 MG PO TABS: 1.0000 | ORAL_TABLET | Freq: Two times a day (BID) | ORAL | Status: DC

## 2015-03-08 NOTE — Assessment & Plan Note (Signed)
Refilled Percocet # 90 to be taken 2-3 times per day.  - Will continue to follow pain  - continue to discuss staying active as tolerated

## 2015-03-08 NOTE — Assessment & Plan Note (Signed)
Initial blood pressure 153/101. On repeat blood pressure improved to 130/84. Blood pressures at home typically are in the 150s over 90s with some lows in the 100s over 60s. Given these lows will continue current course of antihypertensive  and not increase them. -  We'll continue to follow.

## 2015-03-08 NOTE — Patient Instructions (Signed)
Follow-up in 1 month PCP  Otherwise significant issues or questions concerns please call the office at [phone]

## 2015-03-08 NOTE — Progress Notes (Signed)
   Subjective:    Patient ID: Joel Donaldson, male    DOB: March 02, 1940, 75 y.o.   MRN: [id number]   CC:  HPI: 75 year old male is presenting for follow-up of  chronic pain due to DJD and medication refill  DJD -  Stable pain in knees. He feels like his pain was worse when the weather is very cold. -  Has been taking his Percocet  2-3 times per day as needed for pain - he has 6 tabs left from his last month's refill -  Denies weakness or difficulty ambulating -  He does occasionally use a cane but  Did not feel he needed one today  Blood pressure -  Brought his blood pressure  recordings with him -  Blood pressures have been  150s over 90s.  Has had some infrequent low-dose systolics in the low [id number] -  Denies any recent falls lightheadedness or headache  Review of Systems ROS   He denies shortness of breath chest pain recent illness fever nausea vomiting or diarrhea. He denies headache or N vision Past Medical, Surgical, Social, and Family History Reviewed & Updated per EMR.   Objective:  BP 138/84 mmHg  Pulse 98  Temp(Src) 97.7 F (36.5 C) (Oral)  Ht 5\' 10"  (1.778 m)  Wt 184 lb (83.462 kg)  BMI 26.40 kg/m2 Vitals and nursing note reviewed  General: NAD Cardiac: RRR, Respiratory: CTAB, normal effort Extremities:  Mild bilateral knee tenderness to palpation. Mild swelling of left knee, no swelling of right knee, no overlying erythema or skin lesions Skin: warm and dry, no rashes noted Neuro: alert and oriented, no focal deficits   Assessment & Plan:    Encounter for chronic pain management Refilled Percocet # 90 to be taken 2-3 times per day.  - Will continue to follow pain  - continue to discuss staying active as tolerated  HYPERTENSION, BENIGN SYSTEMIC  Initial blood pressure 153/101. On repeat blood pressure improved to 130/84. Blood pressures at home typically are in the 150s over 90s with some lows in the 100s over 60s. Given these lows will continue current  course of antihypertensive  and not increase them. -  We'll continue to follow.     Blaze Sandin A. Lincoln Brigham MD, [facility] [facility] Resident PGY-2 Pager [phone]

## 2015-03-09 DIAGNOSIS — I10 Essential (primary) hypertension: Secondary | ICD-10-CM | POA: Diagnosis not present

## 2015-03-17 ENCOUNTER — Emergency Department (HOSPITAL_COMMUNITY)
Admission: EM | Admit: 2015-03-17 | Discharge: 2015-03-18 | Disposition: A | Discharge status: Home / Self Care | Payer: Medicare Other | Attending: Emergency Medicine | Admitting: Emergency Medicine

## 2015-03-17 ENCOUNTER — Encounter (HOSPITAL_COMMUNITY): Payer: Self-pay | Admitting: Emergency Medicine

## 2015-03-17 ENCOUNTER — Emergency Department (HOSPITAL_COMMUNITY): Payer: Medicare Other

## 2015-03-17 DIAGNOSIS — Y9389 Activity, other specified: Secondary | ICD-10-CM | POA: Insufficient documentation

## 2015-03-17 DIAGNOSIS — K219 Gastro-esophageal reflux disease without esophagitis: Secondary | ICD-10-CM | POA: Insufficient documentation

## 2015-03-17 DIAGNOSIS — Z23 Encounter for immunization: Secondary | ICD-10-CM | POA: Insufficient documentation

## 2015-03-17 DIAGNOSIS — I10 Essential (primary) hypertension: Secondary | ICD-10-CM | POA: Diagnosis not present

## 2015-03-17 DIAGNOSIS — R011 Cardiac murmur, unspecified: Secondary | ICD-10-CM | POA: Diagnosis not present

## 2015-03-17 DIAGNOSIS — Y998 Other external cause status: Secondary | ICD-10-CM | POA: Insufficient documentation

## 2015-03-17 DIAGNOSIS — J45909 Unspecified asthma, uncomplicated: Secondary | ICD-10-CM | POA: Insufficient documentation

## 2015-03-17 DIAGNOSIS — M069 Rheumatoid arthritis, unspecified: Secondary | ICD-10-CM | POA: Diagnosis not present

## 2015-03-17 DIAGNOSIS — Z8669 Personal history of other diseases of the nervous system and sense organs: Secondary | ICD-10-CM | POA: Insufficient documentation

## 2015-03-17 DIAGNOSIS — Z87891 Personal history of nicotine dependence: Secondary | ICD-10-CM | POA: Diagnosis not present

## 2015-03-17 DIAGNOSIS — Y9289 Other specified places as the place of occurrence of the external cause: Secondary | ICD-10-CM | POA: Diagnosis not present

## 2015-03-17 DIAGNOSIS — Z923 Personal history of irradiation: Secondary | ICD-10-CM | POA: Diagnosis not present

## 2015-03-17 DIAGNOSIS — Z8659 Personal history of other mental and behavioral disorders: Secondary | ICD-10-CM | POA: Insufficient documentation

## 2015-03-17 DIAGNOSIS — S61411A Laceration without foreign body of right hand, initial encounter: Secondary | ICD-10-CM | POA: Diagnosis not present

## 2015-03-17 DIAGNOSIS — W540XXA Bitten by dog, initial encounter: Secondary | ICD-10-CM | POA: Diagnosis not present

## 2015-03-17 DIAGNOSIS — M1990 Unspecified osteoarthritis, unspecified site: Secondary | ICD-10-CM | POA: Insufficient documentation

## 2015-03-17 DIAGNOSIS — Z79899 Other long term (current) drug therapy: Secondary | ICD-10-CM | POA: Insufficient documentation

## 2015-03-17 DIAGNOSIS — Z8546 Personal history of malignant neoplasm of prostate: Secondary | ICD-10-CM | POA: Insufficient documentation

## 2015-03-17 DIAGNOSIS — S61451A Open bite of right hand, initial encounter: Secondary | ICD-10-CM | POA: Diagnosis not present

## 2015-03-17 MED ORDER — TETANUS-DIPHTH-ACELL PERTUSSIS 5-2.5-18.5 LF-MCG/0.5 IM SUSP
0.5000 mL | Freq: Once | INTRAMUSCULAR | Status: AC
  Administered 2015-03-17: 0.5 mL via INTRAMUSCULAR
  Filled 2015-03-17: qty 0.5

## 2015-03-17 MED ORDER — AMOXICILLIN-POT CLAVULANATE 875-125 MG PO TABS: 1.0000 | ORAL_TABLET | Freq: Two times a day (BID) | ORAL | Status: DC

## 2015-03-17 NOTE — ED Notes (Signed)
Pt st's his granddaughters dog bit him approx 1 hour ago on right hand.  Pt has 2 lacs to top of right hand.  No bleeding at this time  St's dogs shots are up to date

## 2015-03-17 NOTE — Discharge Instructions (Signed)
Take Augmentin as directed, continue usual home medications Keep area clean and dry.  Please follow up with your primary doctor, urgent care, or return to ER in 2-3 days for wound check. Please return to the ER for redness forming around area, fever, new or worsening symptoms, any additional concerns.

## 2015-03-17 NOTE — ED Provider Notes (Signed)
CSN: [id number]     Arrival date & time 03/17/15  2210 History  By signing my name below, I, Joel Donaldson, attest that this documentation has been prepared under the direction and in the presence of [facility], PA-C. Electronically Signed: Starleen Donaldson ED Scribe. 03/17/2015. 10:12 PM.  Chief Complaint  Patient presents with  . Animal Bite   The history is provided by the patient and medical records. No language interpreter was used.    HPI Comments: Joel Donaldson is a 75 y.o. male who presents to the Emergency Department complaining of a provoked dog bite on the right hand by a known dog (granddaughter's) 1 hour ago. The patient reports he was trying to wrestle chicken from the dog that had been stolen from the dining table. The patient reports the dog has had the rabies vaccine.  Patient takes a daily baby ASA but does not use other anti-coagulants. Last tetanus shot unknown.   Past Medical History  Diagnosis Date  . Arthritis     hands, back, knees  . Anxiety   . Depression   . GERD (gastroesophageal reflux disease)   . Glaucoma   . Hypertension   . Prostate cancer ([place])   . Heart murmur asymptomatic  . Short of breath on exertion   . Asthma   . Nocturia   . Hx of radioisotope therapy 07/26/11    Radiosactive Prostate Seed Implant/ I-125 Seeds  . Chest pain at rest 12/19/2011  . Rheumatoid arthritis ([place]) 10/10/2012   Past Surgical History  Procedure Laterality Date  . Shoulder surgery  YRS AGO    LEFT  . Lumbar fusion  1995  . Radioactive seed implant  09/14/2011    Procedure: RADIOACTIVE SEED IMPLANT;  Surgeon: Hanley Ben, MD;  Location[facility];  Service: Urology;  Laterality: N/A;  Total number of seeds -    . Cystoscopy  09/14/2011    Procedure: CYSTOSCOPY FLEXIBLE;  Surgeon: Hanley Ben, MD;  Location: [facility];  Service: Urology;  Laterality: N/A;  No seeds seen in the bladder   Family History  Problem Relation Age of Onset   . Diabetes Mother    Social History  Substance Use Topics  . Smoking status: Former Smoker -- 2.00 packs/day for 50 years    Types: Cigarettes    Quit date: 10/13/2004  . Smokeless tobacco: Never Used     Comment: Still not smoking.   . Alcohol Use: 8.4 oz/week    14 Shots of liquor per week     Comment: occassional    Review of Systems  Constitutional: Negative for fever.  Skin: Positive for wound.      Allergies  Tramadol and Pollen extract  Home Medications   Prior to Admission medications   Medication Sig Start Date End Date Taking? Authorizing Provider  acetaminophen (TYLENOL) 325 MG tablet Take 2 tablets (650 mg total) by mouth every 6 (six) hours as needed. Patient not taking: Reported on 11/14/2014 07/13/14   Veatrice Bourbon, MD  albuterol (PROVENTIL HFA;VENTOLIN HFA) 108 (90 BASE) MCG/ACT inhaler Inhale 2 puffs into the lungs every 6 (six) hours as needed for wheezing or shortness of breath (wheezing).    Historical Provider, MD  amoxicillin-clavulanate (AUGMENTIN) 875-125 MG tablet Take 1 tablet by mouth every 12 (twelve) hours. 03/17/15   Ozella Almond Umer Harig, PA-C  calcium carbonate (TUMS - DOSED IN MG ELEMENTAL CALCIUM) 500 MG chewable tablet Chew 1 tablet by mouth daily as  needed for indigestion or heartburn.    Historical Provider, MD  ergocalciferol (VITAMIN D2) 50000 UNITS capsule Take 1 capsule (50,000 Units total) by mouth once a week. Patient not taking: Reported on 11/14/2014 07/07/13   Waldemar Dickens, MD  HYDROcodone-acetaminophen (NORCO/VICODIN) 5-325 MG per tablet Take 1 tablet by mouth every 6 (six) hours as needed. Patient not taking: Reported on 07/13/2014 06/17/14   Delice Bison Zamarion Longest, DO  ibuprofen (MOTRIN IB) 200 MG tablet Take 3 tablets (600 mg total) by mouth every 6 (six) hours. Patient not taking: Reported on 11/14/2014 04/15/14 04/25/15  Veatrice Bourbon, MD  lisinopril-hydrochlorothiazide (PRINZIDE,ZESTORETIC) 20-12.5 MG per tablet Take 1 tablet by mouth daily.  11/01/14   Vivi Barrack, MD  nitroGLYCERIN (NITROSTAT) 0.4 MG SL tablet Place 0.4 mg under the tongue every 5 (five) minutes as needed for chest pain (chest pain).  12/20/11   Dawson, MD  omeprazole (PRILOSEC) 40 MG capsule TAKE ONE CAPSULE BY MOUTH EVERY MORNING 01/05/15   Alyssa A Haney, MD  ondansetron (ZOFRAN ODT) 4 MG disintegrating tablet Take 1 tablet (4 mg total) by mouth every 8 (eight) hours as needed for nausea or vomiting. Patient not taking: Reported on 11/14/2014 06/17/14   Delice Bison Coreyon Nicotra, DO  oxyCODONE-acetaminophen (PERCOCET) 10-325 MG tablet Take 1 tablet by mouth 2 (two) times daily. 03/08/15   Alyssa A Haney, MD  PROAIR HFA 108 (90 BASE) MCG/ACT inhaler INHALE 2 PUFFS INTO THE LUNGS EVERY 6 HOURS AS NEEDED FOR WHEEZING OR SHORTNESS OF BREATH 12/30/14   Alyssa A Lincoln Brigham, MD  promethazine (PHENERGAN) 25 MG tablet Take 1 tablet (25 mg total) by mouth every 8 (eight) hours as needed for nausea or vomiting. Patient not taking: Reported on 11/14/2014 11/03/14   [place] N Rumley, DO   BP 106/70 mmHg  Pulse 115  Temp(Src) 98.4 F (36.9 C) (Oral)  Resp 18  SpO2 98% Physical Exam  Constitutional: He is oriented to person, place, and time. He appears well-developed and well-nourished. No distress.  HENT:  Head: Normocephalic and atraumatic.  Neck: Neck supple. No tracheal deviation present.  Cardiovascular: Normal rate and regular rhythm.   Pulmonary/Chest: Effort normal and breath sounds normal. No respiratory distress.  Abdominal: Soft. He exhibits no distension. There is no tenderness.  Musculoskeletal: Normal range of motion.  Neurological: He is alert and oriented to person, place, and time.  Skin: Skin is warm and dry.  3 cm lac and 1.5 cm lac of right hand. Full ROM. Sensory intact. 2+ radial pulse. Good cap refill.   Psychiatric: He has a normal mood and affect. His behavior is normal.  Nursing note and vitals reviewed.   ED Course  Procedures (including  critical care time)  DIAGNOSTIC STUDIES: Oxygen Saturation is 98% on RA, adequate by my interpretation.    COORDINATION OF CARE:  10:27 PM Discussed plans to clean wound and reevaluate.  Patient acknowledges and agrees with plan.    LACERATION REPAIR PROCEDURE NOTE The patient's identification was confirmed and consent was obtained. This procedure was performed by Pearlie Oyster, PA-C at 11:11 PM. Site: Right hand Sterile procedures observed Anesthetic used (type and amt): none Suture type/size: 4-0  Length:3 cm # of Sutures: 1 Technique:simple interrupted  Antibx ointment applied  Tetanus ordered Site irrigated with NS and cleaned with povidone iodine, explored without evidence of foreign body, wound well approximated, site covered with dry, sterile dressing.  Patient tolerated procedure well without complications. Instructions for care discussed verbally  and patient provided with additional written instructions for homecare and f/u.  Labs Review Labs Reviewed - No data to display  Imaging Review Dg Hand Complete Right  03/17/2015  CLINICAL DATA:  Bit by dog. Deep lacerations to posterior aspect of right hand. EXAM: RIGHT HAND - COMPLETE 3+ VIEW COMPARISON:  None. FINDINGS: There is no evidence of fracture or dislocation. Chronic appearing volar subluxation of the second metacarpal phalangeal joint is noted. There is no evidence of arthropathy or other focal bone abnormality. Soft tissues are unremarkable. No radiopaque foreign bodies noted. IMPRESSION: 1. No acute bone abnormality identified. Electronically Signed   By: Kerby Moors M.D.   On: 03/17/2015 23:11   I have personally reviewed and evaluated these images and lab results as part of my medical decision-making.   EKG Interpretation None      MDM   Final diagnoses:  Dog bite  Laceration of hand, right, initial encounter   MYER DAGGETT presents for dog bite PTA. Dog known and vaccinated. Last tetanus unknown --  tdap in ED. X-ray hand shows no acute abnormalities. Wounds cleaned thoroughly. 1 suture placed in 3 cm lac. Wounds dressed. Will place on Augmentin and recommend follow up appointment in 2-3 days.  Patient discussed with Dr. Audie Pinto who agrees with treatment plan.   I personally performed the services described in this documentation, which was scribed in my presence. The recorded information has been reviewed and is accurate.  [facility] Rashaun Curl, PA-C 03/18/15 0020  Leonard Schwartz, MD 03/20/15 [phone]

## 2015-03-21 ENCOUNTER — Encounter: Payer: Self-pay | Admitting: Student

## 2015-03-21 ENCOUNTER — Ambulatory Visit (INDEPENDENT_AMBULATORY_CARE_PROVIDER_SITE_OTHER): Payer: Medicare Other | Admitting: Student

## 2015-03-21 VITALS — BP 100/60 | HR 97 | Temp 98.6°F | Ht 70.0 in | Wt 182.0 lb

## 2015-03-21 DIAGNOSIS — W540XXA Bitten by dog, initial encounter: Secondary | ICD-10-CM | POA: Insufficient documentation

## 2015-03-21 DIAGNOSIS — T148 Other injury of unspecified body region: Secondary | ICD-10-CM

## 2015-03-21 DIAGNOSIS — I10 Essential (primary) hypertension: Secondary | ICD-10-CM | POA: Diagnosis not present

## 2015-03-21 NOTE — Patient Instructions (Signed)
Follow up in 1 week for suture removal If you notice worsening redness, or pain call the office If you have questions or concerns, call the office at [phone]

## 2015-03-21 NOTE — Assessment & Plan Note (Signed)
Well-healing dogbite with no signs of infection. Will continue Augmentin to complete 14 day course. Will return in one week to remove suture.  Return precautions discussed

## 2015-03-21 NOTE — Progress Notes (Signed)
   Subjective:    Patient ID: Joel Donaldson, male    DOB: [birth year], 75 y.o.   MRN: [id number]   CC: ED follow-up for dog bite on right hand  HPI:  75 year old male presenting foreign ED visit after dog bite on his right hand   Dog bite -  He was seen on December 2 after his doctors thought that his right hand -   His dog reportedly was up-to-date on his vaccinations -  In the emergency room he had single sutures stitched and was started on Augmentin -  Since the bite he denies fevers, chills. -  However he has noted loose stools since starting the Augmentin. He feels like this is improving -  The pain at his hand is improving. Denies redness or swelling of the area  Review of Systems ROS  per the history of present illness, otherwise he denies chest pain, shortness of breath, nausea,, vomiting, diarrhea Past Medical, Surgical, Social, and Family History Reviewed & Updated per EMR.   Objective:  BP 100/60 mmHg  Pulse 97  Temp(Src) 98.6 F (37 C) (Oral)  Ht 5\' 10"  (1.778 m)  Wt 182 lb (82.555 kg)  BMI 26.11 kg/m2  SpO2 98% Vitals and nursing note reviewed  General: NAD Cardiac: RRR,  Respiratory: CTAB, normal effort Extremities:  Two semi lunar wounds parallel to each other over the dorsum  Of right hand , largest  Approximately 3 cm and length smaller one approximately 1/2 cm in length.   Smaller wound well approximated and healing. Larger wound with 1 single interrupted suture and good granulation  Tissue forming,  No erythema, or swelling. Good cap refill. Sensory intact Neuro: alert and oriented, no focal deficits   Assessment & Plan:    Dog bite  Well-healing dogbite with no signs of infection. Will continue Augmentin to complete 14 day course. Will return in one week to remove suture.  Return precautions discussed     Lilyanna Lunt A. Lincoln Brigham MD, [place] [facility] [facility] Resident PGY-2 Pager [phone]

## 2015-03-22 DIAGNOSIS — I10 Essential (primary) hypertension: Secondary | ICD-10-CM | POA: Diagnosis not present

## 2015-03-23 DIAGNOSIS — I10 Essential (primary) hypertension: Secondary | ICD-10-CM | POA: Diagnosis not present

## 2015-03-24 DIAGNOSIS — I10 Essential (primary) hypertension: Secondary | ICD-10-CM | POA: Diagnosis not present

## 2015-03-25 DIAGNOSIS — I10 Essential (primary) hypertension: Secondary | ICD-10-CM | POA: Diagnosis not present

## 2015-03-26 DIAGNOSIS — I10 Essential (primary) hypertension: Secondary | ICD-10-CM | POA: Diagnosis not present

## 2015-03-27 DIAGNOSIS — I10 Essential (primary) hypertension: Secondary | ICD-10-CM | POA: Diagnosis not present

## 2015-03-28 ENCOUNTER — Ambulatory Visit (INDEPENDENT_AMBULATORY_CARE_PROVIDER_SITE_OTHER): Payer: Medicare Other | Admitting: *Deleted

## 2015-03-28 DIAGNOSIS — I10 Essential (primary) hypertension: Secondary | ICD-10-CM | POA: Diagnosis not present

## 2015-03-28 DIAGNOSIS — T148 Other injury of unspecified body region: Secondary | ICD-10-CM

## 2015-03-28 DIAGNOSIS — W540XXA Bitten by dog, initial encounter: Secondary | ICD-10-CM

## 2015-03-28 NOTE — Progress Notes (Signed)
Patient in today for suture removal for dog bit on dorsal aspect of right hand, wound still slightly open but without signs of infection (redness, erythema, drainage). Dr. Lindell Noe looked at wound and recommended sterile strips be applied after suture removal. One suture removed and two sterile strips applied to site, wound then covered with gauze dressing. Return precautions discussed with patient, no other concerns noted.

## 2015-03-29 DIAGNOSIS — I10 Essential (primary) hypertension: Secondary | ICD-10-CM | POA: Diagnosis not present

## 2015-03-30 DIAGNOSIS — I10 Essential (primary) hypertension: Secondary | ICD-10-CM | POA: Diagnosis not present

## 2015-03-31 DIAGNOSIS — I10 Essential (primary) hypertension: Secondary | ICD-10-CM | POA: Diagnosis not present

## 2015-04-01 ENCOUNTER — Ambulatory Visit: Payer: Medicare Other | Admitting: Student

## 2015-04-01 DIAGNOSIS — I10 Essential (primary) hypertension: Secondary | ICD-10-CM | POA: Diagnosis not present

## 2015-04-02 DIAGNOSIS — I10 Essential (primary) hypertension: Secondary | ICD-10-CM | POA: Diagnosis not present

## 2015-04-03 DIAGNOSIS — I10 Essential (primary) hypertension: Secondary | ICD-10-CM | POA: Diagnosis not present

## 2015-04-04 DIAGNOSIS — I10 Essential (primary) hypertension: Secondary | ICD-10-CM | POA: Diagnosis not present

## 2015-04-05 DIAGNOSIS — I10 Essential (primary) hypertension: Secondary | ICD-10-CM | POA: Diagnosis not present

## 2015-04-06 DIAGNOSIS — I10 Essential (primary) hypertension: Secondary | ICD-10-CM | POA: Diagnosis not present

## 2015-04-07 DIAGNOSIS — I10 Essential (primary) hypertension: Secondary | ICD-10-CM | POA: Diagnosis not present

## 2015-04-08 DIAGNOSIS — I10 Essential (primary) hypertension: Secondary | ICD-10-CM | POA: Diagnosis not present

## 2015-04-09 DIAGNOSIS — I10 Essential (primary) hypertension: Secondary | ICD-10-CM | POA: Diagnosis not present

## 2015-04-10 DIAGNOSIS — I10 Essential (primary) hypertension: Secondary | ICD-10-CM | POA: Diagnosis not present

## 2015-04-15 ENCOUNTER — Encounter (HOSPITAL_COMMUNITY): Payer: Self-pay | Admitting: Emergency Medicine

## 2015-04-15 ENCOUNTER — Emergency Department (HOSPITAL_COMMUNITY): Payer: No Typology Code available for payment source

## 2015-04-15 ENCOUNTER — Emergency Department (HOSPITAL_COMMUNITY)
Admission: EM | Admit: 2015-04-15 | Discharge: 2015-04-15 | Disposition: A | Discharge status: Home / Self Care | Payer: No Typology Code available for payment source | Attending: Emergency Medicine | Admitting: Emergency Medicine

## 2015-04-15 DIAGNOSIS — Z8546 Personal history of malignant neoplasm of prostate: Secondary | ICD-10-CM | POA: Insufficient documentation

## 2015-04-15 DIAGNOSIS — S199XXA Unspecified injury of neck, initial encounter: Secondary | ICD-10-CM | POA: Insufficient documentation

## 2015-04-15 DIAGNOSIS — M19042 Primary osteoarthritis, left hand: Secondary | ICD-10-CM | POA: Diagnosis not present

## 2015-04-15 DIAGNOSIS — J45909 Unspecified asthma, uncomplicated: Secondary | ICD-10-CM | POA: Insufficient documentation

## 2015-04-15 DIAGNOSIS — Y9389 Activity, other specified: Secondary | ICD-10-CM | POA: Diagnosis not present

## 2015-04-15 DIAGNOSIS — Y998 Other external cause status: Secondary | ICD-10-CM | POA: Diagnosis not present

## 2015-04-15 DIAGNOSIS — Z8659 Personal history of other mental and behavioral disorders: Secondary | ICD-10-CM | POA: Diagnosis not present

## 2015-04-15 DIAGNOSIS — Y92410 Unspecified street and highway as the place of occurrence of the external cause: Secondary | ICD-10-CM | POA: Diagnosis not present

## 2015-04-15 DIAGNOSIS — T148 Other injury of unspecified body region: Secondary | ICD-10-CM | POA: Diagnosis not present

## 2015-04-15 DIAGNOSIS — Z8669 Personal history of other diseases of the nervous system and sense organs: Secondary | ICD-10-CM | POA: Insufficient documentation

## 2015-04-15 DIAGNOSIS — Z79899 Other long term (current) drug therapy: Secondary | ICD-10-CM | POA: Insufficient documentation

## 2015-04-15 DIAGNOSIS — R011 Cardiac murmur, unspecified: Secondary | ICD-10-CM | POA: Insufficient documentation

## 2015-04-15 DIAGNOSIS — S3992XA Unspecified injury of lower back, initial encounter: Secondary | ICD-10-CM | POA: Insufficient documentation

## 2015-04-15 DIAGNOSIS — K219 Gastro-esophageal reflux disease without esophagitis: Secondary | ICD-10-CM | POA: Diagnosis not present

## 2015-04-15 DIAGNOSIS — I10 Essential (primary) hypertension: Secondary | ICD-10-CM | POA: Diagnosis not present

## 2015-04-15 DIAGNOSIS — M19041 Primary osteoarthritis, right hand: Secondary | ICD-10-CM | POA: Diagnosis not present

## 2015-04-15 DIAGNOSIS — M542 Cervicalgia: Secondary | ICD-10-CM | POA: Diagnosis not present

## 2015-04-15 DIAGNOSIS — M170 Bilateral primary osteoarthritis of knee: Secondary | ICD-10-CM | POA: Diagnosis not present

## 2015-04-15 DIAGNOSIS — M545 Low back pain, unspecified: Secondary | ICD-10-CM

## 2015-04-15 DIAGNOSIS — Z87891 Personal history of nicotine dependence: Secondary | ICD-10-CM | POA: Diagnosis not present

## 2015-04-15 MED ORDER — OXYCODONE HCL 5 MG PO TABS
2.5000 mg | ORAL_TABLET | Freq: Once | ORAL | Status: AC
Start: 2015-04-15 — End: 2015-04-15
  Administered 2015-04-15: 2.5 mg via ORAL
  Filled 2015-04-15: qty 1

## 2015-04-15 MED ORDER — ACETAMINOPHEN 500 MG PO TABS
1000.0000 mg | ORAL_TABLET | Freq: Once | ORAL | Status: AC
  Administered 2015-04-15: 1000 mg via ORAL
  Filled 2015-04-15: qty 2
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
## 2015-04-15 NOTE — ED Notes (Signed)
Patient transported to X-ray 

## 2015-04-15 NOTE — Discharge Instructions (Signed)

## 2015-04-18 DIAGNOSIS — I10 Essential (primary) hypertension: Secondary | ICD-10-CM | POA: Diagnosis not present

## 2015-04-19 ENCOUNTER — Ambulatory Visit (INDEPENDENT_AMBULATORY_CARE_PROVIDER_SITE_OTHER): Payer: Medicare Other | Admitting: Obstetrics and Gynecology

## 2015-04-19 ENCOUNTER — Encounter: Payer: Self-pay | Admitting: Obstetrics and Gynecology

## 2015-04-19 VITALS — BP 131/78 | HR 108 | Temp 98.1°F | Wt 180.0 lb

## 2015-04-19 DIAGNOSIS — I10 Essential (primary) hypertension: Secondary | ICD-10-CM | POA: Diagnosis not present

## 2015-04-19 DIAGNOSIS — M545 Low back pain: Secondary | ICD-10-CM | POA: Diagnosis not present

## 2015-04-19 MED ORDER — OXYCODONE-ACETAMINOPHEN 10-325 MG PO TABS: 1.0000 | ORAL_TABLET | Freq: Two times a day (BID) | ORAL | Status: DC

## 2015-04-19 MED ORDER — BACLOFEN 10 MG PO TABS: 10.0000 mg | ORAL_TABLET | Freq: Three times a day (TID) | ORAL | Status: DC
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
## 2015-04-19 NOTE — Patient Instructions (Addendum)
   No need for referral to Chiropractor. Recommend calling the office of Dr. Teryl Lucy at [facility] [phone].  Short course of muscle relaxant given  Refilled pain medication  Follow-up with PCP   Back Pain, Adult Back pain is very common. The pain often gets better over time. The cause of back pain is usually not dangerous. Most people can learn to manage their back pain on their own.  HOME CARE  Watch your back pain for any changes. The following actions may help to lessen any pain you are feeling:  Stay active. Start with short walks on flat ground if you can. Try to walk farther each day.  Exercise regularly as told by your doctor. Exercise helps your back heal faster. It also helps avoid future injury by keeping your muscles strong and flexible.  Do not sit, drive, or stand in one place for more than 30 minutes.  Do not stay in bed. Resting more than 1-2 days can slow down your recovery.  Be careful when you bend or lift an object. Use good form when lifting:  Bend at your knees.  Keep the object close to your body.  Do not twist.  Sleep on a firm mattress. Lie on your side, and bend your knees. If you lie on your back, put a pillow under your knees.  Take medicines only as told by your doctor.  Put ice on the injured area.  Put ice in a plastic bag.  Place a towel between your skin and the bag.  Leave the ice on for 20 minutes, 2-3 times a day for the first 2-3 days. After that, you can switch between ice and heat packs.  Avoid feeling anxious or stressed. Find good ways to deal with stress, such as exercise.  Maintain a healthy weight. Extra weight puts stress on your back. GET HELP IF:   You have pain that does not go away with rest or medicine.  You have worsening pain that goes down into your legs or buttocks.  You have pain that does not get better in one week.  You have pain at night.  You lose weight.  You have a fever or  chills. GET HELP RIGHT AWAY IF:   You cannot control when you poop (bowel movement) or pee (urinate).  Your arms or legs feel weak.  Your arms or legs lose feeling (numbness).  You feel sick to your stomach (nauseous) or throw up (vomit).  You have belly (abdominal) pain.  You feel like you may pass out (faint).   This information is not intended to replace advice given to you by your health care provider. Make sure you discuss any questions you have with your health care provider.   Document Released: 07/18/2007 Document Revised: 02/19/2014 Document Reviewed: 06/02/2013 Elsevier Interactive Patient Education [employer].

## 2015-04-20 DIAGNOSIS — I10 Essential (primary) hypertension: Secondary | ICD-10-CM | POA: Diagnosis not present

## 2015-04-21 DIAGNOSIS — I10 Essential (primary) hypertension: Secondary | ICD-10-CM | POA: Diagnosis not present

## 2015-04-22 DIAGNOSIS — I10 Essential (primary) hypertension: Secondary | ICD-10-CM | POA: Diagnosis not present

## 2015-04-23 DIAGNOSIS — I10 Essential (primary) hypertension: Secondary | ICD-10-CM | POA: Diagnosis not present

## 2015-04-24 DIAGNOSIS — I10 Essential (primary) hypertension: Secondary | ICD-10-CM | POA: Diagnosis not present

## 2015-06-16 ENCOUNTER — Other Ambulatory Visit: Payer: Self-pay | Admitting: *Deleted

## 2015-06-16 DIAGNOSIS — M545 Low back pain: Secondary | ICD-10-CM

## 2015-06-16 MED ORDER — HYDROCODONE-ACETAMINOPHEN 5-325 MG PO TABS: 1.0000 | ORAL_TABLET | Freq: Four times a day (QID) | ORAL | Status: DC | PRN

## 2015-06-16 NOTE — Telephone Encounter (Signed)
Yes, will prescribe percocet # 14 to last until his next appointment

## 2015-06-16 NOTE — Addendum Note (Signed)
Addended by: Marina Goodell A on: 06/16/2015 02:10 PM   Modules accepted: Orders

## 2015-06-16 NOTE — Telephone Encounter (Signed)
Patient is aware of script. Jazmin Hartsell,CMA  

## 2015-06-16 NOTE — Telephone Encounter (Signed)
Patient called today stating he needed an appt to get a refill on his pain medication.  Patient has been scheduled for 06/29/15 since this was the provider's next available.  Patient states that he will be out after today and would like to know if he can get enough medication to last until his appt.  Will forward to MD. Joel Donaldson

## 2015-06-29 ENCOUNTER — Ambulatory Visit (INDEPENDENT_AMBULATORY_CARE_PROVIDER_SITE_OTHER): Payer: Medicare Other | Admitting: Student

## 2015-06-29 ENCOUNTER — Encounter: Payer: Self-pay | Admitting: Student

## 2015-06-29 VITALS — BP 177/107 | HR 100 | Temp 98.4°F | Ht 70.0 in | Wt 185.0 lb

## 2015-06-29 DIAGNOSIS — Z7189 Other specified counseling: Secondary | ICD-10-CM

## 2015-06-29 DIAGNOSIS — G8929 Other chronic pain: Secondary | ICD-10-CM

## 2015-06-29 DIAGNOSIS — M545 Low back pain: Secondary | ICD-10-CM

## 2015-06-29 MED ORDER — OXYCODONE-ACETAMINOPHEN 10-325 MG PO TABS: 1.0000 | ORAL_TABLET | Freq: Two times a day (BID) | ORAL | Status: DC
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
## 2015-07-08 ENCOUNTER — Ambulatory Visit: Payer: Medicare Other | Admitting: Student

## 2015-08-02 ENCOUNTER — Encounter: Payer: Self-pay | Admitting: Student

## 2015-08-02 ENCOUNTER — Ambulatory Visit (INDEPENDENT_AMBULATORY_CARE_PROVIDER_SITE_OTHER): Payer: Medicare Other | Admitting: Student

## 2015-08-02 VITALS — BP 140/81 | HR 107 | Temp 98.1°F | Ht 70.0 in | Wt 185.0 lb

## 2015-08-02 DIAGNOSIS — Z7189 Other specified counseling: Secondary | ICD-10-CM

## 2015-08-02 DIAGNOSIS — G252 Other specified forms of tremor: Secondary | ICD-10-CM

## 2015-08-02 DIAGNOSIS — R258 Other abnormal involuntary movements: Secondary | ICD-10-CM | POA: Diagnosis not present

## 2015-08-02 DIAGNOSIS — R259 Unspecified abnormal involuntary movements: Secondary | ICD-10-CM

## 2015-08-02 DIAGNOSIS — M545 Low back pain: Secondary | ICD-10-CM | POA: Diagnosis not present

## 2015-08-02 DIAGNOSIS — Z0000 Encounter for general adult medical examination without abnormal findings: Secondary | ICD-10-CM | POA: Diagnosis not present

## 2015-08-02 DIAGNOSIS — G8929 Other chronic pain: Secondary | ICD-10-CM

## 2015-08-02 MED ORDER — OXYCODONE-ACETAMINOPHEN 10-325 MG PO TABS: 1.0000 | ORAL_TABLET | Freq: Three times a day (TID) | ORAL | Status: DC | PRN

## 2015-08-02 MED ORDER — OXYCODONE-ACETAMINOPHEN 10-325 MG PO TABS: 1.0000 | ORAL_TABLET | Freq: Two times a day (BID) | ORAL | Status: DC

## 2015-08-02 NOTE — Patient Instructions (Signed)
Follow up in 2 months If you feel your pain is worsening or you feel your tremor is getting worse, you should come in earlier Call the office with questions or concerns

## 2015-08-03 NOTE — Assessment & Plan Note (Signed)
Consider screening CT chest given long history of smoking - follow up on his obtaining colonoscopy and Zostavax

## 2015-08-03 NOTE — Assessment & Plan Note (Signed)
Percocet #60 refilled, with another script for the following month - will consider outpatient physical therapy to help with chronic pain at next visit - follow up in 2 months

## 2015-08-03 NOTE — Assessment & Plan Note (Signed)
As previously noted, concern for early parkinsons disease. He does not wish to start treatment for his tremor at this time. He does not display any other symptoms of parkinsons - Will continue to monitor and treat as needed

## 2015-08-03 NOTE — Progress Notes (Signed)
   Subjective:    Patient ID: Joel Donaldson, male    DOB: 04/12/1940, 75 y.o.   MRN: [id number]   CC: med refill, hand tremors worsening  HPI:  75 y/o M with DJD presents for refill of chronic pain meds and is concerned for worsening hand tremors  DJD - stable knee and should pain - feels it is helped with percocet - denies weakness - does use a cane occasionally but does not have one today - denies recent falls - Hand tremor - reports baseline tremor of his his hands at rest but has noted occasional worsening in the setting of pain, or fatigue - feels it is worse with activity - he reports he can still do his normal activities with minimal interruption  Smoking status reviewed - quit 11 years ago  Review of Systems Per HPI else denies SOB, chest pain, recent illness    Objective:  BP 140/81 mmHg  Pulse 107  Temp(Src) 98.1 F (36.7 C) (Oral)  Ht 5\' 10"  (1.778 m)  Wt 185 lb (83.915 kg)  BMI 26.54 kg/m2 Vitals and nursing note reviewed  General: NAD Cardiac: RRR Respiratory: CTAB, normal effort Extremities: tendernes to palpation of  bilateral knees. Bilateral hand fine tremor ( not pill rolling).Tremor improved with finger nose test. No cogwheel rigidity Skin: warm and dry, no rashes noted Neuro: alert and oriented, no focal deficits   Assessment & Plan:    Resting tremor As previously noted, concern for early parkinsons disease. He does not wish to start treatment for his tremor at this time. He does not display any other symptoms of parkinsons - Will continue to monitor and treat as needed  Health care maintenance Consider screening CT chest given long history of smoking - follow up on his obtaining colonoscopy and Zostavax   Encounter for chronic pain management Percocet #60 refilled, with another script for the following month - will consider outpatient physical therapy to help with chronic pain at next visit - follow up in 2 months     Joel Donaldson  A. Joel Brigham MD, [facility] Resident PGY-2 Pager [phone]

## 2015-09-19 ENCOUNTER — Encounter: Payer: Self-pay | Admitting: Family Medicine

## 2015-09-19 ENCOUNTER — Ambulatory Visit (INDEPENDENT_AMBULATORY_CARE_PROVIDER_SITE_OTHER): Payer: Medicare Other | Admitting: Family Medicine

## 2015-09-19 DIAGNOSIS — M545 Low back pain, unspecified: Secondary | ICD-10-CM

## 2015-09-19 DIAGNOSIS — M549 Dorsalgia, unspecified: Secondary | ICD-10-CM | POA: Insufficient documentation

## 2015-09-19 MED ORDER — TIZANIDINE HCL 2 MG PO TABS: 2.0000 mg | ORAL_TABLET | Freq: Three times a day (TID) | ORAL | 0 refills | Status: DC | PRN

## 2015-09-19 NOTE — Progress Notes (Signed)
    Subjective: CC: back pain  HPI: Patient is a 75 y.o. male with a past medical history of strep A presenting to clinic today for back pain.  Has had chronic back pain but noted it flared it a week ago. Pain is in the lumbar region on the right side.  He notes the pain is "sore". Standing or sitting for a long time makes it worse.  No radiation of pain. No bowel/urinary incontinence. No unilateral weakness but he notes he's limited by knee pain.   He's been taking percocet for the pain. He's been taking Advil as well which doesn't help.   Social History: former smoker  ROS: All other systems reviewed and are negative.  Past Medical History Patient Active Problem List   Diagnosis Date Noted  . Back pain 09/19/2015  . Dog bite 03/21/2015  . Chronic pain 12/15/2014  . Syncope 12/07/2014  . Encounter for chronic pain management 08/23/2014  . Health care maintenance 05/20/2014  . Unspecified vitamin D deficiency 07/07/2013  . Resting tremor 11/10/2012  . Rheumatoid arthritis ([place]) 10/10/2012  . Degenerative joint disease 02/15/2012  . Tendinopathy of rotator cuff 01/20/2012  . Preventative health care 12/03/2011  . Osteoarthritis of both knees 07/02/2011  . Alcohol use ([place]) 05/01/2010  . INTENTION TREMOR 09/09/2008  . Prostate cancer ([place]) 07/01/2008  . HYPERLIPIDEMIA 06/24/2007  . COPD 05/12/2007  . MIGRAINE, UNSPEC., W/O INTRACTABLE MIGRAINE 04/11/2006  . HYPERTENSION, BENIGN SYSTEMIC 04/11/2006  . GASTROESOPHAGEAL REFLUX, NO ESOPHAGITIS 04/11/2006    Medications- reviewed and updated  Objective: Office vital signs reviewed. BP 110/70   Pulse (!) 111   Temp 98.8 F (37.1 C) (Oral)   Ht 5\' 10"  (1.778 m)   Wt 188 lb 6.4 oz (85.5 kg)   BMI 27.03 kg/m    Physical Examination:  General: Awake, alert, well- nourished, NAD, pleasant Back: Decreased extension, flexion, and side bends. Tenderness, spacticity, and 2 knots noted over the R para spinal muscles. Negative  SLR. 5/5 strength in the LEs bilaterally. Sensation intact in the LE grossly.    After obtaining consent the area was cleaned with alcohol.  2 injections of 1.5cc of lidocaine 1% without epinephrine total was given by Kathrine Cords into trigger points of the right lumbar paraspinal muscles using a 25 gauge needle. Hemostasis achieved.   Assessment/Plan: Back pain Pt with spasms and knots noted on exam. Trigger point injections performed today in clinic, pt tolerated this well.  - Rx for Zanaflex - dicussed using heat - pt should have enough percocet to last him through 8/20, I advised I could not refill his medications as he has a pain contract. He asked that I contact Dr. Lincoln Brigham, his PCP, about this. - return precautions discussed.    No orders of the defined types were placed in this encounter.   Meds ordered this encounter  Medications  . tiZANidine (ZANAFLEX) 2 MG tablet    Sig: Take 1 tablet (2 mg total) by mouth every 8 (eight) hours as needed for muscle spasms (or lower back pain).    Dispense:  30 tablet    Refill:  [place] PGY-3, [place]

## 2015-09-19 NOTE — Assessment & Plan Note (Signed)
Pt with spasms and knots noted on exam. Trigger point injections performed today in clinic, pt tolerated this well.  - Rx for Zanaflex - dicussed using heat - pt should have enough percocet to last him through 8/20, I advised I could not refill his medications as he has a pain contract. He asked that I contact Dr. Lincoln Brigham, his PCP, about this. - return precautions discussed.

## 2015-09-19 NOTE — Patient Instructions (Signed)
I have prescribed Zanaflex for your lower back pain, You had trigger point injections today in clinic.    Trigger Point Injection Trigger points are areas where you have muscle pain. A trigger point injection is a shot given in the trigger point to relieve that pain. A trigger point might feel like a knot in your muscle. It hurts to press on a trigger point. Sometimes the pain spreads out (radiates) to other parts of the body. For example, pressing on a trigger point in your shoulder might cause pain in your arm or neck. You might have one trigger point. Or, you might have more than one. People often have trigger points in their upper back and lower back. They also occur often in the neck and shoulders. Pain from a trigger point lasts for a long time. It can make it hard to keep moving. You might not be able to do the exercise or physical therapy that could help you deal with the pain. A trigger point injection may help. It does not work for everyone. But, it may relieve your pain for a few days or a few months. A trigger point injection does not cure long-lasting (chronic) pain. LET YOUR CAREGIVER KNOW ABOUT:  Any allergies (especially to latex, lidocaine, or steroids).  Blood-thinning medicines that you take. These drugs can lead to bleeding or bruising after an injection. They include:  Aspirin.  Ibuprofen.  Clopidogrel.  Warfarin.  Other medicines you take. This includes all vitamins, herbs, eyedrops, over-the-counter medicines, and creams.  Use of steroids.  Recent infections.  Past problems with numbing medicines.  Bleeding problems.  Surgeries you have had.  Other health problems. RISKS AND COMPLICATIONS A trigger point injection is a safe treatment. However, problems may develop, such as:  Minor side effects usually go away in 1 to 2 days. These may include:  Soreness.  Bruising.  Stiffness.  More serious problems are rare. But, they may include:  Bleeding under  the skin (hematoma).  Skin infection.  Breaking off of the needle under your skin.  Lung puncture.  The trigger point injection may not work for you. BEFORE THE PROCEDURE You may need to stop taking any medicine that thins your blood. This is to prevent bleeding and bruising. Usually these medicines are stopped several days before the injection. No other preparation is needed. PROCEDURE  A trigger point injection can be given in your caregiver's office or in a clinic. Each injection takes 2 minutes or less.  Your caregiver will feel for trigger points. The caregiver may use a marker to circle the area for the injection.  The skin over the trigger point will be washed with a germ-killing (antiseptic) solution.  The caregiver pinches the spot for the injection.  Then, a very thin needle is used for the shot. You may feel pain or a twitching feeling when the needle enters the trigger point.  A numbing solution may be injected into the trigger point. Sometimes a drug to keep down swelling, redness, and warmth (inflammation) is also injected.  Your caregiver moves the needle around the trigger zone until the tightness and twitching goes away.  After the injection, your caregiver may put gentle pressure over the injection site.  Then it is covered with a bandage. AFTER THE PROCEDURE  You can go right home after the injection.  The bandage can be taken off after a few hours.  You may feel sore and stiff for 1 to 2 days.  Go back  to your regular activities slowly. Your caregiver may ask you to stretch your muscles. Do not do anything that takes extra energy for a few days.  Follow your caregiver's instructions to manage and treat other pain.   This information is not intended to replace advice given to you by your health care provider. Make sure you discuss any questions you have with your health care provider.   Document Released: 01/18/2011 Document Revised: 05/26/2012 Document  Reviewed: 01/18/2011 Elsevier Interactive Patient Education [employer].

## 2015-09-20 ENCOUNTER — Telehealth: Payer: Self-pay | Admitting: Student

## 2015-09-20 NOTE — Telephone Encounter (Signed)
Patient called regarding recent request for more pain medication. He did not answer so a message was left asking him to call with questions regarding percocet. He was given Perocet x60 tabs with 1 refill on 6/20. This was to last until 8/20. It is concerning if he is already out and may need further evaluation of pain. At this time he cannot have more narcotic pain medications without being first evaluated in clinic. He nest has ann appt with me on 8/21 but may need to be seen sooner

## 2015-10-03 ENCOUNTER — Ambulatory Visit (INDEPENDENT_AMBULATORY_CARE_PROVIDER_SITE_OTHER): Payer: Medicare Other | Admitting: Student

## 2015-10-03 DIAGNOSIS — Z7189 Other specified counseling: Secondary | ICD-10-CM | POA: Diagnosis not present

## 2015-10-03 DIAGNOSIS — G8929 Other chronic pain: Secondary | ICD-10-CM

## 2015-10-03 DIAGNOSIS — M545 Low back pain: Secondary | ICD-10-CM | POA: Diagnosis not present

## 2015-10-03 MED ORDER — OXYCODONE-ACETAMINOPHEN 10-325 MG PO TABS: 1.0000 | ORAL_TABLET | Freq: Three times a day (TID) | ORAL | 0 refills | Status: DC | PRN

## 2015-10-03 MED ORDER — OXYCODONE-ACETAMINOPHEN 10-325 MG PO TABS: 1.0000 | ORAL_TABLET | Freq: Two times a day (BID) | ORAL | 0 refills | Status: DC

## 2015-10-03 NOTE — Progress Notes (Signed)
   Subjective:    Patient ID: Joel Donaldson, male    DOB: 07-23-1940, 75 y.o.   MRN: [id number]   CC: Med refill  HPI: 75 y/o with PMGh sig for DJD and chronic bilateral knee and back pain  Knee and Back pain - Has been taking percocet BID - feels it is the only thing that helps and with it, he is able to be more functional - at last visit he had a steroid injection in his back and feels " it did nothing" for is pain -  Denies recent falls or oversedation with the percocet  - still states he does not like tramadol and cannot tolerate it  Smoking status reviewed  Review of Systems  Per HPI, else denies recent illness, fever, headache, changes in vision, chest pain, shortness of breath, abdominal pain, N/V/D, new weakness    Objective:  Vitals and nursing note reviewed  General: NAD Cardiac: RRR,  Respiratory: CTAB, normal effort Extremities: bilateral knee tenderness Skin: warm and dry, no rashes noted Neuro: alert and oriented,    Assessment & Plan:    Encounter for chronic pain management Percocet 5/325 # 60 per month for the next two months refilled - will again try PT to help with mobility, referral made - tylenol to supp pain medications - as percocet does help function given significant arthritis, will continue for now - continue to address risks of narcotic use with the patient     Sarahgrace Broman A. Lincoln Brigham MD, [facility] Resident PGY-3 Pager [phone]

## 2015-10-03 NOTE — Patient Instructions (Signed)
Follow-up in 2 months for pain management Referral for physical therapy was made today. You should hear from them in the next 1-2 weeks If questions or concerns call the office at [phone]

## 2015-10-04 NOTE — Assessment & Plan Note (Signed)
Percocet 5/325 # 60 per month for the next two months refilled - will again try PT to help with mobility, referral made - tylenol to supp pain medications - as percocet does help function given significant arthritis, will continue for now - continue to address risks of narcotic use with the patient

## 2015-10-11 ENCOUNTER — Ambulatory Visit: Payer: Medicare Other | Admitting: Physical Therapy

## 2015-10-18 ENCOUNTER — Ambulatory Visit: Payer: Medicare Other | Attending: Family Medicine

## 2015-10-18 DIAGNOSIS — M545 Low back pain: Secondary | ICD-10-CM

## 2015-10-18 DIAGNOSIS — M2560 Stiffness of unspecified joint, not elsewhere classified: Secondary | ICD-10-CM | POA: Insufficient documentation

## 2015-10-18 DIAGNOSIS — M62830 Muscle spasm of back: Secondary | ICD-10-CM | POA: Insufficient documentation

## 2015-10-18 DIAGNOSIS — R293 Abnormal posture: Secondary | ICD-10-CM | POA: Diagnosis not present
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
## 2015-10-20 ENCOUNTER — Ambulatory Visit: Payer: Medicare Other | Admitting: Physical Therapy

## 2015-10-20 DIAGNOSIS — M545 Low back pain: Secondary | ICD-10-CM | POA: Diagnosis not present

## 2015-10-20 DIAGNOSIS — M62830 Muscle spasm of back: Secondary | ICD-10-CM | POA: Diagnosis not present

## 2015-10-20 DIAGNOSIS — M2560 Stiffness of unspecified joint, not elsewhere classified: Secondary | ICD-10-CM

## 2015-10-20 DIAGNOSIS — R293 Abnormal posture: Secondary | ICD-10-CM | POA: Diagnosis not present

## 2015-10-20 NOTE — Patient Instructions (Signed)
Hamstring Step 1    Straighten left knee. Keep knee level with other knee or on bolster. Hold 10 to 30 seconds___ seconds. Relax knee by returning foot to start. Repeat ___ times. 3PELVIC TILT: Posterior    Tighten abdominals, flatten low back. __10_ reps per set, _1__ sets per day.Bridge    Lie back, legs bent. Inhale, pressing hips up. Exhale lowering hips Repeat _5-10___ times. Do __1-2__ sessions per day.  [email or web address]   Copyright  VHI. All rights reserved.     Copyright  VHI. All rights reserved.   Copyright  VHI. All rights reserved.

## 2015-10-20 NOTE — Therapy (Signed)
[place], [place], [address] Phone: [phone]   Fax:  [phone]  Physical Therapy Treatment  Patient Details  Name: Joel Donaldson MRN: [id number] Date of Birth: 1940-11-29 Referring Provider: Howell Pringle, MD, Karrie Doffing  Encounter Date: 10/20/2015      PT End of Session - 10/20/15 1631    Visit Number 2   Number of Visits 12   Date for PT Re-Evaluation 11/29/15   PT Start Time 0973   PT Stop Time 1645   PT Time Calculation (min) 60 min   Activity Tolerance Patient tolerated treatment well   Behavior During Therapy [facility] for tasks assessed/performed      Past Medical History:  Diagnosis Date  . Anxiety   . Arthritis    hands, back, knees  . Asthma   . Chest pain at rest 12/19/2011  . Depression   . GERD (gastroesophageal reflux disease)   . Glaucoma   . Heart murmur asymptomatic  . Hx of radioisotope therapy 07/26/11   Radiosactive Prostate Seed Implant/ I-125 Seeds  . Hypertension   . Nocturia   . Prostate cancer ([place])   . Rheumatoid arthritis ([place]) 10/10/2012  . Short of breath on exertion     Past Surgical History:  Procedure Laterality Date  . CYSTOSCOPY  09/14/2011   Procedure: CYSTOSCOPY FLEXIBLE;  Surgeon: Hanley Ben, MD;  Location: [facility];  Service: Urology;  Laterality: N/A;  No seeds seen in the bladder  . LUMBAR FUSION  1995  . RADIOACTIVE SEED IMPLANT  09/14/2011   Procedure: RADIOACTIVE SEED IMPLANT;  Surgeon: Hanley Ben, MD;  Location: [facility];  Service: Urology;  Laterality: N/A;  Total number of seeds -    . SHOULDER SURGERY  YRS AGO   LEFT    There were no vitals filed for this visit.      Subjective Assessment - 10/20/15 1551    Subjective 6/10 back into legs. better with pain meds.  worse to walk.     Currently in Pain? Yes   Pain Score 6                          [facility] Adult PT  Treatment/Exercise - 10/20/15 0001      Lumbar Exercises: Stretches   Passive Hamstring Stretch 3 reps;30 seconds  HEP   Single Knee to Chest Stretch 3 reps;10 seconds  each   Pelvic Tilt --  10 X 5 seconds HEP   Piriformis Stretch 3 reps;30 seconds     Lumbar Exercises: Supine   Bridge 10 reps   Other Supine Lumbar Exercises ball squeeze 5 seconds X 10 with abdominal.      Moist Heat Therapy   Number Minutes Moist Heat 15 Minutes   Moist Heat Location Lumbar Spine     Manual Therapy   Manual therapy comments instrument assist, sidelying right lower thoracic and gluteals , tissue softened.                 PT Education - 10/20/15 1616    Education provided Yes   Education Details Exercise   Person(s) Educated Patient   Methods Explanation;Demonstration;Tactile cues;Verbal cues;Handout   Comprehension Verbalized understanding;Returned demonstration          PT Short Term Goals - 10/20/15 1634      PT SHORT TERM GOAL #1   Title He will be independent with intia HEP  PT Long Term Goals - 10/18/15 1344      PT LONG TERM GOAL #1   Title He will be independent with al HEP issued   Time 6   Period Weeks   Status New     PT LONG TERM GOAL #2   Title He will report pain decreased 75% or more with normal home activity   Time 6   Period Weeks   Status New     PT LONG TERM GOAL #3   Title He wil report pain as intermittant .    Time 6   Period Weeks   Status New     PT LONG TERM GOAL #4   Title He will report sitting for 45-60 min without incr pain   Time 6   Period Weeks   Status New     PT LONG TERM GOAL #5   Title He will report able to stand 20-30 min without incr pain.   Time 6   Period Weeks   Status New     Additional Long Term Goals   Additional Long Term Goals Yes     PT LONG TERM GOAL #6   Title He will report  return to short bouts of yard work with 5/10 max pain   Time 6   Period Weeks   Status New                Plan - 10/20/15 1632    Clinical Impression Statement Beginning HEP issued today and manual helped decrease pain.  No new goals met, progress toward home exercise goal.    PT Next Visit Plan review exercises,  manual progress toward goals,  check walking technique   PT Home Exercise Plan bridge, knee to chest, poelvic tilt, Hamstring stretch   Consulted and Agree with Plan of Care Patient      Patient will benefit from skilled therapeutic intervention in order to improve the following deficits and impairments:  Pain, Postural dysfunction, Decreased activity tolerance, Decreased strength, Decreased range of motion, Increased muscle spasms  Visit Diagnosis: Low back pain, unspecified back pain laterality, with sciatica presence unspecified  Muscle spasm of back  Abnormal posture  Joint stiffness of spine     Problem List Patient Active Problem List   Diagnosis Date Noted  . Back pain 09/19/2015  . Dog bite 03/21/2015  . Chronic pain 12/15/2014  . Syncope 12/07/2014  . Encounter for chronic pain management 08/23/2014  . Health care maintenance 05/20/2014  . Unspecified vitamin D deficiency 07/07/2013  . Resting tremor 11/10/2012  . Rheumatoid arthritis ([place]) 10/10/2012  . Degenerative joint disease 02/15/2012  . Tendinopathy of rotator cuff 01/20/2012  . Preventative health care 12/03/2011  . Osteoarthritis of both knees 07/02/2011  . Alcohol use ([place]) 05/01/2010  . INTENTION TREMOR 09/09/2008  . Prostate cancer ([place]) 07/01/2008  . HYPERLIPIDEMIA 06/24/2007  . COPD 05/12/2007  . MIGRAINE, UNSPEC., W/O INTRACTABLE MIGRAINE 04/11/2006  . HYPERTENSION, BENIGN SYSTEMIC 04/11/2006  . GASTROESOPHAGEAL REFLUX, NO ESOPHAGITIS 04/11/2006    [facility] 10/20/2015, 4:35 PM  [facility] [address] [place], [place], [address] Phone: [phone]   Fax:  [phone]  Name: Joel Donaldson MRN: [id number] Date of Birth:  1940/08/14   Melvenia Needles, PTA 10/20/15 4:35 PM Phone: [phone] Fax: [phone]

## 2015-10-24 ENCOUNTER — Ambulatory Visit: Payer: Medicare Other

## 2015-10-26 ENCOUNTER — Ambulatory Visit: Payer: Medicare Other | Admitting: Physical Therapy

## 2015-10-26 DIAGNOSIS — R293 Abnormal posture: Secondary | ICD-10-CM

## 2015-10-26 DIAGNOSIS — M62830 Muscle spasm of back: Secondary | ICD-10-CM

## 2015-10-26 DIAGNOSIS — M2560 Stiffness of unspecified joint, not elsewhere classified: Secondary | ICD-10-CM | POA: Diagnosis not present

## 2015-10-26 DIAGNOSIS — M545 Low back pain: Secondary | ICD-10-CM | POA: Diagnosis not present

## 2015-10-26 NOTE — Therapy (Addendum)
[name], [place], [address] Phone: [phone]   Fax:  [phone]  Physical Therapy Treatment/ Discharge  Patient Details  Name: Joel Donaldson MRN: [id number] Date of Birth: Nov 11, 1940 Referring Provider: Howell Pringle, MD, Joel Donaldson  Encounter Date: 10/26/2015      PT End of Session - 10/26/15 0947    Visit Number 3   Number of Visits 12   Date for PT Re-Evaluation 11/29/15   Authorization Type [employer] MCR   PT Start Time 0932   PT Stop Time 1027   PT Time Calculation (min) 55 min      Past Medical History:  Diagnosis Date  . Anxiety   . Arthritis    hands, back, knees  . Asthma   . Chest pain at rest 12/19/2011  . Depression   . GERD (gastroesophageal reflux disease)   . Glaucoma   . Heart murmur asymptomatic  . Hx of radioisotope therapy 07/26/11   Radiosactive Prostate Seed Implant/ I-125 Seeds  . Hypertension   . Nocturia   . Prostate cancer ([place])   . Rheumatoid arthritis ([place]) 10/10/2012  . Short of breath on exertion     Past Surgical History:  Procedure Laterality Date  . CYSTOSCOPY  09/14/2011   Procedure: CYSTOSCOPY FLEXIBLE;  Surgeon: Joel Ben, MD;  Location: [facility];  Service: Urology;  Laterality: N/A;  No seeds seen in the bladder  . LUMBAR FUSION  1995  . RADIOACTIVE SEED IMPLANT  09/14/2011   Procedure: RADIOACTIVE SEED IMPLANT;  Surgeon: Joel Ben, MD;  Location: [facility];  Service: Urology;  Laterality: N/A;  Total number of seeds -    . SHOULDER SURGERY  YRS AGO   LEFT    There were no vitals filed for this visit.      Subjective Assessment - 10/26/15 0946    Currently in Pain? Yes   Pain Score 6    Pain Location Back   Pain Orientation Posterior;Right;Left   Pain Radiating Towards bilateral posterior thighs   Aggravating Factors  wlaking, bending   Pain Relieving Factors medication, heat                          [facility] Adult PT Treatment/Exercise - 10/26/15 0001      Lumbar Exercises: Stretches   Passive Hamstring Stretch 3 reps;30 seconds  HEP   Single Knee to Chest Stretch 3 reps;30 seconds  each   Pelvic Tilt --  10 X 5 seconds MAX CUES    Piriformis Stretch 3 reps;30 seconds     Lumbar Exercises: Aerobic   Stationary Bike Nustep L3 x 5 minutes UE/LE      Lumbar Exercises: Supine   Bridge 10 reps     Acupuncturist Location lower back   Electrical Stimulation Action IFC    Electrical Stimulation Parameters 8   Electrical Stimulation Goals Pain     Manual Therapy   Manual therapy comments soft tissue mobilization to lumbar parasinals right side more tense, bilateral glutes                  PT Short Term Goals - 10/20/15 1634      PT SHORT TERM GOAL #1   Title He will be independent with intia HEP           PT Long Term Goals - 10/18/15 1344  PT LONG TERM GOAL #1   Title He will be independent with al HEP issued   Time 6   Period Weeks   Status New     PT LONG TERM GOAL #2   Title He will report pain decreased 75% or more with normal home activity   Time 6   Period Weeks   Status New     PT LONG TERM GOAL #3   Title He wil report pain as intermittant .    Time 6   Period Weeks   Status New     PT LONG TERM GOAL #4   Title He will report sitting for 45-60 min without incr pain   Time 6   Period Weeks   Status New     PT LONG TERM GOAL #5   Title He will report able to stand 20-30 min without incr pain.   Time 6   Period Weeks   Status New     Additional Long Term Goals   Additional Long Term Goals Yes     PT LONG TERM GOAL #6   Title He will report  return to short bouts of yard work with 5/10 max pain   Time 6   Period Weeks   Status New               Plan - 10/26/15 1019    Clinical Impression Statement Pt reports weather has increased his pain. He  is trying his HEP. His pain is unchnaged however the manual soft tisuue work did help last visit. We reviewed his HEP and repeated manual focusing on lumbar paraspinals. His right side has more tightness. IFC and HMP post for pain. He reported less thigh pain post manual.    PT Next Visit Plan review exercises,  manual to lumbar / modalities progress toward goals,  check walking technique      Patient will benefit from skilled therapeutic intervention in order to improve the following deficits and impairments:  Pain, Postural dysfunction, Decreased activity tolerance, Decreased strength, Decreased range of motion, Increased muscle spasms  Visit Diagnosis: Low back pain, unspecified back pain laterality, with sciatica presence unspecified  Muscle spasm of back  Abnormal posture  Joint stiffness of spine     Problem List Patient Active Problem List   Diagnosis Date Noted  . Back pain 09/19/2015  . Dog bite 03/21/2015  . Chronic pain 12/15/2014  . Syncope 12/07/2014  . Encounter for chronic pain management 08/23/2014  . Health care maintenance 05/20/2014  . Unspecified vitamin D deficiency 07/07/2013  . Resting tremor 11/10/2012  . Rheumatoid arthritis ([place]) 10/10/2012  . Degenerative joint disease 02/15/2012  . Tendinopathy of rotator cuff 01/20/2012  . Preventative health care 12/03/2011  . Osteoarthritis of both knees 07/02/2011  . Alcohol use ([place]) 05/01/2010  . INTENTION TREMOR 09/09/2008  . Prostate cancer ([place]) 07/01/2008  . HYPERLIPIDEMIA 06/24/2007  . COPD 05/12/2007  . MIGRAINE, UNSPEC., W/O INTRACTABLE MIGRAINE 04/11/2006  . HYPERTENSION, BENIGN SYSTEMIC 04/11/2006  . GASTROESOPHAGEAL REFLUX, NO ESOPHAGITIS 04/11/2006    Joel Donaldson 10/26/2015, 10:43 AM  [facility] [address] [place], [place], [address] Phone: [phone]   Fax:  [phone]  Name: Joel Donaldson MRN: [id number] Date of  Birth: [birth year]  PHYSICAL THERAPY DISCHARGE SUMMARY  Visits from Start of Care: 3  Current functional level related to goals / functional outcomes: Unknown as he did not return after 3 rd visit  Remaining deficits: Unknown   Education / Equipment: HEP  Plan:                                                    Patient goals were not met. Patient is being discharged due to not returning since the last visit.  ?????    Joel Donaldson   PT  01/03/16             5:07 PM

## 2015-11-01 ENCOUNTER — Ambulatory Visit: Payer: Medicare Other | Admitting: Physical Therapy

## 2015-11-04 ENCOUNTER — Telehealth: Payer: Self-pay | Admitting: Physical Therapy

## 2015-11-04 ENCOUNTER — Ambulatory Visit: Payer: Medicare Other | Admitting: Physical Therapy

## 2015-11-04 NOTE — Telephone Encounter (Signed)
Spoke to Mr Joel Donaldson regarding two missed appointments. He reports he returned to ask MD if he should continue PT because it seems to be increasing his pain. MD asked him to continue for now and return for follow up. Mr Joel Donaldson plans to attend is future scheduled appointments.

## 2015-11-07 ENCOUNTER — Ambulatory Visit: Payer: Medicare Other | Admitting: Physical Therapy

## 2015-11-09 ENCOUNTER — Ambulatory Visit: Payer: Medicare Other | Admitting: Physical Therapy

## 2015-11-14 ENCOUNTER — Ambulatory Visit: Payer: Medicare Other | Attending: Family Medicine | Admitting: Physical Therapy

## 2015-11-16 ENCOUNTER — Encounter: Payer: Medicare Other | Admitting: Physical Therapy

## 2015-11-17 ENCOUNTER — Ambulatory Visit (INDEPENDENT_AMBULATORY_CARE_PROVIDER_SITE_OTHER): Payer: Medicare Other | Admitting: Family Medicine

## 2015-11-17 ENCOUNTER — Encounter: Payer: Self-pay | Admitting: Family Medicine

## 2015-11-17 DIAGNOSIS — H6983 Other specified disorders of Eustachian tube, bilateral: Secondary | ICD-10-CM

## 2015-11-17 DIAGNOSIS — H6980 Other specified disorders of Eustachian tube, unspecified ear: Secondary | ICD-10-CM | POA: Insufficient documentation

## 2015-11-17 MED ORDER — FLUTICASONE PROPIONATE 50 MCG/ACT NA SUSP: 2.0000 | Freq: Every day | NASAL | 6 refills | Status: DC

## 2015-11-17 NOTE — Progress Notes (Signed)
   Subjective:   Joel Donaldson is a 75 y.o. male with a history of HTN, GERD, migraines, COPD here for ears stopped up  Patient reports the symptoms started last night. He has decreased hearing as if he is in a tunnel. He has only a little bit of pain. He has some nasal congestion but otherwise denies symptoms such as sore throat, chest congestion, cough, fever. He thinks this is happened to him once before in the past. He hasn't tried any medications. Review of Systems:  Per HPI.   Social History: Former smoker  Objective:  BP 120/77 (BP Location: Left Arm, Patient Position: Sitting, Cuff Size: Normal)   Pulse 82   Temp 98 F (36.7 C) (Oral)   Wt 186 lb (84.4 kg)   BMI 26.69 kg/m   Gen:  75 y.o. male in NAD  HEENT: NCAT, MMM, EOMI, PERRL, anicteric sclerae, OP clear, TMs nonerythematous with mild convexity, nasal turbinates boggy Neck: no LAD CV: RRR, no MRG Resp: Non-labored, CTAB, no wheezes noted Neuro: Alert and oriented, speech normal    Assessment & Plan:     Joel Donaldson is a 75 y.o. male here for   Eustachian tube dysfunction Symptoms and exam consistent with eustachian tube dysfunction  this is likely related to the nasal congestion, possibly due to allergic rhinitis Flonase daily Return precautions including symptoms of infection discussed Follow-up when necessary      Virginia Crews, MD MPH PGY-3,  [facility] 11/17/2015  9:28 AM

## 2015-11-17 NOTE — Assessment & Plan Note (Signed)
Symptoms and exam consistent with eustachian tube dysfunction  this is likely related to the nasal congestion, possibly due to allergic rhinitis Flonase daily Return precautions including symptoms of infection discussed Follow-up when necessary

## 2015-11-17 NOTE — Patient Instructions (Signed)

## 2015-11-28 ENCOUNTER — Encounter: Payer: Self-pay | Admitting: Student

## 2015-11-28 ENCOUNTER — Ambulatory Visit (INDEPENDENT_AMBULATORY_CARE_PROVIDER_SITE_OTHER): Payer: Medicare Other | Admitting: Student

## 2015-11-28 VITALS — BP 137/87 | HR 93 | Temp 98.6°F | Ht 70.0 in | Wt 187.4 lb

## 2015-11-28 DIAGNOSIS — G8929 Other chronic pain: Secondary | ICD-10-CM | POA: Diagnosis not present

## 2015-11-28 DIAGNOSIS — E559 Vitamin D deficiency, unspecified: Secondary | ICD-10-CM | POA: Diagnosis not present

## 2015-11-28 DIAGNOSIS — R7989 Other specified abnormal findings of blood chemistry: Secondary | ICD-10-CM

## 2015-11-28 DIAGNOSIS — I10 Essential (primary) hypertension: Secondary | ICD-10-CM | POA: Diagnosis not present

## 2015-11-28 MED ORDER — OXYCODONE-ACETAMINOPHEN 10-325 MG PO TABS: 1.0000 | ORAL_TABLET | Freq: Two times a day (BID) | ORAL | 0 refills | Status: DC

## 2015-11-28 MED ORDER — LISINOPRIL-HYDROCHLOROTHIAZIDE 20-12.5 MG PO TABS: 1.0000 | ORAL_TABLET | Freq: Every day | ORAL | 3 refills | Status: DC

## 2015-11-28 NOTE — Progress Notes (Signed)
   Subjective:    Patient ID: Joel Donaldson, male    DOB: 28-Sep-1940, 75 y.o.   MRN: [id number]   CC: med refill,   HPI: 75 y/o Male with DJD presents for med Percocet refill  Chronic pain- most pronounced in bilateral knees and shoulders - has tried PT which he did not find helpful - denies recent falls, weakness - he feels the percocet helps him to be more functional   Smoking status reviewed  Review of Systems  Per HPI, else denies recent illness, fever, headache, chest pain, shortness of breath, abdominal pain, N/V/D, weakness    Objective:  BP 137/87   Pulse 93   Temp 98.6 F (37 C) (Oral)   Ht 5\' 10"  (1.778 m)   Wt 187 lb 6.4 oz (85 kg)   BMI 26.89 kg/m  Vitals and nursing note reviewed  General: NAD Cardiac: RRR,  Respiratory: CTAB, normal effort Extremities: mild bilateral ttp to bilateral knees and shoulder joints, normal ROM of bilateral knees Skin: warm and dry Neuro: alert and oriented,    Assessment & Plan:    Encounter for chronic pain management [place] drug data base reviewed. No red flags. Percocet 10-325 # 60 refilled. Patient unable to take tramadol due to GI side effects ( nausea/emesis).   Unspecified vitamin D deficiency Patient has been taking Vitamin D 50,000 dailym, not q week.  - Will obtain vitamin D level today - correct administration for this medication - anticipate it will need to be stopped  Osteoarthritis of both knees Percocet 10-325 # 60 refilled today. [place] drug data base reviewed -Will try steroid injections of bilateral knees at next visit. He had this last in 2013, unclear why he did not continue this  HYPERTENSION, BENIGN SYSTEMIC Well controlled. Continue current regimen of lisinopril-hctz - refill provided    Devereaux Grayson A. Lincoln Brigham MD, [facility] Resident PGY-3 Pager [phone]

## 2015-11-28 NOTE — Patient Instructions (Signed)
Follow up in 1 week for knee injections  Please bring your medications at that visit If you have questions or concerns, call the office at [phone]

## 2015-11-29 LAB — VITAMIN D 25 HYDROXY (VIT D DEFICIENCY, FRACTURES): VIT D 25 HYDROXY: 26 ng/mL — AB (ref 30–100)

## 2015-11-29 NOTE — Assessment & Plan Note (Signed)
Well controlled. Continue current regimen of lisinopril-hctz - refill provided

## 2015-11-29 NOTE — Assessment & Plan Note (Signed)
Percocet 10-325 # 60 refilled today. [place] drug data base reviewed -Will try steroid injections of bilateral knees at next visit. He had this last in 2013, unclear why he did not continue this

## 2015-11-29 NOTE — Assessment & Plan Note (Signed)
Patient has been taking Vitamin D 50,000 dailym, not q week.  - Will obtain vitamin D level today - correct administration for this medication - anticipate it will need to be stopped

## 2015-11-29 NOTE — Assessment & Plan Note (Addendum)
[place] drug data base reviewed. No red flags. Percocet 10-325 # 60 refilled. Patient unable to take tramadol due to GI side effects ( nausea/emesis).

## 2015-12-05 ENCOUNTER — Ambulatory Visit (INDEPENDENT_AMBULATORY_CARE_PROVIDER_SITE_OTHER): Payer: Medicare Other | Admitting: Student

## 2015-12-05 VITALS — BP 125/84 | HR 112 | Temp 98.1°F | Wt 187.0 lb

## 2015-12-05 DIAGNOSIS — M170 Bilateral primary osteoarthritis of knee: Secondary | ICD-10-CM

## 2015-12-05 MED ORDER — METHYLPREDNISOLONE ACETATE 40 MG/ML IJ SUSP
40.0000 mg | Freq: Once | INTRAMUSCULAR | Status: AC
  Administered 2015-12-05: 40 mg via INTRAMUSCULAR

## 2015-12-05 NOTE — Patient Instructions (Signed)
Follow up in 2 weeks for shoulder injection You had injection of your knees today If you have worsening swelling, redness, pain of your knees return for evaluation If you hae any questions, call the office at [phone]

## 2015-12-05 NOTE — Addendum Note (Signed)
Addended by: Dorna Bloom on: 12/05/2015 12:05 PM   Modules accepted: Orders

## 2015-12-05 NOTE — Assessment & Plan Note (Addendum)
OA of both shoulders. Continue percocet, tylenol for now - will follow up in 2 weeks for steroid injection of both shoulders

## 2015-12-05 NOTE — Progress Notes (Signed)
   Subjective:    Patient ID: Joel Donaldson, male    DOB: 26-Jul-1940, 75 y.o.   MRN: [id number]   CC: Desire for knee injection  HPI: 75 y/o M with PMH of OA and chronic knee pain presents for knee injection  Knee pain - treated with percocet BID wit tylenol - feels that steroid injection would help his p[ain even further - denies recent falls or trauma to his knees - denies fevers, or knee swelling  Smoking status reviewed  Review of Systems  Per HPI, else denies recent illness, fever, chest pain, shortness of breath,   Objective:  BP 125/84   Pulse (!) 112   Temp 98.1 F (36.7 C) (Oral)   Wt 187 lb (84.8 kg)   SpO2 99%   BMI 26.83 kg/m  Vitals and nursing note reviewed  General: NAD Cardiac: RRR Respiratory: CTAB, normal effortesent Extremities: bilateral knees, without evidence of trauma, no swelling, non tender to palpation Skin: warm and dry, Neuro: alert and oriented,   Assessment & Plan:    Osteoarthritis of both knees Steroid injections performed in bilateral knees. See procedure note - return precautions dicussed    Osteoarthritis of both shoulders OA of both shoulders. Continue percocet, tylenol for now - will follow up in 2 weeks for steroid injection of both shoulders    Julee Stoll A. Lincoln Brigham MD, [facility] Resident PGY-3 Pager [phone]

## 2015-12-05 NOTE — Assessment & Plan Note (Addendum)
Steroid injections performed in bilateral knees. See procedure note - return precautions dicussed

## 2015-12-05 NOTE — Progress Notes (Signed)
INJECTION: Patient was given informed consent, signed copy in the chart. Appropriate time out was taken. Area prepped and draped in usual sterile fashion. 1 cc of methylprednisolone 40 mg/ml plus  4 cc of 1% lidocaine without epinephrine was injected into the left knee using a(n) mediolateral  approach. This was then repeated on the right knee. The patient tolerated the procedure well. There were no complications. Post procedure instructions were given.  Kamber Vignola A. Lincoln Brigham MD, [facility] Resident PGY-3 Pager [phone]

## 2015-12-08 ENCOUNTER — Encounter: Payer: Self-pay | Admitting: Family Medicine

## 2015-12-08 ENCOUNTER — Ambulatory Visit (INDEPENDENT_AMBULATORY_CARE_PROVIDER_SITE_OTHER): Payer: Medicare Other | Admitting: Family Medicine

## 2015-12-08 VITALS — BP 121/74 | HR 100 | Temp 98.2°F | Wt 189.0 lb

## 2015-12-08 DIAGNOSIS — T300 Burn of unspecified body region, unspecified degree: Secondary | ICD-10-CM

## 2015-12-08 NOTE — Progress Notes (Deleted)
Feels sore. Outside. Swelling about the same. Hurts when moving it. No fevers or chills. Pain getting better.

## 2015-12-08 NOTE — Progress Notes (Signed)
    Subjective:  Joel Donaldson is a 75 y.o. male who presents to the [facility] today with a chief complaint of knee pain.   HPI:  Knee Pain Had knee injections three days ago for OA. Has done well since then, though has noticed that his left knee has more pain and redness over the past day or so. Denies any increased swelling. No increased pain. No fevers or chills. No pain with movement. Has not tried any treatments. Mainly concerned that it is infected. The area of redness is localized only to the area where he had cold spray.   ROS: Per HPI  PMH: Smoking history reviewed.    Objective:  Physical Exam: BP 121/74   Pulse 100   Temp 98.2 F (36.8 C) (Oral)   Wt 189 lb (85.7 kg)   SpO2 100%   BMI 27.12 kg/m   Gen: NAD, resting comfortably Pulm: NWOB MSK: - Right knee: No gross deformities. No effusions. No redness. FROM.  - Left knee: 4cm well circumscribed erythematous area on medial aspect of left knee with overlying vesicles. No other gross deformities. No effusions. FROM.  Skin: Lesions as above, otherwise warm and dry.  Neuro: grossly normal, moves all extremities Psych: Normal affect and thought content  Assessment/Plan:  Superficial Aerosol Burn Lesion consistent with mild superficial aerosol burn due to cold spray used with knee injection. No signs of infection. Will treat conservatively. Return precautions reviewed. Follow up as needed.   Algis Greenhouse. Jerline Pain, [place] Resident PGY-3 12/08/2015 9:29 AM

## 2015-12-08 NOTE — Patient Instructions (Signed)
You have a mild burn on your skin from the cold spray. This will heal over the next several days to a couple of weeks.  If your pain is not improving, or if you have severe swelling, fevers, or chills, please let us know.  Take care,  Dr Jerline Pain

## 2015-12-16 ENCOUNTER — Ambulatory Visit (INDEPENDENT_AMBULATORY_CARE_PROVIDER_SITE_OTHER): Payer: Medicare Other | Admitting: Student

## 2015-12-16 VITALS — BP 120/73 | HR 104 | Temp 97.8°F | Ht 70.0 in | Wt 187.0 lb

## 2015-12-16 DIAGNOSIS — M19011 Primary osteoarthritis, right shoulder: Secondary | ICD-10-CM

## 2015-12-16 DIAGNOSIS — M19012 Primary osteoarthritis, left shoulder: Secondary | ICD-10-CM | POA: Diagnosis not present

## 2015-12-16 MED ORDER — OXYCODONE-ACETAMINOPHEN 10-325 MG PO TABS: 1.0000 | ORAL_TABLET | Freq: Two times a day (BID) | ORAL | 0 refills | Status: DC

## 2015-12-16 NOTE — Progress Notes (Signed)
INJECTION: Patient was given informed consent, signed copy in the chart. Appropriate time out was taken. Area prepped and draped in usual sterile fashion. 1 cc of methylprednisolone 40 mg/ml plus  4 c of 1% lidocaine without epinephrine was injected into the right shoulder using a posterior approach. This was repeated on the left shoulder.The patient tolerated the procedure well. There were no complications. Post procedure instructions were given.

## 2015-12-16 NOTE — Patient Instructions (Signed)
Follow up in 1 month If you have worsening redness, pain of your shoulders return the office for reevaluation If any questions or concerns call [phone] Your prescribed Percocet. We discussed risks and benefits of taking chronic narcotics

## 2015-12-16 NOTE — Progress Notes (Signed)
   Subjective:    Patient ID: Joel Donaldson, male    DOB: [birth year], 75 y.o.   MRN: [id number]   [id number] for shoulder injection  HPI: 75 year old male with history of DJD and chronic joint pain presents for shoulder injection   shoulder pain - Takes Percocet twice a day for shoulder and knee pain. - At knee injection 2 weeks ago and pain has markedly improved - Denies new weakness, trauma, irritation to her shoulders  Upper Respiratory Tract infection - has had cough, scratchy throat, occasional fever for the last 2 weeks - denies fever - has been taking OTC alka seltzer + which as been helping - denies N/V associated but has had three episodes of diarrhea today, last 1 hour prior to this visit - no abdominal pain  Smoking status reviewed Non smoker Review of Systems  Per HPI, else denies  chest pain, shortness of breath,  weakness    Objective:  BP 120/73   Pulse (!) 104   Temp 97.8 F (36.6 C) (Oral)   Ht 5\' 10"  (1.778 m)   Wt 187 lb (84.8 kg)   SpO2 98%   BMI 26.83 kg/m  Vitals and nursing note reviewed  General: NAD Cardiac: RRR, Respiratory: CTAB, normal effort Extremities:  No shoulder swelling, skin changes or tenderness to palaption Skin: warm and dry, no rashes noted Neuro: alert and oriented   Assessment & Plan:    Osteoarthritis of both shoulders Steroid injection of both shoulders today. See procedure note  Chronic pain Percocet 10-325 #60 prescribed. Narcotic data base reviewed. Anticipate reduction of narcotics with steroid joint injections    Creg Gilmer A. Lincoln Brigham MD, [facility] Resident PGY-3 Pager [phone]

## 2015-12-16 NOTE — Assessment & Plan Note (Signed)
Steroid injection of both shoulders today. See procedure note

## 2015-12-16 NOTE — Assessment & Plan Note (Signed)
Percocet 10-325 #60 prescribed. Narcotic data base reviewed. Anticipate reduction of narcotics with steroid joint injections

## 2015-12-19 DIAGNOSIS — M19012 Primary osteoarthritis, left shoulder: Secondary | ICD-10-CM

## 2015-12-19 DIAGNOSIS — M19011 Primary osteoarthritis, right shoulder: Secondary | ICD-10-CM | POA: Diagnosis not present

## 2015-12-19 MED ORDER — METHYLPREDNISOLONE ACETATE 40 MG/ML IJ SUSP
40.0000 mg | Freq: Once | INTRAMUSCULAR | Status: AC
  Administered 2015-12-19: 40 mg via INTRA_ARTICULAR

## 2015-12-19 NOTE — Addendum Note (Signed)
Addended by: Londell Moh T on: 12/19/2015 10:11 AM   Modules accepted: Orders

## 2016-01-04 ENCOUNTER — Other Ambulatory Visit: Payer: Self-pay | Admitting: Student

## 2016-01-18 ENCOUNTER — Ambulatory Visit: Payer: Medicare Other | Admitting: Student

## 2016-02-17 ENCOUNTER — Ambulatory Visit: Payer: Self-pay | Admitting: Family Medicine

## 2016-02-17 ENCOUNTER — Encounter (HOSPITAL_COMMUNITY): Payer: Self-pay

## 2016-02-17 ENCOUNTER — Emergency Department (HOSPITAL_COMMUNITY): Payer: Medicare Other

## 2016-02-17 ENCOUNTER — Emergency Department (HOSPITAL_COMMUNITY)
Admission: EM | Admit: 2016-02-17 | Discharge: 2016-02-17 | Disposition: A | Discharge status: Home / Self Care | Payer: Medicare Other | Attending: Emergency Medicine | Admitting: Emergency Medicine

## 2016-02-17 DIAGNOSIS — Z8546 Personal history of malignant neoplasm of prostate: Secondary | ICD-10-CM | POA: Insufficient documentation

## 2016-02-17 DIAGNOSIS — Z7982 Long term (current) use of aspirin: Secondary | ICD-10-CM | POA: Insufficient documentation

## 2016-02-17 DIAGNOSIS — J449 Chronic obstructive pulmonary disease, unspecified: Secondary | ICD-10-CM | POA: Diagnosis not present

## 2016-02-17 DIAGNOSIS — R42 Dizziness and giddiness: Secondary | ICD-10-CM | POA: Diagnosis not present

## 2016-02-17 DIAGNOSIS — Z87891 Personal history of nicotine dependence: Secondary | ICD-10-CM | POA: Insufficient documentation

## 2016-02-17 DIAGNOSIS — I10 Essential (primary) hypertension: Secondary | ICD-10-CM | POA: Insufficient documentation

## 2016-02-17 LAB — CBC WITH DIFFERENTIAL/PLATELET
BASOS ABS: 0 10*3/uL (ref 0.0–0.1)
BASOS PCT: 0 %
EOS ABS: 0 10*3/uL (ref 0.0–0.7)
EOS PCT: 0 %
HCT: 37.6 % — ABNORMAL LOW (ref 39.0–52.0)
Hemoglobin: 12.4 g/dL — ABNORMAL LOW (ref 13.0–17.0)
Lymphocytes Relative: 21 %
Lymphs Abs: 1 10*3/uL (ref 0.7–4.0)
MCH: 28.3 pg (ref 26.0–34.0)
MCHC: 33 g/dL (ref 30.0–36.0)
MCV: 85.8 fL (ref 78.0–100.0)
MONO ABS: 0.4 10*3/uL (ref 0.1–1.0)
Monocytes Relative: 9 %
Neutro Abs: 3.2 10*3/uL (ref 1.7–7.7)
Neutrophils Relative %: 70 %
PLATELETS: 185 10*3/uL (ref 150–400)
RBC: 4.38 MIL/uL (ref 4.22–5.81)
RDW: 15.1 % (ref 11.5–15.5)
WBC: 4.6 10*3/uL (ref 4.0–10.5)

## 2016-02-17 LAB — COMPREHENSIVE METABOLIC PANEL
ALBUMIN: 4.2 g/dL (ref 3.5–5.0)
ALT: 21 U/L (ref 17–63)
AST: 25 U/L (ref 15–41)
Alkaline Phosphatase: 56 U/L (ref 38–126)
Anion gap: 10 (ref 5–15)
BUN: 16 mg/dL (ref 6–20)
CHLORIDE: 104 mmol/L (ref 101–111)
CO2: 27 mmol/L (ref 22–32)
Calcium: 8.6 mg/dL — ABNORMAL LOW (ref 8.9–10.3)
Creatinine, Ser: 1.08 mg/dL (ref 0.61–1.24)
GFR calc Af Amer: >60 mL/min (ref >60)
Glucose, Bld: 107 mg/dL — ABNORMAL HIGH (ref 65–99)
POTASSIUM: 3.4 mmol/L — AB (ref 3.5–5.1)
SODIUM: 141 mmol/L (ref 135–145)
Total Bilirubin: 0.6 mg/dL (ref 0.3–1.2)
Total Protein: 6.5 g/dL (ref 6.5–8.1)

## 2016-02-17 MED ORDER — SODIUM CHLORIDE 0.9 % IV BOLUS (SEPSIS)
500.0000 mL | Freq: Once | INTRAVENOUS | Status: AC
  Administered 2016-02-17: 500 mL via INTRAVENOUS

## 2016-02-17 MED ORDER — MECLIZINE HCL 25 MG PO TABS
25.0000 mg | ORAL_TABLET | Freq: Once | ORAL | Status: AC
  Administered 2016-02-17: 25 mg via ORAL
  Filled 2016-02-17: qty 1

## 2016-02-17 MED ORDER — SODIUM CHLORIDE 0.9 % IV SOLN
INTRAVENOUS | Status: DC
  Administered 2016-02-17: 14:00:00 via INTRAVENOUS

## 2016-02-17 MED ORDER — LORAZEPAM 2 MG/ML IJ SOLN
0.5000 mg | Freq: Once | INTRAMUSCULAR | Status: AC
  Administered 2016-02-17: 0.5 mg via INTRAVENOUS
  Filled 2016-02-17: qty 1

## 2016-02-17 MED ORDER — MECLIZINE HCL 25 MG PO TABS: 25.0000 mg | ORAL_TABLET | Freq: Three times a day (TID) | ORAL | 0 refills | Status: DC | PRN

## 2016-02-17 NOTE — ED Notes (Signed)
Pt denies dizziness or nausea when standing.

## 2016-02-17 NOTE — ED Notes (Signed)
Patient transported to CT 

## 2016-02-17 NOTE — ED Provider Notes (Signed)
[facility] DEPT Provider Note   CSN: [id number] Arrival date & time: 02/17/16  1243     History   Chief Complaint Chief Complaint  Patient presents with  . Headache  . Dizziness    HPI Joel Donaldson is a 76 y.o. male.  76 year old male with history of anxiety presents after mechanical fall 1 week ago. States she struck the right side of his head and cyst that time has had dizziness that is worse with standing and moving his head certain directions. He has had no nausea vomiting. No confusion. Denies any gait disturbance. States that he awoke from a nightmare and fell out of bed. Has been very tremulous since then. Denies any ear pain or neck discomfort. No pain in his chest or hips. Symptoms persistent and better with remaining still. No treatment use prior to arrival      Past Medical History:  Diagnosis Date  . Anxiety   . Arthritis    hands, back, knees  . Asthma   . Chest pain at rest 12/19/2011  . Depression   . GERD (gastroesophageal reflux disease)   . Glaucoma   . Heart murmur asymptomatic  . Hx of radioisotope therapy 07/26/11   Radiosactive Prostate Seed Implant/ I-125 Seeds  . Hypertension   . Nocturia   . Prostate cancer ([place])   . Rheumatoid arthritis ([place]) 10/10/2012  . Short of breath on exertion     Patient Active Problem List   Diagnosis Date Noted  . Eustachian tube dysfunction 11/17/2015  . Back pain 09/19/2015  . Chronic pain 12/15/2014  . Syncope 12/07/2014  . Encounter for chronic pain management 08/23/2014  . Health care maintenance 05/20/2014  . Unspecified vitamin D deficiency 07/07/2013  . Resting tremor 11/10/2012  . Rheumatoid arthritis ([place]) 10/10/2012  . Osteoarthritis of both shoulders 02/15/2012  . Tendinopathy of rotator cuff 01/20/2012  . Preventative health care 12/03/2011  . Osteoarthritis of both knees 07/02/2011  . Alcohol use 05/01/2010  . INTENTION TREMOR 09/09/2008  . Prostate cancer ([facility]) 07/01/2008  .  HYPERLIPIDEMIA 06/24/2007  . COPD 05/12/2007  . MIGRAINE, UNSPEC., W/O INTRACTABLE MIGRAINE 04/11/2006  . HYPERTENSION, BENIGN SYSTEMIC 04/11/2006  . GASTROESOPHAGEAL REFLUX, NO ESOPHAGITIS 04/11/2006    Past Surgical History:  Procedure Laterality Date  . CYSTOSCOPY  09/14/2011   Procedure: CYSTOSCOPY FLEXIBLE;  Surgeon: Hanley Ben, MD;  Location: [facility];  Service: Urology;  Laterality: N/A;  No seeds seen in the bladder  . LUMBAR FUSION  1995  . RADIOACTIVE SEED IMPLANT  09/14/2011   Procedure: RADIOACTIVE SEED IMPLANT;  Surgeon: Hanley Ben, MD;  Location: [facility];  Service: Urology;  Laterality: N/A;  Total number of seeds -    . SHOULDER SURGERY  YRS AGO   LEFT       Home Medications    Prior to Admission medications   Medication Sig Start Date End Date Taking? Authorizing Provider  aspirin EC 81 MG tablet Take 81 mg by mouth every morning.    Yes Historical Provider, MD  calcium carbonate (TUMS - DOSED IN MG ELEMENTAL CALCIUM) 500 MG chewable tablet Chew 1 tablet by mouth daily as needed for indigestion or heartburn.   Yes Historical Provider, MD  ergocalciferol (VITAMIN D2) 50000 UNITS capsule Take 1 capsule (50,000 Units total) by mouth once a week. 07/07/13  Yes Waldemar Dickens, MD  lisinopril-hydrochlorothiazide (PRINZIDE,ZESTORETIC) 20-12.5 MG tablet Take 1 tablet by mouth daily. Patient taking differently: Take 1 tablet  by mouth every morning.  11/28/15  Yes Alyssa A Lincoln Brigham, MD  nitroGLYCERIN (NITROSTAT) 0.4 MG SL tablet Place 0.4 mg under the tongue every 5 (five) minutes as needed for chest pain (chest pain).  12/20/11  Yes Sharon [address], MD  omeprazole (PRILOSEC) 40 MG capsule TAKE ONE CAPSULE BY MOUTH EVERY MORNING Patient taking differently: TAKE 40 MG BY MOUTH EVERY MORNING 01/04/16  Yes Alyssa A Haney, MD  oxyCODONE-acetaminophen (PERCOCET) 10-325 MG tablet Take 1 tablet by mouth 2 (two) times daily. 12/16/15  Yes  Alyssa A Haney, MD  PROAIR HFA 108 (90 BASE) MCG/ACT inhaler INHALE 2 PUFFS INTO THE LUNGS EVERY 6 HOURS AS NEEDED FOR WHEEZING OR SHORTNESS OF BREATH 12/30/14  Yes Alyssa A Haney, MD  acetaminophen (TYLENOL) 325 MG tablet Take 2 tablets (650 mg total) by mouth every 6 (six) hours as needed. Patient not taking: Reported on 02/17/2016 07/13/14   Veatrice Bourbon, MD  amoxicillin-clavulanate (AUGMENTIN) 875-125 MG tablet Take 1 tablet by mouth every 12 (twelve) hours. Patient not taking: Reported on 02/17/2016 03/17/15   [facility] Ward, PA-C  baclofen (LIORESAL) 10 MG tablet Take 1 tablet (10 mg total) by mouth 3 (three) times daily. Patient not taking: Reported on 02/17/2016 04/19/15   Katheren Shams, DO  fluticasone [facility]) 50 MCG/ACT nasal spray Place 2 sprays into both nostrils daily. Patient not taking: Reported on 02/17/2016 11/17/15   Virginia Crews, MD  ondansetron (ZOFRAN ODT) 4 MG disintegrating tablet Take 1 tablet (4 mg total) by mouth every 8 (eight) hours as needed for nausea or vomiting. Patient not taking: Reported on 02/17/2016 06/17/14   Delice Bison Ward, DO  promethazine (PHENERGAN) 25 MG tablet Take 1 tablet (25 mg total) by mouth every 8 (eight) hours as needed for nausea or vomiting. Patient not taking: Reported on 02/17/2016 11/03/14   Burna Cash Rumley, DO  tiZANidine (ZANAFLEX) 2 MG tablet Take 1 tablet (2 mg total) by mouth every 8 (eight) hours as needed for muscle spasms (or lower back pain). Patient not taking: Reported on 02/17/2016 09/19/15   Archie Patten, MD    Family History Family History  Problem Relation Age of Onset  . Diabetes Mother     Social History Social History  Substance Use Topics  . Smoking status: Former Smoker    Packs/day: 2.00    Years: 50.00    Types: Cigarettes    Quit date: 10/13/2004  . Smokeless tobacco: Never Used     Comment: Still not smoking.   . Alcohol use 8.4 oz/week    14 Shots of liquor per week     Comment: occassional      Allergies   Tramadol and Pollen extract   Review of Systems Review of Systems  All other systems reviewed and are negative.    Physical Exam Updated Vital Signs BP 136/93 (BP Location: Right Arm)   Pulse 118   Temp 97.7 F (36.5 C) (Oral)   Resp 20   SpO2 96%   Physical Exam  Constitutional: He is oriented to person, place, and time. He appears well-developed and well-nourished.  Non-toxic appearance. No distress.  HENT:  Head: Normocephalic and atraumatic.  Eyes: Conjunctivae, EOM and lids are normal. Pupils are equal, round, and reactive to light.  Neck: Normal range of motion. Neck supple. No tracheal deviation present. No thyroid mass present.  Cardiovascular: Normal rate, regular rhythm and normal heart sounds.  Exam reveals no gallop.   No murmur  heard. Pulmonary/Chest: Effort normal and breath sounds normal. No stridor. No respiratory distress. He has no decreased breath sounds. He has no wheezes. He has no rhonchi. He has no rales.  Abdominal: Soft. Normal appearance and bowel sounds are normal. He exhibits no distension. There is no tenderness. There is no rebound and no CVA tenderness.  Musculoskeletal: Normal range of motion. He exhibits no edema or tenderness.  Neurological: He is alert and oriented to person, place, and time. He has normal strength. He displays tremor. No cranial nerve deficit or sensory deficit. GCS eye subscore is 4. GCS verbal subscore is 5. GCS motor subscore is 6.  Skin: Skin is warm and dry. No abrasion and no rash noted.  Psychiatric: He has a normal mood and affect. His speech is normal and behavior is normal.  Nursing note and vitals reviewed.    ED Treatments / Results  Labs (all labs ordered are listed, but only abnormal results are displayed) Labs Reviewed  CBC WITH DIFFERENTIAL/PLATELET  COMPREHENSIVE METABOLIC PANEL    EKG  EKG Interpretation None       Radiology No results found.  Procedures Procedures  (including critical care time)  Medications Ordered in ED Medications  0.9 %  sodium chloride infusion (not administered)  sodium chloride 0.9 % bolus 500 mL (not administered)  LORazepam (ATIVAN) injection 0.5 mg (not administered)  meclizine (ANTIVERT) tablet 25 mg (not administered)     Initial Impression / Assessment and Plan / ED Course  I have reviewed the triage vital signs and the nursing notes.  Pertinent labs & imaging results that were available during my care of the patient were reviewed by me and considered in my medical decision making (see chart for details).  Clinical Course    Head ct neg for intracranial trauma Pt with suspected vertigo, also with anxiety tx with antivert and ativan and feels better, neuro exam non focal Pt not orthostatic Will tx for vertigo Final Clinical Impressions(s) / ED Diagnoses   Final diagnoses:  None    New Prescriptions New Prescriptions   No medications on file     Lacretia Leigh, MD 02/17/16 1554

## 2016-02-17 NOTE — ED Triage Notes (Addendum)
Pt fell last Friday striking head on rt temple.  Pt states since then he has had headache with dizziness.  No thinners.  No n/v.  Pt states fall was mechanical.  Had a nightmare and was getting out of bed.

## 2016-02-29 ENCOUNTER — Ambulatory Visit: Payer: Self-pay | Admitting: Student

## 2016-03-16 ENCOUNTER — Encounter: Payer: Self-pay | Admitting: Student

## 2016-03-16 ENCOUNTER — Ambulatory Visit (INDEPENDENT_AMBULATORY_CARE_PROVIDER_SITE_OTHER): Payer: Medicare Other | Admitting: Student

## 2016-03-16 DIAGNOSIS — M170 Bilateral primary osteoarthritis of knee: Secondary | ICD-10-CM | POA: Diagnosis not present

## 2016-03-16 DIAGNOSIS — M19011 Primary osteoarthritis, right shoulder: Secondary | ICD-10-CM | POA: Diagnosis not present

## 2016-03-16 DIAGNOSIS — Z23 Encounter for immunization: Secondary | ICD-10-CM | POA: Diagnosis not present

## 2016-03-16 DIAGNOSIS — Z0000 Encounter for general adult medical examination without abnormal findings: Secondary | ICD-10-CM | POA: Diagnosis not present

## 2016-03-16 DIAGNOSIS — M19012 Primary osteoarthritis, left shoulder: Secondary | ICD-10-CM

## 2016-03-16 MED ORDER — OXYCODONE-ACETAMINOPHEN 10-325 MG PO TABS: 1.0000 | ORAL_TABLET | Freq: Two times a day (BID) | ORAL | 0 refills | Status: DC

## 2016-03-16 NOTE — Assessment & Plan Note (Signed)
Flu shot today 

## 2016-03-16 NOTE — Assessment & Plan Note (Addendum)
Pain 2-3 weeks ago returned after steroid injections on 12/05/2015. Duration of effect is approximately 3 months which is encouraging as it limits his need for narcotic pain medication. Last refilled percocet on 12/29/2015 per [place] dug data base review - he will return for repeat steroid injection of his knees - will consider referral to sports medicine for injection of viscous solution if he continues to have pain after this next injection

## 2016-03-16 NOTE — Assessment & Plan Note (Addendum)
Pain returned after steroid injection on 12/16/2015.  He is now due for repeat injection as it has been three months - will follow for steroid injection - continue percocet for now, drug database reviewed with no red flags - continue non narcotic pain medications like tylenol as well

## 2016-03-16 NOTE — Patient Instructions (Addendum)
Follow up for injection of both knees If you have any questions or concerns, call the office at [phone]

## 2016-03-16 NOTE — Progress Notes (Signed)
   Subjective:    Patient ID: MILA BIRNER, male    DOB: Jun 14, 1940, 76 y.o.   MRN: [id number]   CC: Knee and shoulder arthritis  HPI: 76 year old male with a past medical history of osteoarthritis of both knees and both shoulders presents for pain medication refill  Shoulder and knee OA - Had bilateral shoulder injections in November 2017 and bilateral knee injections in October 2017 - He feels that both shoulders and both knees have become painful again 2-3 weeks ago - He is considering a repeat injections will like a refill of his pain medications as he is out of Percocet - Denies weakness, recent injury - Quality of his pain is same as it was prior to the injections - Feels Percocet will help him to function  Smoking status reviewed  Review of Systems  Per HPI, else denies recent illness, fever, chest pain, shortness of breath   Objective:  BP 122/76   Pulse 72   Temp 98.5 F (36.9 C) (Oral)   Wt 187 lb (84.8 kg)   SpO2 99%   BMI 26.83 kg/m  Vitals and nursing note reviewed  General: NAD Cardiac: RRR Respiratory: CTAB, normal effort Extremities: no edema or cyanosis.  Skin: warm and dry, no rashes noted MSK: Mild tenderness to palpation over bilateral knees and glenohumeral joints Neuro: alert and oriented    Assessment & Plan:    Osteoarthritis of both knees Pain 2-3 weeks ago returned after steroid injections on 12/05/2015. Duration of effect is approximately 3 months which is encouraging as it limits his need for narcotic pain medication. Last refilled percocet on 12/29/2015 per [place] dug data base review - he will return for repeat steroid injection of his knees - will consider referral to sports medicine for injection of viscous solution if he continues to have pain after this next injection   Osteoarthritis of both shoulders Pain returned after steroid injection on 12/16/2015.  He is now due for repeat injection as it has been three months - will  follow for steroid injection - continue percocet for now, drug database reviewed with no red flags - continue non narcotic pain medications like tylenol as well   Health care maintenance Flu shot today    Warrick Llera A. Lincoln Brigham MD, [facility] Resident PGY-3 Pager [phone]

## 2016-03-21 ENCOUNTER — Encounter: Payer: Self-pay | Admitting: Student

## 2016-03-21 ENCOUNTER — Ambulatory Visit (INDEPENDENT_AMBULATORY_CARE_PROVIDER_SITE_OTHER): Payer: Medicare Other | Admitting: Student

## 2016-03-21 VITALS — BP 120/80 | HR 74 | Temp 98.7°F | Wt 184.0 lb

## 2016-03-21 DIAGNOSIS — M19011 Primary osteoarthritis, right shoulder: Secondary | ICD-10-CM

## 2016-03-21 DIAGNOSIS — M25561 Pain in right knee: Secondary | ICD-10-CM

## 2016-03-21 DIAGNOSIS — M25562 Pain in left knee: Secondary | ICD-10-CM

## 2016-03-21 DIAGNOSIS — M170 Bilateral primary osteoarthritis of knee: Secondary | ICD-10-CM

## 2016-03-21 DIAGNOSIS — M19012 Primary osteoarthritis, left shoulder: Secondary | ICD-10-CM

## 2016-03-21 DIAGNOSIS — G8929 Other chronic pain: Secondary | ICD-10-CM

## 2016-03-21 MED ORDER — METHYLPREDNISOLONE ACETATE 40 MG/ML IJ SUSP
40.0000 mg | Freq: Once | INTRAMUSCULAR | Status: AC
  Administered 2016-03-21: 40 mg via INTRAMUSCULAR

## 2016-03-21 NOTE — Assessment & Plan Note (Addendum)
Will follow for injection of his shoulders as he had success with the last steriod injection

## 2016-03-21 NOTE — Progress Notes (Signed)
INJECTION: Patient was given informed consent, signed copy in the chart. Appropriate time out was taken. Area prepped and draped in usual sterile fashion. 1 cc of methylprednisolone 40 mg/ml plus  4 cc of 1% lidocaine without epinephrine was injected into the left knee using a(n) inferolateral approach. This was then repeated on the right knee. The patient tolerated the procedure well. There were no complications. Post procedure instructions were given.  Landy Mace A. Lincoln Brigham MD, [facility] Resident PGY-2 Pager [phone]

## 2016-03-21 NOTE — Assessment & Plan Note (Signed)
Steroid injection of bilateral knees today, see procedure note - will follow as needed

## 2016-03-21 NOTE — Progress Notes (Signed)
   Subjective:    Patient ID: Joel Donaldson, male    DOB: [birth year], 77 y.o.   MRN: [id number]   CC: Presents for knee injection of knees  HPI: 76 y/o M with h/o bilateral knee osteoarthritis presents for steroid injection both knees  Steroid Injection for knee aosteoarthritis - last knee injection 10/23 and lasted for approximately three months - reports some stiffness and intermittent swelling  History of shoulder arthritis - reports steroid injection on 11/3  - reports his pain has returned  Smoking status reviewed  Review of Systems  Per HPI, else denies recent illness, fever, chest pain, shortness of breath,     Objective:  BP 120/80   Pulse 74   Temp 98.7 F (37.1 C) (Oral)   Wt 184 lb (83.5 kg)   SpO2 99%   BMI 26.40 kg/m  Vitals and nursing note reviewed  General: NAD Cardiac: RRR,  Respiratory: CTAB, normal effort Extremities: bilateral tenderness of both knee joints, no effusions noted Skin: warm and dry, no rashes noted Neuro: alert and oriented, no focal deficits   Assessment & Plan:    Osteoarthritis of both knees Steroid injection of bilateral knees today, see procedure note - will follow as needed  Osteoarthritis of both shoulders Will follow for injection of his shoulders as he had success with the last steriod injection    Rainie Crenshaw A. Lincoln Brigham MD, [facility] Resident PGY-3 Pager [phone]

## 2016-03-21 NOTE — Patient Instructions (Signed)
Follow up in one week for shoulder injection If your knees get more swollen, red or painful return for evaluation If you have questions or concerns, call the office at [phone]

## 2016-03-27 ENCOUNTER — Encounter: Payer: Self-pay | Admitting: Student

## 2016-03-27 ENCOUNTER — Ambulatory Visit (INDEPENDENT_AMBULATORY_CARE_PROVIDER_SITE_OTHER): Payer: Medicare Other | Admitting: Student

## 2016-03-27 VITALS — BP 102/63 | HR 84 | Temp 97.8°F | Wt 188.0 lb

## 2016-03-27 DIAGNOSIS — G8929 Other chronic pain: Secondary | ICD-10-CM

## 2016-03-27 DIAGNOSIS — M170 Bilateral primary osteoarthritis of knee: Secondary | ICD-10-CM

## 2016-03-27 DIAGNOSIS — M19011 Primary osteoarthritis, right shoulder: Secondary | ICD-10-CM

## 2016-03-27 DIAGNOSIS — M19012 Primary osteoarthritis, left shoulder: Secondary | ICD-10-CM

## 2016-03-27 DIAGNOSIS — M25512 Pain in left shoulder: Secondary | ICD-10-CM

## 2016-03-27 DIAGNOSIS — M25511 Pain in right shoulder: Secondary | ICD-10-CM

## 2016-03-27 MED ORDER — METHYLPREDNISOLONE ACETATE 40 MG/ML IJ SUSP
40.0000 mg | Freq: Once | INTRAMUSCULAR | Status: AC
  Administered 2016-03-27: 40 mg via INTRAMUSCULAR

## 2016-03-27 NOTE — Progress Notes (Signed)
INJECTION: Patient was given informed consent, signed copy in the chart. Appropriate time out was taken. Area prepped and draped in usual sterile fashion. An 18 gauge need was used to aspirate from the area concerning for effusion of the left knee. No fluid was able to be aspirated and the procedure was terminated per patient request. Overall,  the patient tolerated the procedure well. There were no complications. Post procedure instructions were given.

## 2016-03-27 NOTE — Progress Notes (Signed)
   Subjective:    Patient ID: Joel Donaldson, male    DOB: May 27, 1940, 76 y.o.   MRN: [id number]   CC: bilateral shoulder and knee arthritis and pain  HPI: 76 y/o M with bilateral shoulder and knee arthritis presents for shoulder injection and concern for knee pain  Shoulder Injection - last injection was on 11/3 - he felt his injection helped a lot but started to wear off about 3 months after the shot - denies recent injury or weakness  Knee pain - pain worse on the left - last injection was on 2/7 of both knees - he feels this injection was not as effective as his first steroid injection - his pain has improved some but now as much as after the first injection - he denies worsening swelling, weakness or recent injury  Smoking status reviewed  Review of Systems  Per HPI, else denies recent illness, fever, chest pain, shortness of breath,     Objective:  BP 102/63   Pulse 84   Temp 97.8 F (36.6 C) (Oral)   Wt 188 lb (85.3 kg)   SpO2 98%   BMI 26.98 kg/m  Vitals and nursing note reviewed  General: NAD Cardiac: RRR Respiratory: CTAB, normal effort MSK: possible joint effusion of the left knee, else no further effusions or swelling noted of either knee, no tenderness to palpation 5/5 bilateral LE strength to hip flexion, knee extension and flexion, foot dorsi/plantar flexion Skin: warm and dry, no rashes noted Neuro: alert and oriented, no focal deficits   Assessment & Plan:    Osteoarthritis of both knees Still some pain but mildly improved after last shot 1 week ago. Tere was some concern for joint effusion  but no fluid was able to be aspirated  ( see procedure note) . - will continue OTC pain meds with percocet - will consider sports medicine referral for possible viscous injection   Osteoarthritis of both shoulders Steroid injection performed today ( see procedure note)    Quoc Tome A. Lincoln Brigham MD, [facility] Resident PGY-3 Pager [phone]

## 2016-03-27 NOTE — Assessment & Plan Note (Signed)
Steroid injection performed today ( see procedure note)

## 2016-03-27 NOTE — Progress Notes (Signed)
INJECTION: Patient was given informed consent, signed copy in the chart. Appropriate time out was taken. Area prepped and draped in usual sterile fashion. 1 cc of methylprednisolone 40 mg/ml plus  4 cc of 1% lidocaine without epinephrine was injected into the left shoulder using a(n) posterior approach. This was repeated on the right shoulder. The patient tolerated the procedure well. There were no complications. Post procedure instructions were given.  Sukhraj Esquivias A. Lincoln Brigham MD, [facility] Resident PGY-3 Pager [phone]

## 2016-03-27 NOTE — Patient Instructions (Signed)
Follow up in one month for osteoarthritis If you have worsening swelling, pain in your shoulders or left knee or you develop fevers return to the office  If you have questions or concerns, call the office at [phone]

## 2016-03-27 NOTE — Assessment & Plan Note (Signed)
Still some pain but mildly improved after last shot 1 week ago. Tere was some concern for joint effusion  but no fluid was able to be aspirated  ( see procedure note) . - will continue OTC pain meds with percocet - will consider sports medicine referral for possible viscous injection

## 2016-04-06 ENCOUNTER — Other Ambulatory Visit: Payer: Self-pay | Admitting: Student

## 2016-04-25 ENCOUNTER — Ambulatory Visit (INDEPENDENT_AMBULATORY_CARE_PROVIDER_SITE_OTHER): Payer: Medicare Other | Admitting: Student

## 2016-04-25 ENCOUNTER — Encounter: Payer: Self-pay | Admitting: Student

## 2016-04-25 DIAGNOSIS — M19012 Primary osteoarthritis, left shoulder: Secondary | ICD-10-CM

## 2016-04-25 DIAGNOSIS — M19011 Primary osteoarthritis, right shoulder: Secondary | ICD-10-CM

## 2016-04-25 MED ORDER — METHYLPREDNISOLONE ACETATE 40 MG/ML IJ SUSP
40.0000 mg | Freq: Once | INTRAMUSCULAR | Status: AC
  Administered 2016-04-25: 40 mg via INTRAMUSCULAR

## 2016-04-25 MED ORDER — OXYCODONE-ACETAMINOPHEN 10-325 MG PO TABS: 1.0000 | ORAL_TABLET | Freq: Two times a day (BID) | ORAL | 0 refills | Status: DC

## 2016-04-25 NOTE — Progress Notes (Signed)
   Subjective:    Patient ID: Joel Donaldson, male    DOB: 1940/04/02, 76 y.o.   MRN: [id number]   CC: shoulder pain, desire for injection  HPI: 76 y/o M with shoulder OA presents for shoulder injection  Shoulder OA - last injection was on 2/13 - he feels the injection helped his pain but it recently returned and he would like a repeat injection - he denies any falls or injuries  Smoking status reviewed  Review of Systems  Per HPI, else denies recent illness, fever, chest pain, shortness of breath,     Objective:  BP 112/70   Pulse (!) 119   Temp 97.8 F (36.6 C) (Oral)   SpO2 94%  Vitals and nursing note reviewed  General: NAD Cardiac: RRR, Respiratory: CTAB, normal effort MSK: normal ROM on left shoulder, reduced ROM of right shoulder mild pain of the shoulder with external rotation   Assessment & Plan:    Osteoarthritis of both shoulders Patient with significant bilateral shoulder OA. Had steroid injected into both shoulders on 2/13 with some relief that lasted until a few days ago and now his pain has returned. He asks for repeat injection. Given the end stages of his OA and significant pain, will inject today but will need to refer to sports med if this injection does not last for a significant amount of time - percocet refilled for chronic OA pain    Joel Boldman A. Lincoln Brigham MD, [facility] Resident PGY-3 Pager [phone]

## 2016-04-25 NOTE — Addendum Note (Signed)
Addended by: Dorna Bloom on: 04/25/2016 04:33 PM   Modules accepted: Orders

## 2016-04-25 NOTE — Progress Notes (Signed)
INJECTION: Patient was given informed consent, signed copy in the chart. Appropriate time out was taken. Left shoulder was prepped and draped in usual sterile fashion. 1 cc of methylprednisolone 40 mg/ml plus  4 cc of 1% lidocaine without epinephrine was injected into the left shoulder using a(n) posterior approach. This was repeated on the right. The patient tolerated the procedure well. There were no complications. Post procedure instructions were given.

## 2016-04-25 NOTE — Assessment & Plan Note (Signed)
Patient with significant bilateral shoulder OA. Had steroid injected into both shoulders on 2/13 with some relief that lasted until a few days ago and now his pain has returned. He asks for repeat injection. Given the end stages of his OA and significant pain, will inject today but will need to refer to sports med if this injection does not last for a significant amount of time - percocet refilled for chronic OA pain

## 2016-04-25 NOTE — Patient Instructions (Addendum)
Follow up in 1 month for knee pain If you have worsening shoulder pain, swelling call the office If you have any questions or concerns, call the office at [phone]

## 2016-05-22 ENCOUNTER — Ambulatory Visit (INDEPENDENT_AMBULATORY_CARE_PROVIDER_SITE_OTHER): Payer: Medicare Other | Admitting: Student

## 2016-05-22 ENCOUNTER — Encounter: Payer: Self-pay | Admitting: Student

## 2016-05-22 VITALS — BP 110/64 | HR 106 | Temp 98.0°F | Ht 70.0 in | Wt 189.0 lb

## 2016-05-22 DIAGNOSIS — M170 Bilateral primary osteoarthritis of knee: Secondary | ICD-10-CM

## 2016-05-22 MED ORDER — OXYCODONE-ACETAMINOPHEN 10-325 MG PO TABS: 1.0000 | ORAL_TABLET | Freq: Two times a day (BID) | ORAL | 0 refills | Status: DC

## 2016-05-22 NOTE — Assessment & Plan Note (Signed)
We have tried two round of steroid injections and it appears they are lasting for a lesser duration. I do think that viscous supplementation may be a good option as he does not wish to have surgery - refer to sports medicine - percocet refilled - will follow as needed

## 2016-05-22 NOTE — Progress Notes (Signed)
   Subjective:    Patient ID: Joel Donaldson, male    DOB: 02-24-1940, 76 y.o.   MRN: [id number]   CC: follow up knee pain  HPI: 76 y/o M with h/o bilateral knee OA presents for continued knee pain   Knee pain - had bilateral knee steroid injections on 03/21/2016 - he feels the injections lasted about 1 month and he started to have more pain - he does take percocet and tylenol for his knee pain - he denies any injuries, fall, weakness - his pain is unchanged from his baseline pain - he is strongly against surgery   Smoking status reviewed  Review of Systems  Per HPI, else denies recent illness, fever, chest pain, shortness of breath    Objective:  BP 110/64   Pulse (!) 106   Temp 98 F (36.7 C) (Oral)   Ht 5\' 10"  (1.778 m)   Wt 189 lb (85.7 kg)   SpO2 99%   BMI 27.12 kg/m  Vitals and nursing note reviewed  General: NAD Cardiac: RRR Respiratory: CTAB, normal effort Extremities: bilateral knees without evidence of effusion or swelling, able to extend both knees fully, can flex knees to about 120 degrees bilaterally Skin: warm and dry, no rashes noted Neuro: alert and oriented, no focal deficits   Assessment & Plan:    Osteoarthritis of both knees We have tried two round of steroid injections and it appears they are lasting for a lesser duration. I do think that viscous supplementation may be a good option as he does not wish to have surgery - refer to sports medicine - percocet refilled - will follow as needed    Galena Logie A. Lincoln Brigham MD, [facility] Resident PGY-3 Pager [phone]

## 2016-05-22 NOTE — Patient Instructions (Signed)
Follow up with Sports medicine for knee and shoulder pain If you have any questions or concerns, call the office at [phone]

## 2016-05-30 DIAGNOSIS — H10413 Chronic giant papillary conjunctivitis, bilateral: Secondary | ICD-10-CM | POA: Diagnosis not present

## 2016-05-30 DIAGNOSIS — H2512 Age-related nuclear cataract, left eye: Secondary | ICD-10-CM | POA: Diagnosis not present

## 2016-05-30 DIAGNOSIS — H31091 Other chorioretinal scars, right eye: Secondary | ICD-10-CM | POA: Diagnosis not present

## 2016-05-30 DIAGNOSIS — H04123 Dry eye syndrome of bilateral lacrimal glands: Secondary | ICD-10-CM | POA: Diagnosis not present

## 2016-05-30 DIAGNOSIS — H25811 Combined forms of age-related cataract, right eye: Secondary | ICD-10-CM | POA: Diagnosis not present

## 2016-06-04 ENCOUNTER — Ambulatory Visit (INDEPENDENT_AMBULATORY_CARE_PROVIDER_SITE_OTHER): Payer: Medicare Other | Admitting: Sports Medicine

## 2016-06-04 ENCOUNTER — Ambulatory Visit
Admission: RE | Admit: 2016-06-04 | Discharge: 2016-06-04 | Disposition: A | Discharge status: Home / Self Care | Payer: Medicare Other | Source: Ambulatory Visit | Attending: Sports Medicine | Admitting: Sports Medicine

## 2016-06-04 VITALS — BP 131/92 | Ht 70.0 in | Wt 187.0 lb

## 2016-06-04 DIAGNOSIS — M25461 Effusion, right knee: Secondary | ICD-10-CM | POA: Diagnosis not present

## 2016-06-04 DIAGNOSIS — M25462 Effusion, left knee: Secondary | ICD-10-CM | POA: Diagnosis not present

## 2016-06-04 DIAGNOSIS — M170 Bilateral primary osteoarthritis of knee: Secondary | ICD-10-CM

## 2016-06-04 MED ORDER — METHYLPREDNISOLONE ACETATE 40 MG/ML IJ SUSP
40.0000 mg | Freq: Once | INTRAMUSCULAR | Status: AC
  Administered 2016-06-04: 40 mg via INTRA_ARTICULAR

## 2016-06-04 NOTE — Progress Notes (Signed)
   Subjective:    Patient ID: Joel Donaldson, male    DOB: 07-07-1940, 76 y.o.   MRN: [id number]  HPI chief complaint: Bilateral knee pain  Very pleasant 76 year old male comes in today at the request of his PCP to discuss Visco supplementation for bilateral knee osteoarthritis. He has had a couple of cortisone injections done recently. Each injection helped for about a month. He complains of diffuse pain and stiffness in both knees. He has not noticed any significant swelling. He does not want to have a total knee arthroplasty. He has not had any recent x-rays. He denies any recent falls.  Past medical history reviewed Medications reviewed Allergies reviewed     Review of Systems As above     Objective:   Physical Exam Well-developed, well-nourished. No acute distress  Examination of both knees shows about a 5 extension lag bilaterally. Flexion is to 120. Bony hypertrophy of both knees. Moderate valgus thrust on the right with standing. No joint line tenderness. 1+ patellofemoral crepitus bilaterally.Pesudo laxity of the MCL bilaterally, right greater than left. Neurovascularly intact distally.       Assessment & Plan:  Bilateral knee pain secondary to DJD  Before proceeding with Visco supplementation we will need to get updated x-rays of his knees. He has not had any x-rays since 2013. I've agreed to reinject his knees today with cortisone for pain control. I will call him after I look at his x-rays. He understands that if he has bone-on-bone osteoarthritis then the Visco supplementation may not work. We will discuss this further after I review his x-rays.  Consent obtained and verified. Time-out conducted. Noted no overlying erythema, induration, or other signs of local infection. Skin prepped in a sterile fashion. Topical analgesic spray: Ethyl chloride. Joint: right knee, anterior medial approach Needle: 25g 1.5 inch Completed without difficulty. Meds: 3cc 1% xylocaine,  1cc (40mg ) depomedrol  Advised to call if fevers/chills, erythema, induration, drainage, or persistent bleeding.  Consent obtained and verified. Time-out conducted. Noted no overlying erythema, induration, or other signs of local infection. Skin prepped in a sterile fashion. Topical analgesic spray: Ethyl chloride. Joint: left knee, anterior medial approach Needle: 25g 1.5 inch Completed without difficulty. Meds: 3cc 1% xylocaine, 1cc (40mg ) depomedrol  Advised to call if fevers/chills, erythema, induration, drainage, or persistent bleeding.

## 2016-06-05 ENCOUNTER — Telehealth: Payer: Self-pay | Admitting: Sports Medicine

## 2016-06-05 NOTE — Telephone Encounter (Signed)
I spoke with the patient on the phone today after reviewing x-rays of both knees. He has advanced degenerative changes in both knees, particularly on the right. His knee pain has improved greatly after yesterday's cortisone injections. We had previously discussed the possibility of Visco supplementation but he has decided to wait on that for now. If he gets at least 3 months of relief from yesterday's cortisone injections then we can repeat those again down the road. If, however, his pain relief is short-lived then we can reconsider Visco supplementation. He does understand that definitive treatment is total knee arthroplasty but he is not ready to pursue surgery yet. Follow-up as needed.

## 2016-06-14 ENCOUNTER — Ambulatory Visit (INDEPENDENT_AMBULATORY_CARE_PROVIDER_SITE_OTHER): Payer: Medicare Other | Admitting: Student

## 2016-06-14 ENCOUNTER — Encounter: Payer: Self-pay | Admitting: Student

## 2016-06-14 VITALS — BP 128/80 | HR 100 | Temp 98.3°F | Wt 186.0 lb

## 2016-06-14 DIAGNOSIS — M7989 Other specified soft tissue disorders: Secondary | ICD-10-CM | POA: Insufficient documentation

## 2016-06-14 NOTE — Assessment & Plan Note (Addendum)
Right leg swelling with calf pain Wells score 2 making him at high risk. There is high concern for DVT given his presentation and history. Will obtain stat LE doppler study. And follow closely. He has no SOB so significant tachycardia making PE unlikely. Return precautions as well as ED precautions discussed

## 2016-06-14 NOTE — Progress Notes (Signed)
   Subjective:    Patient ID: Joel Donaldson, male    DOB: Feb 18, 1940, 76 y.o.   MRN: [id number]   CC: right leg swelling  HPI: 76 y/o M presents for right leg swelling  Right leg swelling - noted right calf and ankle swelling last night - he also has right calf pain - no recent increase in sedentary status, no recent travel, no know cancer diagnosis\ - he denies SOB, chest pain - no falls or recent injuries   Smoking status reviewed  Review of Systems  Per HPI   Objective:  BP 128/80   Pulse 100   Temp 98.3 F (36.8 C) (Oral)   Wt 186 lb (84.4 kg)   SpO2 96%   BMI 26.69 kg/m  Vitals and nursing note reviewed  General: NAD Cardiac: RRR,  Respiratory: CTAB, normal effort Extremities: 1-2+ right calf and ankle edema, right calf pain to palpation, no skin erytema, no LE edea or calf tenderness  Skin: warm and dry, no rashes noted Neuro: alert and oriented, no focal deficits   Assessment & Plan:    Right leg swelling Right leg swelling with calf pain Wells score 2 making him at high risk. There is high concern for DVT given his presentation and history. Will obtain stat LE doppler study. And follow closely. He has no SOB so significant tachycardia making PE unlikely. Return precautions as well as ED precautions discussed    Alyssa A. Lincoln Brigham MD, [place] [facility] Resident PGY-3 Pager [phone]

## 2016-06-14 NOTE — Patient Instructions (Addendum)
Follow up in 1 week for right leg swelling  Please obtain vascular study of your leg If you develop shortness of breath or chest pain, go to the [facility] Emergency Room right away If you have questions or concerns, call the office at [phone]

## 2016-06-15 ENCOUNTER — Telehealth: Payer: Self-pay | Admitting: Student

## 2016-06-15 ENCOUNTER — Ambulatory Visit (HOSPITAL_COMMUNITY)
Admission: RE | Admit: 2016-06-15 | Discharge: 2016-06-15 | Disposition: A | Discharge status: Home / Self Care | Payer: Medicare Other | Source: Ambulatory Visit | Attending: Family Medicine | Admitting: Family Medicine

## 2016-06-15 DIAGNOSIS — M7989 Other specified soft tissue disorders: Secondary | ICD-10-CM

## 2016-06-15 NOTE — Telephone Encounter (Signed)
Spoke with patient and he is aware.  Jazmin Hartsell,CMA

## 2016-06-15 NOTE — Telephone Encounter (Signed)
Venous study is negative. Fluid filled space with mixed echos posterior to the popliteal artery and vein, not vascularized. Ideology unknown. ep

## 2016-06-15 NOTE — Telephone Encounter (Signed)
Patient was called to discuss his [place] results. He did not answer. He does not have a DVT but I would still like for him to be seen next week for his leg pain. Please call the patient to inform him of this.

## 2016-06-15 NOTE — Progress Notes (Signed)
VASCULAR LAB PRELIMINARY  PRELIMINARY  PRELIMINARY  PRELIMINARY  Right lower extremity venous duplex completed.    Preliminary report:  Right:  No evidence of DVTor superficial thrombosis. There is fluid filled space with mixed echoes posterior to the popliteal artery and vein which is not vascularized. Etiology unknown.  Caliana Spires, [place], RVS 06/15/2016, 10:34 AM

## 2016-06-21 ENCOUNTER — Encounter: Payer: Self-pay | Admitting: Family Medicine

## 2016-06-21 ENCOUNTER — Ambulatory Visit (INDEPENDENT_AMBULATORY_CARE_PROVIDER_SITE_OTHER): Payer: Medicare Other | Admitting: Family Medicine

## 2016-06-21 VITALS — BP 112/70 | HR 101 | Temp 98.4°F | Ht 70.0 in | Wt 182.0 lb

## 2016-06-21 DIAGNOSIS — M170 Bilateral primary osteoarthritis of knee: Secondary | ICD-10-CM

## 2016-06-21 DIAGNOSIS — M7121 Synovial cyst of popliteal space [Baker], right knee: Secondary | ICD-10-CM | POA: Diagnosis not present

## 2016-06-21 HISTORY — DX: Synovial cyst of popliteal space (Baker), right knee: M71.21

## 2016-06-21 NOTE — Progress Notes (Signed)
    Subjective:  Joel Donaldson is a 76 y.o. male who presents to the [facility] today for follow up on R knee pain  HPI:  R knee pain - Seen on 06/14/16 for R leg swelling and calf pain but had neg venous doppler for DVT.  - Today states his R leg pain has not improved and now more in the back of his knee. Has chronic pain from his osteoarthritis but this is worse than his usual. No numbness/tingling. No falls. - Exacerbated by use and by standing. - Has tried ace bandages and is taking percocet 10-325mg  BID which helps.  - Follows with sports medicine for his osteoarthritis, last saw on 06/04/16 and received a steroid injection at that time which was beneficial for a few weeks. Patient states he has no planned follow up appointment yet.   ROS: Per HPI  Objective:  Physical Exam: BP 112/70   Pulse (!) 101   Temp 98.4 F (36.9 C) (Oral)   Ht 5\' 10"  (1.778 m)   Wt 182 lb (82.6 kg)   SpO2 97%   BMI 26.11 kg/m   Gen: NAD, resting comfortably MSK: prominent popliteal mass approximately 3cm in diameter at midline that is appreciated when leg is fully extended while standing, disappears upon flexion. No erythema or warmth. Mildly reduced ROM. No joint line tenderness. Sensation intact.  Venous ultrasound R lower extremity 06/15/16 Summary: - No evidence of deep vein or superficial thrombosis involving the   right lower extremity and left common femoral vein. - There is a fluid filled space with mixed echoes which id not   vascularized posterior to the popliteal artery and vein. This   area measures 3.18 cm x 2.13 cm. Etiology unknown cannot rule out   a Baker&'s cyst.   Assessment/Plan:  Baker's cyst of knee, right Exam consistent with Baker's cyst which corresponds with ultrasound findings. Offered patient voltaren gel but he declined in favor of wanting to discuss with sports medicine. I recommended he call them for a follow up as planned. Patient requested refills of his percocet but  given the quantity he is currently taking, will defer to PCP.   Bufford Lope, DO PGY-1, [facility] 06/21/2016 8:34 AM

## 2016-06-21 NOTE — Patient Instructions (Addendum)
It was good to meet you today.  For your osteoarthritis and Baker's cyst - Please follow up with sports medicine, you mentioned they might be prescribing you a voltaren gel.  Take care and seek immediate care sooner if you develop any concerns.   Dr. Bufford Lope, [place]

## 2016-06-21 NOTE — Assessment & Plan Note (Signed)
Exam consistent with Baker's cyst which corresponds with ultrasound findings. Offered patient voltaren gel but he declined in favor of wanting to discuss with sports medicine. I recommended he call them for a follow up as planned. Patient requested refills of his percocet but given the quantity he is currently taking, will defer to PCP.

## 2016-06-25 ENCOUNTER — Other Ambulatory Visit: Payer: Self-pay | Admitting: *Deleted

## 2016-06-25 NOTE — Telephone Encounter (Signed)
Pt calling stating that dr said she would send in pain medicine. Please advise. Deseree Kennon Holter, CMA

## 2016-06-26 NOTE — Telephone Encounter (Signed)
Patient scheduled for 07-02-16. Klara Stjames,CMA

## 2016-06-26 NOTE — Telephone Encounter (Signed)
It appears this patient saw Dr Shawna Orleans on 5/10.  She prescribed Voltaren Gel.  No narcotics without PCP visit.  He can schedule an appointment with Dr Lincoln Brigham next week if he is requiring an opioid medication.

## 2016-07-02 ENCOUNTER — Ambulatory Visit (INDEPENDENT_AMBULATORY_CARE_PROVIDER_SITE_OTHER): Payer: Medicare Other | Admitting: Student

## 2016-07-02 ENCOUNTER — Encounter: Payer: Self-pay | Admitting: Student

## 2016-07-02 DIAGNOSIS — G8929 Other chronic pain: Secondary | ICD-10-CM | POA: Diagnosis not present

## 2016-07-02 DIAGNOSIS — M7121 Synovial cyst of popliteal space [Baker], right knee: Secondary | ICD-10-CM | POA: Diagnosis not present

## 2016-07-02 DIAGNOSIS — I10 Essential (primary) hypertension: Secondary | ICD-10-CM

## 2016-07-02 MED ORDER — OXYCODONE-ACETAMINOPHEN 10-325 MG PO TABS: 1.0000 | ORAL_TABLET | Freq: Two times a day (BID) | ORAL | 0 refills | Status: DC

## 2016-07-02 MED ORDER — LISINOPRIL-HYDROCHLOROTHIAZIDE 20-12.5 MG PO TABS: 1.0000 | ORAL_TABLET | Freq: Every day | ORAL | 3 refills | Status: DC

## 2016-07-02 MED ORDER — OMEPRAZOLE 40 MG PO CPDR: 40.0000 mg | DELAYED_RELEASE_CAPSULE | Freq: Every morning | ORAL | 3 refills | Status: DC

## 2016-07-02 NOTE — Assessment & Plan Note (Signed)
Mildly elevated BP, with no symptoms.  - Prinzide refilled

## 2016-07-02 NOTE — Assessment & Plan Note (Signed)
Prilosec refilled.

## 2016-07-02 NOTE — Assessment & Plan Note (Signed)
Patient still with pain from baker's cyst. He has denied volataren gel for this.  - will continue to follow and offer additional pain medication as needed - he does take percocet for DJD, will refill this today as he is due

## 2016-07-02 NOTE — Assessment & Plan Note (Signed)
[place] substance abuse database reviewed with no red flags. Percocet refilled for 2 months.

## 2016-07-02 NOTE — Patient Instructions (Signed)
Follow up as needed Return of your knee pain worsens Call the office at [phone] with questions or concerns

## 2016-07-02 NOTE — Progress Notes (Signed)
   Subjective:    Patient ID: Joel Donaldson, male    DOB: 02/26/1940, 76 y.o.   MRN: [id number]   CC: follow up leg pain  HPI: 76 y/o M with history of OA presents for follow up right leg pain  Right leg pain - [place] neg for DVT, most consistent with Bakers cyst - he states his pain has not improved - still mild swelling but stable  Bilateral Knee OA - still with significant knee pain despite several steroid injections - to follow with sports medcine next week - no worsening weakness, no falls  HTN - ran out of BP medications last week - no chest pain, SOB, headache  GERD - requests refill of prilosec  Smoking status reviewed  Review of Systems  Per HPI, else denies recent illness, fever   Objective:  BP (!) 152/90   Pulse 96   Temp 98.6 F (37 C) (Oral)   Wt 186 lb (84.4 kg)   SpO2 97%   BMI 26.69 kg/m  Vitals and nursing note reviewed  General: NAD Cardiac: RRR Respiratory: CTAB, normal effort Extremities: right knee with approximately 1 cm diameter mobile mass, no calf tenderness bilaterally Skin: warm and dry, no rashes noted Neuro: alert and oriented, no focal deficits   Assessment & Plan:    Baker's cyst of knee, right Patient still with pain from baker's cyst. He has denied volataren gel for this.  - will continue to follow and offer additional pain medication as needed - he does take percocet for DJD, will refill this today as he is due  Encounter for chronic pain management [place] substance abuse database reviewed with no red flags. Percocet refilled for 2 months.   HYPERTENSION, BENIGN SYSTEMIC Mildly elevated BP, with no symptoms.  - Prinzide refilled  GASTROESOPHAGEAL REFLUX, NO ESOPHAGITIS Prilosec refilled    Nikea Settle A. Lincoln Brigham MD, [place] [facility] Resident PGY-3 Pager [phone]

## 2016-08-27 ENCOUNTER — Ambulatory Visit (INDEPENDENT_AMBULATORY_CARE_PROVIDER_SITE_OTHER): Payer: Medicare Other | Admitting: Family Medicine

## 2016-08-27 ENCOUNTER — Encounter: Payer: Self-pay | Admitting: Family Medicine

## 2016-08-27 VITALS — BP 142/84 | HR 101 | Temp 98.2°F | Wt 180.0 lb

## 2016-08-27 DIAGNOSIS — G8929 Other chronic pain: Secondary | ICD-10-CM

## 2016-08-27 DIAGNOSIS — J449 Chronic obstructive pulmonary disease, unspecified: Secondary | ICD-10-CM

## 2016-08-27 MED ORDER — OXYCODONE-ACETAMINOPHEN 10-325 MG PO TABS: 1.0000 | ORAL_TABLET | Freq: Two times a day (BID) | ORAL | 0 refills | Status: DC

## 2016-08-27 MED ORDER — ALBUTEROL SULFATE HFA 108 (90 BASE) MCG/ACT IN AERS: INHALATION_SPRAY | RESPIRATORY_TRACT | 3 refills | Status: DC

## 2016-08-27 NOTE — Patient Instructions (Signed)
It was a pleasure to see you today! Thank you for choosing [facility] for your primary care. Joel Donaldson was seen for medication refill for chronic pain and COPD. Come back to clinic in two months to reassess these issues, or sooner if they worsen.   Best,  Marny Lowenstein, MD, [place] - PGY1 08/27/2016 9:06 AM

## 2016-08-27 NOTE — Assessment & Plan Note (Signed)
Chronic pain due to OA of knees bilat, djd, r baker's cyst. Refractory to multiple steroid injections. Recent work up by sports medicine did not reveal any additional need for therapy.  Pt knee pain/back pain well controlled on Percocet 10/325 mg bid prn. Cont current regimen. Have pt return in 2 mo to reassess.

## 2016-08-27 NOTE — Progress Notes (Signed)
    Subjective:  Joel Donaldson is a 76 y.o. male who presents to the [facility] today with a chief complaint of chronic pain.   HPI:  Chronic Pain:  Chronic: Stable. Patient has chronic pain from multiple arthritides including OA of knees, baker's cyst, and shoulder pain. His pain is constant, but significantly improves on his Percocet. Denies any bowel issues including diarrhea, constipation, abdominal pain.   COPD:  Chronic: Stable. Patient has COPD, managed with PRO-air. He says the heat can make his breathing worse, but that otherwise it is doing well with his inhaler. He denies any other respiratory issues including SOB, DOE.   ROS: Per HPI   Objective:  Physical Exam: BP (!) 142/84   Pulse (!) 101   Temp 98.2 F (36.8 C) (Oral)   Wt 180 lb (81.6 kg)   SpO2 97%   BMI 25.83 kg/m    Gen: NAD, resting comfortably Pulm: NWOB,  MSK: no edema, cyanosis  No results found for this or any previous visit (from the past 72 hour(s)).   Assessment/Plan:  Encounter for chronic pain management Chronic pain due to OA of knees bilat, djd, r baker's cyst. Refractory to multiple steroid injections. Recent work up by sports medicine did not reveal any additional need for therapy.  Pt knee pain/back pain well controlled on Percocet 10/325 mg bid prn. Cont current regimen. Have pt return in 2 mo to reassess.   COPD (chronic obstructive pulmonary disease) (HCC) COPD stable on Pro-air will refill as needed.    Marny Lowenstein, MD, [place] [facility]ILY [facility] RESIDENT - PGY1 08/27/2016 9:18 AM

## 2016-08-27 NOTE — Assessment & Plan Note (Signed)
COPD stable on Pro-air will refill as needed.

## 2016-09-28 ENCOUNTER — Other Ambulatory Visit: Payer: Self-pay | Admitting: *Deleted

## 2016-09-28 DIAGNOSIS — G8929 Other chronic pain: Secondary | ICD-10-CM

## 2016-09-28 NOTE — Telephone Encounter (Signed)
Called patient, but was unable to reach them. Please call patient that I will not be re-prescribing his medication at this time. He need to see in clinic to revaluate his pain plan and will likely need a pain clinic referral.

## 2016-09-28 NOTE — Telephone Encounter (Signed)
Patient called requesting a refill on pain medication. Patient made an appointment for earliest time 11/02/16 at 1:45 PM. Please call patient to let him know if the refill will be approved or not.  Phone number on file is correct.  Derl Barrow, RN

## 2016-10-02 NOTE — Telephone Encounter (Signed)
Pt called back about this, states he will be out tomorrow.  I can call and give him the message but before I do, please be aware that your next available is not until 9/28.  Are you willing to write script until then? [place], Salome Spotted, CMA

## 2016-10-02 NOTE — Telephone Encounter (Signed)
Patient called again requesting a refill on pain medication. Advised patient that medication will not be filled until seen in clinic. Patient stated he will out of medication tomorrow.  Precept with Dr. Gwendlyn Deutscher; ok to schedule on with team member. Appt scheduled for Friday 10/05/16.  Patient advised to keep his Sept appt as well.  Derl Barrow, RN

## 2016-10-04 ENCOUNTER — Emergency Department (HOSPITAL_COMMUNITY): Payer: Medicare Other

## 2016-10-04 ENCOUNTER — Observation Stay (HOSPITAL_COMMUNITY)
Admission: EM | Admit: 2016-10-04 | Discharge: 2016-10-05 | Disposition: A | Discharge status: Home / Self Care | Payer: Medicare Other | Attending: Family Medicine | Admitting: Family Medicine

## 2016-10-04 ENCOUNTER — Encounter (HOSPITAL_COMMUNITY): Payer: Self-pay | Admitting: Emergency Medicine

## 2016-10-04 DIAGNOSIS — R0789 Other chest pain: Principal | ICD-10-CM | POA: Insufficient documentation

## 2016-10-04 DIAGNOSIS — R079 Chest pain, unspecified: Secondary | ICD-10-CM | POA: Diagnosis present

## 2016-10-04 DIAGNOSIS — J449 Chronic obstructive pulmonary disease, unspecified: Secondary | ICD-10-CM | POA: Diagnosis not present

## 2016-10-04 DIAGNOSIS — Z79899 Other long term (current) drug therapy: Secondary | ICD-10-CM | POA: Diagnosis not present

## 2016-10-04 DIAGNOSIS — Z8546 Personal history of malignant neoplasm of prostate: Secondary | ICD-10-CM | POA: Diagnosis not present

## 2016-10-04 DIAGNOSIS — I10 Essential (primary) hypertension: Secondary | ICD-10-CM | POA: Diagnosis not present

## 2016-10-04 DIAGNOSIS — Z7982 Long term (current) use of aspirin: Secondary | ICD-10-CM | POA: Insufficient documentation

## 2016-10-04 DIAGNOSIS — G8929 Other chronic pain: Secondary | ICD-10-CM

## 2016-10-04 HISTORY — DX: Alcohol abuse, uncomplicated: F10.10

## 2016-10-04 LAB — CBC
HCT: 41 % (ref 39.0–52.0)
HEMOGLOBIN: 13.9 g/dL (ref 13.0–17.0)
MCH: 29.3 pg (ref 26.0–34.0)
MCHC: 33.9 g/dL (ref 30.0–36.0)
MCV: 86.3 fL (ref 78.0–100.0)
Platelets: 190 10*3/uL (ref 150–400)
RBC: 4.75 MIL/uL (ref 4.22–5.81)
RDW: 14.5 % (ref 11.5–15.5)
WBC: 2.9 10*3/uL — ABNORMAL LOW (ref 4.0–10.5)

## 2016-10-04 LAB — POCT I-STAT TROPONIN I: TROPONIN I, POC: 0.01 ng/mL (ref 0.00–0.08)

## 2016-10-04 LAB — BASIC METABOLIC PANEL
ANION GAP: 6 (ref 5–15)
BUN: 8 mg/dL (ref 6–20)
CALCIUM: 8.7 mg/dL — AB (ref 8.9–10.3)
CHLORIDE: 104 mmol/L (ref 101–111)
CO2: 28 mmol/L (ref 22–32)
Creatinine, Ser: 0.76 mg/dL (ref 0.61–1.24)
GFR calc Af Amer: >60 mL/min (ref >60)
GFR calc non Af Amer: >60 mL/min (ref >60)
GLUCOSE: 105 mg/dL — AB (ref 65–99)
Potassium: 4.1 mmol/L (ref 3.5–5.1)
Sodium: 138 mmol/L (ref 135–145)

## 2016-10-04 LAB — TROPONIN I

## 2016-10-04 MED ORDER — POLYETHYLENE GLYCOL 3350 17 G PO PACK: 17.0000 g | PACK | Freq: Every day | ORAL | Status: DC | PRN

## 2016-10-04 MED ORDER — FLUTICASONE PROPIONATE 50 MCG/ACT NA SUSP
2.0000 | Freq: Every day | NASAL | Status: DC
  Filled 2016-10-04: qty 16

## 2016-10-04 MED ORDER — NITROGLYCERIN 0.4 MG SL SUBL: 0.4000 mg | SUBLINGUAL_TABLET | SUBLINGUAL | Status: DC | PRN

## 2016-10-04 MED ORDER — ONDANSETRON HCL 4 MG/2ML IJ SOLN: 4.0000 mg | Freq: Four times a day (QID) | INTRAMUSCULAR | Status: DC | PRN

## 2016-10-04 MED ORDER — ASPIRIN EC 81 MG PO TBEC
81.0000 mg | DELAYED_RELEASE_TABLET | Freq: Every day | ORAL | Status: DC
  Administered 2016-10-05: 81 mg via ORAL
  Filled 2016-10-04: qty 1

## 2016-10-04 MED ORDER — DICLOFENAC SODIUM 1 % TD GEL: 2.0000 g | Freq: Four times a day (QID) | TRANSDERMAL | Status: DC | PRN

## 2016-10-04 MED ORDER — SODIUM CHLORIDE 0.9% FLUSH
3.0000 mL | Freq: Two times a day (BID) | INTRAVENOUS | Status: DC
  Administered 2016-10-04 – 2016-10-05 (×2): 3 mL via INTRAVENOUS

## 2016-10-04 MED ORDER — VITAMIN B-1 100 MG PO TABS
100.0000 mg | ORAL_TABLET | Freq: Every day | ORAL | Status: DC
  Administered 2016-10-05: 100 mg via ORAL
  Filled 2016-10-04: qty 1

## 2016-10-04 MED ORDER — ASPIRIN 81 MG PO CHEW
324.0000 mg | CHEWABLE_TABLET | Freq: Once | ORAL | Status: AC
  Administered 2016-10-04: 324 mg via ORAL
  Filled 2016-10-04: qty 4

## 2016-10-04 MED ORDER — HYDROCHLOROTHIAZIDE 12.5 MG PO CAPS
12.5000 mg | ORAL_CAPSULE | Freq: Every day | ORAL | Status: DC
  Administered 2016-10-05: 12.5 mg via ORAL
  Filled 2016-10-04: qty 1

## 2016-10-04 MED ORDER — LISINOPRIL 20 MG PO TABS
20.0000 mg | ORAL_TABLET | Freq: Every day | ORAL | Status: DC
  Administered 2016-10-05: 20 mg via ORAL
  Filled 2016-10-04: qty 1

## 2016-10-04 MED ORDER — MORPHINE SULFATE (PF) 2 MG/ML IV SOLN
4.0000 mg | Freq: Once | INTRAVENOUS | Status: AC
  Administered 2016-10-04: 4 mg via INTRAVENOUS
  Filled 2016-10-04: qty 2

## 2016-10-04 MED ORDER — ONDANSETRON HCL 4 MG/2ML IJ SOLN
4.0000 mg | Freq: Once | INTRAMUSCULAR | Status: AC
  Administered 2016-10-04: 4 mg via INTRAVENOUS
  Filled 2016-10-04: qty 2

## 2016-10-04 MED ORDER — MECLIZINE HCL 25 MG PO TABS: 25.0000 mg | ORAL_TABLET | Freq: Three times a day (TID) | ORAL | Status: DC | PRN

## 2016-10-04 MED ORDER — ALBUTEROL SULFATE (2.5 MG/3ML) 0.083% IN NEBU: 3.0000 mL | INHALATION_SOLUTION | RESPIRATORY_TRACT | Status: DC | PRN

## 2016-10-04 MED ORDER — ONDANSETRON HCL 4 MG PO TABS: 4.0000 mg | ORAL_TABLET | Freq: Four times a day (QID) | ORAL | Status: DC | PRN

## 2016-10-04 MED ORDER — FOLIC ACID 1 MG PO TABS
1.0000 mg | ORAL_TABLET | Freq: Every day | ORAL | Status: DC
  Administered 2016-10-05: 1 mg via ORAL
  Filled 2016-10-04: qty 1

## 2016-10-04 MED ORDER — PANTOPRAZOLE SODIUM 40 MG PO TBEC
40.0000 mg | DELAYED_RELEASE_TABLET | Freq: Every day | ORAL | Status: DC
  Administered 2016-10-05: 40 mg via ORAL
  Filled 2016-10-04: qty 1

## 2016-10-04 MED ORDER — ENOXAPARIN SODIUM 40 MG/0.4ML ~~LOC~~ SOLN
40.0000 mg | SUBCUTANEOUS | Status: DC
  Administered 2016-10-05: 40 mg via SUBCUTANEOUS
  Filled 2016-10-04: qty 0.4

## 2016-10-04 MED ORDER — ACETAMINOPHEN 650 MG RE SUPP: 650.0000 mg | Freq: Four times a day (QID) | RECTAL | Status: DC | PRN

## 2016-10-04 MED ORDER — ACETAMINOPHEN 325 MG PO TABS: 650.0000 mg | ORAL_TABLET | Freq: Four times a day (QID) | ORAL | Status: DC | PRN

## 2016-10-04 MED ORDER — LISINOPRIL-HYDROCHLOROTHIAZIDE 20-12.5 MG PO TABS: 1.0000 | ORAL_TABLET | Freq: Every day | ORAL | Status: DC

## 2016-10-04 NOTE — ED Notes (Signed)
[facility] here for transport.  

## 2016-10-04 NOTE — Discharge Summary (Signed)
[facility] Discharge Summary  Patient name: Joel Donaldson Medical record number: [id number] Date of birth: Mar 22, 1940 Age: 76 y.o. Gender: male Date of Admission: 10/04/2016  Date of Discharge: 10/05/2016 Admitting Physician: Zenia Resides, MD  Primary Care Provider: Bonnita Hollow, MD Consultants: Cardiology  Indication for Hospitalization: Chest pain  Discharge Diagnoses/Problem List:  Chest pain, likely musculoskeletal Alcohol Abuse HTN GERD COPD  Disposition: Home  Discharge Condition: Stable  Discharge Exam:  Gen: NAD, resting in bed CVS: rrr, no mrg Lungs: CTAB Extremities: no lower extremity edema, 5/5 strength throughout  Brief Hospital Course:  Patient was admitted for 1 day history of chest pain. Patient was normotensive on admission. Troponins negative. EKG showed no ST elevation. CXR showed no acute pulmonary processes. Pain did improve with NTG and morphine. Pt was seen by cardiology and ruled out for MI with low risk of ACS. On day of discharge, patients chest pain had resolved. He was in good health and vitals were stable.   Issues for Follow Up:  1. Cardiology: plan OP echocardiogram and follow up appointment. Cardiology said they would schedule the appointment and call the patient.  2. Chronic pain. D/c'd w/ 1/2 previous dosage. Pt has scheduled follow up w/ pcp to discuss further pain management.   Significant Procedures: None  Significant Labs and Imaging:   Recent Labs Lab 10/04/16 1018 10/05/16 0426  WBC 2.9* 3.7*  HGB 13.9 12.5*  HCT 41.0 38.6*  PLT 190 189    Recent Labs Lab 10/04/16 1018 10/05/16 0254  NA 138 137  K 4.1 4.5  CL 104 106  CO2 28 22  GLUCOSE 105* 98  BUN 8 5*  CREATININE 0.76 0.77  CALCIUM 8.7* 8.6*   Dg Chest 2 View  Result Date: 10/04/2016 CLINICAL DATA:  Mid chest pain.  No other complaints. EXAM: CHEST  2 VIEW COMPARISON:  06/17/2014 FINDINGS: The heart size and mediastinal  contours are within normal limits. Both lungs are clear. The visualized skeletal structures are unremarkable. IMPRESSION: No active cardiopulmonary disease. Electronically Signed   By: Kathreen Devoid   On: 10/04/2016 10:26    Results/Tests Pending at Time of Discharge: None  Discharge Medications:  Allergies as of 10/05/2016      Reactions   Tramadol Nausea Only   Pollen Extract Other (See Comments)   Sneezing      Medication List    STOP taking these medications   oxyCODONE-acetaminophen 10-325 MG tablet Commonly known as:  PERCOCET Replaced by:  oxyCODONE-acetaminophen 5-325 MG tablet     TAKE these medications   albuterol 108 (90 Base) MCG/ACT inhaler Commonly known as:  PROAIR HFA INHALE 2 PUFFS INTO THE LUNGS EVERY 6 HOURS AS NEEDED FOR WHEEZING OR SHORTNESS OF BREATH   aspirin EC 81 MG tablet Take 81 mg by mouth every morning.   diclofenac sodium 1 % Gel Commonly known as:  VOLTAREN Apply 2 g topically 4 (four) times daily as needed (pain).   ergocalciferol 50000 units capsule Commonly known as:  VITAMIN D2 Take 1 capsule (50,000 Units total) by mouth once a week. What changed:  when to take this Notes to patient:  Once a week   fluticasone 50 MCG/ACT nasal spray Commonly known as:  FLONASE Place 2 sprays into both nostrils daily. What changed:  when to take this  reasons to take this   lisinopril-hydrochlorothiazide 20-12.5 MG tablet Commonly known as:  PRINZIDE,ZESTORETIC Take 1 tablet by mouth daily.  meclizine 25 MG tablet Commonly known as:  ANTIVERT Take 1 tablet (25 mg total) by mouth 3 (three) times daily as needed for dizziness.   nitroGLYCERIN 0.4 MG SL tablet Commonly known as:  NITROSTAT Place 0.4 mg under the tongue every 5 (five) minutes as needed for chest pain (chest pain).   omeprazole 40 MG capsule Commonly known as:  PRILOSEC Take 1 capsule (40 mg total) by mouth every morning.   oxyCODONE-acetaminophen 5-325 MG tablet Commonly  known as:  ROXICET Take 1 tablet by mouth every 12 (twelve) hours as needed for severe pain. Replaces:  oxyCODONE-acetaminophen 10-325 MG tablet   polyethylene glycol packet Commonly known as:  MIRALAX / GLYCOLAX Take 17 g by mouth daily as needed for mild constipation.            Discharge Care Instructions        Start     Ordered   10/05/16 0000  polyethylene glycol (MIRALAX / GLYCOLAX) packet  Daily PRN     10/05/16 1220   10/05/16 0000  oxyCODONE-acetaminophen (ROXICET) 5-325 MG tablet  Every 12 hours PRN     10/05/16 1220   10/05/16 0000  Discharge instructions    Comments:  You were admitted to the hospital for chest pain. Testing showed no acute abnormalities of your heart. Please go to your scheduled PCP appointment for hospital follow up. Cardiology will contact you to schedule a separate appointment and to schedule yoru echocardiogram.   10/05/16 1220   10/05/16 0000  Activity as tolerated - No restrictions     10/05/16 1220   10/05/16 0000  Call MD for:  severe uncontrolled pain     10/05/16 1220   10/05/16 0000  Call MD for:  difficulty breathing, headache or visual disturbances     10/05/16 1220   10/05/16 0000  diclofenac sodium (VOLTAREN) 1 % GEL  4 times daily PRN     10/05/16 1220      Discharge Instructions: Please refer to Patient Instructions section of EMR for full details.  Patient was counseled important signs and symptoms that should prompt return to medical care, changes in medications, dietary instructions, activity restrictions, and follow up appointments.   Follow-Up Appointments: Follow-up Information    Belva Crome, MD Follow up.   Specialty:  Cardiology Why:  Dr. Thompson Caul office (now on [facility]) will call you to arrange your f/u appointment and heart ultrasound test to make sure your heart structure is normal. Contact information: [address] [place] [address] [phone]        Marjie Skiff, MD. Go on  10/09/2016.   Specialty:  Family Medicine Why:  Apt at 09:45. Please arrive 15 minutes early Contact information: [place] [address] [phone]           Bonnita Hollow, MD 10/05/2016, 4:48 PM PGY-1, [place]

## 2016-10-04 NOTE — ED Notes (Signed)
Patient transported to X-ray 

## 2016-10-04 NOTE — ED Provider Notes (Signed)
[facility] DEPT Provider Note   CSN: [id number] Arrival date & time: 10/04/16  0962     History   Chief Complaint Chief Complaint  Patient presents with  . Chest Pain   HPI   Blood pressure (!) 157/104, pulse 99, temperature 97.8 F (36.6 C), resp. rate 16, SpO2 100 %.  Joel Donaldson is a 76 y.o. male with past medical history significant for hypertension complaining of a soreness in the left chest which she noticed this morning upon waking, didn't wake him from sleep. His never fully left him throughout the day. He takes a daily low-dose aspirin, which she had this morning. Chest pain is associated with nausea with no vomiting, shortness of breath, diaphoresis. He states it is not exacerbated by movement, palpation, deep breathing however, he is unsure if it is exacerbated by exertion. Last stress test was over 5 years ago. He is a former smoker and quit 10-15 years ago. He denies any peripheral edema, orthopnea, PND, cough, fever, chills, palpitations or syncope.  PCP: family practice  Past Medical History:  Diagnosis Date  . Anxiety   . Arthritis    hands, back, knees  . Asthma   . Chest pain at rest 12/19/2011  . Depression   . GERD (gastroesophageal reflux disease)   . Glaucoma   . Heart murmur asymptomatic  . Hx of radioisotope therapy 07/26/11   Radiosactive Prostate Seed Implant/ I-125 Seeds  . Hypertension   . Nocturia   . Prostate cancer ([place]) dx'd 2016 or 2017   seed implants  . Rheumatoid arthritis ([place]) 10/10/2012  . Short of breath on exertion     Patient Active Problem List   Diagnosis Date Noted  . Chest pain 10/04/2016  . Baker's cyst of knee, right 06/21/2016  . Right leg swelling 06/14/2016  . Eustachian tube dysfunction 11/17/2015  . Back pain 09/19/2015  . Chronic pain 12/15/2014  . Syncope 12/07/2014  . Encounter for chronic pain management 08/23/2014  . Health care maintenance 05/20/2014  . Unspecified vitamin D deficiency 07/07/2013    . Resting tremor 11/10/2012  . Rheumatoid arthritis ([place]) 10/10/2012  . Osteoarthritis of both shoulders 02/15/2012  . Tendinopathy of rotator cuff 01/20/2012  . Preventative health care 12/03/2011  . Osteoarthritis of both knees 07/02/2011  . Alcohol use 05/01/2010  . INTENTION TREMOR 09/09/2008  . Prostate cancer ([place]) 07/01/2008  . HYPERLIPIDEMIA 06/24/2007  . COPD (chronic obstructive pulmonary disease) ([place]) 05/12/2007  . MIGRAINE, UNSPEC., W/O INTRACTABLE MIGRAINE 04/11/2006  . HYPERTENSION, BENIGN SYSTEMIC 04/11/2006  . GASTROESOPHAGEAL REFLUX, NO ESOPHAGITIS 04/11/2006    Past Surgical History:  Procedure Laterality Date  . CYSTOSCOPY  09/14/2011   Procedure: CYSTOSCOPY FLEXIBLE;  Surgeon: Hanley Ben, MD;  Location: [facility];  Service: Urology;  Laterality: N/A;  No seeds seen in the bladder  . LUMBAR FUSION  1995  . RADIOACTIVE SEED IMPLANT  09/14/2011   Procedure: RADIOACTIVE SEED IMPLANT;  Surgeon: Hanley Ben, MD;  Location: [facility];  Service: Urology;  Laterality: N/A;  Total number of seeds -    . SHOULDER SURGERY  YRS AGO   LEFT       Home Medications    Prior to Admission medications   Medication Sig Start Date End Date Taking? Authorizing Provider  albuterol (PROAIR HFA) 108 (90 Base) MCG/ACT inhaler INHALE 2 PUFFS INTO THE LUNGS EVERY 6 HOURS AS NEEDED FOR WHEEZING OR SHORTNESS OF BREATH 08/27/16  Yes Bonnita Hollow, MD  aspirin EC 81 MG tablet Take 81 mg by mouth every morning.    Yes [provider]  ergocalciferol (VITAMIN D2) 50000 UNITS capsule Take 1 capsule (50,000 Units total) by mouth once a week. Patient taking differently: Take 50,000 Units by mouth every Tuesday.  07/07/13  Yes Waldemar Dickens, MD  fluticasone [facility]) 50 MCG/ACT nasal spray Place 2 sprays into both nostrils daily. Patient taking differently: Place 2 sprays into both nostrils daily as needed for allergies.  11/17/15  Yes  Bacigalupo, Dionne Bucy, MD  lisinopril-hydrochlorothiazide (PRINZIDE,ZESTORETIC) 20-12.5 MG tablet Take 1 tablet by mouth daily. 07/02/16  Yes Haney, Alyssa A, MD  meclizine (ANTIVERT) 25 MG tablet Take 1 tablet (25 mg total) by mouth 3 (three) times daily as needed for dizziness. 02/17/16  Yes Lacretia Leigh, MD  nitroGLYCERIN (NITROSTAT) 0.4 MG SL tablet Place 0.4 mg under the tongue every 5 (five) minutes as needed for chest pain (chest pain).  12/20/11  Yes Street, Sharon Mt, MD  omeprazole (PRILOSEC) 40 MG capsule Take 1 capsule (40 mg total) by mouth every morning. 07/02/16  Yes Haney, Alyssa A, MD  oxyCODONE-acetaminophen (PERCOCET) 10-325 MG tablet Take 1 tablet by mouth 2 (two) times daily. 08/27/16  Yes Bonnita Hollow, MD    Family History Family History  Problem Relation Age of Onset  . Diabetes Mother     Social History Social History  Substance Use Topics  . Smoking status: Former Smoker    Packs/day: 2.00    Years: 50.00    Types: Cigarettes    Quit date: 10/13/2004  . Smokeless tobacco: Never Used     Comment: Still not smoking.   . Alcohol use 8.4 oz/week    14 Shots of liquor per week     Comment: occassional     Allergies   Tramadol and Pollen extract   Review of Systems Review of Systems  A complete review of systems was obtained and all systems are negative except as noted in the HPI and PMH.    Physical Exam Updated Vital Signs BP (!) 150/99 (BP Location: Left Arm)   Pulse 80   Temp 97.8 F (36.6 C)   Resp 20   SpO2 98%   Physical Exam  Constitutional: He is oriented to person, place, and time. He appears well-developed and well-nourished. No distress.  HENT:  Head: Normocephalic and atraumatic.  Mouth/Throat: Oropharynx is clear and moist.  Eyes: Pupils are equal, round, and reactive to light. Conjunctivae and EOM are normal.  Neck: Normal range of motion. No JVD present. No tracheal deviation present.  Cardiovascular: Normal rate, regular  rhythm and intact distal pulses.   Radial pulse equal bilaterally  Pulmonary/Chest: Effort normal and breath sounds normal. No stridor. No respiratory distress. He has no wheezes. He has no rales. He exhibits no tenderness.  Abdominal: Soft. He exhibits no distension and no mass. There is no tenderness. There is no rebound and no guarding.  Musculoskeletal: Normal range of motion. He exhibits no edema or tenderness.  No calf asymmetry, superficial collaterals, palpable cords, edema, Homans sign negative bilaterally.    Neurological: He is alert and oriented to person, place, and time.  Skin: Skin is warm. He is not diaphoretic.  Psychiatric: He has a normal mood and affect.  Nursing note and vitals reviewed.    ED Treatments / Results  Labs (all labs ordered are listed, but only abnormal results are displayed) Labs Reviewed  BASIC METABOLIC PANEL - Abnormal; Notable  for the following:       Result Value   Glucose, Bld 105 (*)    Calcium 8.7 (*)    All other components within normal limits  CBC - Abnormal; Notable for the following:    WBC 2.9 (*)    All other components within normal limits  I-STAT TROPONIN, ED  POCT I-STAT TROPONIN I    EKG  EKG Interpretation None       Radiology Dg Chest 2 View  Result Date: 10/04/2016 CLINICAL DATA:  Mid chest pain.  No other complaints. EXAM: CHEST  2 VIEW COMPARISON:  06/17/2014 FINDINGS: The heart size and mediastinal contours are within normal limits. Both lungs are clear. The visualized skeletal structures are unremarkable. IMPRESSION: No active cardiopulmonary disease. Electronically Signed   By: Kathreen Devoid   On: 10/04/2016 10:26    Procedures Procedures (including critical care time)  Medications Ordered in ED Medications  aspirin chewable tablet 324 mg (324 mg Oral Given 10/04/16 1404)  morphine 2 MG/ML injection 4 mg (4 mg Intravenous Given 10/04/16 1423)  ondansetron (ZOFRAN) injection 4 mg (4 mg Intravenous Given  10/04/16 1423)     Initial Impression / Assessment and Plan / ED Course  I have reviewed the triage vital signs and the nursing notes.  Pertinent labs & imaging results that were available during my care of the patient were reviewed by me and considered in my medical decision making (see chart for details).     Vitals:   10/04/16 1003 10/04/16 1424 10/04/16 1530  BP: (!) 157/104 (!) 157/101 (!) 150/99  Pulse: 99 79 80  Resp: 16 16 20   Temp: 97.8 F (36.6 C)    SpO2: 100% 100% 98%    Medications  aspirin chewable tablet 324 mg (324 mg Oral Given 10/04/16 1404)  morphine 2 MG/ML injection 4 mg (4 mg Intravenous Given 10/04/16 1423)  ondansetron (ZOFRAN) injection 4 mg (4 mg Intravenous Given 10/04/16 1423)    Joel Donaldson is 76 y.o. male presenting with Left-sided nonradiating chest pain with associated nausea onset this a.m. Patient with no acute changes on EKG, negative troponin, this patient is moderate risk by heart score need admission for chest pain rule out. Full dose aspirin administered and patient given morphine for pain control.  D/w daily practice resident Dr. Grandville Silos who accepts admission with attending Dr. Andria Frames.  Final Clinical Impressions(s) / ED Diagnoses   Final diagnoses:  Chest pain, unspecified type    New Prescriptions New Prescriptions   No medications on file     Waynetta Pean 10/04/16 [place]    Duffy Bruce, MD 10/05/16 1213

## 2016-10-04 NOTE — Progress Notes (Signed)
Family Medicine Teaching Service Transfer Acceptance Note  Brief summary of reason for admission/transfer: chest pain Time accepted for transfer: 2:11 PM ED provider with whom patient discussed: Dr. Duffy Bruce  Please note that patients transferring to [facility] under the [facility] Medicine Teaching Service must arrive at [facility] within two hours of acceptance for transfer.  Bonnita Hollow, MD PGY-1, Family Medicine Teaching Service Service Pager [phone]

## 2016-10-04 NOTE — ED Notes (Signed)
[facility] called. 

## 2016-10-04 NOTE — H&P (Signed)
[facility] Admission History and Physical Service Pager: [phone]  Patient name: Joel Donaldson Medical record number: [id number] Date of birth: 1940/09/03 Age: 76 y.o. Gender: male  Primary Care Provider: Bonnita Hollow, MD Consultants: None Code Status: Limited (no intubation but yes CPR and chest compressions)  Chief Complaint: Chest pain  Assessment and Plan: Joel Donaldson is a 76 y.o. male presenting with 1 day of chest pain . PMH is significant for HLD, intential tremor, Migraine, HTN, COPD, GERD, Prostate Cancer, Chronic Pain, OA of Knees bilaterally, RA, anxiety  Chest Pain: Vitals stable, normotensive for age on admission. Sig Labs on admission, Leukopenia 2.9, troponin 0.01, CXR No active cardiopulmonary disease. EKG showed LAH, no ST changes, did have significant wandering in leads. Received 4 mg morphine in ED which provided relief. Ddx: ACS,  Musculoskeletal, underlying pneumonia. PNA not likely do to nl chest xray, no cough congestion. ACS must be ruled out, but less likely due to nl troponin and nl ekg. MSK most likely given reproducible pain on exam and no signs of acs. Could also be GERD as he has a hx of this. Heart score of 4.  - Admit to family medicine teaching service, telemetry, attending Dr. Andria Frames - Vitals per floor protocol - cardiac monitoring - Trend troponins - tylenol prn - ASA - Nitro - Zofran PRN - Voltaren gel PRN  - Repeat EKG now as 1st one showed lead wandering  Alcohol Use: Endorses drinking 1 pt of vodka 2-3 days. Baseline tremor, likely ET - CIWA protocol - folic acid - thiamine  HTN. Chronic, normotensive on admission - continue home lisinopril HCTZ 20-25mg   GERD. Chronic - Protonix  COPD: Normal respiratory exam - Continue home albuterol  Chronic Pain: Has multiple joint pains and hx of OA and RA. Has percocet on home med list but states he has been out for a couple months per her report -   Voltaren gel PRN  FEN/GI: SLIV, heart healthy diet Prophylaxis: LMWH  Disposition: Admit to FPTS, telemetry, attending Dr. Andria Frames  History of Present Illness:  Joel Donaldson is a 76 y.o. male presenting with chest pain.   Patient states that he started to develop left dull "sore" chest pain around 6AM, it woke him up from sleep. He took  his acid reflux and a couple aspirins (except no blood pressure pills). He thought the pain would go away after the GERD medications but it didn't so that's what prompted him to come to the hospital. Additionally he had some nausea but no vomiting. The chest pain has been constant and rates it a 5/10. He notes that the pain has died down now after being in the ED, it is now a 3/10. He states he has had chest pain like this before, years ago, and he went to Dr. Tamala Julian (cardiologist) but he was told he didn't have any heart problems. No recent illnesses. Admits to a mild headache as well. Admits to some shakiness and nervous.   Denies any diaphoresis, shortness of breath, abdominal pain, vomiting, diarrhea, difficulty voiding .   Review Of Systems: Per HPI with the following additions: see HPI  ROS  Patient Active Problem List   Diagnosis Date Noted  . Chest pain 10/04/2016  . Baker's cyst of knee, right 06/21/2016  . Right leg swelling 06/14/2016  . Eustachian tube dysfunction 11/17/2015  . Back pain 09/19/2015  . Chronic pain 12/15/2014  . Syncope 12/07/2014  . Encounter for chronic  pain management 08/23/2014  . Health care maintenance 05/20/2014  . Unspecified vitamin D deficiency 07/07/2013  . Resting tremor 11/10/2012  . Rheumatoid arthritis ([place]) 10/10/2012  . Osteoarthritis of both shoulders 02/15/2012  . Tendinopathy of rotator cuff 01/20/2012  . Preventative health care 12/03/2011  . Osteoarthritis of both knees 07/02/2011  . Alcohol use 05/01/2010  . INTENTION TREMOR 09/09/2008  . Prostate cancer ([place]) 07/01/2008  . HYPERLIPIDEMIA  06/24/2007  . COPD (chronic obstructive pulmonary disease) ([place]) 05/12/2007  . MIGRAINE, UNSPEC., W/O INTRACTABLE MIGRAINE 04/11/2006  . HYPERTENSION, BENIGN SYSTEMIC 04/11/2006  . GASTROESOPHAGEAL REFLUX, NO ESOPHAGITIS 04/11/2006    Past Medical History: Past Medical History:  Diagnosis Date  . Anxiety   . Arthritis    hands, back, knees  . Asthma   . Chest pain at rest 12/19/2011  . Depression   . GERD (gastroesophageal reflux disease)   . Glaucoma   . Heart murmur asymptomatic  . Hx of radioisotope therapy 07/26/11   Radiosactive Prostate Seed Implant/ I-125 Seeds  . Hypertension   . Nocturia   . Prostate cancer ([place]) dx'd 2016 or 2017   seed implants  . Rheumatoid arthritis ([place]) 10/10/2012  . Short of breath on exertion     Past Surgical History: Past Surgical History:  Procedure Laterality Date  . CYSTOSCOPY  09/14/2011   Procedure: CYSTOSCOPY FLEXIBLE;  Surgeon: Hanley Ben, MD;  Location: [facility];  Service: Urology;  Laterality: N/A;  No seeds seen in the bladder  . LUMBAR FUSION  1995  . RADIOACTIVE SEED IMPLANT  09/14/2011   Procedure: RADIOACTIVE SEED IMPLANT;  Surgeon: Hanley Ben, MD;  Location: [facility];  Service: Urology;  Laterality: N/A;  Total number of seeds -    . SHOULDER SURGERY  YRS AGO   LEFT    Social History: Social History  Substance Use Topics  . Smoking status: Former Smoker    Packs/day: 2.00    Years: 50.00    Types: Cigarettes    Quit date: 10/13/2004  . Smokeless tobacco: Never Used     Comment: Still not smoking.   . Alcohol use 8.4 oz/week    14 Shots of liquor per week     Comment: occassional   Additional social history: Patient lives at home with his granddaughter who helps him but patient states he is mostly independent. He is retired. Does not currently smoke cigarettes. He previously smoked 2 PPD but quit 15 years ago. Denies any drug use. Admits to drinking 1 pint of Absolut vodka/2  days. His last drink was yesterday. No alcohol today.   Please also refer to relevant sections of EMR.  Family History: Family History  Problem Relation Age of Onset  . Diabetes Mother     Allergies and Medications: Allergies  Allergen Reactions  . Tramadol Nausea Only  . Pollen Extract Other (See Comments)    Sneezing   No current facility-administered medications on file prior to encounter.    Current Outpatient Prescriptions on File Prior to Encounter  Medication Sig Dispense Refill  . albuterol (PROAIR HFA) 108 (90 Base) MCG/ACT inhaler INHALE 2 PUFFS INTO THE LUNGS EVERY 6 HOURS AS NEEDED FOR WHEEZING OR SHORTNESS OF BREATH 8.5 g 3  . aspirin EC 81 MG tablet Take 81 mg by mouth every morning.     . ergocalciferol (VITAMIN D2) 50000 UNITS capsule Take 1 capsule (50,000 Units total) by mouth once a week. (Patient taking differently: Take 50,000 Units by  mouth every Tuesday. ) 8 capsule 0  . fluticasone (FLONASE) 50 MCG/ACT nasal spray Place 2 sprays into both nostrils daily. (Patient taking differently: Place 2 sprays into both nostrils daily as needed for allergies. ) 16 g 6  . lisinopril-hydrochlorothiazide (PRINZIDE,ZESTORETIC) 20-12.5 MG tablet Take 1 tablet by mouth daily. 90 tablet 3  . meclizine (ANTIVERT) 25 MG tablet Take 1 tablet (25 mg total) by mouth 3 (three) times daily as needed for dizziness. 30 tablet 0  . nitroGLYCERIN (NITROSTAT) 0.4 MG SL tablet Place 0.4 mg under the tongue every 5 (five) minutes as needed for chest pain (chest pain).     Marland Kitchen omeprazole (PRILOSEC) 40 MG capsule Take 1 capsule (40 mg total) by mouth every morning. 90 capsule 3  . oxyCODONE-acetaminophen (PERCOCET) 10-325 MG tablet Take 1 tablet by mouth 2 (two) times daily. 60 tablet 0    Objective: BP (!) 135/92 (BP Location: Right Arm)   Pulse 91   Temp 98.1 F (36.7 C) (Oral)   Resp 16   Ht 5\' 10"  (1.778 m)   Wt 175 lb 1.6 oz (79.4 kg)   SpO2 100%   BMI 25.12 kg/m  Exam: General:  NAD, resting in bed Eyes: PERRL, EOMI ENTM: moist mucus membranes, tongue midline, no lesions Neck: supple, nontender Cardiovascular: rrr, no mrg, chest wall tender over heart Respiratory: ctab, nl work of breathing Gastrointestinal: soft, nontender, nondistended MSK: 5/5 strength throughout, arthric appearing knees  Derm: warm, intact Neuro: CN II-XII, AxO3, resting tremor in upper extremities bilaterally Psych: cooperative, appropriate  Labs and Imaging: CBC BMET   Recent Labs Lab 10/04/16 1018  WBC 2.9*  HGB 13.9  HCT 41.0  PLT 190    Recent Labs Lab 10/04/16 1018  NA 138  K 4.1  CL 104  CO2 28  BUN 8  CREATININE 0.76  GLUCOSE 105*  CALCIUM 8.7*     Dg Chest 2 View  Result Date: 10/04/2016 CLINICAL DATA:  Mid chest pain.  No other complaints. EXAM: CHEST  2 VIEW COMPARISON:  06/17/2014 FINDINGS: The heart size and mediastinal contours are within normal limits. Both lungs are clear. The visualized skeletal structures are unremarkable. IMPRESSION: No active cardiopulmonary disease. Electronically Signed   By: Kathreen Devoid   On: 10/04/2016 10:26    Bonnita Hollow, MD 10/04/2016, 6:00 PM PGY-1, [place] Intern pager: [phone], text pages welcome  I have separately seen and examined the patient. I have discussed the findings and exam with Dr. Grandville Silos and agree with the above note.  My changes/additions are outlined in BLUE.   Smitty Cords, MD [place], PGY-3

## 2016-10-04 NOTE — ED Notes (Signed)
Pt reports he has nitroglycerin at home but was unable to take it because it had expired in 2014

## 2016-10-04 NOTE — ED Triage Notes (Signed)
Per pt, states left sided chest pain that stated when he woke up this am-no other symptosm

## 2016-10-05 ENCOUNTER — Encounter (HOSPITAL_COMMUNITY): Payer: Self-pay | Admitting: Physician Assistant

## 2016-10-05 ENCOUNTER — Ambulatory Visit: Payer: Medicare Other | Admitting: Family Medicine

## 2016-10-05 ENCOUNTER — Other Ambulatory Visit: Payer: Self-pay | Admitting: Physician Assistant

## 2016-10-05 ENCOUNTER — Other Ambulatory Visit: Payer: Self-pay | Admitting: Family Medicine

## 2016-10-05 DIAGNOSIS — R079 Chest pain, unspecified: Secondary | ICD-10-CM | POA: Diagnosis not present

## 2016-10-05 DIAGNOSIS — R0602 Shortness of breath: Secondary | ICD-10-CM

## 2016-10-05 DIAGNOSIS — R0789 Other chest pain: Secondary | ICD-10-CM

## 2016-10-05 DIAGNOSIS — I10 Essential (primary) hypertension: Secondary | ICD-10-CM

## 2016-10-05 LAB — CBC
HEMATOCRIT: 38.6 % — AB (ref 39.0–52.0)
Hemoglobin: 12.5 g/dL — ABNORMAL LOW (ref 13.0–17.0)
MCH: 28 pg (ref 26.0–34.0)
MCHC: 32.4 g/dL (ref 30.0–36.0)
MCV: 86.4 fL (ref 78.0–100.0)
Platelets: 189 10*3/uL (ref 150–400)
RBC: 4.47 MIL/uL (ref 4.22–5.81)
RDW: 14.4 % (ref 11.5–15.5)
WBC: 3.7 10*3/uL — ABNORMAL LOW (ref 4.0–10.5)

## 2016-10-05 LAB — BASIC METABOLIC PANEL
ANION GAP: 9 (ref 5–15)
BUN: 5 mg/dL — ABNORMAL LOW (ref 6–20)
CALCIUM: 8.6 mg/dL — AB (ref 8.9–10.3)
CO2: 22 mmol/L (ref 22–32)
Chloride: 106 mmol/L (ref 101–111)
Creatinine, Ser: 0.77 mg/dL (ref 0.61–1.24)
GFR calc Af Amer: >60 mL/min (ref >60)
GFR calc non Af Amer: >60 mL/min (ref >60)
GLUCOSE: 98 mg/dL (ref 65–99)
POTASSIUM: 4.5 mmol/L (ref 3.5–5.1)
Sodium: 137 mmol/L (ref 135–145)

## 2016-10-05 LAB — TROPONIN I: Troponin I: <0.03 ng/mL (ref <0.03)

## 2016-10-05 MED ORDER — POLYETHYLENE GLYCOL 3350 17 G PO PACK: 17.0000 g | PACK | Freq: Every day | ORAL | 0 refills | Status: DC | PRN

## 2016-10-05 MED ORDER — DICLOFENAC SODIUM 1 % TD GEL: 2.0000 g | Freq: Four times a day (QID) | TRANSDERMAL | 0 refills | Status: DC | PRN

## 2016-10-05 MED ORDER — OXYCODONE-ACETAMINOPHEN 5-325 MG PO TABS: 1.0000 | ORAL_TABLET | Freq: Two times a day (BID) | ORAL | 0 refills | Status: DC | PRN

## 2016-10-05 NOTE — Consult Note (Signed)
           [facility] Primary Care Navigator  10/05/2016  LANDER ESLICK 1940-09-14 [id number]   Wentto seepatient at the bedsideto identify possible discharge needs but he was already discharged home per staff report.   Primary care provider's officeis listed as doing transition of care.   For questions, please contact:  Dannielle Huh, BSN, RN- [facility] Primary Care Navigator  Telephone: [phone] [place]

## 2016-10-05 NOTE — Progress Notes (Signed)
Pt seen/examined with Dr. Radford Pax - plan OP echocardiogram and f/u. No further IP workup necessary. I have sent a message to our [facility] requesting thse arrangements, and our office will call the patient with this information.  Dayna Dunn PA-C

## 2016-10-05 NOTE — Consult Note (Signed)
Cardiology Consultation:   Patient ID: KENO CARAWAY; [id number]; 07/15/1940   Admit date: 10/04/2016 Date of Consult: 10/05/2016  Primary Care Provider: Bonnita Hollow, MD Primary Cardiologist: Pt reports very remotely seen by Dr. Tamala Julian; being seen by Dr. Radford Pax today  Chief Complaint: chest pain  Patient Profile:   DARRIUS MONTANO is a 76 y.o. male with a hx of HTN, GERD, alcohol abuse (1/2 pint liquor daily), arthritis with chronic pain, prostate CA, depression who is being seen today for the evaluation of chest pain at the request of Dr. Vladimir Creeks (identified by charge as chest pain unit consult).  History of Present Illness:   Mr. Masoud has no prior cardiac history but reports being seen by Dr. Tamala Julian very remotely for similar CP at which time workup was negative. Review of Epic shows negative nuclear stress test in 2006 with EF 59% as well as echo showing EF 55% and no RWMA; mildly calcified aortic valve. He has been in his USOH lately, which at baseline is somewhat debilitated due to chronic pain from arthritis. He takes Percocet regularly. He also drinks 1/2 pint of alcohol a day. He states he hasn't particularly contemplated cutting down because he feels he would be able to stop drinking easily if he tried. However, he also states that if he runs out of his Percocet, that's when he turns to drinking more. Yesterday he was laying in bed already awake around 6am when he noticed left sided dull CP without radiation. No SOB, nausea, vomiting, diaphoresis or palpitations. He took acid reflux meds, aspirin, got up and showered and the pain persisted. It was not worse with exertion or inspiration. He did notice the "soreness" increased if he "mashed on that side." Due to persistent discomfort x 3-4 hours (constant without interruption) he presented to the ER. He states pain finally went away as soon as he got an injection of a medication - appears he got morphine. He has been pain free  since. Denies any recent exertional symptoms. Workup reveals neg troponin x 3, Hgb 13.9->12.5, CXR NAD, telemetry benign, EKG unremarkable.  The only family member he reports who had heart disease was his father who had bypass and pacemaker and lived to be 48.  Past Medical History:  Diagnosis Date  . Alcohol abuse   . Anxiety   . Arthritis    hands, back, knees  . Asthma   . Depression   . GERD (gastroesophageal reflux disease)   . Glaucoma   . Heart murmur asymptomatic  . Hx of radioisotope therapy 07/26/11   Radiosactive Prostate Seed Implant/ I-125 Seeds  . Hypertension   . Prostate cancer ([place]) dx'd 2016 or 2017   seed implants  . Rheumatoid arthritis ([place]) 10/10/2012    Past Surgical History:  Procedure Laterality Date  . CYSTOSCOPY  09/14/2011   Procedure: CYSTOSCOPY FLEXIBLE;  Surgeon: Hanley Ben, MD;  Location: [facility];  Service: Urology;  Laterality: N/A;  No seeds seen in the bladder  . LUMBAR FUSION  1995  . RADIOACTIVE SEED IMPLANT  09/14/2011   Procedure: RADIOACTIVE SEED IMPLANT;  Surgeon: Hanley Ben, MD;  Location: [place];  Service: Urology;  Laterality: N/A;  Total number of seeds -    . SHOULDER SURGERY  YRS AGO   LEFT     Inpatient Medications: Scheduled Meds: . aspirin EC  81 mg Oral Daily  . enoxaparin (LOVENOX) injection  40 mg Subcutaneous Q24H  . fluticasone  2 spray Each Nare Daily  . folic acid  1 mg Oral Daily  . lisinopril  20 mg Oral Daily   And  . hydrochlorothiazide  12.5 mg Oral Daily  . pantoprazole  40 mg Oral Daily  . sodium chloride flush  3 mL Intravenous Q12H  . thiamine  100 mg Oral Daily   Continuous Infusions:  PRN Meds: acetaminophen **OR** acetaminophen, albuterol, diclofenac sodium, meclizine, nitroGLYCERIN, ondansetron **OR** ondansetron (ZOFRAN) IV, polyethylene glycol  Allergies:    Allergies  Allergen Reactions  . Tramadol Nausea Only  . Pollen Extract Other (See  Comments)    Sneezing    Social History:   Social History   Social History  . Marital status: Legally Separated    Spouse name: Deneise Lever  . Number of children: 1  . Years of education: 95   Occupational History  . Retired Retired   Social History Main Topics  . Smoking status: Former Smoker    Packs/day: 2.00    Types: Cigarettes    Quit date: 10/13/2004  . Smokeless tobacco: Never Used     Comment: Reported smoking for 20 years in 09/2016  . Alcohol use 8.4 oz/week    14 Shots of liquor per week     Comment: 1/2 pint of liquor daily  . Drug use: No  . Sexual activity: Not on file   Other Topics Concern  . Not on file   Social History Narrative   Lives 1-story home with granddaughter and great-granddaughter.    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       Health Care POA:    Emergency Contact: wife, Creighton Longley (c) [phone]   End of Life Plan:    Who lives with you: wife and granddaughter   Any pets: 2 dogs, Precious and Spike   Diet: Pt has a varied diet of protein, starch, vegetables.  Pt does not have any kind of modified diet, eats what he likes.   Exercise: Pt does not have regular exercise routine.   Seatbelts: Pt reports wearing seatbelt when in vehicles.    Sun Exposure/Protection:    Hobbies: fishing, hunting           Family History:   The patient's family history includes Diabetes in his mother; Heart disease in his father.  ROS:  Please see the history of present illness.  All other ROS reviewed and negative.     Physical Exam/Data:   Vitals:   10/04/16 1629 10/04/16 1945 10/05/16 0005 10/05/16 0400  BP: (!) 135/92 138/81 115/73 (!) 142/92  Pulse: 91 68 67 65  Resp: 16 16 18 16   Temp: 98.1 F (36.7 C) 98.3 F (36.8 C) 98 F (36.7 C) 97.6 F (36.4 C)  TempSrc: Oral Oral Oral Oral  SpO2: 100% 100% 98% 100%  Weight: 175 lb 1.6 oz (79.4 kg)   174 lb 3.2 oz (79 kg)  Height: 5\' 10"   (1.778 m)      No intake or output data in the 24 hours ending 10/05/16 0708 Filed Weights   10/04/16 1629 10/05/16 0400  Weight: 175 lb 1.6 oz (79.4 kg) 174 lb 3.2 oz (79 kg)   Body mass index is 25 kg/m.  General: Well developed, well nourished AAM, in no acute distress. Head: Normocephalic, atraumatic, sclera non-icteric, no xanthomas, nares are without discharge.  Neck: Negative for carotid bruits. JVD not elevated. Lungs: Clear bilaterally to auscultation without wheezes, rales, or  rhonchi. Breathing is unlabored. Heart: RRR with S1 S2. No murmurs, rubs, or gallops appreciated. Abdomen: Soft, non-tender, non-distended with normoactive bowel sounds. No hepatomegaly. No rebound/guarding. No obvious abdominal masses. Msk:  Strength and tone appear normal for age. Extremities: No clubbing or cyanosis. No edema.  Distal pedal pulses are 2+ and equal bilaterally. Neuro: Alert and oriented X 3. No facial asymmetry. No focal deficit. Moves all extremities spontaneously. Psych:  Responds to questions appropriately with a normal affect.  EKG:  The EKG was personally reviewed - first tracing NSR with baseline wander but no apparent acute changes, f/u EKG nonacute as well - NSR  Relevant CV Studies: See above  Laboratory Data:  Chemistry Recent Labs Lab 10/04/16 1018 10/05/16 0254  NA 138 137  K 4.1 4.5  CL 104 106  CO2 28 22  GLUCOSE 105* 98  BUN 8 5*  CREATININE 0.76 0.77  CALCIUM 8.7* 8.6*  GFRNONAA >60 >60  GFRAA >60 >60  ANIONGAP 6 9     Hematology Recent Labs Lab 10/04/16 1018 10/05/16 0426  WBC 2.9* 3.7*  RBC 4.75 4.47  HGB 13.9 12.5*  HCT 41.0 38.6*  MCV 86.3 86.4  MCH 29.3 28.0  MCHC 33.9 32.4  RDW 14.5 14.4  PLT 190 189   Cardiac Enzymes Recent Labs Lab 10/04/16 1929 10/05/16 0254  TROPONINI <0.03 <0.03    Recent Labs Lab 10/04/16 1028  TROPIPOC 0.01    BNPNo results for input(s): BNP, PROBNP in the last 168 hours.  DDimer No results for  input(s): DDIMER in the last 168 hours.  Radiology/Studies:  Dg Chest 2 View  Result Date: 10/04/2016 CLINICAL DATA:  Mid chest pain.  No other complaints. EXAM: CHEST  2 VIEW COMPARISON:  06/17/2014 FINDINGS: The heart size and mediastinal contours are within normal limits. Both lungs are clear. The visualized skeletal structures are unremarkable. IMPRESSION: No active cardiopulmonary disease. Electronically Signed   By: Kathreen Devoid   On: 10/04/2016 10:26    Assessment and Plan:   1. Chest pain, atypical - despite 3-4 hours of constant pain, troponins have remained negative and EKG unremarkable. Chest wall was TTP during episode and resolved with morphine. He's remained chest pain free. Cardiac risk factors include HTN, prior tobacco abuse and family history of CAD in his father (who lived to be 40). I suspect musculoskeletal chest pain. I do not suspect he will need further inpatient workup of this. Will review further with Dr. Radford Pax. May be prudent to arrange OP f/u and if he has had more CP, consider outpatient nuclear stress test - otherwise would follow conservatively given atypical nature. He does report rare dyspnea. Given EtOH may be prudent to consider OP echo at some point.  2. History of alcohol abuse - counseled on the toxic nature of chronic alcohol use with regards to the heart. I am not convinced that he wants to cut down.  3. Essential HTN - BP variable this admission from 115/73 to 157/104. Further management per primary team.  Signed, Charlie Pitter, PA-C  10/05/2016 7:08 AM

## 2016-10-09 ENCOUNTER — Encounter: Payer: Self-pay | Admitting: Family Medicine

## 2016-10-09 ENCOUNTER — Ambulatory Visit (INDEPENDENT_AMBULATORY_CARE_PROVIDER_SITE_OTHER): Payer: Medicare Other | Admitting: Family Medicine

## 2016-10-09 VITALS — BP 162/88 | HR 107 | Temp 98.1°F | Wt 175.0 lb

## 2016-10-09 DIAGNOSIS — M19012 Primary osteoarthritis, left shoulder: Secondary | ICD-10-CM | POA: Diagnosis not present

## 2016-10-09 DIAGNOSIS — M19011 Primary osteoarthritis, right shoulder: Secondary | ICD-10-CM

## 2016-10-09 DIAGNOSIS — M170 Bilateral primary osteoarthritis of knee: Secondary | ICD-10-CM | POA: Diagnosis not present

## 2016-10-09 MED ORDER — OXYCODONE-ACETAMINOPHEN 10-325 MG PO TABS: 1.0000 | ORAL_TABLET | Freq: Two times a day (BID) | ORAL | 0 refills | Status: DC

## 2016-10-09 NOTE — Progress Notes (Signed)
   Subjective:    Patient ID: Joel Donaldson, male    DOB: 1940-06-01, 76 y.o.   MRN: [id number]   CC: Hospital discharge follow up  HPI: Patient is 76 yo who presents today for a hospital follow up for chest pain. Patient reports that he has been doing since discharge, no chest pain, SOB, abdominal pain, nausea, vomiting. Patient brought his percocet prescription given to him at discharge of 5-325 mg and reports that his has been taking 10-325 mg for many years for his knees and shoulders arthritis. Patient received a call from cardiology and made an appointment for 8/29 for an echocardiogram.  Smoking status reviewed   ROS: all other systems were reviewed and are negative other than in the HPI   Past Medical History:  Diagnosis Date  . Alcohol abuse   . Anxiety   . Arthritis    hands, back, knees  . Asthma   . Depression   . GERD (gastroesophageal reflux disease)   . Glaucoma   . Heart murmur asymptomatic  . Hx of radioisotope therapy 07/26/11   Radiosactive Prostate Seed Implant/ I-125 Seeds  . Hypertension   . Prostate cancer ([place]) dx'd 2016 or 2017   seed implants  . Rheumatoid arthritis ([place]) 10/10/2012    Past Surgical History:  Procedure Laterality Date  . CYSTOSCOPY  09/14/2011   Procedure: CYSTOSCOPY FLEXIBLE;  Surgeon: Hanley Ben, MD;  [facility];  Service: Urology;  Laterality: N/A;  No seeds seen in the bladder  . LUMBAR FUSION  1995  . RADIOACTIVE SEED IMPLANT  09/14/2011   Procedure: RADIOACTIVE SEED IMPLANT;  Surgeon: Hanley Ben, MD;  Location: [place];  Service: Urology;  Laterality: N/A;  Total number of seeds -    . SHOULDER SURGERY  YRS AGO   LEFT    Past medical history, surgical, family, and social history reviewed and updated in the EMR as appropriate.  Objective:  BP (!) 162/88   Pulse (!) 107   Temp 98.1 F (36.7 C) (Oral)   Wt 175 lb (79.4 kg)   SpO2 97%   BMI 25.11 kg/m   Vitals and  nursing note reviewed  General: NAD, pleasant, able to participate in exam Cardiac: RRR, normal heart sounds, no murmurs. 2+ radial and PT pulses bilaterally Respiratory: CTAB, normal effort, No wheezes, rales or rhonchi Abdomen: soft, nontender, nondistended, no hepatic or splenomegaly, +BS Extremities: no edema or cyanosis. WWP. Skin: warm and dry, no rashes noted Neuro: alert and oriented x4, no focal deficits Psych: Normal affect and mood   Assessment & Plan:   #Hospital discharge follow up for chest pain Patient presents today for follow up after recent hospitalization for chest pain. Patient is doing well except for bilateral knees and shoulders pain which is chronic. Patient did not use percocet because it is the wrong dosage and it does not work for him. Patient has an appointment with Cardiology schedule for 8/29. Will refill percocet. --Refill percocet 10-325 mg bid  Marjie Skiff, MD [place] PGY-2

## 2016-10-10 ENCOUNTER — Other Ambulatory Visit: Payer: Self-pay

## 2016-10-10 ENCOUNTER — Ambulatory Visit (HOSPITAL_COMMUNITY): Payer: Medicare Other | Attending: Cardiology

## 2016-10-10 DIAGNOSIS — C61 Malignant neoplasm of prostate: Secondary | ICD-10-CM | POA: Insufficient documentation

## 2016-10-10 DIAGNOSIS — E785 Hyperlipidemia, unspecified: Secondary | ICD-10-CM | POA: Insufficient documentation

## 2016-10-10 DIAGNOSIS — R0602 Shortness of breath: Secondary | ICD-10-CM

## 2016-10-10 DIAGNOSIS — J449 Chronic obstructive pulmonary disease, unspecified: Secondary | ICD-10-CM | POA: Diagnosis not present

## 2016-10-10 DIAGNOSIS — Z87891 Personal history of nicotine dependence: Secondary | ICD-10-CM | POA: Insufficient documentation

## 2016-10-10 DIAGNOSIS — R55 Syncope and collapse: Secondary | ICD-10-CM | POA: Diagnosis not present

## 2016-10-10 DIAGNOSIS — R0789 Other chest pain: Secondary | ICD-10-CM

## 2016-10-10 DIAGNOSIS — I10 Essential (primary) hypertension: Secondary | ICD-10-CM | POA: Insufficient documentation

## 2016-10-16 ENCOUNTER — Telehealth: Payer: Self-pay | Admitting: *Deleted

## 2016-10-16 NOTE — Telephone Encounter (Signed)
Patient left message on nurse line stating that he took Bp earlier and it was 104/72, he Is concerned this is too low. Would like to know what MD thinks he should do.

## 2016-10-17 NOTE — Telephone Encounter (Signed)
Called patient. He did not answer. I was unable to leave VM. I recommend that the patient hold off on taking his blood pressure medication lisinopril-hydrochlorothiazide and rechecking the following day. If it stays low or if the patient develops any feelings of dizziness, SOB, lightheadedness, weakness, patient should stop taking it and come in to be evaluated. If he dose not have any symptoms and his blood pressure becomes elevated, he can continue to take his blood pressure mediation while checking daily for the next week. I will have someone in the clinic try to call the patient again.

## 2016-10-17 NOTE — Telephone Encounter (Signed)
Will forward to MD to advise. Fianna Snowball,CMA  

## 2016-10-18 NOTE — Telephone Encounter (Signed)
Pt contacted and stated his BP today was in the 150s/80s, pt instructed to continue to take his BP meds as instructed and to monitor over the next couple of day, a dairy was suggested to record readings. Precautions given to go call office or go to ED for evaluation. Pt verbalized understanding.

## 2016-11-02 ENCOUNTER — Ambulatory Visit (INDEPENDENT_AMBULATORY_CARE_PROVIDER_SITE_OTHER): Payer: Medicare Other | Admitting: Family Medicine

## 2016-11-02 ENCOUNTER — Encounter: Payer: Self-pay | Admitting: Family Medicine

## 2016-11-02 VITALS — BP 116/80 | HR 102 | Temp 97.9°F | Ht 70.0 in | Wt 181.0 lb

## 2016-11-02 DIAGNOSIS — R42 Dizziness and giddiness: Secondary | ICD-10-CM

## 2016-11-02 DIAGNOSIS — G894 Chronic pain syndrome: Secondary | ICD-10-CM | POA: Diagnosis not present

## 2016-11-02 DIAGNOSIS — M19012 Primary osteoarthritis, left shoulder: Secondary | ICD-10-CM | POA: Diagnosis not present

## 2016-11-02 DIAGNOSIS — M19011 Primary osteoarthritis, right shoulder: Secondary | ICD-10-CM | POA: Diagnosis not present

## 2016-11-02 DIAGNOSIS — Z23 Encounter for immunization: Secondary | ICD-10-CM

## 2016-11-02 DIAGNOSIS — I10 Essential (primary) hypertension: Secondary | ICD-10-CM

## 2016-11-02 DIAGNOSIS — M170 Bilateral primary osteoarthritis of knee: Secondary | ICD-10-CM

## 2016-11-02 MED ORDER — OXYCODONE-ACETAMINOPHEN 10-325 MG PO TABS: 1.0000 | ORAL_TABLET | Freq: Two times a day (BID) | ORAL | 0 refills | Status: DC | PRN

## 2016-11-02 MED ORDER — LISINOPRIL 20 MG PO TABS: 20.0000 mg | ORAL_TABLET | Freq: Every day | ORAL | 11 refills | Status: DC

## 2016-11-02 NOTE — Assessment & Plan Note (Signed)
Patient has been feeling dizzy in the setting of low Bps 90s/50s. Orthostatics not positive. Will lower home bp agent to only lisinopril 20mg  see if it improves symptoms. Patient also reports increased urinary frequency and is mildly tachycardic today to 102. Perhaps he is mildly dehydrated due to HCTZ dieresis.  Will have patient report ambulatory BP over next week. Will have patient return in 1 month to assess BP and dizziness. If becomes hypertensive, consider increasing lisopril dosage.

## 2016-11-02 NOTE — Assessment & Plan Note (Signed)
Continue oxycodone 10-325mg 

## 2016-11-02 NOTE — Assessment & Plan Note (Signed)
Stable. Continue oxycodone 10-325 mg

## 2016-11-02 NOTE — Progress Notes (Signed)
    Subjective:  Joel Donaldson is a 76 y.o. male who presents to the [facility] today with a chief complaint of feeling dizzy and pain medication refills.   HPI:  Chronic Pain: Stable. [name] is hear for chronic pain management. He is currently out of his pain medicine for his OA in both his shoulders and his knees. No change in pain. Only with movement. Limit range of motion.   Dizziness: Pt has been feeling dizziness for past few weeks. Mostly in the morning. Home BP ranging in 90s/50s. Currently taking lisopril/HCTZ 20/12.5 mg everyday. Endorses increased urinary frequency. Denies any chest pain, weakness, shortness of breath, vomiting, abdominal pain, dysuria, falls.    Objective:  Physical Exam: BP 116/80   Pulse (!) 102   Temp 97.9 F (36.6 C) (Oral)   Ht 5\' 10"  (1.778 m)   Wt 82.1 kg (181 lb)   SpO2 99%   BMI 25.97 kg/m   Gen: NAD, resting comfortably CV: RRR with no murmurs appreciated Pulm: NWOB, CTAB with no crackles, wheezes, or rhonchi GI: Normal bowel sounds present. Soft, Nontender, Nondistended. MSK: limited range of motion of shoulders, pain with knee movement. Skin: warm, dry Neuro: grossly normal, moves all extremities Psych: Normal affect and thought content  No results found for this or any previous visit (from the past 72 hour(s)).   Assessment/Plan:  Osteoarthritis of both knees Stable. Continue oxycodone 10-325 mg  Osteoarthritis of both shoulders Continue oxycodone 10-325mg   Dizziness Patient has been feeling dizzy in the setting of low Bps 90s/50s. Orthostatics not positive. Will lower home bp agent to only lisinopril 20mg  see if it improves symptoms. Patient also reports increased urinary frequency and is mildly tachycardic today to 102. Perhaps he is mildly dehydrated due to HCTZ dieresis.  Will have patient report ambulatory BP over next week. Will have patient return in 1 month to assess BP and dizziness. If becomes hypertensive, consider  increasing lisopril dosage.   Essential hypertension Patient has been feeling dizzy in the setting of low Bps 90s/50s. Orthostatics not positive. Will lower home bp agent to only lisinopril 20mg  see if it improves symptoms. Patient also reports increased urinary frequency and is mildly tachycardic today to 102. Perhaps he is mildly dehydrated due to HCTZ dieresis.  Will have patient report ambulatory BP over next week. Will have patient return in 1 month to assess BP and dizziness. If becomes hypertensive, consider increasing lisopril dosage.    Marny Lowenstein, MD, MS FAMILY MEDICINE RESIDENT - PGY1 11/02/2016 2:26 PM

## 2016-11-02 NOTE — Patient Instructions (Signed)
It was a pleasure to see you today! Thank you for choosing [facility] for your primary care. Joel Donaldson was seen for Chronic pain management refill and dizziness. Please stop taking your combined lisinopril-HCTZ 20-12.5mg  pill. I have prescribed you a new prescription for lisinopril 20 mg. Please check your blood pressures everyday next week. Call the clinic back to report your numbers. We will adjust your medication as necessary. Come back to the clinic if in 1 month to recheck your blood pressures.   Best,  Marny Lowenstein, MD, [place] - PGY1 11/02/2016 2:02 PM

## 2016-11-07 ENCOUNTER — Encounter (INDEPENDENT_AMBULATORY_CARE_PROVIDER_SITE_OTHER): Payer: Self-pay

## 2016-11-07 ENCOUNTER — Encounter: Payer: Self-pay | Admitting: Nurse Practitioner

## 2016-11-07 ENCOUNTER — Ambulatory Visit (INDEPENDENT_AMBULATORY_CARE_PROVIDER_SITE_OTHER): Payer: Medicare Other | Admitting: Nurse Practitioner

## 2016-11-07 VITALS — BP 128/80 | HR 97 | Ht 70.0 in | Wt 184.4 lb

## 2016-11-07 DIAGNOSIS — I10 Essential (primary) hypertension: Secondary | ICD-10-CM

## 2016-11-07 DIAGNOSIS — R079 Chest pain, unspecified: Secondary | ICD-10-CM | POA: Diagnosis not present

## 2016-11-07 NOTE — Progress Notes (Signed)
CARDIOLOGY OFFICE NOTE  Date:  11/07/2016    Joel Donaldson Date of Birth: 09-Mar-1940 Medical Record #[id number]  PCP:  Bonnita Hollow, MD  Cardiologist:  Tamala Julian (seen in remote past)   Chief Complaint  Patient presents with  . Hypertension  . Chest Pain    Post hospital visit - seen for Dr. Sheppard Plumber    History of Present Illness: Joel Donaldson is a 76 y.o. male who presents today for a post hospital visit. Seen in the remote past by Dr. Tamala Julian - recently seen by Dr. Radford Pax.   He has a hx of HTN, GERD, alcohol abuse (1/2 pint liquor daily), arthritis with chronic pain, prostate CA, & depression. Review of Epic shows negative nuclear stress test in 2006 with EF 59% as well as echo showing EF 55% and no RWMA; mildly calcified aortic valve.  Hospitalized last month with chest pain.  Work up was negative. He had reproducible symptoms with palpation. No further cardiac work up was really felt to be needed. Echo was obtained - EF ok.   Comes in today. Here with his grand daughter. He is doing well. No more chest pain. Not short of breath. No palpitations. He feels like he is doing ok. Reviewed his echo results with him. He really has no concerns.   Past Medical History:  Diagnosis Date  . Alcohol abuse   . Anxiety   . Arthritis    hands, back, knees  . Asthma   . Depression   . GERD (gastroesophageal reflux disease)   . Glaucoma   . Heart murmur asymptomatic  . Hx of radioisotope therapy 07/26/11   Radiosactive Prostate Seed Implant/ I-125 Seeds  . Hypertension   . Prostate cancer ([place]) dx'd 2016 or 2017   seed implants  . Rheumatoid arthritis ([place]) 10/10/2012    Past Surgical History:  Procedure Laterality Date  . CYSTOSCOPY  09/14/2011   Procedure: CYSTOSCOPY FLEXIBLE;  Surgeon: Hanley Ben, MD;  Location: [facility];  Service: Urology;  Laterality: N/A;  No seeds seen in the bladder  . LUMBAR FUSION  1995  . RADIOACTIVE SEED IMPLANT   09/14/2011   Procedure: RADIOACTIVE SEED IMPLANT;  Surgeon: Hanley Ben, MD;  Location: [place];  Service: Urology;  Laterality: N/A;  Total number of seeds -    . SHOULDER SURGERY  YRS AGO   LEFT     Medications: Current Meds  Medication Sig  . albuterol (PROAIR HFA) 108 (90 Base) MCG/ACT inhaler INHALE 2 PUFFS INTO THE LUNGS EVERY 6 HOURS AS NEEDED FOR WHEEZING OR SHORTNESS OF BREATH  . aspirin EC 81 MG tablet Take 81 mg by mouth every morning.   . diclofenac sodium (VOLTAREN) 1 % GEL Apply 2 g topically 4 (four) times daily as needed (pain).  Marland Kitchen ergocalciferol (VITAMIN D2) 50000 UNITS capsule Take 1 capsule (50,000 Units total) by mouth once a week. (Patient taking differently: Take 50,000 Units by mouth every Tuesday. )  . fluticasone (FLONASE) 50 MCG/ACT nasal spray Place 2 sprays into both nostrils daily. (Patient taking differently: Place 2 sprays into both nostrils daily as needed for allergies. )  . lisinopril (PRINIVIL,ZESTRIL) 20 MG tablet Take 1 tablet (20 mg total) by mouth daily.  . meclizine (ANTIVERT) 25 MG tablet Take 1 tablet (25 mg total) by mouth 3 (three) times daily as needed for dizziness.  . nitroGLYCERIN (NITROSTAT) 0.4 MG SL tablet Place 0.4 mg under the tongue every  5 (five) minutes as needed for chest pain (chest pain).   Marland Kitchen omeprazole (PRILOSEC) 40 MG capsule Take 1 capsule (40 mg total) by mouth every morning.  Derrill Memo ON 01/02/2017] oxyCODONE-acetaminophen (PERCOCET) 10-325 MG tablet Take 1 tablet by mouth 2 (two) times daily as needed for pain.  . polyethylene glycol (MIRALAX / GLYCOLAX) packet Take 17 g by mouth daily as needed for mild constipation.     Allergies: Allergies  Allergen Reactions  . Tramadol Nausea Only  . Pollen Extract Other (See Comments)    Sneezing    Social History: The patient  reports that he quit smoking about 12 years ago. His smoking use included Cigarettes. He smoked 2.00 packs per day. He has never used  smokeless tobacco. He reports that he drinks about 8.4 oz of alcohol per week . He reports that he does not use drugs.   Family History: The patient's family history includes Diabetes in his mother; Heart disease in his father. The only family member he reports who had heart disease was his father who had bypass and pacemaker and lived to be 45.  Review of Systems: Please see the history of present illness.   Otherwise, the review of systems is positive for none.   All other systems are reviewed and negative.   Physical Exam: VS:  BP 128/80 (BP Location: Left Arm, Patient Position: Sitting, Cuff Size: Normal)   Pulse 97   Ht 5\' 10"  (1.778 m)   Wt 184 lb 6.4 oz (83.6 kg)   SpO2 100% Comment: at rest  BMI 26.46 kg/m  .  BMI Body mass index is 26.46 kg/m.  Wt Readings from Last 3 Encounters:  11/07/16 184 lb 6.4 oz (83.6 kg)  11/02/16 181 lb (82.1 kg)  10/09/16 175 lb (79.4 kg)    General: Pleasant. Well developed, well nourished and in no acute distress. Little tremor noted.   HEENT: Normal.  Neck: Supple, no JVD, carotid bruits, or masses noted.  Cardiac: Regular rate and rhythm. No murmurs, rubs, or gallops. No edema.  Respiratory:  Lungs are clear to auscultation bilaterally with normal work of breathing.  GI: Soft and nontender.  MS: No deformity or atrophy. Gait and ROM intact.  Skin: Warm and dry. Color is normal.  Neuro:  Strength and sensation are intact and no gross focal deficits noted.  Psych: Alert, appropriate and with normal affect.   LABORATORY DATA:  EKG:  EKG is not ordered today.  Lab Results  Component Value Date   WBC 3.7 (L) 10/05/2016   HGB 12.5 (L) 10/05/2016   HCT 38.6 (L) 10/05/2016   PLT 189 10/05/2016   GLUCOSE 98 10/05/2016   CHOL 167 12/20/2011   TRIG 90 12/20/2011   HDL 47 12/20/2011   LDLDIRECT 99 06/24/2007   LDLCALC 102 (H) 12/20/2011   ALT 21 02/17/2016   AST 25 02/17/2016   NA 137 10/05/2016   K 4.5 10/05/2016   CL 106  10/05/2016   CREATININE 0.77 10/05/2016   BUN 5 (L) 10/05/2016   CO2 22 10/05/2016   TSH 1.256 12/19/2011   PSA 3.27 06/28/2008   INR 1.05 09/07/2011   HGBA1C 5.9 (H) 12/19/2011     BNP (last 3 results) No results for input(s): BNP in the last 8760 hours.  ProBNP (last 3 results) No results for input(s): PROBNP in the last 8760 hours.   Other Studies Reviewed Today:  Echo Study Conclusions 09/2016  - Left ventricle: The cavity size was  normal. There was mild focal   basal hypertrophy of the septum. Systolic function was vigorous.   The estimated ejection fraction was in the range of 65% to 70%.   Wall motion was normal; there were no regional wall motion   abnormalities. Doppler parameters are consistent with abnormal   left ventricular relaxation (grade 1 diastolic dysfunction). - Aortic valve: Trileaflet; mildly thickened, mildly calcified   leaflets.  Impressions:  - Compared to the prior study, there has been no significant   interval change.   Assessment/Plan:  1. Chest pain, atypical - he had a negative evaluation at the hospital - has not recurred. Was easily reproducible upon palpitation. Would favor continued observation. Do not feel that stress testing is warranted. Echo was stable.   2. History of alcohol abuse - previously counseled on the toxic nature of chronic alcohol use with regards to the heart.   3. Essential HTN - BP is 118/70 by me today - would continue with his current regimen. Mild diastolic dysfunction on recent echo noted.   Current medicines are reviewed with the patient today.  The patient does not have concerns regarding medicines other than what has been noted above.  The following changes have been made:  See above.  Labs/ tests ordered today include:   No orders of the defined types were placed in this encounter.    Disposition:   FU prn.    Patient is agreeable to this plan and will call if any problems develop in the  interim.   SignedTruitt Merle, NP  11/07/2016 8:58 AM  [facility] [address] [place], [place]  [address] Phone: [phone] Fax: [phone]

## 2016-11-07 NOTE — Patient Instructions (Addendum)
We will be checking the following labs today - NONE   Medication Instructions:    Continue with your current medicines.     Testing/Procedures To Be Arranged:  N/A  Follow-Up:   See us back as needed.     Other Special Instructions:   N/A    If you need a refill on your cardiac medications before your next appointment, please call your pharmacy.   Call the [facility] office at [phone] if you have any questions, problems or concerns.      

## 2016-11-30 ENCOUNTER — Ambulatory Visit (INDEPENDENT_AMBULATORY_CARE_PROVIDER_SITE_OTHER): Payer: Medicare Other | Admitting: Family Medicine

## 2016-11-30 ENCOUNTER — Encounter: Payer: Self-pay | Admitting: Family Medicine

## 2016-11-30 ENCOUNTER — Ambulatory Visit: Payer: Self-pay | Admitting: Family Medicine

## 2016-11-30 VITALS — BP 118/62 | HR 94 | Temp 98.0°F | Ht 70.0 in | Wt 185.0 lb

## 2016-11-30 DIAGNOSIS — R42 Dizziness and giddiness: Secondary | ICD-10-CM

## 2016-11-30 DIAGNOSIS — I10 Essential (primary) hypertension: Secondary | ICD-10-CM

## 2016-11-30 NOTE — Assessment & Plan Note (Signed)
  Patient previously experiencing dizziness due to low BP- currently on 20 mg lisinopril daily and tolerating well. BP 118/62 today, patient asymptomatic.  -continue current regimen -follow up with PCP in 2 months -return precautions given for syncope or near syncope

## 2016-11-30 NOTE — Patient Instructions (Signed)
   It was great seeing you today! I'm glad you're feeling better.  Please come back to see Dr. Grandville Silos in about 2 months, before the Christmas holidays. This way you can get your pain medication refilled before holidays.  If you have questions or concerns please do not hesitate to call at [phone].  Lucila Maine, DO PGY-2, [facility] 11/30/2016 2:06 PM

## 2016-11-30 NOTE — Progress Notes (Signed)
    Subjective:    Patient ID: Joel Donaldson, male    DOB: [birth year], 76 y.o.   MRN: [id number]   CC: follow up dizziness  Patient here today to follow up on dizziness. He was seen a month ago and BP medication was decreased. He reports since then he has felt a lot better. Denies further episodes of dizziness. He is happy he mentioned it to the doctor at his last visit. He denies chest pain, SOB, blurred vision, headaches.  Chronic pain is being controlled by current regimen. He tried the voltaren gel but did not experience much relief from it. He reports his pain is mostly in knees and shoulders. He has another couple months of pain medications supply.  Smoking status reviewed- former smoker  Review of Systems- see HPI   Objective:  BP 118/62   Pulse 94   Temp 98 F (36.7 C) (Oral)   Ht 5\' 10"  (1.778 m)   Wt 185 lb (83.9 kg)   SpO2 95%   BMI 26.54 kg/m  Vitals and nursing note reviewed  General: well nourished, in no acute distress HEENT: normocephalic, no nasal discharge, moist mucous membranes, good dentition without erythema or discharge noted in posterior oropharynx Neck: supple, non-tender, without lymphadenopathy Cardiac: RRR, clear S1 and S2, no murmurs, rubs, or gallops Respiratory: clear to auscultation bilaterally, no increased work of breathing Neuro: alert and oriented, no focal deficits  Assessment & Plan:    Essential hypertension  Patient previously experiencing dizziness due to low BP- currently on 20 mg lisinopril daily and tolerating well. BP 118/62 today, patient asymptomatic.  -continue current regimen -follow up with PCP in 2 months -return precautions given for syncope or near syncope  Return in about 2 months (around 01/30/2017).  Lucila Maine, DO Family Medicine Resident PGY-2

## 2017-01-23 ENCOUNTER — Other Ambulatory Visit: Payer: Self-pay | Admitting: Family Medicine

## 2017-01-23 DIAGNOSIS — M170 Bilateral primary osteoarthritis of knee: Secondary | ICD-10-CM

## 2017-01-23 DIAGNOSIS — M19011 Primary osteoarthritis, right shoulder: Secondary | ICD-10-CM

## 2017-01-23 DIAGNOSIS — M19012 Primary osteoarthritis, left shoulder: Secondary | ICD-10-CM

## 2017-01-23 NOTE — Telephone Encounter (Signed)
Pt called to make an appointment to get his pain meds. PCP first available appt is 02/15/17. Pt would like to get a refill on his percocet to last him until that appointment. Please advise

## 2017-01-23 NOTE — Telephone Encounter (Signed)
Will forward to MD to advise. Jazmin Hartsell,CMA  

## 2017-01-25 MED ORDER — OXYCODONE-ACETAMINOPHEN 10-325 MG PO TABS
1.0000 | ORAL_TABLET | Freq: Two times a day (BID) | ORAL | 0 refills | Status: DC | PRN
Start: 2017-01-25 — End: 2017-02-15

## 2017-01-25 NOTE — Telephone Encounter (Signed)
Pt calls back to check the status.  He will be out of meds on 02/02/17.  He has an appt on 02/15/17. [place], Salome Spotted, CMA

## 2017-01-25 NOTE — Telephone Encounter (Signed)
Patient informed.  Jo-Anne Kluth,CMA  

## 2017-01-25 NOTE — Telephone Encounter (Signed)
Please call patient and inform him, I have re-prescribed his medicines for him before his next visit. It is up at the front desk in an envelope.

## 2017-02-15 ENCOUNTER — Ambulatory Visit (INDEPENDENT_AMBULATORY_CARE_PROVIDER_SITE_OTHER): Payer: Medicare Other | Admitting: Family Medicine

## 2017-02-15 ENCOUNTER — Encounter: Payer: Self-pay | Admitting: Family Medicine

## 2017-02-15 ENCOUNTER — Other Ambulatory Visit: Payer: Self-pay

## 2017-02-15 VITALS — BP 122/88 | HR 104 | Temp 98.5°F | Ht 70.0 in | Wt 183.0 lb

## 2017-02-15 DIAGNOSIS — M170 Bilateral primary osteoarthritis of knee: Secondary | ICD-10-CM

## 2017-02-15 DIAGNOSIS — J449 Chronic obstructive pulmonary disease, unspecified: Secondary | ICD-10-CM | POA: Diagnosis not present

## 2017-02-15 DIAGNOSIS — I10 Essential (primary) hypertension: Secondary | ICD-10-CM | POA: Diagnosis not present

## 2017-02-15 MED ORDER — LISINOPRIL 20 MG PO TABS: 20.0000 mg | ORAL_TABLET | Freq: Every day | ORAL | 11 refills | Status: DC

## 2017-02-15 MED ORDER — OXYCODONE-ACETAMINOPHEN 10-325 MG PO TABS: 1.0000 | ORAL_TABLET | Freq: Two times a day (BID) | ORAL | 0 refills | Status: DC | PRN

## 2017-02-15 MED ORDER — ALBUTEROL SULFATE HFA 108 (90 BASE) MCG/ACT IN AERS: INHALATION_SPRAY | RESPIRATORY_TRACT | 3 refills | Status: DC

## 2017-02-15 NOTE — Assessment & Plan Note (Signed)
Increased SOB with exertion. Uses inhaler 1-2 times a day. No cough or recent illness. Not currently smoking. Will have patient return in 1 week to explore additional COPD medications due to increased use of albuterol.

## 2017-02-15 NOTE — Assessment & Plan Note (Signed)
Currently stable on percocet. No change in pain. Will refill for 3 mo.

## 2017-02-15 NOTE — Assessment & Plan Note (Signed)
Stable on current regimen. No dizziness, CP, palpitations. Patient does endorse SOB, but it is exertional and likely related to COPD, improves with inhaler. Keep on current regimen of lisinopril.

## 2017-02-15 NOTE — Progress Notes (Signed)
Subjective:  Joel Donaldson is a 77 y.o. male who presents to the [facility] today with a chief complaint of chronic pain in knees, blood pressure check.   HPI:  Knee Pain, Chronic, Stable Patient is here for chronic Knee pain due to OA. He is stable on his current pain medicine. He takes 2 Percocet daily. He does not need to take more than this. His pain is worse when standing or bearing weight. His pain is slightly He is has not fallen any. Denies any dizziness. He is able to walk, but does require a cain. He enjoys his independence, but does use a handicap placard in car to park close to store entrances. He does not take his pain medication with alcohol. He initially requested getting a knee injection as well, but then decided against in the middle of the visit.   Hypertension, Chronic, Stable Patient is mildly tachycardic today. BP well controlled. Patient was seen in clinic on 11/2016 for dizziness, but has not had any since. He is doing well. Denies chest pain, palpitations. Endorses SOB, but it occurs when walking approximately 1 block. He does have COPD. He uses his rescue albuterol inhaler 1-2 times a day.   COPD, Chronic During ROS, patient said he was using his rescue inhaler 1-2 times a day. He said he needed a refill on his albuterol. He gets SOB when walking 0.5-1 block. This improves with using his inhaler. He denies wheezing, cough, recent illness, chest pain, palpitations. He has not smoked in "many, many" years.   ROS: Per HPI  PMH: Smoking history reviewed.    Objective:  Physical Exam: BP 122/88   Pulse (!) 104   Temp 98.5 F (36.9 C) (Oral)   Ht 5\' 10"  (1.778 m)   Wt 183 lb (83 kg)   SpO2 91%   BMI 26.26 kg/m   Gen: NAD, resting comfortably CV: RRR with no murmurs appreciated Pulm: NWOB, CTAB with no crackles, wheezes, or rhonchi MSK: Wearing knee high compression stockings, uses cane to abmulate, easily able to get up and out of chair without hold onto anything  for stability, no tenderness to palpation over knees, no tenderness to palpation, no lower extremity edema Skin: warm, dry Neuro: grossly normal, moves all extremities Psych: Normal affect and thought content  No results found for this or any previous visit (from the past 72 hour(s)).   Assessment/Plan:  Essential hypertension Stable on current regimen. No dizziness, CP, palpitations. Patient does endorse SOB, but it is exertional and likely related to COPD, improves with inhaler. Keep on current regimen of lisinopril.   COPD (chronic obstructive pulmonary disease) ([facility]) Increased SOB with exertion. Uses inhaler 1-2 times a day. No cough or recent illness. Not currently smoking. Will have patient return in 1 week to explore additional COPD medications due to increased use of albuterol.   Osteoarthritis of both knees Currently stable on percocet. No change in pain. Will refill for 3 mo.    Lab Orders  No laboratory test(s) ordered today    Meds ordered this encounter  Medications  . albuterol (PROAIR HFA) 108 (90 Base) MCG/ACT inhaler    Sig: INHALE 2 PUFFS INTO THE LUNGS EVERY 6 HOURS AS NEEDED FOR WHEEZING OR SHORTNESS OF BREATH    Dispense:  8.5 g    Refill:  3  . DISCONTD: oxyCODONE-acetaminophen (PERCOCET) 10-325 MG tablet    Sig: Take 1 tablet by mouth 2 (two) times daily as needed for pain.  Dispense:  60 tablet    Refill:  0  . lisinopril (PRINIVIL,ZESTRIL) 20 MG tablet    Sig: Take 1 tablet (20 mg total) by mouth daily.    Dispense:  30 tablet    Refill:  11  . DISCONTD: oxyCODONE-acetaminophen (PERCOCET) 10-325 MG tablet    Sig: Take 1 tablet by mouth 2 (two) times daily as needed for pain.    Dispense:  60 tablet    Refill:  0  . oxyCODONE-acetaminophen (PERCOCET) 10-325 MG tablet    Sig: Take 1 tablet by mouth 2 (two) times daily as needed for pain.    Dispense:  60 tablet    Refill:  0    Marny Lowenstein, MD, MS FAMILY MEDICINE RESIDENT - PGY1 02/15/2017  9:56 AM

## 2017-02-15 NOTE — Patient Instructions (Signed)
It was a pleasure to see you today! Thank you for choosing [facility] for your primary care. Joel Donaldson was seen for Blood Pressure Check, Pain Medication Refill. We refilled your pain medication for 3 months. Come back to the clinic in 1 week to discuss your shortness of breath.   Meds ordered this encounter  Medications  . albuterol (PROAIR HFA) 108 (90 Base) MCG/ACT inhaler    Sig: INHALE 2 PUFFS INTO THE LUNGS EVERY 6 HOURS AS NEEDED FOR WHEEZING OR SHORTNESS OF BREATH    Dispense:  8.5 g    Refill:  3  . DISCONTD: oxyCODONE-acetaminophen (PERCOCET) 10-325 MG tablet    Sig: Take 1 tablet by mouth 2 (two) times daily as needed for pain.    Dispense:  60 tablet    Refill:  0  . lisinopril (PRINIVIL,ZESTRIL) 20 MG tablet    Sig: Take 1 tablet (20 mg total) by mouth daily.    Dispense:  30 tablet    Refill:  11  . DISCONTD: oxyCODONE-acetaminophen (PERCOCET) 10-325 MG tablet    Sig: Take 1 tablet by mouth 2 (two) times daily as needed for pain.    Dispense:  60 tablet    Refill:  0  . oxyCODONE-acetaminophen (PERCOCET) 10-325 MG tablet    Sig: Take 1 tablet by mouth 2 (two) times daily as needed for pain.    Dispense:  60 tablet    Refill:  0     Best,  Marny Lowenstein, MD, MS FAMILY MEDICINE RESIDENT - PGY1 02/15/2017 9:08 AM

## 2017-02-22 ENCOUNTER — Encounter: Payer: Self-pay | Admitting: Family Medicine

## 2017-02-22 ENCOUNTER — Ambulatory Visit (INDEPENDENT_AMBULATORY_CARE_PROVIDER_SITE_OTHER): Payer: Medicare Other | Admitting: Family Medicine

## 2017-02-22 ENCOUNTER — Other Ambulatory Visit: Payer: Self-pay

## 2017-02-22 VITALS — BP 138/84 | HR 97 | Temp 98.6°F | Ht 70.0 in | Wt 184.0 lb

## 2017-02-22 DIAGNOSIS — Z720 Tobacco use: Secondary | ICD-10-CM

## 2017-02-22 DIAGNOSIS — Z87891 Personal history of nicotine dependence: Secondary | ICD-10-CM

## 2017-02-22 DIAGNOSIS — Z122 Encounter for screening for malignant neoplasm of respiratory organs: Secondary | ICD-10-CM

## 2017-02-22 DIAGNOSIS — J449 Chronic obstructive pulmonary disease, unspecified: Secondary | ICD-10-CM | POA: Diagnosis not present

## 2017-02-22 MED ORDER — TIOTROPIUM BROMIDE MONOHYDRATE 18 MCG IN CAPS: 18.0000 ug | ORAL_CAPSULE | Freq: Every day | RESPIRATORY_TRACT | 12 refills | Status: DC

## 2017-02-22 NOTE — Assessment & Plan Note (Signed)
Patient has history of increased use of rescue inhaler.  Modified MRC dyspnea score 2.  No PFTs on record.  We will start daily Spiriva.  Have patient come back in 1 month to follow-up breathing.

## 2017-02-22 NOTE — Assessment & Plan Note (Signed)
Patient still falls within guidelines for CT lung cancer screening because he is 63, asymptomatic for lung cancer, and has quit within the past 15 years

## 2017-02-22 NOTE — Progress Notes (Signed)
    Subjective:  Joel Donaldson is a 77 y.o. male who presents to the [facility] today for COPD maintenance.   HPI:  COPD Had it for many years. Has not had to use inhaler in a week, but previously was using it for once or twice a day for several weeks.  He uses inhaler when gets short of breath when walking or walking up steps.  He feels that he has to walk slower than friends his age and get SOB especially when going more than 100 yards.  Patient has mMRC dyspnea score of 2.   ROS: No cough, no nocternal sob. No fevers or chills.  No lower leg swelling, no chest pain   PMH: 100 packyear history, quit 13 years ago. Had pft testing.  He has never had a screening for lung cancer  Objective:  Physical Exam: BP 138/84   Pulse 97   Temp 98.6 F (37 C) (Oral)   Ht 5\' 10"  (1.778 m)   Wt 184 lb (83.5 kg)   SpO2 99%   BMI 26.40 kg/m   Gen: NAD, resting comfortably CV: RRR with no murmurs appreciated Pulm: Bronchovesicular breath sounds in the lower bases with prolonged expiratory phase, NWOB, no crackles, wheezes, or rhonchi MSK: no edema, walks with a cane Skin: warm, dry Psych: Normal affect and thought content  No results found for this or any previous visit (from the past 72 hour(s)).   Assessment/Plan:  COPD (chronic obstructive pulmonary disease) ([place]) Patient has history of increased use of rescue inhaler.  Modified MRC dyspnea score 2.  No PFTs on record.  We will start daily Spiriva.  Have patient come back in 1 month to follow-up breathing.  Former smoker Patient still falls within guidelines for CT lung cancer screening because he is 19, asymptomatic for lung cancer, and has quit within the past 15 years  Orders Placed This Encounter  Procedures  . CT CHEST LUNG CA SCREEN LOW DOSE W/O CM    Epic order Wt 184/cane - no assistance Ins - uhc/mcr Bl/jessica @ [facility]    Standing Status:   Future    Standing Expiration Date:   04/23/2018    Order Specific Question:   Reason  for Exam (SYMPTOM  OR DIAGNOSIS REQUIRED)    Answer:   Lung Ca screen, former smoker    Order Specific Question:   Preferred imaging location?    Answer:   [facility]    Order Specific Question:   Radiology Contrast Protocol - do NOT remove file path    Answer:   \\[email or web address]     Lab Orders  No laboratory test(s) ordered today    Meds ordered this encounter  Medications  . tiotropium (SPIRIVA) 18 MCG inhalation capsule    Sig: Place 1 capsule (18 mcg total) into inhaler and inhale daily.    Dispense:  30 capsule    Refill:  [place], MD, MS FAMILY MEDICINE RESIDENT - PGY1 02/22/2017 9:21 AM

## 2017-02-22 NOTE — Patient Instructions (Addendum)
It was a pleasure to see you today! Thank you for choosing [facility] for your primary care. Joel Donaldson was seen for COPD control.   1. Please stop by the front desk to schedule a follow up with Dr. Valentina Lucks for PFT testing. 2. Go to [facility] at [address], [place] on 02/26/2017 at 8:00AM 3. Come back in 1 mo to follow up on your breathing and your CT results.    Orders Placed This Encounter  Procedures  . CT CHEST LUNG CA SCREEN LOW DOSE W/O CM    Standing Status:   Future    Standing Expiration Date:   04/23/2018    Order Specific Question:   Reason for Exam (SYMPTOM  OR DIAGNOSIS REQUIRED)    Answer:   Lung Ca screen, former smoker    Order Specific Question:   Preferred imaging location?    Answer:   [facility]    Order Specific Question:   Radiology Contrast Protocol - do NOT remove file path    Answer:   \\[email or web address]   Meds ordered this encounter  Medications  . tiotropium (SPIRIVA) 18 MCG inhalation capsule    Sig: Place 1 capsule (18 mcg total) into inhaler and inhale daily.    Dispense:  30 capsule    Refill:  12     Best,  Marny Lowenstein, MD, MS FAMILY MEDICINE RESIDENT - PGY1 02/22/2017 9:08 AM

## 2017-02-26 ENCOUNTER — Telehealth: Payer: Self-pay | Admitting: Family Medicine

## 2017-02-26 ENCOUNTER — Ambulatory Visit
Admission: RE | Admit: 2017-02-26 | Discharge: 2017-02-26 | Disposition: A | Discharge status: Home / Self Care | Payer: Medicare Other | Source: Ambulatory Visit | Attending: Family Medicine | Admitting: Family Medicine

## 2017-02-26 DIAGNOSIS — Z87891 Personal history of nicotine dependence: Secondary | ICD-10-CM

## 2017-02-26 DIAGNOSIS — Z720 Tobacco use: Secondary | ICD-10-CM

## 2017-02-26 NOTE — Progress Notes (Signed)
Pls call pt and tell him his chest CT was normal and showed no signs of cancer.

## 2017-02-26 NOTE — Telephone Encounter (Signed)
Tried calling patient but was not able to leave message due to voicemail being full.  Arlean Thies,CMA

## 2017-02-26 NOTE — Telephone Encounter (Signed)
-----   Message from Bonnita Hollow, MD sent at 02/26/2017 12:07 PM EST ----- Pls call pt and tell him his chest CT was normal and showed no signs of cancer.

## 2017-02-28 ENCOUNTER — Ambulatory Visit (INDEPENDENT_AMBULATORY_CARE_PROVIDER_SITE_OTHER): Payer: Medicare Other | Admitting: Pharmacist

## 2017-02-28 ENCOUNTER — Encounter: Payer: Self-pay | Admitting: Pharmacist

## 2017-02-28 DIAGNOSIS — J449 Chronic obstructive pulmonary disease, unspecified: Secondary | ICD-10-CM | POA: Diagnosis not present

## 2017-02-28 NOTE — Progress Notes (Signed)
Patient ID: Joel Donaldson, male   DOB: 10/09/1940, 77 y.o.   MRN: [id number] Reviewed: I agree with Dr. Graylin Shiver documentation and management.

## 2017-02-28 NOTE — Assessment & Plan Note (Signed)
Spirometry evaluation reveals Moderate restrictive lung disease. Post nebulized albuterol tx revealed no change in lung function values. Patient has been experiencing difficulty breathing while walking and while completing activities of daily living with CAT score > 10. Pt taking albuterol for relief. continue with treatment plan and start tiotropium previously prescribed.  Reviewed results of pulmonary function tests.  Pt verbalized understanding of results and education

## 2017-02-28 NOTE — Patient Instructions (Signed)
Thank you for coming in today!  Your lung function results did not show obstructive lung disease or COPD.  Follow up with Dr. Erin Hearing at your next appointment.

## 2017-02-28 NOTE — Progress Notes (Signed)
   S:    Patient arrives ambulating with a cane and in good spirits. Presents for lung function evaluation and suspected COPD. Patient was referred on 02/22/2017 by PCP Dr. Grandville Silos. Patient reports shortness of breath when walking or walking up steps. Patient reports using rescue albuterol inhaler for relief at this time. Patient is a former smoker quit date 10/2004 with 100 year pack history. Patient reports enjoying walking in the past, but is now limited do to knee pain.  Patient reports last dose of respiratory medications was 2 days ago (albuterol). Patient was newly prescribed tiotropium but has not yet started medication.   O: mMRC score= >2 CAT score= 16 See Documentation Flowsheet - CAT/COPD for complete symptom scoring.  See "scanned report" or Documentation Flowsheet (discrete results - PFTs) for  Spirometry results. Patient provided good effort while attempting spirometry.   Lung Age = 89 Albuterol Neb  Lot# [id number]     Exp. 09/12/2018  A/P: Spirometry evaluation reveals Moderate restrictive lung disease. Post nebulized albuterol tx revealed no change in lung function values. Patient has been experiencing difficulty breathing while walking and while completing activities of daily living with CAT score > 10. Pt taking albuterol for relief. continue with treatment plan and start tiotropium previously prescribed.  Reviewed results of pulmonary function tests.  Pt verbalized understanding of results and education.  Written pt instructions provided.  F/U Clinic visit with PCP in 1 month .   Total time in face to face counseling 35 minutes.  Patient seen with Onnie Boer, PharmD Candidate.

## 2017-04-02 ENCOUNTER — Other Ambulatory Visit: Payer: Self-pay

## 2017-04-02 ENCOUNTER — Ambulatory Visit (INDEPENDENT_AMBULATORY_CARE_PROVIDER_SITE_OTHER): Payer: Medicare Other | Admitting: Family Medicine

## 2017-04-02 ENCOUNTER — Encounter: Payer: Self-pay | Admitting: Family Medicine

## 2017-04-02 VITALS — BP 104/74 | HR 111 | Temp 98.0°F | Ht 70.0 in | Wt 187.2 lb

## 2017-04-02 DIAGNOSIS — J42 Unspecified chronic bronchitis: Secondary | ICD-10-CM | POA: Diagnosis not present

## 2017-04-02 DIAGNOSIS — J441 Chronic obstructive pulmonary disease with (acute) exacerbation: Secondary | ICD-10-CM

## 2017-04-02 NOTE — Progress Notes (Signed)
Subjective:     Joel Donaldson is a 77 y.o. male for evaluation and treatment of COPD. The patient is not currently have symptoms / an exacerbation.  He has improve respiratory control on his Spiriva.  He does not have to use his albuterol inhaler at all.  He denies any cough, congestion, fevers, chills, chest pain, wheezes.   He is able to walk further now without having to stop due to his shortness of breath. Recent PFTs showed that patient had moderate restrictive lung disease.  Review of Systems Pertinent items noted in HPI and remainder of comprehensive ROS otherwise negative.    Objective:    BP 104/74   Pulse (!) 111   Temp 98 F (36.7 C) (Oral) Comment: 98.0  Ht 5\' 10"  (1.778 m)   Wt 187 lb 3.2 oz (84.9 kg)   SpO2 95%   BMI 26.86 kg/m  General appearance: alert and cooperative Head: Normocephalic, without obvious abnormality, atraumatic Lungs: clear to auscultation bilaterally and Normal work of breathing Heart: regular rate and rhythm, S1, S2 normal, no murmur, click, rub or gallop  Diagnostic Review Pulse oximetry, resting: 95%   Assessment and Plan:  COPD (chronic obstructive pulmonary disease) (HCC) Improved symptom control on Spiriva.  Continue daily Spiriva and albuterol as needed.  Patient follow-up as needed.  Strict return precautions given if patient exhibits any symptoms of acute exacerbation.

## 2017-04-02 NOTE — Patient Instructions (Signed)
It was a pleasure to see you today! Thank you for choosing [facility] for your primary care. Joel Donaldson was seen for follow-up on COPD.  I am glad that your breathing is improved.  Come back to the clinic if if you develop any worsening shortness of breath, fevers, chills, have to use your rescue albuterol inhaler more than once every 4 hours to catch her breath, and go to the emergency room if if you develop any of the symptoms and are unable to get clinic or if it is very severe or if you have any additional chest pain.   Best,  Marny Lowenstein, MD, MS FAMILY MEDICINE RESIDENT - PGY1 04/02/2017 1:52 PM

## 2017-04-02 NOTE — Assessment & Plan Note (Signed)
Improved symptom control on Spiriva.  Continue daily Spiriva and albuterol as needed.  Patient follow-up as needed.  Strict return precautions given if patient exhibits any symptoms of acute exacerbation.

## 2017-04-25 ENCOUNTER — Encounter: Payer: Self-pay | Admitting: Family Medicine

## 2017-04-25 ENCOUNTER — Other Ambulatory Visit: Payer: Self-pay

## 2017-04-25 ENCOUNTER — Ambulatory Visit (INDEPENDENT_AMBULATORY_CARE_PROVIDER_SITE_OTHER): Payer: Medicare Other | Admitting: Family Medicine

## 2017-04-25 VITALS — BP 102/64 | HR 94 | Temp 98.2°F | Ht 70.0 in | Wt 189.0 lb

## 2017-04-25 DIAGNOSIS — M170 Bilateral primary osteoarthritis of knee: Secondary | ICD-10-CM

## 2017-04-25 DIAGNOSIS — I10 Essential (primary) hypertension: Secondary | ICD-10-CM | POA: Diagnosis not present

## 2017-04-25 MED ORDER — OXYCODONE-ACETAMINOPHEN 10-325 MG PO TABS: 1.0000 | ORAL_TABLET | Freq: Two times a day (BID) | ORAL | 0 refills | Status: DC | PRN

## 2017-04-25 MED ORDER — LISINOPRIL 20 MG PO TABS: 20.0000 mg | ORAL_TABLET | Freq: Every day | ORAL | 11 refills | Status: DC

## 2017-04-25 MED ORDER — DICLOFENAC SODIUM 1 % TD GEL: 2.0000 g | Freq: Four times a day (QID) | TRANSDERMAL | 0 refills | Status: DC | PRN

## 2017-04-25 NOTE — Patient Instructions (Signed)
It was a pleasure to see you today! Thank you for choosing [facility] for your primary care. Joel Donaldson was seen for pain medication and BP med refill. Come back to the clinic in 3 mo for another refill, come back sooner if your pain worsens.   Best,  Marny Lowenstein, MD, [place] - PGY1 04/25/2017 2:40 PM

## 2017-04-25 NOTE — Assessment & Plan Note (Signed)
Pain currently stable on 10 mg Percocet.  Will refill for another 3 months.

## 2017-04-25 NOTE — Progress Notes (Signed)
Ed refills

## 2017-04-25 NOTE — Progress Notes (Signed)
Subjective:  Joel Donaldson is a 77 y.o. male who presents to the [facility] today with a chief complaint of refill for pain medication for knees and blood pressure medication refill.   HPI:  Bilateral knee OA Pain is same on the oxycodone.  He takes on two 10 mg Percocet orco daily.  He denies any falls, dizziness, excessive sleepiness.  His symptoms are worse if he misses a pain pills.  He still has pain even when he takes pills.  He also uses intermittently voltaran gel which helps the pain as well.  He is not interested in trying anything else at this time for his knee pain.  He says he is not able to walk or get about without his pain medication.  Blood pressure Patient request blood pressure medication refill.  He denies any CP, shortness of breath, palpitations, falls or dizziness.  He has lower extremity swelling, however this is not worsening and is well controlled when he wears his stockings which are on today.    ROS: Per HPI  PMH: Smoking history reviewed.   Objective:  Physical Exam: BP 102/64   Pulse 94   Temp 98.2 F (36.8 C) (Oral)   Ht 5\' 10"  (1.778 m)   Wt 189 lb (85.7 kg)   SpO2 97%   BMI 27.12 kg/m   Gen: NAD, resting comfortably CV: RRR with no murmurs appreciated Pulm: NWOB, CTAB with no crackles, wheezes, or rhonchi GI: Normal bowel sounds present. Soft, Nontender, Nondistended. MSK: Knee joint stable intact, patient ambulates with cane, knee not tender to palpation stockings in place, 1+ edema bilaterally, cyanosis, or clubbing noted Skin: warm, dry Neuro: grossly normal, moves all extremities Psych: Normal affect and thought content  No results found for this or any previous visit (from the past 72 hour(s)).   Assessment/Plan:  Osteoarthritis of both knees Pain currently stable on 10 mg Percocet.  Will refill for another 3 months.  Essential hypertension BP well controlled on lisinopril.  Will refill and have patient come back in 3 months for  recheck.  Meds ordered this encounter  Medications  . lisinopril (PRINIVIL,ZESTRIL) 20 MG tablet    Sig: Take 1 tablet (20 mg total) by mouth daily.    Dispense:  30 tablet    Refill:  11  . diclofenac sodium (VOLTAREN) 1 % GEL    Sig: Apply 2 g topically 4 (four) times daily as needed (pain).    Dispense:  100 g    Refill:  0  . DISCONTD: oxyCODONE-acetaminophen (PERCOCET) 10-325 MG tablet    Sig: Take 1 tablet by mouth 2 (two) times daily as needed for pain.    Dispense:  60 tablet    Refill:  0  . DISCONTD: oxyCODONE-acetaminophen (PERCOCET) 10-325 MG tablet    Sig: Take 1 tablet by mouth 2 (two) times daily as needed for pain.    Dispense:  60 tablet    Refill:  0  . DISCONTD: oxyCODONE-acetaminophen (PERCOCET) 10-325 MG tablet    Sig: Take 1 tablet by mouth 2 (two) times daily as needed for pain.    Dispense:  60 tablet    Refill:  0  . oxyCODONE-acetaminophen (PERCOCET) 10-325 MG tablet    Sig: Take 1 tablet by mouth 2 (two) times daily as needed for pain.    Dispense:  60 tablet    Refill:  0    Marny Lowenstein, MD, MS FAMILY MEDICINE RESIDENT - PGY1 04/25/2017 3:00 PM

## 2017-04-25 NOTE — Assessment & Plan Note (Signed)
BP well controlled on lisinopril.  Will refill and have patient come back in 3 months for recheck.

## 2017-05-12 ENCOUNTER — Other Ambulatory Visit: Payer: Self-pay | Admitting: Family Medicine

## 2017-05-12 DIAGNOSIS — M170 Bilateral primary osteoarthritis of knee: Secondary | ICD-10-CM

## 2017-05-30 ENCOUNTER — Other Ambulatory Visit: Payer: Self-pay | Admitting: Family Medicine

## 2017-05-30 DIAGNOSIS — M170 Bilateral primary osteoarthritis of knee: Secondary | ICD-10-CM

## 2017-06-17 ENCOUNTER — Other Ambulatory Visit: Payer: Self-pay | Admitting: Family Medicine

## 2017-07-04 ENCOUNTER — Other Ambulatory Visit: Payer: Self-pay | Admitting: Family Medicine

## 2017-07-17 DIAGNOSIS — H04123 Dry eye syndrome of bilateral lacrimal glands: Secondary | ICD-10-CM | POA: Diagnosis not present

## 2017-07-17 DIAGNOSIS — H31091 Other chorioretinal scars, right eye: Secondary | ICD-10-CM | POA: Diagnosis not present

## 2017-07-17 DIAGNOSIS — H25811 Combined forms of age-related cataract, right eye: Secondary | ICD-10-CM | POA: Diagnosis not present

## 2017-07-17 DIAGNOSIS — H10413 Chronic giant papillary conjunctivitis, bilateral: Secondary | ICD-10-CM | POA: Diagnosis not present

## 2017-07-30 ENCOUNTER — Ambulatory Visit (INDEPENDENT_AMBULATORY_CARE_PROVIDER_SITE_OTHER): Payer: Medicare Other | Admitting: Family Medicine

## 2017-07-30 ENCOUNTER — Other Ambulatory Visit: Payer: Self-pay

## 2017-07-30 ENCOUNTER — Encounter: Payer: Self-pay | Admitting: Family Medicine

## 2017-07-30 VITALS — BP 128/68 | HR 95 | Temp 97.6°F | Ht 70.0 in | Wt 180.0 lb

## 2017-07-30 DIAGNOSIS — M170 Bilateral primary osteoarthritis of knee: Secondary | ICD-10-CM | POA: Diagnosis not present

## 2017-07-30 DIAGNOSIS — K219 Gastro-esophageal reflux disease without esophagitis: Secondary | ICD-10-CM

## 2017-07-30 DIAGNOSIS — Z136 Encounter for screening for cardiovascular disorders: Secondary | ICD-10-CM

## 2017-07-30 MED ORDER — OXYCODONE-ACETAMINOPHEN 10-325 MG PO TABS: 1.0000 | ORAL_TABLET | Freq: Two times a day (BID) | ORAL | 0 refills | Status: DC | PRN

## 2017-07-30 MED ORDER — OMEPRAZOLE 40 MG PO CPDR: 40.0000 mg | DELAYED_RELEASE_CAPSULE | Freq: Every morning | ORAL | 3 refills | Status: DC

## 2017-07-30 NOTE — Assessment & Plan Note (Signed)
Patient GERD symptoms are stable on current PPI.  Continue to prescribe as needed.

## 2017-07-30 NOTE — Patient Instructions (Addendum)

## 2017-07-30 NOTE — Assessment & Plan Note (Signed)
Arthritis both knees stable on current oxycodone dose.  Continue.  Have patient follow-up in 3 months.  Will need to get current UDS at next visit.

## 2017-07-30 NOTE — Progress Notes (Signed)
    Subjective:  Joel Donaldson is a 77 y.o. male who presents to the [facility] today for follow-up on osteoarthritis of his knees HPI:  OA Knees Bilaterally Patient states that his pain is well controlled on his oxycodone.  Denies any worsening of knee pain.  He says that when he misses his dose that it is worse.  Pain is not radiating anywhere denies any swelling of his joints.  Denies fevers chills.  Patient ambulates with a cane regularly.  He denies any falls dizziness or excessive sleepiness on spells.  Denies missing any doses of his pills.  GERD Patient gets heartburn occasionally after eating.  Says that is currently controlled on his omeprazole.  Denies any worsening abdominal pain, diarrhea, constipation.  ROS: Per HPI  PMH: Smoking history reviewed.    Objective:  Physical Exam: BP 128/68   Pulse 95   Temp 97.6 F (36.4 C) (Oral)   Ht 5\' 10"  (1.778 m)   Wt 180 lb (81.6 kg)   SpO2 96%   BMI 25.83 kg/m   Gen: NAD, resting comfortably CV: RRR with no murmurs appreciated Pulm: NWOB, CTAB with no crackles, wheezes, or rhonchi GI: Normal bowel sounds present. Soft, Nontender, Nondistended. MSK: Knee joints stable and intact, no erythema, nontender, wears stockings bilaterally up to the knees for lower extremity swelling.   Skin: warm, dry Neuro: grossly normal, moves all extremities Psych: Normal affect and thought content  No results found for this or any previous visit (from the past 72 hour(s)).   Assessment/Plan:  Osteoarthritis of both knees Arthritis both knees stable on current oxycodone dose.  Continue.  Have patient follow-up in 3 months.  Will need to get current UDS at next visit.  GASTROESOPHAGEAL REFLUX, NO ESOPHAGITIS Patient GERD symptoms are stable on current PPI.  Continue to prescribe as needed.    Lab Orders     Lipid Profile     ToxASSURE Select 12(MW)  Meds ordered this encounter  Medications  . omeprazole (PRILOSEC) 40 MG capsule    Sig:  Take 1 capsule (40 mg total) by mouth every morning.    Dispense:  90 capsule    Refill:  3  . DISCONTD: oxyCODONE-acetaminophen (PERCOCET) 10-325 MG tablet    Sig: Take 1 tablet by mouth 2 (two) times daily as needed for pain.    Dispense:  60 tablet    Refill:  0  . DISCONTD: oxyCODONE-acetaminophen (PERCOCET) 10-325 MG tablet    Sig: Take 1 tablet by mouth 2 (two) times daily as needed for pain.    Dispense:  60 tablet    Refill:  0  . oxyCODONE-acetaminophen (PERCOCET) 10-325 MG tablet    Sig: Take 1 tablet by mouth 2 (two) times daily as needed for pain.    Dispense:  60 tablet    Refill:  0    Marny Lowenstein, MD, MS FAMILY MEDICINE RESIDENT - PGY1 07/30/2017 3:36 PM

## 2017-07-31 LAB — LIPID PANEL
CHOL/HDL RATIO: 4 ratio (ref 0.0–5.0)
Cholesterol, Total: 170 mg/dL (ref 100–199)
HDL: 43 mg/dL (ref >39)
LDL Calculated: 80 mg/dL (ref 0–99)
Triglycerides: 237 mg/dL — ABNORMAL HIGH (ref 0–149)
VLDL CHOLESTEROL CAL: 47 mg/dL — AB (ref 5–40)

## 2017-08-06 ENCOUNTER — Encounter: Payer: Self-pay | Admitting: Family Medicine

## 2017-09-19 ENCOUNTER — Other Ambulatory Visit: Payer: Self-pay | Admitting: Family Medicine

## 2017-09-19 ENCOUNTER — Other Ambulatory Visit: Payer: Self-pay | Admitting: *Deleted

## 2017-09-19 MED ORDER — OXYCODONE-ACETAMINOPHEN 10-325 MG PO TABS: 1.0000 | ORAL_TABLET | Freq: Two times a day (BID) | ORAL | 0 refills | Status: DC | PRN

## 2017-09-19 NOTE — Telephone Encounter (Signed)
Patient came into office today and wants to request refill of Percocet.  Will route request to Dr. Grandville Silos and call patient back.  Burna Forts, BSN, RN-BC

## 2017-09-19 NOTE — Telephone Encounter (Signed)
Pt contacted and informed of rx to pharmacy.

## 2017-10-02 ENCOUNTER — Telehealth: Payer: Self-pay

## 2017-10-02 ENCOUNTER — Ambulatory Visit (INDEPENDENT_AMBULATORY_CARE_PROVIDER_SITE_OTHER): Payer: Medicare Other | Admitting: Family Medicine

## 2017-10-02 ENCOUNTER — Other Ambulatory Visit: Payer: Self-pay

## 2017-10-02 ENCOUNTER — Encounter: Payer: Self-pay | Admitting: Family Medicine

## 2017-10-02 DIAGNOSIS — M170 Bilateral primary osteoarthritis of knee: Secondary | ICD-10-CM | POA: Diagnosis not present

## 2017-10-02 MED ORDER — OXYCODONE-ACETAMINOPHEN 10-325 MG PO TABS: 1.0000 | ORAL_TABLET | Freq: Two times a day (BID) | ORAL | 0 refills | Status: DC | PRN

## 2017-10-02 MED ORDER — OXYCODONE-ACETAMINOPHEN 10-325 MG PO TABS: 1.0000 | ORAL_TABLET | Freq: Three times a day (TID) | ORAL | 0 refills | Status: DC | PRN

## 2017-10-02 NOTE — Telephone Encounter (Signed)
Changed Rx to match. Please call pharmacy to see if there are any other issues.

## 2017-10-02 NOTE — Patient Instructions (Addendum)
It was a pleasure to see you today! Thank you for choosing [facility] for your primary care. Joel Donaldson was seen for knee pain.     Best,  Marny Lowenstein, MD, MS FAMILY MEDICINE RESIDENT - PGY2 10/02/2017 3:03 PM

## 2017-10-02 NOTE — Telephone Encounter (Signed)
[facility] calling for clarification on oxycodone rx's. Received 3 different prescriptions today, one for qty 60 and 2 for quantity of 30. Please call [phone] to clarify which Rx pt is to receive. Wallace Cullens, RN

## 2017-10-04 ENCOUNTER — Encounter: Payer: Self-pay | Admitting: Family Medicine

## 2017-10-04 NOTE — Assessment & Plan Note (Signed)
Cont current therapy. Return in 3 mo. Recheck annual UDS.

## 2017-10-04 NOTE — Progress Notes (Signed)
    Subjective:  Joel Donaldson is a 77 y.o. male who presents to the [facility] today with a chief complaint of bilateral knee pain.   HPI: Patient presents with bilateral knee pain.  It is chronic.  He has osteoarthritic knees bilaterally.  Pain is well controlled on oxycodone 10-325 mg twice daily.  His pain is not getting worse.  He said his pain would be worse with out taking the medication.   ROS: Per HPI   Objective:  Physical Exam: BP 110/62   Pulse 73   Temp 98.4 F (36.9 C) (Oral)   Ht 5\' 10"  (1.778 m)   Wt 183 lb 9.6 oz (83.3 kg)   SpO2 99%   BMI 26.34 kg/m   Gen: NAD, resting comfortably MSK: Knees with full range of motion, no obvious effusion or edema, nontender palpation   No results found for this or any previous visit (from the past 72 hour(s)).   Assessment/Plan:  Osteoarthritis of both knees Cont current therapy. Return in 3 mo. Recheck annual UDS.    Lab Orders  No laboratory test(s) ordered today    Meds ordered this encounter  Medications  . oxyCODONE-acetaminophen (PERCOCET) 10-325 MG tablet    Sig: Take 1 tablet by mouth every 12 (twelve) hours as needed for pain.    Dispense:  60 tablet    Refill:  0    space refills by 30 days  . DISCONTD: oxyCODONE-acetaminophen (PERCOCET) 10-325 MG tablet    Sig: Take 1 tablet by mouth every 8 (eight) hours as needed for pain.    Dispense:  30 tablet    Refill:  0  . DISCONTD: oxyCODONE-acetaminophen (PERCOCET) 10-325 MG tablet    Sig: Take 1 tablet by mouth every 8 (eight) hours as needed for pain.    Dispense:  30 tablet    Refill:  0    Marny Lowenstein, MD, MS FAMILY MEDICINE RESIDENT - PGY2 10/04/2017 11:13 AM

## 2017-10-09 ENCOUNTER — Encounter: Payer: Self-pay | Admitting: Family Medicine

## 2017-10-09 LAB — TOXASSURE SELECT 12(MW)

## 2017-10-09 NOTE — Progress Notes (Signed)
At next visit, recheck urine for opioid use as last was negative. Also discuss alcohol use as urine tested positive. Marland Kitchen

## 2017-11-18 ENCOUNTER — Other Ambulatory Visit: Payer: Self-pay | Admitting: Family Medicine

## 2017-11-18 MED ORDER — OXYCODONE-ACETAMINOPHEN 10-325 MG PO TABS: 1.0000 | ORAL_TABLET | Freq: Two times a day (BID) | ORAL | 0 refills | Status: DC | PRN

## 2017-11-18 NOTE — Telephone Encounter (Signed)
2nd request

## 2017-11-18 NOTE — Telephone Encounter (Signed)
Patient came to office request refill on RX Percocet. Please let patient know when ready to pick up. [phone].

## 2017-11-29 ENCOUNTER — Other Ambulatory Visit: Payer: Self-pay | Admitting: Family Medicine

## 2017-11-29 DIAGNOSIS — F1120 Opioid dependence, uncomplicated: Secondary | ICD-10-CM | POA: Insufficient documentation

## 2017-11-29 DIAGNOSIS — F1129 Opioid dependence with unspecified opioid-induced disorder: Secondary | ICD-10-CM

## 2017-12-11 ENCOUNTER — Other Ambulatory Visit: Payer: Self-pay

## 2017-12-11 ENCOUNTER — Ambulatory Visit (INDEPENDENT_AMBULATORY_CARE_PROVIDER_SITE_OTHER): Payer: Medicare Other | Admitting: Family Medicine

## 2017-12-11 ENCOUNTER — Encounter: Payer: Self-pay | Admitting: Family Medicine

## 2017-12-11 DIAGNOSIS — M170 Bilateral primary osteoarthritis of knee: Secondary | ICD-10-CM | POA: Diagnosis not present

## 2017-12-11 DIAGNOSIS — J449 Chronic obstructive pulmonary disease, unspecified: Secondary | ICD-10-CM

## 2017-12-11 MED ORDER — DICLOFENAC SODIUM 1 % TD GEL: TRANSDERMAL | 0 refills | Status: DC

## 2017-12-11 MED ORDER — OXYCODONE-ACETAMINOPHEN 10-325 MG PO TABS: 1.0000 | ORAL_TABLET | Freq: Two times a day (BID) | ORAL | 0 refills | Status: DC | PRN

## 2017-12-11 MED ORDER — ALBUTEROL SULFATE HFA 108 (90 BASE) MCG/ACT IN AERS: INHALATION_SPRAY | RESPIRATORY_TRACT | 3 refills | Status: DC

## 2017-12-11 NOTE — Patient Instructions (Signed)
It was a pleasure to see you today! Thank you for choosing [facility] for your primary care. Jovian Lembcke Abad was seen for knee pain and medication refill.    We refilled your medications listed below:  Meds ordered this encounter  Medications  . oxyCODONE-acetaminophen (PERCOCET) 10-325 MG tablet    Sig: Take 1 tablet by mouth every 12 (twelve) hours as needed for pain.    Dispense:  60 tablet    Refill:  0    Space refill by 30 days  . oxyCODONE-acetaminophen (PERCOCET) 10-325 MG tablet    Sig: Take 1 tablet by mouth every 12 (twelve) hours as needed for pain.    Dispense:  60 tablet    Refill:  0    Space refill by 30 days  . oxyCODONE-acetaminophen (PERCOCET) 10-325 MG tablet    Sig: Take 1 tablet by mouth every 12 (twelve) hours as needed for pain.    Dispense:  60 tablet    Refill:  0    space refills by 30 days  . diclofenac sodium (VOLTAREN) 1 % GEL    Sig: APPLY 2 GRAMS EXTERNALLY TO THE AFFECTED AREA FOUR TIMES DAILY AS NEEDED FOR PAIN    Dispense:  100 g    Refill:  0  . albuterol (PROAIR HFA) 108 (90 Base) MCG/ACT inhaler    Sig: INHALE 2 PUFFS INTO THE LUNGS EVERY 6 HOURS AS NEEDED FOR WHEEZING OR SHORTNESS OF BREATH    Dispense:  8.5 g    Refill:  3   Best,  Marny Lowenstein, MD, MS FAMILY MEDICINE RESIDENT - PGY2 12/11/2017 3:14 PM

## 2017-12-17 NOTE — Progress Notes (Signed)
Subjective:  Joel Donaldson is a 77 y.o. male who presents to the [facility] today for follow-up on osteoarthritis of both knees.   HPI: Patient coming in for his 30-month assessment of osteoarthritis of his knees and refill of his oxycodone.  Patient's pain is worsening.  He says it is due to the "changing weather".  Although he says his pain is well controlled with diclofenac gel and oxycodone 10/325 which he takes every 12 hours as needed.  He denies any swelling, fevers, chills, clicks or pops, falls, trauma to his knee.  PEG questionnaire Scale of 1-10 1. What number best describes your pain on average in the past week? 8 2. What number best describes how, during the past week, pain has interfered with your enjoyment of life? 7 3. What number best describes how, during the past week, pain has interfered with your general activity? 8  Medication refill: Patient needs refill on albuterol inhaler.  ROS: Per HPI, otherwise a complete review of systems is negative.  PMH: Smoking history reviewed.    Objective:  Physical Exam: BP 125/78   Pulse 99   Temp 98 F (36.7 C) (Oral)   Wt 184 lb (83.5 kg)   SpO2 95%   BMI 26.40 kg/m   Physical Exam  Constitutional: He is oriented to person, place, and time. No distress.  HENT:  Head: Normocephalic and atraumatic.  Eyes: Conjunctivae are normal. No scleral icterus.  Neck: No JVD present.  Musculoskeletal: He exhibits no edema.       Right knee: He exhibits no swelling and no effusion. No tenderness found.       Left knee: He exhibits no swelling and no effusion. No tenderness found.  Neurological: He is alert and oriented to person, place, and time.  Skin: He is not diaphoretic.  Psychiatric: He has a normal mood and affect. His behavior is normal.     No results found for this or any previous visit (from the past 72 hour(s)).   Assessment/Plan:  Osteoarthritis of both knees Patient returning for follow-up on osteo-arthritis.   Stable on current regimen.  Continue oxycodone and diclofenac.  Discussed with patient alternative pain medication measures.  And not interested in trial of different medication at this time.  Patient follow-up in 3 months.   Lab Orders  No laboratory test(s) ordered today    Meds ordered this encounter  Medications  . oxyCODONE-acetaminophen (PERCOCET) 10-325 MG tablet    Sig: Take 1 tablet by mouth every 12 (twelve) hours as needed for pain.    Dispense:  60 tablet    Refill:  0    Space refill by 30 days  . oxyCODONE-acetaminophen (PERCOCET) 10-325 MG tablet    Sig: Take 1 tablet by mouth every 12 (twelve) hours as needed for pain.    Dispense:  60 tablet    Refill:  0    Space refill by 30 days  . oxyCODONE-acetaminophen (PERCOCET) 10-325 MG tablet    Sig: Take 1 tablet by mouth every 12 (twelve) hours as needed for pain.    Dispense:  60 tablet    Refill:  0    space refills by 30 days  . diclofenac sodium (VOLTAREN) 1 % GEL    Sig: APPLY 2 GRAMS EXTERNALLY TO THE AFFECTED AREA FOUR TIMES DAILY AS NEEDED FOR PAIN    Dispense:  100 g    Refill:  0  . albuterol (PROAIR HFA) 108 (90 Base) MCG/ACT inhaler  Sig: INHALE 2 PUFFS INTO THE LUNGS EVERY 6 HOURS AS NEEDED FOR WHEEZING OR SHORTNESS OF BREATH    Dispense:  8.5 g    Refill:  3      Marny Lowenstein, MD, MS FAMILY MEDICINE RESIDENT - PGY2 12/17/2017 3:17 PM

## 2017-12-17 NOTE — Assessment & Plan Note (Addendum)
Patient returning for follow-up on osteo-arthritis.  Stable on current regimen.  Continue oxycodone and diclofenac.  Discussed with patient alternative pain medication measures.  And not interested in trial of different medication at this time.  Patient follow-up in 3 months.

## 2018-01-01 ENCOUNTER — Other Ambulatory Visit: Payer: Self-pay | Admitting: Family Medicine

## 2018-01-01 DIAGNOSIS — M170 Bilateral primary osteoarthritis of knee: Secondary | ICD-10-CM

## 2018-01-16 ENCOUNTER — Other Ambulatory Visit: Payer: Self-pay | Admitting: Family Medicine

## 2018-01-16 NOTE — Telephone Encounter (Signed)
Patient needs pain meds refilled, refilled to his current pharmacy .

## 2018-01-16 NOTE — Telephone Encounter (Signed)
Spoke with patient and informed him that he had 3 scripts for his pain medication sent on 12/11/17.  He is aware of this but states that pharmacy told him that they didn't have anything for him.  I spoke with the pharmacy and patient filled his first script on 12-18-17 and would not be due to fill him next one until tomorrow 01-17-18.  He will have 1 more script on file after that for 02-17-18 or after.  Patient voiced understanding and will pick up him next script tomorrow.  Jazmin Hartsell,CMA

## 2018-01-23 ENCOUNTER — Other Ambulatory Visit: Payer: Self-pay | Admitting: Family Medicine

## 2018-01-23 DIAGNOSIS — M170 Bilateral primary osteoarthritis of knee: Secondary | ICD-10-CM

## 2018-02-10 ENCOUNTER — Encounter (HOSPITAL_COMMUNITY): Payer: Self-pay | Admitting: Emergency Medicine

## 2018-02-10 ENCOUNTER — Ambulatory Visit (HOSPITAL_COMMUNITY)
Admission: EM | Admit: 2018-02-10 | Discharge: 2018-02-10 | Disposition: A | Discharge status: Home / Self Care | Payer: Medicare Other | Attending: Family Medicine | Admitting: Family Medicine

## 2018-02-10 DIAGNOSIS — M1712 Unilateral primary osteoarthritis, left knee: Secondary | ICD-10-CM | POA: Diagnosis not present

## 2018-02-10 DIAGNOSIS — G8929 Other chronic pain: Secondary | ICD-10-CM

## 2018-02-10 DIAGNOSIS — M25562 Pain in left knee: Secondary | ICD-10-CM | POA: Insufficient documentation

## 2018-02-10 DIAGNOSIS — M25462 Effusion, left knee: Secondary | ICD-10-CM | POA: Insufficient documentation

## 2018-02-10 MED ORDER — METHYLPREDNISOLONE ACETATE 40 MG/ML IJ SUSP
40.0000 mg | Freq: Once | INTRAMUSCULAR | Status: AC
  Administered 2018-02-10: 40 mg via INTRA_ARTICULAR

## 2018-02-10 MED ORDER — LIDOCAINE HCL 2 % IJ SOLN
INTRAMUSCULAR | Status: AC
  Filled 2018-02-10: qty 20

## 2018-02-10 MED ORDER — METHYLPREDNISOLONE ACETATE 40 MG/ML IJ SUSP
INTRAMUSCULAR | Status: AC
  Filled 2018-02-10: qty 1

## 2018-02-10 NOTE — ED Provider Notes (Signed)
[place]    CSN: [id number] Arrival date & time: 02/10/18  1117     History   Chief Complaint Chief Complaint  Patient presents with  . Knee Pain    HPI Joel Donaldson is a 77 y.o. male.   HPI  Here for knee swelling He has known end stage severe OA of knees X rays this year are reviewed and shown to patient She states he has had no change in his activity.  No overuse.  No fall.  No twisting or injury.  Over the last couple of days his knee has become very painful, and very swollen.  He states it is limiting his movement.  He is limiting his ambulation. He is under the care of the family practice group for his osteoarthritis of knees.  Treated with opioids.  He refuses to see an orthopedist or to consider knee replacement.   Past Medical History:  Diagnosis Date  . Alcohol abuse   . Anxiety   . Arthritis    hands, back, knees  . Asthma   . Depression   . GERD (gastroesophageal reflux disease)   . Glaucoma   . Heart murmur asymptomatic  . Hx of radioisotope therapy 07/26/11   Radiosactive Prostate Seed Implant/ I-125 Seeds  . Hypertension   . Prostate cancer ([place]) dx'd 2016 or 2017   seed implants  . Rheumatoid arthritis ([place]) 10/10/2012    Patient Active Problem List   Diagnosis Date Noted  . Opioid dependence ([place]) 11/29/2017  . Former smoker 02/22/2017  . Essential hypertension   . Baker's cyst of knee, right 06/21/2016  . Right leg swelling 06/14/2016  . Eustachian tube dysfunction 11/17/2015  . Back pain 09/19/2015  . Health care maintenance 05/20/2014  . Unspecified vitamin D deficiency 07/07/2013  . Rheumatoid arthritis ([place]) 10/10/2012  . Osteoarthritis of both shoulders 02/15/2012  . Tendinopathy of rotator cuff 01/20/2012  . Preventative health care 12/03/2011  . Osteoarthritis of both knees 07/02/2011  . Alcohol use 05/01/2010  . INTENTION TREMOR 09/09/2008  . Prostate cancer ([place]) 07/01/2008  . HYPERLIPIDEMIA 06/24/2007  .  COPD (chronic obstructive pulmonary disease) ([place]) 05/12/2007  . MIGRAINE, UNSPEC., W/O INTRACTABLE MIGRAINE 04/11/2006  . GASTROESOPHAGEAL REFLUX, NO ESOPHAGITIS 04/11/2006    Past Surgical History:  Procedure Laterality Date  . CYSTOSCOPY  09/14/2011   Procedure: CYSTOSCOPY FLEXIBLE;  Surgeon: Hanley Ben, MD;  [facility]: [facility];  Service: Urology;  Laterality: N/A;  No seeds seen in the bladder  . LUMBAR FUSION  1995  . RADIOACTIVE SEED IMPLANT  09/14/2011   Procedure: RADIOACTIVE SEED IMPLANT;  Surgeon: Hanley Ben, MD;  Location: [facility];  Service: Urology;  Laterality: N/A;  Total number of seeds -    . SHOULDER SURGERY  YRS AGO   LEFT       Home Medications    Prior to Admission medications   Medication Sig Start Date End Date Taking? Authorizing Provider  albuterol (PROAIR HFA) 108 (90 Base) MCG/ACT inhaler INHALE 2 PUFFS INTO THE LUNGS EVERY 6 HOURS AS NEEDED FOR WHEEZING OR SHORTNESS OF BREATH 12/11/17   Bonnita Hollow, MD  aspirin EC 81 MG tablet Take 81 mg by mouth every morning.     [provider]  diclofenac sodium (VOLTAREN) 1 % GEL APPLY 2 GRAMS EXTERNALLY TO THE AFFECTED AREA FOUR TIMES DAILY AS NEEDED FOR PAIN 01/23/18   Bonnita Hollow, MD  fluticasone [facility]) 50 MCG/ACT nasal spray  PLACE 2 SPRAYS INTO BOTH NOSTRILS DAILY 06/18/17   Bonnita Hollow, MD  lisinopril (PRINIVIL,ZESTRIL) 20 MG tablet Take 1 tablet (20 mg total) by mouth daily. 04/25/17 04/25/18  Bonnita Hollow, MD  nitroGLYCERIN (NITROSTAT) 0.4 MG SL tablet Place 0.4 mg under the tongue every 5 (five) minutes as needed for chest pain (chest pain).  12/20/11   Street, Sharon Mt, MD  omeprazole (PRILOSEC) 40 MG capsule Take 1 capsule (40 mg total) by mouth every morning. 07/30/17   Bonnita Hollow, MD  oxyCODONE-acetaminophen (PERCOCET) 10-325 MG tablet Take 1 tablet by mouth every 12 (twelve) hours as needed for pain. 12/11/17   Bonnita Hollow, MD  oxyCODONE-acetaminophen (PERCOCET) 10-325 MG tablet Take 1 tablet by mouth every 12 (twelve) hours as needed for pain. 12/11/17   Bonnita Hollow, MD  oxyCODONE-acetaminophen (PERCOCET) 10-325 MG tablet Take 1 tablet by mouth every 12 (twelve) hours as needed for pain. 12/11/17   Bonnita Hollow, MD  polyethylene glycol [facility]aveling) packet Take 17 g by mouth daily as needed for mild constipation. 10/05/16   Bonnita Hollow, MD    Family History Family History  Problem Relation Age of Onset  . Diabetes Mother   . Heart disease Father        lived to 57 - had bypass, pacemaker    Social History Social History   Tobacco Use  . Smoking status: Former Smoker    Packs/day: 2.00    Years: 50.00    Pack years: 100.00    Types: Cigarettes    Start date: 02/12/1953    Last attempt to quit: 10/13/2004    Years since quitting: 13.3  . Smokeless tobacco: Never Used  . Tobacco comment: Smoked 2 ppd  Substance Use Topics  . Alcohol use: Yes    Alcohol/week: 14.0 standard drinks    Types: 14 Shots of liquor per week    Comment: 1/2 pint of liquor daily  . Drug use: No     Allergies   Tramadol and Pollen extract   Review of Systems Review of Systems  Constitutional: Negative for chills and fever.  HENT: Negative for ear pain and sore throat.   Eyes: Negative for pain and visual disturbance.  Respiratory: Negative for cough and shortness of breath.   Cardiovascular: Negative for chest pain and palpitations.  Gastrointestinal: Negative for abdominal pain and vomiting.  Genitourinary: Negative for dysuria and hematuria.  Musculoskeletal: Positive for arthralgias, gait problem and joint swelling. Negative for back pain.  Skin: Negative for color change and rash.  Neurological: Negative for seizures and syncope.  All other systems reviewed and are negative.    Physical Exam Triage Vital Signs ED Triage Vitals [02/10/18 1410]  Enc Vitals Group     BP  125/75     Pulse Rate 93     Resp 18     Temp 98.6 F (37 C)     Temp src      SpO2 100 %     Weight      Height      Head Circumference      Peak Flow      Pain Score 7     Pain Loc      Pain Edu?      Excl. in [place]?    No data found.  Updated Vital Signs BP 125/75   Pulse 93   Temp 98.6 F (37 C)   Resp 18  SpO2 100%      Physical Exam Constitutional:      General: He is not in acute distress.    Appearance: He is well-developed.  HENT:     Head: Normocephalic and atraumatic.  Eyes:     Conjunctiva/sclera: Conjunctivae normal.     Pupils: Pupils are equal, round, and reactive to light.  Neck:     Musculoskeletal: Normal range of motion.  Cardiovascular:     Rate and Rhythm: Normal rate.  Pulmonary:     Effort: Pulmonary effort is normal. No respiratory distress.  Abdominal:     General: There is no distension.     Palpations: Abdomen is soft.  Musculoskeletal:     Left knee: He exhibits decreased range of motion, effusion and deformity. Tenderness found. Lateral joint line tenderness noted.     Comments: Knee with bony deformity consistent with osteoarthritis.  Tenderness around the lateral greater than medial joint lines.  He lacks full extension and full flexion.  He has a large joint effusion.  Mild warmth.  No redness of the skin.  Skin:    General: Skin is warm and dry.  Neurological:     General: No focal deficit present.     Mental Status: He is alert. Mental status is at baseline.      UC Treatments / Results  Labs (all labs ordered are listed, but only abnormal results are displayed) Labs Reviewed - No data to display  EKG None  Radiology No results found.  Procedures Join Aspiration/Injection Date/Time: 02/10/2018 9:31 PM Performed by: Raylene Everts, MD Authorized by: Raylene Everts, MD   Consent:    Consent obtained:  Verbal   Consent given by:  Patient   Risks discussed:  Bleeding, infection and incomplete drainage    Alternatives discussed:  No treatment Location:    Location:  Knee   Knee:  L knee Anesthesia (see MAR for exact dosages):    Anesthesia method:  Local infiltration   Local anesthetic:  Lidocaine 1% WITH epi Procedure details:    Preparation: Patient was prepped and draped in usual sterile fashion     Needle gauge:  18 G   Ultrasound guidance: no     Approach:  Lateral   Aspirate amount:  80   Aspirate characteristics:  Clear and yellow   Steroid injected: yes     Specimen collected: no   Post-procedure details:    Dressing:  Adhesive bandage   Patient tolerance of procedure:  Tolerated well, no immediate complications Comments:     Immediate relief   (including critical care time)  Medications Ordered in UC Medications  methylPREDNISolone acetate (DEPO-MEDROL) injection 40 mg (40 mg Intra-articular Given 02/10/18 1455)    Initial Impression / Assessment and Plan / UC Course  I have reviewed the triage vital signs and the nursing notes.  Pertinent labs & imaging results that were available during my care of the patient were reviewed by me and considered in my medical decision making (see chart for details).     Discussed options.  Oral medicines.  Bracing.  Ice.  Offered injection and drainage.  Patient chooses to have fluid drained and inject steroid.  I explained to him that with end-stage arthritis that is bone-on-bone get a diminishing return from the steroid, it may not give him dramatic pain relief.They will help with the inflammation and swelling. Postinjection care is reviewed. I did recommend that he rethink an orthopedic consultation and knee replacements.  I think it would improve the quality of his life. Final Clinical Impressions(s) / UC Diagnoses   Final diagnoses:  Knee effusion, left  Primary osteoarthritis of left knee  Chronic pain of left knee     Discharge Instructions     Put ice on the knee for 20 min every couple of hours Limit walking to as  needed Stay off your feet a couple of days Follow up with your PCP   ED Prescriptions    None     Controlled Substance Prescriptions [place] Controlled Substance Registry consulted? Not Applicable   Raylene Everts, MD 02/10/18 2136

## 2018-02-10 NOTE — Discharge Instructions (Signed)
Put ice on the knee for 20 min every couple of hours Limit walking to as needed Stay off your feet a couple of days Follow up with your PCP

## 2018-02-10 NOTE — ED Triage Notes (Signed)
Pt c/o L knee pain and swelling, denies injury, states its red and swollen and worried about "fluid on my knee".

## 2018-02-17 ENCOUNTER — Other Ambulatory Visit: Payer: Self-pay | Admitting: Family Medicine

## 2018-02-17 DIAGNOSIS — M170 Bilateral primary osteoarthritis of knee: Secondary | ICD-10-CM

## 2018-03-11 ENCOUNTER — Encounter: Payer: Self-pay | Admitting: Family Medicine

## 2018-03-11 ENCOUNTER — Other Ambulatory Visit: Payer: Self-pay

## 2018-03-11 ENCOUNTER — Ambulatory Visit (INDEPENDENT_AMBULATORY_CARE_PROVIDER_SITE_OTHER): Payer: Medicare Other | Admitting: Family Medicine

## 2018-03-11 VITALS — BP 120/46 | HR 104 | Temp 98.0°F | Wt 185.1 lb

## 2018-03-11 DIAGNOSIS — M170 Bilateral primary osteoarthritis of knee: Secondary | ICD-10-CM | POA: Diagnosis not present

## 2018-03-11 DIAGNOSIS — I10 Essential (primary) hypertension: Secondary | ICD-10-CM | POA: Diagnosis not present

## 2018-03-11 LAB — POCT GLYCOSYLATED HEMOGLOBIN (HGB A1C): Hemoglobin A1C: 5.3 % (ref 4.0–5.6)

## 2018-03-11 MED ORDER — DULOXETINE HCL 30 MG PO CPEP: 30.0000 mg | ORAL_CAPSULE | Freq: Every day | ORAL | 0 refills | Status: DC

## 2018-03-11 MED ORDER — OXYCODONE-ACETAMINOPHEN 10-325 MG PO TABS: 1.0000 | ORAL_TABLET | Freq: Two times a day (BID) | ORAL | 0 refills | Status: DC | PRN

## 2018-03-11 NOTE — Patient Instructions (Addendum)
We were saw you today for your osteoarthritis and blood pressure.  Your blood pressure is well controlled.  For your osteoarthritis we are refilling your oxycodone.  We are also starting you on additional medication called Cymbalta.  Take this medication every day.  We will see back in 3 months.  We will consider doing a do injection at that visit.

## 2018-03-11 NOTE — Progress Notes (Signed)
Established Patient Office Visit  Subjective:  Patient ID: Joel Donaldson, male    DOB: 05-04-1940  Age: 78 y.o. MRN: [id number]  CC:  Chief Complaint  Patient presents with  . Follow-up    HPI Joel Donaldson presents for follow-up on bilateral knee osteoarthritis and pain medication refill and hypertension assessment.  PEG questionnaire Scale of 1-10 1. What number best describes your pain on average in the past week? 6 2. What number best describes how, during the past week, pain has interfered with your enjoyment of life? 8 3. What number best describes how, during the past week, pain has interfered with your general activity? 8 Osteoarthritis Patient still experiencing pain bilaterally both knees.  Pain worse at night.  Oxycodone make it better.  He says that his limb did not ability to move.  The pain does not radiate anywhere.  Hypertension BP is well controlled currently on lisinopril 20 mg.  Not experiencing any urinary problems.  Denies chest pain, shortness of breath, lower extremity swelling, headache, blurry vision.   Past Medical History:  Diagnosis Date  . Alcohol abuse   . Anxiety   . Arthritis    hands, back, knees  . Asthma   . Depression   . GERD (gastroesophageal reflux disease)   . Glaucoma   . Heart murmur asymptomatic  . Hx of radioisotope therapy 07/26/11   Radiosactive Prostate Seed Implant/ I-125 Seeds  . Hypertension   . Prostate cancer ([place]) dx'd 2016 or 2017   seed implants  . Rheumatoid arthritis ([place]) 10/10/2012    Past Surgical History:  Procedure Laterality Date  . CYSTOSCOPY  09/14/2011   Procedure: CYSTOSCOPY FLEXIBLE;  Surgeon: Hanley Ben, MD;  Location: [facility];  Service: Urology;  Laterality: N/A;  No seeds seen in the bladder  . LUMBAR FUSION  1995  . RADIOACTIVE SEED IMPLANT  09/14/2011   Procedure: RADIOACTIVE SEED IMPLANT;  Surgeon: Hanley Ben, MD;  Location: [facility];  Service:  Urology;  Laterality: N/A;  Total number of seeds -    . SHOULDER SURGERY  YRS AGO   LEFT    Family History  Problem Relation Age of Onset  . Diabetes Mother   . Heart disease Father        lived to 46 - had bypass, pacemaker    Social History   Socioeconomic History  . Marital status: Legally Separated    Spouse name: Deneise Lever  . Number of children: 1  . Years of education: 76  . Highest education level: Not on file  Occupational History  . Occupation: Retired    Fish farm manager: RETIRED  Social Needs  . Financial resource strain: Not on file  . Food insecurity:    Worry: Not on file    Inability: Not on file  . Transportation needs:    Medical: Not on file    Non-medical: Not on file  Tobacco Use  . Smoking status: Former Smoker    Packs/day: 2.00    Years: 50.00    Pack years: 100.00    Types: Cigarettes    Start date: 02/12/1953    Last attempt to quit: 10/13/2004    Years since quitting: 13.4  . Smokeless tobacco: Never Used  . Tobacco comment: Smoked 2 ppd  Substance and Sexual Activity  . Alcohol use: Yes    Alcohol/week: 14.0 standard drinks    Types: 14 Shots of liquor per week    Comment: 1/2 pint  of liquor daily  . Drug use: No  . Sexual activity: Not on file  Lifestyle  . Physical activity:    Days per week: Not on file    Minutes per session: Not on file  . Stress: Not on file  Relationships  . Social connections:    Talks on phone: Not on file    Gets together: Not on file    Attends religious service: Not on file    Active member of club or organization: Not on file    Attends meetings of clubs or organizations: Not on file    Relationship status: Not on file  . Intimate partner violence:    Fear of current or ex partner: Not on file    Emotionally abused: Not on file    Physically abused: Not on file    Forced sexual activity: Not on file  Other Topics Concern  . Not on file  Social History Narrative   Lives 1-story home with granddaughter and  great-granddaughter.    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       Health Care POA:    Emergency Contact: wife, Melton Walls (c) [phone]   End of Life Plan:    Who lives with you: wife and granddaughter   Any pets: 2 dogs, Precious and Spike   Diet: Pt has a varied diet of protein, starch, vegetables.  Pt does not have any kind of modified diet, eats what he likes.   Exercise: Pt does not have regular exercise routine.   Seatbelts: Pt reports wearing seatbelt when in vehicles.    Sun Exposure/Protection:    Hobbies: fishing, hunting           Outpatient Medications Prior to Visit  Medication Sig Dispense Refill  . albuterol (PROAIR HFA) 108 (90 Base) MCG/ACT inhaler INHALE 2 PUFFS INTO THE LUNGS EVERY 6 HOURS AS NEEDED FOR WHEEZING OR SHORTNESS OF BREATH 8.5 g 3  . aspirin EC 81 MG tablet Take 81 mg by mouth every morning.     . diclofenac sodium (VOLTAREN) 1 % GEL APPLY 2 GRAMS EXTERNALLY TO THE AFFECTED AREA FOUR TIMES DAILY AS NEEDED FOR PAIN 100 g 0  . fluticasone (FLONASE) 50 MCG/ACT nasal spray PLACE 2 SPRAYS INTO BOTH NOSTRILS DAILY 16 g 3  . lisinopril (PRINIVIL,ZESTRIL) 20 MG tablet Take 1 tablet (20 mg total) by mouth daily. 30 tablet 11  . nitroGLYCERIN (NITROSTAT) 0.4 MG SL tablet Place 0.4 mg under the tongue every 5 (five) minutes as needed for chest pain (chest pain).     Marland Kitchen omeprazole (PRILOSEC) 40 MG capsule Take 1 capsule (40 mg total) by mouth every morning. 90 capsule 3  . polyethylene glycol (MIRALAX / GLYCOLAX) packet Take 17 g by mouth daily as needed for mild constipation. 14 each 0  . oxyCODONE-acetaminophen (PERCOCET) 10-325 MG tablet Take 1 tablet by mouth every 12 (twelve) hours as needed for pain. 60 tablet 0  . oxyCODONE-acetaminophen (PERCOCET) 10-325 MG tablet Take 1 tablet by mouth every 12 (twelve) hours as needed for pain. 60 tablet 0  . oxyCODONE-acetaminophen (PERCOCET) 10-325 MG tablet  Take 1 tablet by mouth every 12 (twelve) hours as needed for pain. 60 tablet 0   No facility-administered medications prior to visit.     Allergies  Allergen Reactions  . Tramadol Nausea Only  . Pollen Extract Other (See Comments)    Sneezing    ROS As per HPI  Review of Systems  All other systems reviewed and are negative.     Objective:    Physical Exam  Constitutional: He appears well-developed and well-nourished.  HENT:  Head: Normocephalic and atraumatic.  Eyes: Pupils are equal, round, and reactive to light. EOM are normal.  Neck: Normal range of motion. No JVD present.  Cardiovascular: Normal rate.  Pulmonary/Chest: Effort normal.  Abdominal: Soft. He exhibits no distension. There is no abdominal tenderness.  Musculoskeletal:     Comments: Bilateral knees.  No obvious effusion or bruising.  Joints are stable.  Limited flexion extension due to pain.  Patient has ambulate with a cane.    BP (!) 120/46   Pulse (!) 104   Temp 98 F (36.7 C) (Oral)   Wt 185 lb 2 oz (84 kg)   SpO2 98%   BMI 26.56 kg/m  Wt Readings from Last 3 Encounters:  03/11/18 185 lb 2 oz (84 kg)  12/11/17 184 lb (83.5 kg)  10/02/17 183 lb 9.6 oz (83.3 kg)     There are no preventive care reminders to display for this patient.  There are no preventive care reminders to display for this patient.  Lab Results  Component Value Date   TSH 1.256 12/19/2011   Lab Results  Component Value Date   WBC 3.7 (L) 10/05/2016   HGB 12.5 (L) 10/05/2016   HCT 38.6 (L) 10/05/2016   MCV 86.4 10/05/2016   PLT 189 10/05/2016   Lab Results  Component Value Date   NA 137 10/05/2016   K 4.5 10/05/2016   CO2 22 10/05/2016   GLUCOSE 98 10/05/2016   BUN 5 (L) 10/05/2016   CREATININE 0.77 10/05/2016   BILITOT 0.6 02/17/2016   ALKPHOS 56 02/17/2016   AST 25 02/17/2016   ALT 21 02/17/2016   PROT 6.5 02/17/2016   ALBUMIN 4.2 02/17/2016   CALCIUM 8.6 (L) 10/05/2016   ANIONGAP 9 10/05/2016   Lab  Results  Component Value Date   CHOL 170 07/30/2017   Lab Results  Component Value Date   HDL 43 07/30/2017   Lab Results  Component Value Date   LDLCALC 80 07/30/2017   Lab Results  Component Value Date   TRIG 237 (H) 07/30/2017   Lab Results  Component Value Date   CHOLHDL 4.0 07/30/2017   Lab Results  Component Value Date   HGBA1C 5.3 03/11/2018      Assessment & Plan:   Problem List Items Addressed This Visit      Cardiovascular and Mediastinum   Essential hypertension - Primary    Well-controlled on current medication regimen.  Will assess renal function.  Screen for diabetes given history.      Relevant Orders   HgB A1c (Completed)   Basic Metabolic Panel     Musculoskeletal and Integument   Osteoarthritis of both knees    Bilateral knee osteoarthritis.  Pain currently controlled on on oxycodone 10/325 mg twice daily and diclofenac.  Patient not interested in getting to injection at this time.  Recommend trial of Cymbalta for chronic pain.  Patient follow-up in 3 months and consider re-discussing injection at that time.      Relevant Medications   DULoxetine (CYMBALTA) 30 MG capsule   oxyCODONE-acetaminophen (PERCOCET) 10-325 MG tablet   oxyCODONE-acetaminophen (PERCOCET) 10-325 MG tablet   oxyCODONE-acetaminophen (PERCOCET) 10-325 MG tablet      Meds ordered this encounter  Medications  . DULoxetine (CYMBALTA) 30 MG capsule    Sig: Take 1  capsule (30 mg total) by mouth daily for 30 days.    Dispense:  30 capsule    Refill:  0  . oxyCODONE-acetaminophen (PERCOCET) 10-325 MG tablet    Sig: Take 1 tablet by mouth every 12 (twelve) hours as needed for pain.    Dispense:  60 tablet    Refill:  0    Space refill by 30 days  . oxyCODONE-acetaminophen (PERCOCET) 10-325 MG tablet    Sig: Take 1 tablet by mouth every 12 (twelve) hours as needed for pain.    Dispense:  60 tablet    Refill:  0    Space refill by 30 days  . oxyCODONE-acetaminophen  (PERCOCET) 10-325 MG tablet    Sig: Take 1 tablet by mouth every 12 (twelve) hours as needed for pain.    Dispense:  60 tablet    Refill:  0    space refills by 30 days    Follow-up: Return in about 3 months (around 06/10/2018) for Knee OA and possible injections.    Bonnita Hollow, MD

## 2018-03-11 NOTE — Assessment & Plan Note (Signed)
Well-controlled on current medication regimen.  Will assess renal function.  Screen for diabetes given history.

## 2018-03-11 NOTE — Assessment & Plan Note (Signed)
Bilateral knee osteoarthritis.  Pain currently controlled on on oxycodone 10/325 mg twice daily and diclofenac.  Patient not interested in getting to injection at this time.  Recommend trial of Cymbalta for chronic pain.  Patient follow-up in 3 months and consider re-discussing injection at that time.

## 2018-03-12 ENCOUNTER — Telehealth: Payer: Self-pay

## 2018-03-12 DIAGNOSIS — M170 Bilateral primary osteoarthritis of knee: Secondary | ICD-10-CM

## 2018-03-12 LAB — BASIC METABOLIC PANEL
BUN/Creatinine Ratio: 9 — ABNORMAL LOW (ref 10–24)
BUN: 9 mg/dL (ref 8–27)
CO2: 22 mmol/L (ref 20–29)
Calcium: 9.5 mg/dL (ref 8.6–10.2)
Chloride: 103 mmol/L (ref 96–106)
Creatinine, Ser: 0.97 mg/dL (ref 0.76–1.27)
GFR calc Af Amer: 86 mL/min/{1.73_m2} (ref >59)
GFR, EST NON AFRICAN AMERICAN: 74 mL/min/{1.73_m2} (ref >59)
Glucose: 90 mg/dL (ref 65–99)
Potassium: 4.3 mmol/L (ref 3.5–5.2)
Sodium: 140 mmol/L (ref 134–144)

## 2018-03-12 NOTE — Telephone Encounter (Signed)
Can you call the pharmacy see what the issue was? Each Rx said space by 30 days.

## 2018-03-12 NOTE — Telephone Encounter (Signed)
Patient came to office, pharmacy would not accept but one printed prescription of Percocet because they all had a start date of 03/11/18.  Note to PCP to escribe the other prescriptions with a start date of 04/11/18 and 05/10/18. Printed prescriptions given to nurse.  Danley Danker, RN [facility] RN)

## 2018-03-13 NOTE — Telephone Encounter (Signed)
Spoke with pharmacy and the issue yesterday when patient went to fill his percocet script is that he was too early.  It is not due to be refilled until 03-17-2018.  Pharmacy states that as of February 12, 2018 percocet, oxycodone, hydrocodone, etc. Should be e-prescribed.  Will forward to MD to resend the scripts for March and April as they accepted the paper copy for February.  It is ok to write in the comment section to space every 30 days or fill 30 days from previous script.  Detta Mellin,CMA

## 2018-03-14 MED ORDER — OXYCODONE-ACETAMINOPHEN 10-325 MG PO TABS: 1.0000 | ORAL_TABLET | Freq: Two times a day (BID) | ORAL | 0 refills | Status: DC | PRN

## 2018-03-14 NOTE — Telephone Encounter (Signed)
Tried calling patient but number just rang.  Joel Donaldson,CMA

## 2018-04-02 ENCOUNTER — Ambulatory Visit (INDEPENDENT_AMBULATORY_CARE_PROVIDER_SITE_OTHER): Payer: Medicare Other | Admitting: Family Medicine

## 2018-04-02 VITALS — BP 127/80 | HR 85 | Temp 99.3°F | Wt 187.4 lb

## 2018-04-02 DIAGNOSIS — R69 Illness, unspecified: Secondary | ICD-10-CM | POA: Diagnosis not present

## 2018-04-02 DIAGNOSIS — J111 Influenza due to unidentified influenza virus with other respiratory manifestations: Secondary | ICD-10-CM

## 2018-04-02 NOTE — Assessment & Plan Note (Signed)
Constellations of symptoms and signs concerning for influenza.  Outside of Tamiflu window.  No high risk individuals living with patient.  No signs of bacterial infection. - Discussed conservative management with Tylenol and honey - Reviewed return precautions, RTC as needed

## 2018-04-02 NOTE — Progress Notes (Signed)
   Subjective   Patient ID: Joel Donaldson    DOB: [birth year], 78 y.o. male   MRN: [id number]  CC: "I think I have a cold or the flu"  HPI: Joel Donaldson is a 78 y.o. male who presents to clinic today for the following:  URI  Has been sick for 6 days. Gradual onset Nasal discharge: sometimes Medications tried: Robitussin with some relief Sick contacts: unsure  Symptoms Fever: subjective Headache or face pain: headache Tooth pain: no Sneezing: no Scratchy throat: yes Allergies: no Muscle aches: no but has knee pain c/w prior RA Severe fatigue: yes Stiff neck: no Shortness of breath: no Rash: no Sore throat or swollen glands: yes  Of note, patient did receive a flu shot this year.  He lives at home with his granddaughter who has no medical problems and also received a flu shot.  ROS: see HPI for pertinent.  [place]: Reviewed. Smoking status reviewed. Medications reviewed.  Objective   BP 127/80   Pulse 85   Temp 99.3 F (37.4 C) (Oral)   Wt 187 lb 6.4 oz (85 kg)   SpO2 97%   BMI 26.89 kg/m  Vitals and nursing note reviewed.  General: well nourished, well developed, NAD with non-toxic appearance HEENT: normocephalic, atraumatic, moist mucous membranes, patent ear canals with gray TMs, erythematous oropharynx without tonsillar edema, no nasal sinus tenderness Neck: supple, non-tender without lymphadenopathy Cardiovascular: regular rate and rhythm without murmurs, rubs, or gallops Lungs: clear to auscultation bilaterally with normal work of breathing Skin: warm, dry, no rashes or lesions, cap refill < 2 seconds Extremities: warm and well perfused, normal tone, no edema  Assessment & Plan   Influenza-like illness Constellations of symptoms and signs concerning for influenza.  Outside of Tamiflu window.  No high risk individuals living with patient.  No signs of bacterial infection. - Discussed conservative management with Tylenol and honey - Reviewed return  precautions, RTC as needed  No orders of the defined types were placed in this encounter.  No orders of the defined types were placed in this encounter.   Harriet Butte, [place], PGY-3 04/02/2018, 3:46 PM

## 2018-04-02 NOTE — Patient Instructions (Signed)
Thank you for coming in to see [place] today. Please see below to review our plan for today's visit.  Your symptoms are consistent with a viral upper respiratory tract infection.  This should improve without need for antibiotics.  You can use Tylenol and ibuprofen every 8 hours as needed for fever.  Sometimes this is associated with a cough which can linger beyond the duration of other symptoms (on average 18 days).  A tablespoon of honey 3 times daily can help alleviate coughing symptoms.  Please call the clinic at [phone] if your symptoms worsen or you have any concerns. It was our pleasure to serve you.  Harriet Butte, [place], PGY-3

## 2018-04-10 ENCOUNTER — Other Ambulatory Visit: Payer: Self-pay | Admitting: Family Medicine

## 2018-04-10 DIAGNOSIS — I10 Essential (primary) hypertension: Secondary | ICD-10-CM

## 2018-05-02 ENCOUNTER — Other Ambulatory Visit: Payer: Self-pay | Admitting: Family Medicine

## 2018-05-02 DIAGNOSIS — M170 Bilateral primary osteoarthritis of knee: Secondary | ICD-10-CM

## 2018-05-02 DIAGNOSIS — J449 Chronic obstructive pulmonary disease, unspecified: Secondary | ICD-10-CM

## 2018-05-26 ENCOUNTER — Ambulatory Visit (INDEPENDENT_AMBULATORY_CARE_PROVIDER_SITE_OTHER): Payer: Medicare Other | Admitting: Family Medicine

## 2018-05-26 ENCOUNTER — Telehealth: Payer: Self-pay

## 2018-05-26 ENCOUNTER — Other Ambulatory Visit: Payer: Self-pay

## 2018-05-26 VITALS — BP 108/60 | HR 110 | Temp 98.6°F | Wt 181.0 lb

## 2018-05-26 DIAGNOSIS — M170 Bilateral primary osteoarthritis of knee: Secondary | ICD-10-CM | POA: Diagnosis not present

## 2018-05-26 MED ORDER — OXYCODONE-ACETAMINOPHEN 10-325 MG PO TABS: 1.0000 | ORAL_TABLET | Freq: Two times a day (BID) | ORAL | 0 refills | Status: DC | PRN

## 2018-05-26 NOTE — Telephone Encounter (Signed)
Pt LVM on nurse line requesting an apt with pcp for pain management. Spoke with Grandville Silos, ok to put him on ATC this afternoon. Pt contacted and apt made.

## 2018-05-26 NOTE — Progress Notes (Signed)
Established Patient Office Visit  Subjective:  Patient ID: Joel Donaldson, male    DOB: 1940/03/16  Age: 78 y.o. MRN: [id number]  CC:  Chief Complaint  Patient presents with  . Medication Refill    HPI Joel Donaldson presents for OA pain medicine refill.   Bilateral OA of Knees:   PEG: 6,6,6. Patient tried Cymbalta 30 mg w/o help. No side effects. Still using Voltaren gel and oxycodone 10-325 mg q12h. Takes two every day. Patient interested in talking to orthopedic surgeon for knee. Ambulates with cane.   Past Medical History:  Diagnosis Date  . Alcohol abuse   . Anxiety   . Arthritis    hands, back, knees  . Asthma   . Depression   . GERD (gastroesophageal reflux disease)   . Glaucoma   . Heart murmur asymptomatic  . Hx of radioisotope therapy 07/26/11   Radiosactive Prostate Seed Implant/ I-125 Seeds  . Hypertension   . Prostate cancer ([place]) dx'd 2016 or 2017   seed implants  . Rheumatoid arthritis ([place]) 10/10/2012    Past Surgical History:  Procedure Laterality Date  . CYSTOSCOPY  09/14/2011   Procedure: CYSTOSCOPY FLEXIBLE;  Surgeon: Hanley Ben, MD;  Location: [facility];  Service: Urology;  Laterality: N/A;  No seeds seen in the bladder  . LUMBAR FUSION  1995  . RADIOACTIVE SEED IMPLANT  09/14/2011   Procedure: RADIOACTIVE SEED IMPLANT;  Surgeon: Hanley Ben, MD;  Location: [facility];  Service: Urology;  Laterality: N/A;  Total number of seeds -    . SHOULDER SURGERY  YRS AGO   LEFT    Family History  Problem Relation Age of Onset  . Diabetes Mother   . Heart disease Father        lived to 18 - had bypass, pacemaker    Social History   Socioeconomic History  . Marital status: Legally Separated    Spouse name: Deneise Lever  . Number of children: 1  . Years of education: 45  . Highest education level: Not on file  Occupational History  . Occupation: Retired    Fish farm manager: RETIRED  Social Needs  . Financial  resource strain: Not on file  . Food insecurity:    Worry: Not on file    Inability: Not on file  . Transportation needs:    Medical: Not on file    Non-medical: Not on file  Tobacco Use  . Smoking status: Former Smoker    Packs/day: 2.00    Years: 50.00    Pack years: 100.00    Types: Cigarettes    Start date: 02/12/1953    Last attempt to quit: 10/13/2004    Years since quitting: 13.6  . Smokeless tobacco: Never Used  . Tobacco comment: Smoked 2 ppd  Substance and Sexual Activity  . Alcohol use: Yes    Alcohol/week: 14.0 standard drinks    Types: 14 Shots of liquor per week    Comment: 1/2 pint of liquor daily  . Drug use: No  . Sexual activity: Not on file  Lifestyle  . Physical activity:    Days per week: Not on file    Minutes per session: Not on file  . Stress: Not on file  Relationships  . Social connections:    Talks on phone: Not on file    Gets together: Not on file    Attends religious service: Not on file    Active member of club or  organization: Not on file    Attends meetings of clubs or organizations: Not on file    Relationship status: Not on file  . Intimate partner violence:    Fear of current or ex partner: Not on file    Emotionally abused: Not on file    Physically abused: Not on file    Forced sexual activity: Not on file  Other Topics Concern  . Not on file  Social History Narrative   Lives 1-story home with granddaughter and great-granddaughter.    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       Health Care POA:    Emergency Contact: wife, Joel Donaldson (c) [phone]   End of Life Plan:    Who lives with you: wife and granddaughter   Any pets: 2 dogs, Precious and Spike   Diet: Pt has a varied diet of protein, starch, vegetables.  Pt does not have any kind of modified diet, eats what he likes.   Exercise: Pt does not have regular exercise routine.   Seatbelts: Pt reports wearing  seatbelt when in vehicles.    Sun Exposure/Protection:    Hobbies: fishing, hunting           Outpatient Medications Prior to Visit  Medication Sig Dispense Refill  . albuterol (PROVENTIL HFA;VENTOLIN HFA) 108 (90 Base) MCG/ACT inhaler INHALE 2 PUFFS INTO THE LUNGS EVERY 6 HOURS AS NEEDED FOR WHEEZING OR SHORTNESS OF BREATH 8.5 g 3  . aspirin EC 81 MG tablet Take 81 mg by mouth every morning.     . diclofenac sodium (VOLTAREN) 1 % GEL APPLY 2 GRAMS EXTERNALLY TO THE AFFECTED AREA FOUR TIMES DAILY AS NEEDED FOR PAIN 100 g 0  . fluticasone (FLONASE) 50 MCG/ACT nasal spray PLACE 2 SPRAYS INTO BOTH NOSTRILS DAILY 16 g 3  . lisinopril (PRINIVIL,ZESTRIL) 20 MG tablet TAKE 1 TABLET(20 MG) BY MOUTH DAILY 90 tablet 3  . nitroGLYCERIN (NITROSTAT) 0.4 MG SL tablet Place 0.4 mg under the tongue every 5 (five) minutes as needed for chest pain (chest pain).     Marland Kitchen omeprazole (PRILOSEC) 40 MG capsule Take 1 capsule (40 mg total) by mouth every morning. 90 capsule 3  . polyethylene glycol (MIRALAX / GLYCOLAX) packet Take 17 g by mouth daily as needed for mild constipation. 14 each 0  . DULoxetine (CYMBALTA) 30 MG capsule Take 1 capsule (30 mg total) by mouth daily for 30 days. 30 capsule 0  . oxyCODONE-acetaminophen (PERCOCET) 10-325 MG tablet Take 1 tablet by mouth every 12 (twelve) hours as needed for pain. 60 tablet 0  . oxyCODONE-acetaminophen (PERCOCET) 10-325 MG tablet Take 1 tablet by mouth every 12 (twelve) hours as needed for pain. 60 tablet 0  . oxyCODONE-acetaminophen (PERCOCET) 10-325 MG tablet Take 1 tablet by mouth every 12 (twelve) hours as needed for pain. 60 tablet 0   No facility-administered medications prior to visit.     Allergies  Allergen Reactions  . Tramadol Nausea Only  . Pollen Extract Other (See Comments)    Sneezing    ROS Review of Systems    Objective:    Physical Exam  Constitutional: He is oriented to person, place, and time. He appears well-developed and  well-nourished.  HENT:  Head: Normocephalic and atraumatic.  Eyes: Pupils are equal, round, and reactive to light. Right eye exhibits no discharge. Left eye exhibits no discharge.  Pulmonary/Chest: Effort normal and breath sounds normal. No respiratory distress.  Abdominal:  Soft. He exhibits no distension.  Musculoskeletal: Normal range of motion.        General: No tenderness, deformity or edema.     Comments: Compression stockings in place up to knees. Ambulates with cane  Neurological: He is alert and oriented to person, place, and time.  Skin: Skin is warm and dry.  Psychiatric: He has a normal mood and affect. His behavior is normal.  Vitals reviewed.   BP 108/60   Pulse (!) 110   Temp 98.6 F (37 C) (Oral)   Wt 181 lb (82.1 kg)   SpO2 98%   BMI 25.97 kg/m  Wt Readings from Last 3 Encounters:  05/26/18 181 lb (82.1 kg)  04/02/18 187 lb 6.4 oz (85 kg)  03/11/18 185 lb 2 oz (84 kg)     There are no preventive care reminders to display for this patient.  There are no preventive care reminders to display for this patient.  Lab Results  Component Value Date   TSH 1.256 12/19/2011   Lab Results  Component Value Date   WBC 3.7 (L) 10/05/2016   HGB 12.5 (L) 10/05/2016   HCT 38.6 (L) 10/05/2016   MCV 86.4 10/05/2016   PLT 189 10/05/2016   Lab Results  Component Value Date   NA 140 03/11/2018   K 4.3 03/11/2018   CO2 22 03/11/2018   GLUCOSE 90 03/11/2018   BUN 9 03/11/2018   CREATININE 0.97 03/11/2018   BILITOT 0.6 02/17/2016   ALKPHOS 56 02/17/2016   AST 25 02/17/2016   ALT 21 02/17/2016   PROT 6.5 02/17/2016   ALBUMIN 4.2 02/17/2016   CALCIUM 9.5 03/11/2018   ANIONGAP 9 10/05/2016   Lab Results  Component Value Date   CHOL 170 07/30/2017   Lab Results  Component Value Date   HDL 43 07/30/2017   Lab Results  Component Value Date   LDLCALC 80 07/30/2017   Lab Results  Component Value Date   TRIG 237 (H) 07/30/2017   Lab Results  Component  Value Date   CHOLHDL 4.0 07/30/2017   Lab Results  Component Value Date   HGBA1C 5.3 03/11/2018      Assessment & Plan:   Problem List Items Addressed This Visit      Musculoskeletal and Integument   Osteoarthritis of both knees - Primary   Relevant Medications   oxyCODONE-acetaminophen (PERCOCET) 10-325 MG tablet (Start on 07/26/2018)   oxyCODONE-acetaminophen (PERCOCET) 10-325 MG tablet (Start on 06/25/2018)   oxyCODONE-acetaminophen (PERCOCET) 10-325 MG tablet   Other Relevant Orders   Ambulatory referral to Orthopedic Surgery    Bilateral OA. Stable on oxycodone 10-325 mg q12h prn and diclofenac gel. Interested in seeing orthopedics for eval for knee replacement. Refill medications. Referral placed. F/U in 3 mo.   Meds ordered this encounter  Medications  . oxyCODONE-acetaminophen (PERCOCET) 10-325 MG tablet    Sig: Take 1 tablet by mouth every 12 (twelve) hours as needed for pain.    Dispense:  60 tablet    Refill:  0    Space refills by 30 days  . oxyCODONE-acetaminophen (PERCOCET) 10-325 MG tablet    Sig: Take 1 tablet by mouth every 12 (twelve) hours as needed for pain.    Dispense:  60 tablet    Refill:  0  . oxyCODONE-acetaminophen (PERCOCET) 10-325 MG tablet    Sig: Take 1 tablet by mouth every 12 (twelve) hours as needed for pain.    Dispense:  60 tablet  Refill:  0    Follow-up: Return in about 3 months (around 08/25/2018) for OA, pain med refill.    Bonnita Hollow, MD

## 2018-05-26 NOTE — Patient Instructions (Signed)
It was a pleasure to see you today! Thank you for choosing [facility] for your primary care. Joel Donaldson was seen for refill of Osteoarthritis medication. We are also referring you to orthopedic surgery to discuss a possible knee replacement surgery.  Come back to the clinic in 3 month for check up.   Best,  Marny Lowenstein, MD, MS FAMILY MEDICINE RESIDENT - PGY2 05/26/2018 2:05 PM

## 2018-06-10 ENCOUNTER — Ambulatory Visit (INDEPENDENT_AMBULATORY_CARE_PROVIDER_SITE_OTHER): Payer: Self-pay | Admitting: Orthopaedic Surgery

## 2018-06-16 ENCOUNTER — Ambulatory Visit: Payer: Self-pay | Admitting: Orthopaedic Surgery

## 2018-07-27 ENCOUNTER — Other Ambulatory Visit: Payer: Self-pay | Admitting: Family Medicine

## 2018-08-26 ENCOUNTER — Other Ambulatory Visit: Payer: Self-pay

## 2018-08-26 ENCOUNTER — Encounter: Payer: Self-pay | Admitting: Family Medicine

## 2018-08-26 ENCOUNTER — Ambulatory Visit (INDEPENDENT_AMBULATORY_CARE_PROVIDER_SITE_OTHER): Payer: Medicare Other | Admitting: Family Medicine

## 2018-08-26 DIAGNOSIS — M170 Bilateral primary osteoarthritis of knee: Secondary | ICD-10-CM

## 2018-08-26 MED ORDER — OXYCODONE-ACETAMINOPHEN 10-325 MG PO TABS: 1.0000 | ORAL_TABLET | Freq: Two times a day (BID) | ORAL | 0 refills | Status: DC | PRN

## 2018-08-26 NOTE — Assessment & Plan Note (Signed)
Chronic pain, well controlled with oxycodone and diclofenac gel.   -Refill oxycodone for 3 months -Keep appointment with orthopedic -Referral to sports medicine for joint injection of knee -Follow-up in 3 months

## 2018-08-26 NOTE — Progress Notes (Signed)
    Subjective:  Joel Donaldson is a 78 y.o. male who presents to the [facility] today with a chief complaint of bilateral knee pain due to osteoarthritis  HPI: Primary osteoarthritis of both knees, chronic, stable PEG score: 6, 6, 6 Patient's pain is well controlled on diclofenac gel 4 times daily, oxycodone 10-325 milligrams twice daily as needed pain.  Patient takes 2 a day.  He does not need more does not need less of this.  Pain is a getting worse pain is not getting less.  Patient ambulates with cane.  Patient has been in contact with orthopedics for potential knee replacement, he said that due to [place] they had to reschedule his appointment and is supposed to be within the next few weeks.  He does report some additional swelling in his left knee.  He is not interested in trying new medications for chronic pain.  He has had steroid injections in the past before, he is interested in getting another one.  He is been to sports medicine for this.  He is open to referral.  COPD Patient reports stable breathing, does not use rescue inhaler  ROS: Per HPI  Objective:  Physical Exam: BP 124/64   Pulse (!) 117   SpO2 98%   Repeat HR 80 on exam Gen: NAD, ambulates with cane, able to get up and go out of chair CV: RRR with no murmurs appreciated Pulm: NWOB,  MSK: Full range of motion with extension and flexion although there is some crepitus bilaterally, there is some mild swelling of the left knee compared to the right, no effusion, no focal tenderness Skin: warm, dry Neuro: moves all extremities Psych: Normal affect and thought content  No results found for this or any previous visit (from the past 72 hour(s)).   Assessment/Plan:  Osteoarthritis of both knees Chronic pain, well controlled with oxycodone and diclofenac gel.   -Refill oxycodone for 3 months -Keep appointment with orthopedic -Referral to sports medicine for joint injection of knee -Follow-up in 3 months   Lab Orders  No  laboratory test(s) ordered today    Meds ordered this encounter  Medications  . oxyCODONE-acetaminophen (PERCOCET) 10-325 MG tablet    Sig: Take 1 tablet by mouth every 12 (twelve) hours as needed for pain.    Dispense:  60 tablet    Refill:  0    Space refills by 30 days  . oxyCODONE-acetaminophen (PERCOCET) 10-325 MG tablet    Sig: Take 1 tablet by mouth every 12 (twelve) hours as needed for pain.    Dispense:  60 tablet    Refill:  0    Space refill for 30 days  . oxyCODONE-acetaminophen (PERCOCET) 10-325 MG tablet    Sig: Take 1 tablet by mouth every 12 (twelve) hours as needed for pain.    Dispense:  60 tablet    Refill:  0    Space refill for 30 days    Marny Lowenstein, MD, MS FAMILY MEDICINE RESIDENT - PGY2 08/26/2018 3:47 PM

## 2018-09-19 ENCOUNTER — Other Ambulatory Visit: Payer: Self-pay

## 2018-09-19 ENCOUNTER — Ambulatory Visit (INDEPENDENT_AMBULATORY_CARE_PROVIDER_SITE_OTHER): Payer: Medicare Other | Admitting: Family Medicine

## 2018-09-19 ENCOUNTER — Encounter: Payer: Self-pay | Admitting: Family Medicine

## 2018-09-19 VITALS — BP 124/78 | Ht 70.0 in | Wt 180.0 lb

## 2018-09-19 DIAGNOSIS — M170 Bilateral primary osteoarthritis of knee: Secondary | ICD-10-CM

## 2018-09-19 MED ORDER — METHYLPREDNISOLONE ACETATE 40 MG/ML IJ SUSP
40.0000 mg | Freq: Once | INTRAMUSCULAR | Status: AC
  Administered 2018-09-19: 40 mg via INTRA_ARTICULAR

## 2018-09-19 NOTE — Patient Instructions (Addendum)
You can ice your knees tonight and tomorrow Make a follow up appointment in 6 weeks Call if any questions or concerns

## 2018-09-19 NOTE — Progress Notes (Signed)
SUBJECTIVE:      Chief Complaint:  Bilateral knee osteoarthritis   HPI:  Joel Donaldson is a 78 year old African-American male presenting via [facility] Grandville Silos[facility].  Patient complains of bilateral knee pain for "many years", left worse than right.  Aggravating factors include walking and pain worse at the end of the day.  Is currently using topical anti-inflammatory cream, and is taking oxycodone twice daily as needed from his primary care doctor.  He was referred to surgery a few months ago, but due to [place] was never able to go to appointment.  He is hesitant about pursuing surgical options, as his wife had a TKA and is still having a lot of pain and difficulty walking.  Has had steroid injections in the past, and endorsed relief.  Today he is complaining of pain becoming more constant.  He is utilizing a brace on both knees that offers little benefit.  States the Percocet is the only way he gets relief.  He denies weakness, but does state he feels sometimes his knee will give out.  He is not having any numbness, radiating pain, skin changes.  Patient last had an x-ray in April 2018 that showed end-stage osteoarthritis with bone-on-bone appearance with medial sclerotic changes.     ROS:     See HPI.    PERTINENT  PMH / PSH / FH / SH:  Past Medical, Surgical, Social, and Family History Reviewed & Updated in the EMR.  Pertinent findings include:  Prostate cancer, rheumatoid arthritis, vitamin D deficiency, opioid dependence   OBJECTIVE:  BP 124/78   Ht 5\' 10"  (1.778 m)   Wt 180 lb (81.6 kg)   BMI 25.83 kg/m  Physical Exam:  Vital signs are reviewed.   GEN: Alert and oriented, NAD Pulm: Breathing unlabored PSY: normal mood, congruent affect  MSK: Bilateral knee: Moderate amount of swelling to lateral compartments of the knee, right worse than left. Palpation with no warmth, there is joint line tenderness, patellar tenderness, and condyle tenderness. ROM full in  flexion and extension with crepitus and lower leg rotation. Ligaments with solid consistent endpoints including ACL, PCL, LCL, MCL. Negative Mcmurray's, but positive Thessaly tests. Non painful patellar compression. Patellar glide with crepitus. Patellar and quadriceps tendons unremarkable. Hamstring and quadriceps strength is normal.  Procedure: After informed written consent timeout was performed, patient was seated on exam table. Right knee was prepped with alcohol swab and utilizing anteromedial approach, patient's right knee was injected intraarticularly with 4:1 lidocaine: depomedrol. Patient tolerated the procedure well without immediate complications.  After informed written consent timeout was performed, patient was seated on exam table. Left knee was prepped with alcohol swab and utilizing anteromedial approach, patient's right knee was injected intraarticularly with 4:1 1% lidocaine: depomedrol. Patient tolerated the procedure well without immediate complications.    ASSESSMENT & PLAN:   1. Bilateral osteoarthritis of knees Successful injections in both knees as above.  Patient has hesitation about meeting with surgeon, we will follow-up in 6 weeks at which time we can assess how well these injections worked for him.  Can consider referral at that time versus formal physical therapy.  His pain medication regimen is under the care of his primary care doctor.  Joel Clam, DO, ATC Sports Medicine Fellow

## 2018-09-22 NOTE — Progress Notes (Signed)
[facility]: Attending Note: I have reviewed the chart, discussed wit the Sports Medicine Fellow. I agree with assessment and treatment plan as detailed in the [place] note. I was present and supervised the left knee injection and I performed the right knee injection.

## 2018-10-31 ENCOUNTER — Ambulatory Visit: Payer: Medicare Other | Admitting: Family Medicine

## 2018-11-18 ENCOUNTER — Other Ambulatory Visit: Payer: Self-pay | Admitting: Family Medicine

## 2018-11-24 ENCOUNTER — Other Ambulatory Visit: Payer: Self-pay

## 2018-11-24 ENCOUNTER — Encounter: Payer: Self-pay | Admitting: Family Medicine

## 2018-11-24 ENCOUNTER — Ambulatory Visit (INDEPENDENT_AMBULATORY_CARE_PROVIDER_SITE_OTHER): Payer: Medicare Other | Admitting: Family Medicine

## 2018-11-24 VITALS — BP 126/62 | HR 102 | Ht 70.0 in | Wt 181.0 lb

## 2018-11-24 DIAGNOSIS — Z23 Encounter for immunization: Secondary | ICD-10-CM | POA: Diagnosis not present

## 2018-11-24 DIAGNOSIS — Z7689 Persons encountering health services in other specified circumstances: Secondary | ICD-10-CM | POA: Diagnosis not present

## 2018-11-24 DIAGNOSIS — M170 Bilateral primary osteoarthritis of knee: Secondary | ICD-10-CM | POA: Diagnosis not present

## 2018-11-24 DIAGNOSIS — M5442 Lumbago with sciatica, left side: Secondary | ICD-10-CM

## 2018-11-24 DIAGNOSIS — G8929 Other chronic pain: Secondary | ICD-10-CM

## 2018-11-24 DIAGNOSIS — J449 Chronic obstructive pulmonary disease, unspecified: Secondary | ICD-10-CM

## 2018-11-24 DIAGNOSIS — M5441 Lumbago with sciatica, right side: Secondary | ICD-10-CM

## 2018-11-24 MED ORDER — OXYCODONE-ACETAMINOPHEN 10-325 MG PO TABS: 1.0000 | ORAL_TABLET | Freq: Two times a day (BID) | ORAL | 0 refills | Status: DC | PRN

## 2018-11-24 MED ORDER — ALBUTEROL SULFATE HFA 108 (90 BASE) MCG/ACT IN AERS: 1.0000 | INHALATION_SPRAY | RESPIRATORY_TRACT | 3 refills | Status: DC | PRN

## 2018-11-24 NOTE — Assessment & Plan Note (Addendum)
OA stable on current regimen.  Will continue.  We will get UDS per protocol.

## 2018-11-24 NOTE — Assessment & Plan Note (Signed)
Patient with chronic back pain.  Worse over the past month.  Patient requiring additional oxycodone for pain.  Recommend getting x-ray lumbar film, especially given patient's history of prostate cancer.  Also recommend starting physical therapy for pain.

## 2018-11-24 NOTE — Progress Notes (Signed)
Subjective:  Joel Donaldson is a 78 y.o. male who presents to the [facility] today with a chief complaint of bilateral knee OA and back pain.   HPI:  Bilateral knee OA Patient with bilateral OA.  Pain is well controlled with oxycodone 10-325 mg every 12 hours as needed pain, diclofenac gel.  Patiently recently had bilateral corticosteroid injections.  Reports that he has had some improvement in his knee pain this.  Patient has offered referral to see orthopedics, but patient is hesitant to go.  Patient last UDS 09/2017.  It was negative at that time for opioids.  Worsening lower back pain Patient reports about 1 month of worsening lower back pain.  It is worse with movement.  Patient denies any trauma to his back or any falls.  Denies any weakness in his lower extremities or bowel or bladder incontinence.  Patient does have history of prostate cancer.  Is been several years since he followed up with his urologist.  Patient he had taken extra oxycodone to help with the pain sometimes.  Medication refill Patient needs refill on his albuterol.  ROS: Per HPI  PMH: Smoking history reviewed.    Objective:  Physical Exam: BP 126/62   Pulse (!) 102   Ht 5\' 10"  (1.778 m)   Wt 181 lb (82.1 kg)   SpO2 96%   BMI 25.97 kg/m   Gen: NAD, resting comfortably Pulm: NWOB,  MSK: Bilateral knees without joint effusion or gross deformity, able to ambulate without a cane, however does use cane for support, able to get up on exam table, 5 of 5 strength in lower extremities, lower back nontender to palpation Skin: warm, dry Neuro: grossly normal, moves all extremities Psych: Normal affect and thought content  No results found for this or any previous visit (from the past 72 hour(s)).   Assessment/Plan:  Osteoarthritis of both knees OA stable on current regimen.  Will continue.  We will get UDS per protocol.  Back pain Patient with chronic back pain.  Worse over the past month.  Patient requiring  additional oxycodone for pain.  Recommend getting x-ray lumbar film, especially given patient's history of prostate cancer.  Also recommend starting physical therapy for pain.    Lab Orders     ToxASSURE Select 13 (MW), Urine  Meds ordered this encounter  Medications  . albuterol (VENTOLIN HFA) 108 (90 Base) MCG/ACT inhaler    Sig: Inhale 1-2 puffs into the lungs every 4 (four) hours as needed for wheezing or shortness of breath.    Dispense:  8.5 g    Refill:  3  . oxyCODONE-acetaminophen (PERCOCET) 10-325 MG tablet    Sig: Take 1 tablet by mouth every 12 (twelve) hours as needed for pain.    Dispense:  60 tablet    Refill:  0    Space refills by 30 days  . oxyCODONE-acetaminophen (PERCOCET) 10-325 MG tablet    Sig: Take 1 tablet by mouth every 12 (twelve) hours as needed for pain.    Dispense:  60 tablet    Refill:  0    Space refill for 30 days  . oxyCODONE-acetaminophen (PERCOCET) 10-325 MG tablet    Sig: Take 1 tablet by mouth every 12 (twelve) hours as needed for pain.    Dispense:  60 tablet    Refill:  0    Space refill for 30 days      Marny Lowenstein, MD, MS FAMILY MEDICINE RESIDENT - PGY3 11/24/2018 5:10  PM

## 2018-11-24 NOTE — Patient Instructions (Addendum)
It was a pleasure to see you today! Thank you for choosing [facility] for your primary care. Joel Donaldson was seen for back and knee pain.   1.  For your back pain, we are getting x-rays and we are going to send you to physical therapy.  You may continue take your oxycodone as needed for back pain.  2.  For your knee pain, we refilled your oxycodone.  Physical therapy may also help with this as well.  We are getting a urine drug screen as we normally do.   Best,  Marny Lowenstein, MD, MS FAMILY MEDICINE RESIDENT - PGY3 11/24/2018 2:29 PM

## 2018-11-29 LAB — TOXASSURE SELECT 13 (MW), URINE

## 2018-12-06 LAB — COMPLIANCE DRUG ANALYSIS, UR

## 2018-12-06 LAB — SPECIMEN STATUS REPORT

## 2018-12-16 ENCOUNTER — Ambulatory Visit: Payer: Medicare Other | Attending: Family Medicine | Admitting: Physical Therapy

## 2018-12-24 ENCOUNTER — Ambulatory Visit: Payer: Medicare Other | Admitting: Family Medicine

## 2018-12-30 ENCOUNTER — Ambulatory Visit: Payer: Medicare Other | Admitting: Family Medicine

## 2019-03-09 ENCOUNTER — Other Ambulatory Visit: Payer: Self-pay

## 2019-03-09 ENCOUNTER — Encounter: Payer: Self-pay | Admitting: Family Medicine

## 2019-03-09 ENCOUNTER — Ambulatory Visit (INDEPENDENT_AMBULATORY_CARE_PROVIDER_SITE_OTHER): Payer: Medicare Other | Admitting: Family Medicine

## 2019-03-09 VITALS — BP 160/90 | HR 102 | Wt 178.2 lb

## 2019-03-09 DIAGNOSIS — M170 Bilateral primary osteoarthritis of knee: Secondary | ICD-10-CM | POA: Diagnosis not present

## 2019-03-09 DIAGNOSIS — J449 Chronic obstructive pulmonary disease, unspecified: Secondary | ICD-10-CM

## 2019-03-09 DIAGNOSIS — F1129 Opioid dependence with unspecified opioid-induced disorder: Secondary | ICD-10-CM

## 2019-03-09 DIAGNOSIS — I10 Essential (primary) hypertension: Secondary | ICD-10-CM

## 2019-03-09 MED ORDER — ALBUTEROL SULFATE HFA 108 (90 BASE) MCG/ACT IN AERS: 1.0000 | INHALATION_SPRAY | RESPIRATORY_TRACT | 3 refills | Status: DC | PRN

## 2019-03-09 MED ORDER — NAPROXEN 250 MG PO TABS: 500.0000 mg | ORAL_TABLET | Freq: Two times a day (BID) | ORAL | 0 refills | Status: AC

## 2019-03-09 NOTE — Assessment & Plan Note (Signed)
BP elevated above goal.  Possibly due to pain.  Repeat check blood pressure in 1 month.  Follow-up BMP to assess creatinine.

## 2019-03-09 NOTE — Assessment & Plan Note (Signed)
Osteoarthritis of the knees.  Unable to continue to prescribe opioids.  Will refer to pain management.  Trial of naproxen instead of ibuprofen.  Plan to check renal function as it has been over a year since his last check.

## 2019-03-09 NOTE — Patient Instructions (Addendum)
It was a pleasure to see you today! Thank you for choosing [facility] for your primary care. Joel Donaldson was seen for knee pain and high blood pressure.   For your knee pain, we are prescribing you naproxen.  Please stop taking any ibuprofen.   We are referring you to a pain management clinic.  We are also checking your kidney blood levels.  For your high blood pressure, we are just checking your blood pressure in 1 month again.  Best,  Marny Lowenstein, MD, MS FAMILY MEDICINE RESIDENT - PGY3 03/09/2019 3:47 PM

## 2019-03-09 NOTE — Progress Notes (Signed)
    Subjective:  Joel Donaldson is a 79 y.o. male who presents to the [facility] today with a chief complaint of bilateral knee pain due to osteoarthritis.   HPI:  Bilateral osteoarthritis of the knee Patient has severe OA of knees bilaterally.  Causes him significant pain.  Historically pain was controlled on oxycodone 10 mg twice daily. Per PDMP, last fill was 02/02/2019.  Patient also takes OTC ibuprofen 2 to 3 pills a day.  Also uses diclofenac gel.  We have tried multiple modalities for pain including SNRI and joint injections.  Discussed referral to surgery, however patient is resistant.  Patient has had UDS x3 that were negative for opiate, one was positive for ethanol.  This violates clinic policy.  Given this, unfortunately I am unable to prescribe the patient's opioids.  Did discuss this with patient.  Recommend referral to pain clinic for further pain management.  Also discussed trialing naproxen instead of ibuprofen for knee pain.  Patient is amenable to this.  Medication refill Requesting refill on COPD inhaler.  Hypertension Patient blood pressure is above goal today at 160/90.  This is elevated her patient's baseline with systolics in the [id number].  Patient takes lisinopril 40 daily.   ROS: Per HPI  PMH: Smoking history reviewed.    Objective:  Physical Exam: BP (!) 160/90   Pulse (!) 102   Wt 178 lb 3.2 oz (80.8 kg)   SpO2 99%   BMI 25.57 kg/m   Gen: NAD, resting comfortably CV: RRR with no murmurs appreciated Pulm: NWOB, CTAB  MSK: Bilateral knees without effusion, patient able to ambulate without assistance Skin: warm, dry Neuro: grossly normal, moves all extremities Psych: Normal affect and thought content  No results found for this or any previous visit (from the past 72 hour(s)).   Assessment/Plan:  Osteoarthritis of both knees Osteoarthritis of the knees.  Unable to continue to prescribe opioids.  Will refer to pain management.  Trial of naproxen instead of  ibuprofen.  Plan to check renal function as it has been over a year since his last check.  Essential hypertension BP elevated above goal.  Possibly due to pain.  Repeat check blood pressure in 1 month.  Follow-up BMP to assess creatinine.   Refill patient albuterol as requested.  Lab Orders     Basic Metabolic Panel  Meds ordered this encounter  Medications  . naproxen (NAPROSYN) 250 MG tablet    Sig: Take 2 tablets (500 mg total) by mouth 2 (two) times daily with a meal.    Dispense:  120 tablet    Refill:  0  . albuterol (VENTOLIN HFA) 108 (90 Base) MCG/ACT inhaler    Sig: Inhale 1-2 puffs into the lungs every 4 (four) hours as needed for wheezing or shortness of breath.    Dispense:  8.5 g    Refill:  3      Marny Lowenstein, MD, MS FAMILY MEDICINE RESIDENT - PGY3 03/09/2019 6:18 PM

## 2019-03-10 ENCOUNTER — Encounter: Payer: Self-pay | Admitting: Family Medicine

## 2019-03-10 LAB — BASIC METABOLIC PANEL
BUN/Creatinine Ratio: 7 — ABNORMAL LOW (ref 10–24)
BUN: 6 mg/dL — ABNORMAL LOW (ref 8–27)
CO2: 24 mmol/L (ref 20–29)
Calcium: 9.3 mg/dL (ref 8.6–10.2)
Chloride: 107 mmol/L — ABNORMAL HIGH (ref 96–106)
Creatinine, Ser: 0.91 mg/dL (ref 0.76–1.27)
GFR calc Af Amer: 92 mL/min/{1.73_m2} (ref >59)
GFR calc non Af Amer: 80 mL/min/{1.73_m2} (ref >59)
Glucose: 96 mg/dL (ref 65–99)
Potassium: 4.2 mmol/L (ref 3.5–5.2)
Sodium: 143 mmol/L (ref 134–144)

## 2019-04-09 ENCOUNTER — Ambulatory Visit: Payer: Medicare Other

## 2019-04-23 ENCOUNTER — Other Ambulatory Visit: Payer: Self-pay | Admitting: Family Medicine

## 2019-04-23 DIAGNOSIS — I10 Essential (primary) hypertension: Secondary | ICD-10-CM

## 2019-05-08 DIAGNOSIS — M129 Arthropathy, unspecified: Secondary | ICD-10-CM | POA: Diagnosis not present

## 2019-05-08 DIAGNOSIS — G8929 Other chronic pain: Secondary | ICD-10-CM | POA: Diagnosis not present

## 2019-05-08 DIAGNOSIS — E559 Vitamin D deficiency, unspecified: Secondary | ICD-10-CM | POA: Diagnosis not present

## 2019-05-08 DIAGNOSIS — Z79899 Other long term (current) drug therapy: Secondary | ICD-10-CM | POA: Diagnosis not present

## 2019-05-08 DIAGNOSIS — M25562 Pain in left knee: Secondary | ICD-10-CM | POA: Diagnosis not present

## 2019-05-08 DIAGNOSIS — M25561 Pain in right knee: Secondary | ICD-10-CM | POA: Diagnosis not present

## 2019-05-22 DIAGNOSIS — M25561 Pain in right knee: Secondary | ICD-10-CM | POA: Diagnosis not present

## 2019-05-22 DIAGNOSIS — E559 Vitamin D deficiency, unspecified: Secondary | ICD-10-CM | POA: Diagnosis not present

## 2019-05-22 DIAGNOSIS — M25562 Pain in left knee: Secondary | ICD-10-CM | POA: Diagnosis not present

## 2019-05-22 DIAGNOSIS — G8929 Other chronic pain: Secondary | ICD-10-CM | POA: Diagnosis not present

## 2019-05-22 DIAGNOSIS — Z79899 Other long term (current) drug therapy: Secondary | ICD-10-CM | POA: Diagnosis not present

## 2019-06-08 DIAGNOSIS — H04123 Dry eye syndrome of bilateral lacrimal glands: Secondary | ICD-10-CM | POA: Diagnosis not present

## 2019-06-08 DIAGNOSIS — H2589 Other age-related cataract: Secondary | ICD-10-CM | POA: Diagnosis not present

## 2019-06-08 DIAGNOSIS — H10413 Chronic giant papillary conjunctivitis, bilateral: Secondary | ICD-10-CM | POA: Diagnosis not present

## 2019-06-08 DIAGNOSIS — H25812 Combined forms of age-related cataract, left eye: Secondary | ICD-10-CM | POA: Diagnosis not present

## 2019-06-08 DIAGNOSIS — H31091 Other chorioretinal scars, right eye: Secondary | ICD-10-CM | POA: Diagnosis not present

## 2019-06-19 DIAGNOSIS — M25512 Pain in left shoulder: Secondary | ICD-10-CM | POA: Diagnosis not present

## 2019-06-19 DIAGNOSIS — M25561 Pain in right knee: Secondary | ICD-10-CM | POA: Diagnosis not present

## 2019-06-19 DIAGNOSIS — Z79899 Other long term (current) drug therapy: Secondary | ICD-10-CM | POA: Diagnosis not present

## 2019-06-19 DIAGNOSIS — M25562 Pain in left knee: Secondary | ICD-10-CM | POA: Diagnosis not present

## 2019-06-19 DIAGNOSIS — G8929 Other chronic pain: Secondary | ICD-10-CM | POA: Diagnosis not present

## 2019-06-22 ENCOUNTER — Other Ambulatory Visit: Payer: Self-pay | Admitting: Family Medicine

## 2019-07-20 DIAGNOSIS — M25512 Pain in left shoulder: Secondary | ICD-10-CM | POA: Diagnosis not present

## 2019-07-20 DIAGNOSIS — G8929 Other chronic pain: Secondary | ICD-10-CM | POA: Diagnosis not present

## 2019-07-20 DIAGNOSIS — M25561 Pain in right knee: Secondary | ICD-10-CM | POA: Diagnosis not present

## 2019-07-20 DIAGNOSIS — M810 Age-related osteoporosis without current pathological fracture: Secondary | ICD-10-CM | POA: Diagnosis not present

## 2019-07-20 DIAGNOSIS — Z79899 Other long term (current) drug therapy: Secondary | ICD-10-CM | POA: Diagnosis not present

## 2019-08-18 DIAGNOSIS — M25511 Pain in right shoulder: Secondary | ICD-10-CM | POA: Diagnosis not present

## 2019-08-18 DIAGNOSIS — G8929 Other chronic pain: Secondary | ICD-10-CM | POA: Diagnosis not present

## 2019-08-18 DIAGNOSIS — M545 Low back pain: Secondary | ICD-10-CM | POA: Diagnosis not present

## 2019-08-18 DIAGNOSIS — M25512 Pain in left shoulder: Secondary | ICD-10-CM | POA: Diagnosis not present

## 2019-08-18 DIAGNOSIS — Z79899 Other long term (current) drug therapy: Secondary | ICD-10-CM | POA: Diagnosis not present

## 2019-08-25 ENCOUNTER — Ambulatory Visit: Payer: Medicare Other

## 2019-09-23 DIAGNOSIS — G8929 Other chronic pain: Secondary | ICD-10-CM | POA: Diagnosis not present

## 2019-09-23 DIAGNOSIS — M25511 Pain in right shoulder: Secondary | ICD-10-CM | POA: Diagnosis not present

## 2019-09-23 DIAGNOSIS — E559 Vitamin D deficiency, unspecified: Secondary | ICD-10-CM | POA: Diagnosis not present

## 2019-09-23 DIAGNOSIS — M25512 Pain in left shoulder: Secondary | ICD-10-CM | POA: Diagnosis not present

## 2019-09-23 DIAGNOSIS — M545 Low back pain: Secondary | ICD-10-CM | POA: Diagnosis not present

## 2019-09-23 DIAGNOSIS — Z79899 Other long term (current) drug therapy: Secondary | ICD-10-CM | POA: Diagnosis not present

## 2019-10-22 ENCOUNTER — Ambulatory Visit (INDEPENDENT_AMBULATORY_CARE_PROVIDER_SITE_OTHER): Payer: Medicare Other | Admitting: Family Medicine

## 2019-10-22 ENCOUNTER — Encounter: Payer: Self-pay | Admitting: Family Medicine

## 2019-10-22 ENCOUNTER — Other Ambulatory Visit: Payer: Self-pay

## 2019-10-22 VITALS — BP 132/74 | HR 89 | Ht 70.0 in | Wt 170.2 lb

## 2019-10-22 DIAGNOSIS — M542 Cervicalgia: Secondary | ICD-10-CM

## 2019-10-22 DIAGNOSIS — M170 Bilateral primary osteoarthritis of knee: Secondary | ICD-10-CM

## 2019-10-22 DIAGNOSIS — Z23 Encounter for immunization: Secondary | ICD-10-CM

## 2019-10-22 DIAGNOSIS — G8929 Other chronic pain: Secondary | ICD-10-CM | POA: Diagnosis not present

## 2019-10-22 DIAGNOSIS — S139XXA Sprain of joints and ligaments of unspecified parts of neck, initial encounter: Secondary | ICD-10-CM

## 2019-10-22 DIAGNOSIS — M25562 Pain in left knee: Secondary | ICD-10-CM | POA: Diagnosis not present

## 2019-10-22 DIAGNOSIS — J449 Chronic obstructive pulmonary disease, unspecified: Secondary | ICD-10-CM | POA: Diagnosis not present

## 2019-10-22 HISTORY — DX: Cervicalgia: M54.2

## 2019-10-22 MED ORDER — METHYLPREDNISOLONE ACETATE 40 MG/ML IJ SUSP
40.0000 mg | Freq: Once | INTRAMUSCULAR | Status: AC
  Administered 2019-10-22: 40 mg via INTRA_ARTICULAR

## 2019-10-22 MED ORDER — ALBUTEROL SULFATE HFA 108 (90 BASE) MCG/ACT IN AERS: 1.0000 | INHALATION_SPRAY | RESPIRATORY_TRACT | 3 refills | Status: DC | PRN

## 2019-10-22 MED ORDER — LIDOCAINE 5 % EX PTCH: 1.0000 | MEDICATED_PATCH | CUTANEOUS | 0 refills | Status: DC
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
## 2019-10-22 NOTE — Assessment & Plan Note (Signed)
Chronic. Patient with known left knee advanced tricompartmental degenerative changes. Joint aspiration today with ~30 mL of fluid removed.  - Follow with pain clinic continue oxycodone 10-325 mg every 12 hours as needed pain, diclofenac gel.  -Follow up for corticosteroid injection if pain not improving.   Joint Aspiration Procedure: After informed written consent was obtained, patient rested on exam table. A towel was placed under the knee. Left knee was prepped with alcohol x3 followed by betadine x 1. Sterile technique use. Utilizing anterolateral approach, patient's left knee intraarticular aspiration with removal of ~30cc of fluid. Patient tolerated the procedure well without immediate complications.
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
## 2019-10-22 NOTE — Patient Instructions (Addendum)
It was great seeing you today!   I'd like to see you back if the pain in your knee does not improve but if you need to be seen earlier than that for any new issues we're happy to fit you in, just give [place] a call!   - For pain take Tylenol 2 (500 mg) tablets up to two-three times a day.  I also sent a Rx for lidocaine pads to your pharmacy. Place pad on left side of neck for 12 hours then removal.  - IF your knee pain does not improve please come back for steroid injection   If you have questions or concerns please do not hesitate to call at [phone].  Dr. Rushie [facility]

## 2019-10-23 ENCOUNTER — Other Ambulatory Visit: Payer: Self-pay

## 2019-10-23 DIAGNOSIS — M545 Low back pain: Secondary | ICD-10-CM | POA: Diagnosis not present

## 2019-10-23 DIAGNOSIS — M546 Pain in thoracic spine: Secondary | ICD-10-CM | POA: Diagnosis not present

## 2019-10-23 DIAGNOSIS — Z79899 Other long term (current) drug therapy: Secondary | ICD-10-CM | POA: Diagnosis not present

## 2019-10-23 DIAGNOSIS — M25512 Pain in left shoulder: Secondary | ICD-10-CM | POA: Diagnosis not present

## 2019-10-23 DIAGNOSIS — G8929 Other chronic pain: Secondary | ICD-10-CM | POA: Diagnosis not present

## 2019-10-23 DIAGNOSIS — M542 Cervicalgia: Secondary | ICD-10-CM

## 2019-10-23 DIAGNOSIS — M25511 Pain in right shoulder: Secondary | ICD-10-CM | POA: Diagnosis not present

## 2019-10-23 MED ORDER — LIDOCAINE 5 % EX PTCH: 1.0000 | MEDICATED_PATCH | CUTANEOUS | 0 refills | Status: DC

## 2019-11-20 DIAGNOSIS — M25511 Pain in right shoulder: Secondary | ICD-10-CM | POA: Diagnosis not present

## 2019-11-20 DIAGNOSIS — G8929 Other chronic pain: Secondary | ICD-10-CM | POA: Diagnosis not present

## 2019-11-20 DIAGNOSIS — M545 Low back pain, unspecified: Secondary | ICD-10-CM | POA: Diagnosis not present

## 2019-11-20 DIAGNOSIS — M546 Pain in thoracic spine: Secondary | ICD-10-CM | POA: Diagnosis not present

## 2019-11-20 DIAGNOSIS — Z79899 Other long term (current) drug therapy: Secondary | ICD-10-CM | POA: Diagnosis not present

## 2019-12-07 ENCOUNTER — Encounter (HOSPITAL_COMMUNITY): Payer: Self-pay | Admitting: Emergency Medicine

## 2019-12-07 ENCOUNTER — Other Ambulatory Visit: Payer: Self-pay

## 2019-12-07 ENCOUNTER — Emergency Department (HOSPITAL_COMMUNITY): Payer: Medicare Other

## 2019-12-07 ENCOUNTER — Emergency Department (HOSPITAL_COMMUNITY)
Admission: EM | Admit: 2019-12-07 | Discharge: 2019-12-08 | Disposition: A | Discharge status: Home / Self Care | Payer: Medicare Other | Attending: Emergency Medicine | Admitting: Emergency Medicine

## 2019-12-07 DIAGNOSIS — Z87891 Personal history of nicotine dependence: Secondary | ICD-10-CM | POA: Insufficient documentation

## 2019-12-07 DIAGNOSIS — Z7982 Long term (current) use of aspirin: Secondary | ICD-10-CM | POA: Diagnosis not present

## 2019-12-07 DIAGNOSIS — R0600 Dyspnea, unspecified: Secondary | ICD-10-CM | POA: Diagnosis not present

## 2019-12-07 DIAGNOSIS — Z79899 Other long term (current) drug therapy: Secondary | ICD-10-CM | POA: Insufficient documentation

## 2019-12-07 DIAGNOSIS — Z8546 Personal history of malignant neoplasm of prostate: Secondary | ICD-10-CM | POA: Diagnosis not present

## 2019-12-07 DIAGNOSIS — Z923 Personal history of irradiation: Secondary | ICD-10-CM | POA: Insufficient documentation

## 2019-12-07 DIAGNOSIS — R0609 Other forms of dyspnea: Secondary | ICD-10-CM

## 2019-12-07 DIAGNOSIS — J45909 Unspecified asthma, uncomplicated: Secondary | ICD-10-CM | POA: Diagnosis not present

## 2019-12-07 DIAGNOSIS — J449 Chronic obstructive pulmonary disease, unspecified: Secondary | ICD-10-CM

## 2019-12-07 DIAGNOSIS — R0602 Shortness of breath: Secondary | ICD-10-CM | POA: Diagnosis not present

## 2019-12-07 LAB — CBC WITH DIFFERENTIAL/PLATELET
Abs Immature Granulocytes: 0.02 10*3/uL (ref 0.00–0.07)
Basophils Absolute: 0 10*3/uL (ref 0.0–0.1)
Basophils Relative: 0 %
Eosinophils Absolute: 0 10*3/uL (ref 0.0–0.5)
Eosinophils Relative: 0 %
HCT: 40.9 % (ref 39.0–52.0)
Hemoglobin: 13 g/dL (ref 13.0–17.0)
Immature Granulocytes: 0 %
Lymphocytes Relative: 12 %
Lymphs Abs: 0.6 10*3/uL — ABNORMAL LOW (ref 0.7–4.0)
MCH: 30.4 pg (ref 26.0–34.0)
MCHC: 31.8 g/dL (ref 30.0–36.0)
MCV: 95.8 fL (ref 80.0–100.0)
Monocytes Absolute: 0.7 10*3/uL (ref 0.1–1.0)
Monocytes Relative: 14 %
Neutro Abs: 3.6 10*3/uL (ref 1.7–7.7)
Neutrophils Relative %: 74 %
Platelets: 237 10*3/uL (ref 150–400)
RBC: 4.27 MIL/uL (ref 4.22–5.81)
RDW: 14.9 % (ref 11.5–15.5)
WBC: 4.9 10*3/uL (ref 4.0–10.5)
nRBC: 0 % (ref 0.0–0.2)

## 2019-12-07 LAB — COMPREHENSIVE METABOLIC PANEL
ALT: 19 U/L (ref 0–44)
AST: 31 U/L (ref 15–41)
Albumin: 3.5 g/dL (ref 3.5–5.0)
Alkaline Phosphatase: 58 U/L (ref 38–126)
Anion gap: 19 — ABNORMAL HIGH (ref 5–15)
BUN: 7 mg/dL — ABNORMAL LOW (ref 8–23)
CO2: 16 mmol/L — ABNORMAL LOW (ref 22–32)
Calcium: 8.9 mg/dL (ref 8.9–10.3)
Chloride: 105 mmol/L (ref 98–111)
Creatinine, Ser: 0.83 mg/dL (ref 0.61–1.24)
GFR, Estimated: >60 mL/min (ref >60)
Glucose, Bld: 96 mg/dL (ref 70–99)
Potassium: 4.3 mmol/L (ref 3.5–5.1)
Sodium: 140 mmol/L (ref 135–145)
Total Bilirubin: 0.8 mg/dL (ref 0.3–1.2)
Total Protein: 6.6 g/dL (ref 6.5–8.1)

## 2019-12-07 NOTE — ED Triage Notes (Signed)
Patient reports SOB worse with exertion onset this evening , no cough or fever, denies chest pain .

## 2019-12-08 DIAGNOSIS — J449 Chronic obstructive pulmonary disease, unspecified: Secondary | ICD-10-CM | POA: Diagnosis not present

## 2019-12-08 LAB — I-STAT VENOUS BLOOD GAS, ED
Acid-Base Excess: 4 mmol/L — ABNORMAL HIGH (ref 0.0–2.0)
Acid-Base Excess: 5 mmol/L — ABNORMAL HIGH (ref 0.0–2.0)
Bicarbonate: 27.9 mmol/L (ref 20.0–28.0)
Bicarbonate: 28 mmol/L (ref 20.0–28.0)
Calcium, Ion: 1.09 mmol/L — ABNORMAL LOW (ref 1.15–1.40)
Calcium, Ion: 1.1 mmol/L — ABNORMAL LOW (ref 1.15–1.40)
HCT: 42 % (ref 39.0–52.0)
HCT: 42 % (ref 39.0–52.0)
Hemoglobin: 14.3 g/dL (ref 13.0–17.0)
Hemoglobin: 14.3 g/dL (ref 13.0–17.0)
O2 Saturation: 81 %
O2 Saturation: 74 %
Potassium: 4.7 mmol/L (ref 3.5–5.1)
Potassium: 4.7 mmol/L (ref 3.5–5.1)
Sodium: 140 mmol/L (ref 135–145)
Sodium: 140 mmol/L (ref 135–145)
TCO2: 29 mmol/L (ref 22–32)
TCO2: 29 mmol/L (ref 22–32)
pCO2, Ven: 38 mmHg — ABNORMAL LOW (ref 44.0–60.0)
pCO2, Ven: 36.6 mmHg — ABNORMAL LOW (ref 44.0–60.0)
pH, Ven: 7.473 — ABNORMAL HIGH (ref 7.250–7.430)
pH, Ven: 7.492 — ABNORMAL HIGH (ref 7.250–7.430)
pO2, Ven: 42 mmHg (ref 32.0–45.0)
pO2, Ven: 36 mmHg (ref 32.0–45.0)

## 2019-12-08 LAB — BASIC METABOLIC PANEL
Anion gap: 8 (ref 5–15)
BUN: 7 mg/dL — ABNORMAL LOW (ref 8–23)
CO2: 24 mmol/L (ref 22–32)
Calcium: 8.8 mg/dL — ABNORMAL LOW (ref 8.9–10.3)
Chloride: 105 mmol/L (ref 98–111)
Creatinine, Ser: 0.73 mg/dL (ref 0.61–1.24)
GFR, Estimated: >60 mL/min (ref >60)
Glucose, Bld: 101 mg/dL — ABNORMAL HIGH (ref 70–99)
Potassium: 5.5 mmol/L — ABNORMAL HIGH (ref 3.5–5.1)
Sodium: 137 mmol/L (ref 135–145)

## 2019-12-08 LAB — TROPONIN I (HIGH SENSITIVITY): Troponin I (High Sensitivity): 14 ng/L (ref <18)

## 2019-12-08 MED ORDER — ALBUTEROL SULFATE HFA 108 (90 BASE) MCG/ACT IN AERS
2.0000 | INHALATION_SPRAY | Freq: Once | RESPIRATORY_TRACT | Status: AC
  Administered 2019-12-08: 2 via RESPIRATORY_TRACT
  Filled 2019-12-08: qty 6.7

## 2019-12-08 MED ORDER — SODIUM CHLORIDE 0.9 % IV BOLUS
500.0000 mL | Freq: Once | INTRAVENOUS | Status: AC
  Administered 2019-12-08: 500 mL via INTRAVENOUS

## 2019-12-08 NOTE — ED Provider Notes (Signed)
[facility] EMERGENCY DEPARTMENT Provider Note  CSN: [id number] Arrival date & time: 12/07/19 1818  Chief Complaint(s) Shortness of Breath  HPI Joel Donaldson is a 79 y.o. male   The history is provided by the patient.  Shortness of Breath Severity:  Mild Onset quality:  Gradual Duration:  1 day Timing:  Constant Progression:  Waxing and waning Chronicity:  Recurrent Context: activity   Relieved by:  Rest Worsened by:  Exertion Associated symptoms: no abdominal pain, no chest pain, no cough, no diaphoresis, no fever, no headaches, no hemoptysis, no sputum production, no vomiting and no wheezing   Risk factors: no hx of PE/DVT, no prolonged immobilization and no tobacco use     Past Medical History Past Medical History:  Diagnosis Date  . Alcohol abuse   . Anxiety   . Arthritis    hands, back, knees  . Asthma   . Depression   . GERD (gastroesophageal reflux disease)   . Glaucoma   . Heart murmur asymptomatic  . Hx of radioisotope therapy 07/26/11   Radiosactive Prostate Seed Implant/ I-125 Seeds  . Hypertension   . Prostate cancer ([place]) dx'd 2016 or 2017   seed implants  . Rheumatoid arthritis ([place]) 10/10/2012   Patient Active Problem List   Diagnosis Date Noted  . Neck pain 10/22/2019  . Opioid dependence ([place]) 11/29/2017  . Essential hypertension   . Baker's cyst of knee, right 06/21/2016  . Right leg swelling 06/14/2016  . Eustachian tube dysfunction 11/17/2015  . Back pain 09/19/2015  . Unspecified vitamin D deficiency 07/07/2013  . Rheumatoid arthritis ([place]) 10/10/2012  . Osteoarthritis of both shoulders 02/15/2012  . Tendinopathy of rotator cuff 01/20/2012  . Preventative health care 12/03/2011  . Osteoarthritis of both knees 07/02/2011  . Alcohol use 05/01/2010  . INTENTION TREMOR 09/09/2008  . Prostate cancer ([place]) 07/01/2008  . HYPERLIPIDEMIA 06/24/2007  . COPD (chronic obstructive pulmonary disease) ([place]) 05/12/2007  .  GASTROESOPHAGEAL REFLUX, NO ESOPHAGITIS 04/11/2006   Home Medication(s) Prior to Admission medications   Medication Sig Start Date End Date Taking? Authorizing Provider  albuterol (VENTOLIN HFA) 108 (90 Base) MCG/ACT inhaler Inhale 1-2 puffs into the lungs every 4 (four) hours as needed for wheezing or shortness of breath. 10/22/19   [name], DO  aspirin EC 81 MG tablet Take 81 mg by mouth every morning.     [provider]  diclofenac sodium (VOLTAREN) 1 % GEL APPLY 2 GRAMS EXTERNALLY TO THE AFFECTED AREA FOUR TIMES DAILY AS NEEDED FOR PAIN 05/02/18   Bonnita Hollow, MD  fluticasone [facility]) 50 MCG/ACT nasal spray SHAKE LIQUID AND USE 2 SPRAYS IN [facility] NOSTRIL DAILY 11/19/18   Bonnita Hollow, MD  lidocaine (LIDODERM) 5 % Place 1 patch onto the skin daily. Remove & Discard patch within 12 hours or as directed by MD 10/23/19   [name], DO  lisinopril (ZESTRIL) 20 MG tablet TAKE 1 TABLET(20 MG) BY MOUTH DAILY 04/23/19   Bonnita Hollow, MD  nitroGLYCERIN (NITROSTAT) 0.4 MG SL tablet Place 0.4 mg under the tongue every 5 (five) minutes as needed for chest pain (chest pain).  12/20/11   Street, Sharon Mt, MD  omeprazole (PRILOSEC) 40 MG capsule TAKE 1 CAPSULE(40 MG) BY MOUTH EVERY MORNING 06/23/19   Bonnita Hollow, MD  oxyCODONE-acetaminophen (PERCOCET) 10-325 MG tablet Take 1 tablet by mouth every 12 (twelve) hours as needed for pain. 11/24/18   Bonnita Hollow, MD  oxyCODONE-acetaminophen (PERCOCET)  10-325 MG tablet Take 1 tablet by mouth every 12 (twelve) hours as needed for pain. 11/24/18   Bonnita Hollow, MD  oxyCODONE-acetaminophen (PERCOCET) 10-325 MG tablet Take 1 tablet by mouth every 12 (twelve) hours as needed for pain. 11/24/18   Bonnita Hollow, MD  polyethylene glycol [facility] Floria Raveling) packet Take 17 g by mouth daily as needed for mild constipation. 10/05/16   Bonnita Hollow, MD                                                                                                                                     Past Surgical History Past Surgical History:  Procedure Laterality Date  . CYSTOSCOPY  09/14/2011   Procedure: CYSTOSCOPY FLEXIBLE;  Surgeon: Hanley Ben, MD;  Location[facility];  Service: Urology;  Laterality: N/A;  No seeds seen in the bladder  . LUMBAR FUSION  1995  . RADIOACTIVE SEED IMPLANT  09/14/2011   Procedure: RADIOACTIVE SEED IMPLANT;  Surgeon: Hanley Ben, MD;  Location: [place];  Service: Urology;  Laterality: N/A;  Total number of seeds -    . SHOULDER SURGERY  YRS AGO   LEFT   Family History Family History  Problem Relation Age of Onset  . Diabetes Mother   . Heart disease Father        lived to 40 - had bypass, pacemaker    Social History Social History   Tobacco Use  . Smoking status: Former Smoker    Packs/day: 2.00    Years: 50.00    Pack years: 100.00    Types: Cigarettes    Start date: 02/12/1953    Quit date: 10/13/2004    Years since quitting: 15.1  . Smokeless tobacco: Never Used  . Tobacco comment: Smoked 2 ppd  Substance Use Topics  . Alcohol use: Yes    Alcohol/week: 14.0 standard drinks    Types: 14 Shots of liquor per week    Comment: 1/2 pint of liquor daily  . Drug use: No   Allergies Tramadol and Pollen extract  Review of Systems Review of Systems  Constitutional: Negative for diaphoresis and fever.  Respiratory: Positive for shortness of breath. Negative for cough, hemoptysis, sputum production and wheezing.   Cardiovascular: Negative for chest pain.  Gastrointestinal: Negative for abdominal pain and vomiting.  Neurological: Negative for headaches.   All other systems are reviewed and are negative for acute change except as noted in the HPI  Physical Exam Vital Signs  I have reviewed the triage vital signs BP (!) 146/89 (BP Location: Right Arm)   Pulse 82   Temp 98.9 F (37.2 C) (Oral)   Resp 18   Ht 5\' 10"  (1.778 m)    SpO2 98%   BMI 24.43 kg/m   Physical Exam Vitals reviewed.  Constitutional:      General: He is not in acute distress.  Appearance: He is well-developed. He is not diaphoretic.  HENT:     Head: Normocephalic and atraumatic.     Nose: Nose normal.  Eyes:     General: No scleral icterus.       Right eye: No discharge.        Left eye: No discharge.     Conjunctiva/sclera: Conjunctivae normal.     Pupils: Pupils are equal, round, and reactive to light.  Cardiovascular:     Rate and Rhythm: Normal rate and regular rhythm.     Heart sounds: No murmur heard.  No friction rub. No gallop.   Pulmonary:     Effort: Pulmonary effort is normal. No respiratory distress.     Breath sounds: Normal breath sounds. No stridor. No rales.  Abdominal:     General: There is no distension.     Palpations: Abdomen is soft.     Tenderness: There is no abdominal tenderness.  Musculoskeletal:        General: No tenderness.     Cervical back: Normal range of motion and neck supple.  Skin:    General: Skin is warm and dry.     Findings: No erythema or rash.  Neurological:     Mental Status: He is alert and oriented to person, place, and time.     ED Results and Treatments Labs (all labs ordered are listed, but only abnormal results are displayed) Labs Reviewed  CBC WITH DIFFERENTIAL/PLATELET - Abnormal; Notable for the following components:      Result Value   Lymphs Abs 0.6 (*)    All other components within normal limits  COMPREHENSIVE METABOLIC PANEL - Abnormal; Notable for the following components:   CO2 16 (*)    BUN 7 (*)    Anion gap 19 (*)    All other components within normal limits  BASIC METABOLIC PANEL - Abnormal; Notable for the following components:   Potassium 5.5 (*)    Glucose, Bld 101 (*)    BUN 7 (*)    Calcium 8.8 (*)    All other components within normal limits  I-STAT VENOUS BLOOD GAS, ED - Abnormal; Notable for the following components:   pH, Ven 7.473 (*)     pCO2, Ven 38.0 (*)    Acid-Base Excess 4.0 (*)    Calcium, Ion 1.09 (*)    All other components within normal limits  I-STAT VENOUS BLOOD GAS, ED - Abnormal; Notable for the following components:   pH, Ven 7.492 (*)    pCO2, Ven 36.6 (*)    Acid-Base Excess 5.0 (*)    Calcium, Ion 1.10 (*)    All other components within normal limits  TROPONIN I (HIGH SENSITIVITY)                                                                                                                         EKG  EKG Interpretation  Date/Time:  Tuesday December 08 2019 05:01:08 EDT Ventricular  Rate:  89 PR Interval:  184 QRS Duration: 87 QT Interval:  380 QTC Calculation: 463 R Axis:   -22 Text Interpretation: Sinus rhythm Borderline left axis deviation artifact resolved. Confirmed by Addison Lank [phone]) on 12/08/2019 5:04:50 AM Also confirmed by Addison Lank [phone]), editor Hattie Perch [phone])  on 12/08/2019 9:48:11 AM      Radiology DG Chest 2 View  Result Date: 12/07/2019 CLINICAL DATA:  Shortness of breath x1 day. EXAM: CHEST - 2 VIEW COMPARISON:  October 04, 2016 FINDINGS: The heart size and mediastinal contours are within normal limits. Both lungs are clear. A chronic fourth left rib fracture is seen. IMPRESSION: No active cardiopulmonary disease. Electronically Signed   By: Virgina Norfolk M.D.   On: 12/07/2019 20:46    Pertinent labs & imaging results that were available during my care of the patient were reviewed by me and considered in my medical decision making (see chart for details).  Medications Ordered in ED Medications  sodium chloride 0.9 % bolus 500 mL (0 mLs Intravenous Stopped 12/08/19 0649)  albuterol (VENTOLIN HFA) 108 (90 Base) MCG/ACT inhaler 2 puff (2 puffs Inhalation Given 12/08/19 0523)                                                                                                                                    Procedures Procedures  (including critical  care time)  Medical Decision Making / ED Course I have reviewed the nursing notes for this encounter and the patient's prior records (if available in EHR or on provided paperwork).   Joel Donaldson was evaluated in Emergency Department on 12/08/2019 for the symptoms described in the history of present illness. He was evaluated in the context of the global COVID-19 pandemic, which necessitated consideration that the patient might be at risk for infection with the SARS-CoV-2 virus that causes COVID-19. Institutional protocols and algorithms that pertain to the evaluation of patients at risk for COVID-19 are in a state of rapid change based on information released by regulatory bodies including the CDC and federal and state organizations. These policies and algorithms were followed during the patient's care in the ED.  DOE w/o CP. No infectious symptoms. Does not appear to be hypervolemic. Initial EKG with artifact vs flutter. Repeat with NSR, no ischemia or dysrhythmia. Chest x-ray without evidence suggestive of pneumonia, pulmonary edema, or pneumothorax. Triage labs w/o leukocytosis or anemia. CMP with metabolic acidosis.  Patient ambulated down the hall on pulse ox and maintained sats >95%. He reports being "a little" dyspneic while walking. He did not have increased WOB and was still able to speak in full sentences.  Obtained repeat BMP with resolved acidosis - w/o intervention. Trop negative. VBG w/o acidosis and normal CO2.  Noted h/o asthma and COPD, which he was unaware of, but did endorse long history of prior smoking.  Given IVF and albuterol, which he reports resolved his SOB.  I have a low suspicion for PE, HF, ACS at this time.        Final Clinical Impression(s) / ED Diagnoses Final diagnoses:  Dyspnea on exertion  Chronic obstructive pulmonary disease, unspecified COPD type ([place])    The patient appears reasonably screened and/or stabilized for discharge and I doubt  any other medical condition or other [facility] requiring further screening, evaluation, or treatment in the ED at this time prior to discharge. Safe for discharge with strict return precautions.  Disposition: Discharge  Condition: Good  I have discussed the results, Dx and Tx plan with the patient/family who expressed understanding and agree(s) with the plan. Discharge instructions discussed at length. The patient/family was given strict return precautions who verbalized understanding of the instructions. No further questions at time of discharge.    ED Discharge Orders    None      Follow Up: Primary care provider  Schedule an appointment as soon as possible for a visit  If you do not have a primary care physician, contact [facility] at [phone] for referral     This chart was dictated using voice recognition software.  Despite best efforts to proofread,  errors can occur which can change the documentation meaning.   Fatima Blank, MD 12/08/19 [phone]

## 2019-12-14 ENCOUNTER — Ambulatory Visit (INDEPENDENT_AMBULATORY_CARE_PROVIDER_SITE_OTHER): Payer: Medicare Other | Admitting: Family Medicine

## 2019-12-14 ENCOUNTER — Encounter: Payer: Self-pay | Admitting: Family Medicine

## 2019-12-14 ENCOUNTER — Other Ambulatory Visit: Payer: Self-pay

## 2019-12-14 VITALS — BP 148/90 | HR 107 | Wt 162.0 lb

## 2019-12-14 DIAGNOSIS — Z122 Encounter for screening for malignant neoplasm of respiratory organs: Secondary | ICD-10-CM

## 2019-12-14 DIAGNOSIS — R0602 Shortness of breath: Secondary | ICD-10-CM | POA: Diagnosis not present

## 2019-12-14 NOTE — Progress Notes (Signed)
    SUBJECTIVE:   CHIEF COMPLAINT / HPI:   Shortness of breath Joel Donaldson presents to clinic today to follow-up from his ED visit about 1 week ago.  At that time, he noticed shortness of breath that had started the day of his ED visit and loose stools.  Blood work and chest x-ray in the emergency room showed no concerning findings he was ultimately sent home and encouraged to use albuterol as needed for shortness of breath.  Based on the notes, it sounds like the main suspicion was mild to COPD exacerbation versus dehydration.  Today in clinic, he reports that his shortness of breath and loose stools have significantly improved.  He is no longer having any loose stools.  He does still notice some shortness of breath although this is improving.  Typically, he is able to do yard work and walk around his house comfortably.  At present, he is able to walk around his house although walking in his yard would be too strenuous for him.  He specifically denies fevers, changes in his chronic cough, chest pain more recent than his ED visit, nausea, vomiting, diarrhea.  PERTINENT  PMH / PSH: Greater than 50-pack-year smoking history, hypertension, hyperlipidemia.  OBJECTIVE:   BP (!) 148/90   Pulse (!) 107   Wt 162 lb (73.5 kg)   SpO2 100%   BMI 23.24 kg/m    General: Alert and cooperative and appears to be in no acute distress HEENT: Neck non-tender without lymphadenopathy, masses or thyromegaly.  No appreciable JVD while sitting upright. Cardio: Normal S1 and S2, no S3 or S4.  Rhythm is regular although mildly tachycardic on exam. No murmurs or rubs.   Pulm: Clear to auscultation bilaterally, no crackles, wheezing, or diminished breath sounds. Normal respiratory effort Abdomen: . Abdomen soft and non-tender.  Extremities: No peripheral edema. Warm/ well perfused.  Strong radial pulse.  ASSESSMENT/PLAN:   Shortness of breath Overall, he appears to be recovering well from his recent emergency  room visit.  It sounds like the primary suspicion at the time of the visit was COPD exacerbation versus dehydration.  I am primarily suspicious of dehydration.  Appears to be breathing comfortably on room air today without any evidence of wheezing or rales.  He does have risk factors for heart failure including hyperlipidemia and hypertension and a smoking history.  I will evaluate with a BNP although his physical exam is reassuring.  I will also follow-up with a CTA chest for lung cancer screening due to smoking history.  He does not appear to need any additional interventions at today's visit.  I will follow up in a couple of days once his results are back.  If he continues having shortness of breath or worsening symptoms, I will consider starting LAMA therapy for COPD.   Joel Haymaker, MD [place]

## 2019-12-14 NOTE — Patient Instructions (Signed)
Shortness of breath: I am glad to hear that your shortness of breath has been improving.  I am sorry that you are not totally back to normal quite yet.  Today, we are going to get one blood test and we are also going to schedule you for lung cancer screening imaging.  I will call you when I get the results of those tests.  If you are still having shortness of breath in a couple of days, we can start a new medicine for COPD.  Because it sounds like you are improving, it sounds like you might not need an additional medicine at this time.

## 2019-12-15 ENCOUNTER — Other Ambulatory Visit: Payer: Self-pay | Admitting: Family Medicine

## 2019-12-15 DIAGNOSIS — R0609 Other forms of dyspnea: Secondary | ICD-10-CM

## 2019-12-15 DIAGNOSIS — R0600 Dyspnea, unspecified: Secondary | ICD-10-CM

## 2019-12-15 LAB — BRAIN NATRIURETIC PEPTIDE: BNP: 106.9 pg/mL — ABNORMAL HIGH (ref 0.0–100.0)

## 2019-12-16 NOTE — Progress Notes (Signed)
2nd attempt to reach pt regarding app for Echo. No answer. Unable to leave message. Will attempt for a 3rd tim later today. Salvatore Marvel, CMA

## 2019-12-16 NOTE — Progress Notes (Signed)
Attempted to reach pt . No answer. Unable to LVM its not set up. Patients appt is Nov 11th at 9:00am at [facility] for ECHO. Will try later today to reach pt. Salvatore Marvel, CMA

## 2019-12-17 NOTE — Progress Notes (Signed)
Attempted to reach pt for a 3rd time. Unable to LVM due to voicemail not being set up with the [phone] number, and the voicemail is full on the [phone] number.  Patient has appt at [facility] on [facility] Nov 11th at 9:00am for ECHOCARDIOGRAM. Salvatore Marvel, CMA

## 2019-12-22 DIAGNOSIS — Z79899 Other long term (current) drug therapy: Secondary | ICD-10-CM | POA: Diagnosis not present

## 2019-12-22 DIAGNOSIS — M545 Low back pain, unspecified: Secondary | ICD-10-CM | POA: Diagnosis not present

## 2019-12-22 DIAGNOSIS — M25511 Pain in right shoulder: Secondary | ICD-10-CM | POA: Diagnosis not present

## 2019-12-22 DIAGNOSIS — G8929 Other chronic pain: Secondary | ICD-10-CM | POA: Diagnosis not present

## 2019-12-22 DIAGNOSIS — M546 Pain in thoracic spine: Secondary | ICD-10-CM | POA: Diagnosis not present

## 2019-12-24 ENCOUNTER — Other Ambulatory Visit: Payer: Self-pay

## 2019-12-24 ENCOUNTER — Ambulatory Visit (HOSPITAL_COMMUNITY)
Admission: RE | Admit: 2019-12-24 | Discharge: 2019-12-24 | Disposition: A | Discharge status: Home / Self Care | Payer: Medicare Other | Source: Ambulatory Visit | Attending: Family Medicine | Admitting: Family Medicine

## 2019-12-24 ENCOUNTER — Telehealth: Payer: Self-pay | Admitting: *Deleted

## 2019-12-24 DIAGNOSIS — E785 Hyperlipidemia, unspecified: Secondary | ICD-10-CM | POA: Diagnosis not present

## 2019-12-24 DIAGNOSIS — R0600 Dyspnea, unspecified: Secondary | ICD-10-CM

## 2019-12-24 DIAGNOSIS — I119 Hypertensive heart disease without heart failure: Secondary | ICD-10-CM | POA: Diagnosis not present

## 2019-12-24 DIAGNOSIS — I351 Nonrheumatic aortic (valve) insufficiency: Secondary | ICD-10-CM | POA: Diagnosis not present

## 2019-12-24 DIAGNOSIS — R0609 Other forms of dyspnea: Secondary | ICD-10-CM

## 2019-12-24 LAB — ECHOCARDIOGRAM COMPLETE
Area-P 1/2: 6.32 cm2
Calc EF: 60.1 %
S' Lateral: 2.2 cm
Single Plane A2C EF: 65.5 %
Single Plane A4C EF: 62.2 %

## 2019-12-24 NOTE — Progress Notes (Signed)
  Echocardiogram 2D Echocardiogram has been performed.  Joel Donaldson 12/24/2019, 9:59 AM

## 2019-12-24 NOTE — Telephone Encounter (Signed)
Received voicemail from [facility] stating that patient no longer qualifies for the low dose CT scan for lung cancer since he has been a former smoker for more than 15 years and is 79 yo.  He can get a regular CT chest.  Will forward to ordering provider for change.  Mariachristina Holle,CMA

## 2019-12-25 ENCOUNTER — Encounter: Payer: Self-pay | Admitting: Family Medicine

## 2019-12-25 ENCOUNTER — Other Ambulatory Visit: Payer: Self-pay | Admitting: Family Medicine

## 2019-12-25 NOTE — Progress Notes (Unsigned)
T

## 2019-12-25 NOTE — Telephone Encounter (Signed)
The imaging has already been modified.

## 2019-12-28 ENCOUNTER — Other Ambulatory Visit: Payer: Self-pay | Admitting: Family Medicine

## 2019-12-28 ENCOUNTER — Ambulatory Visit
Admission: RE | Admit: 2019-12-28 | Discharge: 2019-12-28 | Disposition: A | Discharge status: Home / Self Care | Payer: Medicare Other | Source: Ambulatory Visit | Attending: Family Medicine | Admitting: Family Medicine

## 2019-12-28 ENCOUNTER — Other Ambulatory Visit: Payer: Self-pay

## 2019-12-28 DIAGNOSIS — J40 Bronchitis, not specified as acute or chronic: Secondary | ICD-10-CM | POA: Diagnosis not present

## 2019-12-28 DIAGNOSIS — J984 Other disorders of lung: Secondary | ICD-10-CM | POA: Diagnosis not present

## 2019-12-28 DIAGNOSIS — Z122 Encounter for screening for malignant neoplasm of respiratory organs: Secondary | ICD-10-CM

## 2019-12-28 DIAGNOSIS — J432 Centrilobular emphysema: Secondary | ICD-10-CM | POA: Diagnosis not present

## 2019-12-28 DIAGNOSIS — I2510 Atherosclerotic heart disease of native coronary artery without angina pectoris: Secondary | ICD-10-CM | POA: Diagnosis not present

## 2019-12-28 DIAGNOSIS — R0602 Shortness of breath: Secondary | ICD-10-CM

## 2020-01-01 ENCOUNTER — Other Ambulatory Visit: Payer: Self-pay

## 2020-01-01 ENCOUNTER — Encounter: Payer: Self-pay | Admitting: Family Medicine

## 2020-01-01 ENCOUNTER — Ambulatory Visit (INDEPENDENT_AMBULATORY_CARE_PROVIDER_SITE_OTHER): Payer: Medicare Other | Admitting: Family Medicine

## 2020-01-01 VITALS — BP 134/82 | HR 89 | Wt 165.0 lb

## 2020-01-01 DIAGNOSIS — R0600 Dyspnea, unspecified: Secondary | ICD-10-CM | POA: Diagnosis not present

## 2020-01-01 DIAGNOSIS — R0609 Other forms of dyspnea: Secondary | ICD-10-CM

## 2020-01-01 DIAGNOSIS — J449 Chronic obstructive pulmonary disease, unspecified: Secondary | ICD-10-CM | POA: Diagnosis not present

## 2020-01-01 MED ORDER — NITROGLYCERIN 0.4 MG SL SUBL: 0.4000 mg | SUBLINGUAL_TABLET | SUBLINGUAL | 1 refills | Status: DC | PRN

## 2020-01-01 MED ORDER — INCRUSE ELLIPTA 62.5 MCG/INH IN AEPB: 1.0000 | INHALATION_SPRAY | Freq: Every day | RESPIRATORY_TRACT | 3 refills | Status: DC

## 2020-01-01 MED ORDER — ALBUTEROL SULFATE HFA 108 (90 BASE) MCG/ACT IN AERS: 1.0000 | INHALATION_SPRAY | RESPIRATORY_TRACT | 3 refills | Status: DC | PRN

## 2020-01-01 NOTE — Patient Instructions (Addendum)
It was great seeing you today!   I'd like to see you back if you become more short of breath or have increased cough but if you need to be seen earlier than that for any new issues we're happy to fit you in, just give [place] a call!  Stop by the pharmacy to pick up your new inhalers.  Use the Incruse (umeclidinium) once daily.  Use the albuterol as needed.     If you have questions or concerns please do not hesitate to call at [phone].  Dr. Rushie [facility]     Chronic Obstructive Pulmonary Disease Chronic obstructive pulmonary disease (COPD) is a long-term (chronic) lung problem. When you have COPD, it is hard for air to get in and out of your lungs. Usually the condition gets worse over time, and your lungs will never return to normal. There are things you can do to keep yourself as healthy as possible.  Your doctor may treat your condition with: ? Medicines. ? Oxygen. ? Lung surgery.  Your doctor may also recommend: ? Rehabilitation. This includes steps to make your body work better. It may involve a team of specialists. ? Quitting smoking, if you smoke. ? Exercise and changes to your diet. ? Comfort measures (palliative care). Follow these instructions at home: Medicines  Take over-the-counter and prescription medicines only as told by your doctor.  Talk to your doctor before taking any cough or allergy medicines. You may need to avoid medicines that cause your lungs to be dry. Lifestyle  If you smoke, stop. Smoking makes the problem worse. If you need help quitting, ask your doctor.  Avoid being around things that make your breathing worse. This may include smoke, chemicals, and fumes.  Stay active, but remember to rest as well.  Learn and use tips on how to relax.  Make sure you get enough sleep. Most adults need at least 7 hours of sleep every night.  Eat healthy foods. Eat smaller meals more often. Rest before meals. Controlled  breathing Learn and use tips on how to control your breathing as told by your doctor. Try:  Breathing in (inhaling) through your nose for 1 second. Then, pucker your lips and breath out (exhale) through your lips for 2 seconds.  Putting one hand on your belly (abdomen). Breathe in slowly through your nose for 1 second. Your hand on your belly should move out. Pucker your lips and breathe out slowly through your lips. Your hand on your belly should move in as you breathe out.  Controlled coughing Learn and use controlled coughing to clear mucus from your lungs. Follow these steps: 1. Lean your head a little forward. 2. Breathe in deeply. 3. Try to hold your breath for 3 seconds. 4. Keep your mouth slightly open while coughing 2 times. 5. Spit any mucus out into a tissue. 6. Rest and do the steps again 1 or 2 times as needed. General instructions  Make sure you get all the shots (vaccines) that your doctor recommends. Ask your doctor about a flu shot and a pneumonia shot.  Use oxygen therapy and pulmonary rehabilitation if told by your doctor. If you need home oxygen therapy, ask your doctor if you should buy a tool to measure your oxygen level (oximeter).  Make a COPD action plan with your doctor. This helps you to know what to do if you feel worse than usual.  Manage any other conditions you have as told by your  doctor.  Avoid going outside when it is very hot, cold, or humid.  Avoid people who have a sickness you can catch (contagious).  Keep all follow-up visits as told by your doctor. This is important. Contact a doctor if:  You cough up more mucus than usual.  There is a change in the color or thickness of the mucus.  It is harder to breathe than usual.  Your breathing is faster than usual.  You have trouble sleeping.  You need to use your medicines more often than usual.  You have trouble doing your normal activities such as getting dressed or walking around the  house. Get help right away if:  You have shortness of breath while resting.  You have shortness of breath that stops you from: ? Being able to talk. ? Doing normal activities.  Your chest hurts for longer than 5 minutes.  Your skin color is more blue than usual.  Your pulse oximeter shows that you have low oxygen for longer than 5 minutes.  You have a fever.  You feel too tired to breathe normally. Summary  Chronic obstructive pulmonary disease (COPD) is a long-term lung problem.  The way your lungs work will never return to normal. Usually the condition gets worse over time. There are things you can do to keep yourself as healthy as possible.  Take over-the-counter and prescription medicines only as told by your doctor.  If you smoke, stop. Smoking makes the problem worse. This information is not intended to replace advice given to you by your health care provider. Make sure you discuss any questions you have with your health care provider. Document Revised: 01/11/2017 Document Reviewed: 03/05/2016 Elsevier Patient Education  2020 [employer].

## 2020-01-01 NOTE — Progress Notes (Signed)
   SUBJECTIVE:   CHIEF COMPLAINT / HPI:   Chief Complaint  Patient presents with  . Medication Management     Joel Donaldson is a 79 y.o. male here for follow up.  Shortness of Breath Patient went the ED at the end of October and had negative work up for shortness of breath. Pt was discharged with albuterol prn. Patient with hx of smoking (~50 pk years) but quit about 12 years ago. He continues to be short of breath.  He had a LD CT Chest and ECHO that he would like to discuss the results. Denies increased productive cough, lower extremity swelling, fevers, recent sick contacts, chest pain.         PERTINENT  PMH / PSH: reviewed and updated as appropriate   OBJECTIVE:   BP 134/82   Pulse 89   Wt 165 lb (74.8 kg)   SpO2 96%   BMI 23.68 kg/m    GEN: pleasant elderly male, in no acute distress  CV: regular rate and rhythm. No JVP RESP: no increased work of breathing, clear to ascultation bilaterally with no crackles, wheezes, or rhonchi MSK: no LE edema, or calf tenderness  SKIN: warm, dry    ASSESSMENT/PLAN:   COPD (chronic obstructive pulmonary disease) (HCC) CT chest consistent with COPD.  Patient does have some pulmonary nodules that will need to be monitored.  Patient started on Incruse.  Refilled albuterol.  Strict return precautions given if patient exhibits any symptoms of an acute exacerbation.  Follow-up as needed.  On chart review, patient previously on Spiriva.  If Incruse is not helping consider switching back to Spiriva.     [name], DO PGY-2, [facility] 01/01/2020

## 2020-01-03 NOTE — Assessment & Plan Note (Addendum)
CT chest consistent with COPD.  Patient does have some pulmonary nodules that will need to be monitored.  Patient started on Incruse.  Refilled albuterol.  Strict return precautions given if patient exhibits any symptoms of an acute exacerbation.  Follow-up as needed.  On chart review, patient previously on Spiriva.  If Incruse is not helping consider switching back to Spiriva.

## 2020-01-21 DIAGNOSIS — M545 Low back pain, unspecified: Secondary | ICD-10-CM | POA: Diagnosis not present

## 2020-01-21 DIAGNOSIS — M546 Pain in thoracic spine: Secondary | ICD-10-CM | POA: Diagnosis not present

## 2020-01-21 DIAGNOSIS — E559 Vitamin D deficiency, unspecified: Secondary | ICD-10-CM | POA: Diagnosis not present

## 2020-01-21 DIAGNOSIS — Z79899 Other long term (current) drug therapy: Secondary | ICD-10-CM | POA: Diagnosis not present

## 2020-01-21 DIAGNOSIS — G8929 Other chronic pain: Secondary | ICD-10-CM | POA: Diagnosis not present

## 2020-02-18 DIAGNOSIS — M25511 Pain in right shoulder: Secondary | ICD-10-CM | POA: Diagnosis not present

## 2020-02-18 DIAGNOSIS — R892 Abnormal level of other drugs, medicaments and biological substances in specimens from other organs, systems and tissues: Secondary | ICD-10-CM | POA: Diagnosis not present

## 2020-02-18 DIAGNOSIS — G8929 Other chronic pain: Secondary | ICD-10-CM | POA: Diagnosis not present

## 2020-02-18 DIAGNOSIS — Z79899 Other long term (current) drug therapy: Secondary | ICD-10-CM | POA: Diagnosis not present

## 2020-02-18 DIAGNOSIS — M546 Pain in thoracic spine: Secondary | ICD-10-CM | POA: Diagnosis not present

## 2020-02-18 DIAGNOSIS — I10 Essential (primary) hypertension: Secondary | ICD-10-CM | POA: Diagnosis not present

## 2020-02-18 DIAGNOSIS — M545 Low back pain, unspecified: Secondary | ICD-10-CM | POA: Diagnosis not present

## 2020-02-24 ENCOUNTER — Institutional Professional Consult (permissible substitution): Payer: Medicare Other | Admitting: Emergency Medicine

## 2020-03-15 DIAGNOSIS — G8929 Other chronic pain: Secondary | ICD-10-CM | POA: Diagnosis not present

## 2020-03-15 DIAGNOSIS — M545 Low back pain, unspecified: Secondary | ICD-10-CM | POA: Diagnosis not present

## 2020-03-15 DIAGNOSIS — Z79899 Other long term (current) drug therapy: Secondary | ICD-10-CM | POA: Diagnosis not present

## 2020-03-15 DIAGNOSIS — Z6824 Body mass index (BMI) 24.0-24.9, adult: Secondary | ICD-10-CM | POA: Diagnosis not present

## 2020-03-15 DIAGNOSIS — M546 Pain in thoracic spine: Secondary | ICD-10-CM | POA: Diagnosis not present

## 2020-03-15 DIAGNOSIS — G894 Chronic pain syndrome: Secondary | ICD-10-CM | POA: Diagnosis not present

## 2020-04-11 ENCOUNTER — Other Ambulatory Visit: Payer: Self-pay

## 2020-04-11 DIAGNOSIS — I10 Essential (primary) hypertension: Secondary | ICD-10-CM

## 2020-04-11 MED ORDER — LISINOPRIL 20 MG PO TABS: ORAL_TABLET | ORAL | 0 refills | Status: DC

## 2020-04-11 NOTE — Telephone Encounter (Signed)
Attempted to reach pt to make appt to follow up on HTN. No answer. Unable to LVM due to no set up. Will try later. Salvatore Marvel, CMA

## 2020-04-11 NOTE — Telephone Encounter (Signed)
Pt needs appointment for HTN follow up.

## 2020-04-12 DIAGNOSIS — M546 Pain in thoracic spine: Secondary | ICD-10-CM | POA: Diagnosis not present

## 2020-04-12 DIAGNOSIS — G894 Chronic pain syndrome: Secondary | ICD-10-CM | POA: Diagnosis not present

## 2020-04-12 DIAGNOSIS — G8929 Other chronic pain: Secondary | ICD-10-CM | POA: Diagnosis not present

## 2020-04-12 DIAGNOSIS — Z6824 Body mass index (BMI) 24.0-24.9, adult: Secondary | ICD-10-CM | POA: Diagnosis not present

## 2020-04-12 DIAGNOSIS — Z79899 Other long term (current) drug therapy: Secondary | ICD-10-CM | POA: Diagnosis not present

## 2020-04-12 DIAGNOSIS — M545 Low back pain, unspecified: Secondary | ICD-10-CM | POA: Diagnosis not present

## 2020-04-21 ENCOUNTER — Institutional Professional Consult (permissible substitution): Payer: Medicare Other | Admitting: Pulmonary Disease

## 2020-05-13 DIAGNOSIS — Z6823 Body mass index (BMI) 23.0-23.9, adult: Secondary | ICD-10-CM | POA: Diagnosis not present

## 2020-05-13 DIAGNOSIS — G894 Chronic pain syndrome: Secondary | ICD-10-CM | POA: Diagnosis not present

## 2020-05-13 DIAGNOSIS — M545 Low back pain, unspecified: Secondary | ICD-10-CM | POA: Diagnosis not present

## 2020-05-13 DIAGNOSIS — G8929 Other chronic pain: Secondary | ICD-10-CM | POA: Diagnosis not present

## 2020-05-13 DIAGNOSIS — Z79899 Other long term (current) drug therapy: Secondary | ICD-10-CM | POA: Diagnosis not present

## 2020-05-13 DIAGNOSIS — M546 Pain in thoracic spine: Secondary | ICD-10-CM | POA: Diagnosis not present

## 2020-05-20 ENCOUNTER — Observation Stay (HOSPITAL_COMMUNITY)
Admission: EM | Admit: 2020-05-20 | Discharge: 2020-05-21 | Disposition: A | Discharge status: Home / Self Care | Payer: Medicare Other | Attending: Family Medicine | Admitting: Family Medicine

## 2020-05-20 ENCOUNTER — Emergency Department (HOSPITAL_COMMUNITY): Payer: Medicare Other

## 2020-05-20 ENCOUNTER — Encounter (HOSPITAL_COMMUNITY): Payer: Self-pay | Admitting: Family Medicine

## 2020-05-20 DIAGNOSIS — M5136 Other intervertebral disc degeneration, lumbar region: Secondary | ICD-10-CM | POA: Diagnosis not present

## 2020-05-20 DIAGNOSIS — I10 Essential (primary) hypertension: Secondary | ICD-10-CM | POA: Diagnosis not present

## 2020-05-20 DIAGNOSIS — K219 Gastro-esophageal reflux disease without esophagitis: Secondary | ICD-10-CM | POA: Diagnosis not present

## 2020-05-20 DIAGNOSIS — J45909 Unspecified asthma, uncomplicated: Secondary | ICD-10-CM | POA: Diagnosis not present

## 2020-05-20 DIAGNOSIS — R0789 Other chest pain: Secondary | ICD-10-CM | POA: Diagnosis not present

## 2020-05-20 DIAGNOSIS — R000 Tachycardia, unspecified: Secondary | ICD-10-CM | POA: Diagnosis not present

## 2020-05-20 DIAGNOSIS — I2510 Atherosclerotic heart disease of native coronary artery without angina pectoris: Secondary | ICD-10-CM | POA: Diagnosis not present

## 2020-05-20 DIAGNOSIS — Z7982 Long term (current) use of aspirin: Secondary | ICD-10-CM | POA: Insufficient documentation

## 2020-05-20 DIAGNOSIS — N200 Calculus of kidney: Secondary | ICD-10-CM | POA: Diagnosis not present

## 2020-05-20 DIAGNOSIS — K7689 Other specified diseases of liver: Secondary | ICD-10-CM | POA: Diagnosis not present

## 2020-05-20 DIAGNOSIS — F1090 Alcohol use, unspecified, uncomplicated: Secondary | ICD-10-CM

## 2020-05-20 DIAGNOSIS — Z8546 Personal history of malignant neoplasm of prostate: Secondary | ICD-10-CM | POA: Diagnosis not present

## 2020-05-20 DIAGNOSIS — R0609 Other forms of dyspnea: Secondary | ICD-10-CM

## 2020-05-20 DIAGNOSIS — I208 Other forms of angina pectoris: Secondary | ICD-10-CM

## 2020-05-20 DIAGNOSIS — R079 Chest pain, unspecified: Secondary | ICD-10-CM | POA: Diagnosis not present

## 2020-05-20 DIAGNOSIS — I701 Atherosclerosis of renal artery: Secondary | ICD-10-CM | POA: Diagnosis not present

## 2020-05-20 DIAGNOSIS — J449 Chronic obstructive pulmonary disease, unspecified: Secondary | ICD-10-CM | POA: Insufficient documentation

## 2020-05-20 DIAGNOSIS — R1013 Epigastric pain: Secondary | ICD-10-CM | POA: Insufficient documentation

## 2020-05-20 DIAGNOSIS — Z87891 Personal history of nicotine dependence: Secondary | ICD-10-CM | POA: Diagnosis not present

## 2020-05-20 DIAGNOSIS — I700 Atherosclerosis of aorta: Secondary | ICD-10-CM | POA: Diagnosis present

## 2020-05-20 DIAGNOSIS — M069 Rheumatoid arthritis, unspecified: Secondary | ICD-10-CM | POA: Diagnosis not present

## 2020-05-20 DIAGNOSIS — Z789 Other specified health status: Secondary | ICD-10-CM

## 2020-05-20 DIAGNOSIS — R531 Weakness: Secondary | ICD-10-CM | POA: Diagnosis not present

## 2020-05-20 DIAGNOSIS — I2089 Other forms of angina pectoris: Secondary | ICD-10-CM

## 2020-05-20 DIAGNOSIS — Z79899 Other long term (current) drug therapy: Secondary | ICD-10-CM | POA: Insufficient documentation

## 2020-05-20 DIAGNOSIS — E7800 Pure hypercholesterolemia, unspecified: Secondary | ICD-10-CM

## 2020-05-20 DIAGNOSIS — Z7289 Other problems related to lifestyle: Secondary | ICD-10-CM | POA: Diagnosis not present

## 2020-05-20 DIAGNOSIS — E785 Hyperlipidemia, unspecified: Secondary | ICD-10-CM | POA: Diagnosis present

## 2020-05-20 DIAGNOSIS — I708 Atherosclerosis of other arteries: Secondary | ICD-10-CM | POA: Diagnosis not present

## 2020-05-20 DIAGNOSIS — N281 Cyst of kidney, acquired: Secondary | ICD-10-CM | POA: Diagnosis not present

## 2020-05-20 DIAGNOSIS — R0600 Dyspnea, unspecified: Secondary | ICD-10-CM

## 2020-05-20 HISTORY — DX: Epigastric pain: R10.13

## 2020-05-20 LAB — HEPATIC FUNCTION PANEL
ALT: 32 U/L (ref 0–44)
AST: 60 U/L — ABNORMAL HIGH (ref 15–41)
Albumin: 3.8 g/dL (ref 3.5–5.0)
Alkaline Phosphatase: 62 U/L (ref 38–126)
Bilirubin, Direct: 0.3 mg/dL — ABNORMAL HIGH (ref 0.0–0.2)
Indirect Bilirubin: 0.6 mg/dL (ref 0.3–0.9)
Total Bilirubin: 0.9 mg/dL (ref 0.3–1.2)
Total Protein: 6.6 g/dL (ref 6.5–8.1)

## 2020-05-20 LAB — BASIC METABOLIC PANEL
Anion gap: 11 (ref 5–15)
BUN: 6 mg/dL — ABNORMAL LOW (ref 8–23)
CO2: 27 mmol/L (ref 22–32)
Calcium: 9.1 mg/dL (ref 8.9–10.3)
Chloride: 102 mmol/L (ref 98–111)
Creatinine, Ser: 0.85 mg/dL (ref 0.61–1.24)
GFR, Estimated: >60 mL/min (ref >60)
Glucose, Bld: 119 mg/dL — ABNORMAL HIGH (ref 70–99)
Potassium: 3.1 mmol/L — ABNORMAL LOW (ref 3.5–5.1)
Sodium: 140 mmol/L (ref 135–145)

## 2020-05-20 LAB — CBC
HCT: 38.8 % — ABNORMAL LOW (ref 39.0–52.0)
Hemoglobin: 12.9 g/dL — ABNORMAL LOW (ref 13.0–17.0)
MCH: 31.6 pg (ref 26.0–34.0)
MCHC: 33.2 g/dL (ref 30.0–36.0)
MCV: 95.1 fL (ref 80.0–100.0)
Platelets: 160 10*3/uL (ref 150–400)
RBC: 4.08 MIL/uL — ABNORMAL LOW (ref 4.22–5.81)
RDW: 15.2 % (ref 11.5–15.5)
WBC: 4.4 10*3/uL (ref 4.0–10.5)
nRBC: 0 % (ref 0.0–0.2)

## 2020-05-20 LAB — LIPASE, BLOOD: Lipase: 30 U/L (ref 11–51)

## 2020-05-20 LAB — TROPONIN I (HIGH SENSITIVITY)
Troponin I (High Sensitivity): 13 ng/L (ref <18)
Troponin I (High Sensitivity): 14 ng/L (ref <18)

## 2020-05-20 LAB — D-DIMER, QUANTITATIVE: D-Dimer, Quant: 1.95 ug/mL-FEU — ABNORMAL HIGH (ref 0.00–0.50)

## 2020-05-20 MED ORDER — ENOXAPARIN SODIUM 40 MG/0.4ML ~~LOC~~ SOLN
40.0000 mg | SUBCUTANEOUS | Status: DC
  Administered 2020-05-20 – 2020-05-21 (×2): 40 mg via SUBCUTANEOUS
  Filled 2020-05-20 (×2): qty 0.4

## 2020-05-20 MED ORDER — OXYCODONE HCL 5 MG PO TABS
10.0000 mg | ORAL_TABLET | Freq: Two times a day (BID) | ORAL | Status: DC | PRN
  Administered 2020-05-20: 10 mg via ORAL
  Filled 2020-05-20: qty 2

## 2020-05-20 MED ORDER — ASPIRIN EC 81 MG PO TBEC
81.0000 mg | DELAYED_RELEASE_TABLET | ORAL | Status: DC
  Administered 2020-05-21: 81 mg via ORAL
  Filled 2020-05-20: qty 1

## 2020-05-20 MED ORDER — ACETAMINOPHEN 325 MG PO TABS
650.0000 mg | ORAL_TABLET | Freq: Four times a day (QID) | ORAL | Status: DC | PRN
  Administered 2020-05-20: 650 mg via ORAL
  Filled 2020-05-20: qty 2

## 2020-05-20 MED ORDER — SENNA 8.6 MG PO TABS
1.0000 | ORAL_TABLET | Freq: Every day | ORAL | Status: DC
  Administered 2020-05-21: 8.6 mg via ORAL
  Filled 2020-05-20: qty 1

## 2020-05-20 MED ORDER — LIDOCAINE 5 % EX PTCH: 1.0000 | MEDICATED_PATCH | CUTANEOUS | Status: DC

## 2020-05-20 MED ORDER — UMECLIDINIUM BROMIDE 62.5 MCG/INH IN AEPB
1.0000 | INHALATION_SPRAY | Freq: Every day | RESPIRATORY_TRACT | Status: DC
  Administered 2020-05-21: 1 via RESPIRATORY_TRACT
  Filled 2020-05-20: qty 7

## 2020-05-20 MED ORDER — BUPRENORPHINE 7.5 MCG/HR TD PTWK: 1.0000 | MEDICATED_PATCH | TRANSDERMAL | Status: DC

## 2020-05-20 MED ORDER — LISINOPRIL 10 MG PO TABS
20.0000 mg | ORAL_TABLET | Freq: Every day | ORAL | Status: DC
  Administered 2020-05-21: 20 mg via ORAL
  Filled 2020-05-20: qty 2

## 2020-05-20 MED ORDER — POLYETHYLENE GLYCOL 3350 17 G PO PACK
17.0000 g | PACK | Freq: Every day | ORAL | Status: DC
  Administered 2020-05-21: 17 g via ORAL
  Filled 2020-05-20: qty 1

## 2020-05-20 MED ORDER — ALBUTEROL SULFATE (2.5 MG/3ML) 0.083% IN NEBU: 2.5000 mg | INHALATION_SOLUTION | RESPIRATORY_TRACT | Status: DC | PRN

## 2020-05-20 MED ORDER — DICLOFENAC SODIUM 1 % TD GEL
2.0000 g | Freq: Two times a day (BID) | TRANSDERMAL | Status: DC | PRN
  Filled 2020-05-20: qty 100

## 2020-05-20 MED ORDER — NITROGLYCERIN 0.4 MG SL SUBL
0.4000 mg | SUBLINGUAL_TABLET | SUBLINGUAL | Status: DC | PRN
  Administered 2020-05-20 (×2): 0.4 mg via SUBLINGUAL
  Filled 2020-05-20 (×2): qty 1

## 2020-05-20 MED ORDER — PANTOPRAZOLE SODIUM 40 MG PO TBEC: 80.0000 mg | DELAYED_RELEASE_TABLET | Freq: Every day | ORAL | Status: DC

## 2020-05-20 MED ORDER — ACETAMINOPHEN 650 MG RE SUPP: 650.0000 mg | Freq: Four times a day (QID) | RECTAL | Status: DC | PRN

## 2020-05-20 MED ORDER — IOHEXOL 350 MG/ML SOLN
100.0000 mL | Freq: Once | INTRAVENOUS | Status: AC
  Administered 2020-05-20: 100 mL via INTRAVENOUS

## 2020-05-20 NOTE — ED Triage Notes (Signed)
Pt presents to the ED with left sided chest pain. No radiation or associated symptoms. States it started last night while he was laying in bed. Denies cardiac hx.

## 2020-05-20 NOTE — ED Notes (Signed)
Pt noted to have Buprenorphine transdermal patch to right arm.

## 2020-05-20 NOTE — ED Provider Notes (Signed)
[facility] EMERGENCY DEPARTMENT Provider Note   CSN: [id number] Arrival date & time: 05/20/20  9024     History Chief Complaint  Patient presents with  . Chest Pain    Joel Donaldson is a 80 y.o. male.   Chest Pain Pain location:  L chest Pain quality: not crushing and no pressure   Pain quality comment:  Unable to specify Pain radiates to:  Does not radiate Pain severity:  Moderate Onset quality:  Gradual Timing:  Constant Progression:  Unchanged Context: at rest   Relieved by:  Nothing Worsened by:  Nothing Ineffective treatments:  None tried Associated symptoms: diaphoresis (feels this way)   Associated symptoms: no back pain, no cough, no fever, no headache, no nausea, no palpitations, no shortness of breath and no vomiting   Risk factors: hypertension and male sex   Risk factors: no coronary artery disease, no diabetes mellitus, no high cholesterol and no smoking     HPI: A 80 year old patient with a history of hypertension presents for evaluation of chest pain. Initial onset of pain was more than 6 hours ago. The patient's chest pain is not worse with exertion. The patient reports some diaphoresis. The patient's chest pain is middle- or left-sided, is not well-localized, is not described as heaviness/pressure/tightness, is not sharp and does not radiate to the arms/jaw/neck. The patient does not complain of nausea. The patient has no history of stroke, has no history of peripheral artery disease, has not smoked in the past 90 days, denies any history of treated diabetes, has no relevant family history of coronary artery disease (first degree relative at less than age 32), has no history of hypercholesterolemia and does not have an elevated BMI (>=30).   Past Medical History:  Diagnosis Date  . Alcohol abuse   . Anxiety   . Arthritis    hands, back, knees  . Asthma   . Depression   . GERD (gastroesophageal reflux disease)   . Glaucoma   . Heart  murmur asymptomatic  . Hx of radioisotope therapy 07/26/11   Radiosactive Prostate Seed Implant/ I-125 Seeds  . Hypertension   . Prostate cancer ([place]) dx'd 2016 or 2017   seed implants  . Rheumatoid arthritis ([place]) 10/10/2012    Patient Active Problem List   Diagnosis Date Noted  . Neck pain 10/22/2019  . Opioid dependence ([place]) 11/29/2017  . Essential hypertension   . Baker's cyst of knee, right 06/21/2016  . Right leg swelling 06/14/2016  . Eustachian tube dysfunction 11/17/2015  . Back pain 09/19/2015  . Unspecified vitamin D deficiency 07/07/2013  . Rheumatoid arthritis ([place]) 10/10/2012  . Osteoarthritis of both shoulders 02/15/2012  . Tendinopathy of rotator cuff 01/20/2012  . Preventative health care 12/03/2011  . Osteoarthritis of both knees 07/02/2011  . Alcohol use 05/01/2010  . INTENTION TREMOR 09/09/2008  . Prostate cancer ([place]) 07/01/2008  . HYPERLIPIDEMIA 06/24/2007  . COPD (chronic obstructive pulmonary disease) ([place]) 05/12/2007  . GASTROESOPHAGEAL REFLUX, NO ESOPHAGITIS 04/11/2006    Past Surgical History:  Procedure Laterality Date  . CYSTOSCOPY  09/14/2011   Procedure: CYSTOSCOPY FLEXIBLE;  Surgeon: Hanley Ben, MD;  Location: [facility];  Service: Urology;  Laterality: N/A;  No seeds seen in the bladder  . LUMBAR FUSION  1995  . RADIOACTIVE SEED IMPLANT  09/14/2011   Procedure: RADIOACTIVE SEED IMPLANT;  Surgeon: Hanley Ben, MD;  Location: [place];  Service: Urology;  Laterality: N/A;  Total number of  seeds -    . SHOULDER SURGERY  YRS AGO   LEFT       Family History  Problem Relation Age of Onset  . Diabetes Mother   . Heart disease Father        lived to 48 - had bypass, pacemaker    Social History   Tobacco Use  . Smoking status: Former Smoker    Packs/day: 2.00    Years: 50.00    Pack years: 100.00    Types: Cigarettes    Start date: 02/12/1953    Quit date: 10/13/2004    Years since quitting: 15.6   . Smokeless tobacco: Never Used  . Tobacco comment: Smoked 2 ppd  Substance Use Topics  . Alcohol use: Yes    Alcohol/week: 14.0 standard drinks    Types: 14 Shots of liquor per week    Comment: 1/2 pint of liquor daily  . Drug use: No    Home Medications Prior to Admission medications   Medication Sig Start Date End Date Taking? Authorizing Provider  albuterol (VENTOLIN HFA) 108 (90 Base) MCG/ACT inhaler Inhale 1-2 puffs into the lungs every 4 (four) hours as needed for wheezing or shortness of breath. 01/01/20   [name], DO  aspirin EC 81 MG tablet Take 81 mg by mouth every morning.     [provider]  diclofenac sodium (VOLTAREN) 1 % GEL APPLY 2 GRAMS EXTERNALLY TO THE AFFECTED AREA FOUR TIMES DAILY AS NEEDED FOR PAIN 05/02/18   Bonnita Hollow, MD  fluticasone [facility]) 50 MCG/ACT nasal spray SHAKE LIQUID AND USE 2 SPRAYS IN [facility] NOSTRIL DAILY 11/19/18   Bonnita Hollow, MD  lidocaine (LIDODERM) 5 % Place 1 patch onto the skin daily. Remove & Discard patch within 12 hours or as directed by MD 10/23/19   [name], DO  lisinopril (ZESTRIL) 20 MG tablet TAKE 1 TABLET(20 MG) BY MOUTH DAILY 04/11/20   Brimage, Ronnette Juniper, DO  nitroGLYCERIN (NITROSTAT) 0.4 MG SL tablet Place 1 tablet (0.4 mg total) under the tongue every 5 (five) minutes as needed for chest pain (chest pain). 01/01/20   [name], DO  omeprazole (PRILOSEC) 40 MG capsule TAKE 1 CAPSULE(40 MG) BY MOUTH EVERY MORNING 06/23/19   Bonnita Hollow, MD  oxyCODONE-acetaminophen (PERCOCET) 10-325 MG tablet Take 1 tablet by mouth every 12 (twelve) hours as needed for pain. 11/24/18   Bonnita Hollow, MD  oxyCODONE-acetaminophen (PERCOCET) 10-325 MG tablet Take 1 tablet by mouth every 12 (twelve) hours as needed for pain. 11/24/18   Bonnita Hollow, MD  oxyCODONE-acetaminophen (PERCOCET) 10-325 MG tablet Take 1 tablet by mouth every 12 (twelve) hours as needed for pain. 11/24/18   Bonnita Hollow, MD   polyethylene glycol [facility]) packet Take 17 g by mouth daily as needed for mild constipation. 10/05/16   Bonnita Hollow, MD  umeclidinium bromide (INCRUSE ELLIPTA) 62.5 MCG/INH AEPB Inhale 1 puff into the lungs daily. 01/01/20   [name], DO    Allergies    Tramadol and Pollen extract  Review of Systems   Review of Systems  Constitutional: Positive for diaphoresis (feels this way). Negative for chills and fever.  HENT: Negative for congestion and rhinorrhea.   Respiratory: Negative for cough and shortness of breath.   Cardiovascular: Positive for chest pain. Negative for palpitations.  Gastrointestinal: Negative for diarrhea, nausea and vomiting.  Genitourinary: Negative for difficulty urinating and dysuria.  Musculoskeletal: Negative for arthralgias and back pain.  Skin: Negative for color change and rash.  Neurological: Negative for light-headedness and headaches.    Physical Exam Updated Vital Signs BP (!) 156/98 (BP Location: Left Arm)   Pulse (!) 101   Temp 98.3 F (36.8 C) (Oral)   Resp 16   SpO2 94%   Physical Exam Vitals and nursing note reviewed.  Constitutional:      General: He is not in acute distress.    Appearance: Normal appearance.  HENT:     Head: Normocephalic and atraumatic.     Nose: No rhinorrhea.  Eyes:     General:        Right eye: No discharge.        Left eye: No discharge.     Conjunctiva/sclera: Conjunctivae normal.  Cardiovascular:     Rate and Rhythm: Regular rhythm. Tachycardia present.  Pulmonary:     Effort: Pulmonary effort is normal.     Breath sounds: No stridor.  Abdominal:     General: Abdomen is flat. There is no distension.     Palpations: Abdomen is soft.  Musculoskeletal:        General: No deformity or signs of injury.     Right lower leg: No edema.     Left lower leg: No edema.  Skin:    General: Skin is warm and dry.  Neurological:     General: No focal deficit present.     Mental Status: He  is alert. Mental status is at baseline.     Motor: No weakness.  Psychiatric:        Mood and Affect: Mood normal.        Behavior: Behavior normal.        Thought Content: Thought content normal.     ED Results / Procedures / Treatments   Labs (all labs ordered are listed, but only abnormal results are displayed) Labs Reviewed  BASIC METABOLIC PANEL - Abnormal; Notable for the following components:      Result Value   Potassium 3.1 (*)    Glucose, Bld 119 (*)    BUN 6 (*)    All other components within normal limits  CBC - Abnormal; Notable for the following components:   RBC 4.08 (*)    Hemoglobin 12.9 (*)    HCT 38.8 (*)    All other components within normal limits  D-DIMER, QUANTITATIVE - Abnormal; Notable for the following components:   D-Dimer, Quant 1.95 (*)    All other components within normal limits  HEPATIC FUNCTION PANEL - Abnormal; Notable for the following components:   AST 60 (*)    Bilirubin, Direct 0.3 (*)    All other components within normal limits  LIPASE, BLOOD  TROPONIN I (HIGH SENSITIVITY)  TROPONIN I (HIGH SENSITIVITY)    EKG EKG Interpretation  Date/Time:  Friday May 20 2020 06:46:30 EDT Ventricular Rate:  117 PR Interval:  152 QRS Duration: 76 QT Interval:  336 QTC Calculation: 468 R Axis:   20 Text Interpretation: Sinus tachycardia with Premature atrial complexes with Abberant conduction Cannot rule out Anterior infarct , age undetermined Abnormal ECG Confirmed by Dewaine Conger [phone]) on 05/20/2020 7:20:15 AM   Radiology DG Chest 2 View  Result Date: 05/20/2020 CLINICAL DATA:  Chest pain and weakness since last night EXAM: CHEST - 2 VIEW COMPARISON:  12/07/2019 FINDINGS: Normal heart size and mediastinal contours. No acute infiltrate or edema. No effusion or pneumothorax. No acute osseous findings. IMPRESSION: No active cardiopulmonary disease. Electronically Signed  By: Monte Fantasia M.D.   On: 05/20/2020 07:06   CT Angio Chest/Abd/Pel  for Dissection W and/or Wo Contrast  Result Date: 05/20/2020 CLINICAL DATA:  Chest/back pain, suspect aortic dissection EXAM: CT ANGIOGRAPHY CHEST, ABDOMEN AND PELVIS TECHNIQUE: Non-contrast CT of the chest was initially obtained. Multidetector CT imaging through the chest, abdomen and pelvis was performed using the standard protocol during bolus administration of intravenous contrast. Multiplanar reconstructed images and MIPs were obtained and reviewed to evaluate the vascular anatomy. CONTRAST:  19mL OMNIPAQUE IOHEXOL 350 MG/ML SOLN COMPARISON:  Prior CT chest 12/28/2019; prior CT abdomen/pelvis 03/11/2010 FINDINGS: CTA CHEST FINDINGS Cardiovascular: Initial unenhanced CT imaging of the chest demonstrates no evidence of acute intramural hematoma. Following administration of contrast there is preferential opacification of the aorta. Two vessel aortic arch anatomy. The right brachiocephalic and left common carotid artery share a common origin. The aortic root, ascending and transverse thoracic aorta are all normal in caliber. There is no evidence of dissection. Mildly ectatic but not aneurysmal descending thoracic aorta. Minimal atherosclerotic plaque. Main pulmonary artery is normal in caliber. The heart is normal in size. Calcifications are present along the coronary arteries. No pericardial effusion. Mediastinum/Nodes: Unremarkable CT appearance of the thyroid gland. No suspicious mediastinal or hilar adenopathy. No soft tissue mediastinal mass. The thoracic esophagus is unremarkable. Lungs/Pleura: Lungs are clear. No pleural effusion or pneumothorax. Musculoskeletal: No acute fracture or aggressive appearing lytic or blastic osseous lesion. Review of the MIP images confirms the above findings. CTA ABDOMEN AND PELVIS FINDINGS VASCULAR Aorta: Normal caliber aorta without aneurysm, dissection, vasculitis or significant stenosis. Calcified atherosclerotic plaque. Celiac: Patent without evidence of aneurysm,  dissection, vasculitis or significant stenosis. SMA: Patent without evidence of aneurysm, dissection, vasculitis or significant stenosis. Renals: There are 2 right-sided renal arteries. Heterogeneous atherosclerotic plaque results in mild stenosis of the origin of the main right renal artery. Small accessory artery to the right lower pole. On the left, there is a single main renal artery. Mild atherosclerotic plaque without significant stenosis. No changes of fibromuscular dysplasia. No dissection or aneurysm. IMA: Patent without evidence of aneurysm, dissection, vasculitis or significant stenosis. Inflow: Patent without evidence of aneurysm, dissection, vasculitis or significant stenosis. Veins: No obvious venous abnormality within the limitations of this arterial phase study. Review of the MIP images confirms the above findings. NON-VASCULAR Hepatobiliary: No focal solid liver abnormality is seen. Circumscribed low-attenuation renal lesions appear similar compared to prior imaging from November of 2021 and are almost certainly benign cysts. No gallstones, gallbladder wall thickening, or biliary dilatation. Pancreas: Unremarkable. No pancreatic ductal dilatation or surrounding inflammatory changes. Spleen: Normal in size without focal abnormality. Adrenals/Urinary Tract: Normal adrenal glands. No evidence of hydronephrosis or enhancing renal mass. 2 cm simple cyst in the upper pole of the right kidney. 0.9 cm nonobstructing stone in the interpolar right renal collecting system. No evidence of hydronephrosis. The ureters and bladder are unremarkable. Stomach/Bowel: No evidence of obstruction or focal bowel wall thickening. Normal appendix in the right lower quadrant. The terminal ileum is unremarkable. Redundant sigmoid colon. Lymphatic: No suspicious lymphadenopathy. Reproductive: Brachytherapy seeds throughout the prostate gland. Other: No abdominal wall hernia or abnormality. No abdominopelvic ascites.  Musculoskeletal: No acute fracture or aggressive appearing lytic or blastic osseous lesion. Multilevel degenerative disc disease most significant at L4-L5. Review of the MIP images confirms the above findings. IMPRESSION: 1. No evidence of aortic dissection, aneurysm or other acute vascular abnormality. 2. Scattered atherosclerotic plaque including coronary artery and aorta. Aortic Atherosclerosis (  ICD10-I70.0). 3. Mild stenosis at the origin of the right main renal artery. 4. No acute cardiopulmonary process. 5. No acute abnormality within the abdomen or pelvis. 6. 0.9 cm nonobstructing stone in the interpolar right renal collecting system. 7. Hepatic and renal cysts. 8. Highly redundant sigmoid colon noted incidentally. 9. Multilevel degenerative disc disease most significant at L4-L5. Electronically Signed   By: Jacqulynn Cadet M.D.   On: 05/20/2020 11:33    Procedures Procedures   Medications Ordered in ED Medications  iohexol (OMNIPAQUE) 350 MG/ML injection 100 mL (100 mLs Intravenous Contrast Given 05/20/20 1110)    ED Course  I have reviewed the triage vital signs and the nursing notes.  Pertinent labs & imaging results that were available during my care of the patient were reviewed by me and considered in my medical decision making (see chart for details).    MDM Rules/Calculators/A&P HEAR Score: 4                        Patient had approximately 12 hours of chest pain.  No history of previous symptoms.  No history of coronary disease.  EKG shows sinus rhythm with no acute ischemic change interval abnormality or arrhythmia but there is tachycardia.  Risk factors for PE as heart rate and age.  Risk factor for coronary artery disease age history of cigarette smoking and hypertension.  Review of an echocardiogram from last November shows good ejection fraction grade 1 diastolic function and essentially unremarkable valvular anatomy.  Will get chest x-ray cardiac biomarkers.  We will get  other screening labs for PE and organ dysfunction. HEAR score 4  First troponin is stable.  Patient is hypertensive and tachycardic.  Reassessment he is comfortable pain is getting better however with his hypertension tachycardia and review of old imaging that shows aortic and coronary artery calcifications concern for disease here let me to order CTA dissection study.  Patient will get a second troponin be reassessed afterwards.  Patient CT scan reviewed by radiology myself shows no significant acute pathology but does show consistent with coronary artery disease.  He is intermittently tachycardic here his chest pain is resolving however given his coronary artery disease seen on CT in the initial hear score 4 without that fact I do have concerns that he might need further provocative testing.  I will consult family medicine for further evaluation and management.  Final Clinical Impression(s) / ED Diagnoses Final diagnoses:  Chest pain, unspecified type  Tachycardia    Rx / DC Orders ED Discharge Orders    None       Breck Coons, MD 05/20/20 1144

## 2020-05-20 NOTE — Progress Notes (Signed)
Pt arrived to rm 6 from ED. Initiated tele. CHG wipe given. VSS. Oriented pt to the unit. Call bell within reach.   Lavenia Atlas, RN

## 2020-05-20 NOTE — H&P (Addendum)
[facility] Admission History and Physical Service Pager: [phone]  Patient name: Joel Donaldson Medical record number: [id number] Date of birth: [birth year] Age: 80 y.o. Gender: male  Primary Care Provider: [name], DO Consultants: None Code Status: Full Preferred Emergency Contact: Spouse- Annie [phone]  Chief Complaint: Epigastric pain  Assessment and Plan: Joel Donaldson is a 80 y.o. male presenting with left-sided chest pain x1 day. PMH is significant for unstable angina, hyperlipidemia, alcohol use, COPD, GERD, history of prostate cancer, rheumatoid arthritis, hypertension, opioid dependence.  Epigastric pain: Patient states this pain is in epigastric region as has been present there since last night.  He states that it feels like his typical reflux pain but he wanted to come in to make sure everything was okay.  He has negative troponins x2.  He did get a CTA to rule out dissection/PE which was negative for both.  Patient continues on room air and does not rest any shortness of breath.  He states that he also feels constipated which may be leading to his worsening epigastric pain.  Overall differential for patient's epigastric/chest pain can include GERD, his history of unstable angina, ACS less likely with troponins currently flat at 13, 14.  PE dissection study was negative.  There is also possibility this is related to constipation.  We will plan for admission for observation and reach out to cardiology to see if they would be interested in doing further modality such as stress test on this patient. -Admit to med telemetry, attending Dr. McDiarmid -Cardiology consult, appreciate recommendations -Continuous cardiac monitoring -Continues pulse ox -PT/OT evaluate and treat  History of alcohol abuse: Patient states that he drinks about 1 L/week of liquor.  He denies ever having withdrawals in the hospital setting or ever being admitted  to the ICU due to withdrawals. -CIWA protocol  History of HFpEF Last echo 12/24/2019 with ejection fraction 83-38% grade 1 diastolic dysfunction.  No current home medications.  Appears euvolemic on exam. -Continue to monitor  Constipation: Patient states his last bowel movement was about 3 days ago and that he has been feeling constipated.  He denies any blood in his stool or black stools. -MiraLAX daily  COPD Patient with known history of COPD.  Most recent CT chest on 12/28/2019 showed mild diffuse bronchial wall thickening and mild centrilobular and paraseptal emphysema suggestive of underlying COPD.  Home medications include albuterol as needed, Incruse Ellipta daily.  Patient also takes Flonase for seasonal allergies. -Continue home medications -Continuous pulse ox as above  GERD Home medications include omeprazole daily -Continue home omeprazole  Hypertension Blood pressure since admission with systolic range of 250-539 and diastolics elevated in the 90s-low 100s.  Home medications include lisinopril 20 mg daily. -Continue home lisinopril  History of osteoarthritis/rheumatoid arthritis/opioid dependence Home medications include Percocet 10-325 every 12 hours as needed.  He states that he typically takes 5-6 of these per week but if he is having significant pain in his knees will sometimes take as many as 3 or 4/day.  He states he took 2 yesterday.  PDMP shows patient last received another 150 count of oxycodone-acetaminophen 10-325 for 30-day supply on 04/20/2020.  Patient also continues with his buprenorphine patch which was started on 04/12/2020 -Continue home oxycodone 10 mg twice daily as needed for moderate pain -Acetaminophen 625 every 6 hours as needed for mild pain -We will split the Percocet up to ensure that patient does not get too high of a dose of  acetaminophen  History of unstable angina: Home medications include nitroglycerin tablets as needed. -Continue home  medicines as needed  FEN/GI: Heart healthy diet Prophylaxis: Lovenox  Disposition: Admit to med telemetry  History of Present Illness:  Joel Donaldson is a 80 y.o. male presenting with left-sided chest pain for 1 day.  Per the ED provider the patient noted this left-sided chest pain since last night.  Initial EKG showed sinus tachycardia and his troponins were stable but his D-dimer was elevated.  The provider then ordered a dissection study which showed no dissection.  Today the patient states that he noted he had a gnawing pain in his epigastric region last night.  He has a history of GERD and noted that it did feel somewhat like this.  He took a Tylenol and his aspirin and was getting ready to go to bed when this pain came back and woke him up from bed short time later.  He denies any shortness of breath.  Denies any pain radiating anywhere else when this occurred.  He did not take any of his nitroglycerin for this pain.  He states that he came in today because of this pain and wanted to make sure everything was all right.  He states he has not had a bowel movement in about 3 days and does feel constipated.  He states that at this time he continues to have this gnawing pain 6 out of 10 in the epigastric region.  He did not eat much today because he did not feel well because of this gnawing pain.  He states that he drinks about 1 pint of hard alcohol per week, no smoking for 20 years but did smoke 2 to 3 packs/day for about 10 to 15 years before quitting 20 years ago.  He denies any illicit drugs.  Patient states he has never been hospitalized for alcohol withdrawal and has never been in the ICU for alcohol withdrawal.  He states he takes his Percocet up to 3-4 times a day when he is having a lot of trouble with his knees but typically takes about 5 or 6 of these per week over the last few weeks.  He did take 2 yesterday.  Review Of Systems: Per HPI with the following additions:   Review of  Systems  Constitutional: Negative for fever.  HENT: Negative for congestion.   Eyes: Negative for visual disturbance.  Respiratory: Negative for shortness of breath.   Cardiovascular: Negative for chest pain and leg swelling.  Gastrointestinal: Positive for abdominal pain (epigastric) and constipation. Negative for nausea.  Genitourinary: Negative for dysuria.  Neurological: Negative for syncope.     Patient Active Problem List   Diagnosis Date Noted  . Angina at rest [facility]) 05/20/2020  . Neck pain 10/22/2019  . Opioid dependence ([place]) 11/29/2017  . Essential hypertension   . Baker's cyst of knee, right 06/21/2016  . Right leg swelling 06/14/2016  . Eustachian tube dysfunction 11/17/2015  . Back pain 09/19/2015  . Unspecified vitamin D deficiency 07/07/2013  . Rheumatoid arthritis ([place]) 10/10/2012  . Osteoarthritis of both shoulders 02/15/2012  . Tendinopathy of rotator cuff 01/20/2012  . Preventative health care 12/03/2011  . Osteoarthritis of both knees 07/02/2011  . Alcohol use 05/01/2010  . INTENTION TREMOR 09/09/2008  . Prostate cancer ([place]) 07/01/2008  . HYPERLIPIDEMIA 06/24/2007  . COPD (chronic obstructive pulmonary disease) ([place]) 05/12/2007  . GASTROESOPHAGEAL REFLUX, NO ESOPHAGITIS 04/11/2006    Past Medical History: Past Medical History:  Diagnosis  Date  . Alcohol abuse   . Anxiety   . Arthritis    hands, back, knees  . Asthma   . Depression   . GERD (gastroesophageal reflux disease)   . Glaucoma   . Heart murmur asymptomatic  . Hx of radioisotope therapy 07/26/11   Radiosactive Prostate Seed Implant/ I-125 Seeds  . Hypertension   . Prostate cancer ([place]) dx'd 2016 or 2017   seed implants  . Rheumatoid arthritis ([place]) 10/10/2012    Past Surgical History: Past Surgical History:  Procedure Laterality Date  . CYSTOSCOPY  09/14/2011   Procedure: CYSTOSCOPY FLEXIBLE;  Surgeon: Hanley Ben, MD;  Location: [facility];  Service: Urology;   Laterality: N/A;  No seeds seen in the bladder  . LUMBAR FUSION  1995  . RADIOACTIVE SEED IMPLANT  09/14/2011   Procedure: RADIOACTIVE SEED IMPLANT;  Surgeon: Hanley Ben, MD;  Location: [facility];  Service: Urology;  Laterality: N/A;  Total number of seeds -    . SHOULDER SURGERY  YRS AGO   LEFT    Social History: Social History   Tobacco Use  . Smoking status: Former Smoker    Packs/day: 2.00    Years: 50.00    Pack years: 100.00    Types: Cigarettes    Start date: 02/12/1953    Quit date: 10/13/2004    Years since quitting: 15.6  . Smokeless tobacco: Never Used  . Tobacco comment: Smoked 2 ppd  Substance Use Topics  . Alcohol use: Yes    Alcohol/week: 14.0 standard drinks    Types: 14 Shots of liquor per week    Comment: 1/2 pint of liquor daily  . Drug use: No   Additional social history:   Please also refer to relevant sections of EMR.  Family History: Family History  Problem Relation Age of Onset  . Diabetes Mother   . Heart disease Father        lived to 56 - had bypass, pacemaker    Allergies and Medications: Allergies  Allergen Reactions  . Tramadol Nausea Only  . Pollen Extract Other (See Comments)    Sneezing   No current facility-administered medications on file prior to encounter.   Current Outpatient Medications on File Prior to Encounter  Medication Sig Dispense Refill  . polyethylene glycol (MIRALAX / GLYCOLAX) packet Take 17 g by mouth daily as needed for mild constipation. (Patient taking differently: Take 17 g by mouth daily as needed for mild constipation (MIX AND DRINK).) 14 each 0  . albuterol (VENTOLIN HFA) 108 (90 Base) MCG/ACT inhaler Inhale 1-2 puffs into the lungs every 4 (four) hours as needed for wheezing or shortness of breath. 8.5 g 3  . aspirin EC 81 MG tablet Take 81 mg by mouth every morning.     . diclofenac sodium (VOLTAREN) 1 % GEL APPLY 2 GRAMS EXTERNALLY TO THE AFFECTED AREA FOUR TIMES DAILY AS NEEDED FOR  PAIN 100 g 0  . fluticasone (FLONASE) 50 MCG/ACT nasal spray SHAKE LIQUID AND USE 2 SPRAYS IN EACH NOSTRIL DAILY (Patient not taking: Reported on 05/20/2020) 16 g 3  . lidocaine (LIDODERM) 5 % Place 1 patch onto the skin daily. Remove & Discard patch within 12 hours or as directed by MD (Patient not taking: Reported on 05/20/2020) 30 patch 0  . lisinopril (ZESTRIL) 20 MG tablet TAKE 1 TABLET(20 MG) BY MOUTH DAILY 90 tablet 0  . nitroGLYCERIN (NITROSTAT) 0.4 MG SL tablet Place 1 tablet (0.4 mg  total) under the tongue every 5 (five) minutes as needed for chest pain (chest pain). 20 tablet 1  . omeprazole (PRILOSEC) 40 MG capsule TAKE 1 CAPSULE(40 MG) BY MOUTH EVERY MORNING 90 capsule 3  . oxyCODONE-acetaminophen (PERCOCET) 10-325 MG tablet Take 1 tablet by mouth every 12 (twelve) hours as needed for pain. 60 tablet 0  . oxyCODONE-acetaminophen (PERCOCET) 10-325 MG tablet Take 1 tablet by mouth every 12 (twelve) hours as needed for pain. 60 tablet 0  . oxyCODONE-acetaminophen (PERCOCET) 10-325 MG tablet Take 1 tablet by mouth every 12 (twelve) hours as needed for pain. 60 tablet 0  . umeclidinium bromide (INCRUSE ELLIPTA) 62.5 MCG/INH AEPB Inhale 1 puff into the lungs daily. 1 each 3    Objective: BP (!) 149/101 (BP Location: Left Arm)   Pulse (!) 103   Temp 98.3 F (36.8 C) (Oral)   Resp 17   SpO2 94%  Exam: General: Alert and oriented x4, no apparent distress  Eyes: PERRLA ENTM: No pharyengeal erythema Neck: nontender Cardiovascular: Mildly tachycardic with S1, S2 present, faint heart murmur appreciated Respiratory: Breath sounds distant/faint, no respiratory distress on room air Gastrointestinal: Bowel sounds present.  Patient does have some abdominal distention, mild discomfort to palpation in the epigastric region, no acute surgical/peritoneal findings. MSK: Normal bulk and tone  Derm: No rashes noted Neuro: No focal deficits Psych: Behavior and speech appropriate to situation  Labs and  Imaging: CBC BMET  Recent Labs  Lab 05/20/20 0656  WBC 4.4  HGB 12.9*  HCT 38.8*  PLT 160   Recent Labs  Lab 05/20/20 0656  NA 140  K 3.1*  CL 102  CO2 27  BUN 6*  CREATININE 0.85  GLUCOSE 119*  CALCIUM 9.1     EKG: My own interpretation (not copied from electronic read) sinus tachycardia, normal axis  Lurline Del, DO 05/20/2020, 12:31 PM PGY-2, [place] Intern pager: [phone], text pages welcome

## 2020-05-21 DIAGNOSIS — K219 Gastro-esophageal reflux disease without esophagitis: Secondary | ICD-10-CM | POA: Diagnosis not present

## 2020-05-21 DIAGNOSIS — J45909 Unspecified asthma, uncomplicated: Secondary | ICD-10-CM | POA: Diagnosis not present

## 2020-05-21 DIAGNOSIS — Z7982 Long term (current) use of aspirin: Secondary | ICD-10-CM | POA: Diagnosis not present

## 2020-05-21 DIAGNOSIS — J449 Chronic obstructive pulmonary disease, unspecified: Secondary | ICD-10-CM | POA: Diagnosis not present

## 2020-05-21 DIAGNOSIS — Z79899 Other long term (current) drug therapy: Secondary | ICD-10-CM | POA: Diagnosis not present

## 2020-05-21 DIAGNOSIS — Z87891 Personal history of nicotine dependence: Secondary | ICD-10-CM | POA: Diagnosis not present

## 2020-05-21 DIAGNOSIS — I10 Essential (primary) hypertension: Secondary | ICD-10-CM | POA: Diagnosis not present

## 2020-05-21 DIAGNOSIS — R079 Chest pain, unspecified: Secondary | ICD-10-CM | POA: Diagnosis not present

## 2020-05-21 DIAGNOSIS — R1013 Epigastric pain: Secondary | ICD-10-CM | POA: Diagnosis not present

## 2020-05-21 DIAGNOSIS — I700 Atherosclerosis of aorta: Secondary | ICD-10-CM | POA: Diagnosis not present

## 2020-05-21 LAB — LIPID PANEL
Cholesterol: 156 mg/dL (ref 0–200)
HDL: 72 mg/dL (ref >40)
LDL Cholesterol: 74 mg/dL (ref 0–99)
Total CHOL/HDL Ratio: 2.2 RATIO
Triglycerides: 50 mg/dL (ref <150)
VLDL: 10 mg/dL (ref 0–40)

## 2020-05-21 LAB — CBC
HCT: 38.6 % — ABNORMAL LOW (ref 39.0–52.0)
Hemoglobin: 13 g/dL (ref 13.0–17.0)
MCH: 31.3 pg (ref 26.0–34.0)
MCHC: 33.7 g/dL (ref 30.0–36.0)
MCV: 93 fL (ref 80.0–100.0)
Platelets: 149 10*3/uL — ABNORMAL LOW (ref 150–400)
RBC: 4.15 MIL/uL — ABNORMAL LOW (ref 4.22–5.81)
RDW: 15 % (ref 11.5–15.5)
WBC: 5 10*3/uL (ref 4.0–10.5)
nRBC: 0 % (ref 0.0–0.2)

## 2020-05-21 LAB — HEMOGLOBIN A1C
Hgb A1c MFr Bld: 5.6 % (ref 4.8–5.6)
Mean Plasma Glucose: 114.02 mg/dL

## 2020-05-21 LAB — BASIC METABOLIC PANEL
Anion gap: 8 (ref 5–15)
BUN: <5 mg/dL — ABNORMAL LOW (ref 8–23)
CO2: 31 mmol/L (ref 22–32)
Calcium: 9.1 mg/dL (ref 8.9–10.3)
Chloride: 100 mmol/L (ref 98–111)
Creatinine, Ser: 0.8 mg/dL (ref 0.61–1.24)
GFR, Estimated: >60 mL/min (ref >60)
Glucose, Bld: 118 mg/dL — ABNORMAL HIGH (ref 70–99)
Potassium: 3.4 mmol/L — ABNORMAL LOW (ref 3.5–5.1)
Sodium: 139 mmol/L (ref 135–145)

## 2020-05-21 MED ORDER — PANTOPRAZOLE SODIUM 40 MG PO TBEC
80.0000 mg | DELAYED_RELEASE_TABLET | Freq: Every day | ORAL | Status: DC
  Administered 2020-05-21: 80 mg via ORAL
  Filled 2020-05-21: qty 2

## 2020-05-21 MED ORDER — NITROGLYCERIN 0.4 MG SL SUBL
0.4000 mg | SUBLINGUAL_TABLET | SUBLINGUAL | 0 refills | Status: DC | PRN
Start: 2020-05-21 — End: 2021-05-25

## 2020-05-21 NOTE — Hospital Course (Addendum)
BUNYAN BRIER is a 80 y.o. male who presented with left-sided chest pain x1 day. PMH is significant for unstable angina, hyperlipidemia, alcohol use, COPD, GERD, history of prostate cancer, rheumatoid arthritis, hypertension, opioid dependence.   Chest and Epigastric pain Concern for ACS: Patient reported epigastric pain that started the night prior to presenting to the ED. He had negative troponins x2(13-14). He had a CTA to rule out dissection/PE which was negative for both.  One day after admission, patient continued to report resolved chest. EKG showed sinus tachycardia, Heart score of 4.  Hemoglobin A1c 5.6. Given resolution of pain and normal EKG in setting of normal troponin levels patient was recommended for outpatient cardiology follow up with echocardiogram.   Other chronic conditions stable during admission: Hx unstable angina Hx ETOH abuse HFpEF Constipation COPD GERD Hypertension Osteoarthritis Rheumatoid Arthritis  Opioid dependence  Follow Up Recommendations  - outpatient caridology referral for repeat ECHO - stay on daily asa until you see cardiology - discharged on nitro

## 2020-05-21 NOTE — Discharge Summary (Signed)
[facility] Discharge Summary  Patient name: Joel Donaldson Medical record number: [id number] Date of birth: June 10, 1940 Age: 80 y.o. Gender: male Date of Admission: 05/20/2020  Date of Discharge: 05/21/2020  Admitting Physician: Blane Ohara McDiarmid, MD  Primary Care Provider: [name], DO Consultants: None  Indication for Hospitalization: ACS  Discharge Diagnoses/Problem List:  Principal Problem:   Chest pain at rest Active Problems:   Hyperlipidemia   GASTROESOPHAGEAL REFLUX, NO ESOPHAGITIS   Alcohol use   Chest pain   Rheumatoid arthritis ([place])   Essential hypertension   Epigastric pain   Atherosclerosis of aorta (H[facility])   Tachycardia  Disposition: home   Discharge Condition: improved, stable  Discharge Exam:  General: male appearing stated age in no acute distress Cardio: Normal S1 and S2, no S3 or S4. Rhythm is regular.  Bilateral radial pulses palpable. No murmur. Pulm: Clear to auscultation bilaterally, no crackles, wheezing, or diminished breath sounds. Normal respiratory effort, stable on RA Abdomen: Bowel sounds normal. Abdomen soft and non-tender.  Extremities: trace peripheral edema. Warm/ well perfused.wearing compression socks up to knees  Neuro: pt alert and oriented x4    Brief Hospital Course:  Joel Donaldson is a 80 y.o. male who presented with left-sided chest pain x1 day. PMH is significant for unstable angina, hyperlipidemia, alcohol use, COPD, GERD, history of prostate cancer, rheumatoid arthritis, hypertension, opioid dependence.   Chest and Epigastric pain Concern for ACS: Patient reported epigastric pain that started the night prior to presenting to the ED. He had negative troponins x2(13-14). He had a CTA to rule out dissection/PE which was negative for both.  One day after admission, patient continued to report resolved chest. EKG showed sinus tachycardia, Heart score of 4.  Hemoglobin A1c 5.6. Given resolution of  pain and normal EKG in setting of normal troponin levels patient was recommended for outpatient cardiology follow up with echocardiogram.   Other chronic conditions stable during admission: Hx unstable angina Hx ETOH abuse HFpEF Constipation COPD GERD Hypertension Osteoarthritis Rheumatoid Arthritis  Opioid dependence  Follow Up Recommendations  - outpatient caridology referral for repeat ECHO - stay on daily asa until you see cardiology - discharged on nitro   Issues for Follow Up:  1. Please confirm patient has been scheduled for outpatient cardiology follow up to evaluate for ischemic heart disease and echocardiogram. Referral was placed at the time of discharge.   2. Confirm patient has continued daily ASA 81. He should continue this until he is evaluated by cardiology.  3. Confirm chest pain has resolved. Patient should have nitro if this pain restarts.   Significant Procedures: None   Significant Labs and Imaging:  Recent Labs  Lab 05/20/20 0656 05/21/20 0130  WBC 4.4 5.0  HGB 12.9* 13.0  HCT 38.8* 38.6*  PLT 160 149*   Recent Labs  Lab 05/20/20 0656 05/21/20 0130  NA 140 139  K 3.1* 3.4*  CL 102 100  CO2 27 31  GLUCOSE 119* 118*  BUN 6* <5*  CREATININE 0.85 0.80  CALCIUM 9.1 9.1  ALKPHOS 62  --   AST 60*  --   ALT 32  --   ALBUMIN 3.8  --     Results/Tests Pending at Time of Discharge:  None   Discharge Medications:  Allergies as of 05/21/2020      Reactions   Tramadol Nausea Only   Pollen Extract Other (See Comments)   Sneezing      Medication List  STOP taking these medications   lidocaine 5 % Commonly known as: Lidoderm   lidocaine 5 % ointment Commonly known as: XYLOCAINE   naproxen 500 MG tablet Commonly known as: NAPROSYN     TAKE these medications   acetaminophen 500 MG tablet Commonly known as: TYLENOL Take 1,000 mg by mouth See admin instructions. Take 1,000 mg by mouth two to three times a day   albuterol 108 (90  Base) MCG/ACT inhaler Commonly known as: VENTOLIN HFA Inhale 1-2 puffs into the lungs every 4 (four) hours as needed for wheezing or shortness of breath.   aspirin EC 81 MG tablet Take 81 mg by mouth every morning.   buprenorphine 7.5 MCG/HR Commonly known as: BUTRANS Place 1 patch onto the skin every Thursday.   diclofenac sodium 1 % Gel Commonly known as: VOLTAREN APPLY 2 GRAMS EXTERNALLY TO THE AFFECTED AREA FOUR TIMES DAILY AS NEEDED FOR PAIN What changed: See the new instructions.   fluticasone 50 MCG/ACT nasal spray Commonly known as: FLONASE SHAKE LIQUID AND USE 2 SPRAYS IN EACH NOSTRIL DAILY   Incruse Ellipta 62.5 MCG/INH Aepb Generic drug: umeclidinium bromide Inhale 1 puff into the lungs daily.   lisinopril 20 MG tablet Commonly known as: ZESTRIL TAKE 1 TABLET(20 MG) BY MOUTH DAILY What changed:   how much to take  how to take this  when to take this   nitroGLYCERIN 0.4 MG SL tablet Commonly known as: NITROSTAT Place 1 tablet (0.4 mg total) under the tongue every 5 (five) minutes as needed for chest pain (chest pain). What changed: reasons to take this   omeprazole 40 MG capsule Commonly known as: PRILOSEC TAKE 1 CAPSULE(40 MG) BY MOUTH EVERY MORNING What changed: See the new instructions.   One-A-Day Proactive 65+ Tabs Take 1 tablet by mouth daily with breakfast.   oxyCODONE-acetaminophen 10-325 MG tablet Commonly known as: PERCOCET Take 1 tablet by mouth every 12 (twelve) hours as needed for pain. What changed:   when to take this  additional instructions  Another medication with the same name was removed. Continue taking this medication, and follow the directions you see here.   polyethylene glycol 17 g packet Commonly known as: MIRALAX / GLYCOLAX Take 17 g by mouth daily as needed for mild constipation. What changed: reasons to take this       Discharge Instructions: Please refer to Patient Instructions section of EMR for full details.   Patient was counseled important signs and symptoms that should prompt return to medical care, changes in medications, dietary instructions, activity restrictions, and follow up appointments.   Follow-Up Appointments:   4/12 previously scheduled annual wellness for Medicare Visit   05/25/20 [facility] Follow Up  9:50 AM  Access to Care   Eulis Foster, MD 05/21/2020, 2:19 PM PGY-2, [place]

## 2020-05-21 NOTE — Progress Notes (Addendum)
Patient heart rate up to the 130s-140 on the monitor patient was eating lunch on side of bed. Patient really wants to go home. Patient resting back now in bed and heart rate decreased to low 100s 115. Will continue to monitor patient. Kypton Eltringham, Bettina Gavia RN   [phone]. Md on call for patient updated on patient heart rate. No new orders received at this time. Will monitor patient. Mayela Bullard, Bettina Gavia RN

## 2020-05-21 NOTE — Progress Notes (Signed)
Patient given discharge instructions, medication list and follow up appointments. IV and tele were dcd will discharge home as ordered. Transported to exit via wheelchair and hospital staff. Jamela Cumbo, Bettina Gavia  RN

## 2020-05-21 NOTE — Discharge Instructions (Signed)
Dear Joel Donaldson,   Thank you so much for allowing [place] to be part of your care!  [facility]. Your blood work and EKG did not show a heart attack.   We recommend that you establish care with a cardiologist to evaluate you for heart disease that may be the cause of your symptoms.   Please take aspirin 81 mg daily until you see cardiology.   If you experience chest pain, take the nitroglycerin and put it under your tongue. If the pain persists after 5 minutes, take another dose. If you still have pain after another five minutes you will need to report to the emergency department or call 911 if you do not have transportation.     POST-HOSPITAL & CARE INSTRUCTIONS 1. We have referred you to Cardiology to follow up on the chest pain. Please be on the lookout for a call from them to schedule an appointment.  2. Please continue to take Aspirin 81mg  daily until you see the cardiologist.  3. If you have chest pain, use the Nitroglycerin 5 minutes apart. If pain persists, go to the ED or call 911 if you have no one to transport you to the ED.  4. Please follow up with your primary doctor on 05/25/20.  5. Please see medications section of this packet for any medication changes.   DOCTOR'S APPOINTMENT & FOLLOW UP CARE INSTRUCTIONS  Future Appointments  Date Time Provider [place]  05/24/2020  9:00 AM Dorna Bloom, CMA [facility]  05/25/2020  9:50 AM ACCESS TO CARE POOL [facility] [place]    RETURN PRECAUTIONS: Return to care immediately if you experience chest pain that is not relieved with the nitroglycerin after two doses 5 minutes apart.   Return for care if you experience chest pain with shortness of breath,nausea, vomiting.   Take care and be well!  [facility]  [place], [place] [address] [phone]

## 2020-05-21 NOTE — Progress Notes (Signed)
I discussed this patient's case with residents.  I agree with their plan as documented in their note for today.  Patient is medically stable for discharge Recommend discharge with NTG SL with contact with EMS if no relief of pain after one tablet. Recommend continuing daily Aspirn 81 until sees cardiology Recommend Cardiology OP initial consultation within 2 weeks. F/u at [facility] within one week.    Marland KitchenHospital Day: 2   For questions or updates, please contact Family Medicine Teaching Service Resident On-call at pager [phone] or  [email or web address] - see pager "[place]"

## 2020-05-21 NOTE — Progress Notes (Signed)
Family Medicine Teaching Service Daily Progress Note Intern Pager: [phone]  Patient name: Joel Donaldson Medical record number: [id number] Date of birth: 1941/01/14 Age: 80 y.o. Gender: male  Primary Care Provider: [name], DO Consultants: Cardio Code Status: Full Code   Pt Overview and Major Events to Date:  4/8: admitted for chest pain, norm trop, resolution of pain  4/9: D/c  Assessment and Plan: Joel Donaldson is a 80 y.o. male who presented with left-sided chest pain. PMH is significant for unstable angina, hyperlipidemia, alcohol use, COPD, GERD, history of prostate cancer, rheumatoid arthritis, hypertension, opioid dependence.  Lower substernal  epigastric pain, resolved  Patient reports no chest or SOB. Suspect may be some component of underlying ischemia contributing to pain. As patient is returned to baseline and has not had pain without any emergent EKG changes, normal troponin, will plan for DC home with outpatient f.u. -referral for outpatient cardiology f/u    Heart failure preserved ejection fraction Last echo in November 2021 with ejection fraction 53-97%, grade 1 diastolic dysfunction.  Patient on no current home medications. -Echocardiogram recommended as outpatient   COPD CT chest the November 2021 with diffuse bronchial wall thickening in central lobar and paraseptal emphysema.  Home medications include Incruse Ellipta daily and albuterol as needed. -Continue Incruse Ellipta and albuterol as needed -Pulse oximetry monitoring with vitals  GERD Continue home omeprazole  History of alcohol abuse CIWA scores overnight ranged between 2-3, did not require ativan.  - CIWA    Constipation Patient reports last BM was three days ago. Denies current abdominal pain.  Continue MiraLAX daily  FEN/GI: heart healthy   PPx: lovenox    Status is: Observation  The patient remains OBS appropriate and will d/c before 2 midnights.  Dispo: The patient is  from: Home              Anticipated d/c is to: Home              Patient currently is medically stable to d/c.   Difficult to place patient No Will plan for discharge today with outpatient cardiology referral.        Subjective:  Patient reports that he is feeling well and looking forward to going home soon. He denies presence of chest pain, SOB, abdominal pain or nausea.   Objective: Temp:  [98 F (36.7 C)-99 F (37.2 C)] 99 F (37.2 C) (04/09 0818) Pulse Rate:  [80-115] 105 (04/09 0818) Resp:  [11-20] 20 (04/09 0818) BP: (113-173)/(69-107) 133/86 (04/09 0818) SpO2:  [93 %-100 %] 96 % (04/09 0923) Physical Exam: General: elderly male sitting upright, calmly watching television with glasses on  Cardiovascular: RRR, no murmur appreciated on exam today, bilateral radial pulses palpable  Respiratory: normal respiratory effort, no wheezing or crackles noted, stable on RA  Abdomen: soft, nontender, BS present in all quadrants  Extremities: dry skin on lower extremities, wearing pants and compression socks up to knees   Laboratory: Recent Labs  Lab 05/20/20 0656 05/21/20 0130  WBC 4.4 5.0  HGB 12.9* 13.0  HCT 38.8* 38.6*  PLT 160 149*   Recent Labs  Lab 05/20/20 0656 05/21/20 0130  NA 140 139  K 3.1* 3.4*  CL 102 100  CO2 27 31  BUN 6* <5*  CREATININE 0.85 0.80  CALCIUM 9.1 9.1  PROT 6.6  --   BILITOT 0.9  --   ALKPHOS 62  --   ALT 32  --   AST 60*  --  GLUCOSE 119* 118*    Imaging/Diagnostic Tests: CT Angio Chest/Abd/Pel for Dissection W and/or Wo Contrast  Result Date: 05/20/2020 CLINICAL DATA:  Chest/back pain, suspect aortic dissection EXAM: CT ANGIOGRAPHY CHEST, ABDOMEN AND PELVIS TECHNIQUE: Non-contrast CT of the chest was initially obtained. Multidetector CT imaging through the chest, abdomen and pelvis was performed using the standard protocol during bolus administration of intravenous contrast. Multiplanar reconstructed images and MIPs were obtained  and reviewed to evaluate the vascular anatomy. CONTRAST:  161mL OMNIPAQUE IOHEXOL 350 MG/ML SOLN COMPARISON:  Prior CT chest 12/28/2019; prior CT abdomen/pelvis 03/11/2010 FINDINGS: CTA CHEST FINDINGS Cardiovascular: Initial unenhanced CT imaging of the chest demonstrates no evidence of acute intramural hematoma. Following administration of contrast there is preferential opacification of the aorta. Two vessel aortic arch anatomy. The right brachiocephalic and left common carotid artery share a common origin. The aortic root, ascending and transverse thoracic aorta are all normal in caliber. There is no evidence of dissection. Mildly ectatic but not aneurysmal descending thoracic aorta. Minimal atherosclerotic plaque. Main pulmonary artery is normal in caliber. The heart is normal in size. Calcifications are present along the coronary arteries. No pericardial effusion. Mediastinum/Nodes: Unremarkable CT appearance of the thyroid gland. No suspicious mediastinal or hilar adenopathy. No soft tissue mediastinal mass. The thoracic esophagus is unremarkable. Lungs/Pleura: Lungs are clear. No pleural effusion or pneumothorax. Musculoskeletal: No acute fracture or aggressive appearing lytic or blastic osseous lesion. Review of the MIP images confirms the above findings. CTA ABDOMEN AND PELVIS FINDINGS VASCULAR Aorta: Normal caliber aorta without aneurysm, dissection, vasculitis or significant stenosis. Calcified atherosclerotic plaque. Celiac: Patent without evidence of aneurysm, dissection, vasculitis or significant stenosis. SMA: Patent without evidence of aneurysm, dissection, vasculitis or significant stenosis. Renals: There are 2 right-sided renal arteries. Heterogeneous atherosclerotic plaque results in mild stenosis of the origin of the main right renal artery. Small accessory artery to the right lower pole. On the left, there is a single main renal artery. Mild atherosclerotic plaque without significant stenosis. No  changes of fibromuscular dysplasia. No dissection or aneurysm. IMA: Patent without evidence of aneurysm, dissection, vasculitis or significant stenosis. Inflow: Patent without evidence of aneurysm, dissection, vasculitis or significant stenosis. Veins: No obvious venous abnormality within the limitations of this arterial phase study. Review of the MIP images confirms the above findings. NON-VASCULAR Hepatobiliary: No focal solid liver abnormality is seen. Circumscribed low-attenuation renal lesions appear similar compared to prior imaging from November of 2021 and are almost certainly benign cysts. No gallstones, gallbladder wall thickening, or biliary dilatation. Pancreas: Unremarkable. No pancreatic ductal dilatation or surrounding inflammatory changes. Spleen: Normal in size without focal abnormality. Adrenals/Urinary Tract: Normal adrenal glands. No evidence of hydronephrosis or enhancing renal mass. 2 cm simple cyst in the upper pole of the right kidney. 0.9 cm nonobstructing stone in the interpolar right renal collecting system. No evidence of hydronephrosis. The ureters and bladder are unremarkable. Stomach/Bowel: No evidence of obstruction or focal bowel wall thickening. Normal appendix in the right lower quadrant. The terminal ileum is unremarkable. Redundant sigmoid colon. Lymphatic: No suspicious lymphadenopathy. Reproductive: Brachytherapy seeds throughout the prostate gland. Other: No abdominal wall hernia or abnormality. No abdominopelvic ascites. Musculoskeletal: No acute fracture or aggressive appearing lytic or blastic osseous lesion. Multilevel degenerative disc disease most significant at L4-L5. Review of the MIP images confirms the above findings. IMPRESSION: 1. No evidence of aortic dissection, aneurysm or other acute vascular abnormality. 2. Scattered atherosclerotic plaque including coronary artery and aorta. Aortic Atherosclerosis (ICD10-I70.0). 3. Mild stenosis at  the origin of the right  main renal artery. 4. No acute cardiopulmonary process. 5. No acute abnormality within the abdomen or pelvis. 6. 0.9 cm nonobstructing stone in the interpolar right renal collecting system. 7. Hepatic and renal cysts. 8. Highly redundant sigmoid colon noted incidentally. 9. Multilevel degenerative disc disease most significant at L4-L5. Electronically Signed   By: Jacqulynn Cadet M.D.   On: 05/20/2020 11:33     Eulis Foster, MD 05/21/2020, 9:43 AM PGY-2, [place] Intern pager: [phone], text pages welcome

## 2020-05-24 ENCOUNTER — Ambulatory Visit (INDEPENDENT_AMBULATORY_CARE_PROVIDER_SITE_OTHER): Payer: Medicare Other

## 2020-05-24 VITALS — Ht 70.0 in | Wt 167.0 lb

## 2020-05-24 DIAGNOSIS — Z0000 Encounter for general adult medical examination without abnormal findings: Secondary | ICD-10-CM | POA: Diagnosis not present

## 2020-05-24 NOTE — Progress Notes (Addendum)
Subjective:   Joel Donaldson is a 80 y.o. male who presents for Medicare Annual/Subsequent preventive examination.  The patient consented to a virtual visit. Patient consented to have virtual visit and was identified by name and date of birth. Method of visit: Telephone   Encounter participants: Patient: Joel Donaldson - located at Home Nurse/Provider: Dorna Bloom - located at [facility] Others (if applicable): NA  Review of Systems: Defer to PCP.  Cardiac Risk Factors include: advanced age (>29men, >14 women);male gender  Objective:    Vitals: Ht 5\' 10"  (1.778 m)   Wt 167 lb (75.8 kg)   BMI 23.96 kg/m   Body mass index is 23.96 kg/m.  Advanced Directives 05/24/2020 01/01/2020 12/14/2019 12/07/2019 10/22/2019 08/26/2018 12/11/2017  Does Patient Have a Medical Advance Directive? No No No No No No No  Type of Advance Directive - - - - - - -  Does patient want to make changes to medical advance directive? - - - - - - -  Copy of [place] in Chart? - - - - - - -  Would patient like information on creating a medical advance directive? No - Patient declined No - Patient declined No - Patient declined No - Patient declined No - Patient declined No - Patient declined No - Patient declined   Tobacco Social History   Tobacco Use  Smoking Status Former Smoker  . Packs/day: 2.00  . Years: 50.00  . Pack years: 100.00  . Types: Cigarettes  . Start date: 02/12/1953  . Quit date: 10/13/2004  . Years since quitting: 15.6  Smokeless Tobacco Never Used  Tobacco Comment   Smoked 2 ppd     Counseling given: No plans to restart   Clinical Intake:  Pre-visit preparation completed: Yes  Pain Score: 0-No pain  How often do you need to have someone help you when you read instructions, pamphlets, or other written materials from your doctor or pharmacy?: 2 - Rarely What is the last grade level you completed in school?: 11th grade  Past Medical History:  Diagnosis Date  .  Alcohol abuse   . Anxiety   . Arthritis    hands, back, knees  . Asthma   . Depression   . GERD (gastroesophageal reflux disease)   . Glaucoma   . Heart murmur asymptomatic  . Hx of radioisotope therapy 07/26/11   Radiosactive Prostate Seed Implant/ I-125 Seeds  . Hypertension   . Neck pain 10/22/2019  . Prostate cancer ([place]) dx'd 2016 or 2017   seed implants  . Rheumatoid arthritis ([employer]) 10/10/2012   Past Surgical History:  Procedure Laterality Date  . CYSTOSCOPY  09/14/2011   Procedure: CYSTOSCOPY FLEXIBLE;  Surgeon: Hanley Ben, MD;  [facility];  Service: Urology;  Laterality: N/A;  No seeds seen in the bladder  . LUMBAR FUSION  1995  . RADIOACTIVE SEED IMPLANT  09/14/2011   Procedure: RADIOACTIVE SEED IMPLANT;  Surgeon: Hanley Ben, MD;  Location: [place];  Service: Urology;  Laterality: N/A;  Total number of seeds -    . SHOULDER SURGERY  YRS AGO   LEFT   Family History  Problem Relation Age of Onset  . Diabetes Mother   . Heart disease Father        lived to 80 - had bypass, pacemaker   Social History   Socioeconomic History  . Marital status: Legally Separated    Spouse name: Deneise Lever  . Number  of children: 1  . Years of education: 43  . Highest education level: Not on file  Occupational History  . Occupation: Retired    Fish farm manager: RETIRED  Tobacco Use  . Smoking status: Former Smoker    Packs/day: 2.00    Years: 50.00    Pack years: 100.00    Types: Cigarettes    Start date: 02/12/1953    Quit date: 10/13/2004    Years since quitting: 15.6  . Smokeless tobacco: Never Used  . Tobacco comment: Smoked 2 ppd  Substance and Sexual Activity  . Alcohol use: Yes    Alcohol/week: 14.0 standard drinks    Types: 14 Shots of liquor per week    Comment: 1/2 pint of liquor daily  . Drug use: No  . Sexual activity: Not Currently  Other Topics Concern  . Not on file  Social History Narrative   Patient lives with his  granddaughter in [place].   Patient is currently legally separated.    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    Patient enjoys seeing friends, hunting, and fishing.          Social Determinants of Health   Financial Resource Strain: Low Risk   . Difficulty of Paying Living Expenses: Not hard at all  Food Insecurity: No Food Insecurity  . Worried About Charity fundraiser in the Last Year: Never true  . Ran Out of Food in the Last Year: Never true  Transportation Needs: No Transportation Needs  . Lack of Transportation (Medical): No  . Lack of Transportation (Non-Medical): No  Physical Activity: Insufficiently Active  . Days of Exercise per Week: 2 days  . Minutes of Exercise per Session: 10 min  Stress: No Stress Concern Present  . Feeling of Stress : Only a little  Social Connections: Moderately Isolated  . Frequency of Communication with Friends and Family: Three times a week  . Frequency of Social Gatherings with Friends and Family: More than three times a week  . Attends Religious Services: More than 4 times per year  . Active Member of Clubs or Organizations: No  . Attends Archivist Meetings: Never  . Marital Status: Separated   Outpatient Encounter Medications as of 05/24/2020  Medication Sig  . acetaminophen (TYLENOL) 500 MG tablet Take 1,000 mg by mouth See admin instructions. Take 1,000 mg by mouth two to three times a day  . albuterol (VENTOLIN HFA) 108 (90 Base) MCG/ACT inhaler Inhale 1-2 puffs into the lungs every 4 (four) hours as needed for wheezing or shortness of breath.  Marland Kitchen aspirin EC 81 MG tablet Take 81 mg by mouth every morning.   . buprenorphine (BUTRANS) 7.5 MCG/HR Place 1 patch onto the skin every Thursday.  . diclofenac sodium (VOLTAREN) 1 % GEL APPLY 2 GRAMS EXTERNALLY TO THE AFFECTED AREA FOUR TIMES DAILY AS NEEDED FOR PAIN (Patient taking differently: Apply 2 g topically See admin instructions. Apply 2 grams topically  to bilateral knees one to four times a day for pain)  . lisinopril (ZESTRIL) 20 MG tablet TAKE 1 TABLET(20 MG) BY MOUTH DAILY (Patient taking differently: Take 20 mg by mouth daily. TAKE 1 TABLET(20 MG) BY MOUTH DAILY)  . Multiple Vitamins-Minerals (ONE-A-DAY PROACTIVE 65+) TABS Take 1 tablet by mouth daily with breakfast.  . nitroGLYCERIN (NITROSTAT) 0.4 MG SL tablet Place 1 tablet (0.4 mg total) under the tongue every 5 (five) minutes as needed for chest pain (chest pain).  Marland Kitchen omeprazole (PRILOSEC) 40  MG capsule TAKE 1 CAPSULE(40 MG) BY MOUTH EVERY MORNING (Patient taking differently: Take 40 mg by mouth daily before breakfast.)  . oxyCODONE-acetaminophen (PERCOCET) 10-325 MG tablet Take 1 tablet by mouth every 12 (twelve) hours as needed for pain. (Patient taking differently: Take 1 tablet by mouth See admin instructions. Take 1 tablet by mouth one to three times a day for pain)  . polyethylene glycol (MIRALAX / GLYCOLAX) packet Take 17 g by mouth daily as needed for mild constipation. (Patient taking differently: Take 17 g by mouth daily as needed for mild constipation (MIX AND DRINK).)  . umeclidinium bromide (INCRUSE ELLIPTA) 62.5 MCG/INH AEPB Inhale 1 puff into the lungs daily.  . fluticasone (FLONASE) 50 MCG/ACT nasal spray SHAKE LIQUID AND USE 2 SPRAYS IN EACH NOSTRIL DAILY (Patient not taking: No sig reported)   No facility-administered encounter medications on file as of 05/24/2020.   Activities of Daily Living In your present state of health, do you have any difficulty performing the following activities: 05/24/2020  Hearing? N  Vision? N  Difficulty concentrating or making decisions? N  Walking or climbing stairs? Y  Comment knee problems  Dressing or bathing? N  Doing errands, shopping? N  Preparing Food and eating ? N  Using the Toilet? N  In the past six months, have you accidently leaked urine? N  Do you have problems with loss of bowel control? N  Managing your Medications? N   Managing your Finances? N  Housekeeping or managing your Housekeeping? N  Some recent data might be hidden   Patient Care Team: [name], DO as PCP - General (Family Medicine) Clent Jacks, MD (Ophthalmology) Dr. Michell Heinrich (Rheumatology) Dr. Annamarie Dawley (Oncology)   Assessment:   This is a routine wellness examination for [place].  Exercise Activities and Dietary recommendations Current Exercise Habits: The patient does not participate in regular exercise at present, Exercise limited by: orthopedic condition(s)  Goals    . exercise 2x per week 15 minutes per time      Fall Risk Fall Risk  05/24/2020 01/01/2020 12/14/2019 10/22/2019 03/09/2019  Falls in the past year? 0 0 0 0 0  Number falls in past yr: 0 0 - 0 0  Injury with Fall? - - - 0 0  Risk for fall due to : Impaired balance/gait - - - -  Risk for fall due to: Comment - - - - -  Follow up Falls prevention discussed - - - Falls evaluation completed;Education provided   Is the patient's home free of loose throw rugs in walkways, pet beds, electrical cords, etc?   yes      Grab bars in the bathroom? yes      Handrails on the stairs?   yes      Adequate lighting?   yes  Patient rating of health (0-10): 6  Depression Screen PHQ 2/9 Scores 05/24/2020 05/24/2020 01/01/2020 12/14/2019  PHQ - 2 Score 0 0 0 2  PHQ- 9 Score - - - 6   Cognitive Function MMSE - Mini Mental State Exam 05/03/2011  Orientation to time 5  Orientation to Place 5  Registration 3  Attention/ Calculation 0  Recall 3  Language- name 2 objects 2  Language- repeat 1  Language- follow 3 step command 3  Language- read & follow direction 0  Write a sentence 0  Copy design 0  Total score 22   6CIT Screen 05/24/2020  What Year? 0 points  What month? 0 points  What time?  0 points  Count back from 20 0 points  Months in reverse 2 points  Repeat phrase 0 points  Total Score 2   Immunization History  Administered Date(s) Administered  . Fluad  Quad(high Dose 65+) 11/24/2018, 10/22/2019  . Influenza Split 12/08/2010, 12/03/2011  . Influenza Whole 11/13/2012  . Influenza, High Dose Seasonal PF 11/25/2013  . Influenza,inj,Quad PF,6+ Mos 03/16/2016, 11/02/2016  . Influenza-Unspecified 02/12/2019  . PFIZER(Purple Top)SARS-COV-2 Vaccination 04/08/2019, 04/29/2019, 11/10/2019  . Pneumococcal Conjugate-13 07/03/2013  . Pneumococcal Polysaccharide-23 10/13/2005  . Td 01/13/2003  . Tdap 03/17/2015  . Zoster 07/02/2014   Screening Tests Health Maintenance  Topic Date Due  . COVID-19 Vaccine (4 - Booster for Pfizer series) 05/09/2020  . INFLUENZA VACCINE  09/12/2020  . TETANUS/TDAP  03/16/2025  . PNA vac Low Risk Adult  Completed  . HPV VACCINES  Aged Out   Cancer Screenings: Lung: Low Dose CT Chest recommended if Age 51-80 years, 30 pack-year currently smoking OR have quit w/in 15years. Patient does not qualify. Colorectal: 05/2014  Plan:  [facility] scheduled [facility]/13 at [facility] for 9:50am. You can get your 2nd booster tomorrow as well at [facility].  Consider filling out an advance directive.  I have personally reviewed and noted the following in the patient's chart:   . Medical and social history . Use of alcohol, tobacco or illicit drugs  . Current medications and supplements . Functional ability and status . Nutritional status . Physical activity . Advanced directives . List of other physicians . Hospitalizations, surgeries, and ER visits in previous 12 months . Vitals . Screenings to include cognitive, depression, and falls . Referrals and appointments  In addition, I have reviewed and discussed with patient certain preventive protocols, quality metrics, and best practice recommendations. A written personalized care plan for preventive services as well as general preventive health recommendations were provided to patient.  This visit was conducted virtually in the setting of the [place] pandemic.    Dorna Bloom, [place]  05/24/2020     I have reviewed this visit and agree with the documentation.   [name], DO PGY-2, [place]

## 2020-05-24 NOTE — Patient Instructions (Signed)
You spoke to Joel Donaldson, [name] over the phone for your annual wellness visit.  We discussed goals: Goals    . exercise 2x per week 15 minutes per time      We also discussed recommended health maintenance. As discussed, you are up to date with everything! You can get your 2nd booster tomorrow at your apt.  Health Maintenance  Topic Date Due  . COVID-19 Vaccine (4 - Booster for Pfizer series) 05/09/2020  . INFLUENZA VACCINE  09/12/2020  . TETANUS/TDAP  03/16/2025  . PNA vac Low Risk Adult  Completed  . HPV VACCINES  Aged Out   Apt scheduled for 4/13 at [facility] for 9:50am. You can get your 2nd booster tomorrow as well at [facility].  Consider filling out an advance directive.   Health Maintenance, Male Adopting a healthy lifestyle and getting preventive care are important in promoting health and wellness. Ask your health care provider about:  The right schedule for you to have regular tests and exams.  Things you can do on your own to prevent diseases and keep yourself healthy. What should I know about diet, weight, and exercise? Eat a healthy diet  Eat a diet that includes plenty of vegetables, fruits, low-fat dairy products, and lean protein.  Do not eat a lot of foods that are high in solid fats, added sugars, or sodium.   Maintain a healthy weight Body mass index (BMI) is a measurement that can be used to identify possible weight problems. It estimates body fat based on height and weight. Your health care provider can help determine your BMI and help you achieve or maintain a healthy weight. Get regular exercise Get regular exercise. This is one of the most important things you can do for your health. Most adults should:  Exercise for at least 150 minutes each week. The exercise should increase your heart rate and make you sweat (moderate-intensity exercise).  Do strengthening exercises at least twice a week. This is in addition to the moderate-intensity exercise.  Spend less time  sitting. Even light physical activity can be beneficial. Watch cholesterol and blood lipids Have your blood tested for lipids and cholesterol at 81 years of age, then have this test every 5 years. You may need to have your cholesterol levels checked more often if:  Your lipid or cholesterol levels are high.  You are older than 81 years of age.  You are at high risk for heart disease. What should I know about cancer screening? Many types of cancers can be detected early and may often be prevented. Depending on your health history and family history, you may need to have cancer screening at various ages. This may include screening for:  Colorectal cancer.  Prostate cancer.  Skin cancer.  Lung cancer. What should I know about heart disease, diabetes, and high blood pressure? Blood pressure and heart disease  High blood pressure causes heart disease and increases the risk of stroke. This is more likely to develop in people who have high blood pressure readings, are of African descent, or are overweight.  Talk with your health care provider about your target blood pressure readings.  Have your blood pressure checked: ? Every 3-5 years if you are 52-70 years of age. ? Every year if you are 56 years old or older.  If you are between the ages of 43 and 6 and are a current or former smoker, ask your health care provider if you should have a one-time screening for  abdominal aortic aneurysm (AAA). Diabetes Have regular diabetes screenings. This checks your fasting blood sugar level. Have the screening done:  Once every three years after age 49 if you are at a normal weight and have a low risk for diabetes.  More often and at a younger age if you are overweight or have a high risk for diabetes. What should I know about preventing infection? Hepatitis B If you have a higher risk for hepatitis B, you should be screened for this virus. Talk with your health care provider to find out if you  are at risk for hepatitis B infection. Hepatitis C Blood testing is recommended for:  Everyone born from [birth year] through 1966.  Anyone with known risk factors for hepatitis C. Sexually transmitted infections (STIs)  You should be screened each year for STIs, including gonorrhea and chlamydia, if: ? You are sexually active and are younger than 81 years of age. ? You are older than 81 years of age and your health care provider tells you that you are at risk for this type of infection. ? Your sexual activity has changed since you were last screened, and you are at increased risk for chlamydia or gonorrhea. Ask your health care provider if you are at risk.  Ask your health care provider about whether you are at high risk for HIV. Your health care provider may recommend a prescription medicine to help prevent HIV infection. If you choose to take medicine to prevent HIV, you should first get tested for HIV. You should then be tested every 3 months for as long as you are taking the medicine. Follow these instructions at home: Lifestyle  Do not use any products that contain nicotine or tobacco, such as cigarettes, e-cigarettes, and chewing tobacco. If you need help quitting, ask your health care provider.  Do not use street drugs.  Do not share needles.  Ask your health care provider for help if you need support or information about quitting drugs. Alcohol use  Do not drink alcohol if your health care provider tells you not to drink.  If you drink alcohol: ? Limit how much you have to 0-2 drinks a day. ? Be aware of how much alcohol is in your drink. In the [place] one drink equals one 12 oz bottle of beer (355 mL), one 5 oz glass of wine (148 mL), or one 1 oz glass of hard liquor (44 mL). General instructions  Schedule regular health, dental, and eye exams.  Stay current with your vaccines.  Tell your health care provider if: ? You often feel depressed. ? You have ever been abused or do  not feel safe at home. Summary  Adopting a healthy lifestyle and getting preventive care are important in promoting health and wellness.  Follow your health care provider's instructions about healthy diet, exercising, and getting tested or screened for diseases.  Follow your health care provider's instructions on monitoring your cholesterol and blood pressure. This information is not intended to replace advice given to you by your health care provider. Make sure you discuss any questions you have with your health care provider. Document Revised: 01/22/2018 Document Reviewed: 01/22/2018 Elsevier Patient Education  2021 [place] Prevention in the Home, Adult Falls can cause injuries and can happen to people of all ages. There are many things you can do to make your home safe and to help prevent falls. Ask for help when making these changes. What actions can I take to prevent falls?  General Instructions  Use good lighting in all rooms. Replace any light bulbs that burn out.  Turn on the lights in dark areas. Use night-lights.  Keep items that you use often in easy-to-reach places. Lower the shelves around your home if needed.  Set up your furniture so you have a clear path. Avoid moving your furniture around.  Do not have throw rugs or other things on the floor that can make you trip.  Avoid walking on wet floors.  If any of your floors are uneven, fix them.  Add color or contrast paint or tape to clearly mark and help you see: ? Grab bars or handrails. ? First and last steps of staircases. ? Where the edge of each step is.  If you use a stepladder: ? Make sure that it is fully opened. Do not climb a closed stepladder. ? Make sure the sides of the stepladder are locked in place. ? Ask someone to hold the stepladder while you use it.  Know where your pets are when moving through your home. What can I do in the bathroom?  Keep the floor dry. Clean up any water on  the floor right away.  Remove soap buildup in the tub or shower.  Use nonskid mats or decals on the floor of the tub or shower.  Attach bath mats securely with double-sided, nonslip rug tape.  If you need to sit down in the shower, use a plastic, nonslip stool.  Install grab bars by the toilet and in the tub and shower. Do not use towel bars as grab bars.      What can I do in the bedroom?  Make sure that you have a light by your bed that is easy to reach.  Do not use any sheets or blankets for your bed that hang to the floor.  Have a firm chair with side arms that you can use for support when you get dressed. What can I do in the kitchen?  Clean up any spills right away.  If you need to reach something above you, use a step stool with a grab bar.  Keep electrical cords out of the way.  Do not use floor polish or wax that makes floors slippery. What can I do with my stairs?  Do not leave any items on the stairs.  Make sure that you have a light switch at the top and the bottom of the stairs.  Make sure that there are handrails on both sides of the stairs. Fix handrails that are broken or loose.  Install nonslip stair treads on all your stairs.  Avoid having throw rugs at the top or bottom of the stairs.  Choose a carpet that does not hide the edge of the steps on the stairs.  Check carpeting to make sure that it is firmly attached to the stairs. Fix carpet that is loose or worn. What can I do on the outside of my home?  Use bright outdoor lighting.  Fix the edges of walkways and driveways and fix any cracks.  Remove anything that might make you trip as you walk through a door, such as a raised step or threshold.  Trim any bushes or trees on paths to your home.  Check to see if handrails are loose or broken and that both sides of all steps have handrails.  Install guardrails along the edges of any raised decks and porches.  Clear paths of anything that can  make you trip,  such as tools or rocks.  Have leaves, snow, or ice cleared regularly.  Use sand or salt on paths during winter.  Clean up any spills in your garage right away. This includes grease or oil spills. What other actions can I take?  Wear shoes that: ? Have a low heel. Do not wear high heels. ? Have rubber bottoms. ? Feel good on your feet and fit well. ? Are closed at the toe. Do not wear open-toe sandals.  Use tools that help you move around if needed. These include: ? Canes. ? Walkers. ? Scooters. ? Crutches.  Review your medicines with your doctor. Some medicines can make you feel dizzy. This can increase your chance of falling. Ask your doctor what else you can do to help prevent falls. Where to find more information  Centers for Disease Control and Prevention, STEADI: http://www.wolf.info/  National Institute on Aging: http://kim-miller.com/ Contact a doctor if:  You are afraid of falling at home.  You feel weak, drowsy, or dizzy at home.  You fall at home. Summary  There are many simple things that you can do to make your home safe and to help prevent falls.  Ways to make your home safe include removing things that can make you trip and installing grab bars in the bathroom.  Ask for help when making these changes in your home. This information is not intended to replace advice given to you by your health care provider. Make sure you discuss any questions you have with your health care provider. Document Revised: 09/02/2019 Document Reviewed: 09/02/2019 Elsevier Patient Education  2021 [place].  Our clinic's number is [phone]. Please call with questions or concerns about what we discussed today.

## 2020-05-25 ENCOUNTER — Other Ambulatory Visit: Payer: Self-pay

## 2020-05-25 ENCOUNTER — Other Ambulatory Visit: Payer: Self-pay | Admitting: Family Medicine

## 2020-05-25 ENCOUNTER — Ambulatory Visit (INDEPENDENT_AMBULATORY_CARE_PROVIDER_SITE_OTHER): Payer: Medicare Other | Admitting: Family Medicine

## 2020-05-25 VITALS — BP 130/80 | HR 98 | Wt 161.8 lb

## 2020-05-25 DIAGNOSIS — R0600 Dyspnea, unspecified: Secondary | ICD-10-CM

## 2020-05-25 DIAGNOSIS — R079 Chest pain, unspecified: Secondary | ICD-10-CM

## 2020-05-25 DIAGNOSIS — R0609 Other forms of dyspnea: Secondary | ICD-10-CM

## 2020-05-25 MED ORDER — METOPROLOL SUCCINATE ER 25 MG PO TB24: 25.0000 mg | ORAL_TABLET | Freq: Every day | ORAL | 1 refills | Status: DC

## 2020-05-25 NOTE — Progress Notes (Signed)
    SUBJECTIVE:   CHIEF COMPLAINT / HPI: f/u hospital  Chest Pain: patient was admitted to the hospital from 4/8-4/9 for ACS r/o. Troponins trended flat (13-14), ekg WNL, and he had resolution of his pain so was discharged. Plan per dc summary to order outpatient echo and referral to cardiology, take ASA daily, and have nitro for chest pain. Today he reports that he has not had any further chest pain, he has been taking ASA, he has nitro at home.   PERTINENT  PMH / PSH: chest pain  OBJECTIVE:   BP 130/80   Pulse 98   Wt 161 lb 12.8 oz (73.4 kg)   SpO2 93%   BMI 23.22 kg/m   Nursing note and vitals reviewed GEN: elderly AAM resting comfortably in chair, NAD, WNWD HEENT: NCAT. Sclera without injection or icterus. Cardiac: Regular rate and rhythm. Normal S1/S2. No murmurs, rubs, or gallops appreciated. 2+ radial pulses. Lungs: Clear bilaterally to ascultation. No increased WOB, no accessory muscle usage. No w/r/r.  Neuro: Alert and at baseline Ext: no edema Psych: Pleasant and appropriate  ASSESSMENT/PLAN:   Chest pain Patient has not had further chest pain or epigastric pain since leaving the hospital. Continue ASA, nitro prn. Mildly elevated BP to 130s-150s in setting of CP, possibly ischemic, recommend starting low dose metoprolol succinate 25 mg.  Will order echo, ambulatory referral to cardiology for further outpatient work up.     Gladys Damme, MD [place]

## 2020-05-25 NOTE — Patient Instructions (Addendum)
It was a pleasure to see you today!  1. We are scheduling an echocardiogram, or ultrasound of your heart. My nurse will let you know when that is.  2. Please start taking a beta blocker- metoprolol. Please take this once a day. If you notice you feel dizzy or have low blood pressure, please call us [phone] and stop the medication.  3. I have placed a referral to cardiology. You should get a call in 1-2 weeks to schedule this appointment. If you don't get a call, please let us know [phone] and we can help make sure you get this appointment.  4. In case of chest pain- use nitroglycerin under your tongue. If after 5 minutes, the chest pain isn't better, use another nitroglycerin. If no improvement, then go to the emergency department.  Be Well,  Dr. Chauncey Reading

## 2020-05-28 NOTE — Assessment & Plan Note (Addendum)
Patient has not had further chest pain or epigastric pain since leaving the hospital. Continue ASA, nitro prn. Mildly elevated BP to 130s-150s in setting of CP, possibly ischemic, recommend starting low dose metoprolol succinate 25 mg.  Will order echo, ambulatory referral to cardiology for further outpatient work up.

## 2020-06-13 DIAGNOSIS — M25511 Pain in right shoulder: Secondary | ICD-10-CM | POA: Diagnosis not present

## 2020-06-13 DIAGNOSIS — M25562 Pain in left knee: Secondary | ICD-10-CM | POA: Diagnosis not present

## 2020-06-13 DIAGNOSIS — Z79899 Other long term (current) drug therapy: Secondary | ICD-10-CM | POA: Diagnosis not present

## 2020-06-13 DIAGNOSIS — M25561 Pain in right knee: Secondary | ICD-10-CM | POA: Diagnosis not present

## 2020-06-13 DIAGNOSIS — G8929 Other chronic pain: Secondary | ICD-10-CM | POA: Diagnosis not present

## 2020-06-27 ENCOUNTER — Other Ambulatory Visit: Payer: Self-pay

## 2020-06-27 ENCOUNTER — Encounter: Payer: Self-pay | Admitting: Family Medicine

## 2020-06-27 ENCOUNTER — Ambulatory Visit (INDEPENDENT_AMBULATORY_CARE_PROVIDER_SITE_OTHER): Payer: Medicare Other | Admitting: Family Medicine

## 2020-06-27 VITALS — BP 152/84 | HR 86 | Wt 170.0 lb

## 2020-06-27 DIAGNOSIS — J449 Chronic obstructive pulmonary disease, unspecified: Secondary | ICD-10-CM

## 2020-06-27 DIAGNOSIS — R109 Unspecified abdominal pain: Secondary | ICD-10-CM

## 2020-06-27 MED ORDER — ALBUTEROL SULFATE HFA 108 (90 BASE) MCG/ACT IN AERS: 1.0000 | INHALATION_SPRAY | RESPIRATORY_TRACT | 3 refills | Status: DC | PRN

## 2020-06-27 MED ORDER — INCRUSE ELLIPTA 62.5 MCG/INH IN AEPB: 1.0000 | INHALATION_SPRAY | Freq: Every day | RESPIRATORY_TRACT | 3 refills | Status: DC

## 2020-06-27 NOTE — Patient Instructions (Addendum)
It was great to meet you!  For your abdominal pain: - I do not think you have an ulcer - Call the office if your pain gets worse, if you develop vomiting, or if you notice blood in your stool or vomit - You should take Miralax if you are not have a bowel movement every day - Do not take more than 1000mg  of Tylenol per day (because your percocet also has Tylenol in it)  Take care and seek immediate care sooner if you develop any concerns.  Dr. [facility]

## 2020-06-27 NOTE — Progress Notes (Signed)
    SUBJECTIVE:   CHIEF COMPLAINT / HPI:   Abdominal Pain -Started ~2 weeks ago. -Fairly diffuse, but mostly located in periumbilical area. -Comes and goes, but is present most days. -Lasts for a few hours, resolves with Tylenol. -Described as dull, 6/10 at it's worst. -No nausea, vomiting, diarrhea, urinary symptoms or weight loss. -He has bowel movement daily, no blood or melena. -No clear inciting or aggravating factors. -Does not seem to be related to eating. -Patient wanted to be seen to make sure he didn't have an ulcer. -Denies alcohol use (states he used to drink but not anymore)   Of note, patient had CT angio chest/abd/pelvis April 2022 after he presented to the ED with chest pain. It showed: -No acute abnormality within the abdomen or pelvis. -Mild stenosis at the origin of the right main renal artery -0.9 cm nonobstructing stone in the interpolar right renal collecting system -Hepatic and renal cysts -Highly redundant sigmoid colon noted incidentally   PERTINENT  PMH / PSH: chronic pain/chronic opioid use, unstable angina, alcohol use disorder, rheumatoid arthritis, HTN, HLD, COPD, prostate cancer  OBJECTIVE:   BP (!) 152/84   Pulse 86   Wt 170 lb (77.1 kg)   SpO2 98%   BMI 24.39 kg/m   Gen: alert, well-appearing, NAD CV: RRR, normal Q6/V7, II/VI systolic ejection murmur appreciated  Resp: normal WOB, lungs CTAB Abd: normoactive bowel sounds, soft, nondistended, nontender, no organomegaly, no CVA tenderness  ASSESSMENT/PLAN:   Nonspecific Abdominal Pain Patient with intermittent periumbilical pain x2 weeks with no associated symptoms and no concerning findings on exam. Reassuringly, he had a CT angio of his chest/abdomen/pelvis last month which did not show any acute findings (showed few incidental findings including hepatic/renal cysts, redundant sigmoid colon, and nonobstructing renal calculus). Labs including lipase were largely unrevealing at that time.  Symptoms possibly related to his chronic pain vs some component of constipation. -Strict return precautions discussed including vomiting, dark or bloody stool, worsening pain, weight loss, etc. -Encouraged use of Miralax if patient is not having daily bowel movements -Discussed appropriate Tylenol dosing (given his concomitant Percocet use)  Medication Refills Refilled patient's Albuterol and Incruse inhalers today   Joel Dad, MD [place]

## 2020-06-28 ENCOUNTER — Ambulatory Visit (HOSPITAL_COMMUNITY)
Admission: RE | Admit: 2020-06-28 | Discharge: 2020-06-28 | Disposition: A | Discharge status: Home / Self Care | Payer: Medicare Other | Source: Ambulatory Visit | Attending: Family Medicine | Admitting: Family Medicine

## 2020-06-28 DIAGNOSIS — I10 Essential (primary) hypertension: Secondary | ICD-10-CM | POA: Diagnosis not present

## 2020-06-28 DIAGNOSIS — J449 Chronic obstructive pulmonary disease, unspecified: Secondary | ICD-10-CM | POA: Diagnosis not present

## 2020-06-28 DIAGNOSIS — E785 Hyperlipidemia, unspecified: Secondary | ICD-10-CM | POA: Diagnosis not present

## 2020-06-28 DIAGNOSIS — R0609 Other forms of dyspnea: Secondary | ICD-10-CM | POA: Diagnosis not present

## 2020-06-28 DIAGNOSIS — I351 Nonrheumatic aortic (valve) insufficiency: Secondary | ICD-10-CM | POA: Diagnosis not present

## 2020-06-28 DIAGNOSIS — R079 Chest pain, unspecified: Secondary | ICD-10-CM | POA: Insufficient documentation

## 2020-06-28 DIAGNOSIS — R0600 Dyspnea, unspecified: Secondary | ICD-10-CM | POA: Insufficient documentation

## 2020-06-28 NOTE — Progress Notes (Signed)
  Echocardiogram 2D Echocardiogram has been performed.  Joel Donaldson 06/28/2020, 8:30 AM

## 2020-06-29 ENCOUNTER — Encounter: Payer: Self-pay | Admitting: *Deleted

## 2020-06-29 LAB — ECHOCARDIOGRAM COMPLETE
Area-P 1/2: 4.49 cm2
Calc EF: 50.9 %
P 1/2 time: 264 msec
S' Lateral: 2.6 cm
Single Plane A2C EF: 50.1 %
Single Plane A4C EF: 53.1 %

## 2020-07-05 ENCOUNTER — Other Ambulatory Visit: Payer: Self-pay

## 2020-07-06 MED ORDER — OMEPRAZOLE 40 MG PO CPDR: 40.0000 mg | DELAYED_RELEASE_CAPSULE | Freq: Every day | ORAL | 2 refills | Status: DC

## 2020-07-08 ENCOUNTER — Ambulatory Visit: Payer: Medicare Other | Admitting: Cardiology

## 2020-07-08 ENCOUNTER — Other Ambulatory Visit: Payer: Self-pay | Admitting: Family Medicine

## 2020-07-08 DIAGNOSIS — I10 Essential (primary) hypertension: Secondary | ICD-10-CM

## 2020-07-13 DIAGNOSIS — E559 Vitamin D deficiency, unspecified: Secondary | ICD-10-CM | POA: Diagnosis not present

## 2020-07-13 DIAGNOSIS — M545 Low back pain, unspecified: Secondary | ICD-10-CM | POA: Diagnosis not present

## 2020-07-13 DIAGNOSIS — Z6823 Body mass index (BMI) 23.0-23.9, adult: Secondary | ICD-10-CM | POA: Diagnosis not present

## 2020-07-13 DIAGNOSIS — R030 Elevated blood-pressure reading, without diagnosis of hypertension: Secondary | ICD-10-CM | POA: Diagnosis not present

## 2020-07-13 DIAGNOSIS — M546 Pain in thoracic spine: Secondary | ICD-10-CM | POA: Diagnosis not present

## 2020-07-13 DIAGNOSIS — Z79899 Other long term (current) drug therapy: Secondary | ICD-10-CM | POA: Diagnosis not present

## 2020-07-13 DIAGNOSIS — G8929 Other chronic pain: Secondary | ICD-10-CM | POA: Diagnosis not present

## 2020-07-13 DIAGNOSIS — G894 Chronic pain syndrome: Secondary | ICD-10-CM | POA: Diagnosis not present

## 2020-08-01 ENCOUNTER — Other Ambulatory Visit: Payer: Self-pay

## 2020-08-01 ENCOUNTER — Ambulatory Visit (INDEPENDENT_AMBULATORY_CARE_PROVIDER_SITE_OTHER): Payer: Medicare Other | Admitting: Family Medicine

## 2020-08-01 VITALS — BP 152/88 | HR 69 | Wt 179.0 lb

## 2020-08-01 DIAGNOSIS — M25462 Effusion, left knee: Secondary | ICD-10-CM

## 2020-08-01 DIAGNOSIS — M25461 Effusion, right knee: Secondary | ICD-10-CM | POA: Diagnosis not present

## 2020-08-01 DIAGNOSIS — M170 Bilateral primary osteoarthritis of knee: Secondary | ICD-10-CM | POA: Diagnosis not present

## 2020-08-01 MED ORDER — METHYLPREDNISOLONE ACETATE 40 MG/ML IJ SUSP
40.0000 mg | Freq: Once | INTRAMUSCULAR | Status: AC
  Administered 2020-08-01: 40 mg via INTRA_ARTICULAR

## 2020-08-01 NOTE — Patient Instructions (Signed)
It was wonderful seeing you today.  I am sorry you are having these issues with the knee pain.  We drained some of the fluid off of your knees as well as injected some medications to help with the pain as well as calm down the inflammation/irritation.  I will let you know if anything abnormal is back on the lab test we are running on the fluid.  I hope you have a wonderful afternoon!

## 2020-08-01 NOTE — Progress Notes (Signed)
Last   SUBJECTIVE:   CHIEF COMPLAINT / HPI:   Bilateral knee joint effusions Patient reports that he has had swelling in both knees week or 2.  Cannot identify any causes for the swelling.  Reports pain but no erythema, warmth, fevers.  Reports history of osteoarthritis and may have been told about rheumatoid arthritis in the past but has not been seen for this in a long time.  PERTINENT  PMH / PSH: History of osteoarthritis of both knees, history of rheumatoid arthritis  OBJECTIVE:   BP (!) 152/88   Pulse 69   Wt 179 lb (81.2 kg)   SpO2 99%   BMI 25.68 kg/m   General: Well-appearing 80 year old male, no acute distress Cardiac: Regular rate and rhythm, no murmurs appreciated EXTR: Normal clubbing, sclerosis of bilateral MSK: Patient has bilateral joint effusions in his knees.  No erythema, warmth noted.  Full range of motion.  After informed written consent timeout was performed, patient was lying supine on exam table.  Right knee was prepped with alcohol swab.  Utilizing superolateral approach, 3 mL of lidocaine was used for local anesthesia.  Then using an 18g needle on 60cc syringe, 12 mL of clear straw-colored fluid was aspirated from  knee.  Knee was then injected with 3:1 lidocaine:depomedrol.  Patient tolerated procedure well without immediate complications  After informed written consent timeout was performed, patient was lying supine on exam table.  Left knee was prepped with alcohol swab.  Utilizing superolateral approach, 3 mL of lidocaine was used for local anesthesia.  Then using an 18g needle on 60cc syringe, 25 mL of clear straw-colored fluid was aspirated from left knee.  Knee was then injected with 3:1 lidocaine:depomedrol.  Patient tolerated procedure well without immediate complications    ASSESSMENT/PLAN:   Osteoarthritis of both knees This is a chronic issue for the patient.  He has known degenerative changes.  Joint aspiration today on both knees.  25 mL removed  from left knee, 15 mL removed from right knee.  Injected with Depo-Medrol and lidocaine.  Patient tolerated procedures well.  See procedure notes above.  Specimen sent for crystal analysis.     Gifford Shave, MD [place]

## 2020-08-01 NOTE — Assessment & Plan Note (Addendum)
This is a chronic issue for the patient.  He has known degenerative changes.  Joint aspiration today on both knees.  25 mL removed from left knee, 15 mL removed from right knee.  Injected with Depo-Medrol and lidocaine.  Patient tolerated procedures well.  See procedure notes above.  Specimen sent for crystal analysis.

## 2020-08-02 LAB — BODY FLUID CRYSTAL

## 2020-08-13 DIAGNOSIS — Z6823 Body mass index (BMI) 23.0-23.9, adult: Secondary | ICD-10-CM | POA: Diagnosis not present

## 2020-08-13 DIAGNOSIS — G8929 Other chronic pain: Secondary | ICD-10-CM | POA: Diagnosis not present

## 2020-08-13 DIAGNOSIS — R030 Elevated blood-pressure reading, without diagnosis of hypertension: Secondary | ICD-10-CM | POA: Diagnosis not present

## 2020-08-13 DIAGNOSIS — Z79899 Other long term (current) drug therapy: Secondary | ICD-10-CM | POA: Diagnosis not present

## 2020-08-13 DIAGNOSIS — G894 Chronic pain syndrome: Secondary | ICD-10-CM | POA: Diagnosis not present

## 2020-08-13 DIAGNOSIS — M545 Low back pain, unspecified: Secondary | ICD-10-CM | POA: Diagnosis not present

## 2020-08-13 DIAGNOSIS — M546 Pain in thoracic spine: Secondary | ICD-10-CM | POA: Diagnosis not present

## 2020-08-17 ENCOUNTER — Other Ambulatory Visit: Payer: Self-pay

## 2020-08-17 ENCOUNTER — Ambulatory Visit (INDEPENDENT_AMBULATORY_CARE_PROVIDER_SITE_OTHER): Payer: Medicare Other | Admitting: Family Medicine

## 2020-08-17 ENCOUNTER — Telehealth: Payer: Self-pay

## 2020-08-17 DIAGNOSIS — R0989 Other specified symptoms and signs involving the circulatory and respiratory systems: Secondary | ICD-10-CM | POA: Diagnosis not present

## 2020-08-17 MED ORDER — GUAIFENESIN 200 MG PO TABS: 200.0000 mg | ORAL_TABLET | ORAL | 0 refills | Status: DC | PRN

## 2020-08-17 NOTE — Telephone Encounter (Signed)
Pharmacist calls nurse line needed clarification on Guaifenesin prescription. Pharmacist reports this does not come in 200mg , only 1mg , 2mg  or 3mg . Please resend to [facility]. Will forward to provider who saw patient.

## 2020-08-17 NOTE — Progress Notes (Signed)
    SUBJECTIVE:   CHIEF COMPLAINT / HPI:   Congestion and phlegm in throat that started this morning.  He reports that when he coughs he has a hard time bringing up the phlegm.  He otherwise has not had any fevers, chills, persistent coughing.  No seasonal allergies or sore throat.  No headaches or COVID contacts.  He is not having any difficulty breathing, wheezing or chest pain.His only concern is about the phlegm in his throat.   PERTINENT  PMH / PSH: Rheumatoid arthritis, osteoarthritis, COPD  OBJECTIVE:   BP (!) 150/84   Pulse 84   Ht 5\' 10"  (1.778 m)   Wt 181 lb 8 oz (82.3 kg)   SpO2 95%   BMI 26.04 kg/m   General: Well-appearing, using cane for assistance HEENT: Patent nadirs.  Oropharynx clear without any tonsillar swelling.  Moist mucous membranes.  Tympanic membranes pearly gray without any erythema or fluid level. Chest: Regular rate and rhythm, 2 out of 6 systolic murmur. Lungs: Clear to auscultation bilaterally.  No wheezing or rales appreciated  ASSESSMENT/PLAN:   Phlegm in throat Possibly due to viral etiology versus allergies.  Patient does not have any other symptoms at this time.  We will treat symptomatically with guaifenesin.  No COVID testing today as this is patient's only symptom and no known contacts.  Continue to monitor and return precautions provided.   Wilber Oliphant, MD [place]

## 2020-08-17 NOTE — Assessment & Plan Note (Signed)
Possibly due to viral etiology versus allergies.  Patient does not have any other symptoms at this time.  We will treat symptomatically with guaifenesin.  No COVID testing today as this is patient's only symptom and no known contacts.  Continue to monitor and return precautions provided.

## 2020-08-17 NOTE — Patient Instructions (Signed)
Dear [name],   Today we discussed the following:   Congestion Sending guaifenesin to the pharmacy.  Please take 200 - 400 mg every 4-6 hours as needed to help with thinning out the phlegm If you develop any difficulty breathing, please go straight to the emergency room If you do not have any improvement in a couple of days, please come back to see your PCP You can also come back if you have concerns about COVID and we can test you here   For any labs obtained today, I will send results via [place].  If anything is abnormal, we will reach out to you for details on further management.   Be well,   Dr. Maudie Mercury  ===================================================================================

## 2020-08-19 ENCOUNTER — Other Ambulatory Visit: Payer: Self-pay | Admitting: Family Medicine

## 2020-08-19 NOTE — Progress Notes (Unsigned)
g  SUBJECTIVE:   CHIEF COMPLAINT / HPI:   No chief complaint on file.    Joel Donaldson is a 79 y.o. male here for ***   Pt reports ***    PERTINENT  PMH / PSH: reviewed and updated as appropriate   OBJECTIVE:   There were no vitals taken for this visit.  ***  ASSESSMENT/PLAN:   No problem-specific Assessment & Plan notes found for this encounter.     [name], DO PGY-2, [facility] 08/19/2020      {    This will disappear when note is signed, click to select method of visit    :1}

## 2020-08-24 ENCOUNTER — Encounter: Payer: Self-pay | Admitting: Cardiovascular Disease

## 2020-08-24 ENCOUNTER — Other Ambulatory Visit: Payer: Self-pay

## 2020-08-24 ENCOUNTER — Ambulatory Visit (INDEPENDENT_AMBULATORY_CARE_PROVIDER_SITE_OTHER): Payer: Medicare Other | Admitting: Cardiovascular Disease

## 2020-08-24 VITALS — BP 158/90 | HR 78 | Ht 70.0 in | Wt 179.0 lb

## 2020-08-24 DIAGNOSIS — I10 Essential (primary) hypertension: Secondary | ICD-10-CM

## 2020-08-24 DIAGNOSIS — R072 Precordial pain: Secondary | ICD-10-CM | POA: Diagnosis not present

## 2020-08-24 DIAGNOSIS — R0600 Dyspnea, unspecified: Secondary | ICD-10-CM | POA: Diagnosis not present

## 2020-08-24 DIAGNOSIS — R0609 Other forms of dyspnea: Secondary | ICD-10-CM

## 2020-08-24 MED ORDER — METOPROLOL TARTRATE 50 MG PO TABS: 50.0000 mg | ORAL_TABLET | Freq: Once | ORAL | 0 refills | Status: DC

## 2020-08-24 NOTE — Patient Instructions (Addendum)
Medication Instructions:  No changes *If you need a refill on your cardiac medications before your next appointment, please call your pharmacy*   Lab Work: None ordered If you have labs (blood work) drawn today and your tests are completely normal, you will receive your results only by: [place] (if you have MyChart) OR A paper copy in the mail If you have any lab test that is abnormal or we need to change your treatment, we will call you to review the results.  Follow-Up: At [facility], you and your health needs are our priority.  As part of our continuing mission to provide you with exceptional heart care, we have created designated Provider Care Teams.  These Care Teams include your primary Cardiologist (physician) and Advanced Practice Providers (APPs -  Physician Assistants and Nurse Practitioners) who all work together to provide you with the care you need, when you need it.  We recommend signing up for the patient portal called "MyChart".  Sign up information is provided on this After Visit Summary.  MyChart is used to connect with patients for Virtual Visits (Telemedicine).  Patients are able to view lab/test results, encounter notes, upcoming appointments, etc.  Non-urgent messages can be sent to your provider as well.   To learn more about what you can do with MyChart, go to [email or web address].    Your next appointment:   Follow up as needed with Dr. Sallyanne Kuster  Other Instructions   Your cardiac CT will be scheduled at one of the below locations:   [facility] [address] [place], [place] [address] [phone]  [place] [address] [place], [place] [address] [phone]  If scheduled at [facility], please arrive at the [facility] main entrance (entrance A) of [facility] 30 minutes prior to test start time. Proceed to the [facility] Radiology Department (first  floor) to check-in and test prep.  If scheduled at [facility], please arrive 15 mins early for check-in and test prep.  Please follow these instructions carefully (unless otherwise directed):  Hold all erectile dysfunction medications at least 3 days (72 hrs) prior to test.  On the Night Before the Test: Be sure to Drink plenty of water. Do not consume any caffeinated/decaffeinated beverages or chocolate 12 hours prior to your test. Do not take any antihistamines 12 hours prior to your test.  On the Day of the Test: Drink plenty of water until 1 hour prior to the test. Do not eat any food 4 hours prior to the test. You may take your regular medications prior to the test.  Take metoprolol (Lopressor) two hours prior to test. Take in addition to your usual 25 mg Metoprolol Succinate.   *For Clinical Staff only. Please instruct patient the following:* Heart Rate Medication Recommendations for Cardiac CT  Resting HR < 50 bpm  No medication  Resting HR 50-60 bpm and BP >110/50 mmHG   Consider Metoprolol tartrate 25 mg PO 90-120 min prior to scan  Resting HR 60-65 bpm and BP >110/50 mmHG  Metoprolol tartrate 50 mg PO 90-120 minutes prior to scan   Resting HR > 65 bpm and BP >110/50 mmHG  Metoprolol tartrate 100 mg PO 90-120 minutes prior to scan  Consider Ivabradine 10-15 mg PO or a calcium channel blocker for resting HR >60 bpm and contraindication to metoprolol tartrate  Consider Ivabradine 10-15 mg PO in combination with metoprolol tartrate for  HR >80 bpm         After the Test: Drink plenty of water. After receiving IV contrast, you may experience a mild flushed feeling. This is normal. On occasion, you may experience a mild rash up to 24 hours after the test. This is not dangerous. If this occurs, you can take Benadryl 25 mg and increase your fluid intake. If you experience trouble breathing, this can be serious. If it is severe call 911 IMMEDIATELY. If  it is mild, please call our office. If you take any of these medications: Glipizide/Metformin, Avandament, Glucavance, please do not take 48 hours after completing test unless otherwise instructed.   Once we have confirmed authorization from your insurance company, we will call you to set up a date and time for your test. Based on how quickly your insurance processes prior authorizations requests, please allow up to 4 weeks to be contacted for scheduling your Cardiac CT appointment. Be advised that routine Cardiac CT appointments could be scheduled as many as 8 weeks after your provider has ordered it.  For non-scheduling related questions, please contact the cardiac imaging nurse navigator should you have any questions/concerns: Marchia Bond, Cardiac Imaging Nurse Navigator Gordy Clement, Cardiac Imaging Nurse Navigator [facility] Direct Office Dial: [phone]   For scheduling needs, including cancellations and rescheduling, please call [place], [phone].

## 2020-08-24 NOTE — Progress Notes (Signed)
Cardiology Office Note:    Date:  08/28/2020   ID:  Joel Donaldson, DOB Mar 04, 1940, MRN [id number]  PCP:  [name], DO   [facility] Providers Cardiologist:  Sanda Klein, MD     Referring MD: Zenia Resides, MD   Chief Complaint  Patient presents with   Chest Pain   Dyspnea on exertion   Joel Donaldson is a 80 y.o. male who is being seen today for the evaluation of chest pain and dyspnea at the request of Hensel, Jamal Collin, MD.   History of Present Illness:    Joel Donaldson is a 80 y.o. male with a hx of rheumatoid arthritis and prostate cancer treated with radioactive seed implantation, who has been experiencing chest discomfort and dyspnea over the last few months.  He has a history of hypertension with onset roughly 10 years ago.  He quit smoking about 10 years ago.  He does not have diabetes mellitus or hypercholesterolemia.  He describes shortness of breath NYHA functional class II-3.  Sometimes he becomes short of breath getting dressed in the morning, but more commonly he has shortness of breath when walking, even on flat terrain.  He definitely develops dyspnea climbing stairs.  He habitually sleeps on 2 pillows and this has not changed.  He denies PND, orthopnea or lower extremity edema.  He also develops chest pain with more intense activity such as taking out the trash.  It is retrosternal and precordial and resolves within a few minutes of rest.  It has never been particularly severe and it has never occurred at rest.  He has chronic complaints of swelling and pain in the joints of his hands and his knees.  He has rheumatoid arthritis.  He was briefly hospitalized for chest and back pain in April 2022 when GI and cardiac enzymes were normal and he had a chest and abdominal CT due to the concern for possible aortic dissection.  The aorta is normal in caliber throughout.  There was no dissection.  There is only mild atherosclerotic plaque seen in the  descending aorta.  There is also evidence of probably moderate calcification in the distribution of the coronary arteries, but with particular predilection for the proximal LAD artery territory.  He has normal renal function (creatinine 0.8) and a remarkably good lipid profile with an HDL of 72 and LDL of 74.  His electrocardiogram is normal.  His echocardiogram from May 17 shows normal LVEF and wall motion, mild LVH and grade 1 diastolic dysfunction, aortic valve sclerosis without stenosis.  His father reportedly had heart disease but lived to 80 years old.  His mother was similarly long lived to age 13, had hypertension.  He was seen in the remote past by Dr. Daneen Schick.  His last interaction with cardiology was with Truitt Merle in September 2018.  In 2006 he had a normal nuclear stress test and echocardiogram.  Chest pain was also complained at that time.  Past Medical History:  Diagnosis Date   Alcohol abuse    Anxiety    Arthritis    hands, back, knees   Asthma    Depression    GERD (gastroesophageal reflux disease)    Glaucoma    Heart murmur asymptomatic   Hx of radioisotope therapy 07/26/11   Radiosactive Prostate Seed Implant/ I-125 Seeds   Hypertension    Neck pain 10/22/2019   Prostate cancer ([place]) dx'd 2016 or 2017   seed implants   Rheumatoid arthritis (  [place]) 10/10/2012    Past Surgical History:  Procedure Laterality Date   CYSTOSCOPY  09/14/2011   Procedure: CYSTOSCOPY FLEXIBLE;  Surgeon: Hanley Ben, MD;  Location: [facility];  Service: Urology;  Laterality: N/A;  No seeds seen in the bladder   [place] IMPLANT  09/14/2011   Procedure: RADIOACTIVE SEED IMPLANT;  Surgeon: Hanley Ben, MD;  Location: [facility];  Service: Urology;  Laterality: N/A;  Total number of seeds -     SHOULDER SURGERY  YRS AGO   LEFT    Current Medications: Current Meds  Medication Sig   acetaminophen (TYLENOL) 500 MG  tablet Take 1,000 mg by mouth See admin instructions. Take 1,000 mg by mouth two to three times a day   albuterol (VENTOLIN HFA) 108 (90 Base) MCG/ACT inhaler Inhale 1-2 puffs into the lungs every 4 (four) hours as needed for wheezing or shortness of breath.   aspirin EC 81 MG tablet Take 81 mg by mouth every morning.    buprenorphine (BUTRANS) 7.5 MCG/HR Place 1 patch onto the skin every Thursday.   diclofenac sodium (VOLTAREN) 1 % GEL APPLY 2 GRAMS EXTERNALLY TO THE AFFECTED AREA FOUR TIMES DAILY AS NEEDED FOR PAIN (Patient taking differently: Apply 2 g topically See admin instructions. Apply 2 grams topically to bilateral knees one to four times a day for pain)   fluticasone (FLONASE) 50 MCG/ACT nasal spray SHAKE LIQUID AND USE 2 SPRAYS IN EACH NOSTRIL DAILY   guaiFENesin 200 MG tablet Take 1 tablet (200 mg total) by mouth every 4 (four) hours as needed for cough or to loosen phlegm.   lidocaine (XYLOCAINE) 5 % ointment Apply topically as needed.   lisinopril (ZESTRIL) 20 MG tablet Take 1 tablet (20 mg total) by mouth daily. TAKE 1 TABLET(20 MG) BY MOUTH DAILY   metoprolol succinate (TOPROL-XL) 25 MG 24 hr tablet TAKE 1 TABLET(25 MG) BY MOUTH DAILY   metoprolol tartrate (LOPRESSOR) 50 MG tablet Take 1 tablet (50 mg total) by mouth once for 1 dose. Take 2 hours prior to your cardiac ct   Multiple Vitamins-Minerals (ONE-A-DAY PROACTIVE 65+) TABS Take 1 tablet by mouth daily with breakfast.   nitroGLYCERIN (NITROSTAT) 0.4 MG SL tablet Place 1 tablet (0.4 mg total) under the tongue every 5 (five) minutes as needed for chest pain (chest pain).   omeprazole (PRILOSEC) 40 MG capsule Take 1 capsule (40 mg total) by mouth daily before breakfast.   oxyCODONE-acetaminophen (PERCOCET) 10-325 MG tablet Take 1 tablet by mouth every 12 (twelve) hours as needed for pain. (Patient taking differently: Take 1 tablet by mouth See admin instructions. Take 1 tablet by mouth one to three times a day for pain)    polyethylene glycol (MIRALAX / GLYCOLAX) packet Take 17 g by mouth daily as needed for mild constipation. (Patient taking differently: Take 17 g by mouth daily as needed for mild constipation (MIX AND DRINK).)   umeclidinium bromide (INCRUSE ELLIPTA) 62.5 MCG/INH AEPB Inhale 1 puff into the lungs daily.     Allergies:   Tramadol and Pollen extract   Social History   Socioeconomic History   Marital status: Legally Separated    Spouse name: Deneise Lever   Number of children: 1   Years of education: 11   Highest education level: Not on file  Occupational History   Occupation: Retired    Fish farm manager: RETIRED  Tobacco Use   Smoking status: Former    Packs/day: 2.00  Years: 50.00    Pack years: 100.00    Types: Cigarettes    Start date: 02/12/1953    Quit date: 10/13/2004    Years since quitting: 15.8   Smokeless tobacco: Never   Tobacco comments:    Smoked 2 ppd  Substance and Sexual Activity   Alcohol use: Yes    Alcohol/week: 14.0 standard drinks    Types: 14 Shots of liquor per week    Comment: 1/2 pint of liquor daily   Drug use: No   Sexual activity: Not Currently  Other Topics Concern   Not on file  Social History Narrative   Patient lives with his granddaughter in [place].   Patient is currently legally separated.    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    Patient enjoys seeing friends, hunting, and fishing.          Social Determinants of Health   Financial Resource Strain: Low Risk    Difficulty of Paying Living Expenses: Not hard at all  Food Insecurity: No Food Insecurity   Worried About Charity fundraiser in the Last Year: Never true   [place] in the Last Year: Never true  Transportation Needs: No Transportation Needs   Lack of Transportation (Medical): No   Lack of Transportation (Non-Medical): No  Physical Activity: Insufficiently Active   Days of Exercise per Week: 2 days   Minutes of Exercise per Session: 10 min  Stress:  No Stress Concern Present   Feeling of Stress : Only a little  Social Connections: Moderately Isolated   Frequency of Communication with Friends and Family: Three times a week   Frequency of Social Gatherings with Friends and Family: More than three times a week   Attends Religious Services: More than 4 times per year   Active Member of [employer] or Organizations: No   Attends Archivist Meetings: Never   Marital Status: Separated     Family History: The patient's family history includes Diabetes in his mother; Heart disease in his father.  ROS:   Please see the history of present illness.     All other systems reviewed and are negative.  EKGs/Labs/Other Studies Reviewed:    The following studies were reviewed today: CT of the chest abdomen and pelvis with contrast 05/20/2020   IMPRESSION: 1. No evidence of aortic dissection, aneurysm or other acute vascular abnormality. 2. Scattered atherosclerotic plaque including coronary artery and aorta. Aortic Atherosclerosis (ICD10-I70.0). 3. Mild stenosis at the origin of the right main renal artery. 4. No acute cardiopulmonary process. 5. No acute abnormality within the abdomen or pelvis. 6. 0.9 cm nonobstructing stone in the interpolar right renal collecting system. 7. Hepatic and renal cysts. 8. Highly redundant sigmoid colon noted incidentally. 9. Multilevel degenerative disc disease most significant at L4-L5.   Echocardiogram 06/28/2020   1. Left ventricular ejection fraction, by estimation, is 55 to 60%. The  left ventricle has normal function. The left ventricle has no regional  wall motion abnormalities. There is mild left ventricular hypertrophy.  Left ventricular diastolic parameters  are consistent with Grade I diastolic dysfunction (impaired relaxation).   2. Right ventricular systolic function is normal. The right ventricular  size is normal.   3. The mitral valve is normal in structure. No evidence of mitral  valve  regurgitation.   4. The aortic valve is tricuspid. Aortic valve regurgitation is mild.  Mild to moderate aortic valve sclerosis/calcification is present, without  any evidence  of aortic stenosis.    EKG:  EKG is  ordered today.  The ekg ordered today demonstrates sinus rhythm, normal tracing except for borderline QTC 469 ms  Recent Labs: 12/14/2019: BNP 106.9 05/20/2020: ALT 32 05/21/2020: BUN <5; Creatinine, Ser 0.80; Hemoglobin 13.0; Platelets 149; Potassium 3.4; Sodium 139  Recent Lipid Panel    Component Value Date/Time   CHOL 156 05/21/2020 0130   CHOL 170 07/30/2017 1425   TRIG 50 05/21/2020 0130   HDL 72 05/21/2020 0130   HDL 43 07/30/2017 1425   CHOLHDL 2.2 05/21/2020 0130   VLDL 10 05/21/2020 0130   LDLCALC 74 05/21/2020 0130   LDLCALC 80 07/30/2017 1425   LDLDIRECT 99 06/24/2007 2025     Risk Assessment/Calculations:           Physical Exam:    VS:  BP (!) 158/90 (BP Location: Left Arm, Patient Position: Sitting, Cuff Size: Normal)   Pulse 78   Ht 5\' 10"  (1.778 m)   Wt 179 lb (81.2 kg)   SpO2 94%   BMI 25.68 kg/m     Wt Readings from Last 3 Encounters:  08/24/20 179 lb (81.2 kg)  08/17/20 181 lb 8 oz (82.3 kg)  08/01/20 179 lb (81.2 kg)     GEN:  Well nourished, well developed in no acute distress HEENT: Normal NECK: No JVD; No carotid bruits LYMPHATICS: No lymphadenopathy CARDIAC: RRR, no murmurs, rubs, gallops RESPIRATORY:  Clear to auscultation without rales, wheezing or rhonchi  ABDOMEN: Soft, non-tender, non-distended MUSCULOSKELETAL: Trivial puffiness of left ankle, otherwise no edema, edema; typical rheumatoid joint deformities in the metacarpophalangeal joints bilaterally SKIN: Warm and dry NEUROLOGIC:  Alert and oriented x 3 PSYCHIATRIC:  Normal affect   ASSESSMENT:    1. Precordial pain   2. Dyspnea on exertion   3. Essential hypertension    PLAN:    In order of problems listed above:  Chest pain/dyspnea on exertion: His  symptoms are definitely compatible with possible coronary artery disease unstable angina pectoris.  He does have evidence of atherosclerotic calcification in his coronary arteries on a noncardiac CT, despite his remarkably benign risk factor profile (essentially limited to hypertension and a remote history of smoking).  His lipid profile is quite good.  We will schedule for coronary CT angiography and subsequent follow-up.  It is reasonable to recommend aspirin 81 mg daily while we wait for more definitive diagnosis. HTN: Well-controlled on beta-blocker and ACE inhibitor which are both excellent choices.  Hold the ACE inhibitor and increase the dose of beta-blocker on the day of his CT        Medication Adjustments/Labs and Tests Ordered: Current medicines are reviewed at length with the patient today.  Concerns regarding medicines are outlined above.  Orders Placed This Encounter  Procedures   CT CORONARY MORPH W/CTA COR W/SCORE W/CA W/CM &/OR WO/CM   Basic metabolic panel   EKG 03-ESPQ   Meds ordered this encounter  Medications   metoprolol tartrate (LOPRESSOR) 50 MG tablet    Sig: Take 1 tablet (50 mg total) by mouth once for 1 dose. Take 2 hours prior to your cardiac ct    Dispense:  1 tablet    Refill:  0    Patient Instructions  Medication Instructions:  No changes *If you need a refill on your cardiac medications before your next appointment, please call your pharmacy*   Lab Work: None ordered If you have labs (blood work) drawn today and your tests are  completely normal, you will receive your results only by: MyChart Message (if you have MyChart) OR A paper copy in the mail If you have any lab test that is abnormal or we need to change your treatment, we will call you to review the results.  Follow-Up: At [facility], you and your health needs are our priority.  As part of our continuing mission to provide you with exceptional heart care, we have created designated  Provider Care Teams.  These Care Teams include your primary Cardiologist (physician) and Advanced Practice Providers (APPs -  Physician Assistants and Nurse Practitioners) who all work together to provide you with the care you need, when you need it.  We recommend signing up for the patient portal called "MyChart".  Sign up information is provided on this After Visit Summary.  MyChart is used to connect with patients for Virtual Visits (Telemedicine).  Patients are able to view lab/test results, encounter notes, upcoming appointments, etc.  Non-urgent messages can be sent to your provider as well.   To learn more about what you can do with MyChart, go to [email or web address].    Your next appointment:   Follow up as needed with Dr. Sallyanne Kuster  Other Instructions   Your cardiac CT will be scheduled at one of the below locations:   [facility] [address] [place], [place] [address] [phone]  [place] [address] [place], [place] [address] [phone]  If scheduled at [facility], please arrive at the [facility] main entrance (entrance A) of [facility] 30 minutes prior to test start time. Proceed to the [facility] Radiology Department (first floor) to check-in and test prep.  If scheduled at [facility], please arrive 15 mins early for check-in and test prep.  Please follow these instructions carefully (unless otherwise directed):  Hold all erectile dysfunction medications at least 3 days (72 hrs) prior to test.  On the Night Before the Test: Be sure to Drink plenty of water. Do not consume any caffeinated/decaffeinated beverages or chocolate 12 hours prior to your test. Do not take any antihistamines 12 hours prior to your test.  On the Day of the Test: Drink plenty of water until 1 hour prior to the test. Do not eat any food 4 hours prior to the test. You  may take your regular medications prior to the test.  Take metoprolol (Lopressor) two hours prior to test. Take in addition to your usual 25 mg Metoprolol Succinate.   *For Clinical Staff only. Please instruct patient the following:* Heart Rate Medication Recommendations for Cardiac CT  Resting HR < 50 bpm  No medication  Resting HR 50-60 bpm and BP >110/50 mmHG   Consider Metoprolol tartrate 25 mg PO 90-120 min prior to scan  Resting HR 60-65 bpm and BP >110/50 mmHG  Metoprolol tartrate 50 mg PO 90-120 minutes prior to scan   Resting HR > 65 bpm and BP >110/50 mmHG  Metoprolol tartrate 100 mg PO 90-120 minutes prior to scan  Consider Ivabradine 10-15 mg PO or a calcium channel blocker for resting HR >60 bpm and contraindication to metoprolol tartrate  Consider Ivabradine 10-15 mg PO in combination with metoprolol tartrate for HR >80 bpm         After the Test: Drink plenty of water. After receiving IV contrast, you may experience a mild flushed feeling. This is normal. On occasion, you may experience a mild  rash up to 24 hours after the test. This is not dangerous. If this occurs, you can take Benadryl 25 mg and increase your fluid intake. If you experience trouble breathing, this can be serious. If it is severe call 911 IMMEDIATELY. If it is mild, please call our office. If you take any of these medications: Glipizide/Metformin, Avandament, Glucavance, please do not take 48 hours after completing test unless otherwise instructed.   Once we have confirmed authorization from your insurance company, we will call you to set up a date and time for your test. Based on how quickly your insurance processes prior authorizations requests, please allow up to 4 weeks to be contacted for scheduling your Cardiac CT appointment. Be advised that routine Cardiac CT appointments could be scheduled as many as 8 weeks after your provider has ordered it.  For non-scheduling related questions, please contact  the cardiac imaging nurse navigator should you have any questions/concerns: Marchia Bond, Cardiac Imaging Nurse Navigator Gordy Clement, Cardiac Imaging Nurse Navigator [facility] Direct Office Dial: [phone]   For scheduling needs, including cancellations and rescheduling, please call [place], [phone].    Signed, Sanda Klein, MD  08/28/2020 10:55 AM    [place]

## 2020-08-30 ENCOUNTER — Other Ambulatory Visit: Payer: Self-pay

## 2020-08-30 DIAGNOSIS — R072 Precordial pain: Secondary | ICD-10-CM | POA: Diagnosis not present

## 2020-08-30 LAB — BASIC METABOLIC PANEL
BUN/Creatinine Ratio: 12 (ref 10–24)
BUN: 10 mg/dL (ref 8–27)
CO2: 24 mmol/L (ref 20–29)
Calcium: 9.4 mg/dL (ref 8.6–10.2)
Chloride: 105 mmol/L (ref 96–106)
Creatinine, Ser: 0.81 mg/dL (ref 0.76–1.27)
Glucose: 96 mg/dL (ref 65–99)
Potassium: 4.7 mmol/L (ref 3.5–5.2)
Sodium: 140 mmol/L (ref 134–144)
eGFR: 89 mL/min/{1.73_m2} (ref >59)

## 2020-09-01 ENCOUNTER — Telehealth (HOSPITAL_COMMUNITY): Payer: Self-pay | Admitting: Emergency Medicine

## 2020-09-01 NOTE — Telephone Encounter (Signed)
Reaching out to patient to offer assistance regarding upcoming cardiac imaging study; pt verbalizes understanding of appt date/time, parking situation and where to check in, pre-test NPO status and medications ordered, and verified current allergies; name and call back number provided for further questions should they arise Marchia Bond RN Navigator Cardiac Imaging [facility] [phone] office [phone] cell  Denies iv issues Denies claustro '25mg'$  metop succ + '50mg'$  metop tart Hold ACE, flonase, inhaler

## 2020-09-05 ENCOUNTER — Other Ambulatory Visit: Payer: Self-pay

## 2020-09-05 ENCOUNTER — Ambulatory Visit (HOSPITAL_COMMUNITY)
Admission: RE | Admit: 2020-09-05 | Discharge: 2020-09-05 | Disposition: A | Discharge status: Home / Self Care | Payer: Medicare Other | Source: Ambulatory Visit | Attending: Cardiovascular Disease | Admitting: Cardiovascular Disease

## 2020-09-05 ENCOUNTER — Other Ambulatory Visit: Payer: Self-pay | Admitting: Cardiovascular Disease

## 2020-09-05 ENCOUNTER — Encounter (HOSPITAL_COMMUNITY): Payer: Self-pay

## 2020-09-05 DIAGNOSIS — I2510 Atherosclerotic heart disease of native coronary artery without angina pectoris: Secondary | ICD-10-CM | POA: Diagnosis not present

## 2020-09-05 DIAGNOSIS — R931 Abnormal findings on diagnostic imaging of heart and coronary circulation: Secondary | ICD-10-CM | POA: Diagnosis not present

## 2020-09-05 DIAGNOSIS — R072 Precordial pain: Secondary | ICD-10-CM | POA: Diagnosis not present

## 2020-09-05 DIAGNOSIS — R079 Chest pain, unspecified: Secondary | ICD-10-CM | POA: Diagnosis not present

## 2020-09-05 MED ORDER — NITROGLYCERIN 0.4 MG SL SUBL
0.8000 mg | SUBLINGUAL_TABLET | Freq: Once | SUBLINGUAL | Status: AC
  Administered 2020-09-05: 0.8 mg via SUBLINGUAL

## 2020-09-05 MED ORDER — NITROGLYCERIN 0.4 MG SL SUBL
SUBLINGUAL_TABLET | SUBLINGUAL | Status: AC
  Filled 2020-09-05: qty 2

## 2020-09-05 MED ORDER — IOHEXOL 350 MG/ML SOLN
95.0000 mL | Freq: Once | INTRAVENOUS | Status: AC | PRN
  Administered 2020-09-05: 95 mL via INTRAVENOUS

## 2020-09-08 ENCOUNTER — Telehealth: Payer: Self-pay | Admitting: Cardiovascular Disease

## 2020-09-08 NOTE — Telephone Encounter (Signed)
Pt is calling for his CT test results. Please advise pt further

## 2020-09-08 NOTE — Telephone Encounter (Signed)
Patient made aware of results and verbalized understanding. Appointment made for 09/12/20   Sanda Klein, MD  09/05/2020  6:01 PM EDT      CT shows extensive CAD including one area that appears to be a severe stenosis that could explain his complaints. It may be amenable to percutaneous angioplasty and stent. We need to make arrangements for cardiac cath. Please call him and ask if he wants a face to face appointment or whether I can just discuss the details with him by phone and then schedule the procedure.

## 2020-09-10 DIAGNOSIS — G8929 Other chronic pain: Secondary | ICD-10-CM | POA: Diagnosis not present

## 2020-09-10 DIAGNOSIS — M545 Low back pain, unspecified: Secondary | ICD-10-CM | POA: Diagnosis not present

## 2020-09-10 DIAGNOSIS — Z79899 Other long term (current) drug therapy: Secondary | ICD-10-CM | POA: Diagnosis not present

## 2020-09-10 DIAGNOSIS — Z6823 Body mass index (BMI) 23.0-23.9, adult: Secondary | ICD-10-CM | POA: Diagnosis not present

## 2020-09-10 DIAGNOSIS — G894 Chronic pain syndrome: Secondary | ICD-10-CM | POA: Diagnosis not present

## 2020-09-10 DIAGNOSIS — R030 Elevated blood-pressure reading, without diagnosis of hypertension: Secondary | ICD-10-CM | POA: Diagnosis not present

## 2020-09-10 DIAGNOSIS — M546 Pain in thoracic spine: Secondary | ICD-10-CM | POA: Diagnosis not present

## 2020-09-12 ENCOUNTER — Ambulatory Visit (INDEPENDENT_AMBULATORY_CARE_PROVIDER_SITE_OTHER): Payer: Medicare Other | Admitting: Cardiovascular Disease

## 2020-09-12 ENCOUNTER — Encounter: Payer: Self-pay | Admitting: Cardiovascular Disease

## 2020-09-12 ENCOUNTER — Other Ambulatory Visit: Payer: Self-pay

## 2020-09-12 VITALS — BP 142/84 | HR 78 | Ht 70.0 in | Wt 171.8 lb

## 2020-09-12 DIAGNOSIS — Z01812 Encounter for preprocedural laboratory examination: Secondary | ICD-10-CM

## 2020-09-12 DIAGNOSIS — Z01818 Encounter for other preprocedural examination: Secondary | ICD-10-CM

## 2020-09-12 DIAGNOSIS — I10 Essential (primary) hypertension: Secondary | ICD-10-CM | POA: Diagnosis not present

## 2020-09-12 DIAGNOSIS — I25700 Atherosclerosis of coronary artery bypass graft(s), unspecified, with unstable angina pectoris: Secondary | ICD-10-CM

## 2020-09-12 DIAGNOSIS — R079 Chest pain, unspecified: Secondary | ICD-10-CM | POA: Diagnosis not present

## 2020-09-12 MED ORDER — ATORVASTATIN CALCIUM 20 MG PO TABS: 20.0000 mg | ORAL_TABLET | Freq: Every day | ORAL | 3 refills | Status: DC

## 2020-09-12 NOTE — Patient Instructions (Addendum)
Medication Instructions:  START Atorvastatin 20 mg once daily *If you need a refill on your cardiac medications before your next appointment, please call your pharmacy*   Lab Work: Your provider would like for you to have the following labs today: CBC and BMET  If you have labs (blood work) drawn today and your tests are completely normal, you will receive your results only by: MyChart Message (if you have MyChart) OR A paper copy in the mail If you have any lab test that is abnormal or we need to change your treatment, we will call you to review the results.   Testing/Procedures: Your physician has requested that you have a cardiac catheterization. Cardiac catheterization is used to diagnose and/or treat various heart conditions. Doctors may recommend this procedure for a number of different reasons. The most common reason is to evaluate chest pain. Chest pain can be a symptom of coronary artery disease (CAD), and cardiac catheterization can show whether plaque is narrowing or blocking your heart's arteries. This procedure is also used to evaluate the valves, as well as measure the blood flow and oxygen levels in different parts of your heart. For further information please visit [email or web address]. Please follow instruction sheet, as given.   Follow-Up: At [facility], you and your health needs are our priority.  As part of our continuing mission to provide you with exceptional heart care, we have created designated Provider Care Teams.  These Care Teams include your primary Cardiologist (physician) and Advanced Practice Providers (APPs -  Physician Assistants and Nurse Practitioners) who all work together to provide you with the care you need, when you need it.  We recommend signing up for the patient portal called "MyChart".  Sign up information is provided on this After Visit Summary.  MyChart is used to connect with patients for Virtual Visits (Telemedicine).  Patients are able to view  lab/test results, encounter notes, upcoming appointments, etc.  Non-urgent messages can be sent to your provider as well.   To learn more about what you can do with MyChart, go to [email or web address].    Your next appointment:   Follow up in 2-3 weeks with an APP Follow up in 3-4 months with Dr. Sallyanne Kuster  Other Instructions  [facility] CARDIOVASCULAR DIVISION [facility] [address] [place] [address] Dept: [phone] Loc: [place]  09/12/2020  You are scheduled for a Cardiac Catheterization on Friday August 5th with Dr. Ellyn Hack  1. Please arrive at the [facility] (Main Entrance A) at [facility]: [address] [place], [place] [address] at 8:30 (This time is two hours before your procedure to ensure your preparation). Free valet parking service is available.   Special note: Every effort is made to have your procedure done on time. Please understand that emergencies sometimes delay scheduled procedures.  2. Diet: Do not eat solid foods after midnight.  The patient may have clear liquids until 5am upon the day of the procedure.  3. Labs: You will need to have blood drawn on 09/12/20  4. Medication instructions in preparation for your procedure: Nothing to hold   On the morning of your procedure, take your Aspirin and any morning medicines NOT listed above.  You may use sips of water.  5. Plan for one night stay--bring personal belongings. 6. Bring a current list of your medications and current insurance cards. 7. You MUST have a responsible person to drive you home. 8. Someone  MUST be with you the first 24 hours after you arrive home or your discharge will be delayed. 9. Please wear clothes that are easy to get on and off and wear slip-on shoes.  Thank you for allowing [place] to care for you!   -- [place] [facility]

## 2020-09-12 NOTE — H&P (View-Only) (Signed)
Cardiology Office Note:    Date:  09/12/2020   ID:  Joel Donaldson, DOB 10-Apr-1940, MRN [id number]  PCP:  [name], DO   [facility] Providers Cardiologist:  Sanda Klein, MD     Referring MD: [name], DO   Chief Complaint  Patient presents with   Coronary Artery Disease    Joel Donaldson is a 80 y.o. male who was referred for the evaluation of chest pain and dyspnea at the request of [name], DO.    History of Present Illness:    Joel Donaldson is a 80 y.o. male with a hx of rheumatoid arthritis and prostate cancer treated with radioactive seed implantation, who has been experiencing chest discomfort and dyspnea over the last few months.  He has a history of hypertension with onset roughly 10 years ago.  He quit smoking about 10 years ago.  He does not have diabetes mellitus or hypercholesterolemia.  He just under coronary CT angiogram which showed a remarkably severe coronary atherosclerosis.  The calcium score placing him in the 90th percentile for age/gender.  There was particularly severe calcium buildup in the LAD artery.  By morphological criteria, there was severe stenosis in the distal left circumflex coronary artery where there was a severe stenosis with mild stenoses in the right coronary artery, proximal-mid LAD and OM1.  However by FFR, significant reduction in flow was seen in all 3 major coronary territories.  1. Coronary calcium score of 1523. This was 90th percentile for age-, race-, and sex-matched controls.   2. Normal coronary origin with left dominance.   3. There is severe (>70%) plaque in the distal LCX, and mild (25-49% stenosis in RCA, LAD, and OM1. CAD-RADS 4.  1. Left Main: FFRct 1.0   2. LAD: FFRct 0.95 proximal, 0.89 mid, 0.8 distal   3. LCX: FFRct 0.98 proximal, 0.98 mid, 0.77 distal.  OM1 FFRct 0.8   4. RCA: There are two small vessels arising form the RCA ostium. FFRct for RCA1 is 0.85. FFRct for RCA2 is 0.92  proximally and 0.69 mid.  He describes shortness of breath NYHA functional class II-3.  Sometimes he becomes short of breath getting dressed in the morning, but more commonly he has shortness of breath when walking, even on flat terrain.  He definitely develops dyspnea climbing stairs.  He habitually sleeps on 2 pillows and this has not changed.  He denies PND, orthopnea or lower extremity edema.  He also develops chest pain with more intense activity such as taking out the trash.  It is retrosternal and precordial and resolves within a few minutes of rest.  It has never been particularly severe and it has never occurred at rest.  He has chronic complaints of swelling and pain in the joints of his hands and his knees.  He has rheumatoid arthritis.  He was briefly hospitalized for chest and back pain in April 2022 when GI and cardiac enzymes were normal and he had a chest and abdominal CT due to the concern for possible aortic dissection.  The aorta is normal in caliber throughout.  There was no dissection.  There is only mild atherosclerotic plaque seen in the descending aorta.  There is also evidence of probably moderate calcification in the distribution of the coronary arteries, but with particular predilection for the proximal LAD artery territory.  He has normal renal function (creatinine 0.8) and a remarkably good lipid profile with an HDL of 72 and LDL of 74.  His electrocardiogram is normal.  His echocardiogram from May 17 shows normal LVEF and wall motion, mild LVH and grade 1 diastolic dysfunction, aortic valve sclerosis without stenosis.  His father reportedly had heart disease but lived to 39 years old.  His mother was similarly long lived to age 50, had hypertension.  He was seen in the remote past by Dr. Daneen Schick.  His last interaction with cardiology was with Truitt Merle in September 2018.  In 2006 he had a normal nuclear stress test and echocardiogram.  Chest pain was also  complained at that time.  Past Medical History:  Diagnosis Date   Alcohol abuse    Anxiety    Arthritis    hands, back, knees   Asthma    Depression    GERD (gastroesophageal reflux disease)    Glaucoma    Heart murmur asymptomatic   Hx of radioisotope therapy 07/26/11   Radiosactive Prostate Seed Implant/ I-125 Seeds   Hypertension    Neck pain 10/22/2019   Prostate cancer ([place]) dx'd 2016 or 2017   seed implants   Rheumatoid arthritis ([place]) 10/10/2012    Past Surgical History:  Procedure Laterality Date   CYSTOSCOPY  09/14/2011   Procedure: CYSTOSCOPY FLEXIBLE;  Surgeon: Hanley Ben, MD;  Location: [facility];  Service: Urology;  Laterality: N/A;  No seeds seen in the bladder   [place] IMPLANT  09/14/2011   Procedure: RADIOACTIVE SEED IMPLANT;  Surgeon: Hanley Ben, MD;  Location: [facility];  Service: Urology;  Laterality: N/A;  Total number of seeds -     SHOULDER SURGERY  YRS AGO   LEFT    Current Medications: Current Meds  Medication Sig   acetaminophen (TYLENOL) 500 MG tablet Take 1,000 mg by mouth See admin instructions. Take 1,000 mg by mouth two to three times a day   albuterol (VENTOLIN HFA) 108 (90 Base) MCG/ACT inhaler Inhale 1-2 puffs into the lungs every 4 (four) hours as needed for wheezing or shortness of breath.   aspirin EC 81 MG tablet Take 81 mg by mouth every morning.    atorvastatin (LIPITOR) 20 MG tablet Take 1 tablet (20 mg total) by mouth daily.   buprenorphine (BUTRANS) 7.5 MCG/HR Place 1 patch onto the skin every Thursday.   diclofenac sodium (VOLTAREN) 1 % GEL APPLY 2 GRAMS EXTERNALLY TO THE AFFECTED AREA FOUR TIMES DAILY AS NEEDED FOR PAIN (Patient taking differently: Apply 2 g topically See admin instructions. Apply 2 grams topically to bilateral knees one to four times a day for pain)   fluticasone (FLONASE) 50 MCG/ACT nasal spray SHAKE LIQUID AND USE 2 SPRAYS IN EACH NOSTRIL DAILY    lidocaine (XYLOCAINE) 5 % ointment Apply topically as needed.   lisinopril (ZESTRIL) 20 MG tablet Take 1 tablet (20 mg total) by mouth daily. TAKE 1 TABLET(20 MG) BY MOUTH DAILY   metoprolol succinate (TOPROL-XL) 25 MG 24 hr tablet TAKE 1 TABLET(25 MG) BY MOUTH DAILY   Multiple Vitamins-Minerals (ONE-A-DAY PROACTIVE 65+) TABS Take 1 tablet by mouth daily with breakfast.   omeprazole (PRILOSEC) 40 MG capsule Take 1 capsule (40 mg total) by mouth daily before breakfast.   umeclidinium bromide (INCRUSE ELLIPTA) 62.5 MCG/INH AEPB Inhale 1 puff into the lungs daily.     Allergies:   Tramadol and Pollen extract   Social History   Socioeconomic History   Marital status: Legally Separated    Spouse name: Deneise Lever  Number of children: 1   Years of education: 11   Highest education level: Not on file  Occupational History   Occupation: Retired    Fish farm manager: RETIRED  Tobacco Use   Smoking status: Former    Packs/day: 2.00    Years: 50.00    Pack years: 100.00    Types: Cigarettes    Start date: 02/12/1953    Quit date: 10/13/2004    Years since quitting: 15.9   Smokeless tobacco: Never   Tobacco comments:    Smoked 2 ppd  Substance and Sexual Activity   Alcohol use: Yes    Alcohol/week: 14.0 standard drinks    Types: 14 Shots of liquor per week    Comment: 1/2 pint of liquor daily   Drug use: No   Sexual activity: Not Currently  Other Topics Concern   Not on file  Social History Narrative   Patient lives with his granddaughter in [place].   Patient is currently legally separated.    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    Patient enjoys seeing friends, hunting, and fishing.          Social Determinants of Health   Financial Resource Strain: Low Risk    Difficulty of Paying Living Expenses: Not hard at all  Food Insecurity: No Food Insecurity   Worried About Charity fundraiser in the Last Year: Never true   [place] in the Last Year: Never  true  Transportation Needs: No Transportation Needs   Lack of Transportation (Medical): No   Lack of Transportation (Non-Medical): No  Physical Activity: Insufficiently Active   Days of Exercise per Week: 2 days   Minutes of Exercise per Session: 10 min  Stress: No Stress Concern Present   Feeling of Stress : Only a little  Social Connections: Moderately Isolated   Frequency of Communication with Friends and Family: Three times a week   Frequency of Social Gatherings with Friends and Family: More than three times a week   Attends Religious Services: More than 4 times per year   Active Member of [employer] or Organizations: No   Attends Archivist Meetings: Never   Marital Status: Separated     Family History: The patient's family history includes Diabetes in his mother; Heart disease in his father.  ROS:   Please see the history of present illness.     All other systems reviewed and are negative.  EKGs/Labs/Other Studies Reviewed:    The following studies were reviewed today: 09/05/2020 Left main: 0   Left anterior descending artery: 989   Left circumflex artery: 259   Right coronary artery: 276   Total: 1523   Percentile: 90th   Other findings:   Normal pulmonary vein drainage into the left atrium.   Normal let atrial appendage without a thrombus.   Normal size of the pulmonary artery.   IMPRESSION: 1. Coronary calcium score of 1523. This was 90th percentile for age-, race-, and sex-matched controls.   2. Normal coronary origin with left dominance.   3. There is severe (>70%) plaque in the distal LCX, and mild (25-49% stenosis in RCA, LAD, and OM1. CAD-RADS 4. 1. Left Main: FFRct 1.0   2. LAD: FFRct 0.95 proximal, 0.89 mid, 0.8 distal   3. LCX: FFRct 0.98 proximal, 0.98 mid, 0.77 distal.  OM1 FFRct 0.8   4. RCA: There are two small vessels arising form the RCA ostium. FFRct for RCA1 is 0.85. FFRct for  RCA2 is 0.92 proximally and  0.69 mid.   Echocardiogram 06/28/2020   1. Left ventricular ejection fraction, by estimation, is 55 to 60%. The  left ventricle has normal function. The left ventricle has no regional  wall motion abnormalities. There is mild left ventricular hypertrophy.  Left ventricular diastolic parameters  are consistent with Grade I diastolic dysfunction (impaired relaxation).   2. Right ventricular systolic function is normal. The right ventricular  size is normal.   3. The mitral valve is normal in structure. No evidence of mitral valve  regurgitation.   4. The aortic valve is tricuspid. Aortic valve regurgitation is mild.  Mild to moderate aortic valve sclerosis/calcification is present, without  any evidence of aortic stenosis.    EKG:  EKG is  ordered today.  The ekg ordered today demonstrates sinus rhythm, normal tracing except for borderline QTC 469 ms  Recent Labs: 12/14/2019: BNP 106.9 05/20/2020: ALT 32 05/21/2020: Hemoglobin 13.0; Platelets 149 08/30/2020: BUN 10; Creatinine, Ser 0.81; Potassium 4.7; Sodium 140  Recent Lipid Panel    Component Value Date/Time   CHOL 156 05/21/2020 0130   CHOL 170 07/30/2017 1425   TRIG 50 05/21/2020 0130   HDL 72 05/21/2020 0130   HDL 43 07/30/2017 1425   CHOLHDL 2.2 05/21/2020 0130   VLDL 10 05/21/2020 0130   LDLCALC 74 05/21/2020 0130   LDLCALC 80 07/30/2017 1425   LDLDIRECT 99 06/24/2007 2025     Risk Assessment/Calculations:           Physical Exam:    VS:  BP (!) 142/84 (BP Location: Left Arm, Patient Position: Sitting, Cuff Size: Normal)   Pulse 78   Ht '5\' 10"'$  (1.778 m)   Wt 171 lb 12.8 oz (77.9 kg)   SpO2 99%   BMI 24.65 kg/m     Wt Readings from Last 3 Encounters:  09/12/20 171 lb 12.8 oz (77.9 kg)  08/24/20 179 lb (81.2 kg)  08/17/20 181 lb 8 oz (82.3 kg)      General: Alert, oriented x3, no distress Head: no evidence of trauma, PERRL, EOMI, no exophtalmos or lid lag, no myxedema, no xanthelasma; normal ears, nose  and oropharynx Neck: normal jugular venous pulsations and no hepatojugular reflux; brisk carotid pulses without delay and no carotid bruits Chest: clear to auscultation, no signs of consolidation by percussion or palpation, normal fremitus, symmetrical and full respiratory excursions Cardiovascular: normal position and quality of the apical impulse, regular rhythm, normal first and second heart sounds, no murmurs, rubs or gallops Abdomen: no tenderness or distention, no masses by palpation, no abnormal pulsatility or arterial bruits, normal bowel sounds, no hepatosplenomegaly Extremities: no clubbing, cyanosis or edema; 2+ radial, ulnar and brachial pulses bilaterally; 2+ right femoral, posterior tibial and dorsalis pedis pulses; 2+ left femoral, posterior tibial and dorsalis pedis pulses; no subclavian or femoral bruits Neurological: grossly nonfocal Psych: Normal mood and affect   ASSESSMENT:    1. Coronary artery disease involving coronary bypass graft of native heart with unstable angina pectoris ([place])   2. Pre-procedure lab exam   3. Essential hypertension     PLAN:    In order of problems listed above:  CAD/angina/dyspnea on exertion: On beta blocker and aspirin 81 mg daily.  Start atorvastatin 20 mg daily (he has a remarkable severity of plaque burden despite a pretty good lipid profile).  We will schedule him for invasive angiography since there is a discrepancy between his symptoms and the widespread atherosclerotic plaque, but with  unimpressive FFR values. The diagnostic cath procedure and possible PCI/stent has been fully reviewed with the patient and written informed consent has been obtained. HTN: Usually well controlled.  Hold the ACE inhibitor on the day of his procedure.          Medication Adjustments/Labs and Tests Ordered: Current medicines are reviewed at length with the patient today.  Concerns regarding medicines are outlined above.  Orders Placed This Encounter   Procedures   Basic metabolic panel   CBC   EKG 12-Lead    Meds ordered this encounter  Medications   atorvastatin (LIPITOR) 20 MG tablet    Sig: Take 1 tablet (20 mg total) by mouth daily.    Dispense:  90 tablet    Refill:  3     Patient Instructions  Medication Instructions:  START Atorvastatin 20 mg once daily *If you need a refill on your cardiac medications before your next appointment, please call your pharmacy*   Lab Work: Your provider would like for you to have the following labs today: CBC and BMET  If you have labs (blood work) drawn today and your tests are completely normal, you will receive your results only by: MyChart Message (if you have MyChart) OR A paper copy in the mail If you have any lab test that is abnormal or we need to change your treatment, we will call you to review the results.   Testing/Procedures: Your physician has requested that you have a cardiac catheterization. Cardiac catheterization is used to diagnose and/or treat various heart conditions. Doctors may recommend this procedure for a number of different reasons. The most common reason is to evaluate chest pain. Chest pain can be a symptom of coronary artery disease (CAD), and cardiac catheterization can show whether plaque is narrowing or blocking your heart's arteries. This procedure is also used to evaluate the valves, as well as measure the blood flow and oxygen levels in different parts of your heart. For further information please visit [email or web address]. Please follow instruction sheet, as given.   Follow-Up: At [facility] you and your health needs are our priority.  As part of our continuing mission to provide you with exceptional heart care, we have created designated Provider Care Teams.  These Care Teams include your primary Cardiologist (physician) and Advanced Practice Providers (APPs -  Physician Assistants and Nurse Practitioners) who all work together to provide you with  the care you need, when you need it.  We recommend signing up for the patient portal called "MyChart".  Sign up information is provided on this After Visit Summary.  MyChart is used to connect with patients for Virtual Visits (Telemedicine).  Patients are able to view lab/test results, encounter notes, upcoming appointments, etc.  Non-urgent messages can be sent to your provider as well.   To learn more about what you can do with MyChart, go to [email or web address].    Your next appointment:   Follow up in 2-3 weeks with an APP Follow up in 3-4 months with Dr. Sallyanne Kuster  Other Instructions  [facility] CARDIOVASCULAR DIVISION [facility] [place] [address] Dept: [phone] Loc: [place]  09/12/2020  You are scheduled for a Cardiac Catheterization on Wednesday, August 3 with Dr. Ellyn Hack  1. Please arrive at the [facility] (Main Entrance A) at [facility]: [address] [place], [place] [address] at 8:30 (This time is two hours before your procedure to ensure  your preparation). Free valet parking service is available.   Special note: Every effort is made to have your procedure done on time. Please understand that emergencies sometimes delay scheduled procedures.  2. Diet: Do not eat solid foods after midnight.  The patient may have clear liquids until 5am upon the day of the procedure.  3. Labs: You will need to have blood drawn on 09/12/20  4. Medication instructions in preparation for your procedure: Nothing to hold   On the morning of your procedure, take your Aspirin and any morning medicines NOT listed above.  You may use sips of water.  5. Plan for one night stay--bring personal belongings. 6. Bring a current list of your medications and current insurance cards. 7. You MUST have a responsible person to drive you home. 8. Someone MUST be with you the first 24 hours after you  arrive home or your discharge will be delayed. 9. Please wear clothes that are easy to get on and off and wear slip-on shoes.  Thank you for allowing [place] to care for you!   -- [place] [facility]   Signed, Sanda Klein, MD  09/12/2020 3:20 PM    [place]

## 2020-09-12 NOTE — Progress Notes (Signed)
Cardiology Office Note:    Date:  09/12/2020   ID:  Joel Donaldson, DOB 1941-01-31, MRN [id number]  PCP:  [name], DO   [facility] Providers Cardiologist:  Sanda Klein, MD     Referring MD: [name], DO   Chief Complaint  Patient presents with   Coronary Artery Disease    Joel Donaldson is a 80 y.o. male who was referred for the evaluation of chest pain and dyspnea at the request of [name], DO.    History of Present Illness:    Joel Donaldson is a 80 y.o. male with a hx of rheumatoid arthritis and prostate cancer treated with radioactive seed implantation, who has been experiencing chest discomfort and dyspnea over the last few months.  He has a history of hypertension with onset roughly 10 years ago.  He quit smoking about 10 years ago.  He does not have diabetes mellitus or hypercholesterolemia.  He just under coronary CT angiogram which showed a remarkably severe coronary atherosclerosis.  The calcium score placing him in the 90th percentile for age/gender.  There was particularly severe calcium buildup in the LAD artery.  By morphological criteria, there was severe stenosis in the distal left circumflex coronary artery where there was a severe stenosis with mild stenoses in the right coronary artery, proximal-mid LAD and OM1.  However by FFR, significant reduction in flow was seen in all 3 major coronary territories.  1. Coronary calcium score of 1523. This was 90th percentile for age-, race-, and sex-matched controls.   2. Normal coronary origin with left dominance.   3. There is severe (>70%) plaque in the distal LCX, and mild (25-49% stenosis in RCA, LAD, and OM1. CAD-RADS 4.  1. Left Main: FFRct 1.0   2. LAD: FFRct 0.95 proximal, 0.89 mid, 0.8 distal   3. LCX: FFRct 0.98 proximal, 0.98 mid, 0.77 distal.  OM1 FFRct 0.8   4. RCA: There are two small vessels arising form the RCA ostium. FFRct for RCA1 is 0.85. FFRct for RCA2 is 0.92  proximally and 0.69 mid.  He describes shortness of breath NYHA functional class II-3.  Sometimes he becomes short of breath getting dressed in the morning, but more commonly he has shortness of breath when walking, even on flat terrain.  He definitely develops dyspnea climbing stairs.  He habitually sleeps on 2 pillows and this has not changed.  He denies PND, orthopnea or lower extremity edema.  He also develops chest pain with more intense activity such as taking out the trash.  It is retrosternal and precordial and resolves within a few minutes of rest.  It has never been particularly severe and it has never occurred at rest.  He has chronic complaints of swelling and pain in the joints of his hands and his knees.  He has rheumatoid arthritis.  He was briefly hospitalized for chest and back pain in April 2022 when GI and cardiac enzymes were normal and he had a chest and abdominal CT due to the concern for possible aortic dissection.  The aorta is normal in caliber throughout.  There was no dissection.  There is only mild atherosclerotic plaque seen in the descending aorta.  There is also evidence of probably moderate calcification in the distribution of the coronary arteries, but with particular predilection for the proximal LAD artery territory.  He has normal renal function (creatinine 0.8) and a remarkably good lipid profile with an HDL of 72 and LDL of 74.  His electrocardiogram is normal.  His echocardiogram from May 17 shows normal LVEF and wall motion, mild LVH and grade 1 diastolic dysfunction, aortic valve sclerosis without stenosis.  His father reportedly had heart disease but lived to 67 years old.  His mother was similarly long lived to age 32, had hypertension.  He was seen in the remote past by Dr. Daneen Schick.  His last interaction with cardiology was with Joel Donaldson in September 2018.  In 2006 he had a normal nuclear stress test and echocardiogram.  Chest pain was also  complained at that time.  Past Medical History:  Diagnosis Date   Alcohol abuse    Anxiety    Arthritis    hands, back, knees   Asthma    Depression    GERD (gastroesophageal reflux disease)    Glaucoma    Heart murmur asymptomatic   Hx of radioisotope therapy 07/26/11   Radiosactive Prostate Seed Implant/ I-125 Seeds   Hypertension    Neck pain 10/22/2019   Prostate cancer ([place]) dx'd 2016 or 2017   seed implants   Rheumatoid arthritis ([place]) 10/10/2012    Past Surgical History:  Procedure Laterality Date   CYSTOSCOPY  09/14/2011   Procedure: CYSTOSCOPY FLEXIBLE;  Surgeon: Hanley Ben, MD;  Location: [place];  Service: Urology;  Laterality: N/A;  No seeds seen in the bladder   [place] IMPLANT  09/14/2011   Procedure: RADIOACTIVE SEED IMPLANT;  Surgeon: Hanley Ben, MD;  Location: [place];  Service: Urology;  Laterality: N/A;  Total number of seeds -     SHOULDER SURGERY  YRS AGO   LEFT    Current Medications: Current Meds  Medication Sig   acetaminophen (TYLENOL) 500 MG tablet Take 1,000 mg by mouth See admin instructions. Take 1,000 mg by mouth two to three times a day   albuterol (VENTOLIN HFA) 108 (90 Base) MCG/ACT inhaler Inhale 1-2 puffs into the lungs every 4 (four) hours as needed for wheezing or shortness of breath.   aspirin EC 81 MG tablet Take 81 mg by mouth every morning.    atorvastatin (LIPITOR) 20 MG tablet Take 1 tablet (20 mg total) by mouth daily.   buprenorphine (BUTRANS) 7.5 MCG/HR Place 1 patch onto the skin every Thursday.   diclofenac sodium (VOLTAREN) 1 % GEL APPLY 2 GRAMS EXTERNALLY TO THE AFFECTED AREA FOUR TIMES DAILY AS NEEDED FOR PAIN (Patient taking differently: Apply 2 g topically See admin instructions. Apply 2 grams topically to bilateral knees one to four times a day for pain)   fluticasone (FLONASE) 50 MCG/ACT nasal spray SHAKE LIQUID AND USE 2 SPRAYS IN EACH NOSTRIL DAILY    lidocaine (XYLOCAINE) 5 % ointment Apply topically as needed.   lisinopril (ZESTRIL) 20 MG tablet Take 1 tablet (20 mg total) by mouth daily. TAKE 1 TABLET(20 MG) BY MOUTH DAILY   metoprolol succinate (TOPROL-XL) 25 MG 24 hr tablet TAKE 1 TABLET(25 MG) BY MOUTH DAILY   Multiple Vitamins-Minerals (ONE-A-DAY PROACTIVE 65+) TABS Take 1 tablet by mouth daily with breakfast.   omeprazole (PRILOSEC) 40 MG capsule Take 1 capsule (40 mg total) by mouth daily before breakfast.   umeclidinium bromide (INCRUSE ELLIPTA) 62.5 MCG/INH AEPB Inhale 1 puff into the lungs daily.     Allergies:   Tramadol and Pollen extract   Social History   Socioeconomic History   Marital status: Legally Separated    Spouse name: Deneise Lever  Number of children: 1   Years of education: 11   Highest education level: Not on file  Occupational History   Occupation: Retired    Fish farm manager: RETIRED  Tobacco Use   Smoking status: Former    Packs/day: 2.00    Years: 50.00    Pack years: 100.00    Types: Cigarettes    Start date: 02/12/1953    Quit date: 10/13/2004    Years since quitting: 15.9   Smokeless tobacco: Never   Tobacco comments:    Smoked 2 ppd  Substance and Sexual Activity   Alcohol use: Yes    Alcohol/week: 14.0 standard drinks    Types: 14 Shots of liquor per week    Comment: 1/2 pint of liquor daily   Drug use: No   Sexual activity: Not Currently  Other Topics Concern   Not on file  Social History Narrative   Patient lives with his granddaughter in [place].   Patient is currently legally separated.    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    Patient enjoys seeing friends, hunting, and fishing.          Social Determinants of Health   Financial Resource Strain: Low Risk    Difficulty of Paying Living Expenses: Not hard at all  Food Insecurity: No Food Insecurity   Worried About Charity fundraiser in the Last Year: Never true   [place] in the Last Year: Never  true  Transportation Needs: No Transportation Needs   Lack of Transportation (Medical): No   Lack of Transportation (Non-Medical): No  Physical Activity: Insufficiently Active   Days of Exercise per Week: 2 days   Minutes of Exercise per Session: 10 min  Stress: No Stress Concern Present   Feeling of Stress : Only a little  Social Connections: Moderately Isolated   Frequency of Communication with Friends and Family: Three times a week   Frequency of Social Gatherings with Friends and Family: More than three times a week   Attends Religious Services: More than 4 times per year   Active Member of [employer] or Organizations: No   Attends Archivist Meetings: Never   Marital Status: Separated     Family History: The patient's family history includes Diabetes in his mother; Heart disease in his father.  ROS:   Please see the history of present illness.     All other systems reviewed and are negative.  EKGs/Labs/Other Studies Reviewed:    The following studies were reviewed today: 09/05/2020 Left main: 0   Left anterior descending artery: 989   Left circumflex artery: 259   Right coronary artery: 276   Total: 1523   Percentile: 90th   Other findings:   Normal pulmonary vein drainage into the left atrium.   Normal let atrial appendage without a thrombus.   Normal size of the pulmonary artery.   IMPRESSION: 1. Coronary calcium score of 1523. This was 90th percentile for age-, race-, and sex-matched controls.   2. Normal coronary origin with left dominance.   3. There is severe (>70%) plaque in the distal LCX, and mild (25-49% stenosis in RCA, LAD, and OM1. CAD-RADS 4. 1. Left Main: FFRct 1.0   2. LAD: FFRct 0.95 proximal, 0.89 mid, 0.8 distal   3. LCX: FFRct 0.98 proximal, 0.98 mid, 0.77 distal.  OM1 FFRct 0.8   4. RCA: There are two small vessels arising form the RCA ostium. FFRct for RCA1 is 0.85. FFRct for  RCA2 is 0.92 proximally and  0.69 mid.   Echocardiogram 06/28/2020   1. Left ventricular ejection fraction, by estimation, is 55 to 60%. The  left ventricle has normal function. The left ventricle has no regional  wall motion abnormalities. There is mild left ventricular hypertrophy.  Left ventricular diastolic parameters  are consistent with Grade I diastolic dysfunction (impaired relaxation).   2. Right ventricular systolic function is normal. The right ventricular  size is normal.   3. The mitral valve is normal in structure. No evidence of mitral valve  regurgitation.   4. The aortic valve is tricuspid. Aortic valve regurgitation is mild.  Mild to moderate aortic valve sclerosis/calcification is present, without  any evidence of aortic stenosis.    EKG:  EKG is  ordered today.  The ekg ordered today demonstrates sinus rhythm, normal tracing except for borderline QTC 469 ms  Recent Labs: 12/14/2019: BNP 106.9 05/20/2020: ALT 32 05/21/2020: Hemoglobin 13.0; Platelets 149 08/30/2020: BUN 10; Creatinine, Ser 0.81; Potassium 4.7; Sodium 140  Recent Lipid Panel    Component Value Date/Time   CHOL 156 05/21/2020 0130   CHOL 170 07/30/2017 1425   TRIG 50 05/21/2020 0130   HDL 72 05/21/2020 0130   HDL 43 07/30/2017 1425   CHOLHDL 2.2 05/21/2020 0130   VLDL 10 05/21/2020 0130   LDLCALC 74 05/21/2020 0130   LDLCALC 80 07/30/2017 1425   LDLDIRECT 99 06/24/2007 2025     Risk Assessment/Calculations:           Physical Exam:    VS:  BP (!) 142/84 (BP Location: Left Arm, Patient Position: Sitting, Cuff Size: Normal)   Pulse 78   Ht '5\' 10"'$  (1.778 m)   Wt 171 lb 12.8 oz (77.9 kg)   SpO2 99%   BMI 24.65 kg/m     Wt Readings from Last 3 Encounters:  09/12/20 171 lb 12.8 oz (77.9 kg)  08/24/20 179 lb (81.2 kg)  08/17/20 181 lb 8 oz (82.3 kg)      General: Alert, oriented x3, no distress Head: no evidence of trauma, PERRL, EOMI, no exophtalmos or lid lag, no myxedema, no xanthelasma; normal ears, nose  and oropharynx Neck: normal jugular venous pulsations and no hepatojugular reflux; brisk carotid pulses without delay and no carotid bruits Chest: clear to auscultation, no signs of consolidation by percussion or palpation, normal fremitus, symmetrical and full respiratory excursions Cardiovascular: normal position and quality of the apical impulse, regular rhythm, normal first and second heart sounds, no murmurs, rubs or gallops Abdomen: no tenderness or distention, no masses by palpation, no abnormal pulsatility or arterial bruits, normal bowel sounds, no hepatosplenomegaly Extremities: no clubbing, cyanosis or edema; 2+ radial, ulnar and brachial pulses bilaterally; 2+ right femoral, posterior tibial and dorsalis pedis pulses; 2+ left femoral, posterior tibial and dorsalis pedis pulses; no subclavian or femoral bruits Neurological: grossly nonfocal Psych: Normal mood and affect   ASSESSMENT:    1. Coronary artery disease involving coronary bypass graft of native heart with unstable angina pectoris ([place])   2. Pre-procedure lab exam   3. Essential hypertension     PLAN:    In order of problems listed above:  CAD/angina/dyspnea on exertion: On beta blocker and aspirin 81 mg daily.  Start atorvastatin 20 mg daily (he has a remarkable severity of plaque burden despite a pretty good lipid profile).  We will schedule him for invasive angiography since there is a discrepancy between his symptoms and the widespread atherosclerotic plaque, but with  unimpressive FFR values. The diagnostic cath procedure and possible PCI/stent has been fully reviewed with the patient and written informed consent has been obtained. HTN: Usually well controlled.  Hold the ACE inhibitor on the day of his procedure.          Medication Adjustments/Labs and Tests Ordered: Current medicines are reviewed at length with the patient today.  Concerns regarding medicines are outlined above.  Orders Placed This Encounter   Procedures   Basic metabolic panel   CBC   EKG 12-Lead    Meds ordered this encounter  Medications   atorvastatin (LIPITOR) 20 MG tablet    Sig: Take 1 tablet (20 mg total) by mouth daily.    Dispense:  90 tablet    Refill:  3     Patient Instructions  Medication Instructions:  START Atorvastatin 20 mg once daily *If you need a refill on your cardiac medications before your next appointment, please call your pharmacy*   Lab Work: Your provider would like for you to have the following labs today: CBC and BMET  If you have labs (blood work) drawn today and your tests are completely normal, you will receive your results only by: MyChart Message (if you have MyChart) OR A paper copy in the mail If you have any lab test that is abnormal or we need to change your treatment, we will call you to review the results.   Testing/Procedures: Your physician has requested that you have a cardiac catheterization. Cardiac catheterization is used to diagnose and/or treat various heart conditions. Doctors may recommend this procedure for a number of different reasons. The most common reason is to evaluate chest pain. Chest pain can be a symptom of coronary artery disease (CAD), and cardiac catheterization can show whether plaque is narrowing or blocking your heart's arteries. This procedure is also used to evaluate the valves, as well as measure the blood flow and oxygen levels in different parts of your heart. For further information please visit [email or web address]. Please follow instruction sheet, as given.   Follow-Up: At [facility], you and your health needs are our priority.  As part of our continuing mission to provide you with exceptional heart care, we have created designated Provider Care Teams.  These Care Teams include your primary Cardiologist (physician) and Advanced Practice Providers (APPs -  Physician Assistants and Nurse Practitioners) who all work together to provide you with  the care you need, when you need it.  We recommend signing up for the patient portal called "MyChart".  Sign up information is provided on this After Visit Summary.  MyChart is used to connect with patients for Virtual Visits (Telemedicine).  Patients are able to view lab/test results, encounter notes, upcoming appointments, etc.  Non-urgent messages can be sent to your provider as well.   To learn more about what you can do with MyChart, go to [email or web address].    Your next appointment:   Follow up in 2-3 weeks with an APP Follow up in 3-4 months with Dr. Sallyanne Kuster  Other Instructions  [facility] CARDIOVASCULAR DIVISION [facility] [place] [address] Dept: [phone] Loc: [place]  09/12/2020  You are scheduled for a Cardiac Catheterization on Wednesday, August 3 with Dr. Ellyn Hack  1. Please arrive at the [facility] (Main Entrance A) at [facility]: [address] [place], [place] [address] at 8:30 (This time is two hours before your procedure to ensure  your preparation). Free valet parking service is available.   Special note: Every effort is made to have your procedure done on time. Please understand that emergencies sometimes delay scheduled procedures.  2. Diet: Do not eat solid foods after midnight.  The patient may have clear liquids until 5am upon the day of the procedure.  3. Labs: You will need to have blood drawn on 09/12/20  4. Medication instructions in preparation for your procedure: Nothing to hold   On the morning of your procedure, take your Aspirin and any morning medicines NOT listed above.  You may use sips of water.  5. Plan for one night stay--bring personal belongings. 6. Bring a current list of your medications and current insurance cards. 7. You MUST have a responsible person to drive you home. 8. Someone MUST be with you the first 24 hours after you  arrive home or your discharge will be delayed. 9. Please wear clothes that are easy to get on and off and wear slip-on shoes.  Thank you for allowing [place] to care for you!   -- [facility]   Signed, Sanda Klein, MD  09/12/2020 3:20 PM    [place]

## 2020-09-13 LAB — BASIC METABOLIC PANEL
BUN/Creatinine Ratio: 14 (ref 10–24)
BUN: 10 mg/dL (ref 8–27)
CO2: 24 mmol/L (ref 20–29)
Calcium: 9.4 mg/dL (ref 8.6–10.2)
Chloride: 108 mmol/L — ABNORMAL HIGH (ref 96–106)
Creatinine, Ser: 0.73 mg/dL — ABNORMAL LOW (ref 0.76–1.27)
Glucose: 94 mg/dL (ref 65–99)
Potassium: 4.8 mmol/L (ref 3.5–5.2)
Sodium: 144 mmol/L (ref 134–144)
eGFR: 91 mL/min/{1.73_m2} (ref >59)

## 2020-09-13 LAB — CBC
Hematocrit: 40.9 % (ref 37.5–51.0)
Hemoglobin: 12.9 g/dL — ABNORMAL LOW (ref 13.0–17.7)
MCH: 26.9 pg (ref 26.6–33.0)
MCHC: 31.5 g/dL (ref 31.5–35.7)
MCV: 85 fL (ref 79–97)
Platelets: 245 10*3/uL (ref 150–450)
RBC: 4.8 x10E6/uL (ref 4.14–5.80)
RDW: 12.7 % (ref 11.6–15.4)
WBC: 3.9 10*3/uL (ref 3.4–10.8)

## 2020-09-15 ENCOUNTER — Telehealth: Payer: Self-pay | Admitting: *Deleted

## 2020-09-15 NOTE — Telephone Encounter (Signed)
Cardiac catheterization scheduled at [facility] for: Friday September 16, 2020 10:30 AM [facility] Main Entrance A [facility]) at: 8:30 AM   No solid food after midnight prior to cath, clear liquids until 5 AM day of procedure.    AM meds can be  taken pre-cath with sips of water including:   aspirin 81 mg   Confirmed patient has responsible adult to drive home post procedure and be with patient first 24 hours after arriving home.  Patients are allowed one visitor in the waiting room during the time they are at the hospital for their procedure. Both patient and visitor must wear a mask once they enter the hospital.   Patient reports does not currently have any symptoms concerning for COVID-19 and no household members with COVID-19 like illness.      Call placed to patient to review procedure instructions, voice mail message, mailbox full, unable to leave message.

## 2020-09-15 NOTE — Telephone Encounter (Signed)
Call placed to patient to review procedure instructions, no answer, no voicemail.

## 2020-09-16 ENCOUNTER — Other Ambulatory Visit: Payer: Self-pay

## 2020-09-16 ENCOUNTER — Encounter (HOSPITAL_COMMUNITY)
Admission: RE | Disposition: A | Discharge status: Home / Self Care | Payer: Self-pay | Source: Home / Self Care | Attending: Cardiology

## 2020-09-16 ENCOUNTER — Ambulatory Visit (HOSPITAL_COMMUNITY)
Admission: RE | Admit: 2020-09-16 | Discharge: 2020-09-16 | Disposition: A | Discharge status: Home / Self Care | Payer: Medicare Other | Attending: Cardiology | Admitting: Cardiology

## 2020-09-16 DIAGNOSIS — F17210 Nicotine dependence, cigarettes, uncomplicated: Secondary | ICD-10-CM | POA: Diagnosis not present

## 2020-09-16 DIAGNOSIS — I209 Angina pectoris, unspecified: Secondary | ICD-10-CM | POA: Diagnosis present

## 2020-09-16 DIAGNOSIS — H409 Unspecified glaucoma: Secondary | ICD-10-CM | POA: Diagnosis not present

## 2020-09-16 DIAGNOSIS — Z7982 Long term (current) use of aspirin: Secondary | ICD-10-CM | POA: Insufficient documentation

## 2020-09-16 DIAGNOSIS — Z885 Allergy status to narcotic agent status: Secondary | ICD-10-CM | POA: Diagnosis not present

## 2020-09-16 DIAGNOSIS — I10 Essential (primary) hypertension: Secondary | ICD-10-CM | POA: Insufficient documentation

## 2020-09-16 DIAGNOSIS — I208 Other forms of angina pectoris: Secondary | ICD-10-CM | POA: Clinically undetermined

## 2020-09-16 DIAGNOSIS — I25119 Atherosclerotic heart disease of native coronary artery with unspecified angina pectoris: Secondary | ICD-10-CM

## 2020-09-16 DIAGNOSIS — Z8546 Personal history of malignant neoplasm of prostate: Secondary | ICD-10-CM | POA: Insufficient documentation

## 2020-09-16 DIAGNOSIS — Z79899 Other long term (current) drug therapy: Secondary | ICD-10-CM | POA: Diagnosis not present

## 2020-09-16 DIAGNOSIS — Z8249 Family history of ischemic heart disease and other diseases of the circulatory system: Secondary | ICD-10-CM | POA: Diagnosis not present

## 2020-09-16 DIAGNOSIS — R931 Abnormal findings on diagnostic imaging of heart and coronary circulation: Secondary | ICD-10-CM | POA: Diagnosis present

## 2020-09-16 DIAGNOSIS — I2089 Other forms of angina pectoris: Secondary | ICD-10-CM | POA: Clinically undetermined

## 2020-09-16 DIAGNOSIS — Z006 Encounter for examination for normal comparison and control in clinical research program: Secondary | ICD-10-CM

## 2020-09-16 HISTORY — PX: LEFT HEART CATH AND CORONARY ANGIOGRAPHY: CATH118249

## 2020-09-16 HISTORY — DX: Abnormal findings on diagnostic imaging of heart and coronary circulation: R93.1

## 2020-09-16 SURGERY — LEFT HEART CATH AND CORONARY ANGIOGRAPHY
Anesthesia: LOCAL

## 2020-09-16 MED ORDER — MIDAZOLAM HCL 2 MG/2ML IJ SOLN
INTRAMUSCULAR | Status: DC | PRN
  Administered 2020-09-16: 2 mg via INTRAVENOUS

## 2020-09-16 MED ORDER — ASPIRIN 81 MG PO CHEW: 81.0000 mg | CHEWABLE_TABLET | ORAL | Status: DC

## 2020-09-16 MED ORDER — HEPARIN SODIUM (PORCINE) 1000 UNIT/ML IJ SOLN
INTRAMUSCULAR | Status: AC
  Filled 2020-09-16: qty 1

## 2020-09-16 MED ORDER — HEPARIN SODIUM (PORCINE) 1000 UNIT/ML IJ SOLN
INTRAMUSCULAR | Status: DC | PRN
  Administered 2020-09-16: 4000 [IU] via INTRAVENOUS

## 2020-09-16 MED ORDER — LIDOCAINE HCL (PF) 1 % IJ SOLN
INTRAMUSCULAR | Status: AC
  Filled 2020-09-16: qty 30

## 2020-09-16 MED ORDER — VERAPAMIL HCL 2.5 MG/ML IV SOLN
INTRAVENOUS | Status: DC | PRN
  Administered 2020-09-16: 10 mL via INTRA_ARTERIAL

## 2020-09-16 MED ORDER — HYDRALAZINE HCL 20 MG/ML IJ SOLN: 10.0000 mg | INTRAMUSCULAR | Status: DC | PRN

## 2020-09-16 MED ORDER — SODIUM CHLORIDE 0.9% FLUSH: 3.0000 mL | Freq: Two times a day (BID) | INTRAVENOUS | Status: DC

## 2020-09-16 MED ORDER — SODIUM CHLORIDE 0.9 % IV SOLN: INTRAVENOUS | Status: DC

## 2020-09-16 MED ORDER — LIDOCAINE HCL (PF) 1 % IJ SOLN
INTRAMUSCULAR | Status: DC | PRN
  Administered 2020-09-16: 2 mL

## 2020-09-16 MED ORDER — SODIUM CHLORIDE 0.9% FLUSH: 3.0000 mL | INTRAVENOUS | Status: DC | PRN

## 2020-09-16 MED ORDER — VERAPAMIL HCL 2.5 MG/ML IV SOLN
INTRAVENOUS | Status: AC
  Filled 2020-09-16: qty 2

## 2020-09-16 MED ORDER — HEPARIN (PORCINE) IN NACL 1000-0.9 UT/500ML-% IV SOLN
INTRAVENOUS | Status: DC | PRN
  Administered 2020-09-16 (×3): 500 mL

## 2020-09-16 MED ORDER — ONDANSETRON HCL 4 MG/2ML IJ SOLN: 4.0000 mg | Freq: Four times a day (QID) | INTRAMUSCULAR | Status: DC | PRN

## 2020-09-16 MED ORDER — MIDAZOLAM HCL 2 MG/2ML IJ SOLN
INTRAMUSCULAR | Status: AC
  Filled 2020-09-16: qty 2

## 2020-09-16 MED ORDER — HEPARIN (PORCINE) IN NACL 1000-0.9 UT/500ML-% IV SOLN
INTRAVENOUS | Status: AC
  Filled 2020-09-16: qty 500

## 2020-09-16 MED ORDER — FENTANYL CITRATE (PF) 100 MCG/2ML IJ SOLN
INTRAMUSCULAR | Status: AC
  Filled 2020-09-16: qty 2

## 2020-09-16 MED ORDER — LABETALOL HCL 5 MG/ML IV SOLN: 10.0000 mg | INTRAVENOUS | Status: DC | PRN

## 2020-09-16 MED ORDER — SODIUM CHLORIDE 0.9 % WEIGHT BASED INFUSION: 1.0000 mL/kg/h | INTRAVENOUS | Status: DC

## 2020-09-16 MED ORDER — IOHEXOL 350 MG/ML SOLN
INTRAVENOUS | Status: DC | PRN
  Administered 2020-09-16: 75 mL via INTRA_ARTERIAL

## 2020-09-16 MED ORDER — SODIUM CHLORIDE 0.9 % IV SOLN: 250.0000 mL | INTRAVENOUS | Status: DC | PRN

## 2020-09-16 MED ORDER — ACETAMINOPHEN 325 MG PO TABS: 650.0000 mg | ORAL_TABLET | ORAL | Status: DC | PRN

## 2020-09-16 MED ORDER — FENTANYL CITRATE (PF) 100 MCG/2ML IJ SOLN
INTRAMUSCULAR | Status: DC | PRN
  Administered 2020-09-16: 25 ug via INTRAVENOUS

## 2020-09-16 MED ORDER — SODIUM CHLORIDE 0.9 % WEIGHT BASED INFUSION
3.0000 mL/kg/h | INTRAVENOUS | Status: AC
  Administered 2020-09-16: 3 mL/kg/h via INTRAVENOUS

## 2020-09-16 SURGICAL SUPPLY — 9 items
CATH OPTITORQUE TIG 4.0 5F (CATHETERS) ×1 IMPLANT
DEVICE RAD COMP TR BAND LRG (VASCULAR PRODUCTS) ×1 IMPLANT
GLIDESHEATH SLEND SS 6F .021 (SHEATH) ×1 IMPLANT
GUIDEWIRE INQWIRE 1.5J.035X260 (WIRE) IMPLANT
INQWIRE 1.5J .035X260CM (WIRE) ×2
KIT HEART LEFT (KITS) ×2 IMPLANT
PACK CARDIAC CATHETERIZATION (CUSTOM PROCEDURE TRAY) ×2 IMPLANT
TRANSDUCER W/STOPCOCK (MISCELLANEOUS) ×2 IMPLANT
TUBING CIL FLEX 10 FLL-RA (TUBING) ×2 IMPLANT

## 2020-09-16 NOTE — Interval H&P Note (Signed)
History and Physical Interval Note:  09/16/2020 1:42 PM  Joel Donaldson  has presented today for surgery, with the diagnosis of abnormal ct - chest pain.  The various methods of treatment have been discussed with the patient and family. After consideration of risks, benefits and other options for treatment, the patient has consented to  Procedure(s): LEFT HEART CATH AND CORONARY ANGIOGRAPHY (N/A)  PERCUTANEOUS CORONARY INTERVENTION  as a surgical intervention.  The patient's history has been reviewed, patient examined, no change in status, stable for surgery.  I have reviewed the patient's chart and labs.  Questions were answered to the patient's satisfaction.    Cath Lab Visit (complete for each Cath Lab visit)  Clinical Evaluation Leading to the Procedure:   ACS: No.  Non-ACS:    Anginal Classification: CCS II-III  Anti-ischemic medical therapy: Minimal Therapy (1 class of medications)  Non-Invasive Test Results: High-risk stress test findings: cardiac mortality >3%/year  Prior CABG: No previous CABG   Joel Donaldson

## 2020-09-16 NOTE — Research (Signed)
IDENTIFY Informed Consent                  Subject Name: Joel Donaldson   Subject met inclusion and exclusion criteria.  The informed consent form, study requirements and expectations were reviewed with the subject and questions and concerns were addressed prior to the signing of the consent form.  The subject verbalized understanding of the trial requirements.  The subject agreed to participate in the IDENTIFY trial and signed the informed consent at 09:23AM on 09/16/20.  The informed consent was obtained prior to performance of any protocol-specific procedures for the subject.  A copy of the signed informed consent was given to the subject and a copy was placed in the subject's medical record.    Ledon Snare , Research Assistant

## 2020-09-16 NOTE — Progress Notes (Signed)
While sitting with pt he was on regular monitoring, thumb pleth went to a heart rate of 258 and 158. Pt was not moving hand and was asymptomatic. I called card PA Vin, pt was placed on central monitoring and I will contact Vin PA with any other symptoms.

## 2020-09-16 NOTE — Progress Notes (Signed)
Pt ambulated without difficulty or bleeding.   Discharged home with his cousin who will drive and stay with pt x 24 hrs.

## 2020-09-19 ENCOUNTER — Encounter (HOSPITAL_COMMUNITY): Payer: Self-pay | Admitting: Cardiology

## 2020-09-30 ENCOUNTER — Ambulatory Visit: Payer: Medicare Other | Admitting: Family Medicine

## 2020-10-03 ENCOUNTER — Ambulatory Visit (INDEPENDENT_AMBULATORY_CARE_PROVIDER_SITE_OTHER): Payer: Medicare Other | Admitting: Family Medicine

## 2020-10-03 ENCOUNTER — Encounter: Payer: Self-pay | Admitting: Family Medicine

## 2020-10-03 ENCOUNTER — Other Ambulatory Visit: Payer: Self-pay

## 2020-10-03 VITALS — BP 166/94 | HR 84 | Ht 70.0 in | Wt 176.8 lb

## 2020-10-03 DIAGNOSIS — I10 Essential (primary) hypertension: Secondary | ICD-10-CM | POA: Diagnosis not present

## 2020-10-03 DIAGNOSIS — R079 Chest pain, unspecified: Secondary | ICD-10-CM | POA: Diagnosis not present

## 2020-10-03 MED ORDER — LISINOPRIL 20 MG PO TABS: 40.0000 mg | ORAL_TABLET | Freq: Every day | ORAL | 0 refills | Status: DC

## 2020-10-03 NOTE — Assessment & Plan Note (Addendum)
BP elevated 166/94, 162/82 on recheck.  Continues on lisinopril 20 mg daily.  Will increase the lisinopril to 40 mg daily.  We will check a BMP today for renal function.  Patient checks blood pressures at home at least twice per day for the next week and bring these to follow-up in 1 week to recheck BMP for renal function and potassium as well as recheck blood pressure.

## 2020-10-03 NOTE — Patient Instructions (Signed)
It was great to see you! Thank you for allowing me to participate in your care!  I recommend that you always bring your medications to each appointment as this makes it easy to ensure we are on the correct medications and helps [place] not miss when refills are needed.  Our plans for today:  -I would like you to check your blood pressures at home at least twice per day and write these numbers down.  We are going to increase your lisinopril to 40 mg/day.  We are going to check some labs today and I would like for you to follow-up in 1 week for a recheck of your blood pressure and recheck labs. -If you develop any crushing chest pains on should immediately call 911 -Please keep your appointment with cardiology in 2 days -If the nodule under your right breast does not improve, or if it worsens I would like for you to follow back up with [place] in the next 1 to 2 weeks.  We are checking some labs today, I will call you if they are abnormal will send you a MyChart message or a letter if they are normal.  If you do not hear about your labs in the next 2 weeks please let us know.  Take care and seek immediate care sooner if you develop any concerns.   Dr. Lurline Del, [place]

## 2020-10-03 NOTE — Progress Notes (Signed)
    SUBJECTIVE:   CHIEF COMPLAINT / HPI:   Hospital follow-up -chest pain/dyspnea: Patient presenting for follow-up after his diagnostic cath which was ordered due to his chest pain.  Heart cath showed mild diffuse multivessel disease with no obvious significant stenosis.  He has follow-up scheduled with cardiology on the 24th, in 2 more days.  Patient states today that he still has the chest pain that sometimes happens with activity and sometimes not. No chest pain currently. It is mostly in his left chest but does not get worse with palpation.  Nodule under right nipple Noticed it about the last week. Is painful to press on. Does not think he had it before a week or so ago. No family history of males having breast cancer.  Thinks it came up during his recent hospitalization.  PERTINENT  PMH / PSH: History of hypertension  OBJECTIVE:   BP (!) 166/94   Pulse 84   Ht '5\' 10"'$  (1.778 m)   Wt 176 lb 12.8 oz (80.2 kg)   SpO2 100%   BMI 25.37 kg/m    BP recheck 162/82  General: NAD, pleasant, able to participate in exam Cardiac: RRR, no murmurs.  Approximately 1 cm nodularity noted under right nipple that is painful to palpation.  No overlying skin changes Respiratory: CTAB, normal effort Abdomen: Bowel sounds present, nontender, nondistended, no hepatosplenomegaly. Psych: Normal affect and mood   ASSESSMENT/PLAN:   Essential hypertension BP elevated 166/94, 162/82 on recheck.  Continues on lisinopril 20 mg daily.  Will increase the lisinopril to 40 mg daily.  We will check a BMP today for renal function.  Patient's mother checks blood pressures at home at least twice per day for the next week and bring these to follow-up in 1 week to recheck BMP for renal function and potassium as well as recheck blood pressure.   Chest pain: No chest pain today.  Had a cardiac cath which did not require any intervention.  Has follow-up with cardiology in 2 days.  Discussed return precautions  including going to the emergency department if he gets any crushing chest pains or dyspnea that does not resolve with his nitroglycerin.  Right breast pain: Patient with approximately 1 cm nodularity underneath the nipple of his right breast which he thinks has been there for about a week.  He does not have any family history of males with breast cancer.  Overall differential can include a cyst versus soft tissue injury versus early gynecomastia.  Low concern for malignancy in a male who just recently had a CT scan of his chest did not show any obvious abnormality there.  Recommend that he monitor it for the next 1 week and if it continues or worsens he will follow-up when he follows up for blood pressure.  Lurline Del, [place]

## 2020-10-04 ENCOUNTER — Encounter: Payer: Self-pay | Admitting: Family Medicine

## 2020-10-04 LAB — BASIC METABOLIC PANEL
BUN/Creatinine Ratio: 14 (ref 10–24)
BUN: 11 mg/dL (ref 8–27)
CO2: 21 mmol/L (ref 20–29)
Calcium: 9.1 mg/dL (ref 8.6–10.2)
Chloride: 107 mmol/L — ABNORMAL HIGH (ref 96–106)
Creatinine, Ser: 0.81 mg/dL (ref 0.76–1.27)
Glucose: 87 mg/dL (ref 65–99)
Potassium: 3.9 mmol/L (ref 3.5–5.2)
Sodium: 142 mmol/L (ref 134–144)
eGFR: 89 mL/min/{1.73_m2} (ref >59)

## 2020-10-05 ENCOUNTER — Encounter: Payer: Self-pay | Admitting: Cardiovascular Disease

## 2020-10-05 ENCOUNTER — Other Ambulatory Visit: Payer: Self-pay

## 2020-10-05 ENCOUNTER — Ambulatory Visit (INDEPENDENT_AMBULATORY_CARE_PROVIDER_SITE_OTHER): Payer: Medicare Other | Admitting: Cardiovascular Disease

## 2020-10-05 VITALS — BP 164/76 | HR 84 | Ht 70.0 in | Wt 177.0 lb

## 2020-10-05 DIAGNOSIS — I25110 Atherosclerotic heart disease of native coronary artery with unstable angina pectoris: Secondary | ICD-10-CM

## 2020-10-05 DIAGNOSIS — I10 Essential (primary) hypertension: Secondary | ICD-10-CM

## 2020-10-05 MED ORDER — METOPROLOL SUCCINATE ER 50 MG PO TB24: 50.0000 mg | ORAL_TABLET | Freq: Every day | ORAL | 3 refills | Status: DC

## 2020-10-05 NOTE — Progress Notes (Signed)
Cardiology Office Note:    Date:  10/06/2020   ID:  Joel Donaldson, DOB 02/01/1941, MRN [id number]  PCP:  [name], DO   [facility] Providers Cardiologist:  Sanda Klein, MD     Referring MD: [name], DO   Chief Complaint  Patient presents with   Follow-up cardiac catheterization    History of Present Illness:    Joel Donaldson is a 80 y.o. male with a hx of rheumatoid arthritis and prostate cancer treated with radioactive seed implantation, who has been experiencing chest discomfort and dyspnea over the last few months.  He has a history of hypertension with onset roughly 10 years ago.  He quit smoking about 10 years ago.  He does not have diabetes mellitus or hypercholesterolemia.  He just under coronary CT angiogram which showed a remarkably severe coronary atherosclerosis.  The calcium score placing him in the 90th percentile for age/gender.  There was particularly severe calcium buildup in the LAD artery.  By morphological criteria, there was severe stenosis in the distal left circumflex coronary artery where there was a severe stenosis with mild stenoses in the right coronary artery, proximal-mid LAD and OM1.  However by FFR, significant reduction in flow was seen in all 3 major coronary territories.  Due to ongoing symptoms underwent cardiac catheterization which essentially confirmed the findings on CT angiography.  The most significant lesion was 60% stenosis in the AV groove portion of the left circumflex coronary artery without evidence of flow-limiting hemodynamics by CT FFR.  He had normal left ventricular filling pressures.  Cardiac cath 09/16/2020   LPAV lesion is 60% stenosed.   Dist LM to Ost LAD lesion is 30% stenosed.   The left ventricular systolic function is normal.   SUMMARY Mild diffuse multivessel disease with no obvious significant stenosis.   Most notable stenosis is in the distal AV groove LCx roughly 60% (not positive by CT  FFR). Significant systemic hypertension with normal LV EDP. Normal LV function with no obvious wall motion abnormality Suspect MICROVASCULAR DISEASE - ([place])  1. Coronary calcium score of 1523. This was 90th percentile for age-, race-, and sex-matched controls.   2. Normal coronary origin with left dominance.   3. There is severe (>70%) plaque in the distal LCX, and mild (25-49% stenosis in RCA, LAD, and OM1. CAD-RADS 4.  1. Left Main: FFRct 1.0   2. LAD: FFRct 0.95 proximal, 0.89 mid, 0.8 distal   3. LCX: FFRct 0.98 proximal, 0.98 mid, 0.77 distal.  OM1 FFRct 0.8   4. RCA: There are two small vessels arising form the RCA ostium. FFRct for RCA1 is 0.85. FFRct for RCA2 is 0.92 proximally and 0.69 mid.  He continues to have mild chest discomfort with moderate activity.  He has NYHA functional class II exertional dyspnea.  His complaints are those related to rheumatoid arthritis involving his hands and knees.  Denies edema, orthopnea, PND, claudication, focal neurological complaints.  He has normal renal function (creatinine 0.8) and a remarkably good lipid profile with an HDL of 72 and LDL of 74.  His electrocardiogram is normal.  His echocardiogram from May 17 shows normal LVEF and wall motion, mild LVH and grade 1 diastolic dysfunction, aortic valve sclerosis without stenosis.  His father reportedly had heart disease but lived to 7 years old.  His mother was similarly long lived to age 14, had hypertension.   Past Medical History:  Diagnosis Date   Alcohol abuse    Anxiety  Arthritis    hands, back, knees   Asthma    Depression    GERD (gastroesophageal reflux disease)    Glaucoma    Heart murmur asymptomatic   Hx of radioisotope therapy 07/26/11   Radiosactive Prostate Seed Implant/ I-125 Seeds   Hypertension    Neck pain 10/22/2019   Prostate cancer ([place]) dx'd 2016 or 2017   seed implants   Rheumatoid arthritis ([place]) 10/10/2012    Past Surgical History:   Procedure Laterality Date   CYSTOSCOPY  09/14/2011   Procedure: CYSTOSCOPY FLEXIBLE;  Surgeon: Hanley Ben, MD;  Location: [facility];  Service: Urology;  Laterality: N/A;  No seeds seen in the bladder   LEFT HEART CATH AND CORONARY ANGIOGRAPHY N/A 09/16/2020   Procedure: LEFT HEART CATH AND CORONARY ANGIOGRAPHY;  Surgeon: Leonie Man, MD;  Location: [facility];  Service: Cardiovascular;  Laterality: N/A;   [place] IMPLANT  09/14/2011   Procedure: RADIOACTIVE SEED IMPLANT;  Surgeon: Hanley Ben, MD;  Location: [facility];  Service: Urology;  Laterality: N/A;  Total number of seeds -     SHOULDER SURGERY  YRS AGO   LEFT    Current Medications: Current Meds  Medication Sig   acetaminophen (TYLENOL) 500 MG tablet Take 1,000 mg by mouth every 8 (eight) hours as needed for mild pain or moderate pain.   albuterol (VENTOLIN HFA) 108 (90 Base) MCG/ACT inhaler Inhale 1-2 puffs into the lungs every 4 (four) hours as needed for wheezing or shortness of breath.   aspirin EC 81 MG tablet Take 81 mg by mouth every morning.    atorvastatin (LIPITOR) 20 MG tablet Take 1 tablet (20 mg total) by mouth daily.   buprenorphine (BUTRANS) 7.5 MCG/HR Place 1 patch onto the skin every Thursday.   diclofenac sodium (VOLTAREN) 1 % GEL APPLY 2 GRAMS EXTERNALLY TO THE AFFECTED AREA FOUR TIMES DAILY AS NEEDED FOR PAIN (Patient taking differently: Apply 2 g topically See admin instructions. Apply 2 grams topically to bilateral knees one to four times a day for pain)   fluticasone (FLONASE) 50 MCG/ACT nasal spray SHAKE LIQUID AND USE 2 SPRAYS IN EACH NOSTRIL DAILY   lidocaine (XYLOCAINE) 5 % ointment Apply 1 application topically daily as needed for mild pain or moderate pain.   lisinopril (ZESTRIL) 20 MG tablet Take 2 tablets (40 mg total) by mouth daily. TAKE 1 TABLET(20 MG) BY MOUTH DAILY   metoprolol succinate (TOPROL-XL) 50 MG 24 hr tablet  Take 1 tablet (50 mg total) by mouth daily. Take with or immediately following a meal.   Multiple Vitamins-Minerals (ONE-A-DAY PROACTIVE 65+) TABS Take 1 tablet by mouth daily with breakfast.   nitroGLYCERIN (NITROSTAT) 0.4 MG SL tablet Place 1 tablet (0.4 mg total) under the tongue every 5 (five) minutes as needed for chest pain (chest pain).   omeprazole (PRILOSEC) 40 MG capsule Take 1 capsule (40 mg total) by mouth daily before breakfast.   oxyCODONE-acetaminophen (PERCOCET) 10-325 MG tablet Take 1 tablet by mouth every 12 (twelve) hours as needed for pain. (Patient taking differently: Take 1 tablet by mouth 3 (three) times daily as needed for pain.)   polyethylene glycol (MIRALAX / GLYCOLAX) packet Take 17 g by mouth daily as needed for mild constipation.   umeclidinium bromide (INCRUSE ELLIPTA) 62.5 MCG/INH AEPB Inhale 1 puff into the lungs daily.   [DISCONTINUED] metoprolol succinate (TOPROL-XL) 25 MG 24 hr tablet TAKE 1 TABLET(25  MG) BY MOUTH DAILY (Patient taking differently: Take 25 mg by mouth daily.)     Allergies:   Tramadol and Pollen extract   Social History   Socioeconomic History   Marital status: Legally Separated    Spouse name: Deneise Lever   Number of children: 1   Years of education: 11   Highest education level: Not on file  Occupational History   Occupation: Retired    Fish farm manager: RETIRED  Tobacco Use   Smoking status: Former    Packs/day: 2.00    Years: 50.00    Pack years: 100.00    Types: Cigarettes    Start date: 02/12/1953    Quit date: 10/13/2004    Years since quitting: 15.9   Smokeless tobacco: Never   Tobacco comments:    Smoked 2 ppd  Substance and Sexual Activity   Alcohol use: Yes    Alcohol/week: 14.0 standard drinks    Types: 14 Shots of liquor per week    Comment: 1/2 pint of liquor daily   Drug use: No   Sexual activity: Not Currently  Other Topics Concern   Not on file  Social History Narrative   Patient lives with his granddaughter in  [place].   Patient is currently legally separated.    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    Patient enjoys seeing friends, hunting, and fishing.          Social Determinants of Health   Financial Resource Strain: Low Risk    Difficulty of Paying Living Expenses: Not hard at all  Food Insecurity: No Food Insecurity   Worried About Charity fundraiser in the Last Year: Never true   [place] in the Last Year: Never true  Transportation Needs: No Transportation Needs   Lack of Transportation (Medical): No   Lack of Transportation (Non-Medical): No  Physical Activity: Insufficiently Active   Days of Exercise per Week: 2 days   Minutes of Exercise per Session: 10 min  Stress: No Stress Concern Present   Feeling of Stress : Only a little  Social Connections: Moderately Isolated   Frequency of Communication with Friends and Family: Three times a week   Frequency of Social Gatherings with Friends and Family: More than three times a week   Attends Religious Services: More than 4 times per year   Active Member of [employer] or Organizations: No   Attends Archivist Meetings: Never   Marital Status: Separated     Family History: The patient's family history includes Diabetes in his mother; Heart disease in his father.  ROS:   Please see the history of present illness.     All other systems reviewed and are negative.  EKGs/Labs/Other Studies Reviewed:    The following studies were reviewed today:  Cardiac catheterization 09/16/2020   LPAV lesion is 60% stenosed.   Dist LM to Ost LAD lesion is 30% stenosed.   The left ventricular systolic function is normal.   SUMMARY Mild diffuse multivessel disease with no obvious significant stenosis.   Most notable stenosis is in the distal AV groove LCx roughly 60% (not positive by CT FFR). Significant systemic hypertension with normal LV EDP. Normal LV function with no obvious wall motion  abnormality Suspect MICROVASCULAR DISEASE - ([place])  09/05/2020 Left main: 0   Left anterior descending artery: 989   Left circumflex artery: 259   Right coronary artery: 276   Total: 1523   Percentile: 90th   Other  findings:   Normal pulmonary vein drainage into the left atrium.   Normal let atrial appendage without a thrombus.   Normal size of the pulmonary artery.   IMPRESSION: 1. Coronary calcium score of 1523. This was 90th percentile for age-, race-, and sex-matched controls.   2. Normal coronary origin with left dominance.   3. There is severe (>70%) plaque in the distal LCX, and mild (25-49% stenosis in RCA, LAD, and OM1. CAD-RADS 4. 1. Left Main: FFRct 1.0   2. LAD: FFRct 0.95 proximal, 0.89 mid, 0.8 distal   3. LCX: FFRct 0.98 proximal, 0.98 mid, 0.77 distal.  OM1 FFRct 0.8   4. RCA: There are two small vessels arising form the RCA ostium. FFRct for RCA1 is 0.85. FFRct for RCA2 is 0.92 proximally and 0.69 mid.   Echocardiogram 06/28/2020   1. Left ventricular ejection fraction, by estimation, is 55 to 60%. The  left ventricle has normal function. The left ventricle has no regional  wall motion abnormalities. There is mild left ventricular hypertrophy.  Left ventricular diastolic parameters  are consistent with Grade I diastolic dysfunction (impaired relaxation).   2. Right ventricular systolic function is normal. The right ventricular  size is normal.   3. The mitral valve is normal in structure. No evidence of mitral valve  regurgitation.   4. The aortic valve is tricuspid. Aortic valve regurgitation is mild.  Mild to moderate aortic valve sclerosis/calcification is present, without  any evidence of aortic stenosis.    EKG:  EKG is not ordered today.  The ekg ordered last month demonstrates sinus rhythm, normal tracing except for borderline QTC 469 ms  Recent Labs: 12/14/2019: BNP 106.9 05/20/2020: ALT 32 09/12/2020: Hemoglobin 12.9; Platelets  245 10/03/2020: BUN 11; Creatinine, Ser 0.81; Potassium 3.9; Sodium 142  Recent Lipid Panel    Component Value Date/Time   CHOL 156 05/21/2020 0130   CHOL 170 07/30/2017 1425   TRIG 50 05/21/2020 0130   HDL 72 05/21/2020 0130   HDL 43 07/30/2017 1425   CHOLHDL 2.2 05/21/2020 0130   VLDL 10 05/21/2020 0130   LDLCALC 74 05/21/2020 0130   LDLCALC 80 07/30/2017 1425   LDLDIRECT 99 06/24/2007 2025     Risk Assessment/Calculations:           Physical Exam:    VS:  BP (!) 164/76   Pulse 84   Ht '5\' 10"'$  (1.778 m)   Wt 177 lb (80.3 kg)   SpO2 98%   BMI 25.40 kg/m     Wt Readings from Last 3 Encounters:  10/05/20 177 lb (80.3 kg)  10/03/20 176 lb 12.8 oz (80.2 kg)  09/16/20 175 lb (79.4 kg)     General: Alert, oriented x3, no distress, appears well Head: no evidence of trauma, PERRL, EOMI, no exophtalmos or lid lag, no myxedema, no xanthelasma; normal ears, nose and oropharynx Neck: normal jugular venous pulsations and no hepatojugular reflux; brisk carotid pulses without delay and no carotid bruits Chest: clear to auscultation, no signs of consolidation by percussion or palpation, normal fremitus, symmetrical and full respiratory excursions Cardiovascular: normal position and quality of the apical impulse, regular rhythm, normal first and second heart sounds, no murmurs, rubs or gallops Abdomen: no tenderness or distention, no masses by palpation, no abnormal pulsatility or arterial bruits, normal bowel sounds, no hepatosplenomegaly Extremities: Hand joint deformities, but no clubbing, cyanosis or edema; 2+ radial, ulnar and brachial pulses bilaterally; 2+ right femoral, posterior tibial and dorsalis pedis pulses; 2+ left  femoral, posterior tibial and dorsalis pedis pulses; no subclavian or femoral bruits Neurological: grossly nonfocal Psych: Normal mood and affect    ASSESSMENT:    1. Coronary artery disease involving native coronary artery of native heart with unstable  angina pectoris ([place])   2. Essential hypertension      PLAN:    In order of problems listed above:  CAD/angina/dyspnea on exertion: Chest pain symptoms continue to be exertional, despite the absence of meaningful coronary stenosis on both CT and conventional angiography.  We will increase the metoprolol to 50 mg once daily.  Continue aspirin and statin.   HTN: This is elevated today.  Increase beta-blocker.        Medication Adjustments/Labs and Tests Ordered: Current medicines are reviewed at length with the patient today.  Concerns regarding medicines are outlined above.  No orders of the defined types were placed in this encounter.   Meds ordered this encounter  Medications   metoprolol succinate (TOPROL-XL) 50 MG 24 hr tablet    Sig: Take 1 tablet (50 mg total) by mouth daily. Take with or immediately following a meal.    Dispense:  90 tablet    Refill:  3    DISCONTINUE  25 MG DOSE      Patient Instructions  Medication Instructions:   INCREASE METOPROLOL TO 50 MG DAILY   *If you need a refill on your cardiac medications before your next appointment, please call your pharmacy*   Lab Work: NOT NEEDED   Testing/Procedures: NOT NEEDED   Follow-Up: At [facility], you and your health needs are our priority.  As part of our continuing mission to provide you with exceptional heart care, we have created designated Provider Care Teams.  These Care Teams include your primary Cardiologist (physician) and Advanced Practice Providers (APPs -  Physician Assistants and Nurse Practitioners) who all work together to provide you with the care you need, when you need it.  We recommend signing up for the patient portal called "MyChart".  Sign up information is provided on this After Visit Summary.  MyChart is used to connect with patients for Virtual Visits (Telemedicine).  Patients are able to view lab/test results, encounter notes, upcoming appointments, etc.  Non-urgent  messages can be sent to your provider as well.   To learn more about what you can do with MyChart, go to [email or web address].    Your next appointment:   2 month(s)  The format for your next appointment:   In Person  Provider:   You will see one of the following Advanced Practice Providers on your designated Care Team:   Almyra Deforest, PA-C Fabian Sharp, PA-C or  Roby Lofts, PA-C  Then, Sanda Klein, MD will plan to see you again in 4 month(s).       Signed, Sanda Klein, MD  10/06/2020 3:58 PM    [place]

## 2020-10-05 NOTE — Patient Instructions (Signed)
Medication Instructions:   INCREASE METOPROLOL TO 50 MG DAILY   *If you need a refill on your cardiac medications before your next appointment, please call your pharmacy*   Lab Work: NOT NEEDED   Testing/Procedures: NOT NEEDED   Follow-Up: At [facility], you and your health needs are our priority.  As part of our continuing mission to provide you with exceptional heart care, we have created designated Provider Care Teams.  These Care Teams include your primary Cardiologist (physician) and Advanced Practice Providers (APPs -  Physician Assistants and Nurse Practitioners) who all work together to provide you with the care you need, when you need it.  We recommend signing up for the patient portal called "MyChart".  Sign up information is provided on this After Visit Summary.  MyChart is used to connect with patients for Virtual Visits (Telemedicine).  Patients are able to view lab/test results, encounter notes, upcoming appointments, etc.  Non-urgent messages can be sent to your provider as well.   To learn more about what you can do with MyChart, go to [email or web address].    Your next appointment:   2 month(s)  The format for your next appointment:   In Person  Provider:   You will see one of the following Advanced Practice Providers on your designated Care Team:   Almyra Deforest, PA-C Fabian Sharp, PA-C or  Roby Lofts, PA-C  Then, Sanda Klein, MD will plan to see you again in 4 month(s).

## 2020-10-06 ENCOUNTER — Other Ambulatory Visit: Payer: Self-pay | Admitting: Family Medicine

## 2020-10-06 DIAGNOSIS — I10 Essential (primary) hypertension: Secondary | ICD-10-CM

## 2020-10-11 ENCOUNTER — Other Ambulatory Visit: Payer: Self-pay | Admitting: Family Medicine

## 2020-10-11 DIAGNOSIS — G8929 Other chronic pain: Secondary | ICD-10-CM | POA: Diagnosis not present

## 2020-10-11 DIAGNOSIS — Z79899 Other long term (current) drug therapy: Secondary | ICD-10-CM | POA: Diagnosis not present

## 2020-10-11 DIAGNOSIS — M546 Pain in thoracic spine: Secondary | ICD-10-CM | POA: Diagnosis not present

## 2020-10-11 DIAGNOSIS — M545 Low back pain, unspecified: Secondary | ICD-10-CM | POA: Diagnosis not present

## 2020-10-11 DIAGNOSIS — R030 Elevated blood-pressure reading, without diagnosis of hypertension: Secondary | ICD-10-CM | POA: Diagnosis not present

## 2020-10-11 DIAGNOSIS — I10 Essential (primary) hypertension: Secondary | ICD-10-CM

## 2020-10-11 DIAGNOSIS — G894 Chronic pain syndrome: Secondary | ICD-10-CM | POA: Diagnosis not present

## 2020-10-13 DIAGNOSIS — Z79899 Other long term (current) drug therapy: Secondary | ICD-10-CM | POA: Diagnosis not present

## 2020-10-15 ENCOUNTER — Other Ambulatory Visit: Payer: Self-pay | Admitting: Family Medicine

## 2020-10-15 DIAGNOSIS — I10 Essential (primary) hypertension: Secondary | ICD-10-CM

## 2020-11-11 DIAGNOSIS — M545 Low back pain, unspecified: Secondary | ICD-10-CM | POA: Diagnosis not present

## 2020-11-11 DIAGNOSIS — M546 Pain in thoracic spine: Secondary | ICD-10-CM | POA: Diagnosis not present

## 2020-11-11 DIAGNOSIS — Z79899 Other long term (current) drug therapy: Secondary | ICD-10-CM | POA: Diagnosis not present

## 2020-11-11 DIAGNOSIS — Z0000 Encounter for general adult medical examination without abnormal findings: Secondary | ICD-10-CM | POA: Diagnosis not present

## 2020-11-11 DIAGNOSIS — G894 Chronic pain syndrome: Secondary | ICD-10-CM | POA: Diagnosis not present

## 2020-11-11 DIAGNOSIS — G8929 Other chronic pain: Secondary | ICD-10-CM | POA: Diagnosis not present

## 2020-11-11 DIAGNOSIS — R030 Elevated blood-pressure reading, without diagnosis of hypertension: Secondary | ICD-10-CM | POA: Diagnosis not present

## 2020-11-25 ENCOUNTER — Other Ambulatory Visit: Payer: Self-pay | Admitting: Family Medicine

## 2020-11-25 DIAGNOSIS — J449 Chronic obstructive pulmonary disease, unspecified: Secondary | ICD-10-CM

## 2020-12-03 ENCOUNTER — Encounter (HOSPITAL_COMMUNITY): Payer: Self-pay | Admitting: Emergency Medicine

## 2020-12-03 ENCOUNTER — Other Ambulatory Visit: Payer: Self-pay

## 2020-12-03 ENCOUNTER — Ambulatory Visit (HOSPITAL_COMMUNITY)
Admission: EM | Admit: 2020-12-03 | Discharge: 2020-12-03 | Disposition: A | Discharge status: Home / Self Care | Payer: Medicare Other | Attending: Physician Assistant | Admitting: Physician Assistant

## 2020-12-03 DIAGNOSIS — M25462 Effusion, left knee: Secondary | ICD-10-CM | POA: Diagnosis not present

## 2020-12-03 DIAGNOSIS — M25461 Effusion, right knee: Secondary | ICD-10-CM

## 2020-12-03 DIAGNOSIS — M25561 Pain in right knee: Secondary | ICD-10-CM

## 2020-12-03 DIAGNOSIS — M25562 Pain in left knee: Secondary | ICD-10-CM

## 2020-12-03 DIAGNOSIS — G8929 Other chronic pain: Secondary | ICD-10-CM

## 2020-12-03 DIAGNOSIS — M069 Rheumatoid arthritis, unspecified: Secondary | ICD-10-CM

## 2020-12-03 MED ORDER — PREDNISONE 10 MG PO TABS: 30.0000 mg | ORAL_TABLET | Freq: Every day | ORAL | 0 refills | Status: AC

## 2020-12-03 NOTE — ED Provider Notes (Signed)
Joel Donaldson    CSN: [id number] Arrival date & time: 12/03/20  1051      History   Chief Complaint Chief Complaint  Patient presents with   Knee Pain    HPI Joel Donaldson is a 80 y.o. male.   Patient presents today with a 1 month history of worsening swelling and pain of bilateral knees.  Pain is rated 7 on a 0-10 pain scale, localized to entire knee bilaterally, described as intense aching, no aggravating alleviating factors identified.  He is prescribed Percocet as well as buprenorphine and has been taking medication as prescribed without improvement of symptoms.  He has tried ibuprofen without improvement of symptoms.  He does have a history of rheumatoid arthritis but is not currently on any disease modifying medication.  He is having difficulty ambulating as result of pain.  Denies any recent falls.  Denies any recent trauma.  Last x-ray obtained at 06/04/2016 left knee shows significant arthritis and effusion.   Past Medical History:  Diagnosis Date   Alcohol abuse    Anxiety    Arthritis    hands, back, knees   Asthma    Depression    GERD (gastroesophageal reflux disease)    Glaucoma    Heart murmur asymptomatic   Hx of radioisotope therapy 07/26/11   Radiosactive Prostate Seed Implant/ I-125 Seeds   Hypertension    Neck pain 10/22/2019   Prostate cancer ([place]) dx'd 2016 or 2017   seed implants   Rheumatoid arthritis ([employer]) 10/10/2012    Patient Active Problem List   Diagnosis Date Noted   Abnormal cardiac CT angiography 09/16/2020   Phlegm in throat 08/17/2020   Epigastric pain 05/20/2020   Atherosclerosis of aorta ([place]) 05/20/2020   Tachycardia    Opioid dependence ([place]) 11/29/2017   Essential hypertension    Chest pain at rest 10/04/2016   Baker's cyst of knee, right 06/21/2016   Right leg swelling 06/14/2016   Back pain 09/19/2015   Unspecified vitamin D deficiency 07/07/2013   Rheumatoid arthritis ([place]) 10/10/2012   Osteoarthritis of both  shoulders 02/15/2012   Tendinopathy of rotator cuff 01/20/2012   Atypical angina ([place]) (Class III) 12/19/2011   Preventative health care 12/03/2011   Osteoarthritis of both knees 07/02/2011   Alcohol use 05/01/2010   INTENTION TREMOR 09/09/2008   Prostate cancer ([place]) 07/01/2008   Hyperlipidemia 06/24/2007   COPD (chronic obstructive pulmonary disease) ([place]) 05/12/2007   GASTROESOPHAGEAL REFLUX, NO ESOPHAGITIS 04/11/2006    Past Surgical History:  Procedure Laterality Date   CYSTOSCOPY  09/14/2011   Procedure: CYSTOSCOPY FLEXIBLE;  Surgeon: Hanley Ben, MD;  Location: [facility];  Service: Urology;  Laterality: N/A;  No seeds seen in the bladder   LEFT HEART CATH AND CORONARY ANGIOGRAPHY N/A 09/16/2020   Procedure: LEFT HEART CATH AND CORONARY ANGIOGRAPHY;  Surgeon: Leonie Man, MD;  Location: [facility];  Service: Cardiovascular;  Laterality: N/A;   [place] IMPLANT  09/14/2011   Procedure: RADIOACTIVE SEED IMPLANT;  Surgeon: Hanley Ben, MD;  Location: [facility];  Service: Urology;  Laterality: N/A;  Total number of seeds -     SHOULDER SURGERY  YRS AGO   LEFT       Home Medications    Prior to Admission medications   Medication Sig Start Date End Date Taking? Authorizing Provider  INCRUSE ELLIPTA 62.5 MCG/INH AEPB INHALE 1 PUFF INTO THE LUNGS DAILY 11/25/20  Brimage, Vondra, DO  predniSONE (DELTASONE) 10 MG tablet Take 3 tablets (30 mg total) by mouth daily for 4 days. 12/03/20 12/07/20 Yes Adisa Vigeant, Derry Skill, PA-C  acetaminophen (TYLENOL) 500 MG tablet Take 1,000 mg by mouth every 8 (eight) hours as needed for mild pain or moderate pain.    [provider]  albuterol (VENTOLIN HFA) 108 (90 Base) MCG/ACT inhaler Inhale 1-2 puffs into the lungs every 4 (four) hours as needed for wheezing or shortness of breath. 06/27/20   Alcus Dad, MD  aspirin EC 81 MG tablet Take 81 mg by mouth every  morning.     [provider]  atorvastatin (LIPITOR) 20 MG tablet Take 1 tablet (20 mg total) by mouth daily. 09/12/20 12/11/20  Croitoru, Mihai, MD  buprenorphine (BUTRANS) 7.5 MCG/HR Place 1 patch onto the skin every Thursday. 05/17/20   [provider]  diclofenac sodium (VOLTAREN) 1 % GEL APPLY 2 GRAMS EXTERNALLY TO THE AFFECTED AREA FOUR TIMES DAILY AS NEEDED FOR PAIN Patient taking differently: Apply 2 g topically See admin instructions. Apply 2 grams topically to bilateral knees one to four times a day for pain 05/02/18   Bonnita Hollow, MD  fluticasone [facility]) 50 MCG/ACT nasal spray SHAKE LIQUID AND USE 2 SPRAYS IN [facility] NOSTRIL DAILY 11/19/18   Bonnita Hollow, MD  lidocaine (XYLOCAINE) 5 % ointment Apply 1 application topically daily as needed for mild pain or moderate pain. 07/13/20   [provider]  lisinopril (ZESTRIL) 20 MG tablet TAKE 2 TABLETS(40MG ) BY MOUTH DAILY 10/18/20   Brimage, Ronnette Juniper, DO  metoprolol succinate (TOPROL-XL) 50 MG 24 hr tablet Take 1 tablet (50 mg total) by mouth daily. Take with or immediately following a meal. 10/05/20 01/03/21  Croitoru, Mihai, MD  Multiple Vitamins-Minerals (ONE-A-DAY PROACTIVE 65+) TABS Take 1 tablet by mouth daily with breakfast.    [provider]  nitroGLYCERIN (NITROSTAT) 0.4 MG SL tablet Place 1 tablet (0.4 mg total) under the tongue every 5 (five) minutes as needed for chest pain (chest pain). 05/21/20   Simmons-Robinson, Riki Sheer, MD  omeprazole (PRILOSEC) 40 MG capsule Take 1 capsule (40 mg total) by mouth daily before breakfast. 07/06/20   Brimage, Ronnette Juniper, DO  oxyCODONE-acetaminophen (PERCOCET) 10-325 MG tablet Take 1 tablet by mouth every 12 (twelve) hours as needed for pain. Patient taking differently: Take 1 tablet by mouth 3 (three) times daily as needed for pain. 11/24/18   Bonnita Hollow, MD  polyethylene glycol [facility] / Floria Raveling) packet Take 17 g by mouth daily as needed for mild constipation.  10/05/16   Bonnita Hollow, MD    Family History Family History  Problem Relation Age of Onset   Diabetes Mother    Heart disease Father        lived to 87 - had bypass, pacemaker    Social History Social History   Tobacco Use   Smoking status: Former    Packs/day: 2.00    Years: 50.00    Pack years: 100.00    Types: Cigarettes    Start date: 02/12/1953    Quit date: 10/13/2004    Years since quitting: 16.1   Smokeless tobacco: Never   Tobacco comments:    Smoked 2 ppd  Vaping Use   Vaping Use: Never used  Substance Use Topics   Alcohol use: Not Currently    Alcohol/week: 14.0 standard drinks    Types: 14 Shots of liquor per week    Comment: 1/2 pint of liquor  daily   Drug use: No     Allergies   Tramadol and Pollen extract   Review of Systems Review of Systems  Constitutional:  Positive for activity change. Negative for appetite change, fatigue and fever.  Respiratory:  Negative for cough and shortness of breath.   Cardiovascular:  Negative for chest pain.  Gastrointestinal:  Negative for abdominal pain, diarrhea, nausea and vomiting.  Musculoskeletal:  Positive for arthralgias, gait problem and joint swelling. Negative for myalgias.  Neurological:  Negative for dizziness, light-headedness and headaches.    Physical Exam Triage Vital Signs ED Triage Vitals  Enc Vitals Group     BP 12/03/20 1210 (!) 148/78     Pulse Rate 12/03/20 1210 77     Resp 12/03/20 1210 (!) 22     Temp 12/03/20 1210 98.2 F (36.8 C)     Temp Source 12/03/20 1210 Oral     SpO2 12/03/20 1210 100 %     Weight --      Height --      Head Circumference --      Peak Flow --      Pain Score 12/03/20 1207 7     Pain Loc --      Pain Edu? --      Excl. in [place]? --    No data found.  Updated Vital Signs BP (!) 148/78 (BP Location: Right Arm)   Pulse 77   Temp 98.2 F (36.8 C) (Oral)   Resp (!) 22   SpO2 100%   Visual Acuity Right Eye Distance:   Left Eye Distance:    Bilateral Distance:    Right Eye Near:   Left Eye Near:    Bilateral Near:     Physical Exam Vitals reviewed.  Constitutional:      General: He is awake.     Appearance: Normal appearance. He is well-developed. He is not ill-appearing.     Comments: Very pleasant male appears stated age in no acute distress  HENT:     Head: Normocephalic and atraumatic.     Mouth/Throat:     Pharynx: No oropharyngeal exudate, posterior oropharyngeal erythema or uvula swelling.  Cardiovascular:     Rate and Rhythm: Normal rate and regular rhythm.     Pulses:          Posterior tibial pulses are 2+ on the right side and 2+ on the left side.     Heart sounds: Normal heart sounds, S1 normal and S2 normal. No murmur heard. Pulmonary:     Effort: Pulmonary effort is normal.     Breath sounds: Normal breath sounds. No stridor. No wheezing, rhonchi or rales.     Comments: Clear to auscultation bilaterally Musculoskeletal:     Right knee: Swelling, effusion and crepitus present. No bony tenderness. Decreased range of motion. No tenderness. No LCL laxity, MCL laxity, ACL laxity or PCL laxity.     Left knee: Swelling, effusion and crepitus present. No bony tenderness. Decreased range of motion. No tenderness. No LCL laxity, MCL laxity, ACL laxity or PCL laxity.    Comments: Knees: Moderate effusion with significant swelling bilateral knees.  Decreased range of motion with flexion and extension secondary to pain.  Crepitus noted.  No ligamentous laxity on exam.  Neurological:     Mental Status: He is alert.  Psychiatric:        Behavior: Behavior is cooperative.     UC Treatments / Results  Labs (all labs ordered are  listed, but only abnormal results are displayed) Labs Reviewed - No data to display  EKG   Radiology No results found.  Procedures Procedures (including critical care time)  Medications Ordered in UC Medications - No data to display  Initial Impression / Assessment and Plan /  UC Course  I have reviewed the triage vital signs and the nursing notes.  Pertinent labs & imaging results that were available during my care of the patient were reviewed by me and considered in my medical decision making (see chart for details).     Discussed potential utility of plain films but given patient has known arthritis with no recent trauma will defer films for the time being.  Discussed that we are unable to drain effusion and if this is causing him pain he should follow-up with orthopedics.  He was given contact information for local provider and encouraged to call them to schedule an appointment.  Concern symptoms are related to untreated rheumatoid arthritis.  Patient can continue his prescribed pain medication but will add prednisone burst in the hopes of better symptom control.  He was prescribed prednisone 30 mg for 4 days with instruction not to take NSAIDs with this medication due to risk of GI bleeding other than his prescribed daily aspirin.  Discussed that if symptoms persist may be worthwhile to see a rheumatologist to consider disease modifying medication this would not be arranged through his primary care provider.  Discussed alarm symptoms that warrant emergent evaluation.  Strict return precautions given to which he expressed understanding.  Final Clinical Impressions(s) / UC Diagnoses   Final diagnoses:  Chronic pain of both knees  Effusion of both knee joints  Rheumatoid arthritis involving multiple sites, unspecified whether rheumatoid factor present [facility])     Discharge Instructions      I am concerned that your increased knee pain is related to worsening of your rheumatoid arthritis.  Please start prednisone 30 mg for 4 days.  Do not take NSAIDs including aspirin, ibuprofen/Advil, naproxen/Aleve with this medication.  You can continue your aspirin 81 mg every day but should not take additional aspirin.  You can continue your prescribed medication as well as  Tylenol.  Ultimately, I think you need to see an orthopedic doctor to consider draining the effusion as well as potentially rheumatologist.  Please follow-up with orthopedist to consider draining the effusion as we discussed and follow-up with your primary care provider to determine if seeing a rheumatologist is appropriate.  If you have any worsening symptoms you need to be reevaluated.     ED Prescriptions     Medication Sig Dispense Auth. Provider   predniSONE (DELTASONE) 10 MG tablet Take 3 tablets (30 mg total) by mouth daily for 4 days. 12 tablet Janyth Riera, Derry Skill, PA-C      PDMP not reviewed this encounter.   Terrilee Croak, PA-C 12/03/20 1243

## 2020-12-03 NOTE — ED Triage Notes (Signed)
Patient complains of bilateral knee pain .  Onset one month ago.  Patient has bilateral knee swelling.  History of the same

## 2020-12-03 NOTE — Discharge Instructions (Signed)
I am concerned that your increased knee pain is related to worsening of your rheumatoid arthritis.  Please start prednisone 30 mg for 4 days.  Do not take NSAIDs including aspirin, ibuprofen/Advil, naproxen/Aleve with this medication.  You can continue your aspirin 81 mg every day but should not take additional aspirin.  You can continue your prescribed medication as well as Tylenol.  Ultimately, I think you need to see an orthopedic doctor to consider draining the effusion as well as potentially rheumatologist.  Please follow-up with orthopedist to consider draining the effusion as we discussed and follow-up with your primary care provider to determine if seeing a rheumatologist is appropriate.  If you have any worsening symptoms you need to be reevaluated.

## 2020-12-05 ENCOUNTER — Emergency Department (HOSPITAL_COMMUNITY): Payer: Medicare Other

## 2020-12-05 ENCOUNTER — Emergency Department (HOSPITAL_COMMUNITY)
Admission: EM | Admit: 2020-12-05 | Discharge: 2020-12-05 | Disposition: A | Discharge status: Home / Self Care | Payer: Medicare Other | Attending: Emergency Medicine | Admitting: Emergency Medicine

## 2020-12-05 DIAGNOSIS — Z79899 Other long term (current) drug therapy: Secondary | ICD-10-CM | POA: Diagnosis not present

## 2020-12-05 DIAGNOSIS — I672 Cerebral atherosclerosis: Secondary | ICD-10-CM | POA: Diagnosis not present

## 2020-12-05 DIAGNOSIS — J45909 Unspecified asthma, uncomplicated: Secondary | ICD-10-CM | POA: Insufficient documentation

## 2020-12-05 DIAGNOSIS — S0990XA Unspecified injury of head, initial encounter: Secondary | ICD-10-CM | POA: Insufficient documentation

## 2020-12-05 DIAGNOSIS — R0782 Intercostal pain: Secondary | ICD-10-CM | POA: Diagnosis not present

## 2020-12-05 DIAGNOSIS — Z87891 Personal history of nicotine dependence: Secondary | ICD-10-CM | POA: Diagnosis not present

## 2020-12-05 DIAGNOSIS — M19012 Primary osteoarthritis, left shoulder: Secondary | ICD-10-CM | POA: Diagnosis not present

## 2020-12-05 DIAGNOSIS — M25512 Pain in left shoulder: Secondary | ICD-10-CM | POA: Insufficient documentation

## 2020-12-05 DIAGNOSIS — Z8546 Personal history of malignant neoplasm of prostate: Secondary | ICD-10-CM | POA: Insufficient documentation

## 2020-12-05 DIAGNOSIS — Z7982 Long term (current) use of aspirin: Secondary | ICD-10-CM | POA: Insufficient documentation

## 2020-12-05 DIAGNOSIS — M19011 Primary osteoarthritis, right shoulder: Secondary | ICD-10-CM | POA: Diagnosis not present

## 2020-12-05 DIAGNOSIS — M25522 Pain in left elbow: Secondary | ICD-10-CM | POA: Diagnosis not present

## 2020-12-05 DIAGNOSIS — S199XXA Unspecified injury of neck, initial encounter: Secondary | ICD-10-CM | POA: Diagnosis not present

## 2020-12-05 DIAGNOSIS — J449 Chronic obstructive pulmonary disease, unspecified: Secondary | ICD-10-CM | POA: Diagnosis not present

## 2020-12-05 DIAGNOSIS — W19XXXA Unspecified fall, initial encounter: Secondary | ICD-10-CM

## 2020-12-05 DIAGNOSIS — M47812 Spondylosis without myelopathy or radiculopathy, cervical region: Secondary | ICD-10-CM | POA: Diagnosis not present

## 2020-12-05 DIAGNOSIS — W06XXXA Fall from bed, initial encounter: Secondary | ICD-10-CM | POA: Diagnosis not present

## 2020-12-05 DIAGNOSIS — Z7951 Long term (current) use of inhaled steroids: Secondary | ICD-10-CM | POA: Insufficient documentation

## 2020-12-05 DIAGNOSIS — R0781 Pleurodynia: Secondary | ICD-10-CM | POA: Diagnosis not present

## 2020-12-05 DIAGNOSIS — I10 Essential (primary) hypertension: Secondary | ICD-10-CM | POA: Diagnosis not present

## 2020-12-05 DIAGNOSIS — R079 Chest pain, unspecified: Secondary | ICD-10-CM | POA: Diagnosis not present

## 2020-12-05 DIAGNOSIS — I700 Atherosclerosis of aorta: Secondary | ICD-10-CM | POA: Diagnosis not present

## 2020-12-05 NOTE — ED Provider Notes (Signed)
I provided a substantive portion of the care of this patient.  I personally performed the entirety of the medical decision making for this encounter.      80 year old male here for mechanical fall recently.  Complaining of bilateral rib pains although x-rays are negative.  Also had x-rays of other extremities which were also negative.  Will discharge home   Lacretia Leigh, MD 12/05/20 1454

## 2020-12-05 NOTE — ED Notes (Signed)
Pt in xray

## 2020-12-05 NOTE — ED Notes (Signed)
Pt verbalizes understanding of discharge instructions. Opportunity for questions and answers were provided. Pt discharged from the ED.   ?

## 2020-12-05 NOTE — ED Triage Notes (Signed)
Pt. Stated, I fell out of bed last nigh, injured my left rib, and left shoulder

## 2020-12-05 NOTE — ED Notes (Signed)
Called pt to be brought back to room, no response.

## 2020-12-05 NOTE — ED Provider Notes (Signed)
Emergency Medicine Provider Triage Evaluation Note  ROXY FILLER , a 80 y.o. male  was evaluated in triage.  Pt complains of left shoulder, head, and right rib pain after fall.  Patient states last night he was asleep when he rolled out of bed, landed on the floor.  He hit his head, did not lose consciousness.  He is not on blood thinners.  Pain of the right ribs, left shoulder, and left parietal skull.  No new pain in his back or legs.  Ambulatory at his baseline.  Review of Systems  Positive: Ha, L shoulder pain, R rib pain Negative: LOC  Physical Exam  BP 139/80 (BP Location: Right Arm)   Pulse 74   Temp 98.9 F (37.2 C) (Oral)   Resp 14   SpO2 99%  Gen:   Awake, no distress   Resp:  Normal effort  MSK:   TTP of the left shoulder.  No TTP of midline back Other:  Ttp of L parietal head. Ttp of R side ribs  Medical Decision Making  Medically screening exam initiated at 10:29 AM.  Appropriate orders placed.  SAMIEL PEEL was informed that the remainder of the evaluation will be completed by another provider, this initial triage assessment does not replace that evaluation, and the importance of remaining in the ED until their evaluation is complete.  Ct head, ct c spine, Sherle Poe, PA-C 12/05/20 1032    Daleen Bo, MD 12/05/20 1818

## 2020-12-05 NOTE — ED Notes (Signed)
Patient transported to X-ray 

## 2020-12-05 NOTE — ED Provider Notes (Addendum)
[facility] EMERGENCY DEPARTMENT Provider Note   CSN: [id number] Arrival date & time: 12/05/20  0946     History Chief Complaint  Patient presents with   Fall   Rib Injury   Shoulder Injury    Joel Donaldson is a 80 y.o. male history of of alcohol abuse, anxiety, asthma, depression, GERD, hypertension, prostate cancer, rheumatoid arthritis, hyperlipidemia, COPD.  Patient presented today for evaluation of right rib pain, left shoulder pain and head injury after fall.  Last night patient was lying in bed, rolled out of bed landing on his side, he reports that he lives with a family member and was helped off the ground within a few minutes.  He describes right rib pain as sharp worsened with palpation, improves with rest, pain does not radiate.  Patient describes left shoulder pain as moderate, aching worsened with overhead movements, improves with rest, pain does not radiate.  Patient reports that he hit the top of his head during the fall but has no pain of the area.  He denies any blood thinner use, loss consciousness, nausea, vomiting, vision changes, neck pain, abdominal pain, pelvic pain, lower extremity pain, right upper extremity pain, numbness, weakness, tingling or any additional concerns.  HPI     Past Medical History:  Diagnosis Date   Alcohol abuse    Anxiety    Arthritis    hands, back, knees   Asthma    Depression    GERD (gastroesophageal reflux disease)    Glaucoma    Heart murmur asymptomatic   Hx of radioisotope therapy 07/26/11   Radiosactive Prostate Seed Implant/ I-125 Seeds   Hypertension    Neck pain 10/22/2019   Prostate cancer ([place]) dx'd 2016 or 2017   seed implants   Rheumatoid arthritis ([place]) 10/10/2012    Patient Active Problem List   Diagnosis Date Noted   Abnormal cardiac CT angiography 09/16/2020   Phlegm in throat 08/17/2020   Epigastric pain 05/20/2020   Atherosclerosis of aorta ([place]) 05/20/2020   Tachycardia    Opioid  dependence ([place]) 11/29/2017   Essential hypertension    Chest pain at rest 10/04/2016   Baker's cyst of knee, right 06/21/2016   Right leg swelling 06/14/2016   Back pain 09/19/2015   Unspecified vitamin D deficiency 07/07/2013   Rheumatoid arthritis ([place]) 10/10/2012   Osteoarthritis of both shoulders 02/15/2012   Tendinopathy of rotator cuff 01/20/2012   Atypical angina ([place]) (Class III) 12/19/2011   Preventative health care 12/03/2011   Osteoarthritis of both knees 07/02/2011   Alcohol use 05/01/2010   INTENTION TREMOR 09/09/2008   Prostate cancer ([place]) 07/01/2008   Hyperlipidemia 06/24/2007   COPD (chronic obstructive pulmonary disease) ([place]) 05/12/2007   GASTROESOPHAGEAL REFLUX, NO ESOPHAGITIS 04/11/2006    Past Surgical History:  Procedure Laterality Date   CYSTOSCOPY  09/14/2011   Procedure: CYSTOSCOPY FLEXIBLE;  Surgeon: Hanley Ben, MD;  Location: [place];  Service: Urology;  Laterality: N/A;  No seeds seen in the bladder   LEFT HEART CATH AND CORONARY ANGIOGRAPHY N/A 09/16/2020   Procedure: LEFT HEART CATH AND CORONARY ANGIOGRAPHY;  Surgeon: Leonie Man, MD;  Location: [place] [facility];  Service: Cardiovascular;  Laterality: N/A;   [place] IMPLANT  09/14/2011   Procedure: RADIOACTIVE SEED IMPLANT;  Surgeon: Hanley Ben, MD;  Location: [place];  Service: Urology;  Laterality: N/A;  Total number of seeds -  SHOULDER SURGERY  YRS AGO   LEFT       Family History  Problem Relation Age of Onset   Diabetes Mother    Heart disease Father        lived to 39 - had bypass, pacemaker    Social History   Tobacco Use   Smoking status: Former    Packs/day: 2.00    Years: 50.00    Pack years: 100.00    Types: Cigarettes    Start date: 02/12/1954    Quit date: 10/13/2004    Years since quitting: 16.1   Smokeless tobacco: Never   Tobacco comments:    Smoked 2 ppd  Vaping Use   Vaping Use:  Never used  Substance Use Topics   Alcohol use: Not Currently    Alcohol/week: 14.0 standard drinks    Types: 14 Shots of liquor per week    Comment: 1/2 pint of liquor daily   Drug use: No    Home Medications Prior to Admission medications   Medication Sig Start Date End Date Taking? Authorizing Provider  INCRUSE ELLIPTA 62.5 MCG/INH AEPB INHALE 1 PUFF INTO THE LUNGS DAILY 11/25/20   [name], DO  acetaminophen (TYLENOL) 500 MG tablet Take 1,000 mg by mouth every 8 (eight) hours as needed for mild pain or moderate pain.    [provider]  albuterol (VENTOLIN HFA) 108 (90 Base) MCG/ACT inhaler Inhale 1-2 puffs into the lungs every 4 (four) hours as needed for wheezing or shortness of breath. 06/27/20   Alcus Dad, MD  aspirin EC 81 MG tablet Take 81 mg by mouth every morning.     [provider]  atorvastatin (LIPITOR) 20 MG tablet Take 1 tablet (20 mg total) by mouth daily. 09/12/20 12/11/20  Croitoru, Mihai, MD  buprenorphine (BUTRANS) 7.5 MCG/HR Place 1 patch onto the skin every Thursday. 05/17/20   [provider]  diclofenac sodium (VOLTAREN) 1 % GEL APPLY 2 GRAMS EXTERNALLY TO THE AFFECTED AREA FOUR TIMES DAILY AS NEEDED FOR PAIN Patient taking differently: Apply 2 g topically See admin instructions. Apply 2 grams topically to bilateral knees one to four times a day for pain 05/02/18   Bonnita Hollow, MD  fluticasone [facility]) 50 MCG/ACT nasal spray SHAKE LIQUID AND USE 2 SPRAYS IN [facility] NOSTRIL DAILY 11/19/18   Bonnita Hollow, MD  lidocaine (XYLOCAINE) 5 % ointment Apply 1 application topically daily as needed for mild pain or moderate pain. 07/13/20   [provider]  lisinopril (ZESTRIL) 20 MG tablet TAKE 2 TABLETS(40MG ) BY MOUTH DAILY 10/18/20   Brimage, Ronnette Juniper, DO  metoprolol succinate (TOPROL-XL) 50 MG 24 hr tablet Take 1 tablet (50 mg total) by mouth daily. Take with or immediately following a meal. 10/05/20 01/03/21  Croitoru, Mihai, MD   Multiple Vitamins-Minerals (ONE-A-DAY PROACTIVE 65+) TABS Take 1 tablet by mouth daily with breakfast.    [provider]  nitroGLYCERIN (NITROSTAT) 0.4 MG SL tablet Place 1 tablet (0.4 mg total) under the tongue every 5 (five) minutes as needed for chest pain (chest pain). 05/21/20   Simmons-Robinson, Riki Sheer, MD  omeprazole (PRILOSEC) 40 MG capsule Take 1 capsule (40 mg total) by mouth daily before breakfast. 07/06/20   Brimage, Ronnette Juniper, DO  oxyCODONE-acetaminophen (PERCOCET) 10-325 MG tablet Take 1 tablet by mouth every 12 (twelve) hours as needed for pain. Patient taking differently: Take 1 tablet by mouth 3 (three) times daily as needed for pain. 11/24/18   Bonnita Hollow, MD  polyethylene glycol (MIRALAX / GLYCOLAX) packet Take 17 g by mouth daily as needed for mild constipation. 10/05/16   Bonnita Hollow, MD  predniSONE (DELTASONE) 10 MG tablet Take 3 tablets (30 mg total) by mouth daily for 4 days. 12/03/20 12/07/20  Raspet, Derry Skill, PA-C    Allergies    Tramadol and Pollen extract  Review of Systems   Review of Systems Ten systems are reviewed and are negative for acute change except as noted in the HPI  Physical Exam Updated Vital Signs BP (!) 146/87   Pulse (!) 59   Temp 98.9 F (37.2 C) (Oral)   Resp 20   SpO2 100%   Physical Exam Constitutional:      General: He is not in acute distress.    Appearance: Normal appearance. He is well-developed. He is not ill-appearing or diaphoretic.  HENT:     Head: Normocephalic and atraumatic.     Jaw: There is normal jaw occlusion. No trismus.     Right Ear: External ear normal.     Left Ear: External ear normal.     Nose: Nose normal.     Mouth/Throat:     Mouth: Mucous membranes are moist.     Pharynx: Oropharynx is clear.     Comments: Edentulous Eyes:     General: Vision grossly intact. Gaze aligned appropriately.     Extraocular Movements: Extraocular movements intact.     Conjunctiva/sclera: Conjunctivae  normal.     Pupils: Pupils are equal, round, and reactive to light.  Neck:     Trachea: Trachea and phonation normal. No tracheal tenderness or tracheal deviation.  Cardiovascular:     Rate and Rhythm: Normal rate and regular rhythm.     Pulses:          Radial pulses are 2+ on the right side and 2+ on the left side.       Dorsalis pedis pulses are 2+ on the right side and 2+ on the left side.  Pulmonary:     Effort: Pulmonary effort is normal. No accessory muscle usage or respiratory distress.     Breath sounds: Normal breath sounds and air entry.  Chest:     Chest wall: Tenderness present. No deformity or crepitus.    Abdominal:     General: There is no distension. There are no signs of injury.     Palpations: Abdomen is soft.     Tenderness: There is no abdominal tenderness. There is no guarding or rebound.  Musculoskeletal:        General: Normal range of motion.     Cervical back: Normal range of motion. No spinous process tenderness or muscular tenderness.     Comments: No midline C/T/L spinal tenderness to palpation, no paraspinal muscle tenderness, no deformity, crepitus, or step-off noted. No sign of injury to the neck or back.  Pelvis stable to compression bilaterally without pain.  Patient abdomen bilateral knees to chest without pain or difficulty.  Right upper extremity fully mobilized without pain or difficulty. ------ Left shoulder, no overlying skin changes or evidence of trauma.  No deformity or skin break.  Mild tenderness palpation along the deltoid, pain increases with overhead range of motion, minimal pain with motion below shoulder level.  Passive range of motion above shoulder level appears intact remains intact.  Neurovascular intact distally.  Compartments are soft.  Left elbow, diffuse tenderness palpation without focal or point tenderness.  No overlying skin changes, deformity or skin break.  Full range of motion appropriate strength of the left elbow with some  increased pain.  Neurovascular intact distally with compartments soft.  No pain with motion at the hand or wrist.  Skin:    General: Skin is warm and dry.  Neurological:     Mental Status: He is alert.     GCS: GCS eye subscore is 4. GCS verbal subscore is 5. GCS motor subscore is 6.     Comments: Speech is clear and goal oriented, follows commands Major Cranial nerves without deficit, no facial droop Moves extremities without ataxia, coordination intact  Psychiatric:        Behavior: Behavior normal.    ED Results / Procedures / Treatments   Labs (all labs ordered are listed, but only abnormal results are displayed) Labs Reviewed - No data to display  EKG None  Radiology DG Ribs Unilateral W/Chest Right  Result Date: 12/05/2020 CLINICAL DATA:  [place].  Right chest pain. EXAM: RIGHT RIBS AND CHEST - 3+ VIEW COMPARISON:  05/20/2020 FINDINGS: Heart size upper limits of normal. Atherosclerosis and tortuosity of the aorta. The lungs are clear. No pneumothorax or hemothorax. Old healed fractures of the left lateral third and fourth ribs. Right rib films do not show any visible fracture. Chronic degenerative changes affect the right shoulder as well as the left. IMPRESSION: No active cardiopulmonary disease. Heart size upper limits of normal. Aortic atherosclerosis. No visible rib fracture on the right. Old healed fractures of the left third and fourth ribs. Electronically Signed   By: Nelson Chimes M.D.   On: 12/05/2020 12:52   DG Elbow Complete Left  Result Date: 12/05/2020 CLINICAL DATA:  [place] from bed last night, LEFT shoulder and posterior elbow pain, initial encounter EXAM: LEFT ELBOW - COMPLETE 3+ VIEW COMPARISON:  None FINDINGS: Mild osseous demineralization. Joint spaces preserved. No acute fracture, dislocation, or bone destruction. No joint effusion. IMPRESSION: No acute osseous abnormalities. Electronically Signed   By: Lavonia Dana M.D.   On: 12/05/2020 14:06   CT Head Wo  Contrast  Result Date: 12/05/2020 CLINICAL DATA:  Head trauma. Fell from bed last night. Trauma to the left parietal region. EXAM: CT HEAD WITHOUT CONTRAST TECHNIQUE: Contiguous axial images were obtained from the base of the skull through the vertex without intravenous contrast. COMPARISON:  02/17/2016 FINDINGS: Brain: Mild age related volume loss. No evidence of acute infarction, mass lesion, hemorrhage, hydrocephalus or extra-axial collection. Minimal small vessel change of the deep white matter. Vascular: There is atherosclerotic calcification of the major vessels at the base of the brain. Skull: No skull fracture. Sinuses/Orbits: Clear/normal Other: No visible soft tissue injury. IMPRESSION: No acute or traumatic finding. Mild age related volume loss and small-vessel change of the white matter. Electronically Signed   By: Nelson Chimes M.D.   On: 12/05/2020 12:46   CT Cervical Spine Wo Contrast  Result Date: 12/05/2020 CLINICAL DATA:  [place] from bed last night. Trauma to the head and neck. EXAM: CT CERVICAL SPINE WITHOUT CONTRAST TECHNIQUE: Multidetector CT imaging of the cervical spine was performed without intravenous contrast. Multiplanar CT image reconstructions were also generated. COMPARISON:  04/15/2015 FINDINGS: Alignment: Minimal curvature convex to the left. Straightening of the normal cervical lordosis. Skull base and vertebrae: No fracture or focal bone lesion. Soft tissues and spinal canal: No traumatic soft tissue finding. Disc levels: Chronic degenerative spondylosis throughout the cervical region with disc space narrowing and small endplate osteophytes. Mild facet osteoarthritis. No compressive canal stenosis. Foraminal narrowing that  could possibly affect the exiting nerves on the left at C2-3, bilateral at C3-4, bilateral at C4-5, on the right at C5-6 and bilateral at C7-T1. Upper chest: Negative Other: None IMPRESSION: No acute or traumatic finding. Chronic spondylosis and facet  osteoarthritis. Foraminal narrowing at multiple levels as above. Electronically Signed   By: Nelson Chimes M.D.   On: 12/05/2020 12:48   DG Pelvis Portable  Result Date: 12/05/2020 CLINICAL DATA:  Hip pain since falling off bed last night EXAM: PORTABLE PELVIS 1-2 VIEWS COMPARISON:  Portable exam 1412 hours without priors for comparison FINDINGS: Brachytherapy seed implants at prostate gland. SI joints preserved. Bones demineralized. Minimal narrowing of LEFT hip joint. Significant narrowing of RIGHT hip joint with spur formation, subchondral sclerosis, and bone-on-bone appearance. No acute fracture, dislocation, or bone destruction. Trabecular thickening at LEFT iliac bone extending into LEFT ischium, question subtle pagetoid changes. Degenerative changes at visualized lower lumbar spine. Scattered atherosclerotic calcifications. IMPRESSION: Degenerative changes of lower lumbar spine and BILATERAL hip joints RIGHT greater than LEFT. Osseous demineralization. No definite acute osseous findings. Questions Paget's disease changes of the LEFT pelvis. Electronically Signed   By: Lavonia Dana M.D.   On: 12/05/2020 14:43   DG Shoulder Left  Result Date: 12/05/2020 CLINICAL DATA:  [place].  Left shoulder pain. EXAM: LEFT SHOULDER - 2+ VIEW COMPARISON:  None. FINDINGS: Glenohumeral joint is normal. Narrowed humeral acromial distance suggesting the presence of rotator cuff disease. Chronic degenerative change of the [facility] joint. Old healed fractures of the left third and fourth ribs. IMPRESSION: No acute traumatic finding. Probable chronic rotator cuff pathology. Electronically Signed   By: Nelson Chimes M.D.   On: 12/05/2020 12:50   DG Humerus Left  Result Date: 12/05/2020 CLINICAL DATA:  [place] from bed last night, LEFT shoulder and posterior elbow pain, initial encounter EXAM: LEFT HUMERUS - 2+ VIEW COMPARISON:  None FINDINGS: Degenerative changes LEFT AC joint. Osseous demineralization. Glenohumeral and elbow joint  alignments grossly normal. No acute fracture, dislocation, or bone destruction. Approximation of humeral head with undersurface of acromion suggesting chronic rotator cuff tear. IMPRESSION: No acute osseous abnormalities. Degenerative changes LEFT AC joint with question chronic LEFT rotator cuff tear. Electronically Signed   By: Lavonia Dana M.D.   On: 12/05/2020 14:05    Procedures Procedures   Medications Ordered in ED Medications - No data to display  ED Course  I have reviewed the triage vital signs and the nursing notes.  Pertinent labs & imaging results that were available during my care of the patient were reviewed by me and considered in my medical decision making (see chart for details).    MDM Rules/Calculators/A&P                          Additional history obtained from: Nursing notes from this visit. Review of electronic medical records. ------------------------------- CT Head IMPRESSION:  No acute or traumatic finding. Mild age related volume loss and  small-vessel change of the white matter.   CT C-Spine:  IMPRESSION:  No acute or traumatic finding. Chronic spondylosis and facet  osteoarthritis. Foraminal narrowing at multiple levels as above.   DG Left Shoulder:    IMPRESSION:  No acute traumatic finding. Probable chronic rotator cuff pathology.   DG Chest/Right Ribs:  IMPRESSION:  No active cardiopulmonary disease. Heart size upper limits of  normal. Aortic atherosclerosis. No visible rib fracture on the  right. Old healed fractures of the left third and  fourth ribs.   DG Left Humerus:  IMPRESSION:  No acute osseous abnormalities.     Degenerative changes LEFT AC joint with question chronic LEFT  rotator cuff tear.   DG Left Elbow: IMPRESSION:  No acute osseous abnormalities.   DG Pelvis:   IMPRESSION:  Degenerative changes of lower lumbar spine and BILATERAL hip joints  RIGHT greater than LEFT.     Osseous demineralization.     No definite  acute osseous findings.     Questions Paget's disease changes of the LEFT pelvis.  ------------------------------ Work-up today is reassuring, suspect musculoskeletal cause of left shoulder painand for ACS, PE, dissection or other emergent cardiopulmonary causes of symptoms today.  No indication for CT of the chest abdomen or pelvis at this time.  Patient was placed in a sling to protect the left shoulder, possible rotator cuff tear when she can follow-up outpatient with orthopedist.  On-call orthopedist from [facility] is Dr. Alvan Dame, referral information was given.  Incentive spirometer given to help patient take deep breaths. -- 3:20 PM: Patient reassessed he is resting calmly bed no acute distress vital signs are stable.  I informed patient of above findings and he stated understanding.  Offered to call the patient's family to inform them work-up and pending discharge, patient reports that he will talk to his family and inform them of above.  At this time there does not appear to be any evidence of an acute emergency medical condition and the patient appears stable for discharge with appropriate outpatient follow up. Diagnosis was discussed with patient who verbalizes understanding of care plan and is agreeable to discharge. I have discussed return precautions with patient who verbalizes understanding. Patient encouraged to follow-up with their PCP and ortho. All questions answered.  Patient was seen and evaluated by attending physician Dr. Zenia Resides during this visit who agrees with work-up and discharge at this time.  Note: Portions of this report may have been transcribed using voice recognition software. Every effort was made to ensure accuracy; however, inadvertent computerized transcription errors may still be present.  Final Clinical Impression(s) / ED Diagnoses Final diagnoses:  Fall, initial encounter  Acute pain of left shoulder  Rib pain on right side    Rx / DC Orders ED Discharge  Orders          Ordered    Incentive spirometry RT        12/05/20 1512             Gari Crown 12/05/20 1514    Gari Crown 12/05/20 1521    Lacretia Leigh, MD 12/10/20 [phone]

## 2020-12-05 NOTE — Discharge Instructions (Addendum)
At this time there does not appear to be the presence of an emergent medical condition, however there is always the potential for conditions to change. Please read and follow the below instructions.  Please return to the Emergency Department immediately for any new or worsening symptoms. Please be sure to follow up with your Primary Care Provider within one week regarding your visit today; please call their office to schedule an appointment even if you are feeling better for a follow-up visit. You may call the on-call orthopedist Dr. Alvan Dame for further evaluation of your left shoulder pain.  You may use a sling to protect your left shoulder along with rest ice and elevation.  You may use the incentive spirometer given to today to help take deep breaths to avoid developing a pneumonia after your rib injury. Your x-ray of your pelvis today showed degeneration of your lower spine as well as possible Paget's disease, but discussed this with your primary care provider at your follow-up visit.  Go to the nearest Emergency Department immediately if: You have fever or chills You feel sick to your stomach (nauseous) or you throw up (vomit). You feel sweaty or light-headed. You have a cough with mucus from your lungs (sputum) or you cough up blood. You are short of breath. Your arm, hand, or fingers: Tingle. Are numb. Are swollen. Are painful. Turn white or blue. You have any new/concerning or worsening of symptoms   Please read the additional information packets attached to your discharge summary.  Do not take your medicine if  develop an itchy rash, swelling in your mouth or lips, or difficulty breathing; call 911 and seek immediate emergency medical attention if this occurs.  You may review your lab tests and imaging results in their entirety on your MyChart account.  Please discuss all results of fully with your primary care provider and other specialist at your follow-up visit.  Note: Portions  of this text may have been transcribed using voice recognition software. Every effort was made to ensure accuracy; however, inadvertent computerized transcription errors may still be present.

## 2020-12-07 ENCOUNTER — Ambulatory Visit (INDEPENDENT_AMBULATORY_CARE_PROVIDER_SITE_OTHER): Payer: Medicare Other | Admitting: Physician Assistant

## 2020-12-07 ENCOUNTER — Encounter: Payer: Self-pay | Admitting: Physician Assistant

## 2020-12-07 ENCOUNTER — Other Ambulatory Visit: Payer: Self-pay

## 2020-12-07 VITALS — BP 138/68 | HR 76 | Ht 70.0 in | Wt 176.6 lb

## 2020-12-07 DIAGNOSIS — E785 Hyperlipidemia, unspecified: Secondary | ICD-10-CM | POA: Diagnosis not present

## 2020-12-07 DIAGNOSIS — I2510 Atherosclerotic heart disease of native coronary artery without angina pectoris: Secondary | ICD-10-CM | POA: Diagnosis not present

## 2020-12-07 DIAGNOSIS — I10 Essential (primary) hypertension: Secondary | ICD-10-CM

## 2020-12-07 MED ORDER — METOPROLOL SUCCINATE ER 50 MG PO TB24: ORAL_TABLET | ORAL | 3 refills | Status: DC

## 2020-12-07 NOTE — Patient Instructions (Addendum)
Medication Instructions:  TAKE Metoprolol Succinate (Toprol-XL) to 50 mg in the mornings and 25 mg in the evenings  *If you need a refill on your cardiac medications before your next appointment, please call your pharmacy*  Lab Work: NONE ordered at this time of appointment   If you have labs (blood work) drawn today and your tests are completely normal, you will receive your results only by: [place] (if you have MyChart) OR A paper copy in the mail If you have any lab test that is abnormal or we need to change your treatment, we will call you to review the results.  Testing/Procedures: NONE ordered at this time of appointment   Follow-Up: At [facility], you and your health needs are our priority.  As part of our continuing mission to provide you with exceptional heart care, we have created designated Provider Care Teams.  These Care Teams include your primary Cardiologist (physician) and Advanced Practice Providers (APPs -  Physician Assistants and Nurse Practitioners) who all work together to provide you with the care you need, when you need it.  We recommend signing up for the patient portal called "MyChart".  Sign up information is provided on this After Visit Summary.  MyChart is used to connect with patients for Virtual Visits (Telemedicine).  Patients are able to view lab/test results, encounter notes, upcoming appointments, etc.  Non-urgent messages can be sent to your provider as well.   To learn more about what you can do with MyChart, go to [email or web address].    Your next appointment:   As scheduled    The format for your next appointment:   In Person  Provider:   Sanda Klein, MD  Other Instructions

## 2020-12-07 NOTE — Progress Notes (Signed)
Cardiology Office Note:    Date:  12/07/2020   ID:  Joel Donaldson, DOB [birth year], MRN [id number]  PCP:  [name], DO   [facility] Providers Cardiologist:  Sanda Klein, MD     Referring MD: [name], DO   Chief Complaint  Patient presents with   Follow-up    Seen for Dr. Sallyanne Kuster     History of Present Illness:    Joel Donaldson is a 80 y.o. male with a hx of CAD, hypertension, former tobacco use, rheumatoid arthritis and history of prostate cancer s/p radioactive seed implantation.  Last echocardiogram obtained on 06/28/2020 showed EF 55 to 60%, grade 1 DD, mild AI.  Coronary CT obtained on 09/05/2020 showed coronary calcium score of 1523 which placed the patient in 90th percentile for age and sex matched control, greater than 70% plaque in distal left circumflex and mild 25 to 49% plaque in RCA, LAD and OM1.  Despite the severe plaque in the distal left circumflex, FFR was actually normal suggestive of nonobstructive CAD.  Due to persistent symptoms, he ultimately underwent cardiac catheterization on 09/16/2020 which revealed 60% LPA V lesion, 30% distal left main to ostial LAD lesion.  No significant coronary artery disease noted.  Although cardiac catheterization report mentioned suspect microvascular disease.  She was last seen by Dr. Sallyanne Kuster on 10/05/2020, chest pain continue to be exertional despite absence of significant coronary artery disease.  Metoprolol was increased to 50 mg daily.  More recently, she went to the ED 2 days ago with a mechanical fall.  No acute fracture was seen on x-ray.  CT of the head and neck shows no acute traumatic finding.  Patient presents today for follow-up.  After the increase on metoprolol succinate during the last visit, his chest pain has finally resolved.  He has not had any chest pain in the past 2 months.  He says he fell because he rolled off the bed 2 days ago.  He denies any dizziness.  Blood pressure today is 138/68,  however he mentions his blood pressure is still in the 140s at home.  I decided to increase the metoprolol again to 50 mg a.m. and 25 mg p.m.  Otherwise, he has a follow-up with Dr. Sallyanne Kuster in 1 month.  He has no lower extremity edema, orthopnea or PND.  He walks with a walker and denies significant dyspnea.  Past Medical History:  Diagnosis Date   Alcohol abuse    Anxiety    Arthritis    hands, back, knees   Asthma    Depression    GERD (gastroesophageal reflux disease)    Glaucoma    Heart murmur asymptomatic   Hx of radioisotope therapy 07/26/11   Radiosactive Prostate Seed Implant/ I-125 Seeds   Hypertension    Neck pain 10/22/2019   Prostate cancer ([place]) dx'd 2016 or 2017   seed implants   Rheumatoid arthritis ([place]) 10/10/2012    Past Surgical History:  Procedure Laterality Date   CYSTOSCOPY  09/14/2011   Procedure: CYSTOSCOPY FLEXIBLE;  Surgeon: Hanley Ben, MD;  Location: [facility];  Service: Urology;  Laterality: N/A;  No seeds seen in the bladder   LEFT HEART CATH AND CORONARY ANGIOGRAPHY N/A 09/16/2020   Procedure: LEFT HEART CATH AND CORONARY ANGIOGRAPHY;  Surgeon: Leonie Man, MD;  Location: [facility];  Service: Cardiovascular;  Laterality: N/A;   [place] IMPLANT  09/14/2011   Procedure:  RADIOACTIVE SEED IMPLANT;  Surgeon: Hanley Ben, MD;  Location: [facility];  Service: Urology;  Laterality: N/A;  Total number of seeds -     SHOULDER SURGERY  YRS AGO   LEFT    Current Medications: Current Meds  Medication Sig   acetaminophen (TYLENOL) 500 MG tablet Take 1,000 mg by mouth every 8 (eight) hours as needed for mild pain or moderate pain.   albuterol (VENTOLIN HFA) 108 (90 Base) MCG/ACT inhaler Inhale 1-2 puffs into the lungs every 4 (four) hours as needed for wheezing or shortness of breath.   aspirin EC 81 MG tablet Take 81 mg by mouth every morning.    atorvastatin (LIPITOR) 20 MG tablet  Take 1 tablet (20 mg total) by mouth daily.   buprenorphine (BUTRANS) 7.5 MCG/HR Place 1 patch onto the skin every Thursday.   diclofenac sodium (VOLTAREN) 1 % GEL APPLY 2 GRAMS EXTERNALLY TO THE AFFECTED AREA FOUR TIMES DAILY AS NEEDED FOR PAIN (Patient taking differently: Apply 2 g topically See admin instructions. Apply 2 grams topically to bilateral knees one to four times a day for pain)   fluticasone (FLONASE) 50 MCG/ACT nasal spray SHAKE LIQUID AND USE 2 SPRAYS IN EACH NOSTRIL DAILY   INCRUSE ELLIPTA 62.5 MCG/INH AEPB INHALE 1 PUFF INTO THE LUNGS DAILY   lidocaine (XYLOCAINE) 5 % ointment Apply 1 application topically daily as needed for mild pain or moderate pain.   lisinopril (ZESTRIL) 20 MG tablet TAKE 2 TABLETS(40MG ) BY MOUTH DAILY   Multiple Vitamins-Minerals (ONE-A-DAY PROACTIVE 65+) TABS Take 1 tablet by mouth daily with breakfast.   nitroGLYCERIN (NITROSTAT) 0.4 MG SL tablet Place 1 tablet (0.4 mg total) under the tongue every 5 (five) minutes as needed for chest pain (chest pain).   omeprazole (PRILOSEC) 40 MG capsule Take 1 capsule (40 mg total) by mouth daily before breakfast.   oxyCODONE-acetaminophen (PERCOCET) 10-325 MG tablet Take 1 tablet by mouth every 12 (twelve) hours as needed for pain. (Patient taking differently: Take 1 tablet by mouth 3 (three) times daily as needed for pain.)   polyethylene glycol (MIRALAX / GLYCOLAX) packet Take 17 g by mouth daily as needed for mild constipation.   predniSONE (DELTASONE) 10 MG tablet Take 3 tablets (30 mg total) by mouth daily for 4 days.   [DISCONTINUED] metoprolol succinate (TOPROL-XL) 50 MG 24 hr tablet Take 1 tablet (50 mg total) by mouth daily. Take with or immediately following a meal.     Allergies:   Tramadol and Pollen extract   Social History   Socioeconomic History   Marital status: Legally Separated    Spouse name: Deneise Lever   Number of children: 1   Years of education: 11   Highest education level: Not on file   Occupational History   Occupation: Retired    Fish farm manager: RETIRED  Tobacco Use   Smoking status: Former    Packs/day: 2.00    Years: 50.00    Pack years: 100.00    Types: Cigarettes    Start date: 02/12/1953    Quit date: 10/13/2004    Years since quitting: 16.1   Smokeless tobacco: Never   Tobacco comments:    Smoked 2 ppd  Vaping Use   Vaping Use: Never used  Substance and Sexual Activity   Alcohol use: Not Currently    Alcohol/week: 14.0 standard drinks    Types: 14 Shots of liquor per week    Comment: 1/2 pint of liquor daily   Drug use: No  Sexual activity: Not Currently  Other Topics Concern   Not on file  Social History Narrative   Patient lives with his granddaughter in [place].   Patient is currently legally separated.    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    Patient enjoys seeing friends, hunting, and fishing.          Social Determinants of Health   Financial Resource Strain: Low Risk    Difficulty of Paying Living Expenses: Not hard at all  Food Insecurity: No Food Insecurity   Worried About Charity fundraiser in the Last Year: Never true   [place] in the Last Year: Never true  Transportation Needs: No Transportation Needs   Lack of Transportation (Medical): No   Lack of Transportation (Non-Medical): No  Physical Activity: Insufficiently Active   Days of Exercise per Week: 2 days   Minutes of Exercise per Session: 10 min  Stress: No Stress Concern Present   Feeling of Stress : Only a little  Social Connections: Moderately Isolated   Frequency of Communication with Friends and Family: Three times a week   Frequency of Social Gatherings with Friends and Family: More than three times a week   Attends Religious Services: More than 4 times per year   Active Member of [employer] or Organizations: No   Attends Archivist Meetings: Never   Marital Status: Separated     Family History: The patient's family history  includes Diabetes in his mother; Heart disease in his father.  ROS:   Please see the history of present illness.     All other systems reviewed and are negative.  EKGs/Labs/Other Studies Reviewed:    The following studies were reviewed today:  Echo 06/28/2020  1. Left ventricular ejection fraction, by estimation, is 55 to 60%. The  left ventricle has normal function. The left ventricle has no regional  wall motion abnormalities. There is mild left ventricular hypertrophy.  Left ventricular diastolic parameters  are consistent with Grade I diastolic dysfunction (impaired relaxation).   2. Right ventricular systolic function is normal. The right ventricular  size is normal.   3. The mitral valve is normal in structure. No evidence of mitral valve  regurgitation.   4. The aortic valve is tricuspid. Aortic valve regurgitation is mild.  Mild to moderate aortic valve sclerosis/calcification is present, without  any evidence of aortic stenosis.    Cath 09/16/2020   LPAV lesion is 60% stenosed.   Dist LM to Ost LAD lesion is 30% stenosed.   The left ventricular systolic function is normal.   SUMMARY Mild diffuse multivessel disease with no obvious significant stenosis.   Most notable stenosis is in the distal AV groove LCx roughly 60% (not positive by CT FFR). Significant systemic hypertension with normal LV EDP. Normal LV function with no obvious wall motion abnormality Suspect MICROVASCULAR DISEASE - ([place])  EKG:  EKG is not ordered today.    Recent Labs: 12/14/2019: BNP 106.9 05/20/2020: ALT 32 09/12/2020: Hemoglobin 12.9; Platelets 245 10/03/2020: BUN 11; Creatinine, Ser 0.81; Potassium 3.9; Sodium 142  Recent Lipid Panel    Component Value Date/Time   CHOL 156 05/21/2020 0130   CHOL 170 07/30/2017 1425   TRIG 50 05/21/2020 0130   HDL 72 05/21/2020 0130   HDL 43 07/30/2017 1425   CHOLHDL 2.2 05/21/2020 0130   VLDL 10 05/21/2020 0130   LDLCALC 74 05/21/2020 0130   LDLCALC  80 07/30/2017 1425   LDLDIRECT  99 06/24/2007 2025     Risk Assessment/Calculations:           Physical Exam:    VS:  BP 138/68 (BP Location: Left Arm, Patient Position: Sitting, Cuff Size: Normal)   Pulse 76   Ht 5\' 10"  (1.778 m)   Wt 176 lb 9.6 oz (80.1 kg)   SpO2 97%   BMI 25.34 kg/m     Wt Readings from Last 3 Encounters:  12/07/20 176 lb 9.6 oz (80.1 kg)  10/05/20 177 lb (80.3 kg)  10/03/20 176 lb 12.8 oz (80.2 kg)     GEN:  Well nourished, well developed in no acute distress HEENT: Normal NECK: No JVD; No carotid bruits LYMPHATICS: No lymphadenopathy CARDIAC: RRR, no murmurs, rubs, gallops RESPIRATORY:  Clear to auscultation without rales, wheezing or rhonchi  ABDOMEN: Soft, non-tender, non-distended MUSCULOSKELETAL:  No edema; No deformity  SKIN: Warm and dry NEUROLOGIC:  Alert and oriented x 3 PSYCHIATRIC:  Normal affect   ASSESSMENT:    1. Coronary artery disease involving native coronary artery of native heart without angina pectoris   2. Hyperlipidemia LDL goal <70   3. Essential hypertension    PLAN:    In order of problems listed above:  CAD   -Since beta-blocker was increased during last admission, his exertional chest pain has finally resolved.  He has not had any further chest discomfort in the past 2 months.  Continue aspirin and statin  Hyperlipidemia: Continue Lipitor 20 mg daily  Hypertension: Blood pressure still borderline elevated in the 130s to 140s range at home.  We will add extra half a tablet of metoprolol succinate at night.  He will take 50 mg a.m. and 25 mg p.m.  Continue on lisinopril.        Medication Adjustments/Labs and Tests Ordered: Current medicines are reviewed at length with the patient today.  Concerns regarding medicines are outlined above.  No orders of the defined types were placed in this encounter.  Meds ordered this encounter  Medications   metoprolol succinate (TOPROL-XL) 50 MG 24 hr tablet    Sig: Take  50 mg in the morning and 25 mg (half tablet) in the evenings    Dispense:  135 tablet    Refill:  3    Dose change new Rx    Patient Instructions  Medication Instructions:  TAKE Metoprolol Succinate (Toprol-XL) to 50 mg in the mornings and 25 mg in the evenings  *If you need a refill on your cardiac medications before your next appointment, please call your pharmacy*  Lab Work: NONE ordered at this time of appointment   If you have labs (blood work) drawn today and your tests are completely normal, you will receive your results only by: MyChart Message (if you have MyChart) OR A paper copy in the mail If you have any lab test that is abnormal or we need to change your treatment, we will call you to review the results.  Testing/Procedures: NONE ordered at this time of appointment   Follow-Up: At [facility], you and your health needs are our priority.  As part of our continuing mission to provide you with exceptional heart care, we have created designated Provider Care Teams.  These Care Teams include your primary Cardiologist (physician) and Advanced Practice Providers (APPs -  Physician Assistants and Nurse Practitioners) who all work together to provide you with the care you need, when you need it.  We recommend signing up for the patient portal called "  MyChart".  Sign up information is provided on this After Visit Summary.  MyChart is used to connect with patients for Virtual Visits (Telemedicine).  Patients are able to view lab/test results, encounter notes, upcoming appointments, etc.  Non-urgent messages can be sent to your provider as well.   To learn more about what you can do with MyChart, go to [email or web address].    Your next appointment:   As scheduled    The format for your next appointment:   In Person  Provider:   Sanda Klein, MD  Other Instructions    Signed, Almyra Deforest, [place]  12/07/2020 9:52 AM    [place]

## 2020-12-12 DIAGNOSIS — G894 Chronic pain syndrome: Secondary | ICD-10-CM | POA: Diagnosis not present

## 2020-12-12 DIAGNOSIS — Z79899 Other long term (current) drug therapy: Secondary | ICD-10-CM | POA: Diagnosis not present

## 2020-12-12 DIAGNOSIS — G8929 Other chronic pain: Secondary | ICD-10-CM | POA: Diagnosis not present

## 2020-12-12 DIAGNOSIS — M545 Low back pain, unspecified: Secondary | ICD-10-CM | POA: Diagnosis not present

## 2020-12-12 DIAGNOSIS — M546 Pain in thoracic spine: Secondary | ICD-10-CM | POA: Diagnosis not present

## 2020-12-16 DIAGNOSIS — M25562 Pain in left knee: Secondary | ICD-10-CM | POA: Diagnosis not present

## 2020-12-16 DIAGNOSIS — R0781 Pleurodynia: Secondary | ICD-10-CM | POA: Diagnosis not present

## 2020-12-16 DIAGNOSIS — M25512 Pain in left shoulder: Secondary | ICD-10-CM | POA: Diagnosis not present

## 2020-12-16 DIAGNOSIS — M25561 Pain in right knee: Secondary | ICD-10-CM | POA: Diagnosis not present

## 2021-01-04 ENCOUNTER — Other Ambulatory Visit: Payer: Self-pay | Admitting: Family Medicine

## 2021-01-10 ENCOUNTER — Encounter: Payer: Self-pay | Admitting: Family Medicine

## 2021-01-10 ENCOUNTER — Other Ambulatory Visit: Payer: Self-pay

## 2021-01-10 ENCOUNTER — Ambulatory Visit (INDEPENDENT_AMBULATORY_CARE_PROVIDER_SITE_OTHER): Payer: Medicare Other | Admitting: Family Medicine

## 2021-01-10 VITALS — BP 150/78 | HR 86 | Ht 70.0 in | Wt 183.5 lb

## 2021-01-10 DIAGNOSIS — H269 Unspecified cataract: Secondary | ICD-10-CM | POA: Insufficient documentation

## 2021-01-10 DIAGNOSIS — G8929 Other chronic pain: Secondary | ICD-10-CM | POA: Diagnosis not present

## 2021-01-10 DIAGNOSIS — K219 Gastro-esophageal reflux disease without esophagitis: Secondary | ICD-10-CM

## 2021-01-10 DIAGNOSIS — H259 Unspecified age-related cataract: Secondary | ICD-10-CM | POA: Diagnosis not present

## 2021-01-10 DIAGNOSIS — M545 Low back pain, unspecified: Secondary | ICD-10-CM

## 2021-01-10 DIAGNOSIS — C61 Malignant neoplasm of prostate: Secondary | ICD-10-CM

## 2021-01-10 DIAGNOSIS — I10 Essential (primary) hypertension: Secondary | ICD-10-CM

## 2021-01-10 DIAGNOSIS — J449 Chronic obstructive pulmonary disease, unspecified: Secondary | ICD-10-CM | POA: Diagnosis not present

## 2021-01-10 DIAGNOSIS — E7800 Pure hypercholesterolemia, unspecified: Secondary | ICD-10-CM

## 2021-01-10 MED ORDER — ALBUTEROL SULFATE HFA 108 (90 BASE) MCG/ACT IN AERS: 1.0000 | INHALATION_SPRAY | RESPIRATORY_TRACT | 3 refills | Status: DC | PRN

## 2021-01-10 MED ORDER — FLUTICASONE PROPIONATE 50 MCG/ACT NA SUSP: NASAL | 3 refills | Status: DC

## 2021-01-10 MED ORDER — OMEPRAZOLE 40 MG PO CPDR: DELAYED_RELEASE_CAPSULE | ORAL | 2 refills | Status: DC

## 2021-01-10 MED ORDER — LISINOPRIL 20 MG PO TABS: ORAL_TABLET | ORAL | 1 refills | Status: DC

## 2021-01-10 MED ORDER — LIDOCAINE 5 % EX OINT: 1.0000 "application " | TOPICAL_OINTMENT | Freq: Every day | CUTANEOUS | 1 refills | Status: AC | PRN

## 2021-01-10 MED ORDER — INCRUSE ELLIPTA 62.5 MCG/ACT IN AEPB: 1.0000 | INHALATION_SPRAY | Freq: Every day | RESPIRATORY_TRACT | 2 refills | Status: DC

## 2021-01-10 MED ORDER — DICLOFENAC SODIUM 1 % EX GEL: 4.0000 g | Freq: Four times a day (QID) | CUTANEOUS | 1 refills | Status: AC

## 2021-01-10 MED ORDER — ATORVASTATIN CALCIUM 20 MG PO TABS: 20.0000 mg | ORAL_TABLET | Freq: Every day | ORAL | 3 refills | Status: DC

## 2021-01-10 MED ORDER — POLYETHYLENE GLYCOL 3350 17 G PO PACK: 17.0000 g | PACK | Freq: Every day | ORAL | 0 refills | Status: AC | PRN

## 2021-01-10 NOTE — Assessment & Plan Note (Signed)
Advised pt to follow up with urologist.

## 2021-01-10 NOTE — Assessment & Plan Note (Signed)
BP 150/78.  Refilled Lisinopril 40 mg (2-20 mg tablets). August BMP reviewed. Pt to get labs at his cardiology appointment on Thursday.

## 2021-01-10 NOTE — Assessment & Plan Note (Signed)
Chronic, stable. Refilled omeprazole.

## 2021-01-10 NOTE — Patient Instructions (Signed)
It was great seeing you today!  Please check-out at the front desk before leaving the clinic. I'd like to see you back in 4 months but if you need to be seen earlier than that for any new issues we're happy to fit you in, just give [place] a call!  Visit Remembers: - Stop by the pharmacy to pick up your prescriptions  - Continue to work on your healthy eating habits and incorporating exercise into your daily life.  - Your goal is to have an BP < 140/90 - Medicine Changes: none - A referral to an eye doctor was placed be call us in 2 weeks if you have not been contacted about an appointment.   Regarding lab work today: I will follow up on your lab work that your cardiologist gets on Thursday   Please bring all of your medications with you to each visit.    Feel free to call with any questions or concerns at any time, at [phone].   Take care,  Dr. Rushie [facility]

## 2021-01-10 NOTE — Assessment & Plan Note (Addendum)
Refilled Lipitor. Last LDL 74 in April 2022.

## 2021-01-10 NOTE — Progress Notes (Signed)
   SUBJECTIVE:   CHIEF COMPLAINT / HPI:   Chief Complaint  Patient presents with   Medication Refill     Joel Donaldson is a 80 y.o. male here for follow up of his chronic medical conditions. Would like to see a eye doctor about his cataracts as he would now like cataract surgery.  Has no other concerns. Denies chest pain, current leg swelling. Had some shortness of breath this morning and used his inhalers. Has a good appetite. Back pain controlled.    PERTINENT  PMH / PSH: reviewed and updated as appropriate   OBJECTIVE:   BP (!) 150/78   Pulse 86   Ht 5\' 10"  (1.778 m)   Wt 183 lb 8 oz (83.2 kg)   SpO2 97%   BMI 26.33 kg/m    GEN: pleasant well appearing male, in no acute distress  CV: regular rate and rhythm RESP: no increased work of breathing, clear to ascultation bilaterally MSK: no LE edema, walking cane present in the room  SKIN: warm, dry, hypopigmentation event on face     ASSESSMENT/PLAN:   COPD (chronic obstructive pulmonary disease) (HCC) Stable. Refilled Incruse ellipta and albuterol.    Essential hypertension BP 150/78.  Refilled Lisinopril 40 mg (2-20 mg tablets). August BMP reviewed. Pt to get labs at his cardiology appointment on Thursday.   Back pain Chronic and stable. Refilled lidocaine gel and voltaren gel. Continue TID Tylenol.   Hyperlipidemia Refilled Lipitor. Last LDL 74 in April 2022.   GASTROESOPHAGEAL REFLUX, NO ESOPHAGITIS Chronic, stable. Refilled omeprazole.   Prostate cancer ([place]) Advised pt to follow up with urologist.   Cataract Known left cataract. Patient would like to pursue surgery now. Referral placed for ophthalmology.    Continue to discuss Shingles and Covid booster at follow up.   [name], DO   PGY-3, [facility] 01/10/2021

## 2021-01-10 NOTE — Assessment & Plan Note (Signed)
Chronic and stable. Refilled lidocaine gel and voltaren gel. Continue TID Tylenol.

## 2021-01-10 NOTE — Assessment & Plan Note (Signed)
Stable. Refilled Incruse ellipta and albuterol.

## 2021-01-10 NOTE — Assessment & Plan Note (Signed)
Known left cataract. Patient would like to pursue surgery now. Referral placed for ophthalmology.

## 2021-01-12 ENCOUNTER — Ambulatory Visit: Payer: Medicare Other | Admitting: Cardiovascular Disease

## 2021-01-17 ENCOUNTER — Emergency Department (HOSPITAL_COMMUNITY): Payer: Medicare Other

## 2021-01-17 ENCOUNTER — Encounter (HOSPITAL_COMMUNITY): Payer: Self-pay | Admitting: Emergency Medicine

## 2021-01-17 ENCOUNTER — Other Ambulatory Visit: Payer: Self-pay

## 2021-01-17 ENCOUNTER — Observation Stay (HOSPITAL_COMMUNITY)
Admission: EM | Admit: 2021-01-17 | Discharge: 2021-01-18 | Disposition: A | Discharge status: Home / Self Care | Payer: Medicare Other | Attending: Family Medicine | Admitting: Family Medicine

## 2021-01-17 DIAGNOSIS — Z79899 Other long term (current) drug therapy: Secondary | ICD-10-CM | POA: Insufficient documentation

## 2021-01-17 DIAGNOSIS — J449 Chronic obstructive pulmonary disease, unspecified: Secondary | ICD-10-CM | POA: Insufficient documentation

## 2021-01-17 DIAGNOSIS — Z20822 Contact with and (suspected) exposure to covid-19: Secondary | ICD-10-CM | POA: Diagnosis not present

## 2021-01-17 DIAGNOSIS — Z8546 Personal history of malignant neoplasm of prostate: Secondary | ICD-10-CM | POA: Diagnosis not present

## 2021-01-17 DIAGNOSIS — I2510 Atherosclerotic heart disease of native coronary artery without angina pectoris: Secondary | ICD-10-CM | POA: Insufficient documentation

## 2021-01-17 DIAGNOSIS — Z87891 Personal history of nicotine dependence: Secondary | ICD-10-CM | POA: Insufficient documentation

## 2021-01-17 DIAGNOSIS — R079 Chest pain, unspecified: Principal | ICD-10-CM | POA: Diagnosis present

## 2021-01-17 DIAGNOSIS — Z7982 Long term (current) use of aspirin: Secondary | ICD-10-CM | POA: Insufficient documentation

## 2021-01-17 DIAGNOSIS — I10 Essential (primary) hypertension: Secondary | ICD-10-CM | POA: Insufficient documentation

## 2021-01-17 LAB — CBC WITH DIFFERENTIAL/PLATELET
Abs Immature Granulocytes: 0.02 10*3/uL (ref 0.00–0.07)
Basophils Absolute: 0 10*3/uL (ref 0.0–0.1)
Basophils Relative: 1 %
Eosinophils Absolute: 0.1 10*3/uL (ref 0.0–0.5)
Eosinophils Relative: 2 %
HCT: 39.8 % (ref 39.0–52.0)
Hemoglobin: 12.6 g/dL — ABNORMAL LOW (ref 13.0–17.0)
Immature Granulocytes: 1 %
Lymphocytes Relative: 19 %
Lymphs Abs: 0.8 10*3/uL (ref 0.7–4.0)
MCH: 27.7 pg (ref 26.0–34.0)
MCHC: 31.7 g/dL (ref 30.0–36.0)
MCV: 87.5 fL (ref 80.0–100.0)
Monocytes Absolute: 0.5 10*3/uL (ref 0.1–1.0)
Monocytes Relative: 13 %
Neutro Abs: 2.7 10*3/uL (ref 1.7–7.7)
Neutrophils Relative %: 64 %
Platelets: 205 10*3/uL (ref 150–400)
RBC: 4.55 MIL/uL (ref 4.22–5.81)
RDW: 16.2 % — ABNORMAL HIGH (ref 11.5–15.5)
WBC: 4.1 10*3/uL (ref 4.0–10.5)
nRBC: 0 % (ref 0.0–0.2)

## 2021-01-17 LAB — COMPREHENSIVE METABOLIC PANEL
ALT: 12 U/L (ref 0–44)
AST: 15 U/L (ref 15–41)
Albumin: 3.5 g/dL (ref 3.5–5.0)
Alkaline Phosphatase: 65 U/L (ref 38–126)
Anion gap: 8 (ref 5–15)
BUN: 10 mg/dL (ref 8–23)
CO2: 21 mmol/L — ABNORMAL LOW (ref 22–32)
Calcium: 8.6 mg/dL — ABNORMAL LOW (ref 8.9–10.3)
Chloride: 108 mmol/L (ref 98–111)
Creatinine, Ser: 0.78 mg/dL (ref 0.61–1.24)
GFR, Estimated: >60 mL/min (ref >60)
Glucose, Bld: 93 mg/dL (ref 70–99)
Potassium: 3.7 mmol/L (ref 3.5–5.1)
Sodium: 137 mmol/L (ref 135–145)
Total Bilirubin: 0.3 mg/dL (ref 0.3–1.2)
Total Protein: 6.2 g/dL — ABNORMAL LOW (ref 6.5–8.1)

## 2021-01-17 LAB — TROPONIN I (HIGH SENSITIVITY)
Troponin I (High Sensitivity): 8 ng/L (ref <18)
Troponin I (High Sensitivity): 9 ng/L (ref <18)

## 2021-01-17 MED ORDER — ENOXAPARIN SODIUM 40 MG/0.4ML IJ SOSY
40.0000 mg | PREFILLED_SYRINGE | Freq: Every day | INTRAMUSCULAR | Status: DC
  Administered 2021-01-17: 40 mg via SUBCUTANEOUS
  Filled 2021-01-17: qty 0.4

## 2021-01-17 MED ORDER — ATORVASTATIN CALCIUM 10 MG PO TABS
20.0000 mg | ORAL_TABLET | Freq: Every day | ORAL | Status: DC
  Administered 2021-01-18: 20 mg via ORAL
  Filled 2021-01-17: qty 2

## 2021-01-17 MED ORDER — FLUTICASONE PROPIONATE 50 MCG/ACT NA SUSP
2.0000 | Freq: Every day | NASAL | Status: DC
  Filled 2021-01-17: qty 16

## 2021-01-17 MED ORDER — LISINOPRIL 20 MG PO TABS
40.0000 mg | ORAL_TABLET | Freq: Every day | ORAL | Status: DC
  Administered 2021-01-18: 40 mg via ORAL
  Filled 2021-01-17: qty 2

## 2021-01-17 MED ORDER — ONDANSETRON HCL 4 MG/2ML IJ SOLN: 4.0000 mg | Freq: Four times a day (QID) | INTRAMUSCULAR | Status: DC | PRN

## 2021-01-17 MED ORDER — ASPIRIN EC 81 MG PO TBEC
81.0000 mg | DELAYED_RELEASE_TABLET | ORAL | Status: DC
  Administered 2021-01-18: 81 mg via ORAL
  Filled 2021-01-17: qty 1

## 2021-01-17 MED ORDER — PANTOPRAZOLE SODIUM 40 MG PO TBEC
80.0000 mg | DELAYED_RELEASE_TABLET | Freq: Every day | ORAL | Status: DC
  Administered 2021-01-18: 80 mg via ORAL
  Filled 2021-01-17: qty 2

## 2021-01-17 MED ORDER — MORPHINE SULFATE (PF) 4 MG/ML IV SOLN: 4.0000 mg | Freq: Once | INTRAVENOUS | Status: DC

## 2021-01-17 MED ORDER — ONDANSETRON HCL 4 MG/2ML IJ SOLN: 4.0000 mg | Freq: Once | INTRAMUSCULAR | Status: DC

## 2021-01-17 MED ORDER — NITROGLYCERIN 0.4 MG SL SUBL
0.4000 mg | SUBLINGUAL_TABLET | SUBLINGUAL | Status: DC | PRN
  Administered 2021-01-17 (×2): 0.4 mg via SUBLINGUAL
  Filled 2021-01-17: qty 1

## 2021-01-17 MED ORDER — ACETAMINOPHEN 325 MG PO TABS
650.0000 mg | ORAL_TABLET | ORAL | Status: DC | PRN
  Administered 2021-01-18: 650 mg via ORAL
  Filled 2021-01-17: qty 2

## 2021-01-17 MED ORDER — UMECLIDINIUM BROMIDE 62.5 MCG/ACT IN AEPB
1.0000 | INHALATION_SPRAY | Freq: Every day | RESPIRATORY_TRACT | Status: DC
  Filled 2021-01-17: qty 7

## 2021-01-17 MED ORDER — METOPROLOL SUCCINATE ER 25 MG PO TB24
50.0000 mg | ORAL_TABLET | Freq: Every day | ORAL | Status: DC
  Filled 2021-01-17: qty 2

## 2021-01-17 NOTE — H&P (Addendum)
[facility] Admission History and Physical Service Pager: [phone]  Patient name: Joel Donaldson Medical record number: [id number] Date of birth: 03-Jul-1940 Age: 80 y.o. Gender: male  Primary Care Provider: [name], DO Consultants: None Code Status: Full  Preferred Emergency Contact: Renella Cunas (neighbor) [phone]  Chief Complaint: Chest Pain  Assessment and Plan: Joel Donaldson is an 80 y.o. male presenting with chest pain. PMH is significant for CAD, HTN, COPD, GERD, Prostate CA, rheumatoid arthritis, hx of etOH abuse, quit 1 year ago.   Chest Pain  ACS Rule Out CAD Vitals on admission significant only for hypertension to 163/92 and intermittent bradycardia to high 50s. Trops 8>9. ECG with normal sinus rhythm and no changes compared to previous tracing. L Heart cath in August with 60% stenosis of circumflex and 30% distal LAD lesion. No stenting at that time.  Chest pain responded rapidly to nitroglycerin administration. Asymptomatic at this time.  Concern for ACS given patient's history and response to nitroglycerin. However, reassured given negative troponin and no ischemic changes on ECG. No evidence of GI or pulmonary pathology contributing to presentation. Could consider MSK etiology as he has some tenderness to left chest wall on exam. Vital signs, troponins, ECG, and overall clinical picture all reassuring, but given patient's history, will admit for overnight observation. Hopeful for discharge home tomorrow if no recurrence of chest pain.   - Admit to cardiac tele Dr. Erin Hearing attending - Will contact Victoria in the morning for further recs - Cont aspirin 81mg  daily - Will hold patient's evening metoprolol dose due to bradycardia, restart in am - Nitroglycerin prn for chest pain - If chest pain recurs, get repeat ECG  Hypertension BPs 130s-160s/70s-90s.  - Continue home lisinopril 40mg  daily  COPD On room air and  without wheezing on exam. Unlikely to be a major contributing factor to presentation.  - Continue home Incruse 1 puff daily - Continue home Fluticasone 2 sprays per nare daily  HLD Currently takes Lipitor 20mg  daily. Last lipid panel in April 2022 with LDL 74.  - Continue Lipitor 20mg  daily - AM lipid panel  GERD Patient takes Prilosec 40mg  daily at home - Protonix 80mg  daily  FEN/GI: Heart healthy diet Prophylaxis: Lovenox  Disposition: Cardiac tele  History of Present Illness:  Joel Donaldson is a 80 y.o. male presenting for rule out ACS.   Patient reports he developed chest pain yesterday after walking to his mailbox. Improved after taking aspirin and nitroglycerin at home.  Today had another episode of chest pain. He had been grocery shopping prior to the onset of pain, but the pain began when he was at rest. Pain described as a throbbing that radiated to his left arm. Also had dizziness and shortness of breath. No nausea or vomiting. He took Tylenol and Aspirin, which seemed to help a little bit.  Otherwise has been in his usual state of health- denies recent fever, chills, cough, abdominal pain, no leg swelling or weight gain, no urinary symptoms. Some nasal congestion over the past 1 week. Endorses excellent compliance with his medications- no recent med changes. Last medication change was October 2022 when his metoprolol was increased from 50mg  daily to 50mg  in the morning and 25mg  in the evening.  Checks BP regularly at home-- typically 130s/70s.  Patient denies chest pain at the present time. Reports it resolved after taking the nitroglycerin in the ED.  No alcohol use (last drink 1 year ago), former tobacco use (  quit 15 years ago), no recreational drug use.   Review Of Systems: Per HPI with the following additions:   Review of Systems  Constitutional:  Negative for chills and fever.  Respiratory:  Positive for shortness of breath (resolved).   Cardiovascular:   Positive for chest pain. Negative for leg swelling.  Gastrointestinal:  Negative for abdominal pain, constipation, diarrhea, nausea and vomiting.  Genitourinary:  Negative for difficulty urinating and frequency.  Skin:  Negative for rash.  Neurological:  Positive for dizziness (resolved).  All other systems reviewed and are negative.   Patient Active Problem List   Diagnosis Date Noted   Chest pain 01/17/2021   Age-related cataract of left eye 01/10/2021   Cataract 01/10/2021   Abnormal cardiac CT angiography 09/16/2020   Phlegm in throat 08/17/2020   Epigastric pain 05/20/2020   Atherosclerosis of aorta ([place]) 05/20/2020   Tachycardia    Opioid dependence ([place]) 11/29/2017   Essential hypertension    Chest pain at rest 10/04/2016   Baker's cyst of knee, right 06/21/2016   Right leg swelling 06/14/2016   Back pain 09/19/2015   Unspecified vitamin D deficiency 07/07/2013   GERD (gastroesophageal reflux disease) 07/03/2013   Rheumatoid arthritis ([place]) 10/10/2012   Osteoarthritis of both shoulders 02/15/2012   Tendinopathy of rotator cuff 01/20/2012   Atypical angina ([place]) (Class III) 12/19/2011   Preventative health care 12/03/2011   Osteoarthritis of both knees 07/02/2011   Alcohol use 05/01/2010   INTENTION TREMOR 09/09/2008   Prostate cancer ([place]) 07/01/2008   Hyperlipidemia 06/24/2007   COPD (chronic obstructive pulmonary disease) ([place]) 05/12/2007   GASTROESOPHAGEAL REFLUX, NO ESOPHAGITIS 04/11/2006    Past Medical History: Past Medical History:  Diagnosis Date   Alcohol abuse    Anxiety    Arthritis    hands, back, knees   Asthma    Depression    GERD (gastroesophageal reflux disease)    Glaucoma    Heart murmur asymptomatic   Hx of radioisotope therapy 07/26/11   Radiosactive Prostate Seed Implant/ I-125 Seeds   Hypertension    Neck pain 10/22/2019   Prostate cancer ([place]) dx'd 2016 or 2017   seed implants   Rheumatoid arthritis ([place]) 10/10/2012    Past  Surgical History: Past Surgical History:  Procedure Laterality Date   CYSTOSCOPY  09/14/2011   Procedure: CYSTOSCOPY FLEXIBLE;  Surgeon: Hanley Ben, MD;  Location: [place];  Service: Urology;  Laterality: N/A;  No seeds seen in the bladder   LEFT HEART CATH AND CORONARY ANGIOGRAPHY N/A 09/16/2020   Procedure: LEFT HEART CATH AND CORONARY ANGIOGRAPHY;  Surgeon: Leonie Man, MD;  Location: [place] [facility];  Service: Cardiovascular;  Laterality: N/A;   [place] IMPLANT  09/14/2011   Procedure: RADIOACTIVE SEED IMPLANT;  Surgeon: Hanley Ben, MD;  Location: [place];  Service: Urology;  Laterality: N/A;  Total number of seeds -     SHOULDER SURGERY  YRS AGO   LEFT    Social History: Social History   Tobacco Use   Smoking status: Former    Packs/day: 2.00    Years: 50.00    Pack years: 100.00    Types: Cigarettes    Start date: 02/12/1953    Quit date: 10/13/2004    Years since quitting: 16.2   Smokeless tobacco: Never   Tobacco comments:    Smoked 2 ppd  Vaping Use   Vaping Use: Never used  Substance Use  Topics   Alcohol use: Not Currently    Alcohol/week: 14.0 standard drinks    Types: 14 Shots of liquor per week    Comment: 1/2 pint of liquor daily   Drug use: No     Family History: Family History  Problem Relation Age of Onset   Diabetes Mother    Heart disease Father        lived to 59 - had bypass, pacemaker    Allergies and Medications: Allergies  Allergen Reactions   Tramadol Nausea Only   Pollen Extract Other (See Comments)    Sneezing   No current facility-administered medications on file prior to encounter.   Current Outpatient Medications on File Prior to Encounter  Medication Sig Dispense Refill   acetaminophen (TYLENOL) 500 MG tablet Take 1,000 mg by mouth every 8 (eight) hours as needed for mild pain or moderate pain.     albuterol (VENTOLIN HFA) 108 (90 Base) MCG/ACT  inhaler Inhale 1-2 puffs into the lungs every 4 (four) hours as needed for wheezing or shortness of breath. 8.5 g 3   aspirin EC 81 MG tablet Take 81 mg by mouth every morning.      atorvastatin (LIPITOR) 20 MG tablet Take 1 tablet (20 mg total) by mouth daily. 90 tablet 3   buprenorphine (BUTRANS) 7.5 MCG/HR Place 1 patch onto the skin every Thursday.     diclofenac Sodium (VOLTAREN) 1 % GEL Apply 4 g topically 4 (four) times daily. 350 g 1   fluticasone (FLONASE) 50 MCG/ACT nasal spray SHAKE LIQUID AND USE 2 SPRAYS IN EACH NOSTRIL DAILY 16 g 3   lidocaine (XYLOCAINE) 5 % ointment Apply 1 application topically daily as needed for mild pain or moderate pain. 35.44 g 1   lisinopril (ZESTRIL) 20 MG tablet TAKE 2 TABLETS(40MG ) BY MOUTH DAILY 180 tablet 1   metoprolol succinate (TOPROL-XL) 50 MG 24 hr tablet Take 50 mg in the morning and 25 mg (half tablet) in the evenings 135 tablet 3   Multiple Vitamins-Minerals (ONE-A-DAY PROACTIVE 65+) TABS Take 1 tablet by mouth daily with breakfast.     nitroGLYCERIN (NITROSTAT) 0.4 MG SL tablet Place 1 tablet (0.4 mg total) under the tongue every 5 (five) minutes as needed for chest pain (chest pain). 20 tablet 0   omeprazole (PRILOSEC) 40 MG capsule TAKE 1 CAPSULE(40 MG) BY MOUTH DAILY BEFORE BREAKFAST 90 capsule 2   polyethylene glycol (MIRALAX / GLYCOLAX) 17 g packet Take 17 g by mouth daily as needed for mild constipation. 14 each 0   umeclidinium bromide (INCRUSE ELLIPTA) 62.5 MCG/ACT AEPB Inhale 1 puff into the lungs daily. 30 each 2    Objective: BP 132/79   Pulse (!) 59   Temp 98.7 F (37.1 C) (Oral)   Resp 16   SpO2 100%  Exam: General: Lying in ED stretcher, NAD, comfortable-appearing Eyes: EOMs intact, sclearae anicteric, conjunctivae clear ENTM: MMM Neck: No mass or thyromegaly Cardiovascular: bradycardic, without murmur, rub, or gallop  Respiratory: Normal WOB on RA, diminished lung sounds in bilateral bases, crackles on L  base Gastrointestinal: Bowel sounds nl, non-tender, non-distended MSK: Mild TTP of left anterior and lateral chest. Enlarged MCP joints of BL hands, consistent with rheumatoid arthritis Derm: Warm and dry, no rash Neuro: A&O, no focal deficits Psych: Mood and affect normal  Labs and Imaging: CBC BMET  Recent Labs  Lab 01/17/21 1828  WBC 4.1  HGB 12.6*  HCT 39.8  PLT 205   Recent Labs  Lab 01/17/21 1828  NA 137  K 3.7  CL 108  CO2 21*  BUN 10  CREATININE 0.78  GLUCOSE 93  CALCIUM 8.6*     EKG: NSR, QTc 457, stable from previous tracings  DG Chest Port 1 View CLINICAL DATA:  Chest pain  EXAM: PORTABLE CHEST 1 VIEW  COMPARISON:  12/05/2020  FINDINGS: Lungs volumes are small. There is mild bibasilar atelectasis or infiltrate. No pneumothorax or pleural effusion. Cardiac size within normal limits. Pulmonary vascularity is normal. Osseous structures are age-appropriate. No acute bone abnormality.  IMPRESSION: Pulmonary hypoinflation. Superimposed mild bibasilar atelectasis or infiltrate.  Electronically Signed   By: Fidela Salisbury M.D.   On: 01/17/2021 19:10   Eppie Gibson, MD 01/17/2021, 10:09 PM PGY-1, [place] Intern pager: [phone], text pages welcome  FPTS Upper-Level Resident Addendum   I have independently interviewed and examined the patient. I have discussed the above with Dr. Joelyn Oms and agree with the documented plan. My edits for correction/addition/clarification are included above. Please see any attending notes.   Alcus Dad, MD PGY-2, [place] [facility] 01/18/2021 12:29 AM  FPTS Service pager: [phone] (text pages welcome through [facility]

## 2021-01-17 NOTE — Hospital Course (Addendum)
Brief Hospital Course Mr. Pryer is an 80 yo male with a history of CAD, COPD, prostate CA, and rheumatoid arthritis who presented to the [facility] on 12/6 for chest pain at rest. Vitals on admission were stable and troponins negative. ECG showed no ischemic changes and patient's pain abated quickly with administration of nitroglycerin. Given patient's history of CAD and no clear etiology for his symptoms, decision was made to admit him for observation. Cardiology was consulted and recommended initiation of amlodipine 5 mg daily, outpatient follow up, and subsequently cleared patient for discharge. Patient is medically stable to be discharged home.   Items for PCP Follow-up Follow up with cardiologist, Dr. Sallyanne Kuster for evaluation  Evaluate for HTN with initiation of amlodipine, and continuing home anti-hypertensive medications.

## 2021-01-17 NOTE — ED Triage Notes (Signed)
Pt BIB [facility] from home, c/o intermittent chest pain since yesterday morning, with shortness of breath. Per pt, pain usually resolves with NTG. Reports he has felt more anxious than normal. 324mg  asa pta. Rates pain 4/10, describes as dull.

## 2021-01-17 NOTE — ED Notes (Signed)
Received verbal report from Bailey B RN at this time 

## 2021-01-17 NOTE — ED Provider Notes (Signed)
[facility] EMERGENCY DEPARTMENT Provider Note   CSN: [id number] Arrival date & time: 01/17/21  1802     History Chief Complaint  Patient presents with   Chest Pain    Joel Donaldson is a 80 y.o. male.  Patient is an 80 year old male with a history of rheumatoid arthritis, asthma, prior prostate cancer and recent visits this year for chest pain who had a coronary CT that showed disease that did not appear to be completely significant but he continued to have pain and ended up undergoing catheterization in August 2022 and at that time was not found to have significant disease.  However he continued to have chest pain and followed up with cardiology and had metoprolol increased and patient reports really since August after they increased his metoprolol he has not been experiencing chest pain until yesterday.  He reported he went out to the mailbox and when he came back he had a sharp dull pain in the left side of his chest that made him feel lightheaded and he took an aspirin and nitroglycerin x2 and his pain resolved.  Today he went to the grocery store and he was feeling okay until he got home and then started having the pain again around 5 PM.  He reported when the pain came on it went into his left shoulder and made him feel lightheaded and short of breath.  He called the ambulance and when they got there he took 4 aspirin.  He reports the pain is still present and he still feels it in his arm but he no longer feels short of breath or dizzy.  He denies any recent heartburn or pain related to eating.  He has not had cough, congestion, fever.  He has been taking all of his medications as prescribed.  The history is provided by the patient and medical records.  Chest Pain     Past Medical History:  Diagnosis Date   Alcohol abuse    Anxiety    Arthritis    hands, back, knees   Asthma    Depression    GERD (gastroesophageal reflux disease)    Glaucoma    Heart murmur  asymptomatic   Hx of radioisotope therapy 07/26/11   Radiosactive Prostate Seed Implant/ I-125 Seeds   Hypertension    Neck pain 10/22/2019   Prostate cancer ([place]) dx'd 2016 or 2017   seed implants   Rheumatoid arthritis ([name]) 10/10/2012    Patient Active Problem List   Diagnosis Date Noted   Age-related cataract of left eye 01/10/2021   Cataract 01/10/2021   Abnormal cardiac CT angiography 09/16/2020   Phlegm in throat 08/17/2020   Epigastric pain 05/20/2020   Atherosclerosis of aorta ([place]) 05/20/2020   Tachycardia    Opioid dependence ([place]) 11/29/2017   Essential hypertension    Chest pain at rest 10/04/2016   Baker's cyst of knee, right 06/21/2016   Right leg swelling 06/14/2016   Back pain 09/19/2015   Unspecified vitamin D deficiency 07/07/2013   GERD (gastroesophageal reflux disease) 07/03/2013   Rheumatoid arthritis ([place]) 10/10/2012   Osteoarthritis of both shoulders 02/15/2012   Tendinopathy of rotator cuff 01/20/2012   Atypical angina ([place]) (Class III) 12/19/2011   Preventative health care 12/03/2011   Osteoarthritis of both knees 07/02/2011   Alcohol use 05/01/2010   INTENTION TREMOR 09/09/2008   Prostate cancer ([place]) 07/01/2008   Hyperlipidemia 06/24/2007   COPD (chronic obstructive pulmonary disease) ([place]) 05/12/2007   GASTROESOPHAGEAL REFLUX, NO  ESOPHAGITIS 04/11/2006    Past Surgical History:  Procedure Laterality Date   CYSTOSCOPY  09/14/2011   Procedure: CYSTOSCOPY FLEXIBLE;  Surgeon: Hanley Ben, MD;  Location: [place];  Service: Urology;  Laterality: N/A;  No seeds seen in the bladder   LEFT HEART CATH AND CORONARY ANGIOGRAPHY N/A 09/16/2020   Procedure: LEFT HEART CATH AND CORONARY ANGIOGRAPHY;  Surgeon: Leonie Man, MD;  Location: [place] [facility];  Service: Cardiovascular;  Laterality: N/A;   [place] IMPLANT  09/14/2011   Procedure: RADIOACTIVE SEED IMPLANT;  Surgeon: Hanley Ben, MD;   Location: [place];  Service: Urology;  Laterality: N/A;  Total number of seeds -     SHOULDER SURGERY  YRS AGO   LEFT       Family History  Problem Relation Age of Onset   Diabetes Mother    Heart disease Father        lived to 39 - had bypass, pacemaker    Social History   Tobacco Use   Smoking status: Former    Packs/day: 2.00    Years: 50.00    Pack years: 100.00    Types: Cigarettes    Start date: 02/12/1953    Quit date: 10/13/2004    Years since quitting: 16.2   Smokeless tobacco: Never   Tobacco comments:    Smoked 2 ppd  Vaping Use   Vaping Use: Never used  Substance Use Topics   Alcohol use: Not Currently    Alcohol/week: 14.0 standard drinks    Types: 14 Shots of liquor per week    Comment: 1/2 pint of liquor daily   Drug use: No    Home Medications Prior to Admission medications   Medication Sig Start Date End Date Taking? Authorizing Provider  acetaminophen (TYLENOL) 500 MG tablet Take 1,000 mg by mouth every 8 (eight) hours as needed for mild pain or moderate pain.    [provider]  albuterol (VENTOLIN HFA) 108 (90 Base) MCG/ACT inhaler Inhale 1-2 puffs into the lungs every 4 (four) hours as needed for wheezing or shortness of breath. 01/10/21   [name], DO  aspirin EC 81 MG tablet Take 81 mg by mouth every morning.     [provider]  atorvastatin (LIPITOR) 20 MG tablet Take 1 tablet (20 mg total) by mouth daily. 01/10/21 04/10/21  [name], DO  buprenorphine (BUTRANS) 7.5 MCG/HR Place 1 patch onto the skin every Thursday. 05/17/20   [provider]  diclofenac Sodium (VOLTAREN) 1 % GEL Apply 4 g topically 4 (four) times daily. 01/10/21   Brimage, Ronnette Juniper, DO  fluticasone (FLONASE) 50 MCG/ACT nasal spray SHAKE LIQUID AND USE 2 SPRAYS IN EACH NOSTRIL DAILY 01/10/21   Brimage, Vondra, DO  lidocaine (XYLOCAINE) 5 % ointment Apply 1 application topically daily as needed for mild pain or moderate pain.  01/10/21   [name], DO  lisinopril (ZESTRIL) 20 MG tablet TAKE 2 TABLETS(40MG ) BY MOUTH DAILY 01/10/21   [name], DO  metoprolol succinate (TOPROL-XL) 50 MG 24 hr tablet Take 50 mg in the morning and 25 mg (half tablet) in the evenings 12/07/20   Almyra Deforest, PA  Multiple Vitamins-Minerals (ONE-A-DAY PROACTIVE 65+) TABS Take 1 tablet by mouth daily with breakfast.    [provider]  nitroGLYCERIN (NITROSTAT) 0.4 MG SL tablet Place 1 tablet (0.4 mg total) under the tongue every 5 (five) minutes as needed for chest pain (  chest pain). 05/21/20   Simmons-Robinson, Makiera, MD  omeprazole (PRILOSEC) 40 MG capsule TAKE 1 CAPSULE(40 MG) BY MOUTH DAILY BEFORE BREAKFAST 01/10/21   Brimage, Vondra, DO  polyethylene glycol (MIRALAX / GLYCOLAX) 17 g packet Take 17 g by mouth daily as needed for mild constipation. 01/10/21   Brimage, Ronnette Juniper, DO  umeclidinium bromide (INCRUSE ELLIPTA) 62.5 MCG/ACT AEPB Inhale 1 puff into the lungs daily. 01/10/21   [name], DO    Allergies    Tramadol and Pollen extract  Review of Systems   Review of Systems  Cardiovascular:  Positive for chest pain.  All other systems reviewed and are negative.  Physical Exam Updated Vital Signs BP 137/74   Pulse (!) 58   Temp 98.7 F (37.1 C) (Oral)   Resp 18   SpO2 99%   Physical Exam Vitals and nursing note reviewed.  Constitutional:      General: He is not in acute distress.    Appearance: He is well-developed.  HENT:     Head: Normocephalic and atraumatic.     Mouth/Throat:     Mouth: Mucous membranes are moist.  Eyes:     Conjunctiva/sclera: Conjunctivae normal.     Pupils: Pupils are equal, round, and reactive to light.  Cardiovascular:     Rate and Rhythm: Normal rate and regular rhythm.     Pulses: Normal pulses.     Heart sounds: No murmur heard. Pulmonary:     Effort: Pulmonary effort is normal. No respiratory distress.     Breath sounds: Normal breath sounds. No wheezing or  rales.  Abdominal:     General: There is no distension.     Palpations: Abdomen is soft.     Tenderness: There is no abdominal tenderness. There is no guarding or rebound.  Musculoskeletal:        General: No tenderness. Normal range of motion.     Cervical back: Normal range of motion and neck supple.     Right lower leg: No edema.     Left lower leg: No edema.  Skin:    General: Skin is warm and dry.     Findings: No erythema or rash.  Neurological:     Mental Status: He is alert and oriented to person, place, and time. Mental status is at baseline.  Psychiatric:        Mood and Affect: Mood normal.        Behavior: Behavior normal.    ED Results / Procedures / Treatments   Labs (all labs ordered are listed, but only abnormal results are displayed) Labs Reviewed  CBC WITH DIFFERENTIAL/PLATELET - Abnormal; Notable for the following components:      Result Value   Hemoglobin 12.6 (*)    RDW 16.2 (*)    All other components within normal limits  COMPREHENSIVE METABOLIC PANEL  TROPONIN I (HIGH SENSITIVITY)    EKG EKG Interpretation  Date/Time:  Tuesday January 17 2021 18:09:52 EST Ventricular Rate:  62 PR Interval:  174 QRS Duration: 97 QT Interval:  450 QTC Calculation: 457 R Axis:   -9 Text Interpretation: Sinus rhythm No significant change since last tracing Confirmed by Blanchie Dessert ([id number]) on 01/17/2021 6:19:09 PM  Radiology DG Chest Port 1 View  Result Date: 01/17/2021 CLINICAL DATA:  Chest pain EXAM: PORTABLE CHEST 1 VIEW COMPARISON:  12/05/2020 FINDINGS: Lungs volumes are small. There is mild bibasilar atelectasis or infiltrate. No pneumothorax or pleural effusion. Cardiac size within normal limits. Pulmonary vascularity is  normal. Osseous structures are age-appropriate. No acute bone abnormality. IMPRESSION: Pulmonary hypoinflation. Superimposed mild bibasilar atelectasis or infiltrate. Electronically Signed   By: Fidela Salisbury M.D.   On: 01/17/2021 19:10     Procedures Procedures   Medications Ordered in ED Medications  nitroGLYCERIN (NITROSTAT) SL tablet 0.4 mg (has no administration in time range)    ED Course  I have reviewed the triage vital signs and the nursing notes.  Pertinent labs & imaging results that were available during my care of the patient were reviewed by me and considered in my medical decision making (see chart for details).    MDM Rules/Calculators/A&P                           Patient is an elderly male with significant medical past presenting today with chest pain.  He currently is still having pain despite 324 of aspirin he took prior to arrival.  Does have a history of having catheterization earlier this year but did not receive any stents.  It has been managed with metoprolol which has been helping until yesterday when his pain restarted.  EKG does not show any significant findings for ischemia.  He is currently still having chest pain and denies any symptoms consistent with infectious etiology or GI etiology.  Lower suspicion for PE.  Patient will be given nitroglycerin and labs are pending.  9:51 PM Pt still having pain and reports a 4/10.  On further evaluation pt never received the NTG that was ordered.  Will give now.  Labs reassuring and neg trop.  However given ongoing pain, hx and age will admit for r/o.  MDM   Amount and/or Complexity of Data Reviewed Clinical lab tests: ordered and reviewed Tests in the radiology section of CPT: ordered and reviewed Tests in the medicine section of CPT: ordered and reviewed Independent visualization of images, tracings, or specimens: yes     Final Clinical Impression(s) / ED Diagnoses Final diagnoses:  Chest pain, unspecified type    Rx / DC Orders ED Discharge Orders     None        Blanchie Dessert, MD 01/17/21 2154

## 2021-01-18 ENCOUNTER — Other Ambulatory Visit (HOSPITAL_COMMUNITY): Payer: Self-pay

## 2021-01-18 DIAGNOSIS — E785 Hyperlipidemia, unspecified: Secondary | ICD-10-CM | POA: Diagnosis not present

## 2021-01-18 DIAGNOSIS — I25118 Atherosclerotic heart disease of native coronary artery with other forms of angina pectoris: Secondary | ICD-10-CM | POA: Diagnosis not present

## 2021-01-18 DIAGNOSIS — R079 Chest pain, unspecified: Secondary | ICD-10-CM | POA: Diagnosis not present

## 2021-01-18 DIAGNOSIS — R0989 Other specified symptoms and signs involving the circulatory and respiratory systems: Secondary | ICD-10-CM

## 2021-01-18 LAB — LIPID PANEL
Cholesterol: 145 mg/dL (ref 0–200)
HDL: 46 mg/dL (ref >40)
LDL Cholesterol: 88 mg/dL (ref 0–99)
Total CHOL/HDL Ratio: 3.2 RATIO
Triglycerides: 53 mg/dL (ref <150)
VLDL: 11 mg/dL (ref 0–40)

## 2021-01-18 LAB — RESP PANEL BY RT-PCR (FLU A&B, COVID) ARPGX2
Influenza A by PCR: NEGATIVE
Influenza B by PCR: NEGATIVE
SARS Coronavirus 2 by RT PCR: NEGATIVE

## 2021-01-18 MED ORDER — AMLODIPINE BESYLATE 5 MG PO TABS
5.0000 mg | ORAL_TABLET | Freq: Every day | ORAL | Status: DC
  Administered 2021-01-18: 5 mg via ORAL
  Filled 2021-01-18: qty 1

## 2021-01-18 MED ORDER — METOPROLOL SUCCINATE ER 50 MG PO TB24
50.0000 mg | ORAL_TABLET | Freq: Every day | ORAL | 0 refills | Status: DC
  Filled 2021-01-18: qty 30, 30d supply, fill #0

## 2021-01-18 MED ORDER — LISINOPRIL 20 MG PO TABS: 40.0000 mg | ORAL_TABLET | Freq: Every day | ORAL | Status: DC

## 2021-01-18 MED ORDER — AMLODIPINE BESYLATE 5 MG PO TABS
5.0000 mg | ORAL_TABLET | Freq: Every day | ORAL | 0 refills | Status: DC
  Filled 2021-01-18: qty 30, 30d supply, fill #0

## 2021-01-18 NOTE — Discharge Instructions (Signed)
You were hospitalized due to acute chest pain and were not found to have an emergent cardiac issue. Cardiology evaluated you while you were here and has adjusted your medications and will follow-up with you outpatient. We are starting a new blood pressure medication called amlodipine so keep an eye on your blood pressures until you are able to follow-up with your physician.

## 2021-01-18 NOTE — Discharge Summary (Signed)
[facility] Discharge Summary  Patient name: Joel Donaldson Medical record number: [id number] Date of birth: [birth year] Age: 80 y.o. Gender: male Date of Admission: 01/17/2021  Date of Discharge: 01/18/2021 Admitting Physician: Lind Covert, MD  Primary Care Provider: [name], DO Consultants: Cardiology  Indication for Hospitalization: ACS  Discharge Diagnoses/Problem List:  Principal Problem:   Chest pain    Disposition: home  Discharge Condition: medically stable   Discharge Exam:  Blood pressure (!) 141/81, pulse 70, temperature 97.7 F (36.5 C), temperature source Oral, resp. rate 16, height 5\' 10"  (1.778 m), weight 83.2 kg, SpO2 93 %.  General: Patient is well appearing in NAD  Cardio: regular rate and rhythm  Pulm: respiratory effort normal. lungs clear, with no wheezing, crackles, or rhonchi  Abdominal: soft, non-tender, no guarding or palpable masses  Extremities: no peripheral edema Skin: no rashes or lacerations  Neuro: alert and oriented x 4, no focal deficits, no facial droop  Brief Hospital Course:  [place] [facility] Course Joel Donaldson is an 80 yo male with a history of CAD, COPD, prostate CA, and rheumatoid arthritis who presented to the [facility] on 12/6 for chest pain at rest. Vitals on admission were stable and troponins negative. ECG showed no ischemic changes and patient's pain abated quickly with administration of nitroglycerin. Given patient's history of CAD and no clear etiology for his symptoms, decision was made to admit him for observation. Cardiology was consulted and recommended initiation of amlodipine 5 mg daily, outpatient follow up, and subsequently cleared patient for discharge. Patient is medically stable to be discharged home.   Items for PCP Follow-up Follow up with cardiologist, Dr. Sallyanne Kuster for further evaluation  Monitor BP, patient was started on Amlodipine 5mg  and metoprolol dose was decreased to 50mg   daily. If blood pressure is lower and patient symptomatic then consider decreasing lisinopril dose to 20mg   Significant Procedures: none  Significant Labs and Imaging:  Recent Labs  Lab 01/17/21 1828  WBC 4.1  HGB 12.6*  HCT 39.8  PLT 205   Recent Labs  Lab 01/17/21 1828  NA 137  K 3.7  CL 108  CO2 21*  GLUCOSE 93  BUN 10  CREATININE 0.78  CALCIUM 8.6*  ALKPHOS 65  AST 15  ALT 12  ALBUMIN 3.5    CXR 12/6  FINDINGS: Lungs volumes are small. There is mild bibasilar atelectasis or infiltrate. No pneumothorax or pleural effusion. Cardiac size within normal limits. Pulmonary vascularity is normal. Osseous structures are age-appropriate. No acute bone abnormality. IMPRESSION: Pulmonary hypoinflation. Superimposed mild bibasilar atelectasis or infiltrate.  ECG 12/6 Sinus rhythm with no changes from the last ECG.   Results/Tests Pending at Time of Discharge: none  Discharge Medications:  Allergies as of 01/18/2021       Reactions   Tramadol Nausea Only   Pollen Extract Other (See Comments)   Sneezing        Medication List     TAKE these medications    acetaminophen 500 MG tablet Commonly known as: TYLENOL Take 1,000 mg by mouth every 8 (eight) hours as needed for mild pain or moderate pain.   albuterol 108 (90 Base) MCG/ACT inhaler Commonly known as: VENTOLIN HFA Inhale 1-2 puffs into the lungs every 4 (four) hours as needed for wheezing or shortness of breath.   amLODipine 5 MG tablet Commonly known as: NORVASC Take 1 tablet (5 mg total) by mouth daily.   aspirin EC 81 MG tablet Take 81 mg  by mouth every morning.   atorvastatin 20 MG tablet Commonly known as: LIPITOR Take 1 tablet (20 mg total) by mouth daily.   buprenorphine 7.5 MCG/HR Commonly known as: BUTRANS Place 1 patch onto the skin every Thursday.   diclofenac Sodium 1 % Gel Commonly known as: VOLTAREN Apply 4 g topically 4 (four) times daily.   fluticasone 50 MCG/ACT nasal  spray Commonly known as: FLONASE SHAKE LIQUID AND USE 2 SPRAYS IN EACH NOSTRIL DAILY   Incruse Ellipta 62.5 MCG/ACT Aepb Generic drug: umeclidinium bromide Inhale 1 puff into the lungs daily.   lidocaine 5 % ointment Commonly known as: XYLOCAINE Apply 1 application topically daily as needed for mild pain or moderate pain.   lisinopril 20 MG tablet Commonly known as: ZESTRIL TAKE 2 TABLETS(40MG ) BY MOUTH DAILY   metoprolol succinate 50 MG 24 hr tablet Commonly known as: TOPROL-XL Take 1 tablet (50 mg total) by mouth daily. Start taking on: January 19, 2021 What changed:  how much to take how to take this when to take this additional instructions   nitroGLYCERIN 0.4 MG SL tablet Commonly known as: NITROSTAT Place 1 tablet (0.4 mg total) under the tongue every 5 (five) minutes as needed for chest pain (chest pain).   omeprazole 40 MG capsule Commonly known as: PRILOSEC TAKE 1 CAPSULE(40 MG) BY MOUTH DAILY BEFORE BREAKFAST   One-A-Day Proactive 65+ Tabs Take 1 tablet by mouth daily with breakfast.   polyethylene glycol 17 g packet Commonly known as: MIRALAX / GLYCOLAX Take 17 g by mouth daily as needed for mild constipation.        Discharge Instructions: Please refer to Patient Instructions section of EMR for full details.  Patient was counseled important signs and symptoms that should prompt return to medical care, changes in medications, dietary instructions, activity restrictions, and follow up appointments.   Follow-Up Appointments: Future Appointments  Date Time Provider [place]  01/27/2021  2:30 PM Croitoru, Dani Gobble, MD [facility]    Darci Current, Medical Student 01/18/2021, 12:17 PM OMS4, [facility]      FPTS Upper-Level Resident Addendum   I have independently interviewed and examined the patient. I have discussed the above with the original author and agree with their documentation. My edits for  correction/addition/clarification are in within the document. Please see also any attending notes.   Rise Patience, DO  PGY-2, [facility] 01/18/2021 1:48 PM  FPTS Service pager: [phone] (text pages welcome through [facility])

## 2021-01-18 NOTE — Progress Notes (Addendum)
Family Medicine Teaching Service Daily Progress Note Intern Pager: [phone]  Patient name: Joel Donaldson Medical record number: [id number] Date of birth: 05-Apr-1940 Age: 80 y.o. Gender: male  Primary Care Provider: [name], DO Consultants: None Code Status: FULL  Pt Overview and Major Events to Date:  Admitted to [facility] 11/6  Assessment and Plan: Joel Donaldson is an 80 y.o. male presenting with chest pain. PMH is significant for CAD, HTN, COPD, GERD, Prostate CA, rheumatoid arthritis, hx of etOH abuse, quit 1 year ago.    Chest Pain  ACS Rule Out  CAD Patient denies recurring CP or SOB. He has no new complaints today. Regular rate and rhythm on cardiac exam. Bradycardia resolved overnight.  - contact [facility] for further recommendations  - PRN Nitro for CP - Cont aspirin 81 mg daily  - restart metoprolol, bradycardia resolved  HTN Systolic [phone] this morning, will restart lisinopril.  - cont lisinopril 40 mg  HLD Stable. LDL 88 on 01/18/2021, goal <70. - cont Lipitor 20 mg  - consider adding Zetia outpatient   FEN/GI: heart healthy  PPx: Lovenox  Dispo: d/c home   Subjective:  Patient is resting comfortably with no new complaints. He had no CP or SOB overnight. Patient is medically stable for discharge pending cardiology recommendations.   Objective: Temp:  [98.7 F (37.1 C)] 98.7 F (37.1 C) (12/06 1808) Pulse Rate:  [51-77] 55 (12/07 0600) Resp:  [14-25] 14 (12/07 0600) BP: (102-170)/(62-92) 170/82 (12/07 0600) SpO2:  [92 %-100 %] 100 % (12/07 0600) Weight:  [83.2 kg] 83.2 kg (12/06 2318) Physical Exam: General: resting comfortably, NAD  Cardiovascular: regular rate and rhythm, no murmur appreciated  Respiratory: clear, no wheezing or crackles  Abdomen: soft, non-tender  Extremities: no peripheral edema   Laboratory: Recent Labs  Lab 01/17/21 1828  WBC 4.1  HGB 12.6*  HCT 39.8  PLT 205   Recent Labs  Lab 01/17/21 1828  NA 137   K 3.7  CL 108  CO2 21*  BUN 10  CREATININE 0.78  CALCIUM 8.6*  PROT 6.2*  BILITOT 0.3  ALKPHOS 65  ALT 12  AST 15  GLUCOSE 93    Darci Current, Medical Student 01/18/2021, 6:50 AM OMS4, [facility] Intern pager: [phone], text pages welcome  Resident Attestation  I saw and evaluated the patient, performing the key elements of the service.I  personally performed or re-performed the history, physical exam, and medical decision making activities of this service and have verified that the service and findings are accurately documented in the student's note. I developed the management plan that is described in the medical student's note, and I agree with the content, with my edits above.    Gifford Shave, PGY3

## 2021-01-18 NOTE — Consult Note (Addendum)
Cardiology Consultation:   Patient ID: Joel Donaldson MRN: [id number]; DOB: [birth year]  Admit date: 01/17/2021 Date of Consult: 01/18/2021  PCP:  [name], DO   [facility] Providers Cardiologist:  Sanda Klein, MD   {  Patient Profile:   Joel Donaldson is a 80 y.o. male with a hx of non obstructive CAD, HTN, HLD, RA, GERD, COPD and hx of prostate cancer s/p radioactive seed implantation who is being seen 01/18/2021 for the evaluation of chest pain at the request of Dr. Erin Hearing.  Last echocardiogram obtained on 06/28/2020 showed EF 55 to 60%, grade 1 DD, mild AI.    Coronary CT obtained on 09/05/2020 showed coronary calcium score of 1523 which placed the patient in 90th percentile for age and sex matched control, greater than 70% plaque in distal left circumflex and mild 25 to 49% plaque in RCA, LAD and OM1.  Despite the severe plaque in the distal left circumflex, FFR was actually normal suggestive of nonobstructive CAD.    Due to persistent symptoms, he ultimately underwent cardiac catheterization on 09/16/2020 which revealed 60% LPA V lesion, 30% distal left main to ostial LAD lesion.  No significant coronary artery disease noted.  Although cardiac catheterization report mentioned suspect microvascular disease.   Seen by Dr. Sallyanne Kuster on 10/05/2020, chest pain continue to be exertional despite absence of significant coronary artery disease.  Metoprolol was increased to 50 mg daily.   Last seen by Almyra Deforest 12/07/20. No further exertional chest pain. Given minimally elevated blood pressure his Toprol XL increased to 50mg  AM and 25mg  PM.   History of Present Illness:   [name] was doing well on metoprolol without chest pain since last office visit.  However, he had an episode of chest pain on Monday after walking to mail box.  Associated with shortness of breath and hot feeling.  It radiated to his left arm.  He took sublingual nitroglycerin x2 with resolution of pain.  He  felt better and able to do daily routine without any problem. He went to grocery store in the morning and did well. However he had a recurrent pain evening while sitting.  He Took aspirin and Tylenol without improvement.  EMS was called >> BP of 210/110 on arrival>>  given sublingual nitroglycerin x 2 in ER with resolution of chest pain.  No reoccurrence.  His metoprolol was held due to heart rate in 50s.  Patient denies palpitation, dizziness, orthopnea, PND, syncope or lower extremity edema.  Reports compliance with his medication.  BP on arrival 163/92  Hs-troponin - negative x 2 K 3.7 Scr 0.78 LDL 88 Hgb 12.6 Chest x-ray: Pulmonary hypoinflation. Superimposed mild bibasilar atelectasis or infiltrate.  Past Medical History:  Diagnosis Date   Alcohol abuse    Anxiety    Arthritis    hands, back, knees   Asthma    Depression    GERD (gastroesophageal reflux disease)    Glaucoma    Heart murmur asymptomatic   Hx of radioisotope therapy 07/26/11   Radiosactive Prostate Seed Implant/ I-125 Seeds   Hypertension    Neck pain 10/22/2019   Prostate cancer ([place]) dx'd 2016 or 2017   seed implants   Rheumatoid arthritis ([place]) 10/10/2012    Past Surgical History:  Procedure Laterality Date   CYSTOSCOPY  09/14/2011   Procedure: CYSTOSCOPY FLEXIBLE;  Surgeon: Hanley Ben, MD;  Location: [place];  Service: Urology;  Laterality: N/A;  No seeds seen in the bladder  LEFT HEART CATH AND CORONARY ANGIOGRAPHY N/A 09/16/2020   Procedure: LEFT HEART CATH AND CORONARY ANGIOGRAPHY;  Surgeon: Leonie Man, MD;  Location: [place] [facility];  Service: Cardiovascular;  Laterality: N/A;   [place] IMPLANT  09/14/2011   Procedure: RADIOACTIVE SEED IMPLANT;  Surgeon: Hanley Ben, MD;  Location: [place];  Service: Urology;  Laterality: N/A;  Total number of seeds -     SHOULDER SURGERY  YRS AGO   LEFT    Inpatient  Medications: Scheduled Meds:  aspirin EC  81 mg Oral BH-q7a   atorvastatin  20 mg Oral Daily   enoxaparin (LOVENOX) injection  40 mg Subcutaneous QHS   fluticasone  2 spray Each Nare Daily   lisinopril  40 mg Oral Daily   metoprolol succinate  50 mg Oral Daily   pantoprazole  80 mg Oral Daily   umeclidinium bromide  1 puff Inhalation Daily   Continuous Infusions:  PRN Meds: acetaminophen, nitroGLYCERIN, ondansetron (ZOFRAN) IV  Allergies:    Allergies  Allergen Reactions   Tramadol Nausea Only   Pollen Extract Other (See Comments)    Sneezing    Social History:   Social History   Socioeconomic History   Marital status: Legally Separated    Spouse name: Deneise Lever   Number of children: 1   Years of education: 11   Highest education level: Not on file  Occupational History   Occupation: Retired    Fish farm manager: RETIRED  Tobacco Use   Smoking status: Former    Packs/day: 2.00    Years: 50.00    Pack years: 100.00    Types: Cigarettes    Start date: 02/12/1953    Quit date: 10/13/2004    Years since quitting: 16.2   Smokeless tobacco: Never   Tobacco comments:    Smoked 2 ppd  Vaping Use   Vaping Use: Never used  Substance and Sexual Activity   Alcohol use: Not Currently    Alcohol/week: 14.0 standard drinks    Types: 14 Shots of liquor per week    Comment: 1/2 pint of liquor daily   Drug use: No   Sexual activity: Not Currently  Other Topics Concern   Not on file  Social History Narrative   Patient lives with his granddaughter in [place].   Patient is currently legally separated.    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    Patient enjoys seeing friends, hunting, and fishing.          Social Determinants of Health   Financial Resource Strain: Low Risk    Difficulty of Paying Living Expenses: Not hard at all  Food Insecurity: No Food Insecurity   Worried About Charity fundraiser in the Last Year: Never true   [place] in the  Last Year: Never true  Transportation Needs: No Transportation Needs   Lack of Transportation (Medical): No   Lack of Transportation (Non-Medical): No  Physical Activity: Insufficiently Active   Days of Exercise per Week: 2 days   Minutes of Exercise per Session: 10 min  Stress: No Stress Concern Present   Feeling of Stress : Only a little  Social Connections: Moderately Isolated   Frequency of Communication with Friends and Family: Three times a week   Frequency of Social Gatherings with Friends and Family: More than three times a week   Attends Religious Services: More than 4 times per year  Active Member of Clubs or Organizations: No   Attends Archivist Meetings: Never   Marital Status: Separated  Intimate Partner Violence: Not At Risk   Fear of Current or Ex-Partner: No   Emotionally Abused: No   Physically Abused: No   Sexually Abused: No    Family History:   Family History  Problem Relation Age of Onset   Diabetes Mother    Heart disease Father        lived to 23 - had bypass, pacemaker     ROS:  Please see the history of present illness.  All other ROS reviewed and negative.     Physical Exam/Data:   Vitals:   01/18/21 0500 01/18/21 0600 01/18/21 0900 01/18/21 0915  BP: (!) 164/90 (!) 170/82 133/83 133/83  Pulse: (!) 59 (!) 55 84 (!) 58  Resp: (!) 21 14 (!) 25   Temp:      TempSrc:      SpO2: 98% 100% 91%   Weight:      Height:       No intake or output data in the 24 hours ending 01/18/21 1027 Last 3 Weights 01/17/2021 01/10/2021 12/07/2020  Weight (lbs) 183 lb 8 oz 183 lb 8 oz 176 lb 9.6 oz  Weight (kg) 83.235 kg 83.235 kg 80.105 kg     Body mass index is 26.33 kg/m.  General:  Well nourished, well developed, in no acute distress HEENT: normal Neck: no JVD Vascular: No carotid bruits; Distal pulses 2+ bilaterally Cardiac:  normal S1, S2; RRR; no murmur  Lungs:  clear to auscultation bilaterally, no wheezing, rhonchi or rales  Abd: soft,  nontender, no hepatomegaly  Ext: no edema Musculoskeletal:  No deformities, BUE and BLE strength normal and equal Skin: warm and dry  Neuro:  CNs 2-12 intact, no focal abnormalities noted Psych:  Normal affect   EKG:  The EKG was personally reviewed and demonstrates:  Sinus rhythm at rate of 62 bpm Telemetry:  Telemetry was personally reviewed and demonstrates:  sinus rhythm/bradycardia HR 50-60s  Relevant CV Studies:  LEFT HEART CATH AND CORONARY ANGIOGRAPHY  09/16/2020   Conclusion      LPAV lesion is 60% stenosed.   Dist LM to Ost LAD lesion is 30% stenosed.   The left ventricular systolic function is normal.   SUMMARY Mild diffuse multivessel disease with no obvious significant stenosis.   Most notable stenosis is in the distal AV groove LCx roughly 60% (not positive by CT FFR). Significant systemic hypertension with normal LV EDP. Normal LV function with no obvious wall motion abnormality Suspect MICROVASCULAR DISEASE - ([place])   Diagnostic Dominance: Left   Echo 06/28/2020  1. Left ventricular ejection fraction, by estimation, is 55 to 60%. The  left ventricle has normal function. The left ventricle has no regional  wall motion abnormalities. There is mild left ventricular hypertrophy.  Left ventricular diastolic parameters  are consistent with Grade I diastolic dysfunction (impaired relaxation).   2. Right ventricular systolic function is normal. The right ventricular  size is normal.   3. The mitral valve is normal in structure. No evidence of mitral valve  regurgitation.   4. The aortic valve is tricuspid. Aortic valve regurgitation is mild.  Mild to moderate aortic valve sclerosis/calcification is present, without  any evidence of aortic stenosis.    Laboratory Data:  High Sensitivity Troponin:   Recent Labs  Lab 01/17/21 1828 01/17/21 2100  TROPONINIHS 8 9     Chemistry  Recent Labs  Lab 01/17/21 1828  NA 137  K 3.7  CL 108  CO2 21*  GLUCOSE 93   BUN 10  CREATININE 0.78  CALCIUM 8.6*  GFRNONAA >60  ANIONGAP 8    Recent Labs  Lab 01/17/21 1828  PROT 6.2*  ALBUMIN 3.5  AST 15  ALT 12  ALKPHOS 65  BILITOT 0.3   Lipids  Recent Labs  Lab 01/18/21 0434  CHOL 145  TRIG 53  HDL 46  LDLCALC 88  CHOLHDL 3.2    Hematology Recent Labs  Lab 01/17/21 1828  WBC 4.1  RBC 4.55  HGB 12.6*  HCT 39.8  MCV 87.5  MCH 27.7  MCHC 31.7  RDW 16.2*  PLT 205   Radiology/Studies:  DG Chest Port 1 View  Result Date: 01/17/2021 CLINICAL DATA:  Chest pain EXAM: PORTABLE CHEST 1 VIEW COMPARISON:  12/05/2020 FINDINGS: Lungs volumes are small. There is mild bibasilar atelectasis or infiltrate. No pneumothorax or pleural effusion. Cardiac size within normal limits. Pulmonary vascularity is normal. Osseous structures are age-appropriate. No acute bone abnormality. IMPRESSION: Pulmonary hypoinflation. Superimposed mild bibasilar atelectasis or infiltrate. Electronically Signed   By: Fidela Salisbury M.D.   On: 01/17/2021 19:10     Assessment and Plan:   Chest pain with history of nonobstructive CAD -Patient has done well on metoprolol for past few months.  However, reoccurring chest pain with associated shortness of breath and possible radiation.  Response seems to sublingual nitroglycerin.  He was found hypertensive on EMS arrival yesterday.  Intermittently elevated blood pressure here. -Cardiac catheterization in August 2022 showed mild diffuse nonobstructive disease with most notable stenosis is in the distal AV groove LCx roughly 60% (not positive by CT FFR).  It was suspected that patient has microvascular disease. -Patient has ruled out for ACS.  No subjective evidence of ischemia. troponin negative.  EKG without acute ischemic changes. -He is appropriate for medical therapy. -We will add amlodipine Imdur for antianginal -Given bradycardia will reduce metoprolol succinate to 50 mg daily from 50 mg a.m. and 25 mg p.m.  2. HLD -  01/18/2021: Cholesterol 145; HDL 46; LDL Cholesterol 88; Triglycerides 53; VLDL 11  -Increase Lipitor to 40 mg daily  3.  Hypertension -Hypotensive on EMS arrival.  Here blood pressure intermittently elevated. -His lisinopril has resumed -Reduce metoprolol succinate as above -Add amlodipine or Imdur for antianginal and additional blood pressure support   Dr. Claiborne Billings to see. Will add / change meds after discussion with MD. No currently plan for any ischemic evaluation. Likely medical management and follow up with Dr. Sallyanne Kuster 01/27/21 as scheduled.      Risk Assessment/Risk Scores:   HEAR Score (for undifferentiated chest pain):  HEAR Score: 6{ \ For questions or updates, please contact [place] Please consult [email or web address] for contact info under    Jarrett Soho, PA  01/18/2021 10:27 AM    Patient seen and examined. Agree with assessment and plan. Mr.  Anastacio Bua in is an 80 year old gentleman by Dr. Sallyanne Kuster.  The patient has a history of hypertension, hyperlipidemia, GERD, COPD and prostate cancer status post radioactive seed implantation.  He has been documented to have mild CAD with 60% stenosis in a distal branch of the circumflex vessel posterolaterally which was not hemodynamically significant by FFR.  He was felt possibly to have a component of microvascular angina.  He has a history of somewhat labile hypertension and recently his metoprolol succinate dose was increased from 50 mg daily  to 50 mg in the morning and 25 mg at night.  Patient has experienced some recurrent episodes of chest pain which did improve following sublingual nitroglycerin.  Presently, he is pain-free.  His pulse is 58 and stable.  ECG does not show acute ST segment changes with  T wave inversion in lead III.  Troponins are negative.  He is euvolemic on exam.  There is no JVD.  Lungs were clear.  Rhythm is regular without ectopy.  Abdomen is nontender.  There is no edema.  Hemoglobin is  stable at 12.6.  Acid 3.7.  Chest x-ray revealed possible mild bibasilar atelectasis.  LDL cholesterol 88.  Presently, I feel the patient is cardiac stable; however with recent blood pressure lability and potential for microvascular angina I would recommend initiation of amlodipine 5 mg daily to give first dose now.  This will be beneficial both for optimal blood pressure control as well as coronary vasodilatation.  His pulse is stable and can continue present dose of metoprolol.  If blood pressure becomes lower lisinopril dose can be reduced to 20 mg.  From our cardiac perspective, the patient possibly can go home later this afternoon.  He has an appointment to see his primary cardiologist, Dr. Sallyanne Kuster next week for follow-up evaluation.   Troy Sine, MD, [facility] 01/18/2021 11:43 AM

## 2021-01-18 NOTE — ED Notes (Signed)
Breakfast orders placed 

## 2021-01-18 NOTE — ED Notes (Signed)
Patient verbalizes understanding of discharge instructions. Opportunity for questioning and answers were provided. Armband removed by staff, pt discharged from ED via wheelchair to lobby to wait for family to go home.

## 2021-01-25 ENCOUNTER — Telehealth: Payer: Self-pay

## 2021-01-25 NOTE — Telephone Encounter (Signed)
Patient calls nurse line regarding referral to ophthalmology. Per chart review, Dr. Zadie Rhine does not treat cataracts and referral needs to be placed for general ophthalmologist.   Please advise.   Talbot Grumbling, RN

## 2021-01-25 NOTE — Telephone Encounter (Signed)
Hi Joel Donaldson,   I placed a referral to ophthalmology as this patient needs cataract surgery.  Can you help with this?   -V

## 2021-01-25 NOTE — Telephone Encounter (Signed)
Will send patient to Dr. Kellie Moor office.  They will call patient for an appt.  Muranda Coye,CMA

## 2021-01-27 ENCOUNTER — Other Ambulatory Visit: Payer: Self-pay

## 2021-01-27 ENCOUNTER — Encounter: Payer: Self-pay | Admitting: Cardiovascular Disease

## 2021-01-27 ENCOUNTER — Ambulatory Visit (INDEPENDENT_AMBULATORY_CARE_PROVIDER_SITE_OTHER): Payer: Medicare Other | Admitting: Cardiovascular Disease

## 2021-01-27 VITALS — BP 139/72 | HR 70 | Ht 70.0 in | Wt 186.2 lb

## 2021-01-27 DIAGNOSIS — J42 Unspecified chronic bronchitis: Secondary | ICD-10-CM | POA: Diagnosis not present

## 2021-01-27 DIAGNOSIS — I25118 Atherosclerotic heart disease of native coronary artery with other forms of angina pectoris: Secondary | ICD-10-CM

## 2021-01-27 DIAGNOSIS — E785 Hyperlipidemia, unspecified: Secondary | ICD-10-CM | POA: Diagnosis not present

## 2021-01-27 DIAGNOSIS — I10 Essential (primary) hypertension: Secondary | ICD-10-CM | POA: Diagnosis not present

## 2021-01-27 NOTE — Progress Notes (Signed)
Cardiology Office Note:    Date:  01/27/2021   ID:  Joel Donaldson, DOB March 13, 1940, MRN [id number]  PCP:  [name], DO   [facility] Providers Cardiologist:  Sanda Klein, MD     Referring MD: [name], DO   Chief Complaint  Patient presents with   Chest Pain     History of Present Illness:    Joel Donaldson is a 80 y.o. male with a hx of rheumatoid arthritis and prostate cancer treated with radioactive seed implantation, who has been experiencing chest discomfort and dyspnea over the last few months.  He has a history of hypertension with onset roughly 10 years ago.  He quit smoking about 10 years ago, but probably has COPD.  He does not have diabetes mellitus or hypercholesterolemia.  After increasing his beta-blocker dose at his last appointment he has had very little in the way of chest discomfort.  It happens infrequently and does not really limit physical activity.  He did present to the emergency room on 01/17/2021 with chest pain improved with sublingual nitroglycerin, then recurred later the same day and so he was briefly hospitalized.  Cardiac enzymes were normal and the ECG was unchanged.  Amlodipine 5 mg daily was added and the metoprolol dose was decreased to 50 mg daily.  He continues to use albuterol occasionally for shortness of breath, on the average once daily, but does not have wheezing.  He denies edema, orthopnea, PND, chest pain at rest, palpitations, dizziness or syncope.  He does not have intermittent claudication.  Arthralgias involving bilateral knees and hands due to rheumatoid arthritis.  09/05/2020 coronary CT angiogram which showed remarkably severe coronary atherosclerosis.  The calcium score placing him in the 90th percentile for age/gender.  There was particularly severe calcium buildup in the LAD artery.  By morphological criteria, there was severe stenosis in the distal left circumflex coronary artery where there was a severe stenosis  with mild stenoses in the right coronary artery, proximal-mid LAD and OM1.  However by FFR, significant reduction in flow was seen in all 3 major coronary territories.  Due to ongoing symptoms underwent cardiac catheterization 09/16/2020 which essentially confirmed the findings on CT angiography.  The most significant lesion was 60% stenosis in the AV groove portion of the left circumflex coronary artery without evidence of flow-limiting hemodynamics by CT FFR.  He had normal left ventricular filling pressures.  Cardiac cath 09/16/2020   LPAV lesion is 60% stenosed.   Dist LM to Ost LAD lesion is 30% stenosed.   The left ventricular systolic function is normal.   SUMMARY Mild diffuse multivessel disease with no obvious significant stenosis.   Most notable stenosis is in the distal AV groove LCx roughly 60% (not positive by CT FFR). Significant systemic hypertension with normal LV EDP. Normal LV function with no obvious wall motion abnormality Suspect MICROVASCULAR DISEASE - ([place])  1. Coronary calcium score of 1523. This was 90th percentile for age-, race-, and sex-matched controls.   2. Normal coronary origin with left dominance.   3. There is severe (>70%) plaque in the distal LCX, and mild (25-49% stenosis in RCA, LAD, and OM1. CAD-RADS 4.  1. Left Main: FFRct 1.0   2. LAD: FFRct 0.95 proximal, 0.89 mid, 0.8 distal   3. LCX: FFRct 0.98 proximal, 0.98 mid, 0.77 distal.  OM1 FFRct 0.8   4. RCA: There are two small vessels arising form the RCA ostium. FFRct for RCA1 is 0.85. FFRct for RCA2  is 0.92 proximally and 0.69 mid.    He has normal renal function (creatinine 0.8) and a remarkably good lipid profile with an HDL of 72 and LDL of 74.  His electrocardiogram is normal.  His echocardiogram from May 17 shows normal LVEF and wall motion, mild LVH and grade 1 diastolic dysfunction, aortic valve sclerosis without stenosis.  His father reportedly had heart disease but lived to 68  years old.  His mother was similarly long lived to age 51, had hypertension.   Past Medical History:  Diagnosis Date   Alcohol abuse    Anxiety    Arthritis    hands, back, knees   Asthma    Depression    GERD (gastroesophageal reflux disease)    Glaucoma    Heart murmur asymptomatic   Hx of radioisotope therapy 07/26/11   Radiosactive Prostate Seed Implant/ I-125 Seeds   Hypertension    Neck pain 10/22/2019   Prostate cancer ([place]) dx'd 2016 or 2017   seed implants   Rheumatoid arthritis ([place]) 10/10/2012    Past Surgical History:  Procedure Laterality Date   CYSTOSCOPY  09/14/2011   Procedure: CYSTOSCOPY FLEXIBLE;  Surgeon: Hanley Ben, MD;  Location: [place];  Service: Urology;  Laterality: N/A;  No seeds seen in the bladder   LEFT HEART CATH AND CORONARY ANGIOGRAPHY N/A 09/16/2020   Procedure: LEFT HEART CATH AND CORONARY ANGIOGRAPHY;  Surgeon: Leonie Man, MD;  Location: [facility];  Service: Cardiovascular;  Laterality: N/A;   [place] IMPLANT  09/14/2011   Procedure: RADIOACTIVE SEED IMPLANT;  Surgeon: Hanley Ben, MD;  Location: [facility];  Service: Urology;  Laterality: N/A;  Total number of seeds -     SHOULDER SURGERY  YRS AGO   LEFT    Current Medications: Current Meds  Medication Sig   acetaminophen (TYLENOL) 500 MG tablet Take 1,000 mg by mouth every 8 (eight) hours as needed for mild pain or moderate pain.   albuterol (VENTOLIN HFA) 108 (90 Base) MCG/ACT inhaler Inhale 1-2 puffs into the lungs every 4 (four) hours as needed for wheezing or shortness of breath.   amLODipine (NORVASC) 5 MG tablet Take 1 tablet (5 mg total) by mouth daily.   aspirin EC 81 MG tablet Take 81 mg by mouth every morning.    atorvastatin (LIPITOR) 20 MG tablet Take 1 tablet (20 mg total) by mouth daily.   buprenorphine (BUTRANS) 7.5 MCG/HR Place 1 patch onto the skin every Thursday.   diclofenac Sodium  (VOLTAREN) 1 % GEL Apply 4 g topically 4 (four) times daily.   fluticasone (FLONASE) 50 MCG/ACT nasal spray SHAKE LIQUID AND USE 2 SPRAYS IN EACH NOSTRIL DAILY   lisinopril (ZESTRIL) 20 MG tablet TAKE 2 TABLETS(40MG ) BY MOUTH DAILY   metoprolol succinate (TOPROL-XL) 50 MG 24 hr tablet Take 1 tablet (50 mg total) by mouth daily.   Multiple Vitamins-Minerals (ONE-A-DAY PROACTIVE 65+) TABS Take 1 tablet by mouth daily with breakfast.   omeprazole (PRILOSEC) 40 MG capsule TAKE 1 CAPSULE(40 MG) BY MOUTH DAILY BEFORE BREAKFAST   polyethylene glycol (MIRALAX / GLYCOLAX) 17 g packet Take 17 g by mouth daily as needed for mild constipation.   umeclidinium bromide (INCRUSE ELLIPTA) 62.5 MCG/ACT AEPB Inhale 1 puff into the lungs daily.     Allergies:   Tramadol and Pollen extract   Social History   Socioeconomic History   Marital status: Legally Separated  Spouse name: Deneise Lever   Number of children: 1   Years of education: 11   Highest education level: Not on file  Occupational History   Occupation: Retired    Fish farm manager: RETIRED  Tobacco Use   Smoking status: Former    Packs/day: 2.00    Years: 50.00    Pack years: 100.00    Types: Cigarettes    Start date: 02/12/1953    Quit date: 10/13/2004    Years since quitting: 16.3   Smokeless tobacco: Never   Tobacco comments:    Smoked 2 ppd  Vaping Use   Vaping Use: Never used  Substance and Sexual Activity   Alcohol use: Not Currently    Alcohol/week: 14.0 standard drinks    Types: 14 Shots of liquor per week    Comment: 1/2 pint of liquor daily   Drug use: No   Sexual activity: Not Currently  Other Topics Concern   Not on file  Social History Narrative   Patient lives with his granddaughter in [place].   Patient is currently legally separated.    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    Patient enjoys seeing friends, hunting, and fishing.          Social Determinants of Health   Financial Resource Strain:  Low Risk    Difficulty of Paying Living Expenses: Not hard at all  Food Insecurity: No Food Insecurity   Worried About Charity fundraiser in the Last Year: Never true   [place] in the Last Year: Never true  Transportation Needs: No Transportation Needs   Lack of Transportation (Medical): No   Lack of Transportation (Non-Medical): No  Physical Activity: Insufficiently Active   Days of Exercise per Week: 2 days   Minutes of Exercise per Session: 10 min  Stress: No Stress Concern Present   Feeling of Stress : Only a little  Social Connections: Moderately Isolated   Frequency of Communication with Friends and Family: Three times a week   Frequency of Social Gatherings with Friends and Family: More than three times a week   Attends Religious Services: More than 4 times per year   Active Member of [employer] or Organizations: No   Attends Archivist Meetings: Never   Marital Status: Separated     Family History: The patient's family history includes Diabetes in his mother; Heart disease in his father.  ROS:   Please see the history of present illness.     All other systems reviewed and are negative.  EKGs/Labs/Other Studies Reviewed:    The following studies were reviewed today:  Cardiac catheterization 09/16/2020   LPAV lesion is 60% stenosed.   Dist LM to Ost LAD lesion is 30% stenosed.   The left ventricular systolic function is normal.   SUMMARY Mild diffuse multivessel disease with no obvious significant stenosis.   Most notable stenosis is in the distal AV groove LCx roughly 60% (not positive by CT FFR). Significant systemic hypertension with normal LV EDP. Normal LV function with no obvious wall motion abnormality Suspect MICROVASCULAR DISEASE - ([place])  09/05/2020 Left main: 0   Left anterior descending artery: 989   Left circumflex artery: 259   Right coronary artery: 276   Total: 1523   Percentile: 90th   Other findings:   Normal  pulmonary vein drainage into the left atrium.   Normal let atrial appendage without a thrombus.   Normal size of the pulmonary artery.   IMPRESSION:  1. Coronary calcium score of 1523. This was 90th percentile for age-, race-, and sex-matched controls.   2. Normal coronary origin with left dominance.   3. There is severe (>70%) plaque in the distal LCX, and mild (25-49% stenosis in RCA, LAD, and OM1. CAD-RADS 4. 1. Left Main: FFRct 1.0   2. LAD: FFRct 0.95 proximal, 0.89 mid, 0.8 distal   3. LCX: FFRct 0.98 proximal, 0.98 mid, 0.77 distal.  OM1 FFRct 0.8   4. RCA: There are two small vessels arising form the RCA ostium. FFRct for RCA1 is 0.85. FFRct for RCA2 is 0.92 proximally and 0.69 mid.   Echocardiogram 06/28/2020   1. Left ventricular ejection fraction, by estimation, is 55 to 60%. The  left ventricle has normal function. The left ventricle has no regional  wall motion abnormalities. There is mild left ventricular hypertrophy.  Left ventricular diastolic parameters  are consistent with Grade I diastolic dysfunction (impaired relaxation).   2. Right ventricular systolic function is normal. The right ventricular  size is normal.   3. The mitral valve is normal in structure. No evidence of mitral valve  regurgitation.   4. The aortic valve is tricuspid. Aortic valve regurgitation is mild.  Mild to moderate aortic valve sclerosis/calcification is present, without  any evidence of aortic stenosis.    EKG:  EKG is not ordered today.  The ekg ordered 01/17/2021 demonstrates normal sinus rhythm, normal ECG  Recent Labs: 01/17/2021: ALT 12; BUN 10; Creatinine, Ser 0.78; Hemoglobin 12.6; Platelets 205; Potassium 3.7; Sodium 137  Recent Lipid Panel    Component Value Date/Time   CHOL 145 01/18/2021 0434   CHOL 170 07/30/2017 1425   TRIG 53 01/18/2021 0434   HDL 46 01/18/2021 0434   HDL 43 07/30/2017 1425   CHOLHDL 3.2 01/18/2021 0434   VLDL 11 01/18/2021 0434   LDLCALC  88 01/18/2021 0434   LDLCALC 80 07/30/2017 1425   LDLDIRECT 99 06/24/2007 2025     Risk Assessment/Calculations:           Physical Exam:    VS:  BP 139/72 (BP Location: Right Arm, Patient Position: Sitting, Cuff Size: Normal)    Pulse 70    Ht 5\' 10"  (1.778 m)    Wt 186 lb 3.2 oz (84.5 kg)    SpO2 97%    BMI 26.72 kg/m     Wt Readings from Last 3 Encounters:  01/27/21 186 lb 3.2 oz (84.5 kg)  01/17/21 183 lb 8 oz (83.2 kg)  01/10/21 183 lb 8 oz (83.2 kg)     General: Alert, oriented x3, no distress, overweight Head: no evidence of trauma, PERRL, EOMI, no exophtalmos or lid lag, no myxedema, no xanthelasma; normal ears, nose and oropharynx Neck: normal jugular venous pulsations and no hepatojugular reflux; brisk carotid pulses without delay and no carotid bruits Chest: clear to auscultation, no signs of consolidation by percussion or palpation, normal fremitus, symmetrical and full respiratory excursions Cardiovascular: normal position and quality of the apical impulse, regular rhythm, normal first and second heart sounds, no murmurs, rubs or gallops Abdomen: no tenderness or distention, no masses by palpation, no abnormal pulsatility or arterial bruits, normal bowel sounds, no hepatosplenomegaly Extremities: no clubbing, cyanosis or edema; 2+ radial, ulnar and brachial pulses bilaterally; 2+ right femoral, posterior tibial and dorsalis pedis pulses; 2+ left femoral, posterior tibial and dorsalis pedis pulses; no subclavian or femoral bruits Neurological: grossly nonfocal Psych: Normal mood and affect    ASSESSMENT:  1. Coronary artery disease of native artery of native heart with stable angina pectoris ([place])   2. Essential hypertension   3. Hyperlipidemia LDL goal <70   4. Chronic bronchitis, unspecified chronic bronchitis type ([place])       PLAN:    In order of problems listed above:  CAD/angina/dyspnea on exertion: No severe stenoses in major epicardial arteries on  coronary angiography or CT angiography.  Currently seems to have satisfactory symptom control with combination of amlodipine and metoprolol.  Continue aspirin and statin.  There is room to increase the dose of amlodipine or add long-acting nitrates or even Ranexa if symptoms recur. HTN: Adequate control. HLP: Earlier this year his LDL cholesterol was 72, little higher on labs performed last week at 88.  Asked him to make sure he is taking the statin on a daily basis.  Recheck labs in about 6 months and increase the dose if necessary to maintain target LDL less than 70. COPD: His albuterol on the average once a day.  Also on Incruse.  May be best not to increase the beta-blocker dose any further.  Avoid carvedilol.   Medication Adjustments/Labs and Tests Ordered: Current medicines are reviewed at length with the patient today.  Concerns regarding medicines are outlined above.  No orders of the defined types were placed in this encounter.   No orders of the defined types were placed in this encounter.     Patient Instructions  Medication Instructions:  The current medical regimen is effective;  continue present plan and medications.  *If you need a refill on your cardiac medications before your next appointment, please call your pharmacy*   Follow-Up: At [facility], you and your health needs are our priority.  As part of our continuing mission to provide you with exceptional heart care, we have created designated Provider Care Teams.  These Care Teams include your primary Cardiologist (physician) and Advanced Practice Providers (APPs -  Physician Assistants and Nurse Practitioners) who all work together to provide you with the care you need, when you need it.  We recommend signing up for the patient portal called "MyChart".  Sign up information is provided on this After Visit Summary.  MyChart is used to connect with patients for Virtual Visits (Telemedicine).  Patients are able to view  lab/test results, encounter notes, upcoming appointments, etc.  Non-urgent messages can be sent to your provider as well.   To learn more about what you can do with MyChart, go to [email or web address].    Your next appointment:   12 month(s)  The format for your next appointment:   In Person  Provider:   Sanda Klein, MD      Signed, Sanda Klein, MD  01/27/2021 3:13 PM    [place]

## 2021-01-27 NOTE — Patient Instructions (Signed)
Medication Instructions:  The current medical regimen is effective;  continue present plan and medications.  *If you need a refill on your cardiac medications before your next appointment, please call your pharmacy*   Follow-Up: At [facility], you and your health needs are our priority.  As part of our continuing mission to provide you with exceptional heart care, we have created designated Provider Care Teams.  These Care Teams include your primary Cardiologist (physician) and Advanced Practice Providers (APPs -  Physician Assistants and Nurse Practitioners) who all work together to provide you with the care you need, when you need it.  We recommend signing up for the patient portal called "MyChart".  Sign up information is provided on this After Visit Summary.  MyChart is used to connect with patients for Virtual Visits (Telemedicine).  Patients are able to view lab/test results, encounter notes, upcoming appointments, etc.  Non-urgent messages can be sent to your provider as well.   To learn more about what you can do with MyChart, go to [email or web address].    Your next appointment:   12 month(s)  The format for your next appointment:   In Person  Provider:   Sanda Klein, MD

## 2021-02-15 ENCOUNTER — Telehealth: Payer: Self-pay | Admitting: Cardiovascular Disease

## 2021-02-15 MED ORDER — AMLODIPINE BESYLATE 5 MG PO TABS: 5.0000 mg | ORAL_TABLET | Freq: Every day | ORAL | 3 refills | Status: DC

## 2021-02-15 MED ORDER — METOPROLOL SUCCINATE ER 50 MG PO TB24: 50.0000 mg | ORAL_TABLET | Freq: Every day | ORAL | 3 refills | Status: DC

## 2021-02-15 NOTE — Telephone Encounter (Signed)
°*  STAT* If patient is at the pharmacy, call can be transferred to refill team.   1. Which medications need to be refilled? (please list name of each medication and dose if known)  amLODipine (NORVASC) 5 MG tablet metoprolol succinate (TOPROL-XL) 50 MG 24 hr tablet  2. Which pharmacy/location (including street and city if local pharmacy) is medication to be sent to? [facility] on [address]   3. Do they need a 30 day or 90 day supply? 90 day

## 2021-03-07 DIAGNOSIS — R030 Elevated blood-pressure reading, without diagnosis of hypertension: Secondary | ICD-10-CM | POA: Diagnosis not present

## 2021-03-07 DIAGNOSIS — Z79899 Other long term (current) drug therapy: Secondary | ICD-10-CM | POA: Diagnosis not present

## 2021-03-07 DIAGNOSIS — M546 Pain in thoracic spine: Secondary | ICD-10-CM | POA: Diagnosis not present

## 2021-03-07 DIAGNOSIS — M545 Low back pain, unspecified: Secondary | ICD-10-CM | POA: Diagnosis not present

## 2021-03-07 DIAGNOSIS — G894 Chronic pain syndrome: Secondary | ICD-10-CM | POA: Diagnosis not present

## 2021-03-07 DIAGNOSIS — G8929 Other chronic pain: Secondary | ICD-10-CM | POA: Diagnosis not present

## 2021-04-05 ENCOUNTER — Other Ambulatory Visit: Payer: Self-pay | Admitting: Family Medicine

## 2021-04-05 DIAGNOSIS — Z79899 Other long term (current) drug therapy: Secondary | ICD-10-CM | POA: Diagnosis not present

## 2021-04-05 DIAGNOSIS — R079 Chest pain, unspecified: Secondary | ICD-10-CM

## 2021-04-05 DIAGNOSIS — M545 Low back pain, unspecified: Secondary | ICD-10-CM | POA: Diagnosis not present

## 2021-04-05 DIAGNOSIS — G8929 Other chronic pain: Secondary | ICD-10-CM | POA: Diagnosis not present

## 2021-04-05 DIAGNOSIS — M546 Pain in thoracic spine: Secondary | ICD-10-CM | POA: Diagnosis not present

## 2021-04-05 DIAGNOSIS — R030 Elevated blood-pressure reading, without diagnosis of hypertension: Secondary | ICD-10-CM | POA: Diagnosis not present

## 2021-04-05 DIAGNOSIS — G894 Chronic pain syndrome: Secondary | ICD-10-CM | POA: Diagnosis not present

## 2021-04-07 DIAGNOSIS — Z79899 Other long term (current) drug therapy: Secondary | ICD-10-CM | POA: Diagnosis not present

## 2021-04-28 ENCOUNTER — Other Ambulatory Visit: Payer: Self-pay | Admitting: Family Medicine

## 2021-05-03 DIAGNOSIS — G894 Chronic pain syndrome: Secondary | ICD-10-CM | POA: Diagnosis not present

## 2021-05-03 DIAGNOSIS — M545 Low back pain, unspecified: Secondary | ICD-10-CM | POA: Diagnosis not present

## 2021-05-03 DIAGNOSIS — Z79899 Other long term (current) drug therapy: Secondary | ICD-10-CM | POA: Diagnosis not present

## 2021-05-03 DIAGNOSIS — M546 Pain in thoracic spine: Secondary | ICD-10-CM | POA: Diagnosis not present

## 2021-05-03 DIAGNOSIS — G8929 Other chronic pain: Secondary | ICD-10-CM | POA: Diagnosis not present

## 2021-05-03 DIAGNOSIS — R030 Elevated blood-pressure reading, without diagnosis of hypertension: Secondary | ICD-10-CM | POA: Diagnosis not present

## 2021-05-25 ENCOUNTER — Ambulatory Visit (INDEPENDENT_AMBULATORY_CARE_PROVIDER_SITE_OTHER): Payer: Medicare Other | Admitting: Family Medicine

## 2021-05-25 ENCOUNTER — Encounter: Payer: Self-pay | Admitting: Family Medicine

## 2021-05-25 VITALS — BP 127/69 | HR 58 | Ht 70.0 in | Wt 182.2 lb

## 2021-05-25 DIAGNOSIS — R519 Headache, unspecified: Secondary | ICD-10-CM | POA: Diagnosis not present

## 2021-05-25 DIAGNOSIS — R0609 Other forms of dyspnea: Secondary | ICD-10-CM | POA: Diagnosis not present

## 2021-05-25 MED ORDER — NITROGLYCERIN 0.4 MG SL SUBL: 0.4000 mg | SUBLINGUAL_TABLET | SUBLINGUAL | 0 refills | Status: DC | PRN

## 2021-05-25 MED ORDER — PROCHLORPERAZINE MALEATE 10 MG PO TABS: 10.0000 mg | ORAL_TABLET | Freq: Four times a day (QID) | ORAL | 0 refills | Status: DC | PRN

## 2021-05-25 MED ORDER — NAPROXEN 500 MG PO TABS: 500.0000 mg | ORAL_TABLET | Freq: Two times a day (BID) | ORAL | 0 refills | Status: DC

## 2021-05-25 NOTE — Progress Notes (Signed)
? ?  SUBJECTIVE:  ? ?CHIEF COMPLAINT / HPI:  ? ? ?Joel Donaldson is a 81 y.o. male here for headaches.   ? ?He reports headaches began about a month ago.  He has been having a headache even when he wakes up in the morning.  They are throbbing and in the back of his head.  Tylenol helps but does not take the pain completely away.  States he has history of headaches but his last 1 was years ago.  He believes it could be related to his cataracts and notes that his eyes are bothering him.  He is due to have cataract surgery in July.  Denies light and sound sensitivity.  No nausea, vomiting.  He has not felt confused or had difficulty walking.  There has been no new extremity weakness. ? ? ? ?PERTINENT  PMH / PSH: reviewed and updated as appropriate  ? ?OBJECTIVE:  ? ?BP 127/69   Pulse (!) 58   Ht '5\' 10"'$  (1.778 m)   Wt 182 lb 4 oz (82.7 kg)   SpO2 98%   BMI 26.15 kg/m?   ? ?GEN: pleasant elderly male, in no acute distress  ?CV: regular rate and rhythm ?RESP: no increased work of breathing, clear to ascultation bilaterally ?MSK: Baseline strength and gait ?SKIN: warm, dry ?NEURO: alert, speech at baseline, oriented x4, moves all extremities appropriately, normal tone, cranial nerves II through XII grossly intact, gross sensation intact ? ? ? ?ASSESSMENT/PLAN:  ? ?Acute nonintractable headache ?Patient is an 81 year old male with early morning headaches for the past month.  Etiology uncertain at this time.  He has had headaches in the past however these headaches are daily. On chart review, CT head in October 2022 after a fall  showed mild age-related volume loss and small vessel white matter changes.   This is an acute change and warrants further investigation.  Obtain noncontrast CT head.  Start Naprosyn 500 mg twice daily as needed with Compazine 10 mg every 6 as needed.  Would not provide Benadryl to complete the headache cocktail given his age.  ED and return precautions given.  Red flag symptoms reviewed and  patient voiced understanding. ?  ? ?History of angina ?Patient denies chest pain and shortness of breath.  He has run out of his nitroglycerin.  Refilled at patient's request today. ? ? ?[name], DO ?PGY-3, [facility] ?05/25/2021  ? ? ? ? ? ? ? ? ?

## 2021-05-25 NOTE — Patient Instructions (Addendum)
We need to get a image of your head to be sure there are no structural causes to your headache. We got some blood work today as well.   ? ?Stop by the pharmacy the medications for your headache. ?- Compazine take every up to three times a day as needed  ?- Naprosyn take twice a day as needed  ? ?If your headache changes or you have new symptoms, be sure to go to the emergency department.  ? ?Take Care, ? ?Dr. Susa Simmonds  ?

## 2021-05-26 LAB — COMPREHENSIVE METABOLIC PANEL
ALT: 15 IU/L (ref 0–44)
AST: 12 IU/L (ref 0–40)
Albumin/Globulin Ratio: 2 (ref 1.2–2.2)
Albumin: 4 g/dL (ref 3.6–4.6)
Alkaline Phosphatase: 88 IU/L (ref 44–121)
BUN/Creatinine Ratio: 13 (ref 10–24)
BUN: 11 mg/dL (ref 8–27)
Bilirubin Total: 0.2 mg/dL (ref 0.0–1.2)
CO2: 21 mmol/L (ref 20–29)
Calcium: 8.6 mg/dL (ref 8.6–10.2)
Chloride: 110 mmol/L — ABNORMAL HIGH (ref 96–106)
Creatinine, Ser: 0.83 mg/dL (ref 0.76–1.27)
Globulin, Total: 2 g/dL (ref 1.5–4.5)
Glucose: 95 mg/dL (ref 70–99)
Potassium: 3.9 mmol/L (ref 3.5–5.2)
Sodium: 145 mmol/L — ABNORMAL HIGH (ref 134–144)
Total Protein: 6 g/dL (ref 6.0–8.5)
eGFR: 87 mL/min/{1.73_m2} (ref >59)

## 2021-05-26 LAB — CBC
Hematocrit: 38 % (ref 37.5–51.0)
Hemoglobin: 12.4 g/dL — ABNORMAL LOW (ref 13.0–17.7)
MCH: 27.7 pg (ref 26.6–33.0)
MCHC: 32.6 g/dL (ref 31.5–35.7)
MCV: 85 fL (ref 79–97)
Platelets: 224 10*3/uL (ref 150–450)
RBC: 4.48 x10E6/uL (ref 4.14–5.80)
RDW: 14.6 % (ref 11.6–15.4)
WBC: 3.7 10*3/uL (ref 3.4–10.8)

## 2021-05-31 DIAGNOSIS — R519 Headache, unspecified: Secondary | ICD-10-CM | POA: Insufficient documentation

## 2021-05-31 HISTORY — DX: Headache, unspecified: R51.9

## 2021-05-31 NOTE — Assessment & Plan Note (Addendum)
Patient is an 81 year old male with early morning headaches for the past month.  Etiology uncertain at this time.  He has had headaches in the past however these headaches are daily. On chart review, CT head in October 2022 after a fall  showed mild age-related volume loss and small vessel white matter changes.   This is an acute change and warrants further investigation.  Obtain noncontrast CT head.  Start Naprosyn 500 mg twice daily as needed with Compazine 10 mg every 6 as needed.  Would not provide Benadryl to complete the headache cocktail given his age.  ED and return precautions given.  Red flag symptoms reviewed and patient voiced understanding. ?

## 2021-06-01 DIAGNOSIS — Z79899 Other long term (current) drug therapy: Secondary | ICD-10-CM | POA: Diagnosis not present

## 2021-06-01 DIAGNOSIS — M545 Low back pain, unspecified: Secondary | ICD-10-CM | POA: Diagnosis not present

## 2021-06-01 DIAGNOSIS — G894 Chronic pain syndrome: Secondary | ICD-10-CM | POA: Diagnosis not present

## 2021-06-01 DIAGNOSIS — M546 Pain in thoracic spine: Secondary | ICD-10-CM | POA: Diagnosis not present

## 2021-06-01 DIAGNOSIS — G8929 Other chronic pain: Secondary | ICD-10-CM | POA: Diagnosis not present

## 2021-06-05 ENCOUNTER — Ambulatory Visit
Admission: RE | Admit: 2021-06-05 | Discharge: 2021-06-05 | Disposition: A | Discharge status: Home / Self Care | Payer: Medicare Other | Source: Ambulatory Visit | Attending: Family Medicine | Admitting: Family Medicine

## 2021-06-05 DIAGNOSIS — R519 Headache, unspecified: Secondary | ICD-10-CM | POA: Diagnosis not present

## 2021-06-13 ENCOUNTER — Telehealth: Payer: Self-pay

## 2021-06-13 NOTE — Telephone Encounter (Signed)
Patient LVM requesting results of recent imaging.  ? ?Will forward to PCP.  ?

## 2021-06-14 NOTE — Telephone Encounter (Signed)
Called patient to let him know his Head CT was normal. All questions asked were answered. His headaches resolved with outpatient treatment.   ? ?[name], DO  ? ?

## 2021-06-16 DIAGNOSIS — H25811 Combined forms of age-related cataract, right eye: Secondary | ICD-10-CM | POA: Diagnosis not present

## 2021-06-16 DIAGNOSIS — H25812 Combined forms of age-related cataract, left eye: Secondary | ICD-10-CM | POA: Diagnosis not present

## 2021-06-18 ENCOUNTER — Other Ambulatory Visit: Payer: Self-pay

## 2021-06-18 ENCOUNTER — Encounter (HOSPITAL_COMMUNITY): Payer: Self-pay

## 2021-06-18 ENCOUNTER — Emergency Department (HOSPITAL_COMMUNITY)
Admission: EM | Admit: 2021-06-18 | Discharge: 2021-06-18 | Disposition: A | Discharge status: Home / Self Care | Payer: Medicare Other | Attending: Emergency Medicine | Admitting: Emergency Medicine

## 2021-06-18 DIAGNOSIS — H6122 Impacted cerumen, left ear: Secondary | ICD-10-CM | POA: Diagnosis not present

## 2021-06-18 DIAGNOSIS — Z7982 Long term (current) use of aspirin: Secondary | ICD-10-CM | POA: Insufficient documentation

## 2021-06-18 DIAGNOSIS — H9202 Otalgia, left ear: Secondary | ICD-10-CM | POA: Diagnosis present

## 2021-06-18 DIAGNOSIS — Z79899 Other long term (current) drug therapy: Secondary | ICD-10-CM | POA: Insufficient documentation

## 2021-06-18 DIAGNOSIS — J45909 Unspecified asthma, uncomplicated: Secondary | ICD-10-CM | POA: Insufficient documentation

## 2021-06-18 DIAGNOSIS — I10 Essential (primary) hypertension: Secondary | ICD-10-CM | POA: Insufficient documentation

## 2021-06-18 MED ORDER — CARBAMIDE PEROXIDE 6.5 % OT SOLN: 5.0000 [drp] | Freq: Two times a day (BID) | OTIC | 0 refills | Status: DC

## 2021-06-18 NOTE — Discharge Instructions (Signed)
Put the eardrops in your ear to help with the earwax buildup.  Consider following up with an ENT doctor or your primary doctor for repeat ear irrigation if the symptoms persist ?

## 2021-06-18 NOTE — ED Provider Notes (Signed)
?[facility] ?Provider Note ? ? ?CSN: [id number] ?Arrival date & time: 06/18/21  2002 ? ?  ? ?History ? ?Chief Complaint  ?Patient presents with  ? Otalgia  ? ? ?Joel Donaldson is a 81 y.o. male. ? ? ?Otalgia ?Associated symptoms: no fever   ? ?Patient has a history of arthritis, GERD, hypertension, asthma, rheumatoid arthritis and presents to the ED with complaints of left ear pain.  Patient states he feels like there is something stuck in his ear.  Started noticed the discomfort this afternoon.  He is not having any trouble with any fevers or chills.  No recent injuries.  No chest pain or jaw pain.  No sore throat.  No headache or infectious type symptoms. ? ?Home Medications ?Prior to Admission medications   ?Medication Sig Start Date End Date Taking? Authorizing Provider  ?carbamide peroxide (DEBROX) 6.5 % OTIC solution Place 5 drops into the left ear 2 (two) times daily. 06/18/21  Yes Dorie Rank, MD  ?acetaminophen (TYLENOL) 500 MG tablet Take 1,000 mg by mouth every 8 (eight) hours as needed for mild pain or moderate pain.    [provider]  ?albuterol (VENTOLIN HFA) 108 (90 Base) MCG/ACT inhaler Inhale 1-2 puffs into the lungs every 4 (four) hours as needed for wheezing or shortness of breath. 01/10/21   [name], DO  ?amLODipine (NORVASC) 5 MG tablet Take 1 tablet (5 mg total) by mouth daily. 02/15/21 03/17/21  Croitoru, Mihai, MD  ?aspirin EC 81 MG tablet Take 81 mg by mouth every morning.     [provider]  ?atorvastatin (LIPITOR) 20 MG tablet Take 1 tablet (20 mg total) by mouth daily. 01/10/21 04/10/21  [name], DO  ?buprenorphine (BUTRANS) 7.5 MCG/HR Place 1 patch onto the skin every Thursday. 05/17/20   [provider]  ?diclofenac Sodium (VOLTAREN) 1 % GEL Apply 4 g topically 4 (four) times daily. 01/10/21   [name], DO  ?fluticasone (FLONASE) 50 MCG/ACT nasal spray SHAKE LIQUID AND USE 2 SPRAYS IN EACH NOSTRIL DAILY  01/10/21   [name], DO  ?INCRUSE ELLIPTA 62.5 MCG/ACT AEPB INHALE 1 PUFF INTO THE LUNGS DAILY 04/28/21   [name], DO  ?lidocaine (XYLOCAINE) 5 % ointment Apply 1 application topically daily as needed for mild pain or moderate pain. ?Patient not taking: Reported on 01/27/2021 01/10/21   [name], DO  ?lisinopril (ZESTRIL) 20 MG tablet TAKE 2 TABLETS('40MG'$ ) BY MOUTH DAILY 01/10/21   [name], DO  ?metoprolol succinate (TOPROL-XL) 50 MG 24 hr tablet Take 1 tablet (50 mg total) by mouth daily. 02/15/21 03/17/21  Croitoru, Mihai, MD  ?Multiple Vitamins-Minerals (ONE-A-DAY PROACTIVE 65+) TABS Take 1 tablet by mouth daily with breakfast.    [provider]  ?naproxen (NAPROSYN) 500 MG tablet Take 1 tablet (500 mg total) by mouth 2 (two) times daily with a meal. 05/25/21   Brimage, Ronnette Juniper, DO  ?nitroGLYCERIN (NITROSTAT) 0.4 MG SL tablet Place 1 tablet (0.4 mg total) under the tongue every 5 (five) minutes as needed for chest pain (chest pain). 05/25/21   [name], DO  ?omeprazole (PRILOSEC) 40 MG capsule TAKE 1 CAPSULE(40 MG) BY MOUTH DAILY BEFORE BREAKFAST 01/10/21   Brimage, Ronnette Juniper, DO  ?polyethylene glycol (MIRALAX / GLYCOLAX) 17 g packet Take 17 g by mouth daily as needed for mild constipation. 01/10/21   [name], DO  ?prochlorperazine (COMPAZINE) 10 MG tablet Take 1 tablet (10 mg total) by mouth every 6 (six) hours  as needed for nausea or vomiting. 05/25/21   [name], DO  ?   ? ?Allergies    ?Tramadol and Pollen extract   ? ?Review of Systems   ?Review of Systems  ?Constitutional:  Negative for fever.  ?HENT:  Positive for ear pain.   ? ?Physical Exam ?Updated Vital Signs ?BP (!) 157/78   Pulse 60   Temp 98.1 ?F (36.7 ?C) (Oral)   Resp 16   SpO2 98%  ?Physical Exam ?Vitals and nursing note reviewed.  ?Constitutional:   ?   General: He is not in acute distress. ?   Appearance: He is well-developed.  ?HENT:  ?   Head: Normocephalic and atraumatic.  ?   Comments:  Cerumen impaction of the left ear canal, unable to visualize tympanic membrane ?   Right Ear: Tympanic membrane and external ear normal.  ?   Left Ear: External ear normal. There is impacted cerumen.  ?Eyes:  ?   General: No scleral icterus.    ?   Right eye: No discharge.     ?   Left eye: No discharge.  ?   Conjunctiva/sclera: Conjunctivae normal.  ?Neck:  ?   Trachea: No tracheal deviation.  ?Cardiovascular:  ?   Rate and Rhythm: Normal rate.  ?Pulmonary:  ?   Effort: Pulmonary effort is normal. No respiratory distress.  ?   Breath sounds: No stridor.  ?Abdominal:  ?   General: There is no distension.  ?Musculoskeletal:     ?   General: No swelling or deformity.  ?   Cervical back: Neck supple.  ?Skin: ?   General: Skin is warm and dry.  ?   Findings: No rash.  ?Neurological:  ?   Mental Status: He is alert.  ?   Cranial Nerves: Cranial nerve deficit: no gross deficits.  ? ? ?ED Results / Procedures / Treatments   ?Labs ?(all labs ordered are listed, but only abnormal results are displayed) ?Labs Reviewed - No data to display ? ?EKG ?None ? ?Radiology ?No results found. ? ?Procedures ?Procedures  ? ? ?Medications Ordered in ED ?Medications - No data to display ? ?ED Course/ Medical Decision Making/ A&P ?Clinical Course as of 06/18/21 2224  ?Sun Jun 18, 2021  ?2223 Patient reexamined.  Still has residual wax in his ear canal but states his symptoms are significantly improved and is no longer having ear pain [JK]  ?  ?Clinical Course User Index ?[JK] Dorie Rank, MD  ? ?                        ?Medical Decision Making ?Patient with impacted cerumen on exam.  We will proceed with ear irrigation and reassess ? ?Risk ?OTC drugs. ? ? ?Patient presented with complaints of left ear pain.  On exam he had evidence of cerumen impaction.  No evidence of erythema to suggest infection.  Patient's ear was irrigated in the ED by nursing staff.  He still has some residual earwax on exam but states he feels much better.  Will  discharge home with Debrox prescription and recommend outpatient follow-up with either PCP or ENT ? ? ? ? ? ? ? ?Final Clinical Impression(s) / ED Diagnoses ?Final diagnoses:  ?Impacted cerumen of left ear  ? ? ?Rx / DC Orders ?ED Discharge Orders   ? ?      Ordered  ?  carbamide peroxide (DEBROX) 6.5 % OTIC solution  2 times daily       ?  06/18/21 2220  ? ?  ?  ? ?  ? ? ?  ?Dorie Rank, MD ?06/18/21 2224 ? ?

## 2021-06-18 NOTE — ED Triage Notes (Signed)
Pt reports he is here today due to left ear pain. Pt reports that he feels like there is something stuck in his ear. Pt reports that he was sitting on the porch this afternoon when started to have the feeling something was in his ear. Pt denies any chest pain, jaw pain, headache, sob, dizziness.  ?

## 2021-06-29 DIAGNOSIS — H25811 Combined forms of age-related cataract, right eye: Secondary | ICD-10-CM | POA: Diagnosis not present

## 2021-07-03 DIAGNOSIS — M25561 Pain in right knee: Secondary | ICD-10-CM | POA: Diagnosis not present

## 2021-07-03 DIAGNOSIS — Z79899 Other long term (current) drug therapy: Secondary | ICD-10-CM | POA: Diagnosis not present

## 2021-07-03 DIAGNOSIS — M25511 Pain in right shoulder: Secondary | ICD-10-CM | POA: Diagnosis not present

## 2021-07-03 DIAGNOSIS — M25562 Pain in left knee: Secondary | ICD-10-CM | POA: Diagnosis not present

## 2021-07-03 DIAGNOSIS — G8929 Other chronic pain: Secondary | ICD-10-CM | POA: Diagnosis not present

## 2021-07-05 ENCOUNTER — Other Ambulatory Visit: Payer: Self-pay | Admitting: Family Medicine

## 2021-07-05 DIAGNOSIS — J449 Chronic obstructive pulmonary disease, unspecified: Secondary | ICD-10-CM

## 2021-07-26 ENCOUNTER — Other Ambulatory Visit: Payer: Self-pay | Admitting: Family Medicine

## 2021-08-01 DIAGNOSIS — G8929 Other chronic pain: Secondary | ICD-10-CM | POA: Diagnosis not present

## 2021-08-01 DIAGNOSIS — G894 Chronic pain syndrome: Secondary | ICD-10-CM | POA: Diagnosis not present

## 2021-08-01 DIAGNOSIS — M25511 Pain in right shoulder: Secondary | ICD-10-CM | POA: Diagnosis not present

## 2021-08-01 DIAGNOSIS — E559 Vitamin D deficiency, unspecified: Secondary | ICD-10-CM | POA: Diagnosis not present

## 2021-08-01 DIAGNOSIS — Z131 Encounter for screening for diabetes mellitus: Secondary | ICD-10-CM | POA: Diagnosis not present

## 2021-08-01 DIAGNOSIS — Z79899 Other long term (current) drug therapy: Secondary | ICD-10-CM | POA: Diagnosis not present

## 2021-08-01 DIAGNOSIS — M545 Low back pain, unspecified: Secondary | ICD-10-CM | POA: Diagnosis not present

## 2021-08-01 DIAGNOSIS — Z0000 Encounter for general adult medical examination without abnormal findings: Secondary | ICD-10-CM | POA: Diagnosis not present

## 2021-08-01 DIAGNOSIS — M546 Pain in thoracic spine: Secondary | ICD-10-CM | POA: Diagnosis not present

## 2021-08-01 DIAGNOSIS — M25512 Pain in left shoulder: Secondary | ICD-10-CM | POA: Diagnosis not present

## 2021-08-01 DIAGNOSIS — M25562 Pain in left knee: Secondary | ICD-10-CM | POA: Diagnosis not present

## 2021-08-01 DIAGNOSIS — M25561 Pain in right knee: Secondary | ICD-10-CM | POA: Diagnosis not present

## 2021-08-03 DIAGNOSIS — H25812 Combined forms of age-related cataract, left eye: Secondary | ICD-10-CM | POA: Diagnosis not present

## 2021-08-03 DIAGNOSIS — Z79899 Other long term (current) drug therapy: Secondary | ICD-10-CM | POA: Diagnosis not present

## 2021-08-03 DIAGNOSIS — H269 Unspecified cataract: Secondary | ICD-10-CM | POA: Diagnosis not present

## 2021-08-29 DIAGNOSIS — G894 Chronic pain syndrome: Secondary | ICD-10-CM | POA: Diagnosis not present

## 2021-08-29 DIAGNOSIS — M545 Low back pain, unspecified: Secondary | ICD-10-CM | POA: Diagnosis not present

## 2021-08-29 DIAGNOSIS — M25512 Pain in left shoulder: Secondary | ICD-10-CM | POA: Diagnosis not present

## 2021-08-29 DIAGNOSIS — M25561 Pain in right knee: Secondary | ICD-10-CM | POA: Diagnosis not present

## 2021-08-29 DIAGNOSIS — E559 Vitamin D deficiency, unspecified: Secondary | ICD-10-CM | POA: Diagnosis not present

## 2021-08-29 DIAGNOSIS — Z79899 Other long term (current) drug therapy: Secondary | ICD-10-CM | POA: Diagnosis not present

## 2021-08-29 DIAGNOSIS — G8929 Other chronic pain: Secondary | ICD-10-CM | POA: Diagnosis not present

## 2021-08-29 DIAGNOSIS — Z9181 History of falling: Secondary | ICD-10-CM | POA: Diagnosis not present

## 2021-08-29 DIAGNOSIS — M546 Pain in thoracic spine: Secondary | ICD-10-CM | POA: Diagnosis not present

## 2021-08-29 DIAGNOSIS — M25562 Pain in left knee: Secondary | ICD-10-CM | POA: Diagnosis not present

## 2021-08-29 DIAGNOSIS — M25511 Pain in right shoulder: Secondary | ICD-10-CM | POA: Diagnosis not present

## 2021-09-01 ENCOUNTER — Emergency Department (HOSPITAL_COMMUNITY)
Admission: EM | Admit: 2021-09-01 | Discharge: 2021-09-01 | Disposition: A | Discharge status: Home / Self Care | Payer: Medicare Other | Attending: Emergency Medicine | Admitting: Emergency Medicine

## 2021-09-01 ENCOUNTER — Encounter (HOSPITAL_COMMUNITY): Payer: Self-pay | Admitting: Emergency Medicine

## 2021-09-01 ENCOUNTER — Emergency Department (HOSPITAL_COMMUNITY): Payer: Medicare Other

## 2021-09-01 ENCOUNTER — Other Ambulatory Visit: Payer: Self-pay

## 2021-09-01 DIAGNOSIS — Z7982 Long term (current) use of aspirin: Secondary | ICD-10-CM | POA: Diagnosis not present

## 2021-09-01 DIAGNOSIS — Z79899 Other long term (current) drug therapy: Secondary | ICD-10-CM | POA: Diagnosis not present

## 2021-09-01 DIAGNOSIS — J441 Chronic obstructive pulmonary disease with (acute) exacerbation: Secondary | ICD-10-CM | POA: Diagnosis not present

## 2021-09-01 DIAGNOSIS — R0602 Shortness of breath: Secondary | ICD-10-CM | POA: Diagnosis not present

## 2021-09-01 DIAGNOSIS — Z7951 Long term (current) use of inhaled steroids: Secondary | ICD-10-CM | POA: Diagnosis not present

## 2021-09-01 DIAGNOSIS — J45909 Unspecified asthma, uncomplicated: Secondary | ICD-10-CM | POA: Insufficient documentation

## 2021-09-01 LAB — TROPONIN I (HIGH SENSITIVITY): Troponin I (High Sensitivity): 6 ng/L (ref <18)

## 2021-09-01 LAB — CBC WITH DIFFERENTIAL/PLATELET
Abs Immature Granulocytes: 0.02 10*3/uL (ref 0.00–0.07)
Basophils Absolute: 0 10*3/uL (ref 0.0–0.1)
Basophils Relative: 1 %
Eosinophils Absolute: 0.1 10*3/uL (ref 0.0–0.5)
Eosinophils Relative: 1 %
HCT: 42.5 % (ref 39.0–52.0)
Hemoglobin: 13.9 g/dL (ref 13.0–17.0)
Immature Granulocytes: 1 %
Lymphocytes Relative: 21 %
Lymphs Abs: 0.9 10*3/uL (ref 0.7–4.0)
MCH: 27.9 pg (ref 26.0–34.0)
MCHC: 32.7 g/dL (ref 30.0–36.0)
MCV: 85.2 fL (ref 80.0–100.0)
Monocytes Absolute: 0.5 10*3/uL (ref 0.1–1.0)
Monocytes Relative: 11 %
Neutro Abs: 2.9 10*3/uL (ref 1.7–7.7)
Neutrophils Relative %: 65 %
Platelets: 245 10*3/uL (ref 150–400)
RBC: 4.99 MIL/uL (ref 4.22–5.81)
RDW: 14.2 % (ref 11.5–15.5)
WBC: 4.4 10*3/uL (ref 4.0–10.5)
nRBC: 0 % (ref 0.0–0.2)

## 2021-09-01 LAB — COMPREHENSIVE METABOLIC PANEL
ALT: 16 U/L (ref 0–44)
AST: 16 U/L (ref 15–41)
Albumin: 4.3 g/dL (ref 3.5–5.0)
Alkaline Phosphatase: 72 U/L (ref 38–126)
Anion gap: 8 (ref 5–15)
BUN: 10 mg/dL (ref 8–23)
CO2: 24 mmol/L (ref 22–32)
Calcium: 9 mg/dL (ref 8.9–10.3)
Chloride: 105 mmol/L (ref 98–111)
Creatinine, Ser: 0.83 mg/dL (ref 0.61–1.24)
GFR, Estimated: >60 mL/min (ref >60)
Glucose, Bld: 87 mg/dL (ref 70–99)
Potassium: 3.9 mmol/L (ref 3.5–5.1)
Sodium: 137 mmol/L (ref 135–145)
Total Bilirubin: 0.8 mg/dL (ref 0.3–1.2)
Total Protein: 6.8 g/dL (ref 6.5–8.1)

## 2021-09-01 LAB — D-DIMER, QUANTITATIVE: D-Dimer, Quant: 0.58 ug/mL-FEU — ABNORMAL HIGH (ref 0.00–0.50)

## 2021-09-01 MED ORDER — ALBUTEROL SULFATE HFA 108 (90 BASE) MCG/ACT IN AERS
2.0000 | INHALATION_SPRAY | RESPIRATORY_TRACT | Status: DC | PRN
  Filled 2021-09-01: qty 6.7

## 2021-09-01 NOTE — ED Provider Notes (Signed)
[facility] EMERGENCY DEPARTMENT Provider Note   CSN: [id number] Arrival date & time: 09/01/21  [id number]     History {Add pertinent medical, surgical, social history, OB history to HPI:1} Chief Complaint  Patient presents with   Shortness of Breath    Joel Donaldson is a 81 y.o. male.  Patient complains of some shortness of breath.  Patient has a history of asthma   Shortness of Breath      Home Medications Prior to Admission medications   Medication Sig Start Date End Date Taking? Authorizing Provider  acetaminophen (TYLENOL) 500 MG tablet Take 1,000 mg by mouth every 8 (eight) hours as needed for mild pain or moderate pain.    [provider]  albuterol (VENTOLIN HFA) 108 (90 Base) MCG/ACT inhaler INHALE 1 TO 2 PUFFS INTO THE LUNGS EVERY 4 HOURS AS NEEDED FOR WHEEZING OR SHORTNESS OF BREATH 07/05/21   Brimage, Vondra, DO  amLODipine (NORVASC) 5 MG tablet Take 1 tablet (5 mg total) by mouth daily. 02/15/21 03/17/21  Croitoru, Mihai, MD  aspirin EC 81 MG tablet Take 81 mg by mouth every morning.     [provider]  atorvastatin (LIPITOR) 20 MG tablet Take 1 tablet (20 mg total) by mouth daily. 01/10/21 04/10/21  [name], DO  buprenorphine (BUTRANS) 7.5 MCG/HR Place 1 patch onto the skin every Thursday. 05/17/20   [provider]  carbamide peroxide (DEBROX) 6.5 % OTIC solution Place 5 drops into the left ear 2 (two) times daily. 06/18/21   Dorie Rank, MD  diclofenac Sodium (VOLTAREN) 1 % GEL Apply 4 g topically 4 (four) times daily. 01/10/21   Brimage, Ronnette Juniper, DO  fluticasone (FLONASE) 50 MCG/ACT nasal spray SHAKE LIQUID AND USE 2 SPRAYS IN EACH NOSTRIL DAILY 01/10/21   Brimage, Vondra, DO  INCRUSE ELLIPTA 62.5 MCG/ACT AEPB INHALE 1 PUFF INTO THE LUNGS DAILY 07/26/21   Brimage, Vondra, DO  lidocaine (XYLOCAINE) 5 % ointment Apply 1 application topically daily as needed for mild pain or moderate pain. Patient not taking: Reported on  01/27/2021 01/10/21   [name], DO  lisinopril (ZESTRIL) 20 MG tablet TAKE 2 TABLETS('40MG'$ ) BY MOUTH DAILY 01/10/21   [name], DO  metoprolol succinate (TOPROL-XL) 50 MG 24 hr tablet Take 1 tablet (50 mg total) by mouth daily. 02/15/21 03/17/21  Croitoru, Mihai, MD  Multiple Vitamins-Minerals (ONE-A-DAY PROACTIVE 65+) TABS Take 1 tablet by mouth daily with breakfast.    [provider]  naproxen (NAPROSYN) 500 MG tablet Take 1 tablet (500 mg total) by mouth 2 (two) times daily with a meal. 05/25/21   Brimage, Vondra, DO  nitroGLYCERIN (NITROSTAT) 0.4 MG SL tablet Place 1 tablet (0.4 mg total) under the tongue every 5 (five) minutes as needed for chest pain (chest pain). 05/25/21   Brimage, Ronnette Juniper, DO  omeprazole (PRILOSEC) 40 MG capsule TAKE 1 CAPSULE(40 MG) BY MOUTH DAILY BEFORE BREAKFAST 01/10/21   Brimage, Vondra, DO  polyethylene glycol (MIRALAX / GLYCOLAX) 17 g packet Take 17 g by mouth daily as needed for mild constipation. 01/10/21   [name], DO  prochlorperazine (COMPAZINE) 10 MG tablet Take 1 tablet (10 mg total) by mouth every 6 (six) hours as needed for nausea or vomiting. 05/25/21   [name], DO      Allergies    Tramadol and Pollen extract    Review of Systems   Review of Systems  Respiratory:  Positive for shortness of breath.     Physical Exam  Updated Vital Signs BP (!) 137/91   Pulse (!) 53   Temp 98.1 F (36.7 C) (Oral)   Resp 16   SpO2 97%  Physical Exam  ED Results / Procedures / Treatments   Labs (all labs ordered are listed, but only abnormal results are displayed) Labs Reviewed  D-DIMER, QUANTITATIVE - Abnormal; Notable for the following components:      Result Value   D-Dimer, Quant 0.58 (*)    All other components within normal limits  CBC WITH DIFFERENTIAL/PLATELET  COMPREHENSIVE METABOLIC PANEL  TROPONIN I (HIGH SENSITIVITY)  TROPONIN I (HIGH SENSITIVITY)    EKG EKG Interpretation  Date/Time:  Friday September 01 2021  09:06:01 EDT Ventricular Rate:  62 PR Interval:  198 QRS Duration: 82 QT Interval:  424 QTC Calculation: 430 R Axis:   -10 Text Interpretation: Normal sinus rhythm Normal ECG When compared with ECG of 17-Jan-2021 18:09, PREVIOUS ECG IS PRESENT Confirmed by Milton Ferguson [phone]) on 09/01/2021 6:22:48 PM  Radiology DG Chest 2 View  Result Date: 09/01/2021 CLINICAL DATA:  sob EXAM: CHEST - 2 VIEW COMPARISON:  January 17, 2021 FINDINGS: The heart size and mediastinal contours are within normal limits. Low lung volume. No focal consolidation, pleural effusion or vascular congestion. The visualized skeletal structures are unremarkable. Old healed right rib fracture. IMPRESSION: No active cardiopulmonary disease. Electronically Signed   By: Frazier Richards M.D.   On: 09/01/2021 09:58    Procedures Procedures  {Document cardiac monitor, telemetry assessment procedure when appropriate:1}  Medications Ordered in ED Medications  albuterol (VENTOLIN HFA) 108 (90 Base) MCG/ACT inhaler 2 puff (has no administration in time range)    ED Course/ Medical Decision Making/ A&P Patient's labs unremarkable along with EKG and chest x-ray                         Medical Decision Making Amount and/or Complexity of Data Reviewed Labs: ordered. ECG/medicine tests: ordered.   Patient with asthma exacerbation minor.  Patient given an inhaler and will follow-up with his doctor  {Document critical care time when appropriate:1} {Document review of labs and clinical decision tools ie heart score, Chads2Vasc2 etc:1}  {Document your independent review of radiology images, and any outside records:1} {Document your discussion with family members, caretakers, and with consultants:1} {Document social determinants of health affecting pt's care:1} {Document your decision making why or why not admission, treatments were needed:1} Final Clinical Impression(s) / ED Diagnoses Final diagnoses:  COPD exacerbation ([place])     Rx / DC Orders ED Discharge Orders     None

## 2021-09-01 NOTE — ED Triage Notes (Signed)
Patient with history of asthma here with complaint of shortness of breath that started a few days ago. Patient states he used his home medications with minimal relief in discomfort. Patient is alert, oriented ,speaking in complete sentences, and is in no apparent distress at this time.

## 2021-09-01 NOTE — ED Provider Triage Note (Signed)
Emergency Medicine Provider Triage Evaluation Note  VERNA HAMON , a 81 y.o. male  was evaluated in triage.  Pt complains of shortness of breath since 3:00 this morning.  No associated cough or chest pain.  No history of DVT, recent surgery or travel.  No history of heart failure.  Non-smoker.  Says that he thinks he has a history of COPD.  Review of Systems  Positive: Shortness of breath Negative: Chest pain, palpitations, back pain, cough  Physical Exam  BP 137/81 (BP Location: Left Arm)   Pulse 68   Temp 98.1 F (36.7 C) (Oral)   Resp 16   SpO2 97%  Gen:   Awake, no distress   Resp:  Normal effort  MSK:   Moves extremities without difficulty  Other:  Lung sounds present and clear in all lung fields.  RRR, no murmurs or gallops  Medical Decision Making  Medically screening exam initiated at 9:26 AM.  Appropriate orders placed.  SOCRATES CAHOON was informed that the remainder of the evaluation will be completed by another provider, this initial triage assessment does not replace that evaluation, and the importance of remaining in the ED until their evaluation is complete.     Rhae Hammock, [place] 09/01/21 [phone]

## 2021-09-01 NOTE — Discharge Instructions (Signed)
Use the albuterol inhaler every 4-6 hours as needed and follow-up with your doctor next week for recheck

## 2021-09-19 ENCOUNTER — Other Ambulatory Visit: Payer: Self-pay | Admitting: Cardiovascular Disease

## 2021-09-26 ENCOUNTER — Emergency Department (HOSPITAL_COMMUNITY)
Admission: EM | Admit: 2021-09-26 | Discharge: 2021-09-26 | Disposition: A | Discharge status: Home / Self Care | Payer: Medicare Other | Attending: Emergency Medicine | Admitting: Emergency Medicine

## 2021-09-26 ENCOUNTER — Emergency Department (HOSPITAL_COMMUNITY): Payer: Medicare Other

## 2021-09-26 ENCOUNTER — Other Ambulatory Visit: Payer: Self-pay

## 2021-09-26 ENCOUNTER — Encounter (HOSPITAL_COMMUNITY): Payer: Self-pay

## 2021-09-26 DIAGNOSIS — J45909 Unspecified asthma, uncomplicated: Secondary | ICD-10-CM | POA: Diagnosis not present

## 2021-09-26 DIAGNOSIS — S0081XA Abrasion of other part of head, initial encounter: Secondary | ICD-10-CM | POA: Insufficient documentation

## 2021-09-26 DIAGNOSIS — Z79899 Other long term (current) drug therapy: Secondary | ICD-10-CM | POA: Insufficient documentation

## 2021-09-26 DIAGNOSIS — S0031XA Abrasion of nose, initial encounter: Secondary | ICD-10-CM | POA: Insufficient documentation

## 2021-09-26 DIAGNOSIS — W06XXXA Fall from bed, initial encounter: Secondary | ICD-10-CM | POA: Diagnosis not present

## 2021-09-26 DIAGNOSIS — S0990XA Unspecified injury of head, initial encounter: Secondary | ICD-10-CM

## 2021-09-26 DIAGNOSIS — Z8546 Personal history of malignant neoplasm of prostate: Secondary | ICD-10-CM | POA: Diagnosis not present

## 2021-09-26 DIAGNOSIS — Z7982 Long term (current) use of aspirin: Secondary | ICD-10-CM | POA: Diagnosis not present

## 2021-09-26 DIAGNOSIS — R519 Headache, unspecified: Secondary | ICD-10-CM | POA: Diagnosis not present

## 2021-09-26 DIAGNOSIS — I10 Essential (primary) hypertension: Secondary | ICD-10-CM | POA: Insufficient documentation

## 2021-09-26 DIAGNOSIS — W19XXXA Unspecified fall, initial encounter: Secondary | ICD-10-CM

## 2021-09-26 DIAGNOSIS — M542 Cervicalgia: Secondary | ICD-10-CM | POA: Insufficient documentation

## 2021-09-26 MED ORDER — ACETAMINOPHEN 325 MG PO TABS
650.0000 mg | ORAL_TABLET | Freq: Once | ORAL | Status: AC
  Administered 2021-09-26: 650 mg via ORAL
  Filled 2021-09-26: qty 2

## 2021-09-26 NOTE — ED Provider Triage Note (Signed)
Emergency Medicine Provider Triage Evaluation Note  KYSER WANDEL , a 81 y.o. male  was evaluated in triage.  Pt complains of abrasion to forehead and nose, right knee pain, rolled out of bed on accident today. Not on blood thinners, states he was "out for a second" was able to stand afterwards with assistance from granddaughter.   Review of Systems  Positive: As above, headache,neck pain Negative:   Physical Exam  BP 118/80 (BP Location: Right Arm)   Pulse 61   Temp 99.2 F (37.3 C) (Oral)   Resp 16   SpO2 95%  Gen:   Awake, no distress   Resp:  Normal effort  MSK:   Moves extremities without difficulty  Other:  Minor abrasion to forehead and bridge of nose, mild TTP anterior right knee. No midline neck/back tenderness   Medical Decision Making  Medically screening exam initiated at 11:05 AM.  Appropriate orders placed.  ANTRON SETH was informed that the remainder of the evaluation will be completed by another provider, this initial triage assessment does not replace that evaluation, and the importance of remaining in the ED until their evaluation is complete.     Tacy Learn, PA-C 09/26/21 1107

## 2021-09-26 NOTE — Discharge Instructions (Addendum)
Your exam today was reassuring.  Your CT scan of the head and neck did not show any concerning findings from the fall today.  You do have an abrasion on your forehead, and nose.  These did not require any repairs particularly stitches.  You can use Neosporin on these areas and they should heal well.  If you notice any concerning signs or symptoms such as worsening headache, persistent nausea vomiting you are unable to keep anything down please return to the emergency room for evaluation.  Given your injury you do not have any signs of concussion during your exam today but you may develop them.  In the case you do not have attached information regarding concussion.  And I have also given you information for concussion clinic ([facility]) to schedule follow-up if you need to.  Otherwise follow-up with your PCP.  Take Tylenol as you need to for your headache.

## 2021-09-26 NOTE — ED Triage Notes (Signed)
Pt reports falling out of his bed this morning. Pt hit his head. Pt also endorses right knee pain. Denies LOC and denies blood thinners.

## 2021-09-26 NOTE — ED Provider Notes (Signed)
[facility] DEPT Provider Note   CSN: [id number] Arrival date & time: 09/26/21  1006     History  Chief Complaint  Patient presents with   Joel Donaldson is a 81 y.o. male.  81 year old male presents today following a fall that occurred this morning.  Patient was asleep when he rolled over falling on hardwood floor.  Denies loss of consciousness.  He states his granddaughter was nearby.  No seizure-like activity reported.  Patient is not on anticoagulation.  Reports mild headache.  The history is provided by the patient. No language interpreter was used.       Home Medications Prior to Admission medications   Medication Sig Start Date End Date Taking? Authorizing Provider  acetaminophen (TYLENOL) 500 MG tablet Take 1,000 mg by mouth every 8 (eight) hours as needed for mild pain or moderate pain.    [provider]  albuterol (VENTOLIN HFA) 108 (90 Base) MCG/ACT inhaler INHALE 1 TO 2 PUFFS INTO THE LUNGS EVERY 4 HOURS AS NEEDED FOR WHEEZING OR SHORTNESS OF BREATH 07/05/21   Brimage, Vondra, DO  amLODipine (NORVASC) 5 MG tablet Take 1 tablet (5 mg total) by mouth daily. 02/15/21 03/17/21  Croitoru, Mihai, MD  aspirin EC 81 MG tablet Take 81 mg by mouth every morning.     [provider]  atorvastatin (LIPITOR) 20 MG tablet TAKE 1 TABLET(20 MG) BY MOUTH DAILY 09/20/21   Croitoru, Dani Gobble, MD  buprenorphine (BUTRANS) 7.5 MCG/HR Place 1 patch onto the skin every Thursday. 05/17/20   [provider]  carbamide peroxide (DEBROX) 6.5 % OTIC solution Place 5 drops into the left ear 2 (two) times daily. 06/18/21   Dorie Rank, MD  diclofenac Sodium (VOLTAREN) 1 % GEL Apply 4 g topically 4 (four) times daily. 01/10/21   Brimage, Ronnette Juniper, DO  fluticasone (FLONASE) 50 MCG/ACT nasal spray SHAKE LIQUID AND USE 2 SPRAYS IN EACH NOSTRIL DAILY 01/10/21   Brimage, Vondra, DO  INCRUSE ELLIPTA 62.5 MCG/ACT AEPB INHALE 1 PUFF INTO THE LUNGS DAILY  07/26/21   Brimage, Vondra, DO  lidocaine (XYLOCAINE) 5 % ointment Apply 1 application topically daily as needed for mild pain or moderate pain. Patient not taking: Reported on 01/27/2021 01/10/21   [name], DO  lisinopril (ZESTRIL) 20 MG tablet TAKE 2 TABLETS('40MG'$ ) BY MOUTH DAILY 01/10/21   [name], DO  metoprolol succinate (TOPROL-XL) 50 MG 24 hr tablet Take 1 tablet (50 mg total) by mouth daily. 02/15/21 03/17/21  Croitoru, Mihai, MD  Multiple Vitamins-Minerals (ONE-A-DAY PROACTIVE 65+) TABS Take 1 tablet by mouth daily with breakfast.    [provider]  naproxen (NAPROSYN) 500 MG tablet Take 1 tablet (500 mg total) by mouth 2 (two) times daily with a meal. 05/25/21   Brimage, Vondra, DO  nitroGLYCERIN (NITROSTAT) 0.4 MG SL tablet Place 1 tablet (0.4 mg total) under the tongue every 5 (five) minutes as needed for chest pain (chest pain). 05/25/21   Brimage, Ronnette Juniper, DO  omeprazole (PRILOSEC) 40 MG capsule TAKE 1 CAPSULE(40 MG) BY MOUTH DAILY BEFORE BREAKFAST 01/10/21   Brimage, Vondra, DO  polyethylene glycol (MIRALAX / GLYCOLAX) 17 g packet Take 17 g by mouth daily as needed for mild constipation. 01/10/21   [name], DO  prochlorperazine (COMPAZINE) 10 MG tablet Take 1 tablet (10 mg total) by mouth every 6 (six) hours as needed for nausea or vomiting. 05/25/21   [name], DO      Allergies  Tramadol and Pollen extract    Review of Systems   Review of Systems  Eyes:  Negative for visual disturbance.  Respiratory:  Negative for shortness of breath.   Cardiovascular:  Negative for chest pain.  Gastrointestinal:  Negative for nausea and vomiting.  Neurological:  Positive for headaches. Negative for seizures and syncope.  All other systems reviewed and are negative.   Physical Exam Updated Vital Signs BP 118/80 (BP Location: Right Arm)   Pulse 61   Temp 99.2 F (37.3 C) (Oral)   Resp 16   SpO2 95%  Physical Exam Vitals and nursing note reviewed.   Constitutional:      General: He is not in acute distress.    Appearance: Normal appearance. He is not ill-appearing.  HENT:     Head: Normocephalic and atraumatic.     Comments: Abrasion noted to mid forehead, and nasal bridge.  No laceration.    Nose: Nose normal.  Eyes:     General: No scleral icterus.    Extraocular Movements: Extraocular movements intact.     Conjunctiva/sclera: Conjunctivae normal.     Pupils: Pupils are equal, round, and reactive to light.   Cardiovascular:     Rate and Rhythm: Normal rate and regular rhythm.     Pulses: Normal pulses.  Pulmonary:     Effort: Pulmonary effort is normal. No respiratory distress.     Breath sounds: Normal breath sounds. No wheezing or rales.  Abdominal:     General: There is no distension.     Palpations: Abdomen is soft.     Tenderness: There is no abdominal tenderness. There is no guarding.  Musculoskeletal:        General: Normal range of motion.     Cervical back: Normal range of motion.     Comments: Mild cervical spinal process tenderness.  Thoracic and lumbar spine without tenderness to palpation.  Full range of motion bilateral upper and lower extremities.  Intact strength that is symmetrical in bilateral upper and lower extremities and extensor and flexor muscle groups.  Patient is able to ambulate without difficulty.  All major joints including shoulders, elbows, wrists, hips, knees, ankles without tenderness to palpation and with full range of motion.  Skin:    General: Skin is warm and dry.  Neurological:     General: No focal deficit present.     Mental Status: He is alert. Mental status is at baseline.     ED Results / Procedures / Treatments   Labs (all labs ordered are listed, but only abnormal results are displayed) Labs Reviewed - No data to display  EKG None  Radiology No results found.  Procedures Procedures    Medications Ordered in ED Medications - No data to display  ED Course/  Medical Decision Making/ A&P                           Medical Decision Making Risk OTC drugs.   Medical Decision Making / ED Course   This patient presents to the ED for concern of fall, headache, this involves an extensive number of treatment options, and is a complaint that carries with it a high risk of complications and morbidity.  The differential diagnosis includes cervical spine fracture, intracranial hemorrhage, concussion, posttraumatic headache  MDM: 80 year old male presents today for evaluation of headache following a fall that occurred this morning.  Patient rolled out of bed.  Denies loss of consciousness.  Does not take anticoagulation.  Has small abrasion on his forehead, and on nasal bridge.  These are superficial.  No laceration noted.  Full range of motion of bilateral upper and lower extremities in all major joints which are without tenderness to palpation.  Has mild tenderness palpation of his cervical spine only.  Reports mild headache.  Will provide dose of Tylenol.  CT head, cervical spine ordered.  Patient is able ambulate without difficulty.  Has a brace over his right knee for chronic bilateral knee pain.  He denies any worsening of his knee pain since the fall this morning.  CT head, cervical spine without acute findings.  Patient likely has posttraumatic headache, given mechanism of fall he could also develop a concussion.  Discussed importance of follow-up with his PCP.  Concussion clinic information provided to patient.  Discussed wound care for his abrasions using Neosporin.  Return precautions discussed.  Patient voices understanding and is in agreement with plan.   Lab Tests: -I ordered, reviewed, and interpreted labs.   The pertinent results include:   Labs Reviewed - No data to display    EKG  EKG Interpretation  Date/Time:    Ventricular Rate:    PR Interval:    QRS Duration:   QT Interval:    QTC Calculation:   R Axis:     Text Interpretation:            Imaging Studies ordered: I ordered imaging studies including CT head, CT cervical spine I independently visualized and interpreted imaging. I agree with the radiologist interpretation   Medicines ordered and prescription drug management: Meds ordered this encounter  Medications   acetaminophen (TYLENOL) tablet 650 mg    -I have reviewed the patients home medicines and have made adjustments as needed  Reevaluation: After the interventions noted above, I reevaluated the patient and found that they have :improved  Co morbidities that complicate the patient evaluation  Past Medical History:  Diagnosis Date   Alcohol abuse    Anxiety    Arthritis    hands, back, knees   Asthma    Depression    GERD (gastroesophageal reflux disease)    Glaucoma    Heart murmur asymptomatic   Hx of radioisotope therapy 07/26/11   Radiosactive Prostate Seed Implant/ I-125 Seeds   Hypertension    Neck pain 10/22/2019   Prostate cancer ([place]) dx'd 2016 or 2017   seed implants   Rheumatoid arthritis ([place]) 10/10/2012      Dispostion: Patient is appropriate for discharge.  Discharged in stable condition.  Return precautions discussed.     Final Clinical Impression(s) / ED Diagnoses Final diagnoses:  Fall, initial encounter  Injury of head, initial encounter    Rx / DC Orders ED Discharge Orders     None         Evlyn Courier, PA-C 09/26/21 1359    Dorie Rank, MD 09/26/21 [phone]

## 2021-09-29 DIAGNOSIS — M546 Pain in thoracic spine: Secondary | ICD-10-CM | POA: Diagnosis not present

## 2021-09-29 DIAGNOSIS — M25561 Pain in right knee: Secondary | ICD-10-CM | POA: Diagnosis not present

## 2021-09-29 DIAGNOSIS — Z9181 History of falling: Secondary | ICD-10-CM | POA: Diagnosis not present

## 2021-09-29 DIAGNOSIS — M25512 Pain in left shoulder: Secondary | ICD-10-CM | POA: Diagnosis not present

## 2021-09-29 DIAGNOSIS — Z79899 Other long term (current) drug therapy: Secondary | ICD-10-CM | POA: Diagnosis not present

## 2021-09-29 DIAGNOSIS — G894 Chronic pain syndrome: Secondary | ICD-10-CM | POA: Diagnosis not present

## 2021-09-29 DIAGNOSIS — M25511 Pain in right shoulder: Secondary | ICD-10-CM | POA: Diagnosis not present

## 2021-09-29 DIAGNOSIS — E559 Vitamin D deficiency, unspecified: Secondary | ICD-10-CM | POA: Diagnosis not present

## 2021-09-29 DIAGNOSIS — M545 Low back pain, unspecified: Secondary | ICD-10-CM | POA: Diagnosis not present

## 2021-09-29 DIAGNOSIS — M25562 Pain in left knee: Secondary | ICD-10-CM | POA: Diagnosis not present

## 2021-09-29 DIAGNOSIS — G8929 Other chronic pain: Secondary | ICD-10-CM | POA: Diagnosis not present

## 2021-10-04 DIAGNOSIS — Z79899 Other long term (current) drug therapy: Secondary | ICD-10-CM | POA: Diagnosis not present

## 2021-10-14 ENCOUNTER — Encounter (HOSPITAL_COMMUNITY): Payer: Self-pay

## 2021-10-14 ENCOUNTER — Emergency Department (HOSPITAL_COMMUNITY): Payer: Medicare Other

## 2021-10-14 ENCOUNTER — Emergency Department (HOSPITAL_COMMUNITY)
Admission: EM | Admit: 2021-10-14 | Discharge: 2021-10-14 | Disposition: A | Discharge status: Home / Self Care | Payer: Medicare Other | Attending: Emergency Medicine | Admitting: Emergency Medicine

## 2021-10-14 DIAGNOSIS — Z7982 Long term (current) use of aspirin: Secondary | ICD-10-CM | POA: Insufficient documentation

## 2021-10-14 DIAGNOSIS — S4991XA Unspecified injury of right shoulder and upper arm, initial encounter: Secondary | ICD-10-CM | POA: Diagnosis not present

## 2021-10-14 DIAGNOSIS — W548XXA Other contact with dog, initial encounter: Secondary | ICD-10-CM | POA: Diagnosis not present

## 2021-10-14 DIAGNOSIS — I672 Cerebral atherosclerosis: Secondary | ICD-10-CM | POA: Diagnosis not present

## 2021-10-14 DIAGNOSIS — R519 Headache, unspecified: Secondary | ICD-10-CM | POA: Diagnosis not present

## 2021-10-14 DIAGNOSIS — S46911A Strain of unspecified muscle, fascia and tendon at shoulder and upper arm level, right arm, initial encounter: Secondary | ICD-10-CM | POA: Insufficient documentation

## 2021-10-14 DIAGNOSIS — Y92000 Kitchen of unspecified non-institutional (private) residence as  the place of occurrence of the external cause: Secondary | ICD-10-CM | POA: Diagnosis not present

## 2021-10-14 DIAGNOSIS — S01112A Laceration without foreign body of left eyelid and periocular area, initial encounter: Secondary | ICD-10-CM | POA: Insufficient documentation

## 2021-10-14 DIAGNOSIS — W19XXXA Unspecified fall, initial encounter: Secondary | ICD-10-CM

## 2021-10-14 DIAGNOSIS — S46912A Strain of unspecified muscle, fascia and tendon at shoulder and upper arm level, left arm, initial encounter: Secondary | ICD-10-CM | POA: Diagnosis not present

## 2021-10-14 DIAGNOSIS — Y9301 Activity, walking, marching and hiking: Secondary | ICD-10-CM | POA: Insufficient documentation

## 2021-10-14 DIAGNOSIS — S0181XA Laceration without foreign body of other part of head, initial encounter: Secondary | ICD-10-CM

## 2021-10-14 DIAGNOSIS — S0993XA Unspecified injury of face, initial encounter: Secondary | ICD-10-CM | POA: Diagnosis present

## 2021-10-14 DIAGNOSIS — M25532 Pain in left wrist: Secondary | ICD-10-CM | POA: Diagnosis not present

## 2021-10-14 DIAGNOSIS — S66912A Strain of unspecified muscle, fascia and tendon at wrist and hand level, left hand, initial encounter: Secondary | ICD-10-CM | POA: Diagnosis not present

## 2021-10-14 MED ORDER — ACETAMINOPHEN 500 MG PO TABS: 1000.0000 mg | ORAL_TABLET | Freq: Four times a day (QID) | ORAL | 0 refills | Status: DC | PRN

## 2021-10-14 MED ORDER — BACITRACIN ZINC 500 UNIT/GM EX OINT: 1.0000 | TOPICAL_OINTMENT | Freq: Two times a day (BID) | CUTANEOUS | 0 refills | Status: DC

## 2021-10-14 MED ORDER — LIDOCAINE HCL (PF) 1 % IJ SOLN
5.0000 mL | Freq: Once | INTRAMUSCULAR | Status: AC
  Administered 2021-10-14: 5 mL
  Filled 2021-10-14: qty 30

## 2021-10-14 NOTE — Discharge Instructions (Addendum)
1.  See your doctor or return to the emergency department to have your stitches taken out in 7 to 10 days.  You may clean the wound and apply bacitracin ointment as prescribed twice a day. 2.  Follow head injury instructions.  Return if you get a bad headache, confusion, vomiting or any other concerning changes. 3.  You appear to have a strain of your wrist and shoulder.  No broken bones were identified.  Try to ice and elevate is much as possible and take acetaminophen for pain.  Follow-up with your doctor for recheck to see if any further evaluation is needed.

## 2021-10-14 NOTE — ED Triage Notes (Signed)
Pt reports that he got tripped up on his dog and hit his head on a chair. Pt denies blood thinners, no LOC. Pt with laceration noted above L eye. Pt also c/o R shoulder and wrist pain.

## 2021-10-14 NOTE — ED Provider Notes (Signed)
[facility] Provider Note   CSN: [id number] Arrival date & time: 10/14/21  0859     History  Chief Complaint  Patient presents with   Fall   Shoulder Injury   Facial Laceration    Joel Donaldson is a 81 y.o. male.  HPI Patient was walking in the kitchen this morning and his dog ran in front of him causing him to trip and fall.  He hit the corner of a chair with his left brow and fell to the floor.  He reports he broke the fall with his arms.  He has some pain in the left wrist and the right shoulder.  He reports are uncomfortable but he is able to use both of them.  He reports he was able to get up off the floor and walk afterwards.  He has no problems at this time with chest pain abdominal pain back pain hip pain or difficulty walking.  Reports he chronically has some pain in his hips and knees but does not think is any worse than previously.  Patient is not taking any blood thinner medications.    Home Medications Prior to Admission medications   Medication Sig Start Date End Date Taking? Authorizing Provider  acetaminophen (TYLENOL) 500 MG tablet Take 2 tablets (1,000 mg total) by mouth every 6 (six) hours as needed. 10/14/21  Yes Countess Biebel, Jeannie Done, MD  bacitracin ointment Apply 1 Application topically 2 (two) times daily. 10/14/21  Yes Charlesetta Shanks, MD  acetaminophen (TYLENOL) 500 MG tablet Take 1,000 mg by mouth every 8 (eight) hours as needed for mild pain or moderate pain.    [provider]  albuterol (VENTOLIN HFA) 108 (90 Base) MCG/ACT inhaler INHALE 1 TO 2 PUFFS INTO THE LUNGS EVERY 4 HOURS AS NEEDED FOR WHEEZING OR SHORTNESS OF BREATH 07/05/21   Brimage, Vondra, DO  amLODipine (NORVASC) 5 MG tablet Take 1 tablet (5 mg total) by mouth daily. 02/15/21 03/17/21  Croitoru, Mihai, MD  aspirin EC 81 MG tablet Take 81 mg by mouth every morning.     [provider]  atorvastatin (LIPITOR) 20 MG tablet TAKE 1 TABLET(20 MG) BY MOUTH  DAILY 09/20/21   Croitoru, Dani Gobble, MD  buprenorphine (BUTRANS) 7.5 MCG/HR Place 1 patch onto the skin every Thursday. 05/17/20   [provider]  carbamide peroxide (DEBROX) 6.5 % OTIC solution Place 5 drops into the left ear 2 (two) times daily. 06/18/21   Dorie Rank, MD  diclofenac Sodium (VOLTAREN) 1 % GEL Apply 4 g topically 4 (four) times daily. 01/10/21   Brimage, Ronnette Juniper, DO  fluticasone (FLONASE) 50 MCG/ACT nasal spray SHAKE LIQUID AND USE 2 SPRAYS IN EACH NOSTRIL DAILY 01/10/21   Brimage, Vondra, DO  INCRUSE ELLIPTA 62.5 MCG/ACT AEPB INHALE 1 PUFF INTO THE LUNGS DAILY 07/26/21   Brimage, Vondra, DO  lidocaine (XYLOCAINE) 5 % ointment Apply 1 application topically daily as needed for mild pain or moderate pain. Patient not taking: Reported on 01/27/2021 01/10/21   [name], DO  lisinopril (ZESTRIL) 20 MG tablet TAKE 2 TABLETS('40MG'$ ) BY MOUTH DAILY 01/10/21   [name], DO  metoprolol succinate (TOPROL-XL) 50 MG 24 hr tablet Take 1 tablet (50 mg total) by mouth daily. 02/15/21 03/17/21  Croitoru, Mihai, MD  Multiple Vitamins-Minerals (ONE-A-DAY PROACTIVE 65+) TABS Take 1 tablet by mouth daily with breakfast.    [provider]  naproxen (NAPROSYN) 500 MG tablet Take 1 tablet (500 mg total) by mouth 2 (two) times  daily with a meal. 05/25/21   Brimage, Ronnette Juniper, DO  nitroGLYCERIN (NITROSTAT) 0.4 MG SL tablet Place 1 tablet (0.4 mg total) under the tongue every 5 (five) minutes as needed for chest pain (chest pain). 05/25/21   Brimage, Ronnette Juniper, DO  omeprazole (PRILOSEC) 40 MG capsule TAKE 1 CAPSULE(40 MG) BY MOUTH DAILY BEFORE BREAKFAST 01/10/21   Brimage, Vondra, DO  polyethylene glycol (MIRALAX / GLYCOLAX) 17 g packet Take 17 g by mouth daily as needed for mild constipation. 01/10/21   [name], DO  prochlorperazine (COMPAZINE) 10 MG tablet Take 1 tablet (10 mg total) by mouth every 6 (six) hours as needed for nausea or vomiting. 05/25/21   [name], DO       Allergies    Tramadol and Pollen extract    Review of Systems   Review of Systems Constitutional: No fever no chills no malaise Neurologic: No loss of consciousness no confusion no focal weakness numbness or tingling Respiratory: No chest pain no shortness of breath no cough Physical Exam Updated Vital Signs BP 124/76   Pulse (!) 56   Temp (!) 97.3 F (36.3 C) (Oral)   Resp 18   SpO2 95%  Physical Exam Constitutional:      Comments: GCS 15.  Alert nontoxic.  Mental status clear.  No acute distress.  HENT:     Head:     Comments: Patient has hematoma at the corner of the left brow.  Overlying brow laceration with a small flap at the lateral aspect, Boderline stellate.  Approximately 1 cm and irregular.  No active bleeding.  Laceration is to the deeper muscles but not through to skull.    Nose: Nose normal.     Mouth/Throat:     Mouth: Mucous membranes are moist.     Pharynx: Oropharynx is clear.  Eyes:     Extraocular Movements: Extraocular movements intact.     Pupils: Pupils are equal, round, and reactive to light.  Neck:     Comments: No midline C-spine tenderness. Cardiovascular:     Rate and Rhythm: Normal rate and regular rhythm.  Pulmonary:     Effort: Pulmonary effort is normal.     Breath sounds: Normal breath sounds.  Chest:     Chest wall: No tenderness.  Abdominal:     General: There is no distension.     Palpations: Abdomen is soft.     Tenderness: There is no abdominal tenderness. There is no guarding.  Musculoskeletal:     Comments: Patient endorses discomfort over the right point of the shoulder.  No appearance of dislocation or significant deformity.  Patient can move the shoulder with flexion extension and abduction and abduction but limited range of motion for forward flexion and pain with abduction beyond 45 degrees.  Patient endorses some discomfort with range of motion of the left wrist but range of motion is intact.  Patient does have changes  consistent with significant arthritis but does not appear to have significant deformity.  Patient can spontaneously flex and extend both lower extremities at the knee hip and ankle.  Skin:    General: Skin is warm and dry.  Neurological:     General: No focal deficit present.     Mental Status: He is oriented to person, place, and time.     Motor: No weakness.     Coordination: Coordination normal.  Psychiatric:        Mood and Affect: Mood normal.     ED Results /  Procedures / Treatments   Labs (all labs ordered are listed, but only abnormal results are displayed) Labs Reviewed - No data to display  EKG None  Radiology DG Wrist Complete Left  Result Date: 10/14/2021 CLINICAL DATA:  Pt reported that he got tripped up on his dog, fell, and hit his head on a chair. Pt had a laceration noted above his left eye. Pt also c/o right shoulder and left wrist pain. EXAM: LEFT WRIST - COMPLETE 3+ VIEW COMPARISON:  None Available. FINDINGS: No convincing fracture. Widened scapholunate interval to 4-5 mm. Sclerosis of the lunate and adjacent hamate. Joint space narrowing with mild subchondral sclerosis small marginal osteophytes at the trapezium first metacarpal articulation, to lesser degree at the scaphoid, trapezium trapezoid articulations. Blunted ulnar styloid, possibly an old fracture. IMPRESSION: 1. No acute fracture or dislocation. 2. Widened scapholunate interval consistent with chronic scapholunate ligament disruption. 3. Degenerative changes as detailed. Probable old ulnar styloid fracture. Electronically Signed   By: Lajean Manes M.D.   On: 10/14/2021 10:28   DG Shoulder Right  Result Date: 10/14/2021 CLINICAL DATA:  Pt reported that he got tripped up on his dog, fell, and hit his head on a chair. Pt had a laceration noted above his left eye. Pt also c/o right shoulder and left wrist pain. EXAM: RIGHT SHOULDER - 2+ VIEW COMPARISON:  None Available. FINDINGS: No fracture or dislocation.  Marked narrowing of the subacromial space with concave remodeling of the overlying the chromium. Narrowed glenohumeral joint. Mild AC joint osteoarthritis. Soft tissues are unremarkable. IMPRESSION: 1. No fracture or acute finding. 2. Marked narrowing of the subacromial space consistent with a chronic full-thickness rotator cuff tear. Electronically Signed   By: Lajean Manes M.D.   On: 10/14/2021 10:25   CT Head Wo Contrast  Result Date: 10/14/2021 CLINICAL DATA:  81 year old male status post fall with laceration above left eye, pain. EXAM: CT HEAD WITHOUT CONTRAST TECHNIQUE: Contiguous axial images were obtained from the base of the skull through the vertex without intravenous contrast. RADIATION DOSE REDUCTION: This exam was performed according to the departmental dose-optimization program which includes automated exposure control, adjustment of the mA and/or kV according to patient size and/or use of iterative reconstruction technique. COMPARISON:  Head CT 09/26/2021. FINDINGS: Brain: Cerebral volume remains normal for age. No midline shift, ventriculomegaly, mass effect, evidence of mass lesion, intracranial hemorrhage or evidence of cortically based acute infarction. Stable gray-white matter differentiation throughout the brain. Mild to moderate for age patchy white matter changes. Vascular: Calcified atherosclerosis at the skull base. No suspicious intracranial vascular hyperdensity. Skull: No acute osseous abnormality identified. Sinuses/Orbits: Visualized paranasal sinuses and mastoids are stable and well aerated. Other: Orbit and scalp soft tissues appears stable. No scalp soft tissue gas. IMPRESSION: 1. No acute traumatic injury identified. 2. Stable non contrast CT appearance of the brain with mild to moderate for age white matter changes. Electronically Signed   By: Genevie Ann M.D.   On: 10/14/2021 10:02    Procedures .Marland KitchenLaceration Repair  Date/Time: 10/14/2021 12:11 PM  Performed by: Charlesetta Shanks, MD Authorized by: Charlesetta Shanks, MD   Consent:    Consent obtained:  Verbal   Consent given by:  Patient   Risks discussed:  Infection, pain, poor cosmetic result and poor wound healing Anesthesia:    Anesthesia method:  Local infiltration   Local anesthetic:  Lidocaine 1% w/o epi Laceration details:    Location:  Face   Face location:  L eyebrow  Length (cm):  1   Depth (mm):  5 Exploration:    Wound extent: areolar tissue violated     Contaminated: no   Treatment:    Area cleansed with:  Shur-Clens   Amount of cleaning:  Standard Skin repair:    Repair method:  Sutures   Suture size:  4-0   Suture material:  Nylon   Suture technique:  Simple interrupted   Number of sutures:  4 Approximation:    Approximation:  Close Repair type:    Repair type:  Intermediate Post-procedure details:    Dressing:  Open (no dressing)   Procedure completion:  Tolerated well, no immediate complications     Medications Ordered in ED Medications  lidocaine (PF) (XYLOCAINE) 1 % injection 5 mL (5 mLs Infiltration Given by Other 10/14/21 1100)    ED Course/ Medical Decision Making/ A&P                           Medical Decision Making Amount and/or Complexity of Data Reviewed Radiology: ordered.  Risk OTC drugs. Prescription drug management.   Patient mechanical fall.  He did strike his head and has a forehead laceration.  Mental status is clear.  He is not on anticoagulants.  He does not endorse loss of consciousness.  With significant fall and head injury and patient's age we will proceed with CT head.  Patient also complains of some pain at the right shoulder and left wrist.  Patient has some diminished range of motion at the right shoulder but no apparent deformity.  We will proceed with x-ray.  Also x-ray left wrist for pain.  No evident deformity.  CT returned without acute traumatic injuries.  X-rays reviewed by radiology of shoulder and wrist show chronic changes but no  likely new changes.  Chronic changes of chronic rotator cuff dysfunction and chronic scapholunate disruption.  On clinical exam I do not suspect acute severe ligamentous or tendinous injuries to the shoulder or wrist and have higher suspicion that there is acute on chronic strain.  Laceration repaired as outlined.  Patient tolerated well.  He is in good condition.  Patient son is at bedside.  At this time he is stable for discharge.  Mental status is clear.  He has good function and we reviewed of follow-up plan and return precautions.        Final Clinical Impression(s) / ED Diagnoses Final diagnoses:  Fall, initial encounter  Facial laceration, initial encounter  Strain of right shoulder, initial encounter  Strain of left wrist, initial encounter    Rx / DC Orders ED Discharge Orders          Ordered    bacitracin ointment  2 times daily        10/14/21 1204    acetaminophen (TYLENOL) 500 MG tablet  Every 6 hours PRN        10/14/21 1204              Charlesetta Shanks, MD 10/14/21 1217

## 2021-10-17 ENCOUNTER — Telehealth: Payer: Self-pay

## 2021-10-17 NOTE — Patient Outreach (Signed)
  Care Coordination TOC Note Transition Care Management Unsuccessful Follow-up Telephone Call  Date of discharge and from where:  [facility] 10/14/21  Attempts:  1st Attempt  Reason for unsuccessful TCM follow-up call:  Unable to leave message  Johnney Killian, RN, BSN, CCM Care Management Coordinator [facility] Phone: [phone]: [phone]

## 2021-10-20 ENCOUNTER — Telehealth: Payer: Self-pay

## 2021-10-20 NOTE — Telephone Encounter (Signed)
     Patient  visit on 9/2  at [facility] was for fall  Have you been able to follow up with your primary care physician?yes  The patient was or was not able to obtain any needed medicine or equipment.na na Are there diet recommendations that you are having difficulty following?  Patient expresses understanding of discharge instructions and education provided has no other needs at this time. yes     [facility], Care Management  [phone], [place], [place] [address] Phone: [phone] Email: [email or web address]

## 2021-10-23 ENCOUNTER — Ambulatory Visit (INDEPENDENT_AMBULATORY_CARE_PROVIDER_SITE_OTHER): Payer: Medicare Other | Admitting: Student

## 2021-10-23 ENCOUNTER — Encounter: Payer: Self-pay | Admitting: Student

## 2021-10-23 ENCOUNTER — Other Ambulatory Visit: Payer: Self-pay | Admitting: Student

## 2021-10-23 ENCOUNTER — Telehealth: Payer: Self-pay | Admitting: Student

## 2021-10-23 VITALS — BP 150/82 | HR 63 | Ht 70.0 in | Wt 183.8 lb

## 2021-10-23 DIAGNOSIS — W19XXXD Unspecified fall, subsequent encounter: Secondary | ICD-10-CM

## 2021-10-23 DIAGNOSIS — J449 Chronic obstructive pulmonary disease, unspecified: Secondary | ICD-10-CM | POA: Diagnosis not present

## 2021-10-23 DIAGNOSIS — W19XXXA Unspecified fall, initial encounter: Secondary | ICD-10-CM | POA: Insufficient documentation

## 2021-10-23 HISTORY — DX: Unspecified fall, initial encounter: W19.XXXA

## 2021-10-23 MED ORDER — ALBUTEROL SULFATE HFA 108 (90 BASE) MCG/ACT IN AERS: INHALATION_SPRAY | RESPIRATORY_TRACT | 3 refills | Status: DC

## 2021-10-23 MED ORDER — INCRUSE ELLIPTA 62.5 MCG/ACT IN AEPB: 1.0000 | INHALATION_SPRAY | Freq: Every day | RESPIRATORY_TRACT | 2 refills | Status: DC

## 2021-10-23 NOTE — Patient Instructions (Signed)
It was great to see you! Thank you for allowing me to participate in your care!   I recommend that you always bring your medications to each appointment as this makes it easy to ensure we are on the correct medications and helps [place] not miss when refills are needed.  Our plans for today:  - You will receive call from our care management to help you with home health needs. - You will receive a call from our office to schedule an appointment with our geriatric clinic. -Make an appointment to see your PCP in the next 4-6 weeks.   Take care and seek immediate care sooner if you develop any concerns. Please remember to show up 15 minutes before your scheduled appointment time!  Leslie Dales, DO [facility]

## 2021-10-23 NOTE — Assessment & Plan Note (Signed)
Worsening SOB with exertion. Compliant on inhalers and needs refill. He would benefit from a more focused visit. -Refilled inhalers -Make appt with PCP -Consider ambulatory pulse ox for home oxygen

## 2021-10-23 NOTE — Telephone Encounter (Signed)
Erroneous encounter

## 2021-10-23 NOTE — Assessment & Plan Note (Addendum)
>  3 falls in 2 months. Sutures removed from head lac today-no problems. States he has no one to help him at home, except his 2 hour a day aid. Very concerned that patient is not meeting his ADLs, may need DME equipment. Would benefit from CCM and Geriatric clinic to coordinate care. -CCM referral -Geriatric clinic referral

## 2021-10-23 NOTE — Progress Notes (Cosign Needed Addendum)
    SUBJECTIVE:   CHIEF COMPLAINT / HPI: Removal of stitches  Fall Patient f/u for removal of stitches after head lac secondary to mechanical fall 9 days ago. Patient endorses additional falls in August (one for which he went to the ED). He only gets 2 hours a day of home aid, and feels like he needs more help. Denies dizziness, syncope, chest pain, SOB with falls. Denies tobacco, alcohol, drug use.  COPD Patient needs refills of albuterol and Incruse Ellipta. Compliant on inhalers, without side-effects. Endorses worsening shortness of breat with exertion, but denies chest pain/tightness. Does not have home O2.   ROS: Negative except for those mentioned above.  PERTINENT  PMH / PSH: COPD, HTN  OBJECTIVE:   BP (!) 150/82   Pulse 63   Ht '5\' 10"'$  (1.778 m)   Wt 183 lb 12.8 oz (83.4 kg)   SpO2 100%   BMI 26.37 kg/m    Gen: NAD, chronically ill appearing Cardio: RRR, no rmg. SOB with exertion. Pulm: Normal work of breathing at rest, CTAB MSK: Significant valgus deformity of right knee. Patient is able to stand and sit without assistance, normal speed while walking, stable gait with cane use.  ASSESSMENT/PLAN:   COPD (chronic obstructive pulmonary disease) (HCC) Worsening SOB with exertion. Compliant on inhalers and needs refill. He would benefit from a more focused visit. -Refilled inhalers -Make appt with PCP -Consider ambulatory pulse ox for home oxygen   Fall >3 falls in 2 months. Sutures removed from head lac today-no problems. States he has no one to help him at home, except his 2 hour a day aid. Very concerned that patient is not meeting his ADLs, may need DME equipment. Would benefit from CCM and Geriatric clinic to coordinate care. -CCM referral -Geriatric clinic referral    Leslie Dales, [place]

## 2021-10-24 ENCOUNTER — Telehealth: Payer: Self-pay | Admitting: *Deleted

## 2021-10-24 NOTE — Chronic Care Management (AMB) (Signed)
  Care Coordination  Outreach Note  10/24/2021 Name: JACOBEY GURA MRN: [id number] DOB: 01-Sep-1940   Care Coordination Outreach Attempts: An unsuccessful telephone outreach was attempted today to offer the patient information about available care coordination services as a benefit of their health plan.   Follow Up Plan:  Additional outreach attempts will be made to offer the patient care coordination information and services.   Encounter Outcome:  No Answer   [place]  Direct Dial: [phone]

## 2021-10-26 ENCOUNTER — Ambulatory Visit: Payer: Self-pay | Admitting: Licensed Clinical Social Worker

## 2021-10-26 NOTE — Chronic Care Management (AMB) (Signed)
  Care Coordination   Note   10/26/2021 Name: MARKES SHATSWELL MRN: [id number] DOB: May 31, 1940  TRESTON COKER is a 81 y.o. year old male who sees Gerrit Heck, MD for primary care. I reached out to [name] by phone today to offer care coordination services.  Mr. Fye was given information about Care Coordination services today including:   The Care Coordination services include support from the care team which includes your Nurse Coordinator, Clinical Social Worker, or Pharmacist.  The Care Coordination team is here to help remove barriers to the health concerns and goals most important to you. Care Coordination services are voluntary, and the patient may decline or stop services at any time by request to their care team member.   Care Coordination Consent Status: Patient agreed to services and verbal consent obtained.   Follow up plan:  Telephone appointment with care coordination team member scheduled for:  10/26/21  Encounter Outcome:  Pt. Scheduled  [place]  Direct Dial: [phone]

## 2021-10-30 ENCOUNTER — Ambulatory Visit: Payer: Self-pay | Admitting: Licensed Clinical Social Worker

## 2021-10-30 DIAGNOSIS — M25561 Pain in right knee: Secondary | ICD-10-CM | POA: Diagnosis not present

## 2021-10-30 DIAGNOSIS — Z9181 History of falling: Secondary | ICD-10-CM | POA: Diagnosis not present

## 2021-10-30 DIAGNOSIS — E559 Vitamin D deficiency, unspecified: Secondary | ICD-10-CM | POA: Diagnosis not present

## 2021-10-30 DIAGNOSIS — M545 Low back pain, unspecified: Secondary | ICD-10-CM | POA: Diagnosis not present

## 2021-10-30 DIAGNOSIS — G894 Chronic pain syndrome: Secondary | ICD-10-CM | POA: Diagnosis not present

## 2021-10-30 DIAGNOSIS — Z79899 Other long term (current) drug therapy: Secondary | ICD-10-CM | POA: Diagnosis not present

## 2021-10-30 DIAGNOSIS — M25562 Pain in left knee: Secondary | ICD-10-CM | POA: Diagnosis not present

## 2021-10-30 DIAGNOSIS — G8929 Other chronic pain: Secondary | ICD-10-CM | POA: Diagnosis not present

## 2021-10-30 IMAGING — DX DG PORTABLE PELVIS
1 series · 1 of 1 positions shown · non-contrast
Comparison: Portable exam 9591 hours without priors for comparison

CLINICAL DATA: Hip pain since falling off bed last night

EXAM:
PORTABLE PELVIS 1-2 VIEWS

[pelvis]
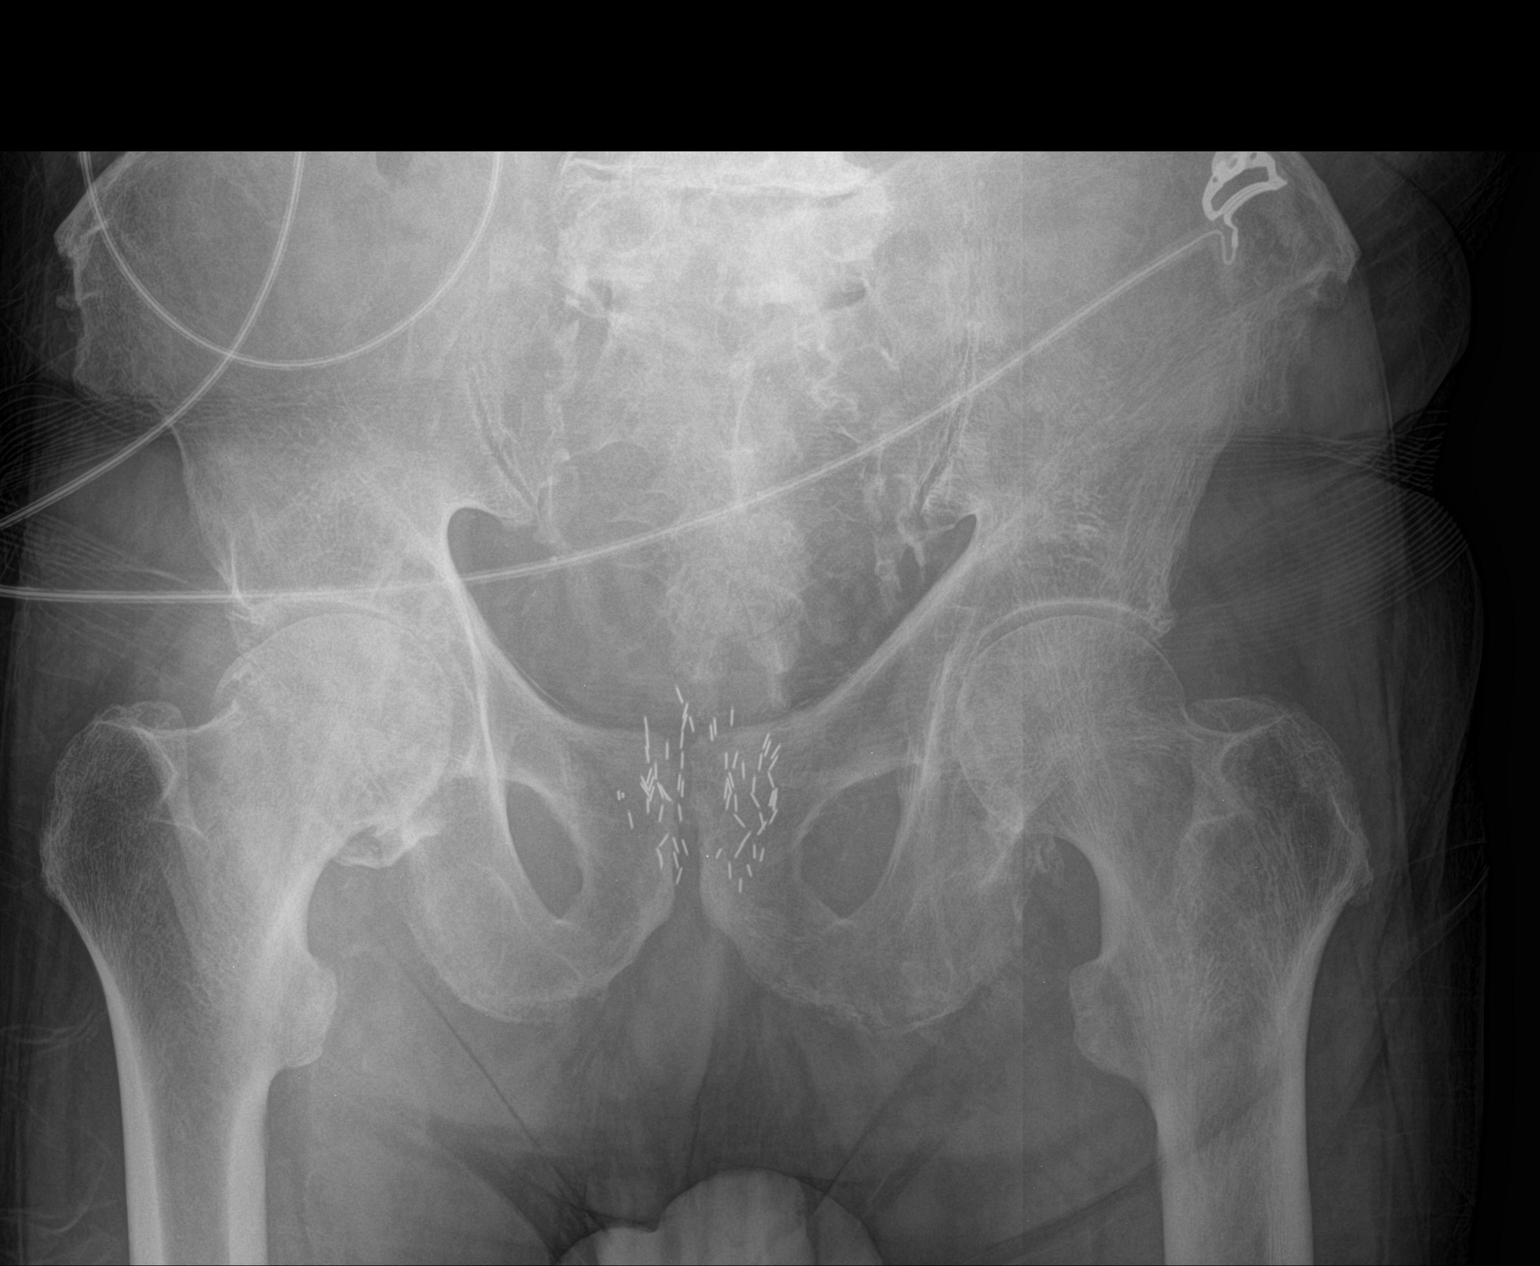

[1 of 1 positions shown; findings below may reference images not displayed]

FINDINGS: Brachytherapy seed implants at prostate gland.

SI joints preserved.

Bones demineralized.

Minimal narrowing of LEFT hip joint.

Significant narrowing of RIGHT hip joint with spur formation,
subchondral sclerosis, and bone-on-bone appearance.

No acute fracture, dislocation, or bone destruction.

Trabecular thickening at LEFT iliac bone extending into LEFT
ischium, question subtle pagetoid changes.

Degenerative changes at visualized lower lumbar spine.

Scattered atherosclerotic calcifications.
IMPRESSION: Degenerative changes of lower lumbar spine and BILATERAL hip joints
RIGHT greater than LEFT.

Osseous demineralization.

No definite acute osseous findings.

Questions Paget's disease changes of the LEFT pelvis.

## 2021-10-30 IMAGING — CT CT HEAD W/O CM
4 series · 16 of 47 positions shown, 18 images · non-contrast
Comparison: 02/17/2016

CLINICAL DATA: Head trauma. Fell from bed last night. Trauma to the
left parietal region.

EXAM:
CT HEAD WITHOUT CONTRAST
TECHNIQUE: Contiguous axial images were obtained from the base of the skull
through the vertex without intravenous contrast.

[Series 3: head without · axial · non-contrast · 0.48mm/px · z∈[-147,-37]mm · 7 of 30 slices shown, 9 images]
[im 4/30  brain]
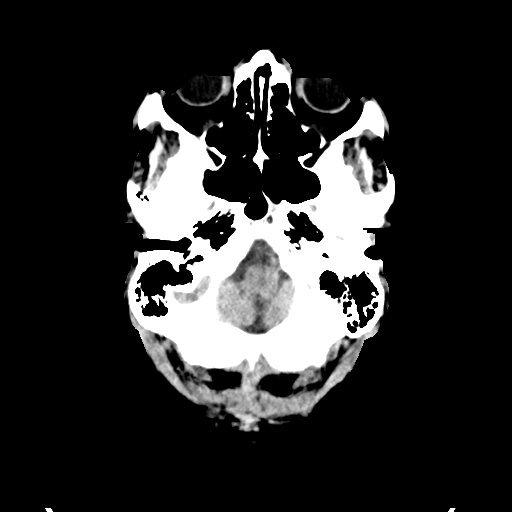
[im 4/30  bone]
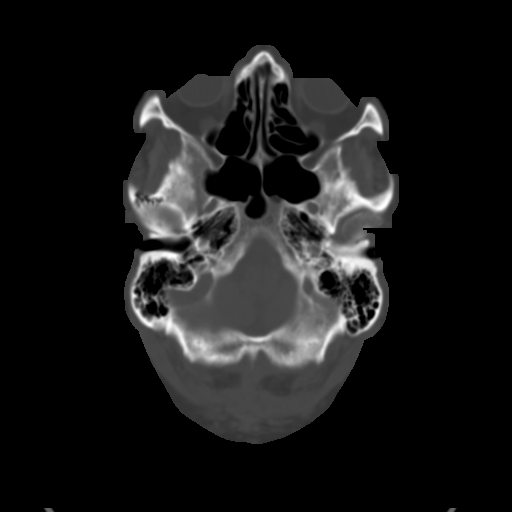
[im 8/30  brain]
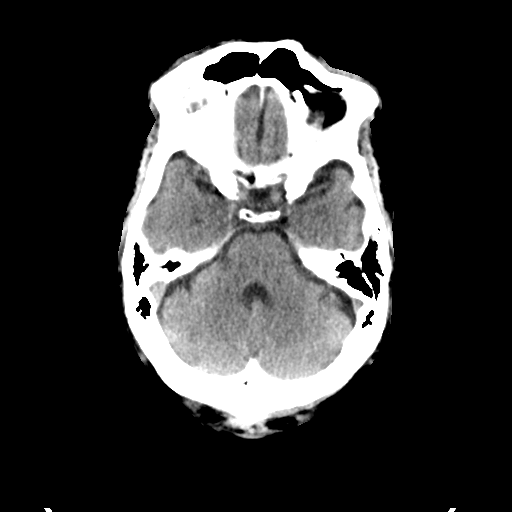
[im 11/30  brain]
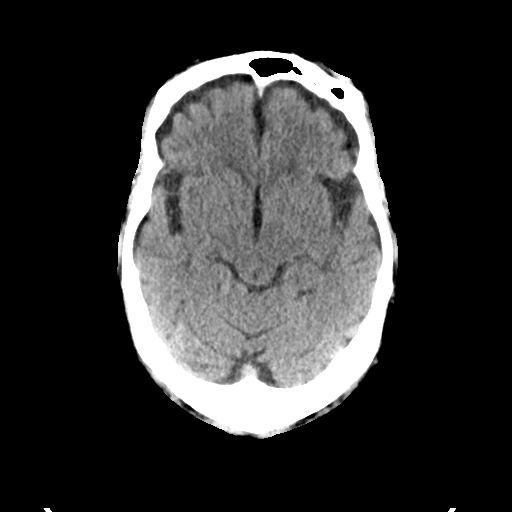
[im 15/30  brain]
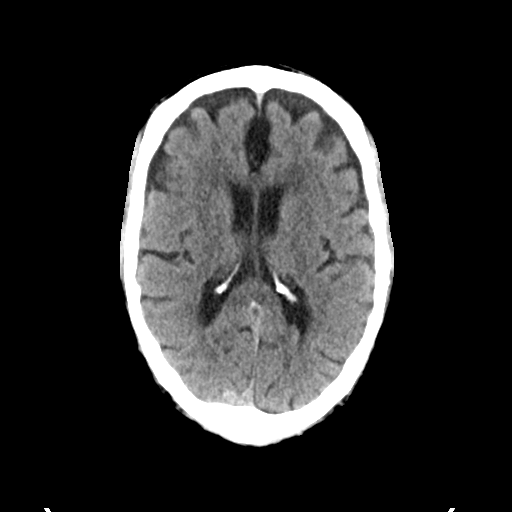
[im 19/30  brain]
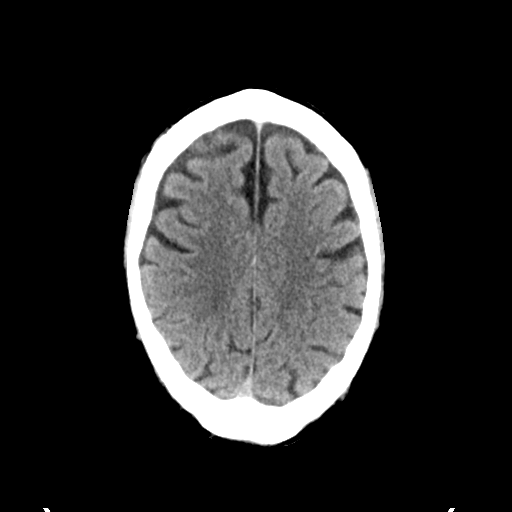
[im 19/30  bone]
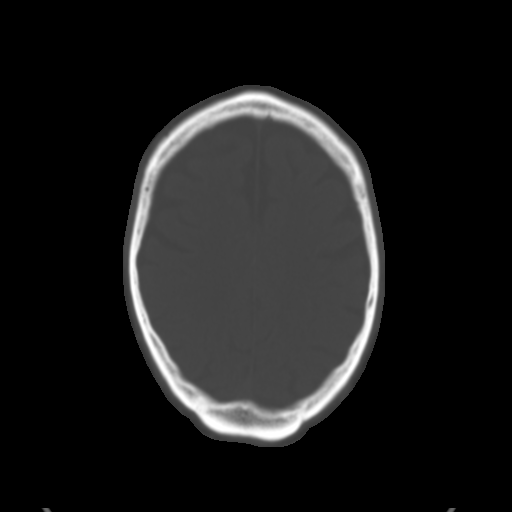
[im 22/30  brain]
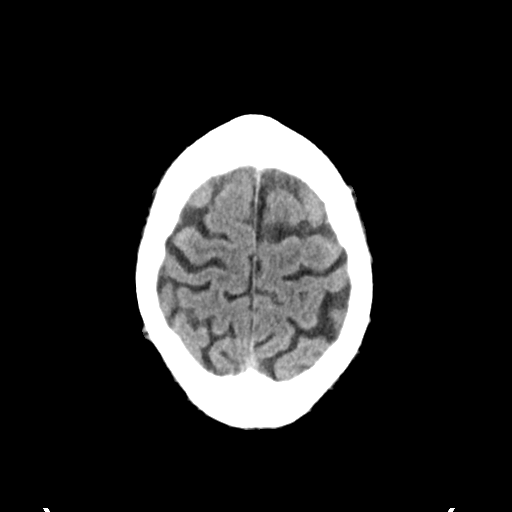
[im 26/30  brain]
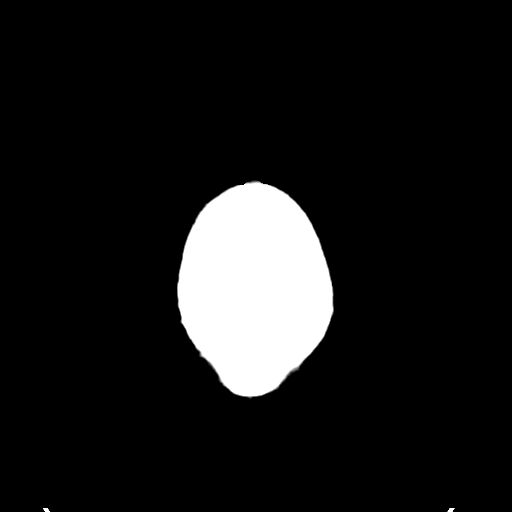

[Series 4: head bone · axial · 0.48mm/px · z∈[-148,-118]mm · 3 of 75 slices shown]
[im 8/75  bone]
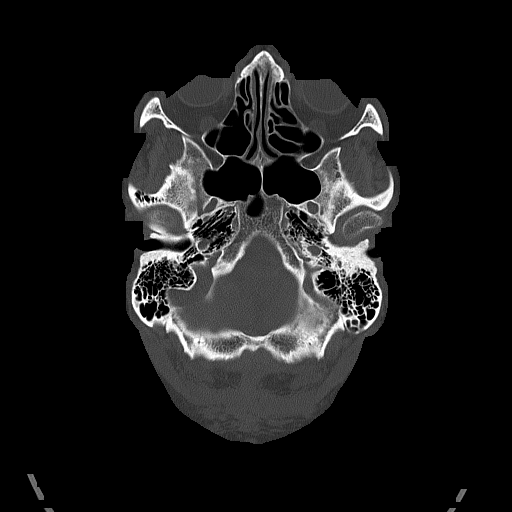
[im 15/75  bone]
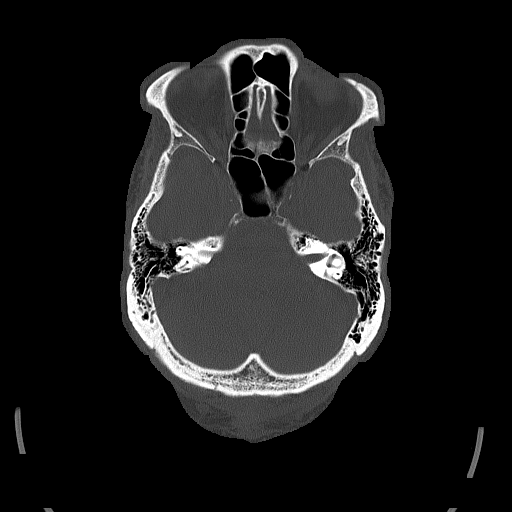
[im 23/75  bone]
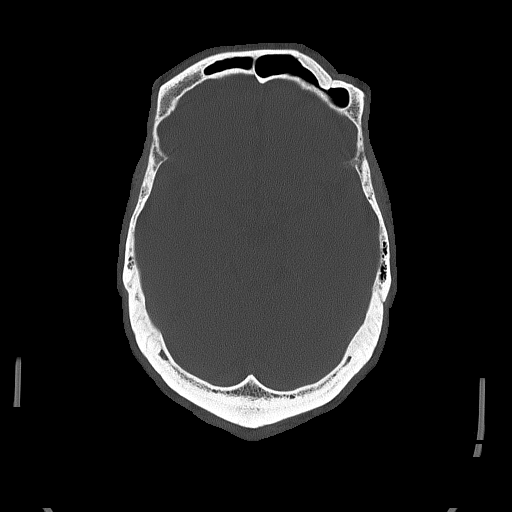

[Series 5: head without cor · coronal · non-contrast · 0.32mm/px · 3 of 76 slices shown]
[im 26/76  brain]
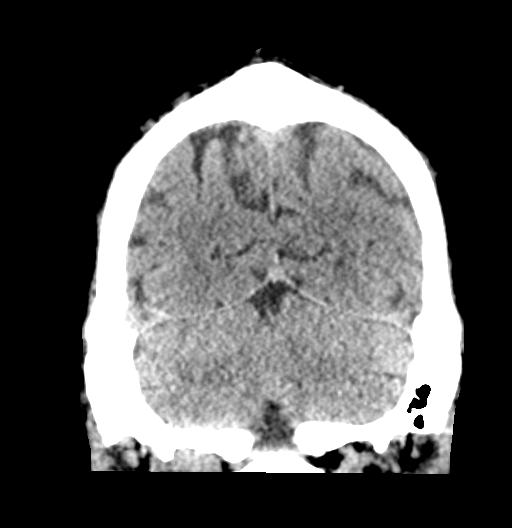
[im 34/76  brain]
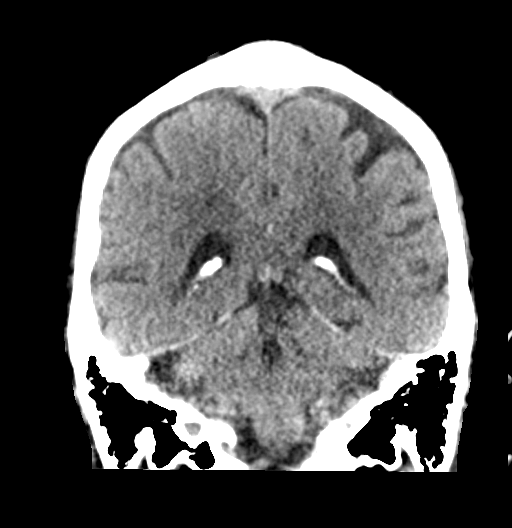
[im 42/76  brain]
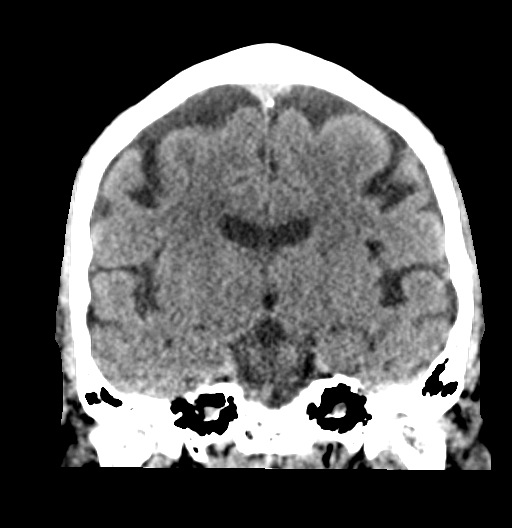

[Series 6: head without sag · sagittal · non-contrast · 0.32mm/px · 3 of 67 slices shown]
[im 23/67  brain]
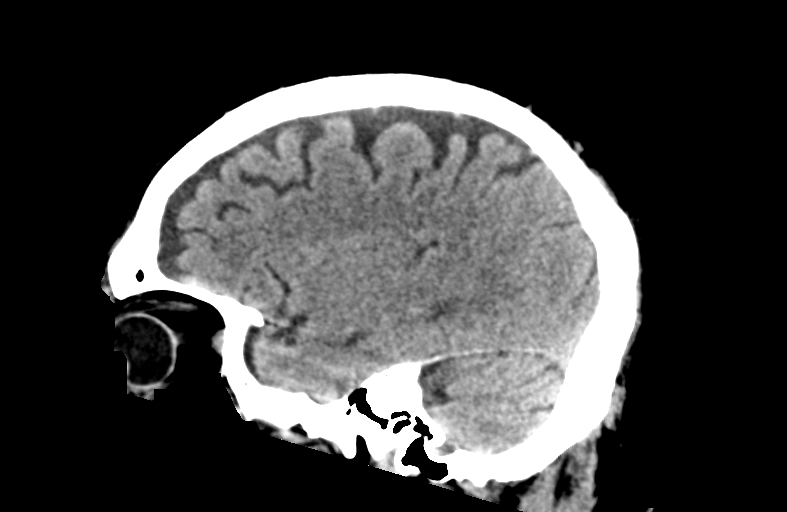
[im 34/67  brain]
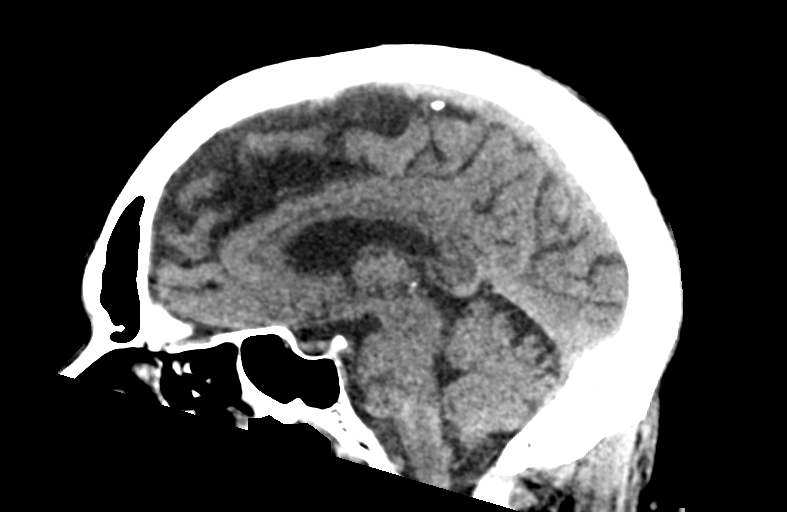
[im 45/67  brain]
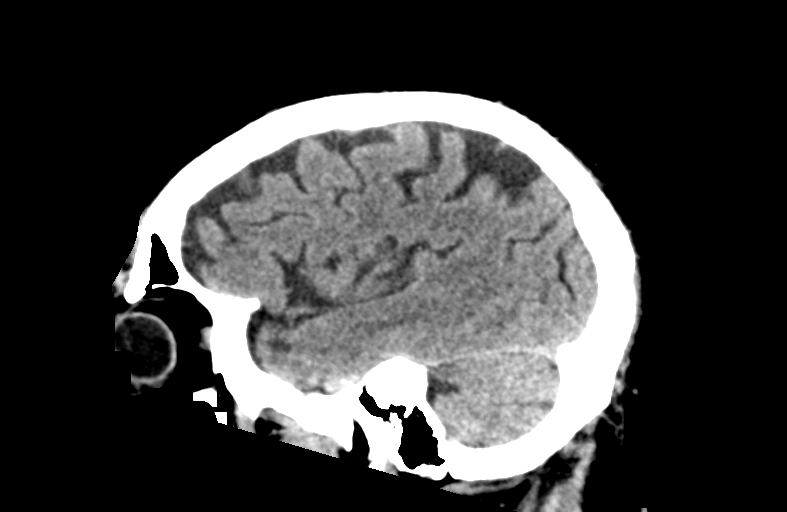

[16 of 47 positions shown; findings below may reference images not displayed]

FINDINGS: Brain: Mild age related volume loss. No evidence of acute
infarction, mass lesion, hemorrhage, hydrocephalus or extra-axial
collection. Minimal small vessel change of the deep white matter.

Vascular: There is atherosclerotic calcification of the major
vessels at the base of the brain.

Skull: No skull fracture.

Sinuses/Orbits: Clear/normal

Other: No visible soft tissue injury.
IMPRESSION: No acute or traumatic finding. Mild age related volume loss and
small-vessel change of the white matter.

## 2021-10-30 IMAGING — DX DG HUMERUS 2V *L*
2 series · 2 of 2 positions shown · non-contrast
Comparison: None

CLINICAL DATA: Fell from bed last night, LEFT shoulder and
posterior elbow pain, initial encounter

EXAM:
LEFT HUMERUS - 2+ VIEW

[x humerus ap left]
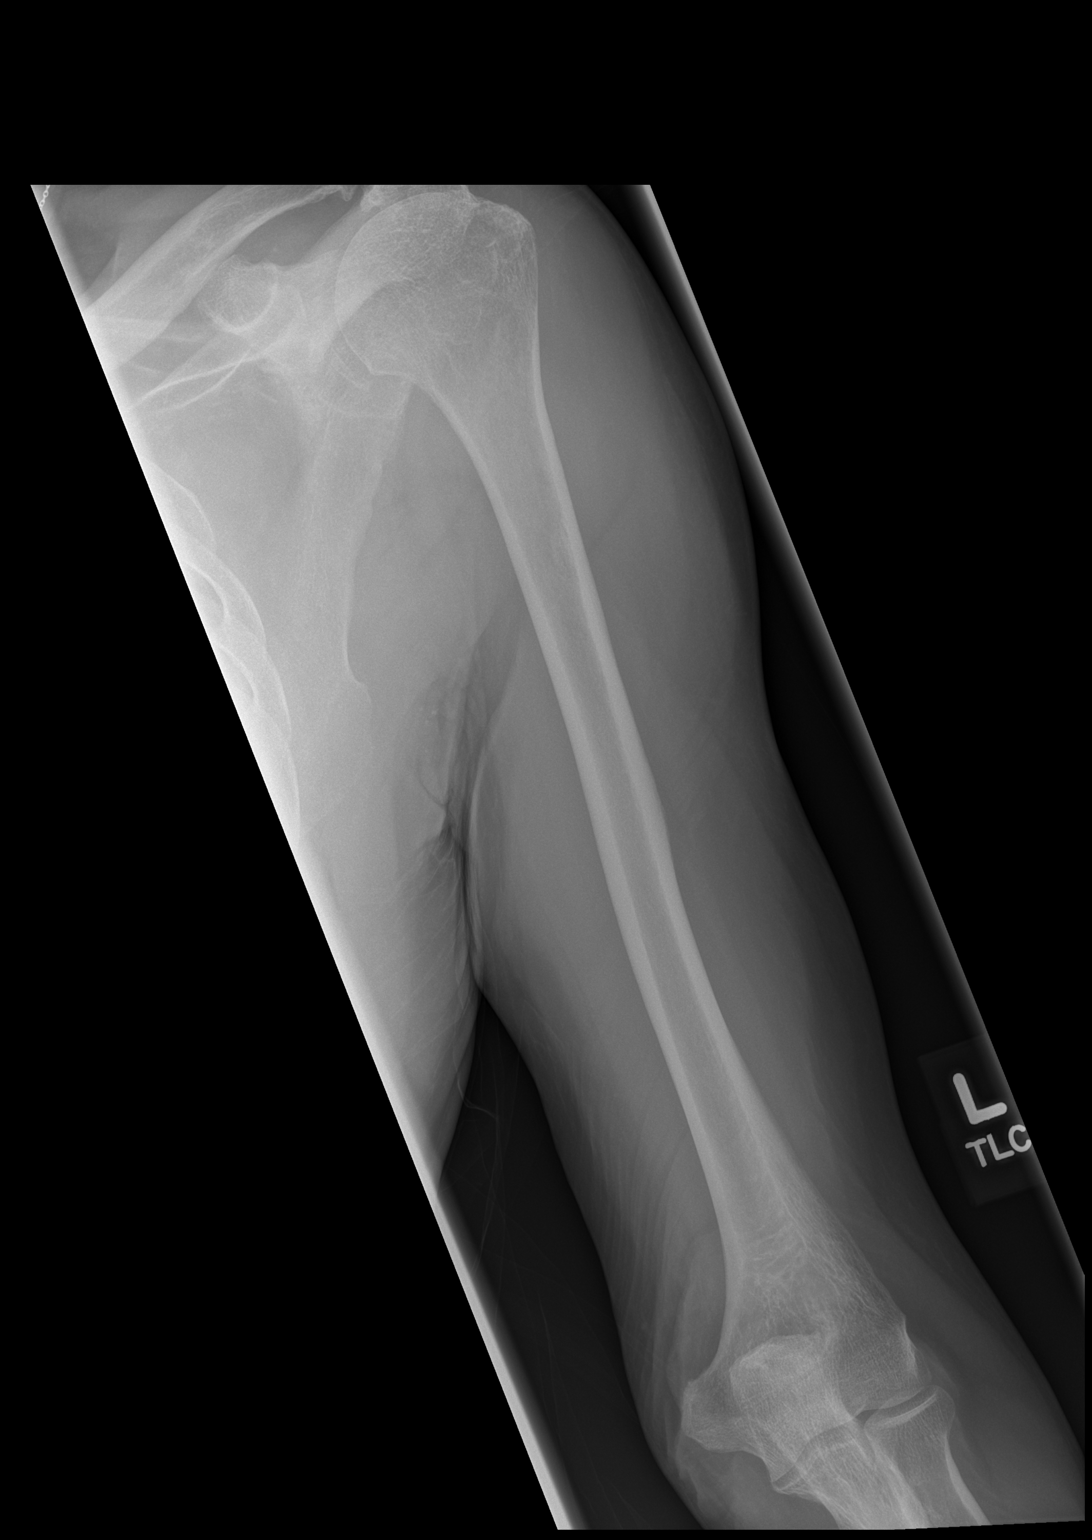

[x humerus lat left]
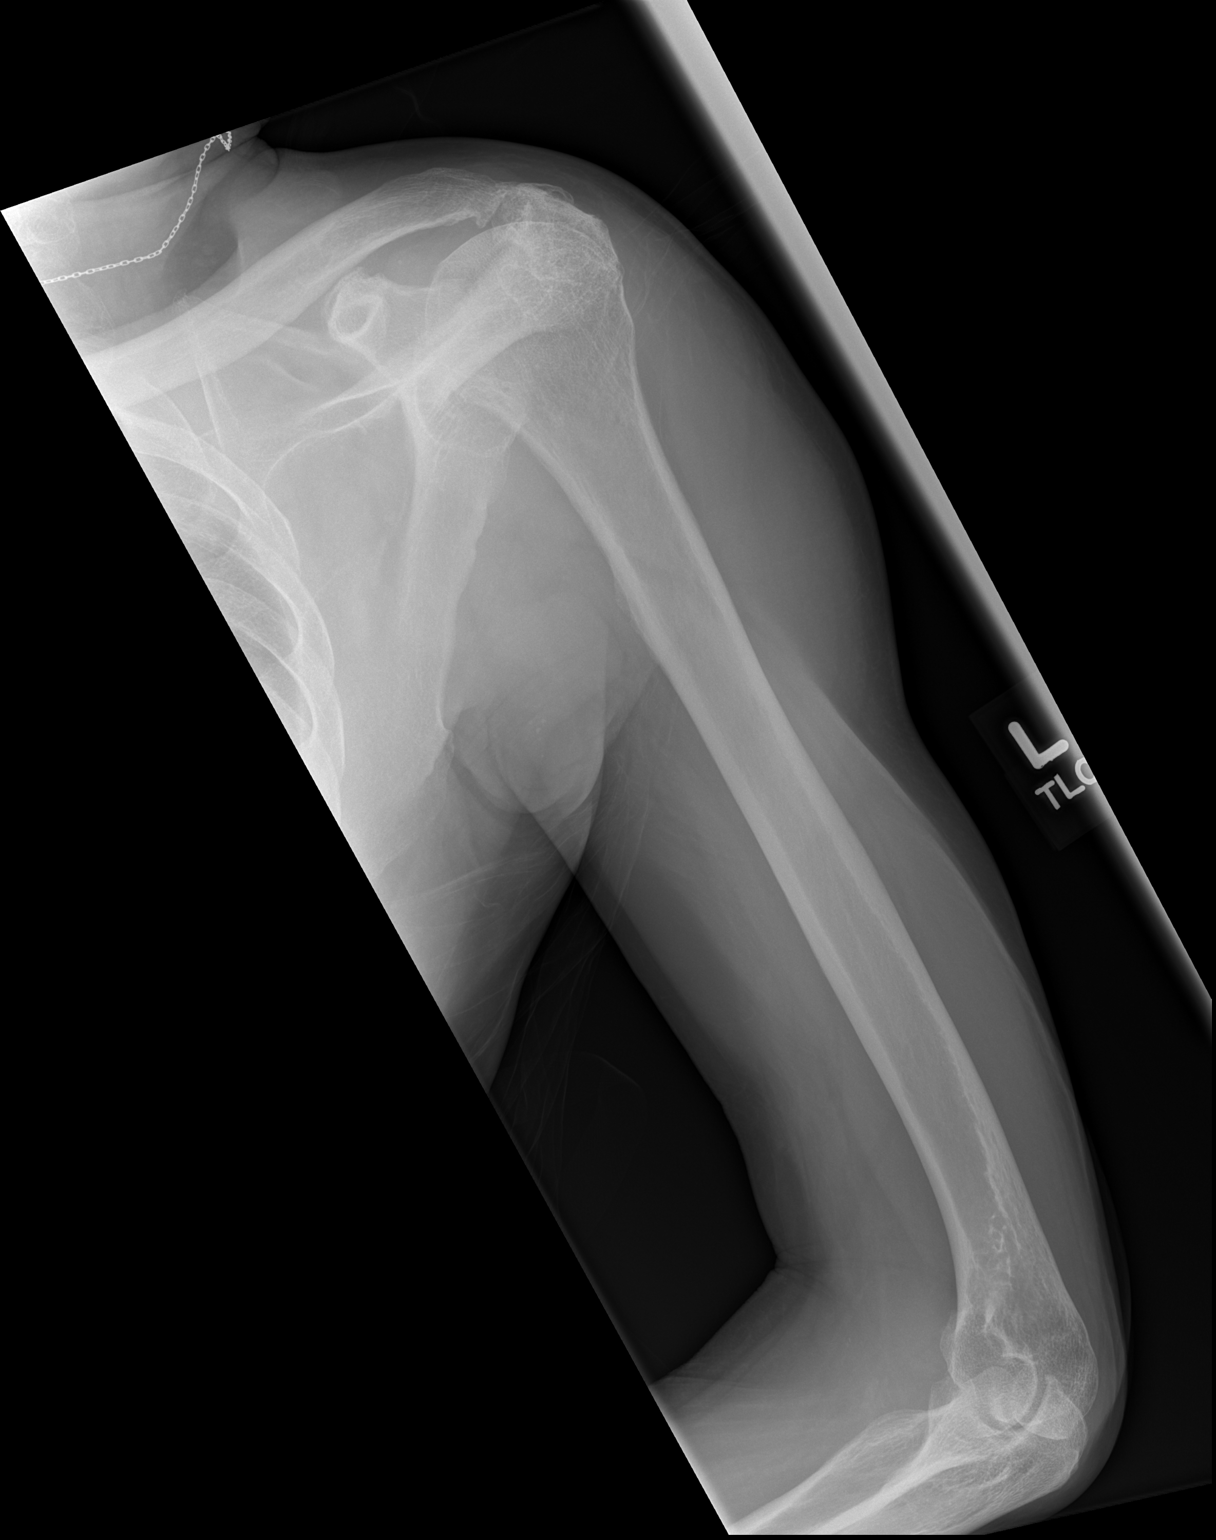

[2 of 2 positions shown; findings below may reference images not displayed]

FINDINGS: Degenerative changes LEFT AC joint.

Osseous demineralization.

Glenohumeral and elbow joint alignments grossly normal.

No acute fracture, dislocation, or bone destruction.

Approximation of humeral head with undersurface of acromion
suggesting chronic rotator cuff tear.
IMPRESSION: No acute osseous abnormalities.

Degenerative changes LEFT AC joint with question chronic LEFT
rotator cuff tear.

## 2021-10-30 IMAGING — DX DG RIBS W/ CHEST 3+V*R*
3 series · 3 of 3 positions shown · non-contrast
Comparison: 05/20/2020

CLINICAL DATA: Fell.  Right chest pain.

EXAM:
RIGHT RIBS AND CHEST - 3+ VIEW

[chest ap]
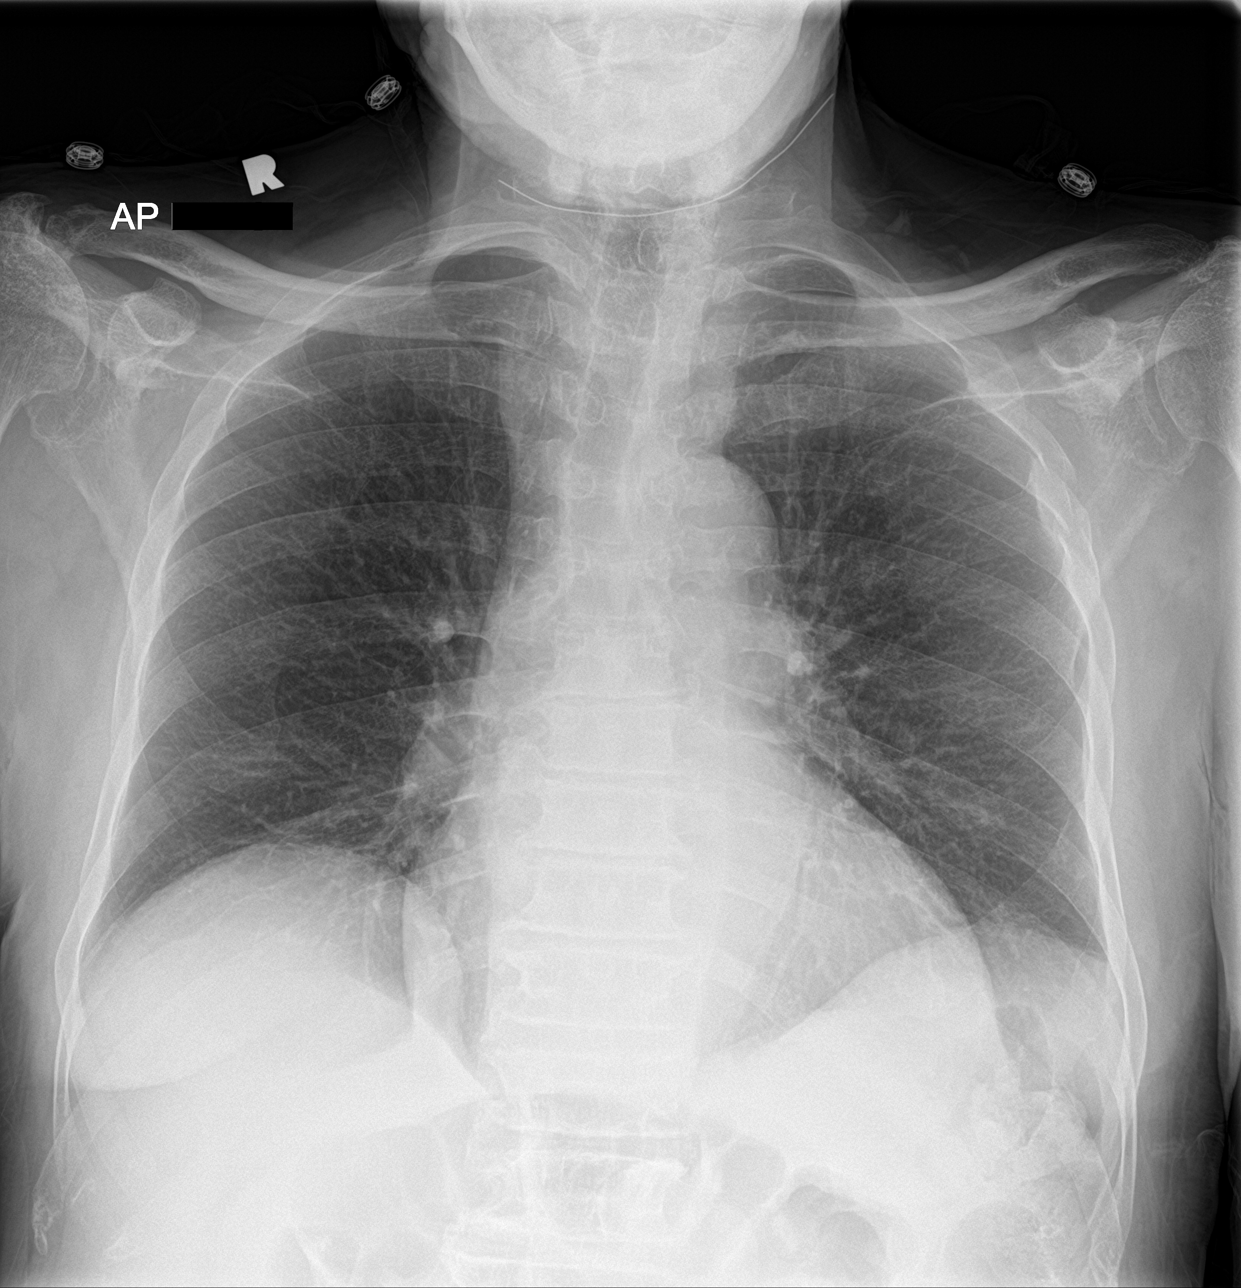

[rib ap]
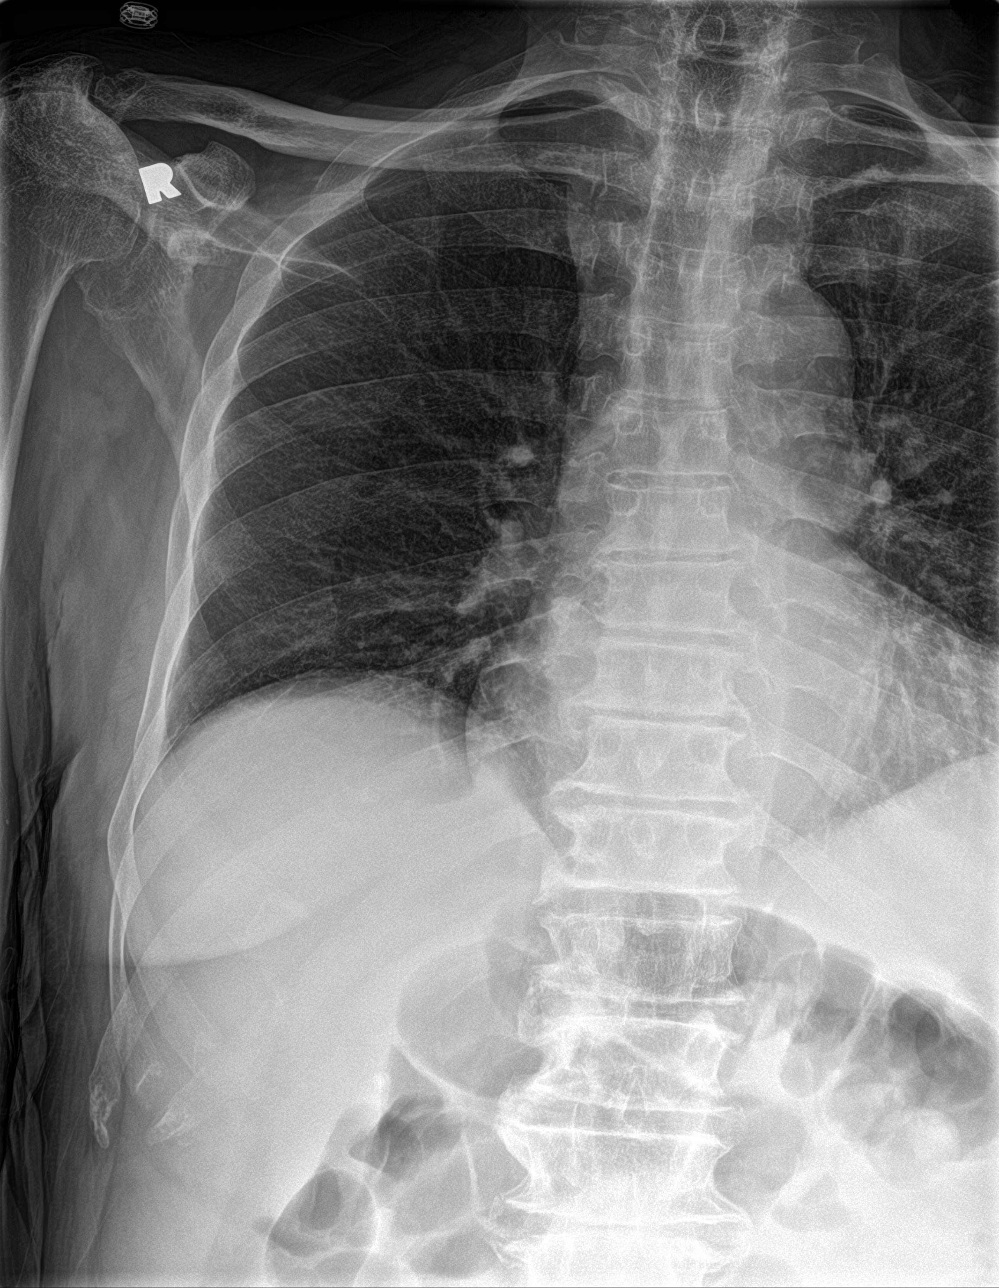

[rib ap obl]
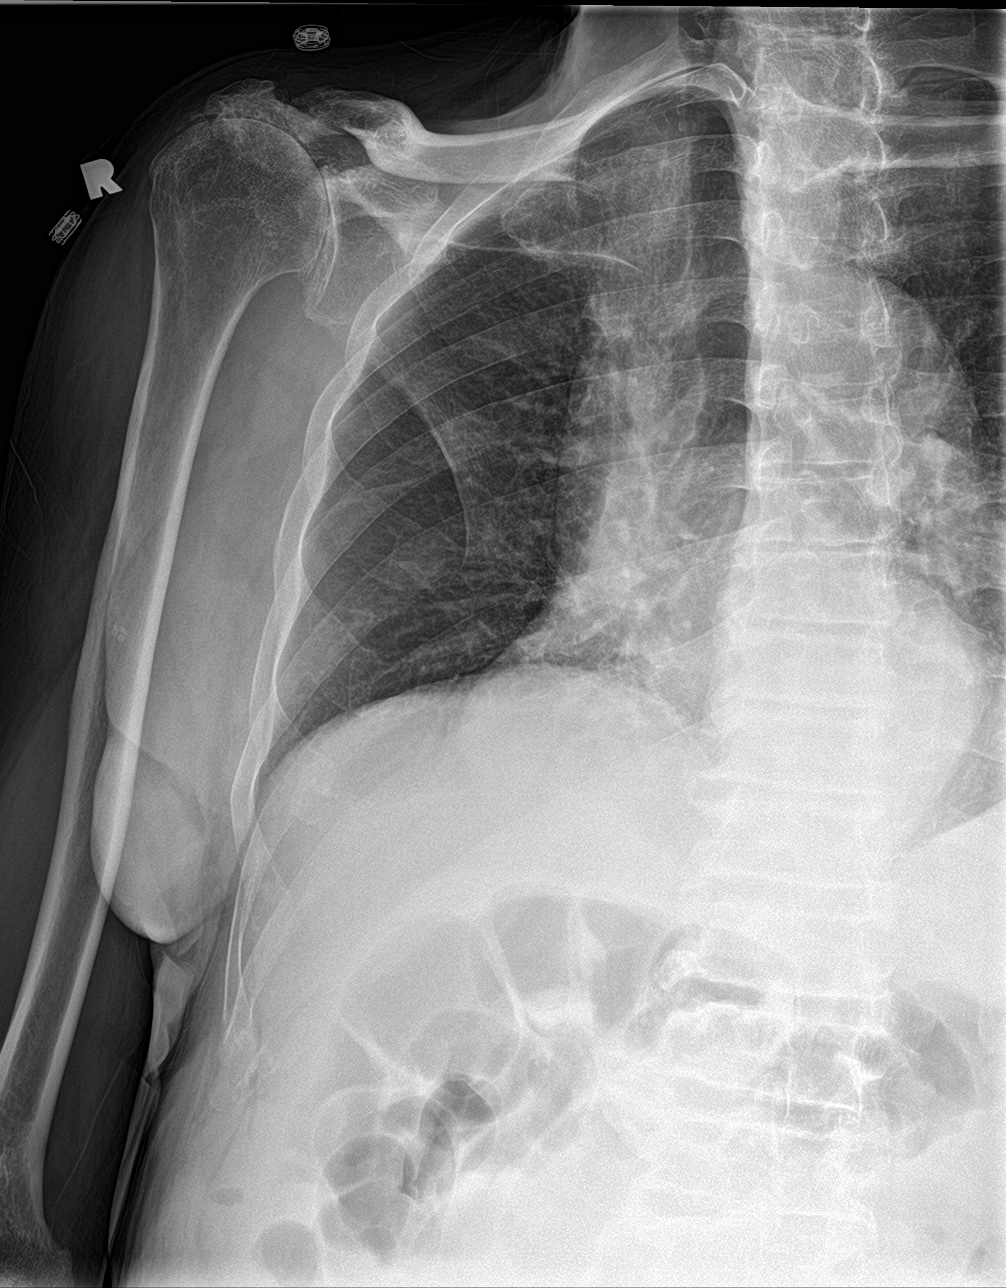

[3 of 3 positions shown; findings below may reference images not displayed]

FINDINGS: Heart size upper limits of normal. Atherosclerosis and tortuosity of
the aorta. The lungs are clear. No pneumothorax or hemothorax. Old
healed fractures of the left lateral third and fourth ribs. Right
rib films do not show any visible fracture. Chronic degenerative
changes affect the right shoulder as well as the left.
IMPRESSION: No active cardiopulmonary disease. Heart size upper limits of
normal. Aortic atherosclerosis. No visible rib fracture on the
right. Old healed fractures of the left third and fourth ribs.

## 2021-10-30 IMAGING — DX DG SHOULDER 2+V*L*
2 series · 2 of 2 positions shown · non-contrast
Comparison: None.

CLINICAL DATA: Fell.  Left shoulder pain.

EXAM:
LEFT SHOULDER - 2+ VIEW

[shoulder grashey]
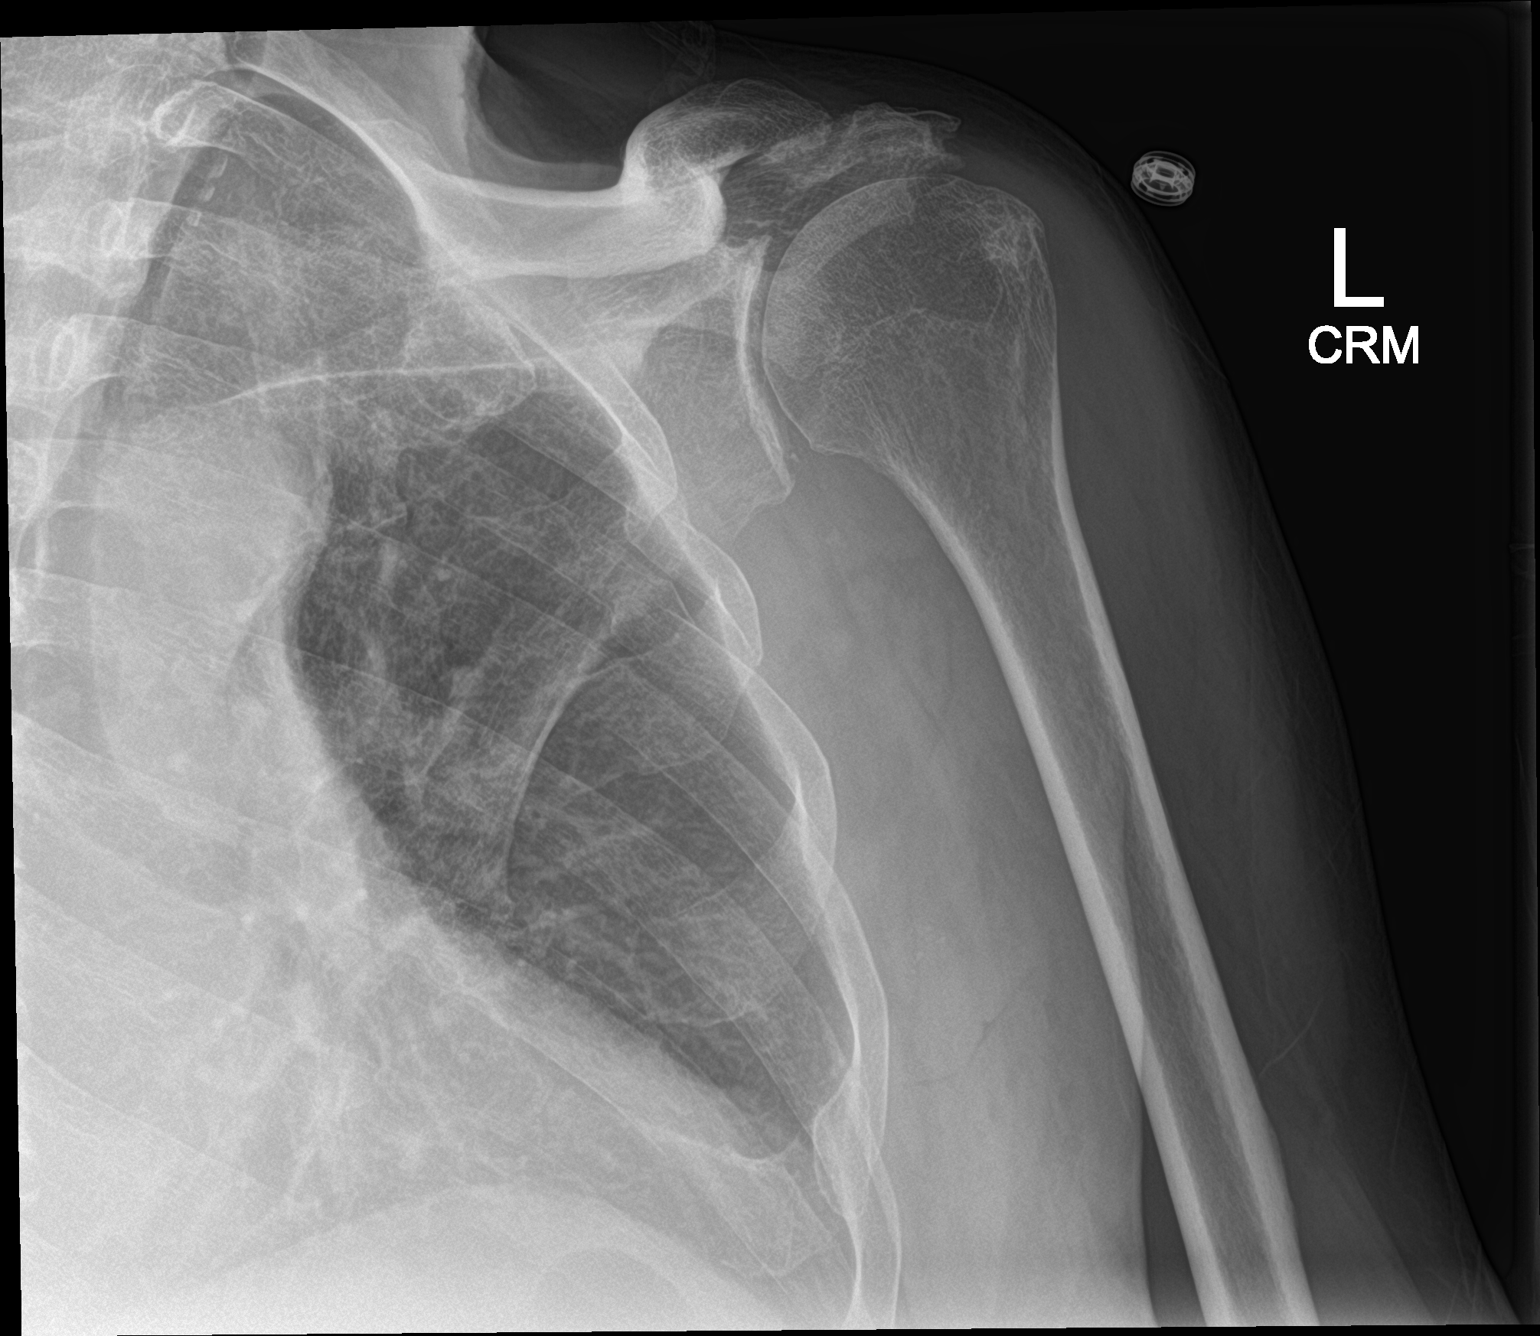

[shoulder y view]
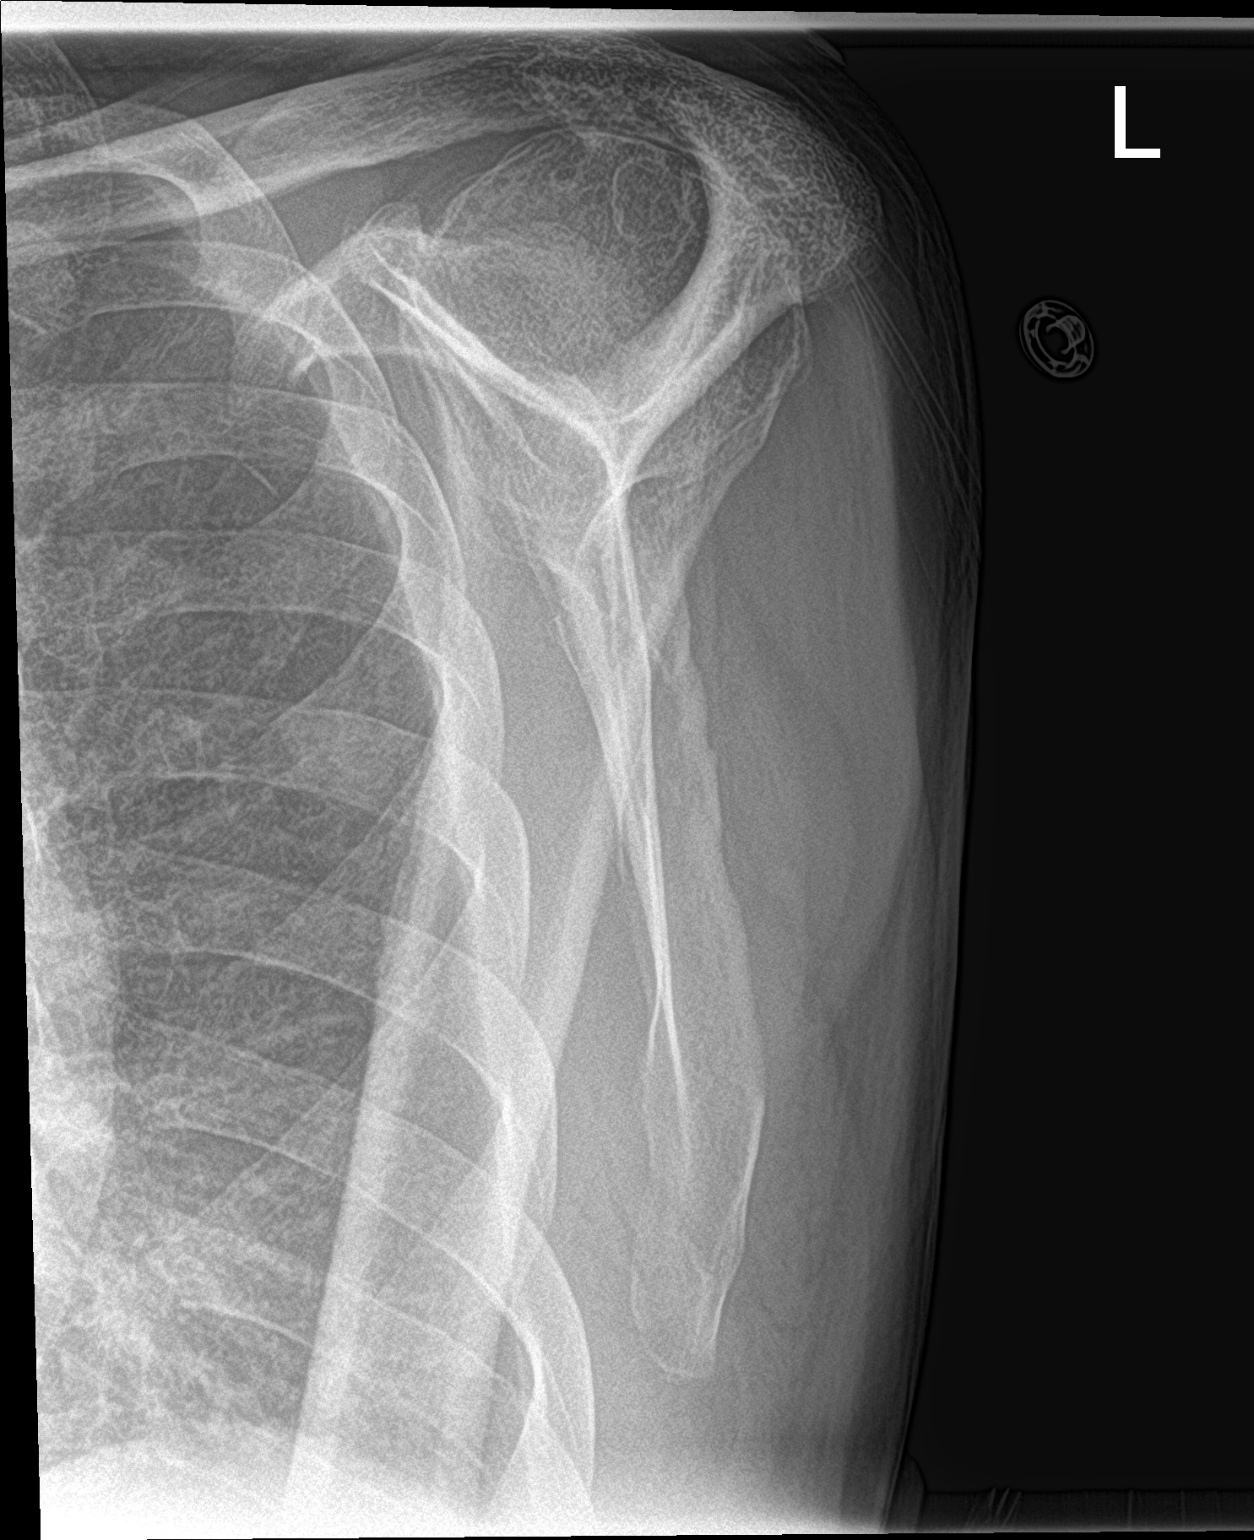

[2 of 2 positions shown; findings below may reference images not displayed]

FINDINGS: Glenohumeral joint is normal. Narrowed humeral acromial distance
suggesting the presence of rotator cuff disease. Chronic
degenerative change of the AC joint. Old healed fractures of the
left third and fourth ribs.
IMPRESSION: No acute traumatic finding. Probable chronic rotator cuff pathology.

## 2021-10-30 NOTE — Patient Outreach (Signed)
  Care Coordination   10/26/2021 Name: Joel Donaldson MRN: [id number] DOB: Jul 12, 1940   Care Coordination Outreach Attempts:  An unsuccessful telephone outreach was attempted today to offer the patient information about available care coordination services as a benefit of their health plan.   Follow Up Plan:  Additional outreach attempts will be made to offer the patient care coordination information and services.   Encounter Outcome:  No Answer  Care Coordination Interventions Activated:  No   Care Coordination Interventions:  No, not indicated    Lenor Derrick , MSW Social Worker [facility] Care Management  [phone]

## 2021-10-30 NOTE — Patient Outreach (Signed)
  Care Coordination   10/28/2021 Name: Joel Donaldson MRN: [id number] DOB: 18-Aug-1940   Care Coordination Outreach Attempts:  An unsuccessful telephone outreach was attempted today to offer the patient information about available care coordination services as a benefit of their health plan.   Follow Up Plan:  Additional outreach attempts will be made to offer the patient care coordination information and services.   Encounter Outcome:  No Answer  Care Coordination Interventions Activated:  No   Care Coordination Interventions:  No, not indicated    Lenor Derrick, MSW  Social Worker [facility]  [phone]

## 2021-10-30 NOTE — Patient Outreach (Signed)
  Care Coordination   10/30/2021 Name: LC JOYNT MRN: [id number] DOB: 1940/07/12   Care Coordination Outreach Attempts:  A third unsuccessful outreach was attempted today to offer the patient with information about available care coordination services as a benefit of their health plan.   Follow Up Plan:  Additional outreach attempts will be made to offer the patient care coordination information and services.   Encounter Outcome:  No Answer  Care Coordination Interventions Activated:  No   Care Coordination Interventions:  No, not indicated    Lenor Derrick, MSW  Social Worker [facility]  [phone]

## 2021-10-31 ENCOUNTER — Other Ambulatory Visit: Payer: Self-pay

## 2021-10-31 ENCOUNTER — Ambulatory Visit: Payer: Medicare Other | Attending: Family Medicine | Admitting: Physical Therapy

## 2021-10-31 ENCOUNTER — Encounter: Payer: Self-pay | Admitting: Physical Therapy

## 2021-10-31 DIAGNOSIS — R2689 Other abnormalities of gait and mobility: Secondary | ICD-10-CM | POA: Diagnosis not present

## 2021-10-31 DIAGNOSIS — W19XXXD Unspecified fall, subsequent encounter: Secondary | ICD-10-CM | POA: Diagnosis not present

## 2021-10-31 DIAGNOSIS — R296 Repeated falls: Secondary | ICD-10-CM | POA: Insufficient documentation

## 2021-10-31 DIAGNOSIS — M6281 Muscle weakness (generalized): Secondary | ICD-10-CM | POA: Insufficient documentation

## 2021-10-31 NOTE — Therapy (Signed)
OUTPATIENT PHYSICAL THERAPY LOWER EXTREMITY / Balance EVALUATION   Patient Name: Joel Donaldson MRN: [id number] DOB:09-19-1940, 81 y.o., male Today's Date: 10/31/2021   PT End of Session - 10/31/21 0919     Visit Number 1    Number of Visits 17    Date for PT Re-Evaluation 12/26/21    Authorization Type FOTO at 6th and 10th Visit,  KX mod at 15th visit    Progress Note Due on Visit 10    PT Start Time 0918    PT Stop Time 1015    PT Time Calculation (min) 57 min    Activity Tolerance Patient tolerated treatment well    Behavior During Therapy [facility] for tasks assessed/performed             Past Medical History:  Diagnosis Date   Alcohol abuse    Anxiety    Arthritis    hands, back, knees   Asthma    Depression    GERD (gastroesophageal reflux disease)    Glaucoma    Heart murmur asymptomatic   Hx of radioisotope therapy 07/26/11   Radiosactive Prostate Seed Implant/ I-125 Seeds   Hypertension    Neck pain 10/22/2019   Prostate cancer ([place]) dx'd 2016 or 2017   seed implants   Rheumatoid arthritis ([place]) 10/10/2012   Past Surgical History:  Procedure Laterality Date   CYSTOSCOPY  09/14/2011   Procedure: CYSTOSCOPY FLEXIBLE;  Surgeon: Hanley Ben, MD;  Location: [place];  Service: Urology;  Laterality: N/A;  No seeds seen in the bladder   LEFT HEART CATH AND CORONARY ANGIOGRAPHY N/A 09/16/2020   Procedure: LEFT HEART CATH AND CORONARY ANGIOGRAPHY;  Surgeon: Leonie Man, MD;  Location: [place] [facility];  Service: Cardiovascular;  Laterality: N/A;   [place] IMPLANT  09/14/2011   Procedure: RADIOACTIVE SEED IMPLANT;  Surgeon: Hanley Ben, MD;  Location: [place];  Service: Urology;  Laterality: N/A;  Total number of seeds -     SHOULDER SURGERY  YRS AGO   LEFT   Patient Active Problem List   Diagnosis Date Noted   Fall 10/23/2021   Acute nonintractable headache 05/31/2021   Chest pain  01/17/2021   Age-related cataract of left eye 01/10/2021   Cataract 01/10/2021   Abnormal cardiac CT angiography 09/16/2020   Epigastric pain 05/20/2020   Atherosclerosis of aorta ([place]) 05/20/2020   Tachycardia    Opioid dependence ([place]) 11/29/2017   Essential hypertension    Chest pain at rest 10/04/2016   Baker's cyst of knee, right 06/21/2016   Right leg swelling 06/14/2016   Back pain 09/19/2015   Unspecified vitamin D deficiency 07/07/2013   GERD (gastroesophageal reflux disease) 07/03/2013   Rheumatoid arthritis ([place]) 10/10/2012   Osteoarthritis of both shoulders 02/15/2012   Tendinopathy of rotator cuff 01/20/2012   Atypical angina (HCC) (Class III) 12/19/2011   Preventative health care 12/03/2011   Osteoarthritis of both knees 07/02/2011   Alcohol use 05/01/2010   INTENTION TREMOR 09/09/2008   Prostate cancer ([place]) 07/01/2008   Hyperlipidemia 06/24/2007   COPD (chronic obstructive pulmonary disease) ([place]) 05/12/2007   GASTROESOPHAGEAL REFLUX, NO ESOPHAGITIS 04/11/2006    PCP: Gerrit Heck, MD  REFERRING PROVIDER: Lind Covert, MD  REFERRING DIAG: Fall, subsequent encounter   THERAPY DIAG:  Repeated falls  Muscle weakness (generalized)  Other abnormalities of gait and mobility  Rationale for Evaluation and Treatment Rehabilitation  ONSET DATE: June 2023  SUBJECTIVE:   SUBJECTIVE STATEMENT: Pt has hx of 2-3 falls in the last 6 months as a result of his dog going in front of him and tripping. The last fall occurred on 9/2 and resulted in L wrist and R shoulder pain which he had imaged and were unremarkable. He reports the pain is getting better.   PERTINENT HISTORY: Hx of anxiety, depression, RA, Prostate Cx, Glaucoma  PAIN:  Are you having pain? Yes: NPRS scale: 6/10 Pain location: bil knees and shoulders Pain description: acing sore Aggravating factors: unsure, walking/ standing, stairs, lifting Relieving factors: pain medication,    PRECAUTIONS: Fall  WEIGHT BEARING RESTRICTIONS No  FALLS:  Has patient fallen in last 6 months? Yes. Number of falls 3  LIVING ENVIRONMENT: Lives with: lives with their family Lives in: House/apartment Stairs: No Has following equipment at home: Single point cane, Environmental consultant - 2 wheeled, and shower chair  OCCUPATION: Disability   PLOF: Independent with basic ADLs  PATIENT GOALS get back to where he was before this.   OBJECTIVE:   DIAGNOSTIC FINDINGS:  10/14/2021 CT Head WO Contrast IMPRESSION: 1. No acute traumatic injury identified. 2. Stable non contrast CT appearance of the brain with mild to moderate for age white matter changes.  10/14/2021 R shoulder X-ray IMPRESSION: 1. No fracture or acute finding. 2. Marked narrowing of the subacromial space consistent with a chronic full-thickness rotator cuff tear.  10/14/2021 L Wrist X-ray IMPRESSION: 1. No acute fracture or dislocation. 2. Widened scapholunate interval consistent with chronic scapholunate ligament disruption. 3. Degenerative changes as detailed. Probable old ulnar styloid fracture.  PATIENT SURVEYS:  FOTO 31% predicted 42%  COGNITION:  Overall cognitive status: Within functional limits for tasks assessed     SENSATION: WFL  POSTURE: rounded shoulders, forward head, and bil genu valgum  PALPATION: Gross knee pain (pt unable to pinpoint a specific area of pain). R>L   LOWER EXTREMITY ROM:  Active ROM Right eval Left eval  Hip flexion 4-/5P! 4/5 P!  Hip extension 3/5 3/5  Hip abduction 3-/5 3-/5  Hip adduction 3/5 3/5  Hip internal rotation    Hip external rotation    Knee flexion 3/5 P! 3/5 P!  Knee extension 3+/5 P! 4-/5 P!  Ankle dorsiflexion    Ankle plantarflexion    Ankle inversion    Ankle eversion     (Blank rows = not tested)  LOWER EXTREMITY MMT:  MMT Right eval Left eval  Hip flexion [facility]  Hip extension [facility]  Hip abduction    Hip adduction    Hip internal  rotation    Hip external rotation    Knee flexion [facility] WFL  Knee extension [facility]  Ankle dorsiflexion [facility] WFL  Ankle plantarflexion    Ankle inversion    Ankle eversion     (Blank rows = not tested)   FUNCTIONAL TESTS:  5 times sit to stand: 36 with the use of bil UE Berg Balance Scale: 29/56 BERG BALANCE TEST Sitting to Standing: 3.      Stands independently using hands Standing Unsupported: 3.      Stands 2 minutes with supervision Sitting Unsupported: 4.     Sits for 2 minutes independently Standing to Sitting: 2.     Uses back of legs against chair to control descent  Transfers: 3.     Transfers safely definite use of hands Standing with eyes closed: 2.     Able to stand for 3 seconds Standing with  feet together: 3.     Stands for 1 minute with supervision Reaching forward with outstretched arm: 2.     Reaches forward 2 inches Retrieving object from the floor: 3.     Able to pick up with supervision Turning to look behind: 1.     Needs supervision when turning Turning 360 degrees: 1.     Needs supervision or verbal cueing Place alternate foot on stool: 0.     Unable, needs assist to keep from falling Standing with one foot in front: 1.     Needs help to step, but can hold for 15 seconds Standing on one foot: 1.     Holds <3 seconds  Total Score: 29/56   GAIT: Distance walked: 125 ft Assistive device utilized: Single point cane Level of assistance: Modified independence Comments: bil genu valgum, decreased step length/ stride bil    TODAY'S TREATMENT: [facility] Adult PT Treatment:                                                DATE: EVAL Therapeutic Exercise: LAQ w quad set 1 x 5 holding 2 sec ea Seated marching 2 x 10 (verbal cues to avoid leaning backward) Glute set 1 x 10 holding 5 sec Sit to and 1 x 5  Therapeutic Activity: To assist with sit to stand, scooting to edge of seat, bending the knees, and rocking forward to assist with standing.    PATIENT  EDUCATION:  Education details: Evaluation findings, POC, goals, HEP with proper form/ rationale. Discussed findings of balance assessment and to bring in his RW to maximize his safety. Person educated: Patient Education method: Explanation, Verbal cues, and Handouts Education comprehension: verbalized understanding   HOME EXERCISE PROGRAM: Access Code: [email or web address] URL: [email or web address] Date: 10/31/2021 Prepared by: [place]  Exercises - Seated Gluteal Sets  - 1 x daily - 7 x weekly - 3 sets - 10 reps - 5 seconds hold - Seated Long Arc Quad  - 1 x daily - 7 x weekly - 3 sets - 10 reps - 2 seconds hold - Seated March  - 1 x daily - 7 x weekly - 3 sets - 10 reps - Seated Hip Abduction with Resistance  - 1 x daily - 7 x weekly - 2 sets - 10 reps - Seated Heel Toe Raises  - 1 x daily - 7 x weekly - 3 sets - 10 reps - Sit to Stand  - 1 x daily - 7 x weekly - 2 sets - 10 reps  ASSESSMENT:  CLINICAL IMPRESSION: Patient is a 81 y.o. M who was seen today for physical therapy evaluation and treatment for hx of falling with the most recent occurring in early September and report of bil knee pain. He reports bil knee pain but is unable to specify a location but notes the R is worse than the L. He has functional LE ROM but MMT revealed gross LE weakness. He scored a 29/56 on the berg indicating a high fall risk. He would benefit from physical therapy to decrease bil knee pain, increase bil LE strength, promote gait    OBJECTIVE IMPAIRMENTS decreased activity tolerance, decreased balance, decreased endurance, decreased strength, improper body mechanics, postural dysfunction, and pain.   ACTIVITY LIMITATIONS carrying, lifting, bending, sitting, standing, squatting, and stairs  PARTICIPATION LIMITATIONS: cleaning, laundry, shopping, and  community activity  PERSONAL FACTORS Age and 3+ comorbidities: Falls, anxiety, depression, hx of Cx  are also affecting patient's functional  outcome.   REHAB POTENTIAL: Good  CLINICAL DECISION MAKING: Evolving/moderate complexity  EVALUATION COMPLEXITY: Moderate   GOALS: Goals reviewed with patient? Yes  SHORT TERM GOALS: Target date: 11/28/2021  Pt to be IND with initial HEP for therapeutic progression Baseline: Goal status: INITIAL  2.  Increase LE strength by >/= 1 MMT grade to promote strengthening Baseline:  Goal status: INITIAL  3.  Improve Berg balance score by >/= 6 points to promote balance and stability  Baseline:  Goal status: INITIAL   LONG TERM GOALS: Target date: 12/26/2021   Increase bil LE strength to >/= 4/5 to promote strength to maximize stability Baseline:  Goal status: INITIAL  2.  Pt to be able to walk/ stand for >/= 45 min and navigate up/ down >/= 12 steps with LRAD with </= 5/10 pain with no report of buckling or report of instability  Baseline:  Goal status: INITIAL  3.  Pt to increase Berg balance to >/= 45 to demo improvement in balance and safety and reduce falls Baseline:  Goal status: INITIAL  4.  Improve FOTO to >/= 42 to demo improvement in function Baseline:  Goal status: INITIAL  5. Pt to repot no falls in the last 4 weeks and </=min report of instability and falling  Baseline:  Goal status: INITIAL  6.  Pt to be IND with all HEP and will be able to maintain and progress current LOF IND Baseline:  Goal status: INITIAL    PLAN: PT FREQUENCY: 1-2x/week  PT DURATION: 8 weeks  PLANNED INTERVENTIONS: Therapeutic exercises, Therapeutic activity, Neuromuscular re-education, Balance training, Gait training, Patient/Family education, Self Care, Joint mobilization, Stair training, Cryotherapy, Moist heat, Manual therapy, and Re-evaluation  PLAN FOR NEXT SESSION: Review and update HEP PRN. Gross bil LE strengthening, sit to stand biomechanics, seated balance on unstable surface   Teja Costen PT, DPT, LAT, ATC  10/31/21  11:25 AM

## 2021-11-01 DIAGNOSIS — Z79899 Other long term (current) drug therapy: Secondary | ICD-10-CM | POA: Diagnosis not present

## 2021-11-02 ENCOUNTER — Ambulatory Visit: Payer: Self-pay | Admitting: Licensed Clinical Social Worker

## 2021-11-02 NOTE — Patient Outreach (Signed)
  Care Coordination   11/02/2021 Name: LAITH ANTONELLI MRN: [id number] DOB: 04-08-1940   Care Coordination Outreach Attempts:  A third unsuccessful outreach was attempted today to offer the patient with information about available care coordination services as a benefit of their health plan.   Follow Up Plan:  Additional outreach attempts will be made to offer the patient care coordination information and services.   Encounter Outcome:  No Answer  Care Coordination Interventions Activated:  No   Care Coordination Interventions:  No, not indicated    Lenor Derrick, MSW  Social Worker [facility]  [phone]

## 2021-11-02 NOTE — Patient Outreach (Signed)
  Care Coordination   Initial Visit Note   11/02/2021 Name: Joel Donaldson MRN: [id number] DOB: 1940-10-28  Joel Donaldson is a 81 y.o. year old male who sees Joel Heck, MD for primary care. I spoke with  Joel Donaldson by phone today.  What matters to the patients health and wellness today?  Home health additional hours.   Goals Addressed               This Visit's Progress     Care Coordination Activities (pt-stated)        Patient requesting additional hours for In home  services with [facility]. SW will send request to PCP.  SW completed SDOH screening. Patient has no additional needs.         SDOH assessments and interventions completed:  Yes     Care Coordination Interventions Activated:  Yes  Care Coordination Interventions:  Yes, provided   Follow up plan: No further intervention required.   Encounter Outcome:  Pt. Visit Completed   Joel Donaldson, MSW  Social Worker [facility]  [phone]

## 2021-11-03 ENCOUNTER — Ambulatory Visit: Payer: Medicare Other | Admitting: Physical Therapy

## 2021-11-07 ENCOUNTER — Encounter: Payer: Self-pay | Admitting: Physical Therapy

## 2021-11-07 ENCOUNTER — Ambulatory Visit: Payer: Medicare Other | Admitting: Physical Therapy

## 2021-11-07 DIAGNOSIS — R296 Repeated falls: Secondary | ICD-10-CM

## 2021-11-07 DIAGNOSIS — M6281 Muscle weakness (generalized): Secondary | ICD-10-CM | POA: Diagnosis not present

## 2021-11-07 DIAGNOSIS — R2689 Other abnormalities of gait and mobility: Secondary | ICD-10-CM

## 2021-11-07 NOTE — Therapy (Signed)
OUTPATIENT PHYSICAL THERAPY LOWER EXTREMITY / BALANCE   Patient Name: Joel Donaldson MRN: [id number] DOB:1940-06-11, 81 y.o., male Today's Date: 11/07/2021   PT End of Session - 11/07/21 0857     Visit Number 2    Number of Visits 17    Date for PT Re-Evaluation 12/26/21    Authorization Type FOTO at 6th and 10th Visit,  KX mod at 15th visit    Progress Note Due on Visit 10    PT Start Time 0857    PT Stop Time 0943    PT Time Calculation (min) 46 min    Activity Tolerance Patient tolerated treatment well    Behavior During Therapy [facility] for tasks assessed/performed              Past Medical History:  Diagnosis Date   Alcohol abuse    Anxiety    Arthritis    hands, back, knees   Asthma    Depression    GERD (gastroesophageal reflux disease)    Glaucoma    Heart murmur asymptomatic   Hx of radioisotope therapy 07/26/11   Radiosactive Prostate Seed Implant/ I-125 Seeds   Hypertension    Neck pain 10/22/2019   Prostate cancer ([place]) dx'd 2016 or 2017   seed implants   Rheumatoid arthritis ([place]) 10/10/2012   Past Surgical History:  Procedure Laterality Date   CYSTOSCOPY  09/14/2011   Procedure: CYSTOSCOPY FLEXIBLE;  Surgeon: Hanley Ben, MD;  Location: [place];  Service: Urology;  Laterality: N/A;  No seeds seen in the bladder   LEFT HEART CATH AND CORONARY ANGIOGRAPHY N/A 09/16/2020   Procedure: LEFT HEART CATH AND CORONARY ANGIOGRAPHY;  Surgeon: Leonie Man, MD;  Location: [place] [facility];  Service: Cardiovascular;  Laterality: N/A;   Trevose IMPLANT  09/14/2011   Procedure: RADIOACTIVE SEED IMPLANT;  Surgeon: Hanley Ben, MD;  Location: [place];  Service: Urology;  Laterality: N/A;  Total number of seeds -     SHOULDER SURGERY  YRS AGO   LEFT   Patient Active Problem List   Diagnosis Date Noted   Fall 10/23/2021   Acute nonintractable headache 05/31/2021   Chest pain 01/17/2021    Age-related cataract of left eye 01/10/2021   Cataract 01/10/2021   Abnormal cardiac CT angiography 09/16/2020   Epigastric pain 05/20/2020   Atherosclerosis of aorta ([place]) 05/20/2020   Tachycardia    Opioid dependence ([place]) 11/29/2017   Essential hypertension    Chest pain at rest 10/04/2016   Baker's cyst of knee, right 06/21/2016   Right leg swelling 06/14/2016   Back pain 09/19/2015   Unspecified vitamin D deficiency 07/07/2013   GERD (gastroesophageal reflux disease) 07/03/2013   Rheumatoid arthritis ([place]) 10/10/2012   Osteoarthritis of both shoulders 02/15/2012   Tendinopathy of rotator cuff 01/20/2012   Atypical angina ([facility]) (Class III) 12/19/2011   Preventative health care 12/03/2011   Osteoarthritis of both knees 07/02/2011   Alcohol use 05/01/2010   INTENTION TREMOR 09/09/2008   Prostate cancer ([place]) 07/01/2008   Hyperlipidemia 06/24/2007   COPD (chronic obstructive pulmonary disease) ([place]) 05/12/2007   GASTROESOPHAGEAL REFLUX, NO ESOPHAGITIS 04/11/2006    PCP: Gerrit Heck, MD  REFERRING PROVIDER: Lind Covert, MD  REFERRING DIAG: Fall, subsequent encounter   THERAPY DIAG:  Repeated falls  Muscle weakness (generalized)  Other abnormalities of gait and mobility  Rationale for Evaluation and Treatment Rehabilitation  ONSET DATE: June 2023  SUBJECTIVE:   SUBJECTIVE STATEMENT: "I feel things are going fair."  PERTINENT HISTORY: Hx of anxiety, depression, RA, Prostate Cx, Glaucoma  PAIN:  Are you having pain? Yes: NPRS scale: 5/10 Pain location: bil knees and shoulders Pain description: acing sore Aggravating factors: unsure, walking/ standing, stairs, lifting Relieving factors: pain medication,   PRECAUTIONS: Fall  WEIGHT BEARING RESTRICTIONS No  FALLS:  Has patient fallen in last 6 months? Yes. Number of falls 3  LIVING ENVIRONMENT: Lives with: lives with their family Lives in: House/apartment Stairs: No Has following  equipment at home: Single point cane, Environmental consultant - 2 wheeled, and shower chair  OCCUPATION: Disability   PLOF: Independent with basic ADLs  PATIENT GOALS get back to where he was before this.   OBJECTIVE:   DIAGNOSTIC FINDINGS:  10/14/2021 CT Head WO Contrast IMPRESSION: 1. No acute traumatic injury identified. 2. Stable non contrast CT appearance of the brain with mild to moderate for age white matter changes.  10/14/2021 R shoulder X-ray IMPRESSION: 1. No fracture or acute finding. 2. Marked narrowing of the subacromial space consistent with a chronic full-thickness rotator cuff tear.  10/14/2021 L Wrist X-ray IMPRESSION: 1. No acute fracture or dislocation. 2. Widened scapholunate interval consistent with chronic scapholunate ligament disruption. 3. Degenerative changes as detailed. Probable old ulnar styloid fracture.  PATIENT SURVEYS:  FOTO 31% predicted 42%  COGNITION:  Overall cognitive status: Within functional limits for tasks assessed     SENSATION: WFL  POSTURE: rounded shoulders, forward head, and bil genu valgum  PALPATION: Gross knee pain (pt unable to pinpoint a specific area of pain). R>L   LOWER EXTREMITY ROM:  Active ROM Right eval Left eval  Hip flexion 4-/5P! 4/5 P!  Hip extension 3/5 3/5  Hip abduction 3-/5 3-/5  Hip adduction 3/5 3/5  Hip internal rotation    Hip external rotation    Knee flexion 3/5 P! 3/5 P!  Knee extension 3+/5 P! 4-/5 P!  Ankle dorsiflexion    Ankle plantarflexion    Ankle inversion    Ankle eversion     (Blank rows = not tested)  LOWER EXTREMITY MMT:  MMT Right eval Left eval  Hip flexion [facility]  Hip extension [facility]  Hip abduction    Hip adduction    Hip internal rotation    Hip external rotation    Knee flexion [facility] WFL  Knee extension [facility]  Ankle dorsiflexion [facility] WFL  Ankle plantarflexion    Ankle inversion    Ankle eversion     (Blank rows = not tested)   FUNCTIONAL TESTS:  5 times sit to  stand: 36 with the use of bil UE Berg Balance Scale: 29/56 BERG BALANCE TEST Sitting to Standing: 3.      Stands independently using hands Standing Unsupported: 3.      Stands 2 minutes with supervision Sitting Unsupported: 4.     Sits for 2 minutes independently Standing to Sitting: 2.     Uses back of legs against chair to control descent  Transfers: 3.     Transfers safely definite use of hands Standing with eyes closed: 2.     Able to stand for 3 seconds Standing with feet together: 3.     Stands for 1 minute with supervision Reaching forward with outstretched arm: 2.     Reaches forward 2 inches Retrieving object from the floor: 3.     Able to pick up with supervision Turning to look behind: 1.  Needs supervision when turning Turning 360 degrees: 1.     Needs supervision or verbal cueing Place alternate foot on stool: 0.     Unable, needs assist to keep from falling Standing with one foot in front: 1.     Needs help to step, but can hold for 15 seconds Standing on one foot: 1.     Holds <3 seconds  Total Score: 29/56   GAIT: Distance walked: 125 ft Assistive device utilized: Single point cane Level of assistance: Modified independence Comments: bil genu valgum, decreased step length/ stride bil    TODAY'S TREATMENT: [facility] Adult PT Treatment:                                                DATE: 11/07/2021 Therapeutic Exercise: Nu-step L 5 x 5 min UE/LE Sit to stand 2 x 10 from table, verbal cues on proper form, used ball between knees to reduce Rknee valgus Seated hamstring stretch 2 x 30 seconds, demonstration and verbal cues required Seated marching with RTB around the knees, Verbal cues to avoid rocking backward Seated UE ball press 2 x 10 holding each rep 2 x 2 seconds Seated clamshell with RTB 2 x 15 Neuromuscular re-ed: Standing balance Rhomberg 2 x 30 sec Eyes open, 2 x30 sec  Standing on airex pad rhomberg 2 x 30 sec eyes open, 2 x 30 sec eyes closed (with  increased postural sway    [facility] Adult PT Treatment:                                                DATE: EVAL Therapeutic Exercise: LAQ w quad set 1 x 5 holding 2 sec ea Seated marching 2 x 10 (verbal cues to avoid leaning backward) Glute set 1 x 10 holding 5 sec Sit to and 1 x 5  Therapeutic Activity: To assist with sit to stand, scooting to edge of seat, bending the knees, and rocking forward to assist with standing.    PATIENT EDUCATION:  Education details: Evaluation findings, POC, goals, HEP with proper form/ rationale. Discussed findings of balance assessment and to bring in his RW to maximize his safety. Person educated: Patient Education method: Explanation, Verbal cues, and Handouts Education comprehension: verbalized understanding   HOME EXERCISE PROGRAM: Access Code: [email or web address] URL: [email or web address] Date: 10/31/2021 Prepared by: Starr Lake  Exercises - Seated Gluteal Sets  - 1 x daily - 7 x weekly - 3 sets - 10 reps - 5 seconds hold - Seated Long Arc Quad  - 1 x daily - 7 x weekly - 3 sets - 10 reps - 2 seconds hold - Seated March  - 1 x daily - 7 x weekly - 3 sets - 10 reps - Seated Hip Abduction with Resistance  - 1 x daily - 7 x weekly - 2 sets - 10 reps - Seated Heel Toe Raises  - 1 x daily - 7 x weekly - 3 sets - 10 reps - Sit to Stand  - 1 x daily - 7 x weekly - 2 sets - 10 reps  ASSESSMENT:  CLINICAL IMPRESSION: Pt arrives to his follow up session after his evaluation and reports consistency with his HEP but continues to  have 6/10 knee pain. He did well with exercises requring review of sit to stand biomechanics and utilized ball between knees to reduce valgus positioning of the R knee. He did well with core strengthening and balance training on both stable and unstable surfaces demonstrating increased way with Eyes closed.    OBJECTIVE IMPAIRMENTS decreased activity tolerance, decreased balance, decreased endurance, decreased  strength, improper body mechanics, postural dysfunction, and pain.   ACTIVITY LIMITATIONS carrying, lifting, bending, sitting, standing, squatting, and stairs  PARTICIPATION LIMITATIONS: cleaning, laundry, shopping, and community activity  PERSONAL FACTORS Age and 3+ comorbidities: Falls, anxiety, depression, hx of Cx  are also affecting patient's functional outcome.   REHAB POTENTIAL: Good  CLINICAL DECISION MAKING: Evolving/moderate complexity  EVALUATION COMPLEXITY: Moderate   GOALS: Goals reviewed with patient? Yes  SHORT TERM GOALS: Target date: 12/05/2021  Pt to be IND with initial HEP for therapeutic progression Baseline: Goal status: INITIAL  2.  Increase LE strength by >/= 1 MMT grade to promote strengthening Baseline:  Goal status: INITIAL  3.  Improve Berg balance score by >/= 6 points to promote balance and stability  Baseline:  Goal status: INITIAL   LONG TERM GOALS: Target date: 01/02/2022   Increase bil LE strength to >/= 4/5 to promote strength to maximize stability Baseline:  Goal status: INITIAL  2.  Pt to be able to walk/ stand for >/= 45 min and navigate up/ down >/= 12 steps with LRAD with </= 5/10 pain with no report of buckling or report of instability  Baseline:  Goal status: INITIAL  3.  Pt to increase Berg balance to >/= 45 to demo improvement in balance and safety and reduce falls Baseline:  Goal status: INITIAL  4.  Improve FOTO to >/= 42 to demo improvement in function Baseline:  Goal status: INITIAL  5. Pt to repot no falls in the last 4 weeks and </=min report of instability and falling  Baseline:  Goal status: INITIAL  6.  Pt to be IND with all HEP and will be able to maintain and progress current LOF IND Baseline:  Goal status: INITIAL    PLAN: PT FREQUENCY: 1-2x/week  PT DURATION: 8 weeks  PLANNED INTERVENTIONS: Therapeutic exercises, Therapeutic activity, Neuromuscular re-education, Balance training, Gait training,  Patient/Family education, Self Care, Joint mobilization, Stair training, Cryotherapy, Moist heat, Manual therapy, and Re-evaluation  PLAN FOR NEXT SESSION: Review and update HEP PRN. Gross bil LE strengthening, sit to stand biomechanics, seated balance on unstable surface   Nicloe Frontera PT, DPT, LAT, ATC  11/07/21  9:44 AM

## 2021-11-09 ENCOUNTER — Ambulatory Visit: Payer: Medicare Other | Admitting: Physical Therapy

## 2021-11-09 ENCOUNTER — Telehealth: Payer: Self-pay

## 2021-11-09 NOTE — Telephone Encounter (Signed)
Patient calls nurse line requesting increased hours of personal care services.   Patient reports he is needing at least 3 extra hours per day.   PCS form placed in PCP box for review.

## 2021-11-14 ENCOUNTER — Encounter: Payer: Medicare Other | Admitting: Physical Therapy

## 2021-11-16 ENCOUNTER — Ambulatory Visit: Payer: Medicare Other | Admitting: Physical Therapy

## 2021-11-21 ENCOUNTER — Ambulatory Visit (INDEPENDENT_AMBULATORY_CARE_PROVIDER_SITE_OTHER): Payer: Medicare Other | Admitting: Student

## 2021-11-21 ENCOUNTER — Encounter: Payer: Self-pay | Admitting: Student

## 2021-11-21 ENCOUNTER — Encounter: Payer: Self-pay | Admitting: Physical Therapy

## 2021-11-21 ENCOUNTER — Ambulatory Visit: Payer: Medicare Other | Attending: Family Medicine | Admitting: Physical Therapy

## 2021-11-21 VITALS — BP 156/76 | HR 58 | Ht 70.0 in | Wt 183.6 lb

## 2021-11-21 DIAGNOSIS — K219 Gastro-esophageal reflux disease without esophagitis: Secondary | ICD-10-CM

## 2021-11-21 DIAGNOSIS — R2689 Other abnormalities of gait and mobility: Secondary | ICD-10-CM | POA: Insufficient documentation

## 2021-11-21 DIAGNOSIS — I10 Essential (primary) hypertension: Secondary | ICD-10-CM | POA: Diagnosis not present

## 2021-11-21 DIAGNOSIS — J449 Chronic obstructive pulmonary disease, unspecified: Secondary | ICD-10-CM

## 2021-11-21 DIAGNOSIS — R296 Repeated falls: Secondary | ICD-10-CM | POA: Insufficient documentation

## 2021-11-21 DIAGNOSIS — M6281 Muscle weakness (generalized): Secondary | ICD-10-CM | POA: Diagnosis not present

## 2021-11-21 DIAGNOSIS — Z789 Other specified health status: Secondary | ICD-10-CM

## 2021-11-21 MED ORDER — AMLODIPINE BESYLATE 5 MG PO TABS: 5.0000 mg | ORAL_TABLET | Freq: Every day | ORAL | 3 refills | Status: DC

## 2021-11-21 MED ORDER — OMEPRAZOLE 40 MG PO CPDR: DELAYED_RELEASE_CAPSULE | ORAL | 2 refills | Status: DC

## 2021-11-21 MED ORDER — ALBUTEROL SULFATE HFA 108 (90 BASE) MCG/ACT IN AERS: INHALATION_SPRAY | RESPIRATORY_TRACT | 3 refills | Status: DC

## 2021-11-21 NOTE — Patient Instructions (Addendum)
It was great to see you! Thank you for allowing me to participate in your care!   Our plans for today:  - I am sending a referral to [facility] to help with your personal care services forms - I have sent in refills of amlodipine, albuterol and omeprazole  Take care and seek immediate care sooner if you develop any concerns.  Gerrit Heck, MD

## 2021-11-21 NOTE — Assessment & Plan Note (Addendum)
Patient unable to perform ADLs due to knee pain and history of frequent falls.  Has had increase in pain and knee issues.  Following with physical therapy. - [facility] referral for personal care services likely needs 4 hours of service a day currently only receiving 2 hours

## 2021-11-21 NOTE — Progress Notes (Signed)
    SUBJECTIVE:   CHIEF COMPLAINT / HPI:   [place] Patient receives personal care services for ADLs.  He needs help dressing himself, bathing himself, medication administration, transferring.  He has a form that was given to me in my inbox.  I discussed I can involve chronic care management as well for completion of this form.   Hypertension: Patient is a 81 y.o. male who present today for follow up of hypertension.   Patient endorses no problems  Home medications include: amlodipine 5 mg daily, and lisinopril 40 mg daily Patient endorses taking these medications as prescribed. Denies any headache, vision changes, shortness of breath, lower extremity swelling or chest pain   Most recent creatinine trend:  Lab Results  Component Value Date   CREATININE 0.83 09/01/2021   CREATININE 0.83 05/25/2021   CREATININE 0.78 01/17/2021    PERTINENT  PMH / PSH: COPD  OBJECTIVE:   BP (!) 156/76   Pulse (!) 58   Ht '5\' 10"'$  (1.778 m)   Wt 183 lb 9.6 oz (83.3 kg)   SpO2 100%   BMI 26.34 kg/m   General: NAD, awake, alert, responsive to questions Head: Normocephalic atraumatic CV: Regular rate and rhythm  Respiratory: Clear to ausculation bilaterally, no increased work of breathing Extremities: no edema in LE, mild resting tremor on examination and hands bilaterally, knees without significant warmth or tenderness to palpation, relates with cane  ASSESSMENT/PLAN:   Deficit in activities of daily living (ADL) Patient unable to perform ADLs due to knee pain and history of frequent falls.  Has had increase in pain and knee issues.  Following with physical therapy. - [facility] referral for personal care services likely needs 4 hours of service a day currently only receiving 2 hours  Essential hypertension Does not appear to be taking his amlodipine as it does show up as low on epic chart.  He does attest to taking his amlodipine and lisinopril daily.  I discussed I will send in a  refill for his amlodipine.  I do not want to drive his blood pressure down too much as he has a history of frequent falls. -Refilled amlodipine 5 mg -Continue lisinopril 40 mg     Gerrit Heck, MD [place]

## 2021-11-21 NOTE — Therapy (Signed)
OUTPATIENT PHYSICAL THERAPY LOWER EXTREMITY / BALANCE   Patient Name: Joel Donaldson MRN: [id number] DOB:Dec 27, 1940, 81 y.o., male Today's Date: 11/21/2021   PT End of Session - 11/21/21 1010     Visit Number 3    Number of Visits 17    Date for PT Re-Evaluation 12/26/21    Authorization Type FOTO at 6th and 10th Visit,  KX mod at 15th visit    Progress Note Due on Visit 10    PT Start Time 1011    PT Stop Time 1056    PT Time Calculation (min) 45 min    Equipment Utilized During Treatment Other (comment)   single point cane   Activity Tolerance Patient tolerated treatment well    Behavior During Therapy [facility] for tasks assessed/performed               Past Medical History:  Diagnosis Date   Alcohol abuse    Anxiety    Arthritis    hands, back, knees   Asthma    Depression    GERD (gastroesophageal reflux disease)    Glaucoma    Heart murmur asymptomatic   Hx of radioisotope therapy 07/26/11   Radiosactive Prostate Seed Implant/ I-125 Seeds   Hypertension    Neck pain 10/22/2019   Prostate cancer ([place]) dx'd 2016 or 2017   seed implants   Rheumatoid arthritis ([place]) 10/10/2012   Past Surgical History:  Procedure Laterality Date   CYSTOSCOPY  09/14/2011   Procedure: CYSTOSCOPY FLEXIBLE;  Surgeon: Hanley Ben, MD;  Location: [place];  Service: Urology;  Laterality: N/A;  No seeds seen in the bladder   LEFT HEART CATH AND CORONARY ANGIOGRAPHY N/A 09/16/2020   Procedure: LEFT HEART CATH AND CORONARY ANGIOGRAPHY;  Surgeon: Leonie Man, MD;  Location: [place] [facility];  Service: Cardiovascular;  Laterality: N/A;   [place] IMPLANT  09/14/2011   Procedure: RADIOACTIVE SEED IMPLANT;  Surgeon: Hanley Ben, MD;  Location: [place];  Service: Urology;  Laterality: N/A;  Total number of seeds -     SHOULDER SURGERY  YRS AGO   LEFT   Patient Active Problem List   Diagnosis Date Noted   Fall  10/23/2021   Acute nonintractable headache 05/31/2021   Chest pain 01/17/2021   Age-related cataract of left eye 01/10/2021   Cataract 01/10/2021   Abnormal cardiac CT angiography 09/16/2020   Epigastric pain 05/20/2020   Atherosclerosis of aorta ([place]) 05/20/2020   Tachycardia    Opioid dependence ([place]) 11/29/2017   Essential hypertension    Chest pain at rest 10/04/2016   Baker's cyst of knee, right 06/21/2016   Right leg swelling 06/14/2016   Back pain 09/19/2015   Unspecified vitamin D deficiency 07/07/2013   GERD (gastroesophageal reflux disease) 07/03/2013   Rheumatoid arthritis ([place]) 10/10/2012   Osteoarthritis of both shoulders 02/15/2012   Tendinopathy of rotator cuff 01/20/2012   Atypical angina (HCC) (Class III) 12/19/2011   Preventative health care 12/03/2011   Osteoarthritis of both knees 07/02/2011   Alcohol use 05/01/2010   INTENTION TREMOR 09/09/2008   Prostate cancer ([place]) 07/01/2008   Hyperlipidemia 06/24/2007   COPD (chronic obstructive pulmonary disease) ([place]) 05/12/2007   GASTROESOPHAGEAL REFLUX, NO ESOPHAGITIS 04/11/2006    PCP: Gerrit Heck, MD  REFERRING PROVIDER: Lind Covert, MD  REFERRING DIAG: Fall, subsequent encounter   THERAPY DIAG:  Repeated falls  Muscle weakness (generalized)  Other abnormalities of gait  and mobility  Rationale for Evaluation and Treatment Rehabilitation  ONSET DATE: June 2023  SUBJECTIVE:   SUBJECTIVE STATEMENT: Pt arrives with report of knees aching, 6/10 B. Reports good HEP compliance, no issues. Mild soreness after last session  PERTINENT HISTORY: Hx of anxiety, depression, RA, Prostate Cx, Glaucoma  PAIN:  Are you having pain? Yes: NPRS scale: 5/10 Pain location: bil knees and shoulders Pain description: acing sore Aggravating factors: unsure, walking/ standing, stairs, lifting Relieving factors: pain medication,   PRECAUTIONS: Fall  WEIGHT BEARING RESTRICTIONS No  FALLS:  Has  patient fallen in last 6 months? Yes. Number of falls 3  LIVING ENVIRONMENT: Lives with: lives with their family Lives in: House/apartment Stairs: No Has following equipment at home: Single point cane, Environmental consultant - 2 wheeled, and shower chair  OCCUPATION: Disability   PLOF: Independent with basic ADLs  PATIENT GOALS get back to where he was before this.   OBJECTIVE:   DIAGNOSTIC FINDINGS:  10/14/2021 CT Head WO Contrast IMPRESSION: 1. No acute traumatic injury identified. 2. Stable non contrast CT appearance of the brain with mild to moderate for age white matter changes.  10/14/2021 R shoulder X-ray IMPRESSION: 1. No fracture or acute finding. 2. Marked narrowing of the subacromial space consistent with a chronic full-thickness rotator cuff tear.  10/14/2021 L Wrist X-ray IMPRESSION: 1. No acute fracture or dislocation. 2. Widened scapholunate interval consistent with chronic scapholunate ligament disruption. 3. Degenerative changes as detailed. Probable old ulnar styloid fracture.  PATIENT SURVEYS:  FOTO 31% predicted 42%  COGNITION:  Overall cognitive status: Within functional limits for tasks assessed     SENSATION: WFL  POSTURE: rounded shoulders, forward head, and bil genu valgum  PALPATION: Gross knee pain (pt unable to pinpoint a specific area of pain). R>L   LOWER EXTREMITY ROM:  Active ROM Right eval Left eval  Hip flexion 4-/5P! 4/5 P!  Hip extension 3/5 3/5  Hip abduction 3-/5 3-/5  Hip adduction 3/5 3/5  Hip internal rotation    Hip external rotation    Knee flexion 3/5 P! 3/5 P!  Knee extension 3+/5 P! 4-/5 P!  Ankle dorsiflexion    Ankle plantarflexion    Ankle inversion    Ankle eversion     (Blank rows = not tested)  LOWER EXTREMITY MMT:  MMT Right eval Left eval  Hip flexion [facility]  Hip extension [facility]  Hip abduction    Hip adduction    Hip internal rotation    Hip external rotation    Knee flexion [facility] WFL  Knee extension  [facility]  Ankle dorsiflexion [facility] WFL  Ankle plantarflexion    Ankle inversion    Ankle eversion     (Blank rows = not tested)   FUNCTIONAL TESTS:  5 times sit to stand: 36 with the use of bil UE Berg Balance Scale: 29/56 BERG BALANCE TEST Sitting to Standing: 3.      Stands independently using hands Standing Unsupported: 3.      Stands 2 minutes with supervision Sitting Unsupported: 4.     Sits for 2 minutes independently Standing to Sitting: 2.     Uses back of legs against chair to control descent  Transfers: 3.     Transfers safely definite use of hands Standing with eyes closed: 2.     Able to stand for 3 seconds Standing with feet together: 3.     Stands for 1 minute with supervision Reaching forward with outstretched arm: 2.  Reaches forward 2 inches Retrieving object from the floor: 3.     Able to pick up with supervision Turning to look behind: 1.     Needs supervision when turning Turning 360 degrees: 1.     Needs supervision or verbal cueing Place alternate foot on stool: 0.     Unable, needs assist to keep from falling Standing with one foot in front: 1.     Needs help to step, but can hold for 15 seconds Standing on one foot: 1.     Holds <3 seconds  Total Score: 29/56   GAIT: Distance walked: 125 ft Assistive device utilized: Single point cane Level of assistance: Modified independence Comments: bil genu valgum, decreased step length/ stride bil    TODAY'S TREATMENT: [facility] Adult PT Treatment:                                                DATE: 11/21/21 Therapeutic Exercise: Nu step L6 x40mn UE/LE during subjective Seated marches with 2# ankle weights B, 2x12 cues for upright posture and pacing Seated swiss ball press down 2x10 Heel raises at counter x8 B UE support, CGA Seated hamstring stretch 2x30 B LE cues for setup  Neuromuscular re-ed: Seated adductor isos 2x12 for lumbopelvic stability Airex mini lunge x8 B CGA for safety Retro walking along  counter 2x4 laps w SPC and CGA   [facility] Adult PT Treatment:                                                DATE: 11/07/2021 Therapeutic Exercise: Nu-step L 5 x 5 min UE/LE Sit to stand 2 x 10 from table, verbal cues on proper form, used ball between knees to reduce Rknee valgus Seated hamstring stretch 2 x 30 seconds, demonstration and verbal cues required Seated marching with RTB around the knees, Verbal cues to avoid rocking backward Seated UE ball press 2 x 10 holding each rep 2 x 2 seconds Seated clamshell with RTB 2 x 15 Neuromuscular re-ed: Standing balance Rhomberg 2 x 30 sec Eyes open, 2 x30 sec  Standing on airex pad rhomberg 2 x 30 sec eyes open, 2 x 30 sec eyes closed (with increased postural sway    [facility] Adult PT Treatment:                                                DATE: EVAL Therapeutic Exercise: LAQ w quad set 1 x 5 holding 2 sec ea Seated marching 2 x 10 (verbal cues to avoid leaning backward) Glute set 1 x 10 holding 5 sec Sit to and 1 x 5  Therapeutic Activity: To assist with sit to stand, scooting to edge of seat, bending the knees, and rocking forward to assist with standing.    PATIENT EDUCATION:  Education details: rationale for interventions, PT impairments and POC Person educated: Patient Education method: Explanation and Verbal cues Education comprehension: verbalized understanding   HOME EXERCISE PROGRAM: Access Code: [email or web address]: [email or web address] Date: 10/31/2021 Prepared by: [place] Exercises - Seated Gluteal Sets  - 1 x  daily - 7 x weekly - 3 sets - 10 reps - 5 seconds hold - Seated Long Arc Quad  - 1 x daily - 7 x weekly - 3 sets - 10 reps - 2 seconds hold - Seated March  - 1 x daily - 7 x weekly - 3 sets - 10 reps - Seated Hip Abduction with Resistance  - 1 x daily - 7 x weekly - 2 sets - 10 reps - Seated Heel Toe Raises  - 1 x daily - 7 x weekly - 3 sets - 10 reps - Sit to Stand  - 1 x daily - 7 x weekly -  2 sets - 10 reps  ASSESSMENT:  CLINICAL IMPRESSION: Pt arrives to session with report of aching knees, mild soreness after last session that resolved after a couple of days. Pt tolerates session well, minimal cueing required for familiar exercises. Able to progress for increased volume with exercises targeting dynamic postural stability and lumbopelvic/core activation. Pt denies any increase in pain throughout session, most difficulty noted on unstable surfaces with eyes closed and addition of retrowalking for improved posterior chain activation with environmental navigation. Pt does report some "pulling" discomfort posterior knees with addition of heel raises. Pt denies any increase in pain on departure compared to arrival, noted improvement in fluidity of gait mechanics upon departure.   Pt remains limited by generalized weakness, reduced activity tolerance, and impaired postural stability; recommend skilled PT to address these deficits and minimize fall risk. Pt departs today's session in no acute distress, all voiced questions/concerns addressed appropriately from PT perspective.       OBJECTIVE IMPAIRMENTS decreased activity tolerance, decreased balance, decreased endurance, decreased strength, improper body mechanics, postural dysfunction, and pain.   ACTIVITY LIMITATIONS carrying, lifting, bending, sitting, standing, squatting, and stairs  PARTICIPATION LIMITATIONS: cleaning, laundry, shopping, and community activity  PERSONAL FACTORS Age and 3+ comorbidities: Falls, anxiety, depression, hx of Cx  are also affecting patient's functional outcome.   REHAB POTENTIAL: Good  CLINICAL DECISION MAKING: Evolving/moderate complexity  EVALUATION COMPLEXITY: Moderate   GOALS: Goals reviewed with patient? Yes  SHORT TERM GOALS: Target date: 12/05/2021  Pt to be IND with initial HEP for therapeutic progression Baseline: Goal status: INITIAL  2.  Increase LE strength by >/= 1 MMT grade to  promote strengthening Baseline:  Goal status: INITIAL  3.  Improve Berg balance score by >/= 6 points to promote balance and stability  Baseline:  Goal status: INITIAL   LONG TERM GOALS: Target date: 01/02/2022   Increase bil LE strength to >/= 4/5 to promote strength to maximize stability Baseline:  Goal status: INITIAL  2.  Pt to be able to walk/ stand for >/= 45 min and navigate up/ down >/= 12 steps with LRAD with </= 5/10 pain with no report of buckling or report of instability  Baseline:  Goal status: INITIAL  3.  Pt to increase Berg balance to >/= 45 to demo improvement in balance and safety and reduce falls Baseline:  Goal status: INITIAL  4.  Improve FOTO to >/= 42 to demo improvement in function Baseline:  Goal status: INITIAL  5. Pt to repot no falls in the last 4 weeks and </=min report of instability and falling  Baseline:  Goal status: INITIAL  6.  Pt to be IND with all HEP and will be able to maintain and progress current LOF IND Baseline:  Goal status: INITIAL    PLAN: PT FREQUENCY: 1-2x/week  PT DURATION:  8 weeks  PLANNED INTERVENTIONS: Therapeutic exercises, Therapeutic activity, Neuromuscular re-education, Balance training, Gait training, Patient/Family education, Self Care, Joint mobilization, Stair training, Cryotherapy, Moist heat, Manual therapy, and Re-evaluation  PLAN FOR NEXT SESSION: Review and update HEP PRN. Continue gross LE strengthening, postural stability training as able/appropriate   Leeroy Cha PT, DPT 11/21/2021 10:59 AM

## 2021-11-21 NOTE — Assessment & Plan Note (Addendum)
Does not appear to be taking his amlodipine as it does show up as low on epic chart.  He does attest to taking his amlodipine and lisinopril daily.  I discussed I will send in a refill for his amlodipine.  I do not want to drive his blood pressure down too much as he has a history of frequent falls. -Refilled amlodipine 5 mg -Continue lisinopril 40 mg

## 2021-11-22 ENCOUNTER — Telehealth: Payer: Self-pay | Admitting: *Deleted

## 2021-11-22 NOTE — Chronic Care Management (AMB) (Signed)
  Care Coordination  Outreach Note  11/22/2021 Name: BRELAN HANNEN MRN: [id number] DOB: 1940/07/26   Care Coordination Outreach Attempts: An unsuccessful telephone outreach was attempted today to offer the patient information about available care coordination services as a benefit of their health plan.   Follow Up Plan:  Additional outreach attempts will be made to offer the patient care coordination information and services.   Encounter Outcome:  No Answer  [place]  Direct Dial: [phone]

## 2021-11-23 ENCOUNTER — Ambulatory Visit: Payer: Medicare Other | Admitting: Physical Therapy

## 2021-11-24 NOTE — Chronic Care Management (AMB) (Signed)
  Care Coordination   Note   11/24/2021 Name: JAYDENCE ARNESEN MRN: [id number] DOB: [birth year]  MARCELLIUS MONTAGNA is a 81 y.o. year old male who sees Gerrit Heck, MD for primary care. I reached out to [name] by phone today to offer care coordination services.  Mr. Hartshorn was given information about Care Coordination services today including:   The Care Coordination services include support from the care team which includes your Nurse Coordinator, Clinical Social Worker, or Pharmacist.  The Care Coordination team is here to help remove barriers to the health concerns and goals most important to you. Care Coordination services are voluntary, and the patient may decline or stop services at any time by request to their care team member.   Care Coordination Consent Status: Patient agreed to services and verbal consent obtained.   Follow up plan:  Telephone appointment with care coordination team member scheduled for:  11/27/21  Encounter Outcome:  Pt. Scheduled  [place]  Direct Dial: [phone]

## 2021-11-27 ENCOUNTER — Ambulatory Visit: Payer: Self-pay | Admitting: Licensed Clinical Social Worker

## 2021-11-27 DIAGNOSIS — M25511 Pain in right shoulder: Secondary | ICD-10-CM | POA: Diagnosis not present

## 2021-11-27 DIAGNOSIS — M546 Pain in thoracic spine: Secondary | ICD-10-CM | POA: Diagnosis not present

## 2021-11-27 DIAGNOSIS — M25561 Pain in right knee: Secondary | ICD-10-CM | POA: Diagnosis not present

## 2021-11-27 DIAGNOSIS — G8929 Other chronic pain: Secondary | ICD-10-CM | POA: Diagnosis not present

## 2021-11-27 DIAGNOSIS — M25512 Pain in left shoulder: Secondary | ICD-10-CM | POA: Diagnosis not present

## 2021-11-27 DIAGNOSIS — Z79899 Other long term (current) drug therapy: Secondary | ICD-10-CM | POA: Diagnosis not present

## 2021-11-27 DIAGNOSIS — M25562 Pain in left knee: Secondary | ICD-10-CM | POA: Diagnosis not present

## 2021-11-27 NOTE — Patient Instructions (Signed)
Visit Information  Thank you for taking time to visit with me today. Please don't hesitate to contact me if I can be of assistance to you.   Following are the goals we discussed today:   Goals Addressed               This Visit's Progress     Care Coordination Activities- CAP (pt-stated)        Patient is requesting increased hours with CAP services. Patient advised he has already completed paperwork with PCP. SW will follow up with patient within 30 days.   Patient declined needing assistance with food, housing or transportation.           Patient verbalizes understanding of instructions and care plan provided today and agrees to view in [place]. Active MyChart status and patient understanding of how to access instructions and care plan via MyChart confirmed with patient.     The care management team will reach out to the patient again over the next 30 days.   Milus Height, Arita Miss, MSW, [place]  Social Worker [facility] Care Management  [phone]

## 2021-11-27 NOTE — Patient Outreach (Signed)
  Care Coordination   Initial Visit Note   11/27/2021 Name: Joel Donaldson MRN: [id number] DOB: February 04, 1941  Joel Donaldson is a 82 y.o. year old male who sees Joel Heck, MD for primary care. I spoke with  Joel Donaldson by phone today.  What matters to the patients health and wellness today?  CAP services    Goals Addressed               This Visit's Progress     Care Coordination Activities- CAP (pt-stated)        Patient is requesting increased hours with CAP services. Patient advised he has already completed paperwork with PCP. SW will follow up with patient within 30 days.   Patient declined needing assistance with food, housing or transportation.         SDOH assessments and interventions completed:  Yes     Care Coordination Interventions Activated:  Yes  Care Coordination Interventions:  Yes, provided   Follow up plan: No further intervention required.   Encounter Outcome:  Pt. Visit Completed

## 2021-11-28 ENCOUNTER — Ambulatory Visit: Payer: Medicare Other | Admitting: Physical Therapy

## 2021-11-28 ENCOUNTER — Encounter: Payer: Self-pay | Admitting: Physical Therapy

## 2021-11-28 DIAGNOSIS — M6281 Muscle weakness (generalized): Secondary | ICD-10-CM

## 2021-11-28 DIAGNOSIS — R296 Repeated falls: Secondary | ICD-10-CM | POA: Diagnosis not present

## 2021-11-28 DIAGNOSIS — R2689 Other abnormalities of gait and mobility: Secondary | ICD-10-CM | POA: Diagnosis not present

## 2021-11-28 NOTE — Therapy (Signed)
OUTPATIENT PHYSICAL THERAPY LOWER EXTREMITY / BALANCE   Patient Name: Joel Donaldson MRN: [id number] DOB:25-Sep-1940, 81 y.o., male Today's Date: 11/28/2021   PT End of Session - 11/28/21 1015     Visit Number 4    Number of Visits 17    Date for PT Re-Evaluation 12/26/21    Authorization Type FOTO at 6th and 10th Visit,  KX mod at 15th visit    Progress Note Due on Visit 10    PT Start Time 1015    PT Stop Time 1100    PT Time Calculation (min) 45 min               Past Medical History:  Diagnosis Date   Alcohol abuse    Anxiety    Arthritis    hands, back, knees   Asthma    Depression    GERD (gastroesophageal reflux disease)    Glaucoma    Heart murmur asymptomatic   Hx of radioisotope therapy 07/26/11   Radiosactive Prostate Seed Implant/ I-125 Seeds   Hypertension    Neck pain 10/22/2019   Prostate cancer ([place]) dx'd 2016 or 2017   seed implants   Rheumatoid arthritis ([place]) 10/10/2012   Past Surgical History:  Procedure Laterality Date   CYSTOSCOPY  09/14/2011   Procedure: CYSTOSCOPY FLEXIBLE;  Surgeon: Hanley Ben, MD;  Location: [facility];  Service: Urology;  Laterality: N/A;  No seeds seen in the bladder   LEFT HEART CATH AND CORONARY ANGIOGRAPHY N/A 09/16/2020   Procedure: LEFT HEART CATH AND CORONARY ANGIOGRAPHY;  Surgeon: Leonie Man, MD;  Location: [facility];  Service: Cardiovascular;  Laterality: N/A;   [place] IMPLANT  09/14/2011   Procedure: RADIOACTIVE SEED IMPLANT;  Surgeon: Hanley Ben, MD;  Location: [facility];  Service: Urology;  Laterality: N/A;  Total number of seeds -     SHOULDER SURGERY  YRS AGO   LEFT   Patient Active Problem List   Diagnosis Date Noted   Deficit in activities of daily living (ADL) 11/21/2021   Fall 10/23/2021   Acute nonintractable headache 05/31/2021   Chest pain 01/17/2021   Age-related cataract of left eye 01/10/2021   Cataract  01/10/2021   Abnormal cardiac CT angiography 09/16/2020   Epigastric pain 05/20/2020   Atherosclerosis of aorta ([place]) 05/20/2020   Tachycardia    Opioid dependence ([place]) 11/29/2017   Essential hypertension    Chest pain at rest 10/04/2016   Baker's cyst of knee, right 06/21/2016   Right leg swelling 06/14/2016   Back pain 09/19/2015   Unspecified vitamin D deficiency 07/07/2013   GERD (gastroesophageal reflux disease) 07/03/2013   Rheumatoid arthritis ([place]) 10/10/2012   Osteoarthritis of both shoulders 02/15/2012   Tendinopathy of rotator cuff 01/20/2012   Atypical angina ([facility]) (Class III) 12/19/2011   Preventative health care 12/03/2011   Osteoarthritis of both knees 07/02/2011   Alcohol use 05/01/2010   INTENTION TREMOR 09/09/2008   Prostate cancer ([place]) 07/01/2008   Hyperlipidemia 06/24/2007   COPD (chronic obstructive pulmonary disease) ([place]) 05/12/2007   GASTROESOPHAGEAL REFLUX, NO ESOPHAGITIS 04/11/2006    PCP: Gerrit Heck, MD  REFERRING PROVIDER: Lind Covert, MD  REFERRING DIAG: Fall, subsequent encounter   THERAPY DIAG:  Repeated falls  Muscle weakness (generalized)  Rationale for Evaluation and Treatment Rehabilitation  ONSET DATE: June 2023  SUBJECTIVE:   SUBJECTIVE STATEMENT: Pt arrives with report of knees aching, 6/10 B.  Shoulders are 7/10. Reports good HEP compliance, no issues. Continues with mild soreness after PT sessions.   PERTINENT HISTORY: Hx of anxiety, depression, RA, Prostate Cx, Glaucoma  PAIN:  Are you having pain? Yes: NPRS scale: 6-7/10 Pain location: bil knees and shoulders Pain description: acing sore Aggravating factors: unsure, walking/ standing, stairs, lifting Relieving factors: pain medication,   PRECAUTIONS: Fall  WEIGHT BEARING RESTRICTIONS No  FALLS:  Has patient fallen in last 6 months? Yes. Number of falls 3  LIVING ENVIRONMENT: Lives with: lives with their family Lives in:  House/apartment Stairs: No Has following equipment at home: Single point cane, Environmental consultant - 2 wheeled, and shower chair  OCCUPATION: Disability   PLOF: Independent with basic ADLs  PATIENT GOALS get back to where he was before this.   OBJECTIVE:   DIAGNOSTIC FINDINGS:  10/14/2021 CT Head WO Contrast IMPRESSION: 1. No acute traumatic injury identified. 2. Stable non contrast CT appearance of the brain with mild to moderate for age white matter changes.  10/14/2021 R shoulder X-ray IMPRESSION: 1. No fracture or acute finding. 2. Marked narrowing of the subacromial space consistent with a chronic full-thickness rotator cuff tear.  10/14/2021 L Wrist X-ray IMPRESSION: 1. No acute fracture or dislocation. 2. Widened scapholunate interval consistent with chronic scapholunate ligament disruption. 3. Degenerative changes as detailed. Probable old ulnar styloid fracture.  PATIENT SURVEYS:  FOTO 31% predicted 42%  COGNITION:  Overall cognitive status: Within functional limits for tasks assessed     SENSATION: WFL  POSTURE: rounded shoulders, forward head, and bil genu valgum  PALPATION: Gross knee pain (pt unable to pinpoint a specific area of pain). R>L   LOWER EXTREMITY ROM:  Active ROM Right eval Left eval Right  11/28/21 Left 11/28/21  Hip flexion 4-/5P! 4/5 P! 4+ 4+  Hip extension 3/5 3/5    Hip abduction 3-/5 3-/5    Hip adduction 3/5 3/5    Hip internal rotation      Hip external rotation      Knee flexion 3/5 P! 3/5 P! 4 4  Knee extension 3+/5 P! 4-/5 P! 4 P! 4 P!  Ankle dorsiflexion      Ankle plantarflexion      Ankle inversion      Ankle eversion       (Blank rows = not tested)  LOWER EXTREMITY MMT:  MMT Right eval Left eval  Hip flexion [facility]  Hip extension [facility]  Hip abduction    Hip adduction    Hip internal rotation    Hip external rotation    Knee flexion [facility] WFL  Knee extension [facility]  Ankle dorsiflexion [facility] WFL  Ankle  plantarflexion    Ankle inversion    Ankle eversion     (Blank rows = not tested)   FUNCTIONAL TESTS:  5 times sit to stand: 36 with the use of bil UE Berg Balance Scale: 29/56 BERG BALANCE TEST Sitting to Standing: 3.      Stands independently using hands Standing Unsupported: 3.      Stands 2 minutes with supervision Sitting Unsupported: 4.     Sits for 2 minutes independently Standing to Sitting: 2.     Uses back of legs against chair to control descent  Transfers: 3.     Transfers safely definite use of hands Standing with eyes closed: 2.     Able to stand for 3 seconds Standing with feet together: 3.     Stands for 1 minute  with supervision Reaching forward with outstretched arm: 2.     Reaches forward 2 inches Retrieving object from the floor: 3.     Able to pick up with supervision Turning to look behind: 1.     Needs supervision when turning Turning 360 degrees: 1.     Needs supervision or verbal cueing Place alternate foot on stool: 0.     Unable, needs assist to keep from falling Standing with one foot in front: 1.     Needs help to step, but can hold for 15 seconds Standing on one foot: 1.     Holds <3 seconds  Total Score: 29/56   GAIT: Distance walked: 125 ft Assistive device utilized: Single point cane Level of assistance: Modified independence Comments: bil genu valgum, decreased step length/ stride bil    TODAY'S TREATMENT: [facility] Adult PT Treatment:                                                DATE: 11/28/21 Therapeutic Exercise: MMT Standing red band rows 10x2 Standing red band ext 10x 2 - c/o increasing posterior knee pain in standing  Nustep L 5 UE/LE x 5 minutes  Bridge 10 x 2  Side clam AROM 10 x 2  STS 10 x 2   Neuromuscular re-ed: Tandem stance 45 sec each without UE or LOB   [facility] Adult PT Treatment:                                                DATE: 11/21/21 Therapeutic Exercise: Nu step L6 x56mn UE/LE during subjective Seated marches  with 2# ankle weights B, 2x12 cues for upright posture and pacing Seated swiss ball press down 2x10 Heel raises at counter x8 B UE support, CGA Seated hamstring stretch 2x30 B LE cues for setup  Neuromuscular re-ed: Seated adductor isos 2x12 for lumbopelvic stability Airex mini lunge x8 B CGA for safety Retro walking along counter 2x4 laps w SPC and CGA   [facility] Adult PT Treatment:                                                DATE: 11/07/2021 Therapeutic Exercise: Nu-step L 5 x 5 min UE/LE Sit to stand 2 x 10 from table, verbal cues on proper form, used ball between knees to reduce Rknee valgus Seated hamstring stretch 2 x 30 seconds, demonstration and verbal cues required Seated marching with RTB around the knees, Verbal cues to avoid rocking backward Seated UE ball press 2 x 10 holding each rep 2 x 2 seconds Seated clamshell with RTB 2 x 15 Neuromuscular re-ed: Standing balance Rhomberg 2 x 30 sec Eyes open, 2 x30 sec  Standing on airex pad rhomberg 2 x 30 sec eyes open, 2 x 30 sec eyes closed (with increased postural sway    [facility] Adult PT Treatment:  DATE: EVAL Therapeutic Exercise: LAQ w quad set 1 x 5 holding 2 sec ea Seated marching 2 x 10 (verbal cues to avoid leaning backward) Glute set 1 x 10 holding 5 sec Sit to and 1 x 5  Therapeutic Activity: To assist with sit to stand, scooting to edge of seat, bending the knees, and rocking forward to assist with standing.    PATIENT EDUCATION:  Education details: rationale for interventions, PT impairments and POC Person educated: Patient Education method: Explanation and Verbal cues Education comprehension: verbalized understanding   HOME EXERCISE PROGRAM: Access Code: [email or web address] URL: [email or web address] Date: 10/31/2021 Prepared by: Starr Lake  Exercises - Seated Gluteal Sets  - 1 x daily - 7 x weekly - 3 sets - 10 reps - 5 seconds hold - Seated  Long Arc Quad  - 1 x daily - 7 x weekly - 3 sets - 10 reps - 2 seconds hold - Seated March  - 1 x daily - 7 x weekly - 3 sets - 10 reps - Seated Hip Abduction with Resistance  - 1 x daily - 7 x weekly - 2 sets - 10 reps - Seated Heel Toe Raises  - 1 x daily - 7 x weekly - 3 sets - 10 reps - Sit to Stand  - 1 x daily - 7 x weekly - 2 sets - 10 reps  ASSESSMENT:  CLINICAL IMPRESSION: Pt arrives to session with report of aching knees and shoulders. Worked on standing postural stabilization with scapular bands. He reported increased posterior knee pain while standing. His hip flexion and knee MMT have all improved. Continued with Gluteal strengthening with good tolerance. With tandem stance trials, he was able to hold 45 sec each position without UE or LOB. Pt denies any increase in pain on departure compared to arrival, he reports being surprised that he could hold tandem stance for 45 sec and was pleased to hear his MMT improvements. He verbalized eagerness to continue improving.    OBJECTIVE IMPAIRMENTS decreased activity tolerance, decreased balance, decreased endurance, decreased strength, improper body mechanics, postural dysfunction, and pain.   ACTIVITY LIMITATIONS carrying, lifting, bending, sitting, standing, squatting, and stairs  PARTICIPATION LIMITATIONS: cleaning, laundry, shopping, and community activity  PERSONAL FACTORS Age and 3+ comorbidities: Falls, anxiety, depression, hx of Cx  are also affecting patient's functional outcome.   REHAB POTENTIAL: Good  CLINICAL DECISION MAKING: Evolving/moderate complexity  EVALUATION COMPLEXITY: Moderate   GOALS: Goals reviewed with patient? Yes  SHORT TERM GOALS: Target date: 12/05/2021  Pt to be IND with initial HEP for therapeutic progression Baseline: Goal status: INITIAL  2.  Increase LE strength by >/= 1 MMT grade to promote strengthening Baseline:  Goal status: INITIAL  3.  Improve Berg balance score by >/= 6 points to  promote balance and stability  Baseline:  Goal status: INITIAL   LONG TERM GOALS: Target date: 01/02/2022   Increase bil LE strength to >/= 4/5 to promote strength to maximize stability Baseline:  Goal status: INITIAL  2.  Pt to be able to walk/ stand for >/= 45 min and navigate up/ down >/= 12 steps with LRAD with </= 5/10 pain with no report of buckling or report of instability  Baseline:  Goal status: INITIAL  3.  Pt to increase Berg balance to >/= 45 to demo improvement in balance and safety and reduce falls Baseline:  Goal status: INITIAL  4.  Improve FOTO to >/= 42 to demo improvement in function Baseline:  Goal  status: INITIAL  5. Pt to repot no falls in the last 4 weeks and </=min report of instability and falling  Baseline:  Goal status: INITIAL  6.  Pt to be IND with all HEP and will be able to maintain and progress current LOF IND Baseline:  Goal status: INITIAL    PLAN: PT FREQUENCY: 1-2x/week  PT DURATION: 8 weeks  PLANNED INTERVENTIONS: Therapeutic exercises, Therapeutic activity, Neuromuscular re-education, Balance training, Gait training, Patient/Family education, Self Care, Joint mobilization, Stair training, Cryotherapy, Moist heat, Manual therapy, and Re-evaluation  PLAN FOR NEXT SESSION: Review and update HEP PRN. Continue gross LE strengthening, postural stability training as able/appropriate, check STGs   Hessie Diener, PTA 11/28/21 12:29 PM Phone: [phone] Fax: [phone]

## 2021-11-30 ENCOUNTER — Encounter: Payer: Self-pay | Admitting: Physical Therapy

## 2021-11-30 ENCOUNTER — Ambulatory Visit: Payer: Medicare Other | Admitting: Physical Therapy

## 2021-11-30 DIAGNOSIS — R296 Repeated falls: Secondary | ICD-10-CM | POA: Diagnosis not present

## 2021-11-30 DIAGNOSIS — R2689 Other abnormalities of gait and mobility: Secondary | ICD-10-CM | POA: Diagnosis not present

## 2021-11-30 DIAGNOSIS — M6281 Muscle weakness (generalized): Secondary | ICD-10-CM

## 2021-11-30 NOTE — Therapy (Addendum)
OUTPATIENT PHYSICAL THERAPY LOWER EXTREMITY / BALANCE / DISCHARGE   Patient Name: Joel Donaldson MRN: [id number] DOB:1940-06-30, 81 y.o., male Today's Date: 11/30/2021   PT End of Session - 11/30/21 1057     Visit Number 5    Number of Visits 17    Date for PT Re-Evaluation 12/26/21    Authorization Type FOTO at 6th and 10th Visit,  KX mod at 15th visit    Progress Note Due on Visit 10    PT Start Time 1045    PT Stop Time 1132    PT Time Calculation (min) 47 min    Equipment Utilized During Treatment Gait belt    Behavior During Therapy WFL for tasks assessed/performed                Past Medical History:  Diagnosis Date   Alcohol abuse    Anxiety    Arthritis    hands, back, knees   Asthma    Depression    GERD (gastroesophageal reflux disease)    Glaucoma    Heart murmur asymptomatic   Hx of radioisotope therapy 07/26/11   Radiosactive Prostate Seed Implant/ I-125 Seeds   Hypertension    Neck pain 10/22/2019   Prostate cancer ([place]) dx'd 2016 or 2017   seed implants   Rheumatoid arthritis ([place]) 10/10/2012   Past Surgical History:  Procedure Laterality Date   CYSTOSCOPY  09/14/2011   Procedure: CYSTOSCOPY FLEXIBLE;  Surgeon: Hanley Ben, MD;  Location: [facility];  Service: Urology;  Laterality: N/A;  No seeds seen in the bladder   LEFT HEART CATH AND CORONARY ANGIOGRAPHY N/A 09/16/2020   Procedure: LEFT HEART CATH AND CORONARY ANGIOGRAPHY;  Surgeon: Leonie Man, MD;  Location: [facility];  Service: Cardiovascular;  Laterality: N/A;   [place] IMPLANT  09/14/2011   Procedure: RADIOACTIVE SEED IMPLANT;  Surgeon: Hanley Ben, MD;  Location: [facility];  Service: Urology;  Laterality: N/A;  Total number of seeds -     SHOULDER SURGERY  YRS AGO   LEFT   Patient Active Problem List   Diagnosis Date Noted   Deficit in activities of daily living (ADL) 11/21/2021   Fall 10/23/2021    Acute nonintractable headache 05/31/2021   Chest pain 01/17/2021   Age-related cataract of left eye 01/10/2021   Cataract 01/10/2021   Abnormal cardiac CT angiography 09/16/2020   Epigastric pain 05/20/2020   Atherosclerosis of aorta ([place]) 05/20/2020   Tachycardia    Opioid dependence ([place]) 11/29/2017   Essential hypertension    Chest pain at rest 10/04/2016   Baker's cyst of knee, right 06/21/2016   Right leg swelling 06/14/2016   Back pain 09/19/2015   Unspecified vitamin D deficiency 07/07/2013   GERD (gastroesophageal reflux disease) 07/03/2013   Rheumatoid arthritis ([place]) 10/10/2012   Osteoarthritis of both shoulders 02/15/2012   Tendinopathy of rotator cuff 01/20/2012   Atypical angina (HCC) (Class III) 12/19/2011   Preventative health care 12/03/2011   Osteoarthritis of both knees 07/02/2011   Alcohol use 05/01/2010   INTENTION TREMOR 09/09/2008   Prostate cancer ([place]) 07/01/2008   Hyperlipidemia 06/24/2007   COPD (chronic obstructive pulmonary disease) ([place]) 05/12/2007   GASTROESOPHAGEAL REFLUX, NO ESOPHAGITIS 04/11/2006    PCP: Gerrit Heck, MD  REFERRING PROVIDER: Lind Covert, MD  REFERRING DIAG: Fall, subsequent encounter   THERAPY DIAG:  Repeated falls  Muscle weakness (generalized)  Other abnormalities of gait  and mobility  Rationale for Evaluation and Treatment Rehabilitation  ONSET DATE: June 2023  SUBJECTIVE:   SUBJECTIVE STATEMENT: Pt arrives to session noting he has been doing his exercises and overall reports he feels he is doing a little better with both knee pain and with his balance. " I have no pain today"  PERTINENT HISTORY: Hx of anxiety, depression, RA, Prostate Cx, Glaucoma  PAIN:  Are you having pain? Yes: NPRS scale: 0/10 Pain location: bil knees and shoulders Pain description: acing sore Aggravating factors: unsure, walking/ standing, stairs, lifting Relieving factors: pain medication,   PRECAUTIONS:  Fall  WEIGHT BEARING RESTRICTIONS No  FALLS:  Has patient fallen in last 6 months? Yes. Number of falls 3  LIVING ENVIRONMENT: Lives with: lives with their family Lives in: House/apartment Stairs: No Has following equipment at home: Single point cane, Environmental consultant - 2 wheeled, and shower chair  OCCUPATION: Disability   PLOF: Independent with basic ADLs  PATIENT GOALS get back to where he was before this.   OBJECTIVE:   DIAGNOSTIC FINDINGS:  10/14/2021 CT Head WO Contrast IMPRESSION: 1. No acute traumatic injury identified. 2. Stable non contrast CT appearance of the brain with mild to moderate for age white matter changes.  10/14/2021 R shoulder X-ray IMPRESSION: 1. No fracture or acute finding. 2. Marked narrowing of the subacromial space consistent with a chronic full-thickness rotator cuff tear.  10/14/2021 L Wrist X-ray IMPRESSION: 1. No acute fracture or dislocation. 2. Widened scapholunate interval consistent with chronic scapholunate ligament disruption. 3. Degenerative changes as detailed. Probable old ulnar styloid fracture.  PATIENT SURVEYS:  FOTO 31% predicted 42%  COGNITION:  Overall cognitive status: Within functional limits for tasks assessed     SENSATION: WFL  POSTURE: rounded shoulders, forward head, and bil genu valgum  PALPATION: Gross knee pain (pt unable to pinpoint a specific area of pain). R>L   LOWER EXTREMITY ROM:  Active ROM Right eval Left eval Right  11/28/21 Left 11/28/21  Hip flexion 4-/5P! 4/5 P! 4+ 4+  Hip extension 3/5 3/5    Hip abduction 3-/5 3-/5    Hip adduction 3/5 3/5    Hip internal rotation      Hip external rotation      Knee flexion 3/5 P! 3/5 P! 4 4  Knee extension 3+/5 P! 4-/5 P! 4 P! 4 P!  Ankle dorsiflexion      Ankle plantarflexion      Ankle inversion      Ankle eversion       (Blank rows = not tested)  LOWER EXTREMITY MMT:  MMT Right eval Left eval  Hip flexion [facility]  Hip extension [facility]   Hip abduction    Hip adduction    Hip internal rotation    Hip external rotation    Knee flexion [facility] WFL  Knee extension [facility]  Ankle dorsiflexion [facility] WFL  Ankle plantarflexion    Ankle inversion    Ankle eversion     (Blank rows = not tested)   FUNCTIONAL TESTS:  5 times sit to stand: 36 with the use of bil UE Berg Balance Scale: 29/56 BERG BALANCE TEST Sitting to Standing: 3.      Stands independently using hands Standing Unsupported: 3.      Stands 2 minutes with supervision Sitting Unsupported: 4.     Sits for 2 minutes independently Standing to Sitting: 2.     Uses back of legs against chair to control descent  Transfers:  3.     Transfers safely definite use of hands Standing with eyes closed: 2.     Able to stand for 3 seconds Standing with feet together: 3.     Stands for 1 minute with supervision Reaching forward with outstretched arm: 2.     Reaches forward 2 inches Retrieving object from the floor: 3.     Able to pick up with supervision Turning to look behind: 1.     Needs supervision when turning Turning 360 degrees: 1.     Needs supervision or verbal cueing Place alternate foot on stool: 0.     Unable, needs assist to keep from falling Standing with one foot in front: 1.     Needs help to step, but can hold for 15 seconds Standing on one foot: 1.     Holds <3 seconds  Total Score: 29/56   GAIT: Distance walked: 125 ft Assistive device utilized: Single point cane Level of assistance: Modified independence Comments: bil genu valgum, decreased step length/ stride bil    TODAY'S TREATMENT: [facility] Adult PT Treatment:                                                DATE: 11/30/2021 Therapeutic Exercise: Nustep L 5 UE/LE x 5 minutes - verbal cues to keep the knees from touching during exercise Seated hamstring stretch 2x30 B LE verbal cues for setup STS with band around knees to engage glue med 2 x 12 with RTB - intermittent verbal cues for to keep knees  apart LAQ with ball between knees to reduce valgus 2 x 12 with 3# Standing hip abduction 2 x 10 with bil UE on counter Standing hip extension 2 x 10 with bil UE on counter Standing alternating L/R marching 2 x 10 with bil UE on counter  -Updated HEP for standing hip strengthening.  Neuromuscular re-ed: Obstacle course stepping over 1/2 bolster, then 1 hurdle and standing on airex pad x 30 sec x 5 Walking with changes in speed/ stopping unexpectedly 4 x 50 ft     [facility] Adult PT Treatment:                                                DATE: 11/28/21 Therapeutic Exercise: MMT Standing red band rows 10x2 Standing red band ext 10x 2 - c/o increasing posterior knee pain in standing  Nustep L 5 UE/LE x 5 minutes  Bridge 10 x 2  Side clam AROM 10 x 2  STS 10 x 2   Neuromuscular re-ed: Tandem stance 45 sec each without UE or LOB   [facility] Adult PT Treatment:                                                DATE: 11/21/21 Therapeutic Exercise: Nu step L6 x1mn UE/LE during subjective Seated marches with 2# ankle weights B, 2x12 cues for upright posture and pacing Seated swiss ball press down 2x10 Heel raises at counter x8 B UE support, CGA Seated hamstring stretch 2x30 B LE cues for setup  Neuromuscular re-ed: Seated  adductor isos 2x12 for lumbopelvic stability Airex mini lunge x8 B CGA for safety Retro walking along counter 2x4 laps w SPC and CGA       PATIENT EDUCATION:  Education details: rationale for interventions, PT impairments and POC Person educated: Patient Education method: Explanation and Verbal cues Education comprehension: verbalized understanding   HOME EXERCISE PROGRAM: Access Code: [email or web address] URL: [email or web address] Date: 11/30/2021 Prepared by: Starr Lake  Exercises - Seated Gluteal Sets  - 1 x daily - 7 x weekly - 3 sets - 10 reps - 5 seconds hold - Seated Long Arc Quad  - 1 x daily - 7 x weekly - 3 sets - 10 reps - 2 seconds  hold - Seated March  - 1 x daily - 7 x weekly - 3 sets - 10 reps - Seated Hip Abduction with Resistance  - 1 x daily - 7 x weekly - 2 sets - 10 reps - Seated Heel Toe Raises  - 1 x daily - 7 x weekly - 3 sets - 10 reps - Sit to Stand  - 1 x daily - 7 x weekly - 2 sets - 10 reps - Sit to Stand with Resistance Around Legs  - 1 x daily - 7 x weekly - 2 sets - 10 reps - Standing Hip Abduction with Counter Support  - 1 x daily - 7 x weekly - 2 sets - 10 reps - Standing March with Counter Support  - 1 x daily - 7 x weekly - 2 sets - 10 reps - Standing Hip Extension with Counter Support  - 1 x daily - 7 x weekly - 2 sets - 10 reps  ASSESSMENT:  CLINICAL IMPRESSION: Mr Isenhower arrives to session reporting consistency with his HEP and notes he is seeing some improvement. Additionally today he reports no pain in the knees. Continued working on LE strengthening progressing to standing with UE support for safety. He did well with balance stepping over various heights and unexpected changes in speed / direction.   OBJECTIVE IMPAIRMENTS decreased activity tolerance, decreased balance, decreased endurance, decreased strength, improper body mechanics, postural dysfunction, and pain.   ACTIVITY LIMITATIONS carrying, lifting, bending, sitting, standing, squatting, and stairs  PARTICIPATION LIMITATIONS: cleaning, laundry, shopping, and community activity  PERSONAL FACTORS Age and 3+ comorbidities: Falls, anxiety, depression, hx of Cx  are also affecting patient's functional outcome.   REHAB POTENTIAL: Good  CLINICAL DECISION MAKING: Evolving/moderate complexity  EVALUATION COMPLEXITY: Moderate   GOALS: Goals reviewed with patient? Yes  SHORT TERM GOALS: Target date: 12/05/2021  Pt to be IND with initial HEP for therapeutic progression Baseline: Goal status: INITIAL  2.  Increase LE strength by >/= 1 MMT grade to promote strengthening Baseline:  Goal status: INITIAL  3.  Improve Berg balance  score by >/= 6 points to promote balance and stability  Baseline:  Goal status: INITIAL   LONG TERM GOALS: Target date: 01/02/2022   Increase bil LE strength to >/= 4/5 to promote strength to maximize stability Baseline:  Goal status: INITIAL  2.  Pt to be able to walk/ stand for >/= 45 min and navigate up/ down >/= 12 steps with LRAD with </= 5/10 pain with no report of buckling or report of instability  Baseline:  Goal status: INITIAL  3.  Pt to increase Berg balance to >/= 45 to demo improvement in balance and safety and reduce falls Baseline:  Goal status: INITIAL  4.  Improve FOTO to >/= 42 to  demo improvement in function Baseline:  Goal status: INITIAL  5. Pt to repot no falls in the last 4 weeks and </=min report of instability and falling  Baseline:  Goal status: INITIAL  6.  Pt to be IND with all HEP and will be able to maintain and progress current LOF IND Baseline:  Goal status: INITIAL    PLAN: PT FREQUENCY: 1-2x/week  PT DURATION: 8 weeks  PLANNED INTERVENTIONS: Therapeutic exercises, Therapeutic activity, Neuromuscular re-education, Balance training, Gait training, Patient/Family education, Self Care, Joint mobilization, Stair training, Cryotherapy, Moist heat, Manual therapy, and Re-evaluation  PLAN FOR NEXT SESSION: Review and update HEP PRN. Continue gross LE strengthening, postural stability training as able/appropriate, check STGs   Maurica Omura PT, DPT, LAT, ATC  11/30/21  11:39 AM        PHYSICAL THERAPY DISCHARGE SUMMARY  Visits from Start of Care: 5  Current functional level related to goals / functional outcomes: See goals   Remaining deficits: Current status unknown due to multiple cancellations and missed appointments.   Education / Equipment: HEP   Patient agrees to discharge. Patient goals were not met. Patient is being discharged due to not returning since the last visit.    Marly Schuld PT, DPT, LAT, ATC   12/20/21  1:02 PM

## 2021-12-05 ENCOUNTER — Telehealth: Payer: Self-pay | Admitting: Student

## 2021-12-05 NOTE — Telephone Encounter (Signed)
Patient dropped off form at front desk for CAP/DA.  Verified that patient section of form has been completed.  Last DOS/WCC with PCP was 10/23/21.  Placed form in blue team folder to be completed by clinical staff.  Joel Donaldson

## 2021-12-05 NOTE — Telephone Encounter (Signed)
Clinical info completed on CAP form.  Given to PCP while in clinic today for completion.    When form is completed, please route note to "RN Team" and place in wall pocket in front office.   Salvatore Marvel, CMA

## 2021-12-06 ENCOUNTER — Ambulatory Visit: Payer: Medicare Other | Admitting: Physical Therapy

## 2021-12-07 ENCOUNTER — Ambulatory Visit: Payer: Medicare Other | Admitting: Physical Therapy

## 2021-12-11 NOTE — Telephone Encounter (Signed)
Patient presents to clinic to check status of paperwork. Please advise.  Talbot Grumbling, RN

## 2021-12-12 NOTE — Therapy (Incomplete)
OUTPATIENT PHYSICAL THERAPY LOWER EXTREMITY / BALANCE   Patient Name: Joel Donaldson MRN: [id number] DOB:Nov 16, 1940, 81 y.o., male Today's Date: 12/12/2021        Past Medical History:  Diagnosis Date   Alcohol abuse    Anxiety    Arthritis    hands, back, knees   Asthma    Depression    GERD (gastroesophageal reflux disease)    Glaucoma    Heart murmur asymptomatic   Hx of radioisotope therapy 07/26/11   Radiosactive Prostate Seed Implant/ I-125 Seeds   Hypertension    Neck pain 10/22/2019   Prostate cancer ([place]) dx'd 2016 or 2017   seed implants   Rheumatoid arthritis ([place]) 10/10/2012   Past Surgical History:  Procedure Laterality Date   CYSTOSCOPY  09/14/2011   Procedure: CYSTOSCOPY FLEXIBLE;  Surgeon: Hanley Ben, MD;  Location: [facility];  Service: Urology;  Laterality: N/A;  No seeds seen in the bladder   LEFT HEART CATH AND CORONARY ANGIOGRAPHY N/A 09/16/2020   Procedure: LEFT HEART CATH AND CORONARY ANGIOGRAPHY;  Surgeon: Leonie Man, MD;  Location: [facility];  Service: Cardiovascular;  Laterality: N/A;   [place] IMPLANT  09/14/2011   Procedure: RADIOACTIVE SEED IMPLANT;  Surgeon: Hanley Ben, MD;  Location: [facility];  Service: Urology;  Laterality: N/A;  Total number of seeds -     SHOULDER SURGERY  YRS AGO   LEFT   Patient Active Problem List   Diagnosis Date Noted   Deficit in activities of daily living (ADL) 11/21/2021   Fall 10/23/2021   Acute nonintractable headache 05/31/2021   Chest pain 01/17/2021   Age-related cataract of left eye 01/10/2021   Cataract 01/10/2021   Abnormal cardiac CT angiography 09/16/2020   Epigastric pain 05/20/2020   Atherosclerosis of aorta ([place]) 05/20/2020   Tachycardia    Opioid dependence ([place]) 11/29/2017   Essential hypertension    Chest pain at rest 10/04/2016   Baker's cyst of knee, right 06/21/2016   Right leg swelling 06/14/2016    Back pain 09/19/2015   Unspecified vitamin D deficiency 07/07/2013   GERD (gastroesophageal reflux disease) 07/03/2013   Rheumatoid arthritis ([place]) 10/10/2012   Osteoarthritis of both shoulders 02/15/2012   Tendinopathy of rotator cuff 01/20/2012   Atypical angina ([facility]) (Class III) 12/19/2011   Preventative health care 12/03/2011   Osteoarthritis of both knees 07/02/2011   Alcohol use 05/01/2010   INTENTION TREMOR 09/09/2008   Prostate cancer ([place]) 07/01/2008   Hyperlipidemia 06/24/2007   COPD (chronic obstructive pulmonary disease) ([place]) 05/12/2007   GASTROESOPHAGEAL REFLUX, NO ESOPHAGITIS 04/11/2006    PCP: Gerrit Heck, MD  REFERRING PROVIDER: Lind Covert, MD  REFERRING DIAG: Fall, subsequent encounter   THERAPY DIAG:  No diagnosis found.  Rationale for Evaluation and Treatment Rehabilitation  ONSET DATE: June 2023  SUBJECTIVE:   SUBJECTIVE STATEMENT: *** DO FOTO  *** Pt arrives to session noting he has been doing his exercises and overall reports he feels he is doing a little better with both knee pain and with his balance. " I have no pain today"  PERTINENT HISTORY: Hx of anxiety, depression, RA, Prostate Cx, Glaucoma  PAIN:  Are you having pain? Yes: NPRS scale: 0/10 Pain location: bil knees and shoulders Pain description: acing sore Aggravating factors: unsure, walking/ standing, stairs, lifting Relieving factors: pain medication,   PRECAUTIONS: Fall  WEIGHT BEARING RESTRICTIONS No  FALLS:  Has patient fallen  in last 6 months? Yes. Number of falls 3  LIVING ENVIRONMENT: Lives with: lives with their family Lives in: House/apartment Stairs: No Has following equipment at home: Single point cane, Environmental consultant - 2 wheeled, and shower chair  OCCUPATION: Disability   PLOF: Independent with basic ADLs  PATIENT GOALS get back to where he was before this.   OBJECTIVE:   DIAGNOSTIC FINDINGS:  10/14/2021 CT Head WO Contrast IMPRESSION: 1. No  acute traumatic injury identified. 2. Stable non contrast CT appearance of the brain with mild to moderate for age white matter changes.  10/14/2021 R shoulder X-ray IMPRESSION: 1. No fracture or acute finding. 2. Marked narrowing of the subacromial space consistent with a chronic full-thickness rotator cuff tear.  10/14/2021 L Wrist X-ray IMPRESSION: 1. No acute fracture or dislocation. 2. Widened scapholunate interval consistent with chronic scapholunate ligament disruption. 3. Degenerative changes as detailed. Probable old ulnar styloid fracture.  PATIENT SURVEYS:  FOTO 31% predicted 42%  COGNITION:  Overall cognitive status: Within functional limits for tasks assessed     SENSATION: WFL  POSTURE: rounded shoulders, forward head, and bil genu valgum  PALPATION: Gross knee pain (pt unable to pinpoint a specific area of pain). R>L   LOWER EXTREMITY ROM:  Active ROM Right eval Left eval Right  11/28/21 Left 11/28/21  Hip flexion 4-/5P! 4/5 P! 4+ 4+  Hip extension 3/5 3/5    Hip abduction 3-/5 3-/5    Hip adduction 3/5 3/5    Hip internal rotation      Hip external rotation      Knee flexion 3/5 P! 3/5 P! 4 4  Knee extension 3+/5 P! 4-/5 P! 4 P! 4 P!  Ankle dorsiflexion      Ankle plantarflexion      Ankle inversion      Ankle eversion       (Blank rows = not tested)  LOWER EXTREMITY MMT:  MMT Right eval Left eval  Hip flexion [facility]  Hip extension [facility]  Hip abduction    Hip adduction    Hip internal rotation    Hip external rotation    Knee flexion [facility] WFL  Knee extension [facility]  Ankle dorsiflexion [facility] WFL  Ankle plantarflexion    Ankle inversion    Ankle eversion     (Blank rows = not tested)   FUNCTIONAL TESTS:  5 times sit to stand: 36 with the use of bil UE Berg Balance Scale: 29/56 BERG BALANCE TEST Sitting to Standing: 3.      Stands independently using hands Standing Unsupported: 3.      Stands 2 minutes with supervision Sitting  Unsupported: 4.     Sits for 2 minutes independently Standing to Sitting: 2.     Uses back of legs against chair to control descent  Transfers: 3.     Transfers safely definite use of hands Standing with eyes closed: 2.     Able to stand for 3 seconds Standing with feet together: 3.     Stands for 1 minute with supervision Reaching forward with outstretched arm: 2.     Reaches forward 2 inches Retrieving object from the floor: 3.     Able to pick up with supervision Turning to look behind: 1.     Needs supervision when turning Turning 360 degrees: 1.     Needs supervision or verbal cueing Place alternate foot on stool: 0.     Unable, needs assist to keep from falling  Standing with one foot in front: 1.     Needs help to step, but can hold for 15 seconds Standing on one foot: 1.     Holds <3 seconds  Total Score: 29/56   GAIT: Distance walked: 125 ft Assistive device utilized: Single point cane Level of assistance: Modified independence Comments: bil genu valgum, decreased step length/ stride bil    TODAY'S TREATMENT: [facility] Adult PT Treatment:                                                DATE: 12/13/21 Therapeutic Exercise: *** Manual Therapy: *** Neuromuscular re-ed: *** Therapeutic Activity: *** Modalities: *** Self Care: ***   [facility] Adult PT Treatment:                                                DATE: 11/30/2021 Therapeutic Exercise: Nustep L 5 UE/LE x 5 minutes - verbal cues to keep the knees from touching during exercise Seated hamstring stretch 2x30 B LE verbal cues for setup STS with band around knees to engage glue med 2 x 12 with RTB - intermittent verbal cues for to keep knees apart LAQ with ball between knees to reduce valgus 2 x 12 with 3# Standing hip abduction 2 x 10 with bil UE on counter Standing hip extension 2 x 10 with bil UE on counter Standing alternating L/R marching 2 x 10 with bil UE on counter  -Updated HEP for standing hip strengthening.   Neuromuscular re-ed: Obstacle course stepping over 1/2 bolster, then 1 hurdle and standing on airex pad x 30 sec x 5 Walking with changes in speed/ stopping unexpectedly 4 x 50 ft     [facility] Adult PT Treatment:                                                DATE: 11/28/21 Therapeutic Exercise: MMT Standing red band rows 10x2 Standing red band ext 10x 2 - c/o increasing posterior knee pain in standing  Nustep L 5 UE/LE x 5 minutes  Bridge 10 x 2  Side clam AROM 10 x 2  STS 10 x 2   Neuromuscular re-ed: Tandem stance 45 sec each without UE or LOB       PATIENT EDUCATION:  Education details: rationale for interventions, PT impairments and POC Person educated: Patient Education method: Explanation and Verbal cues Education comprehension: verbalized understanding   HOME EXERCISE PROGRAM: Access Code: [email or web address] URL: [email or web address] Date: 11/30/2021 Prepared by: Starr Lake  Exercises - Seated Gluteal Sets  - 1 x daily - 7 x weekly - 3 sets - 10 reps - 5 seconds hold - Seated Long Arc Quad  - 1 x daily - 7 x weekly - 3 sets - 10 reps - 2 seconds hold - Seated March  - 1 x daily - 7 x weekly - 3 sets - 10 reps - Seated Hip Abduction with Resistance  - 1 x daily - 7 x weekly - 2 sets - 10 reps - Seated Heel Toe Raises  - 1 x daily - 7 x  weekly - 3 sets - 10 reps - Sit to Stand  - 1 x daily - 7 x weekly - 2 sets - 10 reps - Sit to Stand with Resistance Around Legs  - 1 x daily - 7 x weekly - 2 sets - 10 reps - Standing Hip Abduction with Counter Support  - 1 x daily - 7 x weekly - 2 sets - 10 reps - Standing March with Counter Support  - 1 x daily - 7 x weekly - 2 sets - 10 reps - Standing Hip Extension with Counter Support  - 1 x daily - 7 x weekly - 2 sets - 10 reps  ASSESSMENT:  CLINICAL IMPRESSION: ***  *** Mr Kohles arrives to session reporting consistency with his HEP and notes he is seeing some improvement. Additionally today he reports no  pain in the knees. Continued working on LE strengthening progressing to standing with UE support for safety. He did well with balance stepping over various heights and unexpected changes in speed / direction.   OBJECTIVE IMPAIRMENTS decreased activity tolerance, decreased balance, decreased endurance, decreased strength, improper body mechanics, postural dysfunction, and pain.   ACTIVITY LIMITATIONS carrying, lifting, bending, sitting, standing, squatting, and stairs  PARTICIPATION LIMITATIONS: cleaning, laundry, shopping, and community activity  PERSONAL FACTORS Age and 3+ comorbidities: Falls, anxiety, depression, hx of Cx  are also affecting patient's functional outcome.   REHAB POTENTIAL: Good  CLINICAL DECISION MAKING: Evolving/moderate complexity  EVALUATION COMPLEXITY: Moderate   GOALS: Goals reviewed with patient? Yes  SHORT TERM GOALS: Target date: 12/05/2021 *** Pt to be IND with initial HEP for therapeutic progression Baseline: Goal status: INITIAL  2.  Increase LE strength by >/= 1 MMT grade to promote strengthening Baseline:  Goal status: INITIAL  3.  Improve Berg balance score by >/= 6 points to promote balance and stability  Baseline:  Goal status: INITIAL   LONG TERM GOALS: Target date: 01/02/2022   Increase bil LE strength to >/= 4/5 to promote strength to maximize stability Baseline:  Goal status: INITIAL  2.  Pt to be able to walk/ stand for >/= 45 min and navigate up/ down >/= 12 steps with LRAD with </= 5/10 pain with no report of buckling or report of instability  Baseline:  Goal status: INITIAL  3.  Pt to increase Berg balance to >/= 45 to demo improvement in balance and safety and reduce falls Baseline:  Goal status: INITIAL  4.  Improve FOTO to >/= 42 to demo improvement in function Baseline:  Goal status: INITIAL  5. Pt to repot no falls in the last 4 weeks and </=min report of instability and falling  Baseline:  Goal status:  INITIAL  6.  Pt to be IND with all HEP and will be able to maintain and progress current LOF IND Baseline:  Goal status: INITIAL    PLAN: PT FREQUENCY: 1-2x/week  PT DURATION: 8 weeks  PLANNED INTERVENTIONS: Therapeutic exercises, Therapeutic activity, Neuromuscular re-education, Balance training, Gait training, Patient/Family education, Self Care, Joint mobilization, Stair training, Cryotherapy, Moist heat, Manual therapy, and Re-evaluation  PLAN FOR NEXT SESSION: *** Review and update HEP PRN. Continue gross LE strengthening, postural stability training as able/appropriate, check STGs   Leeroy Cha PT, DPT 12/12/2021 2:46 PM

## 2021-12-13 ENCOUNTER — Ambulatory Visit: Payer: Medicare Other | Attending: Family Medicine | Admitting: Physical Therapy

## 2021-12-13 ENCOUNTER — Telehealth: Payer: Self-pay | Admitting: Physical Therapy

## 2021-12-13 NOTE — Telephone Encounter (Signed)
Called pt at preferred number re: today's missed appt - he answers and states there was an appt conflict. Confirms tomorrow's appt

## 2021-12-14 ENCOUNTER — Telehealth: Payer: Self-pay | Admitting: Physical Therapy

## 2021-12-14 ENCOUNTER — Ambulatory Visit: Payer: Medicare Other | Admitting: Physical Therapy

## 2021-12-14 NOTE — Telephone Encounter (Signed)
Patient returned call to front office. Advised that paperwork has been completed. Copy made and placed in batch scanning. Original at front desk for pick up.   Faxed [employer] forms to [phone]. Copy made and placed in batch scanning.   Talbot Grumbling, RN

## 2021-12-14 NOTE — Telephone Encounter (Signed)
Attempted to contact patient regarding his second no-show this week. No answer, voice mail left with details about attendance policy and next appointment date and time. He was asked to call the clinic if he no longer needs PT services.

## 2021-12-18 ENCOUNTER — Ambulatory Visit: Payer: Self-pay | Admitting: Licensed Clinical Social Worker

## 2021-12-18 NOTE — Patient Outreach (Signed)
  Care Coordination   12/18/2021 Name: Joel Donaldson MRN: [id number] DOB: Jul 14, 1940   Care Coordination Outreach Attempts:  An unsuccessful telephone outreach was attempted today to offer the patient information about available care coordination services as a benefit of their health plan.   Follow Up Plan:  No further outreach attempts will be made at this time. We have been unable to contact the patient to offer or enroll patient in care coordination services  Encounter Outcome:  Pt. Visit Completed  Care Coordination Interventions Activated:  Yes   Care Coordination Interventions:  Yes, provided    Lenor Derrick , MSW, [place] Social Worker [facility] Care Management  [phone]

## 2021-12-20 ENCOUNTER — Ambulatory Visit: Payer: Medicare Other | Admitting: Physical Therapy

## 2021-12-20 ENCOUNTER — Telehealth: Payer: Self-pay | Admitting: Physical Therapy

## 2021-12-20 ENCOUNTER — Other Ambulatory Visit: Payer: Self-pay | Admitting: Cardiovascular Disease

## 2021-12-20 NOTE — Therapy (Incomplete)
OUTPATIENT PHYSICAL THERAPY LOWER EXTREMITY / BALANCE   Patient Name: Joel Donaldson MRN: [id number] DOB:1940-07-28, 81 y.o., male Today's Date: 12/20/2021        Past Medical History:  Diagnosis Date   Alcohol abuse    Anxiety    Arthritis    hands, back, knees   Asthma    Depression    GERD (gastroesophageal reflux disease)    Glaucoma    Heart murmur asymptomatic   Hx of radioisotope therapy 07/26/11   Radiosactive Prostate Seed Implant/ I-125 Seeds   Hypertension    Neck pain 10/22/2019   Prostate cancer ([place]) dx'd 2016 or 2017   seed implants   Rheumatoid arthritis ([place]) 10/10/2012   Past Surgical History:  Procedure Laterality Date   CYSTOSCOPY  09/14/2011   Procedure: CYSTOSCOPY FLEXIBLE;  Surgeon: Hanley Ben, MD;  Location: [facility];  Service: Urology;  Laterality: N/A;  No seeds seen in the bladder   LEFT HEART CATH AND CORONARY ANGIOGRAPHY N/A 09/16/2020   Procedure: LEFT HEART CATH AND CORONARY ANGIOGRAPHY;  Surgeon: Leonie Man, MD;  Location: [facility];  Service: Cardiovascular;  Laterality: N/A;   [place] IMPLANT  09/14/2011   Procedure: RADIOACTIVE SEED IMPLANT;  Surgeon: Hanley Ben, MD;  Location: [facility];  Service: Urology;  Laterality: N/A;  Total number of seeds -     SHOULDER SURGERY  YRS AGO   LEFT   Patient Active Problem List   Diagnosis Date Noted   Deficit in activities of daily living (ADL) 11/21/2021   Fall 10/23/2021   Acute nonintractable headache 05/31/2021   Chest pain 01/17/2021   Age-related cataract of left eye 01/10/2021   Cataract 01/10/2021   Abnormal cardiac CT angiography 09/16/2020   Epigastric pain 05/20/2020   Atherosclerosis of aorta ([place]) 05/20/2020   Tachycardia    Opioid dependence ([place]) 11/29/2017   Essential hypertension    Chest pain at rest 10/04/2016   Baker's cyst of knee, right 06/21/2016   Right leg swelling 06/14/2016    Back pain 09/19/2015   Unspecified vitamin D deficiency 07/07/2013   GERD (gastroesophageal reflux disease) 07/03/2013   Rheumatoid arthritis ([place]) 10/10/2012   Osteoarthritis of both shoulders 02/15/2012   Tendinopathy of rotator cuff 01/20/2012   Atypical angina (HCC) (Class III) 12/19/2011   Preventative health care 12/03/2011   Osteoarthritis of both knees 07/02/2011   Alcohol use 05/01/2010   INTENTION TREMOR 09/09/2008   Prostate cancer ([place]) 07/01/2008   Hyperlipidemia 06/24/2007   COPD (chronic obstructive pulmonary disease) ([place]) 05/12/2007   GASTROESOPHAGEAL REFLUX, NO ESOPHAGITIS 04/11/2006    PCP: Gerrit Heck, MD  REFERRING PROVIDER: Lind Covert, MD  REFERRING DIAG: Fall, subsequent encounter   THERAPY DIAG:  No diagnosis found.  Rationale for Evaluation and Treatment Rehabilitation  ONSET DATE: June 2023  SUBJECTIVE:   SUBJECTIVE STATEMENT: ***  PERTINENT HISTORY: Hx of anxiety, depression, RA, Prostate Cx, Glaucoma  PAIN:  Are you having pain? Yes: NPRS scale: 0/10 Pain location: bil knees and shoulders Pain description: acing sore Aggravating factors: unsure, walking/ standing, stairs, lifting Relieving factors: pain medication,   PRECAUTIONS: Fall  WEIGHT BEARING RESTRICTIONS No  FALLS:  Has patient fallen in last 6 months? Yes. Number of falls 3  LIVING ENVIRONMENT: Lives with: lives with their family Lives in: House/apartment Stairs: No Has following equipment at home: Single point cane, Environmental consultant - 2 wheeled, and shower chair  OCCUPATION:  Disability   PLOF: Independent with basic ADLs  PATIENT GOALS get back to where he was before this.   OBJECTIVE:   DIAGNOSTIC FINDINGS:  10/14/2021 CT Head WO Contrast IMPRESSION: 1. No acute traumatic injury identified. 2. Stable non contrast CT appearance of the brain with mild to moderate for age white matter changes.  10/14/2021 R shoulder X-ray IMPRESSION: 1. No fracture  or acute finding. 2. Marked narrowing of the subacromial space consistent with a chronic full-thickness rotator cuff tear.  10/14/2021 L Wrist X-ray IMPRESSION: 1. No acute fracture or dislocation. 2. Widened scapholunate interval consistent with chronic scapholunate ligament disruption. 3. Degenerative changes as detailed. Probable old ulnar styloid fracture.  PATIENT SURVEYS:  FOTO 31% predicted 42%  COGNITION:  Overall cognitive status: Within functional limits for tasks assessed     SENSATION: WFL  POSTURE: rounded shoulders, forward head, and bil genu valgum  PALPATION: Gross knee pain (pt unable to pinpoint a specific area of pain). R>L   LOWER EXTREMITY ROM:  Active ROM Right eval Left eval Right  11/28/21 Left 11/28/21  Hip flexion 4-/5P! 4/5 P! 4+ 4+  Hip extension 3/5 3/5    Hip abduction 3-/5 3-/5    Hip adduction 3/5 3/5    Hip internal rotation      Hip external rotation      Knee flexion 3/5 P! 3/5 P! 4 4  Knee extension 3+/5 P! 4-/5 P! 4 P! 4 P!  Ankle dorsiflexion      Ankle plantarflexion      Ankle inversion      Ankle eversion       (Blank rows = not tested)  LOWER EXTREMITY MMT:  MMT Right eval Left eval  Hip flexion [facility]  Hip extension [name]  Hip abduction    Hip adduction    Hip internal rotation    Hip external rotation    Knee flexion [facility] WFL  Knee extension [facility]  Ankle dorsiflexion [facility] WFL  Ankle plantarflexion    Ankle inversion    Ankle eversion     (Blank rows = not tested)   FUNCTIONAL TESTS:  5 times sit to stand: 36 with the use of bil UE Berg Balance Scale: 29/56 BERG BALANCE TEST Sitting to Standing: 3.      Stands independently using hands Standing Unsupported: 3.      Stands 2 minutes with supervision Sitting Unsupported: 4.     Sits for 2 minutes independently Standing to Sitting: 2.     Uses back of legs against chair to control descent  Transfers: 3.     Transfers safely definite use of  hands Standing with eyes closed: 2.     Able to stand for 3 seconds Standing with feet together: 3.     Stands for 1 minute with supervision Reaching forward with outstretched arm: 2.     Reaches forward 2 inches Retrieving object from the floor: 3.     Able to pick up with supervision Turning to look behind: 1.     Needs supervision when turning Turning 360 degrees: 1.     Needs supervision or verbal cueing Place alternate foot on stool: 0.     Unable, needs assist to keep from falling Standing with one foot in front: 1.     Needs help to step, but can hold for 15 seconds Standing on one foot: 1.     Holds <3 seconds  Total Score: 29/56   GAIT:  Distance walked: 125 ft Assistive device utilized: Single point cane Level of assistance: Modified independence Comments: bil genu valgum, decreased step length/ stride bil    TODAY'S TREATMENT:  [facility] Adult PT Treatment:                                                DATE: 12/20/2021 Therapeutic Exercise: *** Manual Therapy: *** Neuromuscular re-ed: *** Therapeutic Activity: *** Modalities: *** Self Care: ***   [facility] Adult PT Treatment:                                                DATE: 11/30/2021 Therapeutic Exercise: Nustep L 5 UE/LE x 5 minutes - verbal cues to keep the knees from touching during exercise Seated hamstring stretch 2x30 B LE verbal cues for setup STS with band around knees to engage glue med 2 x 12 with RTB - intermittent verbal cues for to keep knees apart LAQ with ball between knees to reduce valgus 2 x 12 with 3# Standing hip abduction 2 x 10 with bil UE on counter Standing hip extension 2 x 10 with bil UE on counter Standing alternating L/R marching 2 x 10 with bil UE on counter  -Updated HEP for standing hip strengthening.  Neuromuscular re-ed: Obstacle course stepping over 1/2 bolster, then 1 hurdle and standing on airex pad x 30 sec x 5 Walking with changes in speed/ stopping unexpectedly 4 x 50 ft    [facility] Adult PT Treatment:                                                DATE: 11/28/21 Therapeutic Exercise: MMT Standing red band rows 10x2 Standing red band ext 10x 2 - c/o increasing posterior knee pain in standing  Nustep L 5 UE/LE x 5 minutes  Bridge 10 x 2  Side clam AROM 10 x 2  STS 10 x 2   Neuromuscular re-ed: Tandem stance 45 sec each without UE or LOB   PATIENT EDUCATION:  Education details: rationale for interventions, PT impairments and POC Person educated: Patient Education method: Explanation and Verbal cues Education comprehension: verbalized understanding   HOME EXERCISE PROGRAM: Access Code: [email or web address] URL: [email or web address] Date: 11/30/2021 Prepared by: Starr Lake  Exercises - Seated Gluteal Sets  - 1 x daily - 7 x weekly - 3 sets - 10 reps - 5 seconds hold - Seated Long Arc Quad  - 1 x daily - 7 x weekly - 3 sets - 10 reps - 2 seconds hold - Seated March  - 1 x daily - 7 x weekly - 3 sets - 10 reps - Seated Hip Abduction with Resistance  - 1 x daily - 7 x weekly - 2 sets - 10 reps - Seated Heel Toe Raises  - 1 x daily - 7 x weekly - 3 sets - 10 reps - Sit to Stand  - 1 x daily - 7 x weekly - 2 sets - 10 reps - Sit to Stand with Resistance Around Legs  - 1 x daily - 7 x weekly - 2  sets - 10 reps - Standing Hip Abduction with Counter Support  - 1 x daily - 7 x weekly - 2 sets - 10 reps - Standing March with Counter Support  - 1 x daily - 7 x weekly - 2 sets - 10 reps - Standing Hip Extension with Counter Support  - 1 x daily - 7 x weekly - 2 sets - 10 reps  ASSESSMENT:  CLINICAL IMPRESSION: ***   OBJECTIVE IMPAIRMENTS decreased activity tolerance, decreased balance, decreased endurance, decreased strength, improper body mechanics, postural dysfunction, and pain.   ACTIVITY LIMITATIONS carrying, lifting, bending, sitting, standing, squatting, and stairs  PARTICIPATION LIMITATIONS: cleaning, laundry, shopping, and community  activity  PERSONAL FACTORS Age and 3+ comorbidities: Falls, anxiety, depression, hx of Cx  are also affecting patient's functional outcome.   REHAB POTENTIAL: Good  CLINICAL DECISION MAKING: Evolving/moderate complexity  EVALUATION COMPLEXITY: Moderate   GOALS: Goals reviewed with patient? Yes  SHORT TERM GOALS: Target date: 12/05/2021  Pt to be IND with initial HEP for therapeutic progression Baseline: Goal status: INITIAL  2.  Increase LE strength by >/= 1 MMT grade to promote strengthening Baseline:  Goal status: INITIAL  3.  Improve Berg balance score by >/= 6 points to promote balance and stability  Baseline:  Goal status: INITIAL   LONG TERM GOALS: Target date: 01/02/2022   Increase bil LE strength to >/= 4/5 to promote strength to maximize stability Baseline:  Goal status: INITIAL  2.  Pt to be able to walk/ stand for >/= 45 min and navigate up/ down >/= 12 steps with LRAD with </= 5/10 pain with no report of buckling or report of instability  Baseline:  Goal status: INITIAL  3.  Pt to increase Berg balance to >/= 45 to demo improvement in balance and safety and reduce falls Baseline:  Goal status: INITIAL  4.  Improve FOTO to >/= 42 to demo improvement in function Baseline:  Goal status: INITIAL  5. Pt to repot no falls in the last 4 weeks and </=min report of instability and falling  Baseline:  Goal status: INITIAL  6.  Pt to be IND with all HEP and will be able to maintain and progress current LOF IND Baseline:  Goal status: INITIAL    PLAN: PT FREQUENCY: 1-2x/week  PT DURATION: 8 weeks  PLANNED INTERVENTIONS: Therapeutic exercises, Therapeutic activity, Neuromuscular re-education, Balance training, Gait training, Patient/Family education, Self Care, Joint mobilization, Stair training, Cryotherapy, Moist heat, Manual therapy, and Re-evaluation  PLAN FOR NEXT SESSION: Review and update HEP PRN. Continue gross LE strengthening, postural stability  training as able/appropriate, check STGs   Lemoine Goyne PT, DPT, LAT, ATC  12/20/21  7:52 AM

## 2021-12-20 NOTE — Telephone Encounter (Signed)
Called patient to check on him based on his missed appointment today at 1015.  Also it appears increased frequency of cancellations and today marking his third missed appointment that there are multiple barriers impacting his ability to attend physical therapy.  He expressed agreement based on medical issues and that he had multiple appointments with other providers.  I discussed discharging from physical therapy today and provide him more opportunity to address his other medical issues, and once they have been resolved or addressed to talk to his referring provider and get an updated prescription to start back with physical therapy.  [name] expressed agreement and agreed to discharge today.  Abdulloh Ullom PT, DPT, LAT, ATC  12/20/21  1:00 PM

## 2021-12-21 ENCOUNTER — Ambulatory Visit: Payer: Medicare Other | Admitting: Physical Therapy

## 2021-12-21 ENCOUNTER — Other Ambulatory Visit: Payer: Self-pay | Admitting: Cardiovascular Disease

## 2021-12-22 ENCOUNTER — Other Ambulatory Visit: Payer: Self-pay | Admitting: Cardiovascular Disease

## 2021-12-26 ENCOUNTER — Ambulatory Visit: Payer: Medicare Other | Admitting: Physical Therapy

## 2021-12-28 ENCOUNTER — Ambulatory Visit: Payer: Medicare Other | Admitting: Physical Therapy

## 2021-12-28 DIAGNOSIS — Z131 Encounter for screening for diabetes mellitus: Secondary | ICD-10-CM | POA: Diagnosis not present

## 2021-12-28 DIAGNOSIS — M25562 Pain in left knee: Secondary | ICD-10-CM | POA: Diagnosis not present

## 2021-12-28 DIAGNOSIS — M25561 Pain in right knee: Secondary | ICD-10-CM | POA: Diagnosis not present

## 2021-12-28 DIAGNOSIS — Z79899 Other long term (current) drug therapy: Secondary | ICD-10-CM | POA: Diagnosis not present

## 2021-12-28 DIAGNOSIS — G8929 Other chronic pain: Secondary | ICD-10-CM | POA: Diagnosis not present

## 2021-12-28 DIAGNOSIS — M545 Low back pain, unspecified: Secondary | ICD-10-CM | POA: Diagnosis not present

## 2021-12-28 DIAGNOSIS — Z9181 History of falling: Secondary | ICD-10-CM | POA: Diagnosis not present

## 2021-12-28 DIAGNOSIS — E559 Vitamin D deficiency, unspecified: Secondary | ICD-10-CM | POA: Diagnosis not present

## 2021-12-28 DIAGNOSIS — G894 Chronic pain syndrome: Secondary | ICD-10-CM | POA: Diagnosis not present

## 2021-12-31 DIAGNOSIS — Z79899 Other long term (current) drug therapy: Secondary | ICD-10-CM | POA: Diagnosis not present

## 2022-01-15 ENCOUNTER — Other Ambulatory Visit: Payer: Self-pay

## 2022-01-15 DIAGNOSIS — I10 Essential (primary) hypertension: Secondary | ICD-10-CM

## 2022-01-15 MED ORDER — LISINOPRIL 20 MG PO TABS: ORAL_TABLET | ORAL | 1 refills | Status: DC

## 2022-01-24 ENCOUNTER — Other Ambulatory Visit: Payer: Self-pay | Admitting: Cardiovascular Disease

## 2022-01-25 DIAGNOSIS — Z79899 Other long term (current) drug therapy: Secondary | ICD-10-CM | POA: Diagnosis not present

## 2022-01-25 DIAGNOSIS — G894 Chronic pain syndrome: Secondary | ICD-10-CM | POA: Diagnosis not present

## 2022-01-25 DIAGNOSIS — E559 Vitamin D deficiency, unspecified: Secondary | ICD-10-CM | POA: Diagnosis not present

## 2022-01-25 DIAGNOSIS — M25561 Pain in right knee: Secondary | ICD-10-CM | POA: Diagnosis not present

## 2022-01-25 DIAGNOSIS — G8929 Other chronic pain: Secondary | ICD-10-CM | POA: Diagnosis not present

## 2022-01-25 DIAGNOSIS — M25562 Pain in left knee: Secondary | ICD-10-CM | POA: Diagnosis not present

## 2022-01-25 DIAGNOSIS — Z9181 History of falling: Secondary | ICD-10-CM | POA: Diagnosis not present

## 2022-01-25 DIAGNOSIS — M545 Low back pain, unspecified: Secondary | ICD-10-CM | POA: Diagnosis not present

## 2022-01-29 DIAGNOSIS — Z79899 Other long term (current) drug therapy: Secondary | ICD-10-CM | POA: Diagnosis not present

## 2022-02-06 ENCOUNTER — Other Ambulatory Visit: Payer: Self-pay | Admitting: Student

## 2022-02-06 DIAGNOSIS — J449 Chronic obstructive pulmonary disease, unspecified: Secondary | ICD-10-CM

## 2022-02-19 ENCOUNTER — Other Ambulatory Visit: Payer: Self-pay

## 2022-02-19 DIAGNOSIS — J449 Chronic obstructive pulmonary disease, unspecified: Secondary | ICD-10-CM

## 2022-02-19 MED ORDER — ALBUTEROL SULFATE HFA 108 (90 BASE) MCG/ACT IN AERS: INHALATION_SPRAY | RESPIRATORY_TRACT | 3 refills | Status: DC

## 2022-02-22 DIAGNOSIS — E559 Vitamin D deficiency, unspecified: Secondary | ICD-10-CM | POA: Diagnosis not present

## 2022-02-22 DIAGNOSIS — G894 Chronic pain syndrome: Secondary | ICD-10-CM | POA: Diagnosis not present

## 2022-02-22 DIAGNOSIS — M25562 Pain in left knee: Secondary | ICD-10-CM | POA: Diagnosis not present

## 2022-02-22 DIAGNOSIS — Z79899 Other long term (current) drug therapy: Secondary | ICD-10-CM | POA: Diagnosis not present

## 2022-02-22 DIAGNOSIS — M545 Low back pain, unspecified: Secondary | ICD-10-CM | POA: Diagnosis not present

## 2022-02-22 DIAGNOSIS — Z9181 History of falling: Secondary | ICD-10-CM | POA: Diagnosis not present

## 2022-02-22 DIAGNOSIS — M25561 Pain in right knee: Secondary | ICD-10-CM | POA: Diagnosis not present

## 2022-02-22 DIAGNOSIS — M129 Arthropathy, unspecified: Secondary | ICD-10-CM | POA: Diagnosis not present

## 2022-02-22 DIAGNOSIS — Z0000 Encounter for general adult medical examination without abnormal findings: Secondary | ICD-10-CM | POA: Diagnosis not present

## 2022-02-26 DIAGNOSIS — Z79899 Other long term (current) drug therapy: Secondary | ICD-10-CM | POA: Diagnosis not present

## 2022-03-08 ENCOUNTER — Encounter: Payer: Self-pay | Admitting: Family Medicine

## 2022-03-08 ENCOUNTER — Ambulatory Visit (HOSPITAL_COMMUNITY): Payer: 59

## 2022-03-08 ENCOUNTER — Ambulatory Visit (HOSPITAL_COMMUNITY): Payer: 59 | Attending: Nurse Practitioner | Admitting: Nurse Practitioner

## 2022-03-08 ENCOUNTER — Ambulatory Visit (INDEPENDENT_AMBULATORY_CARE_PROVIDER_SITE_OTHER): Payer: 59

## 2022-03-08 ENCOUNTER — Ambulatory Visit (INDEPENDENT_AMBULATORY_CARE_PROVIDER_SITE_OTHER): Payer: 59 | Admitting: Family Medicine

## 2022-03-08 ENCOUNTER — Telehealth: Payer: Self-pay

## 2022-03-08 ENCOUNTER — Encounter: Payer: Self-pay | Admitting: Nurse Practitioner

## 2022-03-08 VITALS — BP 104/64 | HR 77 | Ht 70.0 in | Wt 183.4 lb

## 2022-03-08 VITALS — BP 108/62 | HR 83 | Ht 70.0 in | Wt 182.6 lb

## 2022-03-08 DIAGNOSIS — I25118 Atherosclerotic heart disease of native coronary artery with other forms of angina pectoris: Secondary | ICD-10-CM

## 2022-03-08 DIAGNOSIS — J42 Unspecified chronic bronchitis: Secondary | ICD-10-CM | POA: Diagnosis not present

## 2022-03-08 DIAGNOSIS — R079 Chest pain, unspecified: Secondary | ICD-10-CM

## 2022-03-08 DIAGNOSIS — I10 Essential (primary) hypertension: Secondary | ICD-10-CM | POA: Diagnosis not present

## 2022-03-08 DIAGNOSIS — E785 Hyperlipidemia, unspecified: Secondary | ICD-10-CM | POA: Diagnosis not present

## 2022-03-08 DIAGNOSIS — I491 Atrial premature depolarization: Secondary | ICD-10-CM

## 2022-03-08 MED ORDER — AMLODIPINE BESYLATE 5 MG PO TABS: 5.0000 mg | ORAL_TABLET | Freq: Every day | ORAL | 3 refills | Status: DC

## 2022-03-08 NOTE — Telephone Encounter (Signed)
Patient called front office requesting to schedule appointment for chest pain and SHOB.   Patient transferred to nurse line. Reports that since last night he has been having mild SHOB with exertion. Hx of COPD.  Also reports that he has been having mild, intermittent mid sternal chest pain since last night. Describes pain as dull. Denies radiating pain, jaw pain or nausea.   Denies current pain. Took aspirin this AM. Right now he feels comfortable with normal breathing.  Scheduled for follow up this AM. Provided with ED precautions.   Talbot Grumbling, RN

## 2022-03-08 NOTE — Progress Notes (Signed)
    SUBJECTIVE:   CHIEF COMPLAINT / HPI:   Chest pain Joel Donaldson presents today with his grand-daughter for follow up of chest pain. He reports sudden onset last night, non-radiating pain in center of chest associated with SOB. Denies crushing quality, boring through to back, pre-syncope or syncope. The chest pain and SOB was persistent last night but had resolved by the time he woke up this morning.   He does have history of of atypical angina, but he believes this episode feels different than his previous anginal episodes.   PERTINENT  PMH / PSH: HTN, atypical angina, aortic atherosclerosis, COPD, GERD, prostate cancer, HLD, intention tremor  OBJECTIVE:   BP 108/62   Pulse 83   Ht '5\' 10"'$  (1.778 m)   Wt 182 lb 9.6 oz (82.8 kg)   SpO2 97%   BMI 26.20 kg/m    Physical Exam General: Awake, alert, oriented Cardiovascular: Regular rate and rhythm, S1 and S2 present, no murmurs auscultated Respiratory: Lung fields clear to auscultation bilaterally  ASSESSMENT/PLAN:   Chest pain Chest pain today concerning for atypical angina vs new onset arrhythmia. Follows with cardiologist Dr. Sanda Klein at [facility]. EKG today concerning for frequent PACs (question A. Fib?). Called cardiology office, they are able to work him in this afternoon at the [facility] location. Patient informed of appointment. Recommended he continue his aspirin daily. Would consider metoprolol but given prior intolerance and rapid cardiology follow up in several hours, will defer BB to cardiology.     Ezequiel Essex, MD [place]

## 2022-03-08 NOTE — Patient Instructions (Addendum)
It was wonderful to see you today. Thank you for allowing me to be a part of your care. Below is a short summary of what we discussed at your visit today:  Chest pain and shortness of breath I wonder if the abnormal heart rhythm we see on the EKG today started when you began having the symptoms last night.   I have called your cardiologist to get you a follow up appointment. Please go to the cardiology office:  This afternoon at 1:55 pm TODAY At [address] office: [name] [phone]  START aspirin 81 mg daily.   Your cardiology provider will tell you if you need to start back on the metoprolol.   Reasons to go to the hospital: Chest pain happens again Symptoms get worse You feel dizzy or light headed with these symptoms   Please bring all of your medications to every appointment!  If you have any questions or concerns, please do not hesitate to contact us via phone or MyChart message.   Ezequiel Essex, MD

## 2022-03-08 NOTE — Progress Notes (Unsigned)
Applied a 7 day Zio XT monitor to patient in the in office  Dr Sallyanne Kuster to read

## 2022-03-08 NOTE — Assessment & Plan Note (Signed)
Chest pain today concerning for atypical angina vs new onset arrhythmia. Follows with cardiologist Dr. Sanda Klein at [facility]. EKG today concerning for frequent PACs (question A. Fib?). Called cardiology office, they are able to work him in this afternoon at the [facility] location. Patient informed of appointment. Recommended he continue his aspirin daily. Would consider metoprolol but given prior intolerance and rapid cardiology follow up in several hours, will defer BB to cardiology.

## 2022-03-08 NOTE — Progress Notes (Signed)
Office Visit    Patient Name: Joel Donaldson Date of Encounter: 03/08/2022  Primary Care Provider:  Gerrit Heck, MD Primary Cardiologist:  Sanda Klein, MD Primary Electrophysiologist: None  Chief Complaint    Joel Donaldson is a 82 y.o. male with PMH of HTN, GERD, former tobacco use, rheumatoid arthritis, prostate CA s/p radioactive seed implant, and COPD who presents today for complaint of shortness of breath with mild midsternal chest pain.  Past Medical History    Past Medical History:  Diagnosis Date   Alcohol abuse    Anxiety    Arthritis    hands, back, knees   Asthma    Depression    GERD (gastroesophageal reflux disease)    Glaucoma    Heart murmur asymptomatic   Hx of radioisotope therapy 07/26/11   Radiosactive Prostate Seed Implant/ I-125 Seeds   Hypertension    Neck pain 10/22/2019   Prostate cancer ([place]) dx'd 2016 or 2017   seed implants   Rheumatoid arthritis ([place]) 10/10/2012   Past Surgical History:  Procedure Laterality Date   CYSTOSCOPY  09/14/2011   Procedure: CYSTOSCOPY FLEXIBLE;  Surgeon: Hanley Ben, MD;  Location: [facility];  Service: Urology;  Laterality: N/A;  No seeds seen in the bladder   LEFT HEART CATH AND CORONARY ANGIOGRAPHY N/A 09/16/2020   Procedure: LEFT HEART CATH AND CORONARY ANGIOGRAPHY;  Surgeon: Leonie Man, MD;  Location: [facility];  Service: Cardiovascular;  Laterality: N/A;   [place] IMPLANT  09/14/2011   Procedure: RADIOACTIVE SEED IMPLANT;  Surgeon: Hanley Ben, MD;  Location: [facility];  Service: Urology;  Laterality: N/A;  Total number of seeds -     SHOULDER SURGERY  YRS AGO   LEFT    Allergies  Allergies  Allergen Reactions   Tramadol Nausea Only   Pollen Extract Other (See Comments)    Sneezing    History of Present Illness    Joel Donaldson  is a 82 year old male with the above mention past medical history who  presents today for complaint of chest pain shortness of breath.  Joel Donaldson was seen today at his PCP with complaint of chest pain.  EKG was obtained that showed sinus with PACs with no acute abnormalities.  He was started on ASA 81 mg and directed to follow-up with his cardiologist with ED precautions advised as well.  His disability is followed by Dr.Croitoru since 2022 when he was seen for complaint of chest pain and dyspnea. Coronary CT obtained on 09/05/2020 showed coronary calcium score of 1523 which placed the patient in 90th percentile for age and sex matched control, greater than 70% plaque in distal left circumflex and mild 25 to 49% plaque in RCA, LAD and OM1. Despite the severe plaque in the distal left circumflex, FFR was actually normal suggestive of nonobstructive CAD. Due to persistent symptoms, he ultimately underwent cardiac catheterization on 09/16/2020 which revealed 60% LPA V lesion, 30% distal left main to ostial LAD lesion. No significant coronary artery disease noted. Although cardiac catheterization report mentioned suspect microvascular disease.  He was seen in follow-up by Dr. Sallyanne Kuster 18/24/22 with continued exertional chest pain and metoprolol was increased to 50 mg daily with p.m. dose of 25 mg as needed for elevated BP.  He was seen by Almyra Deforest, PA on 12/07/2020 following a mechanical fall reported no exertional chest pain at that time.  He  was seen in the ED 01/18/2021 by Dr. Claiborne Billings for complaint of chest pain that occurred after walking to his mailbox which was associated with shortness of breath and hot feeling.  He took 2 nitroglycerin with resolution of pain.  He called EMS and blood pressure was found to be 210/110 on arrival.  He was ruled out for ACS with negative troponin and no acute changes on EKG.  Amlodipine and Imdur were added with metoprolol reduced to 50 mg daily down from 75 mg.  He was last seen by Dr. Sallyanne Kuster on 01/27/2021 for follow-up of the visit.  No medication  changes made at that time but plan was to add long-acting nitroglycerin or increase amlodipine if his symptoms recur.  Joel Donaldson presents today for complaint of chest pain and shortness of breath with his niece.  Since last being seen in the office patient reports that he is experienced midsternal chest pain and shortness of breath since yesterday evening.  His blood pressure today is well-controlled at 104/64 and heart rate was 77 bpm.  Discussing his medications he reports that he has not taken his amlodipine for 2-3 weeks.  He notes that he had no chest pain discomfort while taking amlodipine and reports that he has not picked up his refill recently.  During our meeting his niece reported that he has been under a lot of pressure and worries a lot.  He reports that when he is wearing his chest discomfort occurs.  He denies any adverse reactions to his current medication regimen.  Patient denies palpitations, dyspnea, PND, orthopnea, nausea, vomiting, dizziness, syncope, edema, weight gain, or early satiety.   Home Medications    Current Outpatient Medications  Medication Sig Dispense Refill   acetaminophen (TYLENOL) 500 MG tablet Take 2 tablets (1,000 mg total) by mouth every 6 (six) hours as needed. 30 tablet 0   albuterol (VENTOLIN HFA) 108 (90 Base) MCG/ACT inhaler INHALE 1 TO 2 PUFFS INTO THE LUNGS EVERY 4 HOURS AS NEEDED FOR WHEEZING OR SHORTNESS OF BREATH 8.5 g 3   amLODipine (NORVASC) 5 MG tablet Take 1 tablet (5 mg total) by mouth daily. 90 tablet 3   aspirin EC 81 MG tablet Take 81 mg by mouth every morning.      atorvastatin (LIPITOR) 20 MG tablet TAKE 1 TABLET(20 MG) BY MOUTH DAILY 30 tablet 1   bacitracin ointment Apply 1 Application topically 2 (two) times daily. 120 g 0   buprenorphine (BUTRANS) 7.5 MCG/HR Place 1 patch onto the skin every Thursday.     carbamide peroxide (DEBROX) 6.5 % OTIC solution Place 5 drops into the left ear 2 (two) times daily. 15 mL 0   diclofenac Sodium  (VOLTAREN) 1 % GEL Apply 4 g topically 4 (four) times daily. 350 g 1   fluticasone (FLONASE) 50 MCG/ACT nasal spray SHAKE LIQUID AND USE 2 SPRAYS IN EACH NOSTRIL DAILY 16 g 3   INCRUSE ELLIPTA 62.5 MCG/ACT AEPB INHALE 1 PUFF INTO THE LUNGS DAILY 30 each 2   lidocaine (XYLOCAINE) 5 % ointment Apply 1 application topically daily as needed for mild pain or moderate pain. 35.44 g 1   lisinopril (ZESTRIL) 20 MG tablet TAKE 2 TABLETS('40MG'$ ) BY MOUTH DAILY 180 tablet 1   Multiple Vitamins-Minerals (ONE-A-DAY PROACTIVE 65+) TABS Take 1 tablet by mouth daily with breakfast.     nitroGLYCERIN (NITROSTAT) 0.4 MG SL tablet Place 1 tablet (0.4 mg total) under the tongue every 5 (five) minutes as needed for chest pain (  chest pain). 20 tablet 0   omeprazole (PRILOSEC) 40 MG capsule TAKE 1 CAPSULE(40 MG) BY MOUTH DAILY BEFORE BREAKFAST 90 capsule 2   polyethylene glycol (MIRALAX / GLYCOLAX) 17 g packet Take 17 g by mouth daily as needed for mild constipation. 14 each 0   No current facility-administered medications for this visit.     Review of Systems  Please see the history of present illness.    (+) Chest discomfort (+) Shortness of breath, anxiety  All other systems reviewed and are otherwise negative except as noted above.  Physical Exam    Wt Readings from Last 3 Encounters:  03/08/22 182 lb 9.6 oz (82.8 kg)  11/21/21 183 lb 9.6 oz (83.3 kg)  10/23/21 183 lb 12.8 oz (83.4 kg)   JI:RCVEL were no vitals filed for this visit.,There is no height or weight on file to calculate BMI.  Constitutional:      Appearance: Healthy appearance. Not in distress.  Neck:     Vascular: JVD normal.  Pulmonary:     Effort: Pulmonary effort is normal.     Breath sounds: No wheezing. No rales. Diminished in the bases Cardiovascular:     Normal rate. Regular rhythm. Normal S1. Normal S2.      Murmurs: There is no murmur.  Edema:    Peripheral edema absent.  Abdominal:     Palpations: Abdomen is soft non  tender. There is no hepatomegaly.  Skin:    General: Skin is warm and dry.  Neurological:     General: No focal deficit present.     Mental Status: Alert and oriented to person, place and time.     Cranial Nerves: Cranial nerves are intact.  EKG/LABS/Other Studies Reviewed    ECG personally reviewed by me today -none completed today  Lab Results  Component Value Date   WBC 4.4 09/01/2021   HGB 13.9 09/01/2021   HCT 42.5 09/01/2021   MCV 85.2 09/01/2021   PLT 245 09/01/2021   Lab Results  Component Value Date   CREATININE 0.83 09/01/2021   BUN 10 09/01/2021   NA 137 09/01/2021   K 3.9 09/01/2021   CL 105 09/01/2021   CO2 24 09/01/2021   Lab Results  Component Value Date   ALT 16 09/01/2021   AST 16 09/01/2021   ALKPHOS 72 09/01/2021   BILITOT 0.8 09/01/2021   Lab Results  Component Value Date   CHOL 145 01/18/2021   HDL 46 01/18/2021   LDLCALC 88 01/18/2021   LDLDIRECT 99 06/24/2007   TRIG 53 01/18/2021   CHOLHDL 3.2 01/18/2021    Lab Results  Component Value Date   HGBA1C 5.6 05/21/2020    Assessment & Plan    1.  Chest pain: -Patient reports onset of chest pain that occurred last night was mild and substernal in nature. -Today patient reports that his chest pain occurs with physical activity and emotional distress.  He describes the chest pain as mild substernal pain that resolves spontaneously. -He has stopped his amlodipine for the past 2 to 3 weeks and reports that his chest discomfort occurred around that same time. -We will restart amlodipine 5 mg daily  2.  Coronary artery disease: -s/p coronary CTA obtained 08/2020 with calcium score of 1523 LHC performed 8/5/2022which revealed 60% LPA V lesion, 30% distal left main to ostial LAD lesion.  -EKG was completed today that showed sinus rhythm with PACs. -We will have him complete a 14-day ZIO monitor -Continue GDMT with  atorvastatin 20 mg aspirin 81 mg  3.  Essential hypertension: -Patient's blood  pressure today was 104/64 -Continue amlodipine 5 mg daily, lisinopril 40 mg daily  4.  Hyperlipidemia: -Patient's last LDL cholesterol was 88 -Continue Lipitor 40 mg daily  5.  History of COPD: -Patient currently followed by pulmonology  Disposition: Follow-up with Sanda Klein, MD or APP in 2 months   Medication Adjustments/Labs and Tests Ordered: Current medicines are reviewed at length with the patient today.  Concerns regarding medicines are outlined above.   Signed, Mable Fill, Marissa Nestle, NP 03/08/2022, 12:32 PM [facility]  Note:  This document was prepared using Dragon voice recognition software and may include unintentional dictation errors.

## 2022-03-08 NOTE — Patient Instructions (Signed)
Medication Instructions:  RESTART Norvasc '5mg'$  Take 1 tablet once a day  *If you need a refill on your cardiac medications before your next appointment, please call your pharmacy*   Lab Work: None ordered   Testing/Procedures: ZIO XT- Long Term Monitor Instructions  Your physician has requested you wear a ZIO patch monitor for 14 days.  This is a single patch monitor. [employer] supplies one patch monitor per enrollment. Additional stickers are not available. Please do not apply patch if you will be having a Nuclear Stress Test,  Echocardiogram, Cardiac CT, MRI, or Chest Xray during the period you would be wearing the  monitor. The patch cannot be worn during these tests. You cannot remove and re-apply the  ZIO XT patch monitor.  Your ZIO patch monitor will be mailed 3 day USPS to your address on file. It may take 3-5 days  to receive your monitor after you have been enrolled.  Once you have received your monitor, please review the enclosed instructions. Your monitor  has already been registered assigning a specific monitor serial # to you.  Billing and Patient Assistance Program Information  We have supplied [employer] with any of your insurance information on file for billing purposes. [employer] offers a sliding scale Patient Assistance Program for patients that do not have  insurance, or whose insurance does not completely cover the cost of the ZIO monitor.  You must apply for the Patient Assistance Program to qualify for this discounted rate.  To apply, please call [employer] at [phone], select option 4, select option 2, ask to apply for  Patient Assistance Program. Theodore Demark will ask your household income, and how many people  are in your household. They will quote your out-of-pocket cost based on that information.  [employer] will also be able to set up a 83-month interest-free payment plan if needed.  Applying the monitor   Shave hair from upper left chest.  Hold abrader disc by  orange tab. Rub abrader in 40 strokes over the upper left chest as  indicated in your monitor instructions.  Clean area with 4 enclosed alcohol pads. Let dry.  Apply patch as indicated in monitor instructions. Patch will be placed under collarbone on left  side of chest with arrow pointing upward.  Rub patch adhesive wings for 2 minutes. Remove white label marked "1". Remove the white  label marked "2". Rub patch adhesive wings for 2 additional minutes.  While looking in a mirror, press and release button in center of patch. A small green light will  flash 3-4 times. This will be your only indicator that the monitor has been turned on.  Do not shower for the first 24 hours. You may shower after the first 24 hours.  Press the button if you feel a symptom. You will hear a small click. Record Date, Time and  Symptom in the Patient Logbook.  When you are ready to remove the patch, follow instructions on the last 2 pages of Patient  Logbook. Stick patch monitor onto the last page of Patient Logbook.  Place Patient Logbook in the blue and white box. Use locking tab on box and tape box closed  securely. The blue and white box has prepaid postage on it. Please place it in the mailbox as  soon as possible. Your physician should have your test results approximately 7 days after the  monitor has been [facility]  Call IE[employer] [phone] you have questions regarding  your ZIO XT patch monitor. Call them immediately if you see an orange light blinking on your  monitor.  If your monitor falls off in less than 4 days, contact our Monitor department at [phone].  If your monitor becomes loose or falls off after 4 days call [employer] at [phone] for  suggestions on securing your monitor    Follow-Up: At [facility], you and your health needs are our priority.  As part of our continuing mission to provide you with exceptional heart care, we  have created designated Provider Care Teams.  These Care Teams include your primary Cardiologist (physician) and Advanced Practice Providers (APPs -  Physician Assistants and Nurse Practitioners) who all work together to provide you with the care you need, when you need it.  We recommend signing up for the patient portal called "MyChart".  Sign up information is provided on this After Visit Summary.  MyChart is used to connect with patients for Virtual Visits (Telemedicine).  Patients are able to view lab/test results, encounter notes, upcoming appointments, etc.  Non-urgent messages can be sent to your provider as well.   To learn more about what you can do with MyChart, go to [email or web address].    Your next appointment:   2 month(s)  Provider:   Ambrose Pancoast, NP       Other Instructions

## 2022-03-09 ENCOUNTER — Telehealth: Payer: Self-pay

## 2022-03-09 NOTE — Telephone Encounter (Signed)
Patient called in stating the medication he was prescribed was supposed to be a new med. Called patient back and patient stated he was mistaken and his daughter helped clear up the issue.

## 2022-03-22 DIAGNOSIS — M25561 Pain in right knee: Secondary | ICD-10-CM | POA: Diagnosis not present

## 2022-03-22 DIAGNOSIS — E559 Vitamin D deficiency, unspecified: Secondary | ICD-10-CM | POA: Diagnosis not present

## 2022-03-22 DIAGNOSIS — Z9181 History of falling: Secondary | ICD-10-CM | POA: Diagnosis not present

## 2022-03-22 DIAGNOSIS — M25562 Pain in left knee: Secondary | ICD-10-CM | POA: Diagnosis not present

## 2022-03-22 DIAGNOSIS — G8929 Other chronic pain: Secondary | ICD-10-CM | POA: Diagnosis not present

## 2022-03-22 DIAGNOSIS — G894 Chronic pain syndrome: Secondary | ICD-10-CM | POA: Diagnosis not present

## 2022-03-22 DIAGNOSIS — Z79899 Other long term (current) drug therapy: Secondary | ICD-10-CM | POA: Diagnosis not present

## 2022-03-22 DIAGNOSIS — M545 Low back pain, unspecified: Secondary | ICD-10-CM | POA: Diagnosis not present

## 2022-03-23 DIAGNOSIS — I10 Essential (primary) hypertension: Secondary | ICD-10-CM | POA: Diagnosis not present

## 2022-03-23 DIAGNOSIS — R079 Chest pain, unspecified: Secondary | ICD-10-CM | POA: Diagnosis not present

## 2022-03-27 DIAGNOSIS — Z79899 Other long term (current) drug therapy: Secondary | ICD-10-CM | POA: Diagnosis not present

## 2022-03-28 ENCOUNTER — Other Ambulatory Visit: Payer: Self-pay | Admitting: Cardiovascular Disease

## 2022-04-16 ENCOUNTER — Encounter: Payer: Self-pay | Admitting: Family Medicine

## 2022-04-16 ENCOUNTER — Ambulatory Visit (INDEPENDENT_AMBULATORY_CARE_PROVIDER_SITE_OTHER): Payer: 59 | Admitting: Family Medicine

## 2022-04-16 VITALS — BP 138/84 | HR 86 | Ht 70.0 in | Wt 181.0 lb

## 2022-04-16 DIAGNOSIS — I10 Essential (primary) hypertension: Secondary | ICD-10-CM | POA: Diagnosis not present

## 2022-04-16 DIAGNOSIS — E7800 Pure hypercholesterolemia, unspecified: Secondary | ICD-10-CM | POA: Diagnosis not present

## 2022-04-16 DIAGNOSIS — R251 Tremor, unspecified: Secondary | ICD-10-CM | POA: Diagnosis not present

## 2022-04-16 NOTE — Progress Notes (Signed)
    SUBJECTIVE:   CHIEF COMPLAINT / HPI:   Shaking episode - Patient reports that he had an episode of shaking that lasted a few seconds yesterday and could not speak during the episode - Is unclear if it was his whole body or specific body parts that were shaking - Remember the entire episode and it frightened him - Has not had any illness symptoms recently - Denies chest pain, shortness of breath, focal or generalized weakness, change in tremors, difficulty with speaking, unexpected weight loss or weight gain, extreme fatigue - Was concerned that it could be related to his blood pressure as at home it was 148/90, the patient was asymptomatic at the time - Has not had any further shaking episodes - Has been compliant with all of his medications - Reports baseline tremor in his hands that has not worsened  PERTINENT  PMH / PSH: COPD, HTN, atypical angina,  OBJECTIVE:   BP 138/84   Pulse 86   Ht '5\' 10"'$  (1.778 m)   Wt 181 lb (82.1 kg)   SpO2 99%   BMI 25.97 kg/m   Gen: well-appearing, NAD CV: RRR, no m/r/g appreciated, no peripheral edema Pulm: CTAB, no wheezes/crackles  Neuro: CN II: PERRL CN III, IV,VI: EOMI CV V: Normal sensation in V1, V2, V3 CVII: Symmetric smile and brow raise CN VIII: Normal hearing CN IX,X: Symmetric palate raise  CN XI: 5/5 shoulder shrug CN XII: Symmetric tongue protrusion  UE and LE strength 5/5 Normal sensation in UE and LE bilaterally  Slow shuffling gait with valgus knees Intention tremor in bilateral hands  ASSESSMENT/PLAN:   Episode of Shaking Patient experienced uncontrollable episode of shaking that lasted for several seconds with associated inability to talk due to the shaking.  Reassuringly had does not have any new neurologic deficits and has not had any recurrence of the episode.  Differential is broad and includes TIA, hypoglycemia, focal seizure, electrolyte disturbance, hypotension.  Patient is reassuringly already on  antiplatelet therapy and statin, is not currently on medications with hypoglycemic risk, and blood pressure is reassuring.  Will obtain restratification labs to determine if any obvious abnormality that could be corrected and explain the symptoms.  Discussed with patient that pending the results of the labs and patient's level of concern, we could consider evaluation with neurology. - Lipid panel, A1c, TSH, CMP, CBC - Consider neurology evaluation - Recommended patient not drive himself until evaluation is completed - Strict return and ER precautions discussed  HTN BP mildly elevated today 138/84.  Compliant with amlodipine and lisinopril.  Consideration that episode of shaking described above could be related to hypotension, but at this point seems to be less likely given the short duration and the subsequent resolution. - Closely monitor blood pressure - Continue manage medications as prescribed - CMP today  Hyperlipidemia Is compliant with statin medication.  Last lipid panel > 1 year ago - Lipid panel today  Rise Patience, [place]

## 2022-04-16 NOTE — Patient Instructions (Signed)
We are going to get labs today to just check and see if we can find a reason that may have caused her shaking episode.  Once the results come back, we can determine if you want to follow-up with neurology or if we have other workup that we want to do here in the clinic.  If you have further episodes of the symptoms, I would recommend calling or getting evaluated in the ER right away.  It would be best if you are able to have someone drive you while we are doing this evaluation just so that one of these episodes does not happen while you are driving.   If you start to have weakness in any part of your body, difficulty with talking or getting her words out, further episodes of allover body shaking them please get evaluated immediately.

## 2022-04-17 ENCOUNTER — Encounter: Payer: Self-pay | Admitting: Family Medicine

## 2022-04-17 DIAGNOSIS — Z86010 Personal history of colonic polyps: Secondary | ICD-10-CM | POA: Insufficient documentation

## 2022-04-17 LAB — COMPREHENSIVE METABOLIC PANEL
ALT: 12 IU/L (ref 0–44)
AST: 16 IU/L (ref 0–40)
Albumin/Globulin Ratio: 1.8 (ref 1.2–2.2)
Albumin: 4.2 g/dL (ref 3.7–4.7)
Alkaline Phosphatase: 69 IU/L (ref 44–121)
BUN/Creatinine Ratio: 14 (ref 10–24)
BUN: 13 mg/dL (ref 8–27)
Bilirubin Total: 0.6 mg/dL (ref 0.0–1.2)
CO2: 20 mmol/L (ref 20–29)
Calcium: 9.3 mg/dL (ref 8.6–10.2)
Chloride: 102 mmol/L (ref 96–106)
Creatinine, Ser: 0.9 mg/dL (ref 0.76–1.27)
Globulin, Total: 2.4 g/dL (ref 1.5–4.5)
Glucose: 87 mg/dL (ref 70–99)
Potassium: 4.3 mmol/L (ref 3.5–5.2)
Sodium: 138 mmol/L (ref 134–144)
Total Protein: 6.6 g/dL (ref 6.0–8.5)
eGFR: 85 mL/min/{1.73_m2} (ref >59)

## 2022-04-17 LAB — HEMOGLOBIN A1C
Est. average glucose Bld gHb Est-mCnc: 120 mg/dL
Hgb A1c MFr Bld: 5.8 % — ABNORMAL HIGH (ref 4.8–5.6)

## 2022-04-17 LAB — CBC
Hematocrit: 38.4 % (ref 37.5–51.0)
Hemoglobin: 12.6 g/dL — ABNORMAL LOW (ref 13.0–17.7)
MCH: 29.1 pg (ref 26.6–33.0)
MCHC: 32.8 g/dL (ref 31.5–35.7)
MCV: 89 fL (ref 79–97)
Platelets: 187 10*3/uL (ref 150–450)
RBC: 4.33 x10E6/uL (ref 4.14–5.80)
RDW: 13.7 % (ref 11.6–15.4)
WBC: 3.8 10*3/uL (ref 3.4–10.8)

## 2022-04-17 LAB — LIPID PANEL
Chol/HDL Ratio: 2.1 ratio (ref 0.0–5.0)
Cholesterol, Total: 120 mg/dL (ref 100–199)
HDL: 57 mg/dL (ref >39)
LDL Chol Calc (NIH): 49 mg/dL (ref 0–99)
Triglycerides: 65 mg/dL (ref 0–149)
VLDL Cholesterol Cal: 14 mg/dL (ref 5–40)

## 2022-04-17 LAB — TSH RFX ON ABNORMAL TO FREE T4: TSH: 1.5 u[IU]/mL (ref 0.450–4.500)

## 2022-04-18 ENCOUNTER — Telehealth: Payer: Self-pay | Admitting: Family Medicine

## 2022-04-18 NOTE — Telephone Encounter (Signed)
Patient called regarding the results from prior visit. Only abnormalities are mildly elevated A1c into the prediabetic range and and slightly low Hgb at 12.6. Nothing that would hint towards a cause of the shaking episode. Patient reassuringly states he has not had any further episodes.   Do not think that further evaluation is warranted at this time unless it occurs again, then may also need to consider evaluation with neurology.    Ivette Castronova, DO

## 2022-04-19 DIAGNOSIS — M545 Low back pain, unspecified: Secondary | ICD-10-CM | POA: Diagnosis not present

## 2022-04-19 DIAGNOSIS — Z79899 Other long term (current) drug therapy: Secondary | ICD-10-CM | POA: Diagnosis not present

## 2022-04-19 DIAGNOSIS — G8929 Other chronic pain: Secondary | ICD-10-CM | POA: Diagnosis not present

## 2022-04-19 DIAGNOSIS — Z9181 History of falling: Secondary | ICD-10-CM | POA: Diagnosis not present

## 2022-04-19 DIAGNOSIS — M25561 Pain in right knee: Secondary | ICD-10-CM | POA: Diagnosis not present

## 2022-04-19 DIAGNOSIS — M25562 Pain in left knee: Secondary | ICD-10-CM | POA: Diagnosis not present

## 2022-04-19 DIAGNOSIS — G894 Chronic pain syndrome: Secondary | ICD-10-CM | POA: Diagnosis not present

## 2022-04-27 ENCOUNTER — Ambulatory Visit (INDEPENDENT_AMBULATORY_CARE_PROVIDER_SITE_OTHER): Payer: 59 | Admitting: Podiatry

## 2022-04-27 DIAGNOSIS — M79675 Pain in left toe(s): Secondary | ICD-10-CM | POA: Diagnosis not present

## 2022-04-27 DIAGNOSIS — B351 Tinea unguium: Secondary | ICD-10-CM | POA: Diagnosis not present

## 2022-04-27 DIAGNOSIS — M79674 Pain in right toe(s): Secondary | ICD-10-CM | POA: Diagnosis not present

## 2022-04-27 NOTE — Progress Notes (Signed)
   Chief Complaint  Patient presents with   Nail Problem    Nail trim     SUBJECTIVE Patient presents to office today complaining of elongated, thickened nails that cause pain while ambulating in shoes.  Patient is unable to trim their own nails. Patient is here for further evaluation and treatment.  Past Medical History:  Diagnosis Date   Acute nonintractable headache 05/31/2021   Alcohol abuse    Anxiety    Arthritis    hands, back, knees   Asthma    Atypical angina ([place]) (Class III) 12/19/2011   Baker's cyst of knee, right 06/21/2016   Chest pain at rest 10/04/2016   Depression    Epigastric pain 05/20/2020   Fall 10/23/2021   GERD (gastroesophageal reflux disease)    Glaucoma    Heart murmur asymptomatic   Hx of radioisotope therapy 07/26/2011   Radiosactive Prostate Seed Implant/ I-125 Seeds   Hypertension    Neck pain 10/22/2019   Prostate cancer ([place]) dx'd 2016 or 2017   seed implants   Rheumatoid arthritis ([place]) 10/10/2012    Allergies  Allergen Reactions   Tramadol Nausea Only   Pollen Extract Other (See Comments)    Sneezing     OBJECTIVE General Patient is awake, alert, and oriented x 3 and in no acute distress. Derm Skin is dry and supple bilateral. Negative open lesions or macerations. Remaining integument unremarkable. Nails are tender, long, thickened and dystrophic with subungual debris, consistent with onychomycosis, 1-5 bilateral. No signs of infection noted. Vasc  DP and PT pedal pulses palpable bilaterally. Temperature gradient within normal limits.  Neuro Epicritic and protective threshold sensation grossly intact bilaterally.  Musculoskeletal Exam No symptomatic pedal deformities noted bilateral. Muscular strength within normal limits.  ASSESSMENT 1.  Pain due to onychomycosis of toenails both  PLAN OF CARE 1. Patient evaluated today.  2. Instructed to maintain good pedal hygiene and foot care.  3. Mechanical debridement of nails 1-5  bilaterally performed using a nail nipper. Filed with dremel without incident.  4. Return to clinic in 3 mos.    Edrick Kins, DPM [facility]  Dr. Edrick Kins, DPM    [address],  [address]                Office [phone]  Fax [phone]

## 2022-05-17 NOTE — Progress Notes (Deleted)
Office Visit    Patient Name: Joel Donaldson Date of Encounter: 05/17/2022  Primary Care Provider:  Gerrit Heck, MD Primary Cardiologist:  Sanda Klein, MD Primary Electrophysiologist: None  Chief Complaint    Joel Donaldson is a 82 y.o. male with PMH of HTN, GERD, former tobacco use, rheumatoid arthritis, prostate CA s/p radioactive seed implant, and COPD who presents today for 71-month follow-up of chest pain and shortness of breath.  Past Medical History    Past Medical History:  Diagnosis Date   Acute nonintractable headache 05/31/2021   Alcohol abuse    Anxiety    Arthritis    hands, back, knees   Asthma    Atypical angina ([place]) (Class III) 12/19/2011   Baker's cyst of knee, right 06/21/2016   Chest pain at rest 10/04/2016   Depression    Epigastric pain 05/20/2020   Fall 10/23/2021   GERD (gastroesophageal reflux disease)    Glaucoma    Heart murmur asymptomatic   Hx of radioisotope therapy 07/26/2011   Radiosactive Prostate Seed Implant/ I-125 Seeds   Hypertension    Neck pain 10/22/2019   Prostate cancer ([place]) dx'd 2016 or 2017   seed implants   Rheumatoid arthritis ([place]) 10/10/2012   Past Surgical History:  Procedure Laterality Date   CYSTOSCOPY  09/14/2011   Procedure: CYSTOSCOPY FLEXIBLE;  Surgeon: Hanley Ben, MD;  Location: [place];  Service: Urology;  Laterality: N/A;  No seeds seen in the bladder   LEFT HEART CATH AND CORONARY ANGIOGRAPHY N/A 09/16/2020   Procedure: LEFT HEART CATH AND CORONARY ANGIOGRAPHY;  Surgeon: Leonie Man, MD;  Location: [place] [facility];  Service: Cardiovascular;  Laterality: N/A;   [place] IMPLANT  09/14/2011   Procedure: RADIOACTIVE SEED IMPLANT;  Surgeon: Hanley Ben, MD;  Location: [place];  Service: Urology;  Laterality: N/A;  Total number of seeds -     SHOULDER SURGERY  YRS AGO   LEFT    Allergies  Allergies  Allergen  Reactions   Tramadol Nausea Only   Pollen Extract Other (See Comments)    Sneezing    History of Present Illness    Joel Donaldson  is a 82 year old male with the above mention past medical history who presents today for 8-month follow-up for complaint of chest pain shortness of breath. He was last seen by Dr. Sallyanne Kuster on 01/27/2021 for follow-up of the visit.  No medication changes made at that time but plan was to add long-acting nitroglycerin or increase amlodipine if his symptoms recur.  He was seen on 03/08/2022 for complaint of midsternal chest pain and shortness of breath.  During his visit his blood pressure was well-controlled at 104/76 and heart rate was 77 bpm.  During visit patient was noncompliant with amlodipine and had not picked up refills.  He also reported being under tremendous amount of stress and worry recently.  He also noted chest discomfort with physical activity.  He was restarted on amlodipine 5 mg for antianginal effect.  Pleated a 14-day ZIO monitor due to PACs obtained on EKG.  Monitor results showed predominantly sinus rhythm with no evidence of atrial fibrillation or pauses.  Since last being seen in the office patient reports***.  Patient denies chest pain, palpitations, dyspnea, PND, orthopnea, nausea, vomiting, dizziness, syncope, edema, weight gain, or early satiety.   ***Notes:  Home Medications    Current Outpatient Medications  Medication Sig Dispense Refill   oxyCODONE-acetaminophen (PERCOCET) 10-325 MG tablet      acetaminophen (TYLENOL) 500 MG tablet Take 2 tablets (1,000 mg total) by mouth every 6 (six) hours as needed. 30 tablet 0   albuterol (VENTOLIN HFA) 108 (90 Base) MCG/ACT inhaler INHALE 1 TO 2 PUFFS INTO THE LUNGS EVERY 4 HOURS AS NEEDED FOR WHEEZING OR SHORTNESS OF BREATH 8.5 g 3   amLODipine (NORVASC) 5 MG tablet Take 1 tablet (5 mg total) by mouth daily. 90 tablet 3   aspirin EC 81 MG tablet Take 81 mg by mouth every morning.       atorvastatin (LIPITOR) 20 MG tablet TAKE 1 TABLET(20 MG) BY MOUTH DAILY 30 tablet 0   bacitracin ointment Apply 1 Application topically 2 (two) times daily. 120 g 0   buprenorphine (BUTRANS) 7.5 MCG/HR Place 1 patch onto the skin every Thursday.     diclofenac Sodium (VOLTAREN) 1 % GEL Apply 4 g topically 4 (four) times daily. 350 g 1   INCRUSE ELLIPTA 62.5 MCG/ACT AEPB INHALE 1 PUFF INTO THE LUNGS DAILY 30 each 2   lidocaine (XYLOCAINE) 5 % ointment Apply 1 application topically daily as needed for mild pain or moderate pain. 35.44 g 1   lisinopril (ZESTRIL) 20 MG tablet TAKE 2 TABLETS(40MG ) BY MOUTH DAILY 180 tablet 1   Multiple Vitamins-Minerals (ONE-A-DAY PROACTIVE 65+) TABS Take 1 tablet by mouth daily with breakfast.     nitroGLYCERIN (NITROSTAT) 0.4 MG SL tablet Place 1 tablet (0.4 mg total) under the tongue every 5 (five) minutes as needed for chest pain (chest pain). 20 tablet 0   polyethylene glycol (MIRALAX / GLYCOLAX) 17 g packet Take 17 g by mouth daily as needed for mild constipation. 14 each 0   No current facility-administered medications for this visit.     Review of Systems  Please see the history of present illness.    (+)*** (+)***  All other systems reviewed and are otherwise negative except as noted above.  Physical Exam    Wt Readings from Last 3 Encounters:  04/16/22 181 lb (82.1 kg)  03/08/22 183 lb 6.4 oz (83.2 kg)  03/08/22 182 lb 9.6 oz (82.8 kg)   [id number] were no vitals filed for this visit.,There is no height or weight on file to calculate BMI.  Constitutional:      Appearance: Healthy appearance. Not in distress.  Neck:     Vascular: JVD normal.  Pulmonary:     Effort: Pulmonary effort is normal.     Breath sounds: No wheezing. No rales. Diminished in the bases Cardiovascular:     Normal rate. Regular rhythm. Normal S1. Normal S2.      Murmurs: There is no murmur.  Edema:    Peripheral edema absent.  Abdominal:     Palpations: Abdomen is soft  non tender. There is no hepatomegaly.  Skin:    General: Skin is warm and dry.  Neurological:     General: No focal deficit present.     Mental Status: Alert and oriented to person, place and time.     Cranial Nerves: Cranial nerves are intact.  EKG/LABS/ Recent Cardiac Studies    ECG personally reviewed by me today - ***  Risk Assessment/Calculations:   {Does this patient have ATRIAL FIBRILLATION?:[phone]}        Lab Results  Component Value Date   WBC 3.8 04/16/2022   HGB 12.6 (L) 04/16/2022   HCT 38.4 04/16/2022   MCV 89  04/16/2022   PLT 187 04/16/2022   Lab Results  Component Value Date   CREATININE 0.90 04/16/2022   BUN 13 04/16/2022   NA 138 04/16/2022   K 4.3 04/16/2022   CL 102 04/16/2022   CO2 20 04/16/2022   Lab Results  Component Value Date   ALT 12 04/16/2022   AST 16 04/16/2022   ALKPHOS 69 04/16/2022   BILITOT 0.6 04/16/2022   Lab Results  Component Value Date   CHOL 120 04/16/2022   HDL 57 04/16/2022   LDLCALC 49 04/16/2022   LDLDIRECT 99 06/24/2007   TRIG 65 04/16/2022   CHOLHDL 2.1 04/16/2022    Lab Results  Component Value Date   HGBA1C 5.8 (H) 04/16/2022    Cardiac Studies & Procedures   CARDIAC CATHETERIZATION  CARDIAC CATHETERIZATION 09/16/2020  Narrative   LPAV lesion is 60% stenosed.   Dist LM to Ost LAD lesion is 30% stenosed.   The left ventricular systolic function is normal.  SUMMARY Mild diffuse multivessel disease with no obvious significant stenosis. Most notable stenosis is in the distal AV groove LCx roughly 60% (not positive by CT FFR). Significant systemic hypertension with normal LV EDP. Normal LV function with no obvious wall motion abnormality Suspect MICROVASCULAR DISEASE - ([place])  [name], MD  Findings Coronary Findings Diagnostic  Dominance: Left  Left Main Vessel is large. Dist LM to Ost LAD lesion is 30% stenosed. The lesion is located at the bifurcation and focal.  Left Anterior  Descending There is mild diffuse disease throughout the vessel.  First Diagonal Branch Vessel is small in size.  First Septal Branch Vessel is small in size.  Second Septal Branch Vessel is small in size.  Left Circumflex Vessel is large. There is mild diffuse disease throughout the vessel.  First Obtuse Marginal Branch Vessel is small in size.  Second Obtuse Marginal Branch Vessel is large in size. The vessel exhibits minimal luminal irregularities.  Left Posterior Atrioventricular Artery LPAV lesion is 60% stenosed. The lesion is located at the bend and focal. CT FFR 0.81  Right Coronary Artery Vessel is small. There is severe diffuse disease throughout the vessel.  Acute Marginal Branch Vessel is small in size.  Right Ventricular Branch Vessel is small in size. There is moderate disease in the vessel.  Intervention  No interventions have been documented.     ECHOCARDIOGRAM  ECHOCARDIOGRAM COMPLETE 06/29/2020  Narrative ECHOCARDIOGRAM REPORT    Patient Name:   SILVIA MCNEILLY Delavega Date of Exam: 06/28/2020 Medical Rec #:  [id number]        Height:       70.0 in Accession #:    [id number]       Weight:       170.0 lb Date of Birth:  [birth year]        BSA:          1.948 m Patient Age:    14 years         BP:           154/85 mmHg Patient Gender: M                HR:           85 bpm. Exam Location:  Outpatient  Procedure: 2D Echo  Indications:    chest pain  History:        Patient has prior history of Echocardiogram examinations, most recent 12/24/2019. COPD; Risk Factors:Dyslipidemia and Hypertension.  Sonographer:  Johny Chess RDCS Referring Phys: [place]   1. Left ventricular ejection fraction, by estimation, is 55 to 60%. The left ventricle has normal function. The left ventricle has no regional wall motion abnormalities. There is mild left ventricular hypertrophy. Left ventricular diastolic parameters are  consistent with Grade I diastolic dysfunction (impaired relaxation). 2. Right ventricular systolic function is normal. The right ventricular size is normal. 3. The mitral valve is normal in structure. No evidence of mitral valve regurgitation. 4. The aortic valve is tricuspid. Aortic valve regurgitation is mild. Mild to moderate aortic valve sclerosis/calcification is present, without any evidence of aortic stenosis.  FINDINGS Left Ventricle: Left ventricular ejection fraction, by estimation, is 55 to 60%. The left ventricle has normal function. The left ventricle has no regional wall motion abnormalities. The left ventricular internal cavity size was normal in size. There is mild left ventricular hypertrophy. Left ventricular diastolic parameters are consistent with Grade I diastolic dysfunction (impaired relaxation).  Right Ventricle: The right ventricular size is normal. No increase in right ventricular wall thickness. Right ventricular systolic function is normal. The tricuspid regurgitant velocity is 2.74 m/s, and with an assumed right atrial pressure of 3 mmHg, the estimated right ventricular systolic pressure is 123456 mmHg.  Left Atrium: Left atrial size was normal in size.  Right Atrium: Right atrial size was normal in size.  Pericardium: There is no evidence of pericardial effusion.  Mitral Valve: The mitral valve is normal in structure. No evidence of mitral valve regurgitation.  Tricuspid Valve: The tricuspid valve is normal in structure. Tricuspid valve regurgitation is trivial.  Aortic Valve: The aortic valve is tricuspid. Aortic valve regurgitation is mild. Aortic regurgitation PHT measures 264 msec. Mild to moderate aortic valve sclerosis/calcification is present, without any evidence of aortic stenosis.  Pulmonic Valve: The pulmonic valve was not well visualized. Pulmonic valve regurgitation is trivial.  Aorta: The aortic root and ascending aorta are structurally normal, with  no evidence of dilitation.  IAS/Shunts: The interatrial septum was not well visualized.   LEFT VENTRICLE PLAX 2D LVIDd:         4.50 cm     Diastology LVIDs:         2.60 cm     LV e' medial:    5.98 cm/s LV PW:         0.90 cm     LV E/e' medial:  12.9 LV IVS:        0.90 cm     LV e' lateral:   7.40 cm/s LVOT diam:     2.10 cm     LV E/e' lateral: 10.4 LV SV:         64 LV SV Index:   33 LVOT Area:     3.46 cm  LV Volumes (MOD) LV vol d, MOD A2C: 76.8 ml LV vol d, MOD A4C: 80.6 ml LV vol s, MOD A2C: 38.3 ml LV vol s, MOD A4C: 37.8 ml LV SV MOD A2C:     38.5 ml LV SV MOD A4C:     80.6 ml LV SV MOD BP:      40.1 ml  RIGHT VENTRICLE RV S prime:     13.30 cm/s TAPSE (M-mode): 1.6 cm  LEFT ATRIUM             Index       RIGHT ATRIUM           Index LA diam:  3.70 cm 1.90 cm/m  RA Area:     11.70 cm LA Vol (A2C):   54.2 ml 27.82 ml/m RA Volume:   25.90 ml  13.30 ml/m LA Vol (A4C):   52.7 ml 27.05 ml/m LA Biplane Vol: 53.5 ml 27.46 ml/m AORTIC VALVE LVOT Vmax:   97.40 cm/s LVOT Vmean:  65.600 cm/s LVOT VTI:    0.184 m AI PHT:      264 msec  AORTA Ao Root diam: 3.00 cm Ao Asc diam:  3.10 cm  MITRAL VALVE                TRICUSPID VALVE MV Area (PHT): 4.49 cm     TR Peak grad:   30.0 mmHg MV Decel Time: 169 msec     TR Vmax:        274.00 cm/s MV E velocity: 77.10 cm/s MV A velocity: 103.00 cm/s  SHUNTS MV E/A ratio:  0.75         Systemic VTI:  0.18 m Systemic Diam: 2.10 cm  Oswaldo Milian MD Electronically signed by Oswaldo Milian MD Signature Date/Time: 06/29/2020/12:41:51 PM    Final    MONITORS  LONG TERM MONITOR (3-14 DAYS) 03/23/2022  Narrative   The dominant rhythm is normal sinus rhythm with normal circadian variation.   There are frequent premature atrial complexes representing roughly 7% of all recorded beats.  There are several brief episodes of ectopic atrial tachycardia.  There is no evidence of fibrillation.   There are  rare premature ventricular contractions.  There are are 3 episodes of 3-beat nonsustained ventricular tachycardia (triplets).   There is no evidence of severe bradycardia, pauses or atrioventricular block.  Abnormal arrhythmia monitor due to the presence of frequent premature atrial contractions and occasional very brief episodes of nonsustained atrial tachycardia.  There is no evidence of atrial fibrillation.   Patch Wear Time:  7 days and 3 hours (2024-01-25T14:42:19-0500 to 2024-02-01T17:48:33-0500)  Patient had a min HR of 49 bpm, max HR of 148 bpm, and avg HR of 77 bpm. Predominant underlying rhythm was Sinus Rhythm. 19 Supraventricular Tachycardia runs occurred, the run with the fastest interval lasting 6 beats with a max rate of 148 bpm, the longest lasting 17 beats with an avg rate of 95 bpm. Isolated SVEs were frequent (6.7%, 52639), SVE Couplets were rare (<1.0%, 191), and SVE Triplets were rare (<1.0%, 63). Isolated VEs were rare (<1.0%, 2283), VE Couplets were rare (<1.0%, 9), and VE Triplets were rare (<1.0%, 3).   CT SCANS  CT CORONARY MORPH W/CTA COR W/SCORE 09/05/2020  Addendum 09/05/2020  2:35 PM ADDENDUM REPORT: 09/05/2020 14:33  CLINICAL DATA:  29M with RA, prostate cancer and chest pain.  EXAM: Cardiac/Coronary  CT  TECHNIQUE: The patient was scanned on a [employer].  FINDINGS: A 120 kV prospective scan was triggered in the descending thoracic aorta at 111 HU's. Axial non-contrast 3 mm slices were carried out through the heart. The data set was analyzed on a dedicated work station and scored using the [place]. Gantry rotation speed was 250 msecs and collimation was .6 mm. No beta blockade and 0.8 mg of sl NTG was given. The 3D data set was reconstructed in 5% intervals of the 67-82 % of the R-R cycle. Diastolic phases were analyzed on a dedicated work station using MPR, MIP and VRT modes. The patient received 80 cc of contrast.  Aorta: Normal  size. Ascending aorta 3.1 cm. Calcification in the aortic root and  descending aorta. No dissection.  Aortic Valve:  Trileaflet.  Mild calcification.  Coronary Arteries:  Normal coronary origin.  Right dominance.  RCA is a small, non-dominant artery with mild (25-49%) calcified plaque.  Left main is a large artery that gives rise to LAD, RI, and LCX arteries.  LAD is a large vessel that has mild (25-49%) mixed plaque in the proximal and mid LAD.  RI is a small vessel that is diffusely disease. It is too small to determine the degree of stenosis.  LCX is a large, dominant artery with minimal (<25%) calcified plaque in the mid vessel and severe (>70%) mixed plaque distally. There is a small, very proximal OM1 with mild (25-49%) calcified plaque proximally. OM2 has minimal (<25%) calcified plaque and there is no plaque in OM3.  Coronary Calcium Score:  Left main: 0  Left anterior descending artery: 989  Left circumflex artery: 259  Right coronary artery: 276  Total: 1523  Percentile: 90th  Other findings:  Normal pulmonary vein drainage into the left atrium.  Normal let atrial appendage without a thrombus.  Normal size of the pulmonary artery.  IMPRESSION: 1. Coronary calcium score of 1523. This was 90th percentile for age-, race-, and sex-matched controls.  2. Normal coronary origin with left dominance.  3. There is severe (>70%) plaque in the distal LCX, and mild (25-49% stenosis in RCA, LAD, and OM1. CAD-RADS 4.  4.  Will send study for FFRct.  Skeet Latch, MD   Electronically Signed By: Skeet Latch On: 09/05/2020 14:33  Narrative EXAM: OVER-READ INTERPRETATION  CT CHEST  The following report is an over-read performed by radiologist Dr. Abigail Miyamoto of [facility], [place] on 09/05/2020. This over-read does not include interpretation of cardiac or coronary anatomy or pathology. The coronary CTA interpretation by the cardiologist  is attached.  COMPARISON:  05/20/2020 chest CTA  FINDINGS: Vascular: Aortic atherosclerosis. No central pulmonary embolism, on this non-dedicated study.  Mediastinum/Nodes: No imaged thoracic adenopathy.  Lungs/Pleura: Trace bilateral pleural fluid, new.  Clear imaged lungs.  Upper Abdomen: Well-circumscribed low-density liver lesions are tiny, likely cysts. Normal imaged portions of the spleen, stomach, right adrenal gland. Incompletely imaged upper pole right renal 1.8 cm low-density lesion is likely a cyst.  Musculoskeletal: Mild bilateral gynecomastia. No acute osseous abnormality.  IMPRESSION: 1. No acute findings in the imaged extracardiac chest. 2. New trace bilateral pleural fluid. 3. Aortic Atherosclerosis (ICD10-I70.0). 4. Bilateral gynecomastia.  Electronically Signed: By: Abigail Miyamoto M.D. On: 09/05/2020 10:53          Assessment & Plan    1.  Chest pain: -Today patient reports ***  2.  Coronary artery disease: -s/p coronary CTA obtained 08/2020 with calcium score of 1523 LHC performed 8/5/2022which revealed 60% LPA V lesion, 30% distal left main to ostial LAD lesion.     3.  Essential hypertension: -Patient's blood pressure today was ***   4.  Hyperlipidemia: -Patient's last LDL cholesterol was 88 -Continue Lipitor 40 mg daily   5.  History of COPD: -Patient currently followed by pulmonology  Disposition: Follow-up with Sanda Klein, MD or APP in *** months {Are you ordering a CV Procedure (e.g. stress test, cath, DCCV, TEE, etc)?   Press F2        :[id number]   Medication Adjustments/Labs and Tests Ordered: Current medicines are reviewed at length with the patient today.  Concerns regarding medicines are outlined above.   Signed, Mable Fill, Marissa Nestle, NP 05/17/2022, 8:21 AM [facility]  Note:  This document was prepared using Dragon voice recognition software and may include unintentional dictation errors.

## 2022-05-18 ENCOUNTER — Ambulatory Visit: Payer: 59 | Attending: Nurse Practitioner | Admitting: Nurse Practitioner

## 2022-05-18 DIAGNOSIS — R079 Chest pain, unspecified: Secondary | ICD-10-CM

## 2022-05-18 DIAGNOSIS — J42 Unspecified chronic bronchitis: Secondary | ICD-10-CM

## 2022-05-18 DIAGNOSIS — I10 Essential (primary) hypertension: Secondary | ICD-10-CM

## 2022-05-18 DIAGNOSIS — E7800 Pure hypercholesterolemia, unspecified: Secondary | ICD-10-CM

## 2022-05-18 DIAGNOSIS — I2510 Atherosclerotic heart disease of native coronary artery without angina pectoris: Secondary | ICD-10-CM

## 2022-05-21 ENCOUNTER — Encounter: Payer: Self-pay | Admitting: Nurse Practitioner

## 2022-05-21 DIAGNOSIS — Z9181 History of falling: Secondary | ICD-10-CM | POA: Diagnosis not present

## 2022-05-21 DIAGNOSIS — Z6824 Body mass index (BMI) 24.0-24.9, adult: Secondary | ICD-10-CM | POA: Diagnosis not present

## 2022-05-21 DIAGNOSIS — Z79899 Other long term (current) drug therapy: Secondary | ICD-10-CM | POA: Diagnosis not present

## 2022-05-21 DIAGNOSIS — M545 Low back pain, unspecified: Secondary | ICD-10-CM | POA: Diagnosis not present

## 2022-05-21 DIAGNOSIS — M25561 Pain in right knee: Secondary | ICD-10-CM | POA: Diagnosis not present

## 2022-05-21 DIAGNOSIS — M25562 Pain in left knee: Secondary | ICD-10-CM | POA: Diagnosis not present

## 2022-05-21 DIAGNOSIS — E559 Vitamin D deficiency, unspecified: Secondary | ICD-10-CM | POA: Diagnosis not present

## 2022-05-21 DIAGNOSIS — G894 Chronic pain syndrome: Secondary | ICD-10-CM | POA: Diagnosis not present

## 2022-05-23 DIAGNOSIS — Z79899 Other long term (current) drug therapy: Secondary | ICD-10-CM | POA: Diagnosis not present

## 2022-05-28 ENCOUNTER — Other Ambulatory Visit: Payer: Self-pay

## 2022-05-28 DIAGNOSIS — J449 Chronic obstructive pulmonary disease, unspecified: Secondary | ICD-10-CM

## 2022-05-28 DIAGNOSIS — I10 Essential (primary) hypertension: Secondary | ICD-10-CM

## 2022-05-28 MED ORDER — INCRUSE ELLIPTA 62.5 MCG/ACT IN AEPB: 1.0000 | INHALATION_SPRAY | Freq: Every day | RESPIRATORY_TRACT | 2 refills | Status: DC

## 2022-05-28 MED ORDER — AMLODIPINE BESYLATE 5 MG PO TABS: 5.0000 mg | ORAL_TABLET | Freq: Every day | ORAL | 3 refills | Status: DC

## 2022-05-28 MED ORDER — ALBUTEROL SULFATE HFA 108 (90 BASE) MCG/ACT IN AERS: INHALATION_SPRAY | RESPIRATORY_TRACT | 3 refills | Status: DC

## 2022-06-18 ENCOUNTER — Telehealth: Payer: Self-pay

## 2022-06-18 NOTE — Telephone Encounter (Signed)
Joel Donaldson with [employer] calls nurse line regards to Ensure paperwork.   She reports she received an order from Dr. Laroy Apple allowing patient to receive Ensure supplements.   She reports she is needing a dx code. This can be given verbally.   Will forward to [place].

## 2022-06-19 DIAGNOSIS — G894 Chronic pain syndrome: Secondary | ICD-10-CM | POA: Diagnosis not present

## 2022-06-19 DIAGNOSIS — M545 Low back pain, unspecified: Secondary | ICD-10-CM | POA: Diagnosis not present

## 2022-06-19 DIAGNOSIS — G8929 Other chronic pain: Secondary | ICD-10-CM | POA: Diagnosis not present

## 2022-06-19 DIAGNOSIS — E559 Vitamin D deficiency, unspecified: Secondary | ICD-10-CM | POA: Diagnosis not present

## 2022-06-19 DIAGNOSIS — R7303 Prediabetes: Secondary | ICD-10-CM | POA: Diagnosis not present

## 2022-06-19 DIAGNOSIS — M25561 Pain in right knee: Secondary | ICD-10-CM | POA: Diagnosis not present

## 2022-06-19 DIAGNOSIS — M25562 Pain in left knee: Secondary | ICD-10-CM | POA: Diagnosis not present

## 2022-06-19 DIAGNOSIS — Z9181 History of falling: Secondary | ICD-10-CM | POA: Diagnosis not present

## 2022-06-19 DIAGNOSIS — Z79899 Other long term (current) drug therapy: Secondary | ICD-10-CM | POA: Diagnosis not present

## 2022-06-19 NOTE — Telephone Encounter (Signed)
Diagnosis given to [facility].

## 2022-06-21 DIAGNOSIS — Z79899 Other long term (current) drug therapy: Secondary | ICD-10-CM | POA: Diagnosis not present

## 2022-07-15 ENCOUNTER — Other Ambulatory Visit: Payer: Self-pay | Admitting: Cardiovascular Disease

## 2022-07-17 ENCOUNTER — Other Ambulatory Visit: Payer: Self-pay

## 2022-07-17 DIAGNOSIS — I10 Essential (primary) hypertension: Secondary | ICD-10-CM

## 2022-07-18 DIAGNOSIS — M25561 Pain in right knee: Secondary | ICD-10-CM | POA: Diagnosis not present

## 2022-07-18 DIAGNOSIS — M545 Low back pain, unspecified: Secondary | ICD-10-CM | POA: Diagnosis not present

## 2022-07-18 DIAGNOSIS — M25562 Pain in left knee: Secondary | ICD-10-CM | POA: Diagnosis not present

## 2022-07-18 DIAGNOSIS — R7303 Prediabetes: Secondary | ICD-10-CM | POA: Diagnosis not present

## 2022-07-18 DIAGNOSIS — Z79899 Other long term (current) drug therapy: Secondary | ICD-10-CM | POA: Diagnosis not present

## 2022-07-18 DIAGNOSIS — E559 Vitamin D deficiency, unspecified: Secondary | ICD-10-CM | POA: Diagnosis not present

## 2022-07-18 DIAGNOSIS — G8929 Other chronic pain: Secondary | ICD-10-CM | POA: Diagnosis not present

## 2022-07-18 DIAGNOSIS — G894 Chronic pain syndrome: Secondary | ICD-10-CM | POA: Diagnosis not present

## 2022-07-18 DIAGNOSIS — Z9181 History of falling: Secondary | ICD-10-CM | POA: Diagnosis not present

## 2022-07-18 MED ORDER — LISINOPRIL 20 MG PO TABS: ORAL_TABLET | ORAL | 0 refills | Status: DC

## 2022-07-20 DIAGNOSIS — Z79899 Other long term (current) drug therapy: Secondary | ICD-10-CM | POA: Diagnosis not present

## 2022-08-13 ENCOUNTER — Ambulatory Visit (INDEPENDENT_AMBULATORY_CARE_PROVIDER_SITE_OTHER): Payer: 59

## 2022-08-13 VITALS — Ht 64.0 in | Wt 181.0 lb

## 2022-08-13 DIAGNOSIS — Z0000 Encounter for general adult medical examination without abnormal findings: Secondary | ICD-10-CM

## 2022-08-13 NOTE — Patient Instructions (Addendum)
Joel Donaldson , Thank you for taking time to come for your Medicare Wellness Visit. I appreciate your ongoing commitment to your health goals. Please review the following plan we discussed and let me know if I can assist you in the future.   These are the goals we discussed:  Goals   None     This is a list of the screening recommended for you and due dates:  Health Maintenance  Topic Date Due   Zoster (Shingles) Vaccine (1 of 2) Never done   COVID-19 Vaccine (5 - 2023-24 season) 10/13/2021   Flu Shot  09/13/2022   Medicare Annual Wellness Visit  08/13/2023   DTaP/Tdap/Td vaccine (3 - Td or Tdap) 03/16/2025   Pneumonia Vaccine  Completed   HPV Vaccine  Aged Out    Advanced directives: Information on Advanced Care Planning can be found at [facility] Advance Health Care Directives Advance Health Care Directives ([email or web address] Please bring a copy of your health care power of attorney and living will to the office to be added to your chart at your convenience.   Conditions/risks identified: Aim for 30 minutes of exercise or brisk walking, 6-8 glasses of water, and 5 servings of fruits and vegetables each day.  Next appointment: Follow up in one year for your annual wellness visit.   Preventive Care 25 Years and Older, Male  Preventive care refers to lifestyle choices and visits with your health care provider that can promote health and wellness. What does preventive care include? A yearly physical exam. This is also called an annual well check. Dental exams once or twice a year. Routine eye exams. Ask your health care provider how often you should have your eyes checked. Personal lifestyle choices, including: Daily care of your teeth and gums. Regular physical activity. Eating a healthy diet. Avoiding tobacco and drug use. Limiting alcohol use. Practicing safe sex. Taking low doses of aspirin every day. Taking vitamin and mineral supplements as recommended by your  health care provider. What happens during an annual well check? The services and screenings done by your health care provider during your annual well check will depend on your age, overall health, lifestyle risk factors, and family history of disease. Counseling  Your health care provider may ask you questions about your: Alcohol use. Tobacco use. Drug use. Emotional well-being. Home and relationship well-being. Sexual activity. Eating habits. History of falls. Memory and ability to understand (cognition). Work and work Astronomer. Screening  You may have the following tests or measurements: Height, weight, and BMI. Blood pressure. Lipid and cholesterol levels. These may be checked every 5 years, or more frequently if you are over 85 years old. Skin check. Lung cancer screening. You may have this screening every year starting at age 37 if you have a 30-pack-year history of smoking and currently smoke or have quit within the past 15 years. Fecal occult blood test (FOBT) of the stool. You may have this test every year starting at age 58. Flexible sigmoidoscopy or colonoscopy. You may have a sigmoidoscopy every 5 years or a colonoscopy every 10 years starting at age 37. Prostate cancer screening. Recommendations will vary depending on your family history and other risks. Hepatitis C blood test. Hepatitis B blood test. Sexually transmitted disease (STD) testing. Diabetes screening. This is done by checking your blood sugar (glucose) after you have not eaten for a while (fasting). You may have this done every 1-3 years. Abdominal aortic aneurysm (AAA) screening. You may  need this if you are a current or former smoker. Osteoporosis. You may be screened starting at age 80 if you are at high risk. Talk with your health care provider about your test results, treatment options, and if necessary, the need for more tests. Vaccines  Your health care provider may recommend certain vaccines, such  as: Influenza vaccine. This is recommended every year. Tetanus, diphtheria, and acellular pertussis (Tdap, Td) vaccine. You may need a Td booster every 10 years. Zoster vaccine. You may need this after age 33. Pneumococcal 13-valent conjugate (PCV13) vaccine. One dose is recommended after age 15. Pneumococcal polysaccharide (PPSV23) vaccine. One dose is recommended after age 66. Talk to your health care provider about which screenings and vaccines you need and how often you need them. This information is not intended to replace advice given to you by your health care provider. Make sure you discuss any questions you have with your health care provider. Document Released: 02/25/2015 Document Revised: 10/19/2015 Document Reviewed: 11/30/2014 Elsevier Interactive Patient Education  2017 [place] Prevention in the Home Falls can cause injuries. They can happen to people of all ages. There are many things you can do to make your home safe and to help prevent falls. What can I do on the outside of my home? Regularly fix the edges of walkways and driveways and fix any cracks. Remove anything that might make you trip as you walk through a door, such as a raised step or threshold. Trim any bushes or trees on the path to your home. Use bright outdoor lighting. Clear any walking paths of anything that might make someone trip, such as rocks or tools. Regularly check to see if handrails are loose or broken. Make sure that both sides of any steps have handrails. Any raised decks and porches should have guardrails on the edges. Have any leaves, snow, or ice cleared regularly. Use sand or salt on walking paths during winter. Clean up any spills in your garage right away. This includes oil or grease spills. What can I do in the bathroom? Use night lights. Install grab bars by the toilet and in the tub and shower. Do not use towel bars as grab bars. Use non-skid mats or decals in the tub or  shower. If you need to sit down in the shower, use a plastic, non-slip stool. Keep the floor dry. Clean up any water that spills on the floor as soon as it happens. Remove soap buildup in the tub or shower regularly. Attach bath mats securely with double-sided non-slip rug tape. Do not have throw rugs and other things on the floor that can make you trip. What can I do in the bedroom? Use night lights. Make sure that you have a light by your bed that is easy to reach. Do not use any sheets or blankets that are too big for your bed. They should not hang down onto the floor. Have a firm chair that has side arms. You can use this for support while you get dressed. Do not have throw rugs and other things on the floor that can make you trip. What can I do in the kitchen? Clean up any spills right away. Avoid walking on wet floors. Keep items that you use a lot in easy-to-reach places. If you need to reach something above you, use a strong step stool that has a grab bar. Keep electrical cords out of the way. Do not use floor polish or wax that makes  floors slippery. If you must use wax, use non-skid floor wax. Do not have throw rugs and other things on the floor that can make you trip. What can I do with my stairs? Do not leave any items on the stairs. Make sure that there are handrails on both sides of the stairs and use them. Fix handrails that are broken or loose. Make sure that handrails are as long as the stairways. Check any carpeting to make sure that it is firmly attached to the stairs. Fix any carpet that is loose or worn. Avoid having throw rugs at the top or bottom of the stairs. If you do have throw rugs, attach them to the floor with carpet tape. Make sure that you have a light switch at the top of the stairs and the bottom of the stairs. If you do not have them, ask someone to add them for you. What else can I do to help prevent falls? Wear shoes that: Do not have high heels. Have  rubber bottoms. Are comfortable and fit you well. Are closed at the toe. Do not wear sandals. If you use a stepladder: Make sure that it is fully opened. Do not climb a closed stepladder. Make sure that both sides of the stepladder are locked into place. Ask someone to hold it for you, if possible. Clearly mark and make sure that you can see: Any grab bars or handrails. First and last steps. Where the edge of each step is. Use tools that help you move around (mobility aids) if they are needed. These include: Canes. Walkers. Scooters. Crutches. Turn on the lights when you go into a dark area. Replace any light bulbs as soon as they burn out. Set up your furniture so you have a clear path. Avoid moving your furniture around. If any of your floors are uneven, fix them. If there are any pets around you, be aware of where they are. Review your medicines with your doctor. Some medicines can make you feel dizzy. This can increase your chance of falling. Ask your doctor what other things that you can do to help prevent falls. This information is not intended to replace advice given to you by your health care provider. Make sure you discuss any questions you have with your health care provider. Document Released: 11/25/2008 Document Revised: 07/07/2015 Document Reviewed: 03/05/2014 Elsevier Interactive Patient Education  2017 [employer].

## 2022-08-13 NOTE — Progress Notes (Signed)
Subjective:   Joel Donaldson is a 82 y.o. male who presents for Medicare Annual/Subsequent preventive examination.  Visit Complete: Virtual  I connected with  Joel Donaldson on 08/13/22 by a audio enabled telemedicine application and verified that I am speaking with the correct person using two identifiers.  Patient Location: Home  Provider Location: Home Office  I discussed the limitations of evaluation and management by telemedicine. The patient expressed understanding and agreed to proceed.  Review of Systems     Cardiac Risk Factors include: advanced age (>38men, >32 women);male gender;hypertension;smoking/ tobacco exposure;dyslipidemia     Objective:    Today's Vitals   08/13/22 0927  Weight: 181 lb (82.1 kg)  Height: 5\' 4"  (1.626 m)   Body mass index is 31.07 kg/m.     08/13/2022    4:01 PM 04/16/2022   10:14 AM 03/08/2022   11:05 AM 10/31/2021    9:28 AM 10/23/2021   10:33 AM 10/14/2021    9:14 AM 09/26/2021   10:27 AM  Advanced Directives  Does Patient Have a Medical Advance Directive? No No No Yes No No No  Type of Theme park manager;Living will     Copy of Healthcare Power of Attorney in Chart?    No - copy requested     Would patient like information on creating a medical advance directive? Yes (MAU/Ambulatory/Procedural Areas - Information given) Yes (MAU/Ambulatory/Procedural Areas - Information given) No - Patient declined    No - Patient declined    Current Medications (verified) Outpatient Encounter Medications as of 08/13/2022  Medication Sig   acetaminophen (TYLENOL) 500 MG tablet Take 2 tablets (1,000 mg total) by mouth every 6 (six) hours as needed.   albuterol (VENTOLIN HFA) 108 (90 Base) MCG/ACT inhaler INHALE 1 TO 2 PUFFS INTO THE LUNGS EVERY 4 HOURS AS NEEDED FOR WHEEZING OR SHORTNESS OF BREATH   amLODipine (NORVASC) 5 MG tablet Take 1 tablet (5 mg total) by mouth daily.   aspirin EC 81 MG tablet Take 81 mg by mouth  every morning.    atorvastatin (LIPITOR) 20 MG tablet TAKE 1 TABLET(20 MG) BY MOUTH DAILY   bacitracin ointment Apply 1 Application topically 2 (two) times daily.   buprenorphine (BUTRANS) 7.5 MCG/HR Place 1 patch onto the skin every Thursday.   diclofenac Sodium (VOLTAREN) 1 % GEL Apply 4 g topically 4 (four) times daily.   lidocaine (XYLOCAINE) 5 % ointment Apply 1 application topically daily as needed for mild pain or moderate pain.   lisinopril (ZESTRIL) 20 MG tablet TAKE 2 TABLETS(40MG ) BY MOUTH DAILY   Multiple Vitamins-Minerals (ONE-A-DAY PROACTIVE 65+) TABS Take 1 tablet by mouth daily with breakfast.   nitroGLYCERIN (NITROSTAT) 0.4 MG SL tablet Place 1 tablet (0.4 mg total) under the tongue every 5 (five) minutes as needed for chest pain (chest pain).   oxyCODONE-acetaminophen (PERCOCET) 10-325 MG tablet    polyethylene glycol (MIRALAX / GLYCOLAX) 17 g packet Take 17 g by mouth daily as needed for mild constipation.   umeclidinium bromide (INCRUSE ELLIPTA) 62.5 MCG/ACT AEPB Inhale 1 puff into the lungs daily.   No facility-administered encounter medications on file as of 08/13/2022.    Allergies (verified) Tramadol and Pollen extract   History: Past Medical History:  Diagnosis Date   Acute nonintractable headache 05/31/2021   Alcohol abuse    Anxiety    Arthritis    hands, back, knees   Asthma    Atypical angina (HCC) (  Class III) 12/19/2011   Baker's cyst of knee, right 06/21/2016   Chest pain at rest 10/04/2016   Depression    Epigastric pain 05/20/2020   Fall 10/23/2021   GERD (gastroesophageal reflux disease)    Glaucoma    Heart murmur asymptomatic   Hx of radioisotope therapy 07/26/2011   Radiosactive Prostate Seed Implant/ I-125 Seeds   Hypertension    Neck pain 10/22/2019   Prostate cancer (HCC) dx'd 2016 or 2017   seed implants   Rheumatoid arthritis (HCC) 10/10/2012   Past Surgical History:  Procedure Laterality Date   CYSTOSCOPY  09/14/2011   Procedure:  CYSTOSCOPY FLEXIBLE;  Surgeon: Lindaann Slough, MD;  Location: [facility];  Service: Urology;  Laterality: N/A;  No seeds seen in the bladder   LEFT HEART CATH AND CORONARY ANGIOGRAPHY N/A 09/16/2020   Procedure: LEFT HEART CATH AND CORONARY ANGIOGRAPHY;  Surgeon: Marykay Lex, MD;  Location: [facility];  Service: Cardiovascular;  Laterality: N/A;   LUMBAR FUSION  1995   RADIOACTIVE SEED IMPLANT  09/14/2011   Procedure: RADIOACTIVE SEED IMPLANT;  Surgeon: Lindaann Slough, MD;  Location: [facility];  Service: Urology;  Laterality: N/A;  Total number of seeds -     SHOULDER SURGERY  YRS AGO   LEFT   Family History  Problem Relation Age of Onset   Diabetes Mother    Heart disease Father        lived to 44 - had bypass, pacemaker   Social History   Socioeconomic History   Marital status: Legally Separated    Spouse name: Pattricia Boss   Number of children: 1   Years of education: 11   Highest education level: Not on file  Occupational History   Occupation: Retired    Associate Professor: RETIRED  Tobacco Use   Smoking status: Former    Packs/day: 2.00    Years: 50.00    Additional pack years: 0.00    Total pack years: 100.00    Types: Cigarettes    Start date: 02/12/1953    Quit date: 10/13/2004    Years since quitting: 17.8   Smokeless tobacco: Never   Tobacco comments:    Smoked 2 ppd  Vaping Use   Vaping Use: Never used  Substance and Sexual Activity   Alcohol use: Not Currently    Alcohol/week: 14.0 standard drinks of alcohol    Types: 14 Shots of liquor per week    Comment: 1/2 pint of liquor daily   Drug use: No   Sexual activity: Not Currently  Other Topics Concern   Not on file  Social History Narrative   Patient lives with his granddaughter in [place].   Patient is currently legally separated.    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    Patient enjoys seeing friends, hunting, and fishing.    Receives aide  services 6 days week          Social Determinants of Health   Financial Resource Strain: Low Risk  (08/13/2022)   Overall Financial Resource Strain (CARDIA)    Difficulty of Paying Living Expenses: Not hard at all  Food Insecurity: No Food Insecurity (08/13/2022)   Hunger Vital Sign    Worried About Running Out of Food in the Last Year: Never true    Ran Out of Food in the Last Year: Never true  Transportation Needs: No Transportation Needs (08/13/2022)   PRAPARE - Administrator, Civil Service (  Medical): No    Lack of Transportation (Non-Medical): No  Physical Activity: Insufficiently Active (08/13/2022)   Exercise Vital Sign    Days of Exercise per Week: 3 days    Minutes of Exercise per Session: 30 min  Stress: No Stress Concern Present (08/13/2022)   [employer] of Occupational Health - Occupational Stress Questionnaire    Feeling of Stress : Not at all  Social Connections: Moderately Isolated (08/13/2022)   Social Connection and Isolation Panel [NHANES]    Frequency of Communication with Friends and Family: More than three times a week    Frequency of Social Gatherings with Friends and Family: Three times a week    Attends Religious Services: More than 4 times per year    Active Member of Clubs or Organizations: No    Attends Banker Meetings: Never    Marital Status: Separated    Tobacco Counseling Counseling given: Not Answered Tobacco comments: Smoked 2 ppd   Clinical Intake:  Pre-visit preparation completed: Yes  Pain : No/denies pain     Diabetes: No  How often do you need to have someone help you when you read instructions, pamphlets, or other written materials from your doctor or pharmacy?: 1 - Never  Interpreter Needed?: No  Information entered by :: Kandis Fantasia LPN   Activities of Daily Living    08/13/2022    3:45 PM  In your present state of health, do you have any difficulty performing the following activities:  Hearing?  0  Vision? 0  Difficulty concentrating or making decisions? 0  Walking or climbing stairs? 0  Dressing or bathing? 0  Doing errands, shopping? 0  Preparing Food and eating ? N  Using the Toilet? N  In the past six months, have you accidently leaked urine? N  Do you have problems with loss of bowel control? N  Managing your Medications? N  Managing your Finances? N  Housekeeping or managing your Housekeeping? N    Patient Care Team: Levin Erp, MD as PCP - General (Family Medicine) Croitoru, Rachelle Hora, MD as PCP - Cardiology (Cardiology) Ernesto Rutherford, MD (Ophthalmology) Dr. Carrie Mew (Rheumatology) Dr. Gilford Raid (Oncology)  Indicate any recent Medical Services you may have received from other than [facility] providers in the past year (date may be approximate).     Assessment:   This is a routine wellness examination for Joel Donaldson.  Hearing/Vision screen Hearing Screening - Comments:: Denies hearing difficulties   Vision Screening - Comments:: Wears rx glasses - routine eye exam needed    Dietary issues and exercise activities discussed:     Goals Addressed               This Visit's Progress     COMPLETED: Care Coordination Activities (pt-stated)        Patient requesting additional hours for In home  services with Aid. SW will send request to PCP.  SW completed SDOH screening. Patient has no additional needs.       COMPLETED: Care Coordination Activities- CAP (pt-stated)        Patient is requesting increased hours with CAP services. Patient advised he has already completed paperwork with PCP. SW will follow up with patient within 30 days.   Patient declined needing assistance with food, housing or transportation.       COMPLETED: exercise 2x per week 15 minutes per time        Depression Screen    08/13/2022    3:58 PM 04/16/2022  10:16 AM 03/08/2022   11:05 AM 11/21/2021    2:12 PM 10/23/2021   10:31 AM 05/25/2021    2:09 PM 01/10/2021    2:23 PM  PHQ 2/9 Scores   PHQ - 2 Score 0 1 1 3  2 2   PHQ- 9 Score  3 2 7  7 3   Exception Documentation     Patient refusal      Fall Risk    08/13/2022    4:02 PM 04/16/2022   10:16 AM 03/08/2022   11:04 AM 11/21/2021    2:12 PM 10/23/2021   10:32 AM  Fall Risk   Falls in the past year? 0 0 0 0 1  Number falls in past yr: 0 0 0 0 0  Injury with Fall? 0 0  0 0  Risk for fall due to : No Fall Risks      Follow up Falls prevention discussed;Education provided;Falls evaluation completed        MEDICARE RISK AT HOME:  Medicare Risk at Home - 08/13/22 1603     Any stairs in or around the home? No    If so, are there any without handrails? No    Home free of loose throw rugs in walkways, pet beds, electrical cords, etc? Yes    Adequate lighting in your home to reduce risk of falls? Yes    Life alert? No    Use of a cane, walker or w/c? No    Grab bars in the bathroom? Yes    Shower chair or bench in shower? No    Elevated toilet seat or a handicapped toilet? Yes             TIMED UP AND GO:  Was the test performed?  No    Cognitive Function:    05/03/2011   11:00 AM  MMSE - Mini Mental State Exam  Orientation to time 5  Orientation to Place 5  Registration 3  Attention/ Calculation 0  Recall 3  Language- name 2 objects 2  Language- repeat 1  Language- follow 3 step command 3  Language- read & follow direction 0  Write a sentence 0  Copy design 0  Total score 22        08/13/2022    4:04 PM 05/24/2020    9:16 AM  6CIT Screen  What Year? 0 points 0 points  What month? 0 points 0 points  What time? 0 points 0 points  Count back from 20 0 points 0 points  Months in reverse 2 points 2 points  Repeat phrase 0 points 0 points  Total Score 2 points 2 points    Immunizations Immunization History  Administered Date(s) Administered   Fluad Quad(high Dose 65+) 11/24/2018, 10/22/2019   Influenza Split 12/08/2010, 12/03/2011   Influenza Whole 11/13/2012   Influenza, High Dose Seasonal PF  11/25/2013   Influenza,inj,Quad PF,6+ Mos 03/16/2016, 11/02/2016   Influenza-Unspecified 02/12/2019   PFIZER Comirnaty(Gray Top)Covid-19 Tri-Sucrose Vaccine 06/11/2020   PFIZER(Purple Top)SARS-COV-2 Vaccination 04/08/2019, 04/29/2019, 11/10/2019   Pneumococcal Conjugate-13 07/03/2013   Pneumococcal Polysaccharide-23 10/13/2005   Td 01/13/2003   Tdap 03/17/2015   Zoster, Live 07/02/2014    TDAP status: Up to date  Pneumococcal vaccine status: Up to date  Covid-19 vaccine status: Information provided on how to obtain vaccines.   Qualifies for Shingles Vaccine? No   Zostavax completed Yes   Shingrix Completed?: No.    Education has been provided regarding the importance of this vaccine.  Patient has been advised to call insurance company to determine out of pocket expense if they have not yet received this vaccine. Advised may also receive vaccine at local pharmacy or Health Dept. Verbalized acceptance and understanding.  Screening Tests Health Maintenance  Topic Date Due   Zoster Vaccines- Shingrix (1 of 2) Never done   COVID-19 Vaccine (5 - 2023-24 season) 10/13/2021   INFLUENZA VACCINE  09/13/2022   Medicare Annual Wellness (AWV)  08/13/2023   DTaP/Tdap/Td (3 - Td or Tdap) 03/16/2025   Pneumonia Vaccine 43+ Years old  Completed   HPV VACCINES  Aged Out    Health Maintenance  Health Maintenance Due  Topic Date Due   Zoster Vaccines- Shingrix (1 of 2) Never done   COVID-19 Vaccine (5 - 2023-24 season) 10/13/2021    Colorectal cancer screening: No longer required.   Lung Cancer Screening: (Low Dose CT Chest recommended if Age 76-80 years, 20 pack-year currently smoking OR have quit w/in 15years.) does not qualify.   Lung Cancer Screening Referral: n/a  Additional Screening:  Hepatitis C Screening: does not qualify  Vision Screening: Recommended annual ophthalmology exams for early detection of glaucoma and other disorders of the eye. Is the patient up to date with  their annual eye exam?  No  Who is the provider or what is the name of the office in which the patient attends annual eye exams? none If pt is not established with a provider, would they like to be referred to a provider to establish care? No .   Dental Screening: Recommended annual dental exams for proper oral hygiene  Community Resource Referral / Chronic Care Management: CRR required this visit?  No   CCM required this visit?  No     Plan:     I have personally reviewed and noted the following in the patient's chart:   Medical and social history Use of alcohol, tobacco or illicit drugs  Current medications and supplements including opioid prescriptions. Patient is not currently taking opioid prescriptions. Functional ability and status Nutritional status Physical activity Advanced directives List of other physicians Hospitalizations, surgeries, and ER visits in previous 12 months Vitals Screenings to include cognitive, depression, and falls Referrals and appointments  In addition, I have reviewed and discussed with patient certain preventive protocols, quality metrics, and best practice recommendations. A written personalized care plan for preventive services as well as general preventive health recommendations were provided to patient.     Kandis Fantasia [place], [place]   4/0/9811   After Visit Summary: (Mail) Due to this being a telephonic visit, the after visit summary with patients personalized plan was offered to patient via mail   Nurse Notes: Patient has been having episodes of loss of consciousness with an accompanying headache.  Recently had an episode last week.  Also has a feeling that the episode is coming prior to.  Would like referral to neurology.

## 2022-08-17 DIAGNOSIS — M25562 Pain in left knee: Secondary | ICD-10-CM | POA: Diagnosis not present

## 2022-08-17 DIAGNOSIS — Z6824 Body mass index (BMI) 24.0-24.9, adult: Secondary | ICD-10-CM | POA: Diagnosis not present

## 2022-08-17 DIAGNOSIS — R7303 Prediabetes: Secondary | ICD-10-CM | POA: Diagnosis not present

## 2022-08-17 DIAGNOSIS — M545 Low back pain, unspecified: Secondary | ICD-10-CM | POA: Diagnosis not present

## 2022-08-17 DIAGNOSIS — Z79899 Other long term (current) drug therapy: Secondary | ICD-10-CM | POA: Diagnosis not present

## 2022-08-17 DIAGNOSIS — M25561 Pain in right knee: Secondary | ICD-10-CM | POA: Diagnosis not present

## 2022-08-17 DIAGNOSIS — Z9181 History of falling: Secondary | ICD-10-CM | POA: Diagnosis not present

## 2022-08-17 DIAGNOSIS — G8929 Other chronic pain: Secondary | ICD-10-CM | POA: Diagnosis not present

## 2022-08-17 DIAGNOSIS — E559 Vitamin D deficiency, unspecified: Secondary | ICD-10-CM | POA: Diagnosis not present

## 2022-08-21 DIAGNOSIS — Z79899 Other long term (current) drug therapy: Secondary | ICD-10-CM | POA: Diagnosis not present

## 2022-08-28 ENCOUNTER — Emergency Department (HOSPITAL_COMMUNITY): Payer: 59

## 2022-08-28 ENCOUNTER — Other Ambulatory Visit: Payer: Self-pay

## 2022-08-28 ENCOUNTER — Observation Stay (HOSPITAL_COMMUNITY)
Admission: EM | Admit: 2022-08-28 | Discharge: 2022-08-29 | Disposition: A | Discharge status: Home / Self Care | Payer: 59 | Attending: Family Medicine | Admitting: Family Medicine

## 2022-08-28 ENCOUNTER — Encounter (HOSPITAL_COMMUNITY): Payer: Self-pay | Admitting: Family Medicine

## 2022-08-28 DIAGNOSIS — R404 Transient alteration of awareness: Secondary | ICD-10-CM | POA: Diagnosis not present

## 2022-08-28 DIAGNOSIS — R6889 Other general symptoms and signs: Secondary | ICD-10-CM | POA: Diagnosis not present

## 2022-08-28 DIAGNOSIS — E860 Dehydration: Secondary | ICD-10-CM | POA: Diagnosis not present

## 2022-08-28 DIAGNOSIS — I959 Hypotension, unspecified: Secondary | ICD-10-CM

## 2022-08-28 DIAGNOSIS — W19XXXA Unspecified fall, initial encounter: Secondary | ICD-10-CM | POA: Diagnosis not present

## 2022-08-28 DIAGNOSIS — R55 Syncope and collapse: Secondary | ICD-10-CM | POA: Diagnosis not present

## 2022-08-28 DIAGNOSIS — I10 Essential (primary) hypertension: Secondary | ICD-10-CM | POA: Diagnosis not present

## 2022-08-28 DIAGNOSIS — J45909 Unspecified asthma, uncomplicated: Secondary | ICD-10-CM | POA: Insufficient documentation

## 2022-08-28 DIAGNOSIS — Z87891 Personal history of nicotine dependence: Secondary | ICD-10-CM | POA: Diagnosis not present

## 2022-08-28 DIAGNOSIS — J9811 Atelectasis: Secondary | ICD-10-CM | POA: Diagnosis not present

## 2022-08-28 DIAGNOSIS — Z79899 Other long term (current) drug therapy: Secondary | ICD-10-CM | POA: Diagnosis not present

## 2022-08-28 DIAGNOSIS — R231 Pallor: Secondary | ICD-10-CM | POA: Diagnosis not present

## 2022-08-28 DIAGNOSIS — I951 Orthostatic hypotension: Secondary | ICD-10-CM

## 2022-08-28 DIAGNOSIS — Z7982 Long term (current) use of aspirin: Secondary | ICD-10-CM | POA: Insufficient documentation

## 2022-08-28 DIAGNOSIS — R42 Dizziness and giddiness: Secondary | ICD-10-CM | POA: Diagnosis not present

## 2022-08-28 LAB — URINALYSIS, ROUTINE W REFLEX MICROSCOPIC
Bilirubin Urine: NEGATIVE
Glucose, UA: NEGATIVE mg/dL
Hgb urine dipstick: NEGATIVE
Ketones, ur: NEGATIVE mg/dL
Leukocytes,Ua: NEGATIVE
Nitrite: NEGATIVE
Protein, ur: NEGATIVE mg/dL
Specific Gravity, Urine: 1.01 (ref 1.005–1.030)
pH: 7 (ref 5.0–8.0)

## 2022-08-28 LAB — COMPREHENSIVE METABOLIC PANEL
ALT: 20 U/L (ref 0–44)
AST: 23 U/L (ref 15–41)
Albumin: 3.7 g/dL (ref 3.5–5.0)
Alkaline Phosphatase: 56 U/L (ref 38–126)
Anion gap: 7 (ref 5–15)
BUN: 11 mg/dL (ref 8–23)
CO2: 23 mmol/L (ref 22–32)
Calcium: 8.6 mg/dL — ABNORMAL LOW (ref 8.9–10.3)
Chloride: 110 mmol/L (ref 98–111)
Creatinine, Ser: 0.93 mg/dL (ref 0.61–1.24)
GFR, Estimated: >60 mL/min (ref >60)
Glucose, Bld: 100 mg/dL — ABNORMAL HIGH (ref 70–99)
Potassium: 4.2 mmol/L (ref 3.5–5.1)
Sodium: 140 mmol/L (ref 135–145)
Total Bilirubin: 0.6 mg/dL (ref 0.3–1.2)
Total Protein: 6.3 g/dL — ABNORMAL LOW (ref 6.5–8.1)

## 2022-08-28 LAB — CBC WITH DIFFERENTIAL/PLATELET
Abs Immature Granulocytes: 0.01 10*3/uL (ref 0.00–0.07)
Basophils Absolute: 0 10*3/uL (ref 0.0–0.1)
Basophils Relative: 0 %
Eosinophils Absolute: 0 10*3/uL (ref 0.0–0.5)
Eosinophils Relative: 0 %
HCT: 39.6 % (ref 39.0–52.0)
Hemoglobin: 12.6 g/dL — ABNORMAL LOW (ref 13.0–17.0)
Immature Granulocytes: 0 %
Lymphocytes Relative: 12 %
Lymphs Abs: 0.6 10*3/uL — ABNORMAL LOW (ref 0.7–4.0)
MCH: 28.4 pg (ref 26.0–34.0)
MCHC: 31.8 g/dL (ref 30.0–36.0)
MCV: 89.2 fL (ref 80.0–100.0)
Monocytes Absolute: 0.4 10*3/uL (ref 0.1–1.0)
Monocytes Relative: 9 %
Neutro Abs: 3.7 10*3/uL (ref 1.7–7.7)
Neutrophils Relative %: 79 %
Platelets: 210 10*3/uL (ref 150–400)
RBC: 4.44 MIL/uL (ref 4.22–5.81)
RDW: 13.4 % (ref 11.5–15.5)
WBC: 4.7 10*3/uL (ref 4.0–10.5)
nRBC: 0 % (ref 0.0–0.2)

## 2022-08-28 LAB — TROPONIN I (HIGH SENSITIVITY)
Troponin I (High Sensitivity): 6 ng/L (ref <18)
Troponin I (High Sensitivity): 7 ng/L (ref <18)

## 2022-08-28 LAB — TSH: TSH: 1.329 u[IU]/mL (ref 0.350–4.500)

## 2022-08-28 LAB — BRAIN NATRIURETIC PEPTIDE: B Natriuretic Peptide: 32.1 pg/mL (ref 0.0–100.0)

## 2022-08-28 MED ORDER — PANTOPRAZOLE SODIUM 40 MG PO TBEC
40.0000 mg | DELAYED_RELEASE_TABLET | Freq: Every day | ORAL | Status: DC
  Administered 2022-08-28 – 2022-08-29 (×2): 40 mg via ORAL
  Filled 2022-08-28 (×2): qty 1

## 2022-08-28 MED ORDER — ENOXAPARIN SODIUM 40 MG/0.4ML IJ SOSY
40.0000 mg | PREFILLED_SYRINGE | INTRAMUSCULAR | Status: DC
  Administered 2022-08-28: 40 mg via SUBCUTANEOUS

## 2022-08-28 MED ORDER — ATORVASTATIN CALCIUM 10 MG PO TABS
20.0000 mg | ORAL_TABLET | Freq: Every day | ORAL | Status: DC
  Administered 2022-08-29: 20 mg via ORAL
  Filled 2022-08-28: qty 2

## 2022-08-28 MED ORDER — LACTATED RINGERS IV SOLN: INTRAVENOUS | Status: DC

## 2022-08-28 MED ORDER — ACETAMINOPHEN 500 MG PO TABS
1000.0000 mg | ORAL_TABLET | Freq: Four times a day (QID) | ORAL | Status: DC | PRN
  Administered 2022-08-29: 1000 mg via ORAL
  Filled 2022-08-28: qty 2

## 2022-08-28 MED ORDER — SODIUM CHLORIDE 0.9 % IV BOLUS
1000.0000 mL | Freq: Once | INTRAVENOUS | Status: AC
  Administered 2022-08-28: 1000 mL via INTRAVENOUS

## 2022-08-28 MED ORDER — DICLOFENAC SODIUM 1 % EX GEL
4.0000 g | Freq: Four times a day (QID) | CUTANEOUS | Status: DC
  Filled 2022-08-28 (×2): qty 100

## 2022-08-28 MED ORDER — ASPIRIN 81 MG PO TBEC
81.0000 mg | DELAYED_RELEASE_TABLET | Freq: Every day | ORAL | Status: DC
  Administered 2022-08-29: 81 mg via ORAL
  Filled 2022-08-28: qty 1

## 2022-08-28 NOTE — ED Notes (Signed)
Lab aware of add on of TSH

## 2022-08-28 NOTE — Assessment & Plan Note (Signed)
Patient has hx of HTN, treated with norvasc and lisinopril.  - hold medications for now in setting of near syncope - prn hydralazine if >160s/90s

## 2022-08-28 NOTE — Assessment & Plan Note (Signed)
Patient had fall with lightheadedness. Patients A/C went out today. Hypotensive in the ED with positive orthostatics that responded to fluid resuscitation. Patient has noted hx of PACs and is followed by cardiology. Differential at this time is broad including hypovolemia, arrhythmia, medication effect.  - D/C maintenance fluids as patients BP was normotensive to hypertensive  - repeat EKG with echo to R/O cardiac cause - Hold hypertensive medications for now - patient has full diet and was encouraged to eat

## 2022-08-28 NOTE — Assessment & Plan Note (Signed)
Patient has hx of HTN, treated with norvasc 5 mg and lisinopril 40 mg. Patient is normotensive overnight. - will continue to hold BP meds for now

## 2022-08-28 NOTE — ED Triage Notes (Signed)
Per [facility], pt from home c/o dizziness/near syncope with standing. EMS states patient had orthostatic changes BP of 77/55 standing. Pt denies pain, denies N/V/D.   150 mL NS given in route.

## 2022-08-28 NOTE — ED Notes (Signed)
ED TO INPATIENT HANDOFF REPORT  ED Nurse Name and Phone #: Lenis Dickinson [phone]  S Name/Age/Gender Gayland Curry Barmore 82 y.o. male Room/Bed: 024C/024C  Code Status   Code Status: DNR  Home/SNF/Other Home Patient oriented to: self, place, time, and situation Is this baseline? Yes   Triage Complete: Triage complete  Chief Complaint Fall [W19.XXXA]  Triage Note Per [facility], pt from home c/o dizziness/near syncope with standing. EMS states patient had orthostatic changes BP of 77/55 standing. Pt denies pain, denies N/V/D.   150 mL NS given in route.    Allergies Allergies  Allergen Reactions   Tramadol Nausea Only   Pollen Extract Other (See Comments)    Sneezing    Level of Care/Admitting Diagnosis ED Disposition     ED Disposition  Admit   Condition  --   Comment  Hospital Area: [facility] [[id number]]  Level of Care: Telemetry Medical [104]  May place patient in observation at [facility] or [facility] if equivalent level of care is available:: No  Covid Evaluation: Asymptomatic - no recent exposure (last 10 days) testing not required  Diagnosis: Fall [290176]  Admitting Physician: Penne Lash [facility] [[id number]]  Attending Physician: Doreene Eland [[id number]]          B Medical/Surgery History Past Medical History:  Diagnosis Date   Acute nonintractable headache 05/31/2021   Alcohol abuse    Anxiety    Arthritis    hands, back, knees   Asthma    Atypical angina (HCC) (Class III) 12/19/2011   Baker's cyst of knee, right 06/21/2016   Chest pain at rest 10/04/2016   Depression    Epigastric pain 05/20/2020   Fall 10/23/2021   GERD (gastroesophageal reflux disease)    Glaucoma    Heart murmur asymptomatic   Hx of radioisotope therapy 07/26/2011   Radiosactive Prostate Seed Implant/ I-125 Seeds   Hypertension    Neck pain 10/22/2019   Prostate cancer (HCC) dx'd 2016 or 2017   seed implants   Rheumatoid arthritis (HCC) 10/10/2012   Past  Surgical History:  Procedure Laterality Date   CYSTOSCOPY  09/14/2011   Procedure: CYSTOSCOPY FLEXIBLE;  Surgeon: Lindaann Slough, MD;  Location: [facility];  Service: Urology;  Laterality: N/A;  No seeds seen in the bladder   LEFT HEART CATH AND CORONARY ANGIOGRAPHY N/A 09/16/2020   Procedure: LEFT HEART CATH AND CORONARY ANGIOGRAPHY;  Surgeon: Marykay Lex, MD;  Location: [facility] INVASIVE CV LAB;  Service: Cardiovascular;  Laterality: N/A;   LUMBAR FUSION  1995   RADIOACTIVE SEED IMPLANT  09/14/2011   Procedure: RADIOACTIVE SEED IMPLANT;  Surgeon: Lindaann Slough, MD;  Location: [facility];  Service: Urology;  Laterality: N/A;  Total number of seeds -     SHOULDER SURGERY  YRS AGO   LEFT     A IV Location/Drains/Wounds Patient Lines/Drains/Airways Status     Active Line/Drains/Airways     Name Placement date Placement time Site Days   Peripheral IV 08/28/22 20 G Left;Posterior Forearm 08/28/22  0942  Forearm  less than 1            Intake/Output Last 24 hours  Intake/Output Summary (Last 24 hours) at 08/28/2022 1632 Last data filed at 08/28/2022 1113 Gross per 24 hour  Intake --  Output 200 ml  Net -200 ml    Labs/Imaging Results for orders placed or performed during the hospital encounter of 08/28/22 (from the past 48 hour(s))  Brain  natriuretic peptide     Status: None   Collection Time: 08/28/22  9:53 AM  Result Value Ref Range   B Natriuretic Peptide 32.1 0.0 - 100.0 pg/mL    Comment: Performed at [facility], [address] [place], [place] [address]  CBC with Differential     Status: Abnormal   Collection Time: 08/28/22  9:53 AM  Result Value Ref Range   WBC 4.7 4.0 - 10.5 K/uL   RBC 4.44 4.22 - 5.81 MIL/uL   Hemoglobin 12.6 (L) 13.0 - 17.0 g/dL   HCT 03.4 74.2 - 59.5 %   MCV 89.2 80.0 - 100.0 fL   MCH 28.4 26.0 - 34.0 pg   MCHC 31.8 30.0 - 36.0 g/dL   RDW 63.8 75.6 - 43.3 %   Platelets 210 150 - 400 K/uL   nRBC 0.0 0.0  - 0.2 %   Neutrophils Relative % 79 %   Neutro Abs 3.7 1.7 - 7.7 K/uL   Lymphocytes Relative 12 %   Lymphs Abs 0.6 (L) 0.7 - 4.0 K/uL   Monocytes Relative 9 %   Monocytes Absolute 0.4 0.1 - 1.0 K/uL   Eosinophils Relative 0 %   Eosinophils Absolute 0.0 0.0 - 0.5 K/uL   Basophils Relative 0 %   Basophils Absolute 0.0 0.0 - 0.1 K/uL   Immature Granulocytes 0 %   Abs Immature Granulocytes 0.01 0.00 - 0.07 K/uL    Comment: Performed at [facility], [address] [place], [place] [address]  Urinalysis, Routine w reflex microscopic -Urine, Clean Catch     Status: Abnormal   Collection Time: 08/28/22  9:53 AM  Result Value Ref Range   Color, Urine STRAW (A) YELLOW   APPearance CLEAR CLEAR   Specific Gravity, Urine 1.010 1.005 - 1.030   pH 7.0 5.0 - 8.0   Glucose, UA NEGATIVE NEGATIVE mg/dL   Hgb urine dipstick NEGATIVE NEGATIVE   Bilirubin Urine NEGATIVE NEGATIVE   Ketones, ur NEGATIVE NEGATIVE mg/dL   Protein, ur NEGATIVE NEGATIVE mg/dL   Nitrite NEGATIVE NEGATIVE   Leukocytes,Ua NEGATIVE NEGATIVE    Comment: Performed at [facility], [address], [place], [place] [address]  Comprehensive metabolic panel     Status: Abnormal   Collection Time: 08/28/22 11:45 AM  Result Value Ref Range   Sodium 140 135 - 145 mmol/L   Potassium 4.2 3.5 - 5.1 mmol/L   Chloride 110 98 - 111 mmol/L   CO2 23 22 - 32 mmol/L   Glucose, Bld 100 (H) 70 - 99 mg/dL    Comment: Glucose reference range applies only to samples taken after fasting for at least 8 hours.   BUN 11 8 - 23 mg/dL   Creatinine, Ser 0.63 0.61 - 1.24 mg/dL   Calcium 8.6 (L) 8.9 - 10.3 mg/dL   Total Protein 6.3 (L) 6.5 - 8.1 g/dL   Albumin 3.7 3.5 - 5.0 g/dL   AST 23 15 - 41 U/L   ALT 20 0 - 44 U/L   Alkaline Phosphatase 56 38 - 126 U/L   Total Bilirubin 0.6 0.3 - 1.2 mg/dL   GFR, Estimated >01 >60 mL/min    Comment: (NOTE) Calculated using the CKD-EPI Creatinine Equation (2021)    Anion gap 7 5 - 15     Comment: Performed at [facility], [address] [place], [place] [address]   DG Chest 2 View  Result Date: 08/28/2022 CLINICAL DATA:  Near syncope. EXAM: CHEST - 2 VIEW COMPARISON:  September 01, 2021. FINDINGS: The heart size and mediastinal contours are within normal limits. Left lung is clear. Minimal right basilar subsegmental atelectasis. The visualized skeletal structures are unremarkable. IMPRESSION: Minimal right basilar subsegmental atelectasis. Electronically Signed   By: Lupita Raider M.D.   On: 08/28/2022 12:02    Pending Labs Unresulted Labs (From admission, onward)     Start     Ordered   08/29/22 0500  Basic metabolic panel  Tomorrow morning,   R        08/28/22 1535   08/29/22 0500  CBC  Tomorrow morning,   R        08/28/22 1535   08/28/22 1535  TSH  Once,   R        08/28/22 1535            Vitals/Pain Today's Vitals   08/28/22 1400 08/28/22 1430 08/28/22 1500 08/28/22 1530  BP: (!) 131/93 (!) 144/91 (!) 131/105 (!) 150/106  Pulse: (!) 114 82 88 67  Resp: 16 16 (!) 21 19  Temp:      TempSrc:      SpO2: 99% 95% 100% 100%  Weight:      Height:      PainSc:        Isolation Precautions No active isolations  Medications Medications  lactated ringers infusion ( Intravenous New Bag/Given 08/28/22 1425)  atorvastatin (LIPITOR) tablet 20 mg (has no administration in time range)  enoxaparin (LOVENOX) injection 40 mg (has no administration in time range)  sodium chloride 0.9 % bolus 1,000 mL (0 mLs Intravenous Stopped 08/28/22 1134)    Mobility walks     Focused Assessments Neuro Assessment Handoff:  Swallow screen pass?  N/a Cardiac Rhythm: Atrial fibrillation       Neuro Assessment: Exceptions to WDL (pt c/o of dizziness, near syncope with standing) Neuro Checks:      Has TPA been given? No If patient is a Neuro Trauma and patient is going to OR before floor call report to 4N Charge nurse: [phone] or  [phone]   R Recommendations: See Admitting Provider Note  Report given to:   Additional Notes: Pt being admitted d/t being slightly orthostatic.  Pt reports feeling much better since arrival.

## 2022-08-28 NOTE — Progress Notes (Signed)
FMTS Brief Progress Note  S: Went to assess patient at bedside with Dr. Fatima Blank.  Patient denies any dizziness or issues at this time.  Felt very hot earlier today and dizzy when standing up.  He has not been up much today due to fall precautions.  Discussed that he can ask nursing if needing to get up ground.   O: BP 109/63 (BP Location: Right Arm)   Pulse 76   Temp 98.4 F (36.9 C)   Resp 18   Ht 5\' 10"  (1.778 m)   Wt 80.7 kg   SpO2 98%   BMI 25.54 kg/m     A/P: Syncope Likely secondary to hypovolemia and using antihypertensives.  S/p 1 L NS in the ED.  Last blood pressure 109/63.  Mentating well.  Denies any chest pain or palpitations.  Troponins negative 6>7. Initial EKG with PACs - repeat EKG AM - Holding home blood pressure medicines for now - Consider fluids if patient becomes hypotensive - Rest of plan per H&P - Orders reviewed. Labs for AM ordered, which was adjusted as needed.    Levin Erp, MD 08/28/2022, 9:19 PM PGY-3, [facility] Night Resident  Please page [phone] with questions.

## 2022-08-28 NOTE — ED Notes (Addendum)
Transport RNs called and confirmed 2W was ready for the Pt.

## 2022-08-28 NOTE — ED Provider Notes (Signed)
[place] EMERGENCY DEPARTMENT AT [facility] Provider Note   CSN: [id number] Arrival date & time: 08/28/22  0930     History  Chief Complaint  Patient presents with   Near Syncope   Hypotension    Joel Donaldson is a 82 y.o. male.  HPI Patient arrives via EMS with concern for near syncope, lightheadedness. Patient notes that today he stood up, felt lightheaded, did not fall, did not lose consciousness.  Episode was reminiscent of a similar 1 month ago when he did fall, after losing consciousness. He checked his vital signs at home, found himself to be hypotensive and called EMS. Currently no complaints while laying in a supine positioning. He notes that he has been taking his medication as directed. EMS reports that on arrival patient was hypertensive, systolic 70, but the patient was mentating appropriately.  He received fluids en route, had improvement in his blood pressure, decrease in tachycardia.    Home Medications Prior to Admission medications   Medication Sig Start Date End Date Taking? Authorizing Provider  acetaminophen (TYLENOL) 500 MG tablet Take 2 tablets (1,000 mg total) by mouth every 6 (six) hours as needed. 10/14/21   Arby Barrette, MD  albuterol (VENTOLIN HFA) 108 (90 Base) MCG/ACT inhaler INHALE 1 TO 2 PUFFS INTO THE LUNGS EVERY 4 HOURS AS NEEDED FOR WHEEZING OR SHORTNESS OF BREATH 05/28/22   Levin Erp, MD  amLODipine (NORVASC) 5 MG tablet Take 1 tablet (5 mg total) by mouth daily. 05/28/22 05/23/23  Levin Erp, MD  aspirin EC 81 MG tablet Take 81 mg by mouth every morning.     [provider]  atorvastatin (LIPITOR) 20 MG tablet TAKE 1 TABLET(20 MG) BY MOUTH DAILY 07/16/22   Croitoru, Mihai, MD  bacitracin ointment Apply 1 Application topically 2 (two) times daily. 10/14/21   Arby Barrette, MD  buprenorphine Lavera Guise) 7.5 MCG/HR Place 1 patch onto the skin every Thursday. 05/17/20   [provider]  diclofenac Sodium  (VOLTAREN) 1 % GEL Apply 4 g topically 4 (four) times daily. 01/10/21   Brimage, Seward Meth, DO  lidocaine (XYLOCAINE) 5 % ointment Apply 1 application topically daily as needed for mild pain or moderate pain. 01/10/21   Brimage, Seward Meth, DO  lisinopril (ZESTRIL) 20 MG tablet TAKE 2 TABLETS(40MG ) BY MOUTH DAILY 07/18/22   Levin Erp, MD  Multiple Vitamins-Minerals (ONE-A-DAY PROACTIVE 65+) TABS Take 1 tablet by mouth daily with breakfast.    [provider]  nitroGLYCERIN (NITROSTAT) 0.4 MG SL tablet Place 1 tablet (0.4 mg total) under the tongue every 5 (five) minutes as needed for chest pain (chest pain). 05/25/21   Katha Cabal, DO  oxyCODONE-acetaminophen (PERCOCET) 10-325 MG tablet  02/26/22   [provider]  polyethylene glycol (MIRALAX / GLYCOLAX) 17 g packet Take 17 g by mouth daily as needed for mild constipation. 01/10/21   Brimage, Seward Meth, DO  umeclidinium bromide (INCRUSE ELLIPTA) 62.5 MCG/ACT AEPB Inhale 1 puff into the lungs daily. 05/28/22   Levin Erp, MD      Allergies    Tramadol and Pollen extract    Review of Systems   Review of Systems  All other systems reviewed and are negative.   Physical Exam Updated Vital Signs BP 138/80   Pulse 78   Temp 98.1 F (36.7 C)   Resp 18   Ht 5\' 10"  (1.778 m)   Wt 80.7 kg   SpO2 99%   BMI 25.54 kg/m  Physical Exam Vitals and nursing  note reviewed.  Constitutional:      General: He is not in acute distress.    Appearance: He is well-developed.  HENT:     Head: Normocephalic and atraumatic.  Eyes:     Conjunctiva/sclera: Conjunctivae normal.  Cardiovascular:     Rate and Rhythm: Normal rate and regular rhythm.  Pulmonary:     Effort: Pulmonary effort is normal. No respiratory distress.     Breath sounds: No stridor.  Abdominal:     General: There is no distension.  Skin:    General: Skin is warm and dry.  Neurological:     Mental Status: He is alert and oriented to person, place, and time.      ED Results / Procedures / Treatments   Labs (all labs ordered are listed, but only abnormal results are displayed) Labs Reviewed  CBC WITH DIFFERENTIAL/PLATELET - Abnormal; Notable for the following components:      Result Value   Hemoglobin 12.6 (*)    Lymphs Abs 0.6 (*)    All other components within normal limits  URINALYSIS, ROUTINE W REFLEX MICROSCOPIC - Abnormal; Notable for the following components:   Color, Urine STRAW (*)    All other components within normal limits  COMPREHENSIVE METABOLIC PANEL - Abnormal; Notable for the following components:   Glucose, Bld 100 (*)    Calcium 8.6 (*)    Total Protein 6.3 (*)    All other components within normal limits  BRAIN NATRIURETIC PEPTIDE    EKG EKG Interpretation Date/Time:  Tuesday August 28 2022 09:35:50 EDT Ventricular Rate:  79 PR Interval:  192 QRS Duration:  81 QT Interval:  406 QTC Calculation: 466 R Axis:   -5  Text Interpretation: Sinus rhythm Atrial premature complex Artifact Abnormal ECG Confirmed by Gerhard Munch [phone]) on 08/28/2022 10:06:13 AM  Radiology DG Chest 2 View  Result Date: 08/28/2022 CLINICAL DATA:  Near syncope. EXAM: CHEST - 2 VIEW COMPARISON:  September 01, 2021. FINDINGS: The heart size and mediastinal contours are within normal limits. Left lung is clear. Minimal right basilar subsegmental atelectasis. The visualized skeletal structures are unremarkable. IMPRESSION: Minimal right basilar subsegmental atelectasis. Electronically Signed   By: Lupita Raider M.D.   On: 08/28/2022 12:02    Procedures Procedures    Medications Ordered in ED Medications  sodium chloride 0.9 % bolus 1,000 mL (0 mLs Intravenous Stopped 08/28/22 1134)    ED Course/ Medical Decision Making/ A&P                             Medical Decision Making Adult male presents with concern for near syncope, hypotension.  Differential includes hypovolemia, arrhythmia, ACS, though his initial exam is reassuring. Cardiac  monitor 80 sinus normal Pulse ox 100% room air normal   Amount and/or Complexity of Data Reviewed Independent Historian: EMS Labs: ordered. Decision-making details documented in ED Course. Radiology: ordered and independent interpretation performed. Decision-making details documented in ED Course. ECG/medicine tests: ordered. Decision-making details documented in ED Course.  Risk Prescription drug management. Decision regarding hospitalization. Diagnosis or treatment significantly limited by social determinants of health.   Update: Patient awake, alert, feeling better.  Blood pressure has normalized, remained so, without arrhythmia 4 hours of monitoring in the emergency department. Patient's labs generally reassuring, mild hyperglycemia, no anion gap acidosis, and some suspicion for dehydration contributing to his episode of near syncope, with hypotension.  With fluid resuscitation, patient had orthostatic testing,  and had substantial drop with upright positioning. Given his persistent tachycardia, near syncope, with episode earlier in the day as well, patient will be admitted for ongoing fluid resuscitation. No early evidence for concurrent phenomena, bacteremia, sepsis.  No neurodeficits suggesting CNS dysfunction.         Final Clinical Impression(s) / ED Diagnoses Final diagnoses:  Near syncope  Dehydration     Gerhard Munch, MD 08/28/22 1430

## 2022-08-28 NOTE — Hospital Course (Addendum)
Joel Donaldson is a 82 y.o.male with a history of alcohol abuse, angina, asthma, HTN, prostate cancer, RA who was admitted to the [facility] Medicine Teaching Service at [facility] for near syncope and hypotension. His hospital course is detailed below:  Near Syncope/Orthostatic Hypotension Patient had a fall with lightheadedness after his A/C went out today. Was hypotensive in the ED and responded to fluid resuscitation. Anti-hypertensive medications were held upon admission and restarted upon discharge. Repeat EKG showed sinus arrhyhtmia which is consistent with his diagnosed PACs with no signs of ischemia.This was likely 2/2 to dehydration as he improved with rehydration overnight.    Other chronic conditions were medically managed with home medications and formulary alternatives as necessary:  Hypertension - Norvasc 5 mg and Lisinopril 40 mg held upon admission due near syncopal episode   PCP Follow-up Recommendations: Held BP medications (Norvasc and lisinopril) on discharge. Please titrate on follow up.  Outpatient echo to f/u on PACs, patient is established with cardiology Patient on oxycodone, consider redosing d/t dizziness

## 2022-08-28 NOTE — ED Notes (Signed)
Hospitalist team at bedside.

## 2022-08-28 NOTE — ED Notes (Signed)
Lab informed to add troponin level

## 2022-08-28 NOTE — H&P (Addendum)
Hospital Admission History and Physical Service Pager: [phone]  Patient name: Joel Donaldson Medical record number: [id number] Date of Birth: [birth year] Age: 82 y.o. Gender: male  Primary Care Provider: Levin Erp, MD Consultants: None Code Status: DNR Preferred Emergency Contact:  Contact Information     Name Relation Home Work [place] Niece   [phone]      Other Contacts     Name Relation Home Work Mobile   [employer] Spouse [phone]   Hood,Gloria Neighbor   [phone]        Chief Complaint: Near Syncope   Assessment and Plan: Joel Donaldson is a 82 y.o. male presenting after fall. Differential for presentation of this includes hypovolemia, arrhythmia, medication effect.   Most likely the patients presentation is multifactorial with volume depletion and over treatment of his hypertension. He is s/p 1 L fluid in the ED with good blood pressure response. Will hold additional bolus at this time and repeat orthostatics tomorrow morning.   Will also rule out other etiologies of fall with echocardiogram and cardiac monitoring. Of note, he has a documented history of PACs and his rhythm was noted to be irregular on exam. If echocardiogram is abnormal and he does not improve with fluid resuscitation can consider cardiology consult tomorrow morning.   -      Hospital     Near syncope     Patient had fall with lightheadedness. Patients A/C went out today.  Hypotensive in the ED with positive orthostatics that responded to fluid  resuscitation. Patient has noted hx of PACs and is followed by cardiology.  Differential at this time is broad including hypovolemia, arrhythmia,  medication effect.  - Admit to FMTS, attending Dr. Lum Babe  - Med-tele, Vital signs per floor - Regular diet  - PT to treat - VTE prophylaxis: Lovenox  - AM CBC/BMP  - Fall precautions - D/C maintenance fluids  - repeat EKG with echo to  R/O cardiac cause - Hold hypertensive medications for now - repeat orthostatics tomorrow s/p fluids         HTN (hypertension)     Patient has hx of HTN, treated with norvasc 5 mg and lisinopril 40 mg.  - hold medications for now in setting of near syncope - prn hydralazine if >160s/90s        Fall     Orthostatic hypotension   FEN/GI: Full VTE Prophylaxis: Lovenox  Disposition: Med Tele  History of Present Illness:  Joel Donaldson is a 82 y.o. male presenting after fall with lightheadedness. Patient reports his A/C stopped working today and he was sitting on the edge of the bed when he started to feel dizzy and lightheaded and slid to the floor. He reports he was immediately able to get back up. He reports he did not have associated chest pain, SOB, changes in vision or hearing. He reports he had associated headache which was at the top of his head and felt like a normal headache. He denies any recent fever, diarrhea, vomiting. He denies dysuria or trouble urinating.   He states he eats and drinks well and has medical aide who helps cook.   He reports a similar episode a month ago but reports he did not do anything about it.   He reports he takes his medication as instructed and takes oxycodone for knee and shoulder pain - takes 2-4 times per day (3 pills total during the day). He reports he  took his BP medications today.    In the ED, patient was given 1L NS bolus and started on 2x mIVF. Orthostatics were positive.  CBC and BMP showed no abnormalities.   Review Of Systems: Per HPI with the following additions: as above   Pertinent Past Medical History: HTN Atypical Angina, followed by cardiology Anxiety Headaches Remainder reviewed in history tab.   Pertinent Past Surgical History: L shoulder surgery L heart Cath 2022 Remainder reviewed in history tab.   Pertinent Social History: Tobacco use: Former, 14 years, quit 15 years ago Alcohol use: socially  Other Substance  use: denies  Lives with granddaughter   Pertinent Family History: Remainder reviewed in history tab.   Important Outpatient Medications: Amlodipine 5 mg daily Lisinopril 40mg  daily Lipitor 20 mg daily Aspirin 81 mg daily Percocet 10-325mg  2-3 daily  Remainder reviewed in medication history.   Objective: BP (!) 127/96   Pulse 89   Temp 98.1 F (36.7 C) (Oral)   Resp (!) 25   Ht 5\' 10"  (1.778 m)   Wt 80.7 kg   SpO2 100%   BMI 25.54 kg/m  Exam: General: Older gentleman, laying comfortably in bed  ENTM: Poor dentition  Cardiovascular: regular rate, abnormal rhythm with no M/R/G  Respiratory: NWOB Gastrointestinal: soft, NTND MSK: decreased muscle mass on bilateral calves, moves all extremities Neuro: A&Ox4 Psych: pleasant   Labs:  CBC BMET  Recent Labs  Lab 08/28/22 0953  WBC 4.7  HGB 12.6*  HCT 39.6  PLT 210   Recent Labs  Lab 08/28/22 1145  NA 140  K 4.2  CL 110  CO2 23  BUN 11  CREATININE 0.93  GLUCOSE 100*  CALCIUM 8.6*     BNP: 32.1   EKG: My own interpretation (not copied from electronic read)   EKG: regular rate, some PACs noted  Imaging Studies Performed: Chest Xray: some minimal R atelectasis, otherwise normal xray   Glendale Chard, DO 08/28/2022, 5:18 PM PGY-2, [facility]  FPTS Intern pager: [phone], text pages welcome Secure chat group [facility] Teaching Service

## 2022-08-28 NOTE — Assessment & Plan Note (Signed)
Patient had fall with lightheadedness. Patients A/C went out today. Hypotensive in the ED with positive orthostatics that responded to fluid resuscitation. Patient has noted hx of PACs and is followed by cardiology. Repeat EKG was normal.  - Consider echo o/p - Fall precautions - Hold hypertensive medications for now - repeat orthostatics today

## 2022-08-28 NOTE — ED Notes (Signed)
Help get patient into a gown on the monitor did EKG shown to Dr Jeraldine Loots patient is resting with call bell in reach

## 2022-08-29 ENCOUNTER — Observation Stay (HOSPITAL_COMMUNITY): Payer: 59

## 2022-08-29 DIAGNOSIS — Z79899 Other long term (current) drug therapy: Secondary | ICD-10-CM | POA: Diagnosis not present

## 2022-08-29 DIAGNOSIS — J45909 Unspecified asthma, uncomplicated: Secondary | ICD-10-CM | POA: Diagnosis not present

## 2022-08-29 DIAGNOSIS — I10 Essential (primary) hypertension: Secondary | ICD-10-CM | POA: Diagnosis not present

## 2022-08-29 DIAGNOSIS — Z87891 Personal history of nicotine dependence: Secondary | ICD-10-CM | POA: Diagnosis not present

## 2022-08-29 DIAGNOSIS — R55 Syncope and collapse: Secondary | ICD-10-CM | POA: Diagnosis not present

## 2022-08-29 DIAGNOSIS — E860 Dehydration: Secondary | ICD-10-CM | POA: Diagnosis not present

## 2022-08-29 DIAGNOSIS — Z7982 Long term (current) use of aspirin: Secondary | ICD-10-CM | POA: Diagnosis not present

## 2022-08-29 LAB — CBC
HCT: 36.4 % — ABNORMAL LOW (ref 39.0–52.0)
Hemoglobin: 11.6 g/dL — ABNORMAL LOW (ref 13.0–17.0)
MCH: 29.4 pg (ref 26.0–34.0)
MCHC: 31.9 g/dL (ref 30.0–36.0)
MCV: 92.4 fL (ref 80.0–100.0)
Platelets: 187 10*3/uL (ref 150–400)
RBC: 3.94 MIL/uL — ABNORMAL LOW (ref 4.22–5.81)
RDW: 13.4 % (ref 11.5–15.5)
WBC: 4.8 10*3/uL (ref 4.0–10.5)
nRBC: 0 % (ref 0.0–0.2)

## 2022-08-29 LAB — BASIC METABOLIC PANEL
Anion gap: 6 (ref 5–15)
BUN: 9 mg/dL (ref 8–23)
CO2: 23 mmol/L (ref 22–32)
Calcium: 8.5 mg/dL — ABNORMAL LOW (ref 8.9–10.3)
Chloride: 109 mmol/L (ref 98–111)
Creatinine, Ser: 0.85 mg/dL (ref 0.61–1.24)
GFR, Estimated: >60 mL/min (ref >60)
Glucose, Bld: 100 mg/dL — ABNORMAL HIGH (ref 70–99)
Potassium: 3.6 mmol/L (ref 3.5–5.1)
Sodium: 138 mmol/L (ref 135–145)

## 2022-08-29 MED ORDER — LISINOPRIL 20 MG PO TABS
ORAL_TABLET | ORAL | 0 refills | Status: DC
Start: 2022-08-29 — End: 2022-09-05

## 2022-08-29 MED ORDER — AMLODIPINE BESYLATE 5 MG PO TABS
ORAL_TABLET | ORAL | 3 refills | Status: DC
Start: 2022-08-29 — End: 2022-09-05

## 2022-08-29 NOTE — Discharge Instructions (Signed)
Dear Joel Donaldson,  Thank you for letting us participate in your care. You were hospitalized for lightheadedness and a fall and diagnosed with Fall. You were treated with fluid resuscitation and holding your BP medications.    POST-HOSPITAL & CARE INSTRUCTIONS Please do NOT take your BP medications until you follow up with your PCP Please schedule a follow up appointment with your cardiologist for an echocardiogram Go to your follow up appointments (listed below)   DOCTOR'S APPOINTMENT   Future Appointments  Date Time Provider Department Center  09/20/2022  2:50 PM Levin Erp, MD [facility]  09/24/2022 10:45 AM Felecia Shelling, DPM [facility]  08/19/2023  8:45 AM [facility] ANNUAL WELLNESS VISIT [facility]    Follow-up Information     Levin Erp, MD Follow up.   Specialty: Family Medicine Why: Please go to your scheduled appointment with Dr. Laroy Apple on 8/8 at 2:50 PM. Contact information: [address] [place] [address] [phone]                 Take care and be well!  Family Medicine Teaching Service Inpatient Team [place]  [facility]  [address] [place], [place] [address] [phone]

## 2022-08-29 NOTE — Discharge Summary (Addendum)
Family Medicine Teaching [facility] Discharge Summary  Patient name: Joel Donaldson Medical record number: [id number] Date of birth: 01/05/1941 Age: 82 y.o. Gender: male Date of Admission: 08/28/2022  Date of Discharge: 08/29/22  Admitting Physician: Mahnoor Jeni Salles, MD  Primary Care Provider: Levin Erp, MD Consultants: None  Indication for Hospitalization: [facility] Course:  Joel Donaldson is a 82 y.o.male with a history of alcohol abuse, angina, asthma, HTN, prostate cancer, RA who was admitted to the [facility] Medicine Teaching Service at [facility] for near syncope and hypotension. His hospital course is detailed below:  Near Syncope/Orthostatic Hypotension Patient had a fall with lightheadedness after his A/C went out at home. Was hypotensive in the ED and responded to fluid resuscitation. Anti-hypertensive medications were held upon admission and not restarted upon discharge. EKG without evidence of arrhythmia. Offered echocardiogram in the hospital but patient deferred to outpatient follow-up. This was likely 2/2 to dehydration as he improved with rehydration overnight.    Other chronic conditions were medically managed with home medications and formulary alternatives as necessary:  Hypertension - Norvasc 5 mg and Lisinopril 40 mg held upon admission due near syncopal episode   PCP Follow-up Recommendations: Held BP medications (Norvasc and lisinopril) on discharge. Please titrate on follow up.   Recommend outpatient echo, patient is established with cardiology Patient on oxycodone and buprenorphine patch, consider deprescribing of opioids as these could be contributing   Discharge Diagnoses/Problem List:  Principal Problem:   Fall Active Problems:   HTN (hypertension)    Disposition: Home  Discharge Condition: Stable  Discharge Exam: General: Age appropriate, poor dentition, pleasant Cardiovascular: RRR, no m/r/g Respiratory: NWOB on  RA Abdomen: soft, NTND Extremities: moves all extremities appropriately    Significant Procedures: none  Significant Labs and Imaging:  Recent Labs  Lab 08/28/22 0953 08/29/22 0029  WBC 4.7 4.8  HGB 12.6* 11.6*  HCT 39.6 36.4*  PLT 210 187   Recent Labs  Lab 08/28/22 1145 08/29/22 0029  NA 140 138  K 4.2 3.6  CL 110 109  CO2 23 23  GLUCOSE 100* 100*  BUN 11 9  CREATININE 0.93 0.85  CALCIUM 8.6* 8.5*  ALKPHOS 56  --   AST 23  --   ALT 20  --   ALBUMIN 3.7  --       Results/Tests Pending at Time of Discharge: none  Discharge Medications:  Allergies as of 08/29/2022       Reactions   Tramadol Nausea Only   Pollen Extract Other (See Comments)   Sneezing        Medication List     TAKE these medications    acetaminophen 500 MG tablet Commonly known as: TYLENOL Take 2 tablets (1,000 mg total) by mouth every 6 (six) hours as needed. What changed: reasons to take this   albuterol 108 (90 Base) MCG/ACT inhaler Commonly known as: VENTOLIN HFA INHALE 1 TO 2 PUFFS INTO THE LUNGS EVERY 4 HOURS AS NEEDED FOR WHEEZING OR SHORTNESS OF BREATH What changed:  how much to take how to take this when to take this reasons to take this additional instructions   amLODipine 5 MG tablet Commonly known as: NORVASC Do not take until you see [place] in clinic What changed:  how much to take how to take this when to take this additional instructions   aspirin EC 81 MG tablet Take 81 mg by mouth every morning.   atorvastatin 20 MG tablet Commonly  known as: LIPITOR TAKE 1 TABLET(20 MG) BY MOUTH DAILY What changed: See the new instructions.   bacitracin ointment Apply 1 Application topically 2 (two) times daily.   buprenorphine 7.5 MCG/HR Commonly known as: BUTRANS Place 1 patch onto the skin every Thursday.   diclofenac Sodium 1 % Gel Commonly known as: VOLTAREN Apply 4 g topically 4 (four) times daily.   Incruse Ellipta 62.5 MCG/ACT Aepb Generic drug:  umeclidinium bromide Inhale 1 puff into the lungs daily.   lidocaine 5 % ointment Commonly known as: XYLOCAINE Apply 1 application topically daily as needed for mild pain or moderate pain.   lisinopril 20 MG tablet Commonly known as: ZESTRIL Stop taking your lisinopril until you see [place] in clinic What changed: additional instructions   nitroGLYCERIN 0.4 MG SL tablet Commonly known as: NITROSTAT Place 1 tablet (0.4 mg total) under the tongue every 5 (five) minutes as needed for chest pain (chest pain).   omeprazole 40 MG capsule Commonly known as: PRILOSEC Take 40 mg by mouth daily.   One-A-Day Proactive 65+ Tabs Take 1 tablet by mouth daily with breakfast.   oxyCODONE-acetaminophen 10-325 MG tablet Commonly known as: PERCOCET Take 1 tablet by mouth every 4 (four) hours as needed for pain.   polyethylene glycol 17 g packet Commonly known as: MIRALAX / GLYCOLAX Take 17 g by mouth daily as needed for mild constipation.        Discharge Instructions: Please refer to Patient Instructions section of EMR for full details.  Patient was counseled important signs and symptoms that should prompt return to medical care, changes in medications, dietary instructions, activity restrictions, and follow up appointments.   Follow-Up Appointments:  Follow-up Information     Levin Erp, MD Follow up.   Specialty: Family Medicine Why: Please go to your scheduled appointment with Dr. Laroy Apple on 8/8 at 2:50 PM. Contact information: [address] [place] [address] [phone]                 Cyd Silence, MD 08/29/2022, 11:24 AM PGY-1, [facility] Family Medicine    I have evaluated this patient along with Dr. Georg Ruddle and reviewed the above note, making necessary revisions.  Dorothyann Gibbs, MD 08/29/2022, 11:26 AM PGY-3, [facility] Family Medicine

## 2022-08-29 NOTE — Plan of Care (Signed)

## 2022-09-04 ENCOUNTER — Ambulatory Visit (INDEPENDENT_AMBULATORY_CARE_PROVIDER_SITE_OTHER): Payer: 59 | Admitting: Student

## 2022-09-04 ENCOUNTER — Other Ambulatory Visit: Payer: Self-pay

## 2022-09-04 VITALS — BP 126/61 | HR 71 | Ht 70.0 in | Wt 169.4 lb

## 2022-09-04 DIAGNOSIS — R55 Syncope and collapse: Secondary | ICD-10-CM

## 2022-09-04 DIAGNOSIS — I959 Hypotension, unspecified: Secondary | ICD-10-CM

## 2022-09-04 NOTE — Patient Instructions (Addendum)
It was great to see you today! Thank you for choosing [facility] for your primary care. Joel Donaldson was seen for follow up.  Today we addressed: We will obtain an echo, ordering today  I would like you to call cardiology today [phone].   I will also order PT, they will call you Call us with any worsening symptoms   If you haven't already, sign up for My Chart to have easy access to your labs results, and communication with your primary care physician.  I recommend that you always bring your medications to each appointment as this makes it easy to ensure you are on the correct medications and helps [place] not miss refills when you need them. Call the clinic at [phone] if your symptoms worsen or you have any concerns.  You should return to our clinic Return in about 16 days (around 09/20/2022). Please arrive 15 minutes before your appointment to ensure smooth check in process.  We appreciate your efforts in making this happen.  Thank you for allowing me to participate in your care, Joel Martinez, MD 09/04/2022, 2:46 PM PGY-3, [facility] Family Medicine

## 2022-09-04 NOTE — Progress Notes (Unsigned)
  SUBJECTIVE:   CHIEF COMPLAINT / HPI:   Hypotensive Episode:  Patient presenting to access to care with what seems like a hospital follow-up although not clear.  Will treat this as a hospital follow-up.  Patient had a fall with lightheadedness after his AC broke at home.  He was hypotensive in the ED and responded to fluid resuscitation.  They held his antihypertensive medications on admission to the [facility] service.  He had an EKG that showed no evidence of arrhythmia and was offered echo at hospital but declined this inpatient.   Reports SBP at home 105/70s. Headache when he notes a low pressure. Sometimes still feels dizziness at home, intermittent. No falls or syncopal episodes since visit to hospital.   Cardiologist via [facility], no chest pain, dyspnea, heart palpitations.   Using oxycodone and Buprenorphine patch for about a year.   Had Zio this year, had 19 runs of Vtach, no evidence of fibrillation.   PERTINENT  PMH / PSH:  Aortic atherosclerosis Hypertension COPD GERD Osteoarthritis Rheumatoid arthritis Prostate cancer history Cataracts Hyperlipidemia   Patient Care Team: Levin Erp, MD as PCP - General (Family Medicine) Croitoru, Rachelle Hora, MD as PCP - Cardiology (Cardiology) Ernesto Rutherford, MD (Ophthalmology) Dr. Carrie Mew (Rheumatology) Dr. Gilford Raid (Oncology) OBJECTIVE:  There were no vitals taken for this visit. Physical Exam  General: Alert and oriented in no apparent distress Heart: Regular rate and rhythm with no murmurs appreciated Lungs: CTA bilaterally, no wheezing Abdomen: Bowel sounds present, no abdominal pain Skin: Warm and dry Extremities: No lower extremity edema  ASSESSMENT/PLAN:  There are no diagnoses linked to this encounter. No follow-ups on file. Joel Martinez, MD 09/04/2022, 2:18 PM PGY-3, [facility] Family Medicine {    This will disappear when note is signed, click to select method of visit    :1}

## 2022-09-05 NOTE — Assessment & Plan Note (Addendum)
Stop all antihypertensives unless otherwise directed.  Given patient's history and irregular rhythm on exam when admitted to the hospital although regular rate and rhythm today, will order an echocardiogram outpatient and have instructed him to call his cardiologist today.  Patient is amenable to this.  I have provided him with information for heart care to call.  Remain hydrated.  Provided strict fall precautions and return criteria.  Seems to be deconditioned, would benefit from physical therapy assessment and assistance to improve safety when moving around the house.  Placed order for physical therapy and patient is amenable to this.

## 2022-09-14 DIAGNOSIS — M545 Low back pain, unspecified: Secondary | ICD-10-CM | POA: Diagnosis not present

## 2022-09-14 DIAGNOSIS — G894 Chronic pain syndrome: Secondary | ICD-10-CM | POA: Diagnosis not present

## 2022-09-14 DIAGNOSIS — Z9181 History of falling: Secondary | ICD-10-CM | POA: Diagnosis not present

## 2022-09-14 DIAGNOSIS — Z6824 Body mass index (BMI) 24.0-24.9, adult: Secondary | ICD-10-CM | POA: Diagnosis not present

## 2022-09-14 DIAGNOSIS — G8929 Other chronic pain: Secondary | ICD-10-CM | POA: Diagnosis not present

## 2022-09-14 DIAGNOSIS — R7303 Prediabetes: Secondary | ICD-10-CM | POA: Diagnosis not present

## 2022-09-14 DIAGNOSIS — E559 Vitamin D deficiency, unspecified: Secondary | ICD-10-CM | POA: Diagnosis not present

## 2022-09-14 DIAGNOSIS — M25562 Pain in left knee: Secondary | ICD-10-CM | POA: Diagnosis not present

## 2022-09-14 DIAGNOSIS — Z79899 Other long term (current) drug therapy: Secondary | ICD-10-CM | POA: Diagnosis not present

## 2022-09-14 DIAGNOSIS — M25561 Pain in right knee: Secondary | ICD-10-CM | POA: Diagnosis not present

## 2022-09-19 ENCOUNTER — Telehealth: Payer: Self-pay | Admitting: Nurse Practitioner

## 2022-09-19 ENCOUNTER — Other Ambulatory Visit: Payer: Self-pay | Admitting: Student

## 2022-09-19 DIAGNOSIS — H34211 Partial retinal artery occlusion, right eye: Secondary | ICD-10-CM

## 2022-09-19 DIAGNOSIS — J449 Chronic obstructive pulmonary disease, unspecified: Secondary | ICD-10-CM

## 2022-09-19 DIAGNOSIS — H04123 Dry eye syndrome of bilateral lacrimal glands: Secondary | ICD-10-CM | POA: Diagnosis not present

## 2022-09-19 DIAGNOSIS — H31091 Other chorioretinal scars, right eye: Secondary | ICD-10-CM | POA: Diagnosis not present

## 2022-09-19 DIAGNOSIS — H10413 Chronic giant papillary conjunctivitis, bilateral: Secondary | ICD-10-CM | POA: Diagnosis not present

## 2022-09-19 DIAGNOSIS — Z961 Presence of intraocular lens: Secondary | ICD-10-CM | POA: Diagnosis not present

## 2022-09-19 NOTE — Telephone Encounter (Signed)
Pt came by with his optometrist card for provider/nurse to call about a blodd vessel in his eye. He said it was urgent.   Groat Valinda Hoar [place] [phone]  Please call patient if any further info is needed. [phone]

## 2022-09-19 NOTE — Telephone Encounter (Signed)
Contacted Dr Lucious Groves office requesting more information. I believe a clearance is being requested. I was transferred to the surgical scheduler. I left her a detailed message stating if their office is requesting cardiac clearance then they an send a request to our office and if they have any additional questions then they can contact our office and ask to speak with someone in the pre-op pool.

## 2022-09-20 ENCOUNTER — Ambulatory Visit: Payer: Self-pay | Admitting: Student

## 2022-09-20 NOTE — Telephone Encounter (Signed)
Echocardiogram has already been scheduled by another provider. Carotid ultrasound has been ordered.

## 2022-09-20 NOTE — Addendum Note (Signed)
Addended by: Frutoso Schatz on: 09/20/2022 01:38 PM   Modules accepted: Orders

## 2022-09-20 NOTE — Telephone Encounter (Signed)
Cardiac testing has been ordered. Once ordering provider clears the pt they will let the requesting office know the has been cleared.

## 2022-09-20 NOTE — Telephone Encounter (Signed)
Summer from [facility] stated Dr. Dione Booze wanted [place] to be aware they found plaque in the right eye of the patient. Summer stated Dr. Dione Booze was recommending for the patient to have an Echo and cardiac test. Please advise.

## 2022-09-24 ENCOUNTER — Ambulatory Visit (INDEPENDENT_AMBULATORY_CARE_PROVIDER_SITE_OTHER): Payer: 59 | Admitting: Podiatry

## 2022-09-24 DIAGNOSIS — M79675 Pain in left toe(s): Secondary | ICD-10-CM | POA: Diagnosis not present

## 2022-09-24 DIAGNOSIS — B351 Tinea unguium: Secondary | ICD-10-CM

## 2022-09-24 DIAGNOSIS — M79674 Pain in right toe(s): Secondary | ICD-10-CM

## 2022-09-24 NOTE — Progress Notes (Signed)
     No chief complaint on file.   SUBJECTIVE Patient presents to office today complaining of elongated, thickened nails that cause pain while ambulating in shoes.  Patient is unable to trim their own nails. Patient is here for further evaluation and treatment.  Past Medical History:  Diagnosis Date   Acute nonintractable headache 05/31/2021   Alcohol abuse    Anxiety    Arthritis    hands, back, knees   Asthma    Atypical angina (HCC) (Class III) 12/19/2011   Baker's cyst of knee, right 06/21/2016   Chest pain at rest 10/04/2016   Depression    Epigastric pain 05/20/2020   Fall 10/23/2021   GERD (gastroesophageal reflux disease)    Glaucoma    Heart murmur asymptomatic   Hx of radioisotope therapy 07/26/2011   Radiosactive Prostate Seed Implant/ I-125 Seeds   Hypertension    Neck pain 10/22/2019   Prostate cancer (HCC) dx'd 2016 or 2017   seed implants   Rheumatoid arthritis (HCC) 10/10/2012    Allergies  Allergen Reactions   Tramadol Nausea Only   Pollen Extract Other (See Comments)    Sneezing     OBJECTIVE General Patient is awake, alert, and oriented x 3 and in no acute distress. Derm Skin is dry and supple bilateral. Negative open lesions or macerations. Remaining integument unremarkable. Nails are tender, long, thickened and dystrophic with subungual debris, consistent with onychomycosis, 1-5 bilateral. No signs of infection noted. Vasc  DP and PT pedal pulses palpable bilaterally. Temperature gradient within normal limits.  Neuro Epicritic and protective threshold sensation grossly intact bilaterally.  Musculoskeletal Exam No symptomatic pedal deformities noted bilateral. Muscular strength within normal limits.  ASSESSMENT 1.  Pain due to onychomycosis of toenails both  PLAN OF CARE 1. Patient evaluated today.  2. Instructed to maintain good pedal hygiene and foot care.  3. Mechanical debridement of nails 1-5 bilaterally performed using a nail nipper.  Filed with dremel without incident.  4. Return to clinic in 4 months   Felecia Shelling, DPM [facility]  Dr. Felecia Shelling, DPM    [address] [place], [place] [address]                Office [phone]  Fax [phone]

## 2022-09-26 ENCOUNTER — Other Ambulatory Visit (HOSPITAL_COMMUNITY): Payer: 59

## 2022-09-28 ENCOUNTER — Ambulatory Visit (HOSPITAL_COMMUNITY): Payer: 59 | Attending: Family Medicine

## 2022-09-28 DIAGNOSIS — I959 Hypotension, unspecified: Secondary | ICD-10-CM | POA: Diagnosis not present

## 2022-09-28 LAB — ECHOCARDIOGRAM COMPLETE
AV Vena cont: 0.2 cm
Area-P 1/2: 3.27 cm2
Calc EF: 55.1 %
Est EF: 55
P 1/2 time: 456 msec
S' Lateral: 2.7 cm
Single Plane A2C EF: 52.9 %
Single Plane A4C EF: 57.6 %

## 2022-10-03 ENCOUNTER — Other Ambulatory Visit: Payer: Self-pay | Admitting: Student

## 2022-10-04 ENCOUNTER — Ambulatory Visit (INDEPENDENT_AMBULATORY_CARE_PROVIDER_SITE_OTHER): Payer: 59 | Admitting: Student

## 2022-10-04 VITALS — BP 149/92 | HR 70 | Ht 70.0 in | Wt 171.6 lb

## 2022-10-04 DIAGNOSIS — I10 Essential (primary) hypertension: Secondary | ICD-10-CM | POA: Diagnosis not present

## 2022-10-04 NOTE — Assessment & Plan Note (Signed)
Patient's blood pressure is not controlled today. BP: (!) 149/92. Goal of 140/90. Patient's medication regimen includes none due to fall hx and hypotension. -Changes to current regimen include none -Referral to pharmacy clinic due to Discrepancy between home and clinic BP and history of hospitalization due to fall and hypotension.  Will send to clinic for ambulatory blood pressure monitor.

## 2022-10-04 NOTE — Patient Instructions (Signed)
It was great to see you! Thank you for allowing me to participate in your care!   Our plans for today:  - We will have you see Dr. Raymondo Band in our clinic for an ambulatory blood pressure monitor to see what your blood pressure trends are doing  Take care and seek immediate care sooner if you develop any concerns.  Levin Erp, MD

## 2022-10-04 NOTE — Assessment & Plan Note (Signed)
Chronic pain syndrome of the knees and shoulders.  Discussed with him that the other possibility of fall could have been related to his opioids.  He is very amenable to decreasing this if needed.  He is currently taking the Percocet 10-3 25 twice daily currently.  He is also due the buprenorphine patch weekly.  He does get these filled at the pain clinic.  We discussed if he starts having lightheadedness dizziness or additional falls we may need to discuss further about decreasing his medications.  He was amenable to this. -Continue to monitor

## 2022-10-04 NOTE — Progress Notes (Signed)
    SUBJECTIVE:   CHIEF COMPLAINT / HPI:   Hypotension  Fall Recent hospitalization last month with dizziness and low blood pressures at home.  At that time antihypertensives were stopped in the hospital. Denies any dizziness or lightheadedness Checking it at home has been low per patient around 109/98 Not feeling dizzy any more No additional falls Echocardiogram obtained showed EF 55%.  Basal inferolateral hypokinesis.  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 He denies any vision changes.   He is scheduled to get a vascular ultrasound of his carotids on 8/29  Chronic Pain Sees pain specialist for refills Takes percocet 10-325 (ordered as q4h PRN)-- actually taking it twice a day and buprenorphine patch 7.5 mcg weekly for knee and shoulder pain. Places patch on knees.   PERTINENT  PMH / PSH: COPD, RA  OBJECTIVE:   BP (!) 149/92   Pulse 70   Ht 5\' 10"  (1.778 m)   Wt 171 lb 9.6 oz (77.8 kg)   SpO2 100%   BMI 24.62 kg/m   General: Well appearing, NAD, awake, alert, responsive to questions Head: Normocephalic atraumatic CV: Regular rate and rhythm Respiratory: Clear to ausculation bilaterally, no wheezes rales or crackles, chest rises symmetrically,  no increased work of breathing Extremities: Moves upper and lower extremities freely, no edema in LE  ASSESSMENT/PLAN:   HTN (hypertension) Patient's blood pressure is not controlled today. BP: (!) 149/92. Goal of 140/90. Patient's medication regimen includes none due to fall hx and hypotension. -Changes to current regimen include none -Referral to pharmacy clinic due to Discrepancy between home and clinic BP and history of hospitalization due to fall and hypotension.  Will send to clinic for ambulatory blood pressure monitor.   Opioid dependence (HCC) Chronic pain syndrome of the knees and shoulders.  Discussed with him that the other possibility of fall could have been related to his opioids.  He is  very amenable to decreasing this if needed.  He is currently taking the Percocet 10-3 25 twice daily currently.  He is also due the buprenorphine patch weekly.  He does get these filled at the pain clinic.  We discussed if he starts having lightheadedness dizziness or additional falls we may need to discuss further about decreasing his medications.  He was amenable to this. -Continue to monitor   Levin Erp, MD [facility]

## 2022-10-10 ENCOUNTER — Encounter: Payer: Self-pay | Admitting: Pharmacist

## 2022-10-10 ENCOUNTER — Ambulatory Visit (INDEPENDENT_AMBULATORY_CARE_PROVIDER_SITE_OTHER): Payer: 59 | Admitting: Pharmacist

## 2022-10-10 VITALS — BP 144/82 | Ht 69.0 in | Wt 170.2 lb

## 2022-10-10 DIAGNOSIS — I10 Essential (primary) hypertension: Secondary | ICD-10-CM

## 2022-10-10 NOTE — Assessment & Plan Note (Signed)
History of hypertension longstanding currently taking no BP medications with recent episodes of dizziness and falls. Now Off all blood pressure lowering agents. Initial systolic goal pressure of <140 in office and < 130 if tolerated.   -Placed blood pressure cuff, provided education, patient instructed to wear cuff for 24 hours and return tomorrow to review results.

## 2022-10-10 NOTE — Progress Notes (Signed)
Reviewed and agree with Dr Koval's plan.   

## 2022-10-10 NOTE — Progress Notes (Signed)
   S:     Chief Complaint  Patient presents with   Medication Management    Amb BP Day 1   82 y.o. male who presents for hypertension evaluation, education, and management.  PMH is significant for COPD, RA.  He arrives without complaint and ambulating with a cane.  Patient was referred and last seen by Primary Care Provider, Dr. Laroy Apple, on 10/04/22.  At last visit with Dr. Laroy Apple, no changes were made to patient's medication regimen.  Diagnosed with Hypertension longstanding.  Medication compliance is reported to be good.  Discussed procedure for wearing the monitor and gave patient written instructions. Monitor was placed on non-dominant (left) arm with instructions to return in the morning.   Current BP Medications include:  none  Antihypertensives tried in the past include: amlodipine 5 mg, lisinopril 20 mg, metoprolol succinate ER 50 mg, metoprolol tartrate 50 mg  O:  Review of Systems  All other systems reviewed and are negative.   Physical Exam Constitutional:      Appearance: Normal appearance.  Neurological:     Mental Status: He is alert.  Psychiatric:        Mood and Affect: Mood normal.        Behavior: Behavior normal.        Thought Content: Thought content normal.        Judgment: Judgment normal.     Last 3 Office BP readings: BP Readings from Last 3 Encounters:  10/04/22 (!) 149/92  09/04/22 126/61  08/29/22 131/85    Clinical Atherosclerotic Cardiovascular Disease (ASCVD): Yes   Basic Metabolic Panel    Component Value Date/Time   NA 138 08/29/2022 0029   NA 138 04/16/2022 1150   K 3.6 08/29/2022 0029   CL 109 08/29/2022 0029   CO2 23 08/29/2022 0029   GLUCOSE 100 (H) 08/29/2022 0029   BUN 9 08/29/2022 0029   BUN 13 04/16/2022 1150   CREATININE 0.85 08/29/2022 0029   CREATININE 0.85 07/03/2013 0952   CALCIUM 8.5 (L) 08/29/2022 0029   GFRNONAA >60 08/29/2022 0029   GFRAA 92 03/09/2019 1627     ABPM Study Data: Arm Placement  left arm   For Office Goal BP of <140 mmHg Systolic - lower if tolerated:  ABPM thresholds: Overall Systolic BP <130 mmHg, daytime BP <135 mmHg, sleeptime BP <120 mmHg    A/P: History of hypertension longstanding currently taking no BP medications with recent episodes of dizziness and falls. Now Off all blood pressure lowering agents. Initial systolic goal pressure of <140 in office and < 130 if tolerated.   -Placed blood pressure cuff, provided education, patient instructed to wear cuff for 24 hours and return tomorrow to review results.   Written patient instructions provided including activity/symptom/event log. Patient verbalized understanding of plan. Total time in face to face counseling 19 minutes.    Follow-up: Tomorrow AM - early morning appointment 8:30 AM  Patient seen with Andee Poles, PharmD Candidate.

## 2022-10-10 NOTE — Patient Instructions (Signed)
Blood Pressure Activity Diary Time Lying down/ Sleeping Walking/ Exercise Stressed/ Angry Headache/ Pain Dizzy  9 AM       10 AM       11 AM       12 PM       1 PM       2 PM       Time Lying down/ Sleeping Walking/ Exercise Stressed/ Angry Headache/ Pain Dizzy  3 PM       4 PM        5 PM       6 PM       7 PM       8 PM       Time Lying down/ Sleeping Walking/ Exercise Stressed/ Angry Headache/ Pain Dizzy  9 PM       10 PM       11 PM       12 AM       1 AM       2 AM       3 AM       Time Lying down/ Sleeping Walking/ Exercise Stressed/ Angry Headache/ Pain Dizzy  4 AM       5 AM       6 AM       7 AM       8 AM       9 AM       10 AM        Time you woke up: _________                  Time you went to sleep:__________  Come back tomorrow at 8:30 a.m. to have the monitor removed  Call the [facility] if you have any questions before then [phone])  Wearing the Blood Pressure Monitor The cuff will inflate every 20 minutes during the day and every 30 minutes while you sleep. Fill out the blood pressure-activity diary during the day, especially during activities that may affect your reading -- such as exercise, stress, walking, taking your blood pressure medications  Important things to know: Avoid taking the monitor off for the next 24 hours, unless it causes you discomfort or pain. Do NOT get the monitor wet and do NOT try to clean the monitor with any cleaning products. Do NOT put the monitor on anyone else's arm. When the cuff inflates, avoid excess movement. Let the cuffed arm hang loosely, slightly away from the body. Avoid flexing the muscles or moving the hand/fingers. Remember to fill out the blood pressure activity diary. If you experience severe pain or unusual pain (not associated with getting your blood pressure checked), remove the monitor.  Troubleshooting:  Code  Troubleshooting   1  Check cuff position, tighten cuff   2, 3  Remain  still during reading   4, 87  Check air hose connections and make sure cuff is tight   85, 89  Check hose connections and make tubing is not crimped   86  Push START/STOP to restart reading   88, 91  Retry by pushing START/STOP   90  Replace batteries. If problem persists, remove monitor and bring back to   clinic at follow up   97, 98, 99  Service required - Remove monitor and bring back to clinic at follow up

## 2022-10-11 ENCOUNTER — Encounter: Payer: Self-pay | Admitting: Pharmacist

## 2022-10-11 ENCOUNTER — Ambulatory Visit (HOSPITAL_COMMUNITY)
Admission: RE | Admit: 2022-10-11 | Discharge: 2022-10-11 | Disposition: A | Discharge status: Home / Self Care | Payer: 59 | Source: Ambulatory Visit | Attending: Cardiology | Admitting: Cardiology

## 2022-10-11 ENCOUNTER — Ambulatory Visit (INDEPENDENT_AMBULATORY_CARE_PROVIDER_SITE_OTHER): Payer: 59 | Admitting: Pharmacist

## 2022-10-11 VITALS — BP 119/70 | Wt 171.4 lb

## 2022-10-11 DIAGNOSIS — I10 Essential (primary) hypertension: Secondary | ICD-10-CM

## 2022-10-11 DIAGNOSIS — H34211 Partial retinal artery occlusion, right eye: Secondary | ICD-10-CM | POA: Insufficient documentation

## 2022-10-11 NOTE — Assessment & Plan Note (Signed)
History of hypertension longstanding currently taking no BP medications with recent episodes of dizziness and falls. Now off all blood pressure lowering agents. Found to have normal blood pressure with 24-hour ambulatory blood pressure evaluation which demonstrates an average AWAKE blood pressure of 115/69 mmHg. Suspect that patient's decrease in weight along with chronic use of narcotics contributes to BP lowering. Nocturnal dipping pattern is abnormal and averages above patient's awake BP. Initial systolic goal pressure of < 140 mmHg in office and < 130 mmHg if tolerated.  Overall pressure of 118/70 mmHg is reassuring.    - No changes to medications.

## 2022-10-11 NOTE — Patient Instructions (Signed)
It was nice to see you today!  Thank you for completing the blood pressure monitoring evaluation.  Your goal blood pressure is < 130 mmHg   Medication Changes:  Continue all other medication the same.   Monitor blood pressure at home and keep a log (on a piece of paper) to bring with you to your next visit.

## 2022-10-11 NOTE — Progress Notes (Signed)
   S:     Chief Complaint  Patient presents with   Medication Management    Ambulatory BP Monitoring Day 2    82 y.o. male who presents for hypertension evaluation, education, and management.  Patient arrives in good spirits and presents with the assistance of a cane.  PMH is significant for chronic pain and HTN. Patient returns to clinic with 24 hour blood pressure monitor and reports being stressed around 4 pm the day before.  Patient reports they were able to wear the Ambulatory Blood Pressure Cuff for the entire 24 evaluation period.   O:  Review of Systems  All other systems reviewed and are negative.   Physical Exam Constitutional:      Appearance: Normal appearance.  Neurological:     Mental Status: He is alert.  Psychiatric:        Mood and Affect: Mood normal.        Behavior: Behavior normal.        Thought Content: Thought content normal.        Judgment: Judgment normal.     Last 3 Office BP readings: BP Readings from Last 3 Encounters:  10/11/22 119/70  10/10/22 (!) 144/82  10/04/22 (!) 149/92    Clinical Atherosclerotic Cardiovascular Disease (ASCVD): Yes  Known atherosclerosis of aorta  Basic Metabolic Panel    Component Value Date/Time   NA 138 08/29/2022 0029   NA 138 04/16/2022 1150   K 3.6 08/29/2022 0029   CL 109 08/29/2022 0029   CO2 23 08/29/2022 0029   GLUCOSE 100 (H) 08/29/2022 0029   BUN 9 08/29/2022 0029   BUN 13 04/16/2022 1150   CREATININE 0.85 08/29/2022 0029   CREATININE 0.85 07/03/2013 0952   CALCIUM 8.5 (L) 08/29/2022 0029   GFRNONAA >60 08/29/2022 0029   GFRAA 92 03/09/2019 1627    Renal function: CrCl cannot be calculated (Patient's most recent lab result is older than the maximum 21 days allowed.).   ABPM Study Data: Arm Placement left arm  Overall Mean 24hr BP:   118/70 mmHg  HR: 79  Daytime Mean BP:  115/69 mmHg  HR: 85  Nighttime Mean BP:  126/72 mmHg  HR: 64  Dipping Pattern: Yes.    Sys:   -9.7%   Dia:  -4.3%   [normal dipping ~10-20%]   For Office Goal BP of <140 mmHg Systolic - lower if tolerated ABPM thresholds: Overall Systolic BP <130 mmHg, daytime BP <135 mmHg, sleeptime BP <120 mmHg    A/P: History of hypertension longstanding currently taking no BP medications with recent episodes of dizziness and falls. Now off all blood pressure lowering agents. Found to have normal blood pressure with 24-hour ambulatory blood pressure evaluation which demonstrates an average AWAKE blood pressure of 115/69 mmHg. Suspect that patient's decrease in weight along with chronic use of narcotics contributes to BP lowering. Nocturnal dipping pattern is abnormal and averages above patient's awake BP. Initial systolic goal pressure of < 140 mmHg in office and < 130 mmHg if tolerated.  Overall pressure of 118/70 mmHg is reassuring.    - No changes to medications.   Results reviewed and written information provided.    Written patient instructions provided. Patient verbalized understanding of treatment plan.  Total time in face to face counseling 37 minutes.    Follow-up:  Pharmacist PRN PCP clinic visit in 11/12/22 with Dr. Laroy Apple  Patient seen with Rickey Primus, PharmD Candidate and Andee Poles, PharmD Candidate.

## 2022-10-12 DIAGNOSIS — Z9181 History of falling: Secondary | ICD-10-CM | POA: Diagnosis not present

## 2022-10-12 DIAGNOSIS — M25562 Pain in left knee: Secondary | ICD-10-CM | POA: Diagnosis not present

## 2022-10-12 DIAGNOSIS — M129 Arthropathy, unspecified: Secondary | ICD-10-CM | POA: Diagnosis not present

## 2022-10-12 DIAGNOSIS — E559 Vitamin D deficiency, unspecified: Secondary | ICD-10-CM | POA: Diagnosis not present

## 2022-10-12 DIAGNOSIS — Z6824 Body mass index (BMI) 24.0-24.9, adult: Secondary | ICD-10-CM | POA: Diagnosis not present

## 2022-10-12 DIAGNOSIS — M25561 Pain in right knee: Secondary | ICD-10-CM | POA: Diagnosis not present

## 2022-10-12 DIAGNOSIS — G8929 Other chronic pain: Secondary | ICD-10-CM | POA: Diagnosis not present

## 2022-10-12 DIAGNOSIS — G894 Chronic pain syndrome: Secondary | ICD-10-CM | POA: Diagnosis not present

## 2022-10-12 DIAGNOSIS — Z79899 Other long term (current) drug therapy: Secondary | ICD-10-CM | POA: Diagnosis not present

## 2022-10-12 DIAGNOSIS — E785 Hyperlipidemia, unspecified: Secondary | ICD-10-CM | POA: Diagnosis not present

## 2022-10-12 DIAGNOSIS — M545 Low back pain, unspecified: Secondary | ICD-10-CM | POA: Diagnosis not present

## 2022-10-12 DIAGNOSIS — R7303 Prediabetes: Secondary | ICD-10-CM | POA: Diagnosis not present

## 2022-10-12 NOTE — Progress Notes (Signed)
Reviewed and agree with Dr Koval's plan.   

## 2022-10-16 ENCOUNTER — Emergency Department (HOSPITAL_COMMUNITY)
Admission: EM | Admit: 2022-10-16 | Discharge: 2022-10-16 | Disposition: A | Discharge status: Home / Self Care | Payer: 59 | Attending: Emergency Medicine | Admitting: Emergency Medicine

## 2022-10-16 ENCOUNTER — Other Ambulatory Visit: Payer: Self-pay

## 2022-10-16 ENCOUNTER — Emergency Department (HOSPITAL_COMMUNITY): Payer: 59

## 2022-10-16 DIAGNOSIS — D649 Anemia, unspecified: Secondary | ICD-10-CM | POA: Diagnosis not present

## 2022-10-16 DIAGNOSIS — I499 Cardiac arrhythmia, unspecified: Secondary | ICD-10-CM | POA: Diagnosis not present

## 2022-10-16 DIAGNOSIS — R6889 Other general symptoms and signs: Secondary | ICD-10-CM | POA: Diagnosis not present

## 2022-10-16 DIAGNOSIS — I2510 Atherosclerotic heart disease of native coronary artery without angina pectoris: Secondary | ICD-10-CM | POA: Diagnosis not present

## 2022-10-16 DIAGNOSIS — J449 Chronic obstructive pulmonary disease, unspecified: Secondary | ICD-10-CM | POA: Diagnosis not present

## 2022-10-16 DIAGNOSIS — I10 Essential (primary) hypertension: Secondary | ICD-10-CM | POA: Diagnosis not present

## 2022-10-16 DIAGNOSIS — R0602 Shortness of breath: Secondary | ICD-10-CM | POA: Diagnosis not present

## 2022-10-16 DIAGNOSIS — Z7982 Long term (current) use of aspirin: Secondary | ICD-10-CM | POA: Diagnosis not present

## 2022-10-16 DIAGNOSIS — R519 Headache, unspecified: Secondary | ICD-10-CM | POA: Diagnosis not present

## 2022-10-16 DIAGNOSIS — Z79899 Other long term (current) drug therapy: Secondary | ICD-10-CM | POA: Insufficient documentation

## 2022-10-16 DIAGNOSIS — Z743 Need for continuous supervision: Secondary | ICD-10-CM | POA: Diagnosis not present

## 2022-10-16 DIAGNOSIS — J439 Emphysema, unspecified: Secondary | ICD-10-CM | POA: Diagnosis not present

## 2022-10-16 LAB — BASIC METABOLIC PANEL
Anion gap: 8 (ref 5–15)
BUN: 11 mg/dL (ref 8–23)
CO2: 24 mmol/L (ref 22–32)
Calcium: 8.9 mg/dL (ref 8.9–10.3)
Chloride: 108 mmol/L (ref 98–111)
Creatinine, Ser: 0.82 mg/dL (ref 0.61–1.24)
GFR, Estimated: >60 mL/min (ref >60)
Glucose, Bld: 108 mg/dL — ABNORMAL HIGH (ref 70–99)
Potassium: 3.8 mmol/L (ref 3.5–5.1)
Sodium: 140 mmol/L (ref 135–145)

## 2022-10-16 LAB — D-DIMER, QUANTITATIVE: D-Dimer, Quant: 0.47 ug{FEU}/mL (ref 0.00–0.50)

## 2022-10-16 LAB — CBC
HCT: 38.6 % — ABNORMAL LOW (ref 39.0–52.0)
Hemoglobin: 12.2 g/dL — ABNORMAL LOW (ref 13.0–17.0)
MCH: 27.9 pg (ref 26.0–34.0)
MCHC: 31.6 g/dL (ref 30.0–36.0)
MCV: 88.3 fL (ref 80.0–100.0)
Platelets: 188 10*3/uL (ref 150–400)
RBC: 4.37 MIL/uL (ref 4.22–5.81)
RDW: 14 % (ref 11.5–15.5)
WBC: 5.4 10*3/uL (ref 4.0–10.5)
nRBC: 0 % (ref 0.0–0.2)

## 2022-10-16 LAB — BRAIN NATRIURETIC PEPTIDE: B Natriuretic Peptide: 44.2 pg/mL (ref 0.0–100.0)

## 2022-10-16 LAB — TROPONIN I (HIGH SENSITIVITY)
Troponin I (High Sensitivity): 6 ng/L (ref <18)
Troponin I (High Sensitivity): 6 ng/L (ref <18)

## 2022-10-16 MED ORDER — FUROSEMIDE 10 MG/ML IJ SOLN
40.0000 mg | Freq: Once | INTRAMUSCULAR | Status: AC
  Administered 2022-10-16: 40 mg via INTRAVENOUS
  Filled 2022-10-16: qty 4

## 2022-10-16 MED ORDER — FUROSEMIDE 40 MG PO TABS: 40.0000 mg | ORAL_TABLET | Freq: Every day | ORAL | 0 refills | Status: DC

## 2022-10-16 MED ORDER — ALBUTEROL SULFATE HFA 108 (90 BASE) MCG/ACT IN AERS
2.0000 | INHALATION_SPRAY | RESPIRATORY_TRACT | Status: DC | PRN
  Administered 2022-10-16: 2 via RESPIRATORY_TRACT
  Filled 2022-10-16: qty 6.7

## 2022-10-16 NOTE — ED Provider Notes (Signed)
[place] EMERGENCY DEPARTMENT AT [facility] Provider Note   CSN: [id number] Arrival date & time: 10/16/22  0141     History  Chief Complaint  Patient presents with   Shortness of Breath    Joel Donaldson is a 82 y.o. male.  The history is provided by the patient.  Shortness of Breath He has history of hypertension, COPD, asthma/COPD, rheumatoid arthritis, coronary artery disease and comes in because of shortness of breath which started about 4 PM.  It came on gradually.  He denies cough and denies chest pain, heaviness, tightness, pressure.  He states that this is different from when his COPD flares up.  He denies fever, chills, sweats.  There has been no treatment at home.   Home Medications Prior to Admission medications   Medication Sig Start Date End Date Taking? Authorizing Provider  acetaminophen (TYLENOL) 500 MG tablet Take 2 tablets (1,000 mg total) by mouth every 6 (six) hours as needed. Patient taking differently: Take 1,000 mg by mouth every 6 (six) hours as needed for moderate pain. 10/14/21   Arby Barrette, MD  albuterol (VENTOLIN HFA) 108 (90 Base) MCG/ACT inhaler INHALE 1 TO 2 PUFFS INTO THE LUNGS EVERY 4 HOURS AS NEEDED FOR WHEEZING OR SHORTNESS OF BREATH Patient taking differently: Inhale 2 puffs into the lungs every 4 (four) hours as needed for wheezing or shortness of breath. 05/28/22   Levin Erp, MD  aspirin EC 81 MG tablet Take 81 mg by mouth every morning.     [provider]  atorvastatin (LIPITOR) 20 MG tablet TAKE 1 TABLET(20 MG) BY MOUTH DAILY Patient taking differently: Take 20 mg by mouth daily. 07/16/22   Croitoru, Mihai, MD  bacitracin ointment Apply 1 Application topically 2 (two) times daily. 10/14/21   Arby Barrette, MD  buprenorphine Lavera Guise) 7.5 MCG/HR Place 1 patch onto the skin every Thursday. 05/17/20   [provider]  diclofenac Sodium (VOLTAREN) 1 % GEL Apply 4 g topically 4 (four) times daily. 01/10/21    Brimage, Seward Meth, DO  INCRUSE ELLIPTA 62.5 MCG/ACT AEPB INHALE 1 PUFF INTO THE LUNGS DAILY 09/20/22   Levin Erp, MD  lidocaine (XYLOCAINE) 5 % ointment Apply 1 application topically daily as needed for mild pain or moderate pain. 01/10/21   Katha Cabal, DO  Multiple Vitamins-Minerals (ONE-A-DAY PROACTIVE 65+) TABS Take 1 tablet by mouth daily with breakfast.    [provider]  nitroGLYCERIN (NITROSTAT) 0.4 MG SL tablet Place 1 tablet (0.4 mg total) under the tongue every 5 (five) minutes as needed for chest pain (chest pain). 05/25/21   Brimage, Seward Meth, DO  omeprazole (PRILOSEC) 40 MG capsule TAKE 1 CAPSULE(40 MG) BY MOUTH DAILY BEFORE BREAKFAST 10/03/22   Levin Erp, MD  oxyCODONE-acetaminophen (PERCOCET) 10-325 MG tablet Take 1 tablet by mouth every 4 (four) hours as needed for pain. 02/26/22   [provider]  polyethylene glycol (MIRALAX / GLYCOLAX) 17 g packet Take 17 g by mouth daily as needed for mild constipation. 01/10/21   Katha Cabal, DO      Allergies    Tramadol and Pollen extract    Review of Systems   Review of Systems  Respiratory:  Positive for shortness of breath.   All other systems reviewed and are negative.   Physical Exam Updated Vital Signs BP (!) 141/95   Pulse 94   Temp 98.2 F (36.8 C) (Oral)   Resp 20   Ht 5\' 9"  (1.753 m)   Wt 80.7  kg   SpO2 100%   BMI 26.29 kg/m  Physical Exam Vitals and nursing note reviewed.   82 year old male, resting comfortably and in no acute distress. Vital signs are significant for mildly elevated blood pressure. Oxygen saturation is 100%, which is normal. Head is normocephalic and atraumatic. PERRLA, EOMI. Oropharynx is clear. Neck is nontender and supple without adenopathy or JVD. Back is nontender and there is no CVA tenderness. Lungs have few left basilar rales.  There are no wheezes or rhonchi. Chest is nontender. Heart has regular rate and rhythm without murmur. Abdomen is soft, flat,  nontender. Extremities have trace edema, full range of motion is present. Skin is warm and dry without rash. Neurologic: Mental status is normal, cranial nerves are intact, moves all extremities equally.  ED Results / Procedures / Treatments   Labs (all labs ordered are listed, but only abnormal results are displayed) Labs Reviewed  BASIC METABOLIC PANEL - Abnormal; Notable for the following components:      Result Value   Glucose, Bld 108 (*)    All other components within normal limits  CBC - Abnormal; Notable for the following components:   Hemoglobin 12.2 (*)    HCT 38.6 (*)    All other components within normal limits  BRAIN NATRIURETIC PEPTIDE  D-DIMER, QUANTITATIVE  TROPONIN I (HIGH SENSITIVITY)  TROPONIN I (HIGH SENSITIVITY)    EKG EKG Interpretation Date/Time:  Tuesday October 16 2022 01:50:29 EDT Ventricular Rate:  93 PR Interval:  186 QRS Duration:  82 QT Interval:  378 QTC Calculation: 469 R Axis:   -6  Text Interpretation: Normal sinus rhythm Normal ECG When compared with ECG of 29-Aug-2022 06:06, HEART RATE has increased Confirmed by Dione Booze ([id number]) on 10/16/2022 2:51:12 AM  Radiology DG Chest 2 View  Result Date: 10/16/2022 CLINICAL DATA:  COPD EXAM: CHEST - 2 VIEW COMPARISON:  None Available. FINDINGS: The heart and mediastinal contours are within normal limits. No focal consolidation. Chronic coarsened interstitial markings with no overt pulmonary edema. No pleural effusion. No pneumothorax. No acute osseous abnormality.  Old healed rib fractures. IMPRESSION: 1. No active cardiopulmonary disease. 2.  Emphysema (ICD10-J43.9). Electronically Signed   By: Tish Frederickson M.D.   On: 10/16/2022 02:41    Procedures Procedures  Cardiac monitor shows normal sinus rhythm, per my interpretation.  Medications Ordered in ED Medications  albuterol (VENTOLIN HFA) 108 (90 Base) MCG/ACT inhaler 2 puff (2 puffs Inhalation Given 10/16/22 0157)  furosemide (LASIX)  injection 40 mg (40 mg Intravenous Given 10/16/22 0454)    ED Course/ Medical Decision Making/ A&P                                 Medical Decision Making Amount and/or Complexity of Data Reviewed Labs: ordered. Radiology: ordered.  Risk Prescription drug management.   Shortness of breath with normal oxygen saturation and normal respiratory effort.  Rales and peripheral edema are suggestive of heart failure.  Lack of wheezing suggests this is not a COPD or asthma.  Although no risk factors, consider pulmonary embolism.  I have reviewed his past records, and on 09/16/2020 he had cardiac catheterization which showed nonobstructive coronary artery disease with 30% lesion of distal left main, mild diffuse disease in the left circumflex, 60% lesion of left posterior AV artery, severe diffuse disease throughout the right coronary artery.  She had an echo cardiogram on 09/28/2022 showing ejection fraction of  55%, grade 1 diastolic dysfunction.  I have ordered workup of chest x-ray, CBC, basic metabolic panel, troponin, BNP.  Because of symptoms which are not typical of his COPD, I have also ordered a D-dimer to rule out pulmonary embolism.  I have reviewed his laboratory tests, and my interpretation is mild elevation of random glucose which will need to be followed as an outpatient, normal troponin, stable anemia with BNP and D-dimer pending.  Chest x-ray is read by radiologist as showing no active cardiopulmonary disease, but there is mention of chronic coarsened interstitial markings.  I have independently viewed the images, and I am concerned that the coarsened interstitial markings actually represent mild heart failure.  I have reviewed and interpreted his electrocardiogram, and my interpretation is borderline left axis deviation and otherwise normal ECG.  On her past records, she had minimally elevated BNP on 12/14/2019, normal BNP on 08/28/2022.  D-dimer is normal, BNP is normal.  In spite of normal BNP,  I suspect is shortness of breath is from heart failure-especially with recent echocardiogram showing diastolic dysfunction.  I have ordered a dose of furosemide following which she had good diuresis.  Patient states that his shortness of breath had improved slightly.  I had him ambulate in the emergency department without any oxygen desaturation.  I feel he is safe for discharge.  I am discharging him with a prescription for 5-day course of furosemide and I am referring him back to cardiology for further evaluation.  Return precautions are discussed.        Final Clinical Impression(s) / ED Diagnoses Final diagnoses:  Shortness of breath  Normochromic normocytic anemia    Rx / DC Orders ED Discharge Orders          Ordered    furosemide (LASIX) 40 MG tablet  Daily        10/16/22 0638    Ambulatory referral to Cardiology       Comments: If you have not heard from the Cardiology office within the next 72 hours please call [phone].   10/16/22 8295              Dione Booze, MD 10/16/22 [phone]

## 2022-10-16 NOTE — ED Triage Notes (Signed)
Pt reports SHOB that began at 4pm yesterday and reports using albuterol and it ran out. Pt has hx of asthma and COPD. Pt able to talk in complete sentences without issue and denies pain in triage.

## 2022-10-16 NOTE — Discharge Instructions (Signed)
Return if your symptoms are getting worse. 

## 2022-10-16 NOTE — ED Provider Triage Note (Signed)
Emergency Medicine Provider Triage Evaluation Note  Joel Donaldson , a 82 y.o. male  was evaluated in triage.  Pt complains of shortness of breath started earlier today around 4 PM, came on gradually, states has improved but still feels shortness of breath, does have some slight chest pain but this is since resolved no fevers cough congestion, states that this does not feel like his normal COPD or asthma, no history PEs or DVTs no recent surgeries no long immobilizations..  Review of Systems  Positive: SOB shortness of breath Negative: Nausea vomiting  Physical Exam  BP (!) 141/95   Pulse 94   Temp 98.2 F (36.8 C) (Oral)   Resp 20   Ht 5\' 9"  (1.753 m)   Wt 80.7 kg   SpO2 100%   BMI 26.29 kg/m  Gen:   Awake, no distress   Resp:  Normal effort  MSK:   Moves extremities without difficulty  Other:    Medical Decision Making  Medically screening exam initiated at 2:01 AM.  Appropriate orders placed.  Joel Donaldson was informed that the remainder of the evaluation will be completed by another provider, this initial triage assessment does not replace that evaluation, and the importance of remaining in the ED until their evaluation is complete.  Lab workup imaging have been ordered will need further workup.   Carroll Sage, PA-C 10/16/22 0202

## 2022-10-16 NOTE — ED Notes (Signed)
NT ambulated pt and his o2 was between 98-99. He walked very good with NT.

## 2022-10-17 ENCOUNTER — Other Ambulatory Visit: Payer: Self-pay | Admitting: Student

## 2022-10-17 DIAGNOSIS — Z79899 Other long term (current) drug therapy: Secondary | ICD-10-CM | POA: Diagnosis not present

## 2022-10-17 DIAGNOSIS — J449 Chronic obstructive pulmonary disease, unspecified: Secondary | ICD-10-CM

## 2022-11-06 ENCOUNTER — Telehealth: Payer: Self-pay

## 2022-11-06 NOTE — Telephone Encounter (Signed)
Transition Care Management Unsuccessful Follow-up Telephone Call  Date of discharge and from where:  [facility] 9/3  Attempts:  1st Attempt  Reason for unsuccessful TCM follow-up call:  No answer/busy   Lenard Forth Turon  [facility], [facility] Guide, Phone: [phone] Website: [email or web address]

## 2022-11-08 ENCOUNTER — Telehealth: Payer: Self-pay

## 2022-11-08 NOTE — Telephone Encounter (Signed)
Transition Care Management Unsuccessful Follow-up Telephone Call  Date of discharge and from where:  [facility]  Attempts:  2nd Attempt  Reason for unsuccessful TCM follow-up call:  No answer/busy   Lenard Forth Blossom  [facility], [facility] Guide, Phone: [phone] Website: [email or web address]

## 2022-11-09 DIAGNOSIS — G8929 Other chronic pain: Secondary | ICD-10-CM | POA: Diagnosis not present

## 2022-11-09 DIAGNOSIS — R7303 Prediabetes: Secondary | ICD-10-CM | POA: Diagnosis not present

## 2022-11-09 DIAGNOSIS — Z79899 Other long term (current) drug therapy: Secondary | ICD-10-CM | POA: Diagnosis not present

## 2022-11-09 DIAGNOSIS — E559 Vitamin D deficiency, unspecified: Secondary | ICD-10-CM | POA: Diagnosis not present

## 2022-11-09 DIAGNOSIS — Z6824 Body mass index (BMI) 24.0-24.9, adult: Secondary | ICD-10-CM | POA: Diagnosis not present

## 2022-11-09 DIAGNOSIS — M25562 Pain in left knee: Secondary | ICD-10-CM | POA: Diagnosis not present

## 2022-11-09 DIAGNOSIS — M545 Low back pain, unspecified: Secondary | ICD-10-CM | POA: Diagnosis not present

## 2022-11-09 DIAGNOSIS — Z9181 History of falling: Secondary | ICD-10-CM | POA: Diagnosis not present

## 2022-11-09 DIAGNOSIS — M25561 Pain in right knee: Secondary | ICD-10-CM | POA: Diagnosis not present

## 2022-11-12 ENCOUNTER — Encounter: Payer: Self-pay | Admitting: Student

## 2022-11-12 ENCOUNTER — Ambulatory Visit: Payer: 59 | Admitting: Student

## 2022-11-12 VITALS — BP 112/80 | HR 81 | Ht 70.0 in | Wt 172.0 lb

## 2022-11-12 DIAGNOSIS — I10 Essential (primary) hypertension: Secondary | ICD-10-CM

## 2022-11-12 NOTE — Patient Instructions (Addendum)
It was great to see you! Thank you for allowing me to participate in your care!   Our plans for today:  - Please follow up with your cardiologist-return to care if having worsening shortness of breath, any chest pain, fevers/coughing - Please let us know if you start having dizziness or falls, we will not start any blood pressure medicines now.   Take care and seek immediate care sooner if you develop any concerns.  Levin Erp, MD

## 2022-11-12 NOTE — Progress Notes (Signed)
    SUBJECTIVE:   CHIEF COMPLAINT / HPI: ED and HTN follow up  ED visit on 10/16/22 for shortness of breath-treated with 5 day course of furosemide.  Last Echo 09/28/2022 showed EF of 55%, regional wall motion abnormalities in the LV, G1 DD, mildly elevated pulmonary artery pressure, normal valvular function Shortness of breath with walking-off and on for a while No chest pain when walking No coughing Does have orthopnea chronically States that the furosemide did help He is seeing cardiology on Wednesday  Recently got ambulatory blood pressure monitor which showed normal blood pressure 115/69 mmHg  Still on chronic pain meds  PERTINENT  PMH / PSH: HTN, COPD  OBJECTIVE:   BP 112/80   Pulse 81   Ht 5\' 10"  (1.778 m)   Wt 172 lb (78 kg)   SpO2 95%   BMI 24.68 kg/m   General: Well appearing, NAD, awake, alert, responsive to questions Head: Normocephalic atraumatic CV: Regular rate and rhythm no murmurs rubs or gallops Respiratory: Clear to ausculation bilaterally, no wheezes rales or crackles, chest rises symmetrically,  no increased work of breathing on RA Abdomen: Soft, non-tender, non-distended, normoactive bowel sounds  Extremities: Moves upper and lower extremities freely, no edema in LE  ASSESSMENT/PLAN:   HTN (hypertension) Recent syncopal admission.  Taken off all of his blood pressure medicines.  Ambulatory blood pressure monitor with normal blood pressure.  Asymptomatic currently. -Continue monitoring off of all medications -Reduce opioids if starts to have symptoms   Dyspnea On exertion with orthopnea. Weight is down. Heart failure vs. COPD vs. PAH from COPD -Cardiology follow up -If continuing recommend PFTs possible worsening of COPD/adjustment of inhalers  Levin Erp, MD [facility]

## 2022-11-12 NOTE — Assessment & Plan Note (Signed)
Recent syncopal admission.  Taken off all of his blood pressure medicines.  Ambulatory blood pressure monitor with normal blood pressure.  Asymptomatic currently. -Continue monitoring off of all medications -Reduce opioids if starts to have symptoms

## 2022-11-13 DIAGNOSIS — Z79899 Other long term (current) drug therapy: Secondary | ICD-10-CM | POA: Diagnosis not present

## 2022-11-14 ENCOUNTER — Ambulatory Visit: Payer: 59 | Attending: Cardiovascular Disease | Admitting: Cardiovascular Disease

## 2022-11-14 ENCOUNTER — Encounter: Payer: Self-pay | Admitting: Cardiovascular Disease

## 2022-11-14 VITALS — BP 138/82 | HR 92 | Ht 70.0 in | Wt 170.6 lb

## 2022-11-14 DIAGNOSIS — J432 Centrilobular emphysema: Secondary | ICD-10-CM | POA: Diagnosis not present

## 2022-11-14 DIAGNOSIS — E785 Hyperlipidemia, unspecified: Secondary | ICD-10-CM

## 2022-11-14 DIAGNOSIS — I25118 Atherosclerotic heart disease of native coronary artery with other forms of angina pectoris: Secondary | ICD-10-CM | POA: Diagnosis not present

## 2022-11-14 MED ORDER — FUROSEMIDE 40 MG PO TABS: 40.0000 mg | ORAL_TABLET | Freq: Every day | ORAL | 3 refills | Status: DC

## 2022-11-14 MED ORDER — ISOSORBIDE MONONITRATE ER 30 MG PO TB24: 30.0000 mg | ORAL_TABLET | Freq: Every day | ORAL | 3 refills | Status: DC

## 2022-11-14 NOTE — Patient Instructions (Addendum)
Medication Instructions:  START ISOSORBIDE MONONITRATE (IMDUR) 30 MG DAILY *If you need a refill on your cardiac medications before your next appointment, please call your pharmacy*  Follow-Up: At [facility], you and your health needs are our priority.  As part of our continuing mission to provide you with exceptional heart care, we have created designated Provider Care Teams.  These Care Teams include your primary Cardiologist (physician) and Advanced Practice Providers (APPs -  Physician Assistants and Nurse Practitioners) who all work together to provide you with the care you need, when you need it.  We recommend signing up for the patient portal called "MyChart".  Sign up information is provided on this After Visit Summary.  MyChart is used to connect with patients for Virtual Visits (Telemedicine).  Patients are able to view lab/test results, encounter notes, upcoming appointments, etc.  Non-urgent messages can be sent to your provider as well.   To learn more about what you can do with MyChart, go to [email or web address].    Your next appointment:   6 weeks with APP  1 YR With DR Croitoru

## 2022-11-14 NOTE — Progress Notes (Signed)
Cardiology Office Note:    Date:  11/14/2022   ID:  Joel Donaldson, DOB 1940/06/28, MRN [id number]  PCP:  Levin Erp, MD   [facility] Providers Cardiologist:  Thurmon Fair, MD     Referring MD: Levin Erp, MD   No chief complaint on file.    History of Present Illness:    Joel Donaldson is a 82 y.o. male with a hx of rheumatoid arthritis and prostate cancer treated with radioactive seed implantation, hypertension, COPD.  He does not have diabetes mellitus or hypercholesterolemia.  He has had a pattern of stable angina pectoris going on for over a year despite the fact that cardiac catheterization did not show severe epicardial coronary stenoses.  Symptoms were well-controlled when he was taking beta-blockers and metoprolol, but these were stopped apparently due to hypotension.  He now has mild exertional chest discomfort again.  This happens when he walks or when he becomes emotionally upset.  It does not interfere a lot with his activity level.  He is limited more by arthralgias involving his knees and hands from rheumatoid arthritis.  His ophthalmologist detected a Hollenhorst plaque in August 2024 and we recommended bilateral carotid duplex ultrasound, with normal findings.  He wore an event monitor in January 2024 that showed no evidence of atrial fibrillation although he did have frequent PACs representing 7% of all beats as well as several brief episodes of ectopic atrial tachycardia.  He was seen in the emergency room with complaints of shortness of breath 10/16/2022.  His chest x-ray did not show overt pulmonary edema but the ER doctor wondered whether the coarsened interstitial markings could represent mild heart failure.  He was in normal sinus rhythm.  The D-dimer was normal as was the BNP, but the ED physician started furosemide nonetheless.  09/05/2020 coronary CT angiogram which showed remarkably severe coronary atherosclerosis.  The calcium score  placing him in the 90th percentile for age/gender.  There was particularly severe calcium buildup in the LAD artery.  By morphological criteria, there was severe stenosis in the distal left circumflex coronary artery where there was a severe stenosis with mild stenoses in the right coronary artery, proximal-mid LAD and OM1.  However by FFR, significant reduction in flow was seen in all 3 major coronary territories.  Due to ongoing symptoms underwent cardiac catheterization 09/16/2020 which essentially confirmed the findings on CT angiography.  The most significant lesion was 60% stenosis in the AV groove portion of the left circumflex coronary artery without evidence of flow-limiting hemodynamics by CT FFR.  He had normal left ventricular filling pressures.  Echocardiogram shows normal left ventricular systolic function and wall motion with mild LVH.    Past Medical History:  Diagnosis Date   Acute nonintractable headache 05/31/2021   Alcohol abuse    Anxiety    Arthritis    hands, back, knees   Asthma    Atypical angina (HCC) (Class III) 12/19/2011   Baker's cyst of knee, right 06/21/2016   Chest pain at rest 10/04/2016   Depression    Epigastric pain 05/20/2020   Fall 10/23/2021   GERD (gastroesophageal reflux disease)    Glaucoma    Heart murmur asymptomatic   Hx of radioisotope therapy 07/26/2011   Radiosactive Prostate Seed Implant/ I-125 Seeds   Hypertension    Neck pain 10/22/2019   Prostate cancer (HCC) dx'd 2016 or 2017   seed implants   Rheumatoid arthritis (HCC) 10/10/2012    Past Surgical History:  Procedure  Laterality Date   CYSTOSCOPY  09/14/2011   Procedure: CYSTOSCOPY FLEXIBLE;  Surgeon: Lindaann Slough, MD;  Location: [facility];  Service: Urology;  Laterality: N/A;  No seeds seen in the bladder   LEFT HEART CATH AND CORONARY ANGIOGRAPHY N/A 09/16/2020   Procedure: LEFT HEART CATH AND CORONARY ANGIOGRAPHY;  Surgeon: Marykay Lex, MD;  Location:  [facility] INVASIVE CV LAB;  Service: Cardiovascular;  Laterality: N/A;   LUMBAR FUSION  1995   RADIOACTIVE SEED IMPLANT  09/14/2011   Procedure: RADIOACTIVE SEED IMPLANT;  Surgeon: Lindaann Slough, MD;  Location: [facility] ;  Service: Urology;  Laterality: N/A;  Total number of seeds -     SHOULDER SURGERY  YRS AGO   LEFT    Current Medications: Current Meds  Medication Sig   acetaminophen (TYLENOL) 500 MG tablet Take 2 tablets (1,000 mg total) by mouth every 6 (six) hours as needed. (Patient taking differently: Take 1,000 mg by mouth every 6 (six) hours as needed for moderate pain.)   albuterol (VENTOLIN HFA) 108 (90 Base) MCG/ACT inhaler INHALE 1 TO 2 PUFFS INTO THE LUNGS EVERY 4 HOURS AS NEEDED FOR WHEEZING OR SHORTNESS OF BREATH   aspirin EC 81 MG tablet Take 81 mg by mouth every morning.    atorvastatin (LIPITOR) 20 MG tablet TAKE 1 TABLET(20 MG) BY MOUTH DAILY (Patient taking differently: Take 20 mg by mouth daily.)   bacitracin ointment Apply 1 Application topically 2 (two) times daily.   buprenorphine (BUTRANS) 7.5 MCG/HR Place 1 patch onto the skin every Thursday.   diclofenac Sodium (VOLTAREN) 1 % GEL Apply 4 g topically 4 (four) times daily.   INCRUSE ELLIPTA 62.5 MCG/ACT AEPB INHALE 1 PUFF INTO THE LUNGS DAILY   isosorbide mononitrate (IMDUR) 30 MG 24 hr tablet Take 1 tablet (30 mg total) by mouth daily.   lidocaine (XYLOCAINE) 5 % ointment Apply 1 application topically daily as needed for mild pain or moderate pain.   Multiple Vitamins-Minerals (ONE-A-DAY PROACTIVE 65+) TABS Take 1 tablet by mouth daily with breakfast.   nitroGLYCERIN (NITROSTAT) 0.4 MG SL tablet Place 1 tablet (0.4 mg total) under the tongue every 5 (five) minutes as needed for chest pain (chest pain).   omeprazole (PRILOSEC) 40 MG capsule TAKE 1 CAPSULE(40 MG) BY MOUTH DAILY BEFORE BREAKFAST   oxyCODONE-acetaminophen (PERCOCET) 10-325 MG tablet Take 1 tablet by mouth every 4 (four) hours as needed for  pain.   polyethylene glycol (MIRALAX / GLYCOLAX) 17 g packet Take 17 g by mouth daily as needed for mild constipation.   [DISCONTINUED] furosemide (LASIX) 40 MG tablet Take 1 tablet (40 mg total) by mouth daily.     Allergies:   Tramadol and Pollen extract   Social History   Socioeconomic History   Marital status: Legally Separated    Spouse name: Pattricia Boss   Number of children: 1   Years of education: 11   Highest education level: Not on file  Occupational History   Occupation: Retired    Associate Professor: RETIRED  Tobacco Use   Smoking status: Former    Current packs/day: 0.00    Average packs/day: 2.0 packs/day for 51.7 years (103.3 ttl pk-yrs)    Types: Cigarettes    Start date: 02/12/1953    Quit date: 10/13/2004    Years since quitting: 18.0   Smokeless tobacco: Never   Tobacco comments:    Smoked 2 ppd  Vaping Use   Vaping status: Never Used  Substance and Sexual Activity  Alcohol use: Not Currently    Alcohol/week: 14.0 standard drinks of alcohol    Types: 14 Shots of liquor per week    Comment: 1/2 pint of liquor daily   Drug use: No   Sexual activity: Not Currently  Other Topics Concern   Not on file  Social History Narrative   Patient lives with his granddaughter in [place].   Patient is currently legally separated.    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    Patient enjoys seeing friends, hunting, and fishing.    Receives aide services 7 days week          Social Determinants of Health   Financial Resource Strain: Low Risk  (08/13/2022)   Overall Financial Resource Strain (CARDIA)    Difficulty of Paying Living Expenses: Not hard at all  Food Insecurity: No Food Insecurity (08/28/2022)   Hunger Vital Sign    Worried About Running Out of Food in the Last Year: Never true    Ran Out of Food in the Last Year: Never true  Transportation Needs: No Transportation Needs (08/28/2022)   PRAPARE - Administrator, Civil Service  (Medical): No    Lack of Transportation (Non-Medical): No  Physical Activity: Insufficiently Active (08/13/2022)   Exercise Vital Sign    Days of Exercise per Week: 3 days    Minutes of Exercise per Session: 30 min  Stress: No Stress Concern Present (08/13/2022)   [employer] of Occupational Health - Occupational Stress Questionnaire    Feeling of Stress : Not at all  Social Connections: Moderately Isolated (08/13/2022)   Social Connection and Isolation Panel [NHANES]    Frequency of Communication with Friends and Family: More than three times a week    Frequency of Social Gatherings with Friends and Family: Three times a week    Attends Religious Services: More than 4 times per year    Active Member of Clubs or Organizations: No    Attends Banker Meetings: Never    Marital Status: Separated     Family History: The patient's family history includes Diabetes in his mother; Heart disease in his father.  ROS:   Please see the history of present illness.     All other systems reviewed and are negative.  EKGs/Labs/Other Studies Reviewed:    The following studies were reviewed today:  Cardiac catheterization 09/16/2020   LPAV lesion is 60% stenosed.   Dist LM to Ost LAD lesion is 30% stenosed.   The left ventricular systolic function is normal.   SUMMARY Mild diffuse multivessel disease with no obvious significant stenosis.   Most notable stenosis is in the distal AV groove LCx roughly 60% (not positive by CT FFR). Significant systemic hypertension with normal LV EDP. Normal LV function with no obvious wall motion abnormality Suspect MICROVASCULAR DISEASE - ([place])  09/05/2020 Left main: 0   Left anterior descending artery: 989   Left circumflex artery: 259   Right coronary artery: 276   Total: 1523   Percentile: 90th   Other findings:   Normal pulmonary vein drainage into the left atrium.   Normal let atrial appendage without a thrombus.   Normal  size of the pulmonary artery.   IMPRESSION: 1. Coronary calcium score of 1523. This was 90th percentile for age-, race-, and sex-matched controls.   2. Normal coronary origin with left dominance.   3. There is severe (>70%) plaque in the distal LCX, and mild (25-49% stenosis in  RCA, LAD, and OM1. CAD-RADS 4. 1. Left Main: FFRct 1.0   2. LAD: FFRct 0.95 proximal, 0.89 mid, 0.8 distal   3. LCX: FFRct 0.98 proximal, 0.98 mid, 0.77 distal.  OM1 FFRct 0.8   4. RCA: There are two small vessels arising form the RCA ostium. FFRct for RCA1 is 0.85. FFRct for RCA2 is 0.92 proximally and 0.69 mid.   Echocardiogram 06/28/2020   1. Left ventricular ejection fraction, by estimation, is 55 to 60%. The  left ventricle has normal function. The left ventricle has no regional  wall motion abnormalities. There is mild left ventricular hypertrophy.  Left ventricular diastolic parameters  are consistent with Grade I diastolic dysfunction (impaired relaxation).   2. Right ventricular systolic function is normal. The right ventricular  size is normal.   3. The mitral valve is normal in structure. No evidence of mitral valve  regurgitation.   4. The aortic valve is tricuspid. Aortic valve regurgitation is mild.  Mild to moderate aortic valve sclerosis/calcification is present, without  any evidence of aortic stenosis.    EKG:  EKG is not ordered today.  The ekg ordered 10/16/2022 is personally reviewed and demonstrates normal sinus rhythm, normal ECG  Recent Labs: 08/28/2022: ALT 20; TSH 1.329 10/16/2022: B Natriuretic Peptide 44.2; BUN 11; Creatinine, Ser 0.82; Hemoglobin 12.2; Platelets 188; Potassium 3.8; Sodium 140  Recent Lipid Panel    Component Value Date/Time   CHOL 120 04/16/2022 1150   TRIG 65 04/16/2022 1150   HDL 57 04/16/2022 1150   CHOLHDL 2.1 04/16/2022 1150   CHOLHDL 3.2 01/18/2021 0434   VLDL 11 01/18/2021 0434   LDLCALC 49 04/16/2022 1150   LDLDIRECT 99 06/24/2007 2025      Risk Assessment/Calculations:           Physical Exam:    VS:  BP 138/82 (BP Location: Left Arm, Patient Position: Sitting, Cuff Size: Normal)   Pulse 92   Ht 5\' 10"  (1.778 m)   Wt 170 lb 9.6 oz (77.4 kg)   SpO2 96%   BMI 24.48 kg/m     Wt Readings from Last 3 Encounters:  11/14/22 170 lb 9.6 oz (77.4 kg)  11/12/22 172 lb (78 kg)  10/16/22 178 lb (80.7 kg)      General: Alert, oriented x3, no distress,  Head: no evidence of trauma, PERRL, EOMI, no exophtalmos or lid lag, no myxedema, no xanthelasma; normal ears, nose and oropharynx Neck: normal jugular venous pulsations and no hepatojugular reflux; brisk carotid pulses without delay and no carotid bruits Chest: Diminished breath sounds throughout, but otherwise clear to auscultation, no signs of consolidation by percussion or palpation, normal fremitus, symmetrical and full respiratory excursions Cardiovascular: normal position and quality of the apical impulse, regular rhythm, normal first and second heart sounds, no murmurs, rubs or gallops Abdomen: no tenderness or distention, no masses by palpation, no abnormal pulsatility or arterial bruits, normal bowel sounds, no hepatosplenomegaly Extremities: Normal distal pulses, no edema.  He has prominent joints deformity and swelling especially in bilateral MCPs consistent with rheumatoid arthritis Psych: Normal mood and affect    ASSESSMENT:    1. Coronary artery disease of native artery of native heart with stable angina pectoris (HCC)   2. Hyperlipidemia LDL goal <70   3. Centrilobular emphysema (HCC)        PLAN:    In order of problems listed above:  CAD/angina/dyspnea on exertion: No severe stenoses in major epicardial arteries on coronary angiography or CT angiography.  May have small vessel disease.  Symptoms were controlled on amlodipine and metoprolol, but these medications were stopped, reportedly due to low blood pressure.  He is having exertional chest  discomfort again.  Will start isosorbide mononitrate 30 mg daily, with an option to switch to or add ranolazine if necessary. HLP: Labs earlier this year showed excellent LDL cholesterol at 49. COPD: Uses albuterol just before the visit today which probably explains why his heart rate is a little fast.  He is no longer on beta-blockers but does not complain much of palpitations.  I think his shortness of breath is likely from emphysema and his heart failure is highly unlikely with a BNP of 44.Use furosemide as needed only when he has obvious swelling.   Medication Adjustments/Labs and Tests Ordered: Current medicines are reviewed at length with the patient today.  Concerns regarding medicines are outlined above.  No orders of the defined types were placed in this encounter.    Meds ordered this encounter  Medications   isosorbide mononitrate (IMDUR) 30 MG 24 hr tablet    Sig: Take 1 tablet (30 mg total) by mouth daily.    Dispense:  90 tablet    Refill:  3   furosemide (LASIX) 40 MG tablet    Sig: Take 1 tablet (40 mg total) by mouth daily.    Dispense:  90 tablet    Refill:  3       Patient Instructions  Medication Instructions:  START ISOSORBIDE MONONITRATE (IMDUR) 30 MG DAILY *If you need a refill on your cardiac medications before your next appointment, please call your pharmacy*  Follow-Up: At [facility], you and your health needs are our priority.  As part of our continuing mission to provide you with exceptional heart care, we have created designated Provider Care Teams.  These Care Teams include your primary Cardiologist (physician) and Advanced Practice Providers (APPs -  Physician Assistants and Nurse Practitioners) who all work together to provide you with the care you need, when you need it.  We recommend signing up for the patient portal called "MyChart".  Sign up information is provided on this After Visit Summary.  MyChart is used to connect with patients  for Virtual Visits (Telemedicine).  Patients are able to view lab/test results, encounter notes, upcoming appointments, etc.  Non-urgent messages can be sent to your provider as well.   To learn more about what you can do with MyChart, go to [email or web address].    Your next appointment:   6 weeks with APP  1 YR With DR Jaydyn Bozzo   Signed, Thurmon Fair, MD  11/14/2022 11:06 AM    [facility]

## 2022-12-04 ENCOUNTER — Other Ambulatory Visit: Payer: Self-pay | Admitting: Student

## 2022-12-07 DIAGNOSIS — E559 Vitamin D deficiency, unspecified: Secondary | ICD-10-CM | POA: Diagnosis not present

## 2022-12-07 DIAGNOSIS — Z79899 Other long term (current) drug therapy: Secondary | ICD-10-CM | POA: Diagnosis not present

## 2022-12-07 DIAGNOSIS — M25562 Pain in left knee: Secondary | ICD-10-CM | POA: Diagnosis not present

## 2022-12-07 DIAGNOSIS — Z9181 History of falling: Secondary | ICD-10-CM | POA: Diagnosis not present

## 2022-12-07 DIAGNOSIS — R7303 Prediabetes: Secondary | ICD-10-CM | POA: Diagnosis not present

## 2022-12-07 DIAGNOSIS — M545 Low back pain, unspecified: Secondary | ICD-10-CM | POA: Diagnosis not present

## 2022-12-07 DIAGNOSIS — M25561 Pain in right knee: Secondary | ICD-10-CM | POA: Diagnosis not present

## 2022-12-07 DIAGNOSIS — Z6824 Body mass index (BMI) 24.0-24.9, adult: Secondary | ICD-10-CM | POA: Diagnosis not present

## 2022-12-07 DIAGNOSIS — G8929 Other chronic pain: Secondary | ICD-10-CM | POA: Diagnosis not present

## 2022-12-11 DIAGNOSIS — Z79899 Other long term (current) drug therapy: Secondary | ICD-10-CM | POA: Diagnosis not present

## 2022-12-18 ENCOUNTER — Other Ambulatory Visit: Payer: Self-pay | Admitting: Student

## 2022-12-18 DIAGNOSIS — J449 Chronic obstructive pulmonary disease, unspecified: Secondary | ICD-10-CM

## 2022-12-25 NOTE — Progress Notes (Signed)
Cardiology Clinic Note   Patient Name: Joel Donaldson Date of Encounter: 12/28/2022  Primary Care Provider:  Levin Erp, MD Primary Cardiologist:  Thurmon Fair, MD  Patient Profile    Joel Donaldson 82 year old male presents to the clinic today for follow-up evaluation of his chest discomfort.  Past Medical History    Past Medical History:  Diagnosis Date   Acute nonintractable headache 05/31/2021   Alcohol abuse    Anxiety    Arthritis    hands, back, knees   Asthma    Atypical angina (HCC) (Class III) 12/19/2011   Baker's cyst of knee, right 06/21/2016   Chest pain at rest 10/04/2016   Depression    Epigastric pain 05/20/2020   Fall 10/23/2021   GERD (gastroesophageal reflux disease)    Glaucoma    Heart murmur asymptomatic   Hx of radioisotope therapy 07/26/2011   Radiosactive Prostate Seed Implant/ I-125 Seeds   Hypertension    Neck pain 10/22/2019   Prostate cancer (HCC) dx'd 2016 or 2017   seed implants   Rheumatoid arthritis (HCC) 10/10/2012   Past Surgical History:  Procedure Laterality Date   CYSTOSCOPY  09/14/2011   Procedure: CYSTOSCOPY FLEXIBLE;  Surgeon: Lindaann Slough, MD;  Location: [facility];  Service: Urology;  Laterality: N/A;  No seeds seen in the bladder   LEFT HEART CATH AND CORONARY ANGIOGRAPHY N/A 09/16/2020   Procedure: LEFT HEART CATH AND CORONARY ANGIOGRAPHY;  Surgeon: Marykay Lex, MD;  Location: [facility] INVASIVE CV LAB;  Service: Cardiovascular;  Laterality: N/A;   LUMBAR FUSION  1995   RADIOACTIVE SEED IMPLANT  09/14/2011   Procedure: RADIOACTIVE SEED IMPLANT;  Surgeon: Lindaann Slough, MD;  Location: [facility];  Service: Urology;  Laterality: N/A;  Total number of seeds -     SHOULDER SURGERY  YRS AGO   LEFT    Allergies  Allergies  Allergen Reactions   Tramadol Nausea Only   Pollen Extract Other (See Comments)    Sneezing    History of Present Illness    Joel Donaldson has a  PMH of rheumatoid arthritis, hypertension, COPD, prostate cancer with radioactive seed implantation, diabetes, HLD, and chest discomfort.  He wore a cardiac event monitor 1/24 which showed no evidence of atrial fibrillation, 7% PACs and episodes of ectopic atrial tachycardia.  He underwent cardiac catheterization 09/16/2020 which showed 60% stenosis along AV groove portion of the left circumflex.  No evidence of flow limitation was noted via CT FFR.  Normal LV ventricular filling pressures were noted.  Mild LVH was seen.  He was seen in the emergency department 10/16/2022 with shortness of breath.  Chest x-ray showed pulmonary edema.  However, ED doc wondered whether coarsened interstitial markings could represent mild heart failure.  He was noted to be in normal sinus rhythm.  His D-dimer was normal.  His BNP was normal.  He has known stable angina which has been ongoing for over a year.  He underwent cardiac catheterization which showed severe pericardial coronary stenosis  He was seen in follow-up by Dr. Royann Shivers on 11/14/22.  He had been taken off of beta-blocker therapy due to hypotension.  His mild exertional chest discomfort had come back.  He reported chest discomfort with walking and emotional upset.  It was not interfering with his daily activities.  He reported more limitation from joint pain involving his knees and hands as well as his RA.  He presents to the clinic today  for follow-up evaluation and states he occasionally has shortness of breath with walking and at rest when he has anxiety.  We reviewed his previous clinic visit and his medications.  He expressed understanding.  His blood pressure today is 92/68 and his pulse is 87.  He has been following a heart healthy diet.  He denies episodes of chest discomfort.  He is tolerating his medications well without side effects.  I have asked him to increase his p.o. hydration, slightly increase the sodium in his diet and we will plan follow-up in 6  months.  Today he denies chest pain, shortness of breath, lower extremity edema, fatigue, palpitations, melena, hematuria, hemoptysis, diaphoresis, weakness, presyncope, syncope, orthopnea, and PND.   Home Medications    Prior to Admission medications   Medication Sig Start Date End Date Taking? Authorizing Provider  acetaminophen (TYLENOL) 500 MG tablet Take 2 tablets (1,000 mg total) by mouth every 6 (six) hours as needed. Patient taking differently: Take 1,000 mg by mouth every 6 (six) hours as needed for moderate pain. 10/14/21   Arby Barrette, MD  albuterol (VENTOLIN HFA) 108 (90 Base) MCG/ACT inhaler INHALE 1 TO 2 PUFFS INTO THE LUNGS EVERY 4 HOURS AS NEEDED FOR WHEEZING OR SHORTNESS OF BREATH 10/17/22   Levin Erp, MD  aspirin EC 81 MG tablet Take 81 mg by mouth every morning.     [provider]  atorvastatin (LIPITOR) 20 MG tablet TAKE 1 TABLET(20 MG) BY MOUTH DAILY Patient taking differently: Take 20 mg by mouth daily. 07/16/22   Croitoru, Mihai, MD  bacitracin ointment Apply 1 Application topically 2 (two) times daily. 10/14/21   Arby Barrette, MD  buprenorphine Lavera Guise) 7.5 MCG/HR Place 1 patch onto the skin every Thursday. 05/17/20   [provider]  diclofenac Sodium (VOLTAREN) 1 % GEL Apply 4 g topically 4 (four) times daily. 01/10/21   Brimage, Seward Meth, DO  furosemide (LASIX) 40 MG tablet Take 1 tablet (40 mg total) by mouth daily. 11/14/22   Croitoru, Mihai, MD  INCRUSE ELLIPTA 62.5 MCG/ACT AEPB INHALE 1 PUFF INTO THE LUNGS DAILY 12/19/22   Levin Erp, MD  isosorbide mononitrate (IMDUR) 30 MG 24 hr tablet Take 1 tablet (30 mg total) by mouth daily. 11/14/22   Croitoru, Mihai, MD  lidocaine (XYLOCAINE) 5 % ointment Apply 1 application topically daily as needed for mild pain or moderate pain. 01/10/21   Katha Cabal, DO  Multiple Vitamins-Minerals (ONE-A-DAY PROACTIVE 65+) TABS Take 1 tablet by mouth daily with breakfast.    [provider]   nitroGLYCERIN (NITROSTAT) 0.4 MG SL tablet Place 1 tablet (0.4 mg total) under the tongue every 5 (five) minutes as needed for chest pain (chest pain). 05/25/21   Brimage, Seward Meth, DO  omeprazole (PRILOSEC) 40 MG capsule TAKE 1 CAPSULE(40 MG) BY MOUTH DAILY BEFORE BREAKFAST 12/05/22   Levin Erp, MD  oxyCODONE-acetaminophen (PERCOCET) 10-325 MG tablet Take 1 tablet by mouth every 4 (four) hours as needed for pain. 02/26/22   [provider]  polyethylene glycol (MIRALAX / GLYCOLAX) 17 g packet Take 17 g by mouth daily as needed for mild constipation. 01/10/21   Katha Cabal, DO    Family History    Family History  Problem Relation Age of Onset   Diabetes Mother    Heart disease Father        lived to 23 - had bypass, pacemaker   He indicated that his mother is deceased. He indicated that his father is deceased. He indicated  that his brother is deceased.  Social History    Social History   Socioeconomic History   Marital status: Legally Separated    Spouse name: Pattricia Boss   Number of children: 1   Years of education: 11   Highest education level: Not on file  Occupational History   Occupation: Retired    Associate Professor: RETIRED  Tobacco Use   Smoking status: Former    Current packs/day: 0.00    Average packs/day: 2.0 packs/day for 51.7 years (103.3 ttl pk-yrs)    Types: Cigarettes    Start date: 02/12/1953    Quit date: 10/13/2004    Years since quitting: 18.2   Smokeless tobacco: Never   Tobacco comments:    Smoked 2 ppd  Vaping Use   Vaping status: Never Used  Substance and Sexual Activity   Alcohol use: Not Currently    Alcohol/week: 14.0 standard drinks of alcohol    Types: 14 Shots of liquor per week    Comment: 1/2 pint of liquor daily   Drug use: No   Sexual activity: Not Currently  Other Topics Concern   Not on file  Social History Narrative   Patient lives with his granddaughter in [place].   Patient is currently legally separated.    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    Patient enjoys seeing friends, hunting, and fishing.    Receives aide services 7 days week          Social Determinants of Health   Financial Resource Strain: Low Risk  (08/13/2022)   Overall Financial Resource Strain (CARDIA)    Difficulty of Paying Living Expenses: Not hard at all  Food Insecurity: No Food Insecurity (08/28/2022)   Hunger Vital Sign    Worried About Running Out of Food in the Last Year: Never true    Ran Out of Food in the Last Year: Never true  Transportation Needs: No Transportation Needs (08/28/2022)   PRAPARE - Administrator, Civil Service (Medical): No    Lack of Transportation (Non-Medical): No  Physical Activity: Insufficiently Active (08/13/2022)   Exercise Vital Sign    Days of Exercise per Week: 3 days    Minutes of Exercise per Session: 30 min  Stress: No Stress Concern Present (08/13/2022)   [employer] of Occupational Health - Occupational Stress Questionnaire    Feeling of Stress : Not at all  Social Connections: Moderately Isolated (08/13/2022)   Social Connection and Isolation Panel [NHANES]    Frequency of Communication with Friends and Family: More than three times a week    Frequency of Social Gatherings with Friends and Family: Three times a week    Attends Religious Services: More than 4 times per year    Active Member of Clubs or Organizations: No    Attends Banker Meetings: Never    Marital Status: Separated  Intimate Partner Violence: Not At Risk (08/28/2022)   Humiliation, Afraid, Rape, and Kick questionnaire    Fear of Current or Ex-Partner: No    Emotionally Abused: No    Physically Abused: No    Sexually Abused: No     Review of Systems    General:  No chills, fever, night sweats or weight changes.  Cardiovascular:  No chest pain, dyspnea on exertion, edema, orthopnea, palpitations, paroxysmal nocturnal dyspnea. Dermatological: No rash,  lesions/masses Respiratory: No cough, dyspnea Urologic: No hematuria, dysuria Abdominal:   No nausea, vomiting, diarrhea, bright red blood per rectum, melena,  or hematemesis Neurologic:  No visual changes, wkns, changes in mental status. All other systems reviewed and are otherwise negative except as noted above.  Physical Exam    VS:  BP 92/68 (BP Location: Left Arm, Patient Position: Sitting, Cuff Size: Normal)   Pulse 87   Ht 5\' 10"  (1.778 m)   Wt 175 lb (79.4 kg)   BMI 25.11 kg/m  , BMI Body mass index is 25.11 kg/m. GEN: Well nourished, well developed, in no acute distress. HEENT: normal. Neck: Supple, no JVD, carotid bruits, or masses. Cardiac: RRR, no murmurs, rubs, or gallops. No clubbing, cyanosis, edema.  Radials/DP/PT 2+ and equal bilaterally.  Respiratory:  Respirations regular and unlabored, clear to auscultation bilaterally. GI: Soft, nontender, nondistended, BS + x 4. MS: no deformity or atrophy. Skin: warm and dry, no rash. Neuro:  Strength and sensation are intact. Psych: Normal affect.  Accessory Clinical Findings    Recent Labs: 08/28/2022: ALT 20; TSH 1.329 10/16/2022: B Natriuretic Peptide 44.2; BUN 11; Creatinine, Ser 0.82; Hemoglobin 12.2; Platelets 188; Potassium 3.8; Sodium 140   Recent Lipid Panel    Component Value Date/Time   CHOL 120 04/16/2022 1150   TRIG 65 04/16/2022 1150   HDL 57 04/16/2022 1150   CHOLHDL 2.1 04/16/2022 1150   CHOLHDL 3.2 01/18/2021 0434   VLDL 11 01/18/2021 0434   LDLCALC 49 04/16/2022 1150   LDLDIRECT 99 06/24/2007 2025         ECG personally reviewed by me today- None today.  Cardiac catheterization 09/16/2020    LPAV lesion is 60% stenosed.   Dist LM to Ost LAD lesion is 30% stenosed.   The left ventricular systolic function is normal.   SUMMARY Mild diffuse multivessel disease with no obvious significant stenosis.   Most notable stenosis is in the distal AV groove LCx roughly 60% (not positive by CT  FFR). Significant systemic hypertension with normal LV EDP. Normal LV function with no obvious wall motion abnormality Suspect MICROVASCULAR DISEASE - ([place])   Bryan Lemma, MD  Diagnostic Dominance: Left  Intervention       Assessment & Plan   1.  Coronary artery disease-no chest pain today.  Notes improvement with isosorbide.  Underwent cardiac catheterization  09/16/20 which showed LPA V lesion 60%, distal-ostial LAD 30% and normal LV function Continue Imdur, atorvastatin Heart healthy low-sodium diet  DOE, COPD-breathing has returned to baseline. Increase physical activity as tolerated Continue albuterol inhaler as needed  Hyperlipidemia-LDL 49 on 3/24. High-fiber diet Aspirin, atorvastatin  Syncope-denies episodes of lightheadedness, presyncope or syncope.  Slightly increased dietary sodium Change positions slowly Maintain p.o. hydration Continue to monitor  Disposition: Follow-up with Dr. Royann Shivers or me in 6 months.   Thomasene Ripple. Sabah Zucco NP-C     12/28/2022, 9:08 AM [facility] [address] Office [phone] Fax [phone]    I spent 14 minutes examining this patient, reviewing medications, and using patient centered shared decision making involving her cardiac care.   I spent greater than 20 minutes reviewing her past medical history,  medications, and prior cardiac tests.

## 2022-12-28 ENCOUNTER — Encounter: Payer: Self-pay | Admitting: General Practice

## 2022-12-28 ENCOUNTER — Ambulatory Visit: Payer: 59 | Attending: General Practice | Admitting: General Practice

## 2022-12-28 VITALS — BP 92/68 | HR 87 | Ht 70.0 in | Wt 175.0 lb

## 2022-12-28 DIAGNOSIS — I25118 Atherosclerotic heart disease of native coronary artery with other forms of angina pectoris: Secondary | ICD-10-CM

## 2022-12-28 DIAGNOSIS — J42 Unspecified chronic bronchitis: Secondary | ICD-10-CM | POA: Diagnosis not present

## 2022-12-28 DIAGNOSIS — R55 Syncope and collapse: Secondary | ICD-10-CM | POA: Diagnosis not present

## 2022-12-28 DIAGNOSIS — E785 Hyperlipidemia, unspecified: Secondary | ICD-10-CM | POA: Diagnosis not present

## 2022-12-28 DIAGNOSIS — R0609 Other forms of dyspnea: Secondary | ICD-10-CM

## 2022-12-28 NOTE — Patient Instructions (Signed)
Medication Instructions:  No changes today *If you need a refill on your cardiac medications before your next appointment, please call your pharmacy*     Follow-Up: At [facility], you and your health needs are our priority.  As part of our continuing mission to provide you with exceptional heart care, we have created designated Provider Care Teams.  These Care Teams include your primary Cardiologist (physician) and Advanced Practice Providers (APPs -  Physician Assistants and Nurse Practitioners) who all work together to provide you with the care you need, when you need it.  We recommend signing up for the patient portal called "MyChart".  Sign up information is provided on this After Visit Summary.  MyChart is used to connect with patients for Virtual Visits (Telemedicine).  Patients are able to view lab/test results, encounter notes, upcoming appointments, etc.  Non-urgent messages can be sent to your provider as well.   To learn more about what you can do with MyChart, go to [email or web address].    Your next appointment:   6 month(s)  Provider:   Edd Fabian, FNP      Other Instructions Exercising to Stay Healthy To become healthy and stay healthy, it is recommended that you do moderate-intensity and vigorous-intensity exercise. You can tell that you are exercising at a moderate intensity if your heart starts beating faster and you start breathing faster but can still hold a conversation. You can tell that you are exercising at a vigorous intensity if you are breathing much harder and faster and cannot hold a conversation while exercising. How can exercise benefit me? Exercising regularly is important. It has many health benefits, such as: Improving overall fitness, flexibility, and endurance. Increasing bone density. Helping with weight control. Decreasing body fat. Increasing muscle strength and endurance. Reducing stress and tension, anxiety, depression, or  anger. Improving overall health. What guidelines should I follow while exercising? Before you start a new exercise program, talk with your health care provider. Do not exercise so much that you hurt yourself, feel dizzy, or get very short of breath. Wear comfortable clothes and wear shoes with good support. Drink plenty of water while you exercise to prevent dehydration or heat stroke. Work out until your breathing and your heartbeat get faster (moderate intensity). How often should I exercise? Choose an activity that you enjoy, and set realistic goals. Your health care provider can help you make an activity plan that is individually designed and works best for you. Exercise regularly as told by your health care provider. This may include: Doing strength training two times a week, such as: Lifting weights. Using resistance bands. Push-ups. Sit-ups. Yoga. Doing a certain intensity of exercise for a given amount of time. Choose from these options: A total of 150 minutes of moderate-intensity exercise every week. A total of 75 minutes of vigorous-intensity exercise every week. A mix of moderate-intensity and vigorous-intensity exercise every week. Children, pregnant women, people who have not exercised regularly, people who are overweight, and older adults may need to talk with a health care provider about what activities are safe to perform. If you have a medical condition, be sure to talk with your health care provider before you start a new exercise program. What are some exercise ideas? Moderate-intensity exercise ideas include: Walking 1 mile (1.6 km) in about 15 minutes. Biking. Hiking. Golfing. Dancing. Water aerobics. Vigorous-intensity exercise ideas include: Walking 4.5 miles (7.2 km) or more in about 1 hour. Jogging or running 5 miles (8 km) in about  1 hour. Biking 10 miles (16.1 km) or more in about 1 hour. Lap swimming. Roller-skating or in-line skating. Cross-country  skiing. Vigorous competitive sports, such as football, basketball, and soccer. Jumping rope. Aerobic dancing. What are some everyday activities that can help me get exercise? Yard work, such as: Child psychotherapist. Raking and bagging leaves. Washing your car. Pushing a stroller. Shoveling snow. Gardening. Washing windows or floors. How can I be more active in my day-to-day activities? Use stairs instead of an elevator. Take a walk during your lunch break. If you drive, park your car farther away from your work or school. If you take public transportation, get off one stop early and walk the rest of the way. Stand up or walk around during all of your indoor phone calls. Get up, stretch, and walk around every 30 minutes throughout the day. Enjoy exercise with a friend. Support to continue exercising will help you keep a regular routine of activity. Where to find more information You can find more information about exercising to stay healthy from: U.S. Department of Health and Human Services: ThisPath.fi Centers for Disease Control and Prevention (CDC): FootballExhibition.com.br Summary Exercising regularly is important. It will improve your overall fitness, flexibility, and endurance. Regular exercise will also improve your overall health. It can help you control your weight, reduce stress, and improve your bone density. Do not exercise so much that you hurt yourself, feel dizzy, or get very short of breath. Before you start a new exercise program, talk with your health care provider. This information is not intended to replace advice given to you by your health care provider. Make sure you discuss any questions you have with your health care provider. Document Revised: 05/27/2020 Document Reviewed: 05/27/2020 Elsevier Patient Education  2024 Elsevier Inc.  Keep a cup of water close to sip on during the day to prevent dehydration . Also can add a little more sodium to your diet to prevent low  blood pressure.

## 2023-01-07 DIAGNOSIS — E559 Vitamin D deficiency, unspecified: Secondary | ICD-10-CM | POA: Diagnosis not present

## 2023-01-07 DIAGNOSIS — Z79899 Other long term (current) drug therapy: Secondary | ICD-10-CM | POA: Diagnosis not present

## 2023-01-07 DIAGNOSIS — M25561 Pain in right knee: Secondary | ICD-10-CM | POA: Diagnosis not present

## 2023-01-07 DIAGNOSIS — G8929 Other chronic pain: Secondary | ICD-10-CM | POA: Diagnosis not present

## 2023-01-07 DIAGNOSIS — M25562 Pain in left knee: Secondary | ICD-10-CM | POA: Diagnosis not present

## 2023-01-07 DIAGNOSIS — M545 Low back pain, unspecified: Secondary | ICD-10-CM | POA: Diagnosis not present

## 2023-01-07 DIAGNOSIS — Z9181 History of falling: Secondary | ICD-10-CM | POA: Diagnosis not present

## 2023-01-07 DIAGNOSIS — R7303 Prediabetes: Secondary | ICD-10-CM | POA: Diagnosis not present

## 2023-01-09 ENCOUNTER — Other Ambulatory Visit: Payer: Self-pay | Admitting: Student

## 2023-01-09 DIAGNOSIS — J449 Chronic obstructive pulmonary disease, unspecified: Secondary | ICD-10-CM

## 2023-01-09 DIAGNOSIS — Z79899 Other long term (current) drug therapy: Secondary | ICD-10-CM | POA: Diagnosis not present

## 2023-01-21 ENCOUNTER — Ambulatory Visit: Payer: 59 | Admitting: Podiatry

## 2023-01-31 DIAGNOSIS — M25561 Pain in right knee: Secondary | ICD-10-CM | POA: Diagnosis not present

## 2023-01-31 DIAGNOSIS — G8929 Other chronic pain: Secondary | ICD-10-CM | POA: Diagnosis not present

## 2023-01-31 DIAGNOSIS — Z9181 History of falling: Secondary | ICD-10-CM | POA: Diagnosis not present

## 2023-01-31 DIAGNOSIS — R030 Elevated blood-pressure reading, without diagnosis of hypertension: Secondary | ICD-10-CM | POA: Diagnosis not present

## 2023-01-31 DIAGNOSIS — M545 Low back pain, unspecified: Secondary | ICD-10-CM | POA: Diagnosis not present

## 2023-01-31 DIAGNOSIS — M25562 Pain in left knee: Secondary | ICD-10-CM | POA: Diagnosis not present

## 2023-01-31 DIAGNOSIS — Z79899 Other long term (current) drug therapy: Secondary | ICD-10-CM | POA: Diagnosis not present

## 2023-01-31 DIAGNOSIS — R7303 Prediabetes: Secondary | ICD-10-CM | POA: Diagnosis not present

## 2023-02-04 DIAGNOSIS — Z79899 Other long term (current) drug therapy: Secondary | ICD-10-CM | POA: Diagnosis not present

## 2023-02-14 DIAGNOSIS — I209 Angina pectoris, unspecified: Secondary | ICD-10-CM | POA: Diagnosis not present

## 2023-03-04 DIAGNOSIS — Z79899 Other long term (current) drug therapy: Secondary | ICD-10-CM | POA: Diagnosis not present

## 2023-03-04 DIAGNOSIS — M545 Low back pain, unspecified: Secondary | ICD-10-CM | POA: Diagnosis not present

## 2023-03-04 DIAGNOSIS — E559 Vitamin D deficiency, unspecified: Secondary | ICD-10-CM | POA: Diagnosis not present

## 2023-03-04 DIAGNOSIS — R7303 Prediabetes: Secondary | ICD-10-CM | POA: Diagnosis not present

## 2023-03-04 DIAGNOSIS — G8929 Other chronic pain: Secondary | ICD-10-CM | POA: Diagnosis not present

## 2023-03-04 DIAGNOSIS — Z0000 Encounter for general adult medical examination without abnormal findings: Secondary | ICD-10-CM | POA: Diagnosis not present

## 2023-03-04 DIAGNOSIS — M25562 Pain in left knee: Secondary | ICD-10-CM | POA: Diagnosis not present

## 2023-03-04 DIAGNOSIS — M25561 Pain in right knee: Secondary | ICD-10-CM | POA: Diagnosis not present

## 2023-03-04 DIAGNOSIS — Z9181 History of falling: Secondary | ICD-10-CM | POA: Diagnosis not present

## 2023-03-07 ENCOUNTER — Other Ambulatory Visit: Payer: Self-pay | Admitting: Student

## 2023-03-07 DIAGNOSIS — Z79899 Other long term (current) drug therapy: Secondary | ICD-10-CM | POA: Diagnosis not present

## 2023-03-14 ENCOUNTER — Other Ambulatory Visit: Payer: Self-pay | Admitting: Cardiovascular Disease

## 2023-03-18 DIAGNOSIS — I209 Angina pectoris, unspecified: Secondary | ICD-10-CM | POA: Diagnosis not present

## 2023-03-21 ENCOUNTER — Other Ambulatory Visit: Payer: Self-pay | Admitting: Student

## 2023-03-21 DIAGNOSIS — J449 Chronic obstructive pulmonary disease, unspecified: Secondary | ICD-10-CM

## 2023-03-25 ENCOUNTER — Encounter: Payer: Self-pay | Admitting: Student

## 2023-03-25 ENCOUNTER — Ambulatory Visit (INDEPENDENT_AMBULATORY_CARE_PROVIDER_SITE_OTHER): Payer: 59 | Admitting: Student

## 2023-03-25 VITALS — BP 103/64 | HR 85 | Ht 70.0 in | Wt 181.2 lb

## 2023-03-25 DIAGNOSIS — M25562 Pain in left knee: Secondary | ICD-10-CM | POA: Diagnosis not present

## 2023-03-25 DIAGNOSIS — R079 Chest pain, unspecified: Secondary | ICD-10-CM | POA: Diagnosis not present

## 2023-03-25 DIAGNOSIS — G8929 Other chronic pain: Secondary | ICD-10-CM

## 2023-03-25 DIAGNOSIS — M25561 Pain in right knee: Secondary | ICD-10-CM

## 2023-03-25 MED ORDER — ISOSORBIDE MONONITRATE ER 30 MG PO TB24: 30.0000 mg | ORAL_TABLET | Freq: Every day | ORAL | 3 refills | Status: DC

## 2023-03-25 NOTE — Addendum Note (Signed)
 Addended by: Rayhaan Huster on: 03/25/2023 10:12 AM   Modules accepted: Orders

## 2023-03-25 NOTE — Progress Notes (Signed)
    SUBJECTIVE:   CHIEF COMPLAINT / HPI: knee and chest pain  Had chest pain three to four days ago. The pain is described as a dull. The pain lasted for a while before resolving. Occurred after walking. Resolved with rest. He has been out of his Imdur  medication for the same duration, which he believes helps with his pain usually. No current chest pain. Last saw cardiology in November 2024  In addition to the chest pain, the patient also reports chronic knee pain, particularly when walking. He expresses interest in gel treatment he saw on television, but has not had a steroid injection in his knee for quite some time.  PERTINENT  PMH / PSH: COPD, HLD, CAD, GERD  OBJECTIVE:   BP 103/64   Pulse 85   Ht 5\' 10"  (1.778 m)   Wt 181 lb 4 oz (82.2 kg)   SpO2 100%   BMI 26.01 kg/m   General: NAD, awake, alert, responsive to questions Head: Normocephalic atraumatic CV: Regular rate and rhythm no murmurs rubs or gallops Respiratory: Clear to ausculation bilaterally, no wheezes rales or crackles, chest rises symmetrically,  no increased work of breathing Extremities: Moves upper and lower extremities freely, uses cane for ambulation, no knee swelling, crepitus present with movement of joint, well perfused  ASSESSMENT/PLAN:   Assessment & Plan Chest pain, unspecified type Hx CAD + 60% stenosis of LPAV lesion on cath from 2022. EKG stable without ST/T wave changes from prior. -refilled Imdur  -Advised cardiology follow up Chronic pain of both knees Pt interested in gel injection in knees. -Tylenol  PRN given CAD hx avoiding NSAID -Referral to orthopedics for consideration    Genora Kidd, MD [facility]

## 2023-03-25 NOTE — Patient Instructions (Addendum)
 It was great to see you! Thank you for allowing me to participate in your care!   Our plans for today:  - I refilled your imdur  medication - EKG looks good, follow up with cardiology - Referring you to orthopedics for considering gel injection  Take care and seek immediate care sooner if you develop any concerns.  Genora Kidd, MD

## 2023-03-26 ENCOUNTER — Ambulatory Visit (HOSPITAL_COMMUNITY)
Admission: RE | Admit: 2023-03-26 | Discharge: 2023-03-26 | Disposition: A | Discharge status: Home / Self Care | Payer: 59 | Source: Ambulatory Visit | Attending: Family Medicine | Admitting: Family Medicine

## 2023-03-26 DIAGNOSIS — R079 Chest pain, unspecified: Secondary | ICD-10-CM | POA: Insufficient documentation

## 2023-04-01 DIAGNOSIS — M25561 Pain in right knee: Secondary | ICD-10-CM | POA: Diagnosis not present

## 2023-04-01 DIAGNOSIS — M25562 Pain in left knee: Secondary | ICD-10-CM | POA: Diagnosis not present

## 2023-04-01 DIAGNOSIS — E559 Vitamin D deficiency, unspecified: Secondary | ICD-10-CM | POA: Diagnosis not present

## 2023-04-01 DIAGNOSIS — G894 Chronic pain syndrome: Secondary | ICD-10-CM | POA: Diagnosis not present

## 2023-04-01 DIAGNOSIS — R7303 Prediabetes: Secondary | ICD-10-CM | POA: Diagnosis not present

## 2023-04-01 DIAGNOSIS — G8929 Other chronic pain: Secondary | ICD-10-CM | POA: Diagnosis not present

## 2023-04-01 DIAGNOSIS — Z79899 Other long term (current) drug therapy: Secondary | ICD-10-CM | POA: Diagnosis not present

## 2023-04-01 DIAGNOSIS — Z9181 History of falling: Secondary | ICD-10-CM | POA: Diagnosis not present

## 2023-04-01 DIAGNOSIS — M545 Low back pain, unspecified: Secondary | ICD-10-CM | POA: Diagnosis not present

## 2023-04-03 DIAGNOSIS — Z79899 Other long term (current) drug therapy: Secondary | ICD-10-CM | POA: Diagnosis not present

## 2023-04-16 ENCOUNTER — Telehealth: Payer: Self-pay

## 2023-04-16 NOTE — Telephone Encounter (Signed)
 Patient LVM on nurse line reporting flu like symptoms.   I attempted to call patient back, however no answer or identifiable VM.   Will await return call to discuss symptom management.

## 2023-04-29 DIAGNOSIS — R7303 Prediabetes: Secondary | ICD-10-CM | POA: Diagnosis not present

## 2023-04-29 DIAGNOSIS — G8929 Other chronic pain: Secondary | ICD-10-CM | POA: Diagnosis not present

## 2023-04-29 DIAGNOSIS — M549 Dorsalgia, unspecified: Secondary | ICD-10-CM | POA: Diagnosis not present

## 2023-04-29 DIAGNOSIS — E785 Hyperlipidemia, unspecified: Secondary | ICD-10-CM | POA: Diagnosis not present

## 2023-04-29 DIAGNOSIS — E559 Vitamin D deficiency, unspecified: Secondary | ICD-10-CM | POA: Diagnosis not present

## 2023-04-29 DIAGNOSIS — Z79899 Other long term (current) drug therapy: Secondary | ICD-10-CM | POA: Diagnosis not present

## 2023-04-29 DIAGNOSIS — M25562 Pain in left knee: Secondary | ICD-10-CM | POA: Diagnosis not present

## 2023-04-29 DIAGNOSIS — Z9181 History of falling: Secondary | ICD-10-CM | POA: Diagnosis not present

## 2023-04-29 DIAGNOSIS — M129 Arthropathy, unspecified: Secondary | ICD-10-CM | POA: Diagnosis not present

## 2023-04-29 DIAGNOSIS — M25561 Pain in right knee: Secondary | ICD-10-CM | POA: Diagnosis not present

## 2023-05-01 DIAGNOSIS — I209 Angina pectoris, unspecified: Secondary | ICD-10-CM | POA: Diagnosis not present

## 2023-05-01 DIAGNOSIS — Z79899 Other long term (current) drug therapy: Secondary | ICD-10-CM | POA: Diagnosis not present

## 2023-05-14 ENCOUNTER — Ambulatory Visit: Admitting: Student

## 2023-05-14 ENCOUNTER — Encounter: Payer: Self-pay | Admitting: Student

## 2023-05-14 VITALS — BP 109/76 | HR 86 | Ht 70.0 in | Wt 178.2 lb

## 2023-05-14 DIAGNOSIS — R790 Abnormal level of blood mineral: Secondary | ICD-10-CM

## 2023-05-14 NOTE — Progress Notes (Signed)
    SUBJECTIVE:   CHIEF COMPLAINT / HPI: Lab follow up  Discussed the use of AI scribe software for clinical note transcription with the patient, who gave verbal consent to proceed. History of Present Illness The patient presents after a recent visit to another provider [facility]) where he was given a large capsule to take for a "low lab value". He is unsure of the exact lab value that was low, but he was told to follow up with his primary care provider. He has been taking the capsule for the past five days. He also reports taking a daily lasix which is prescribed by a cardiologist. He denies any current symptoms such as palpitations, chest pain, or shortness of breath.  PERTINENT  PMH / PSH: HTN, COPD, GERD, RA, prostate cancer s/p radioactive seed impantation, chronic opioid use/dependence  OBJECTIVE:   BP 109/76   Pulse 86   Ht 5\' 10"  (1.778 m)   Wt 178 lb 3.2 oz (80.8 kg)   SpO2 99%   BMI 25.57 kg/m   General: Well appearing, NAD, awake, alert, responsive to questions Head: Normocephalic atraumatic CV: Regular rate and rhythm  Respiratory: Clear to ausculation bilaterally, no wheezes rales or crackles, chest rises symmetrically,  no increased work of breathing Extremities: Moves upper and lower extremities freely, no edema in LE  ASSESSMENT/PLAN:   Assessment & Plan Abnormal level of blood mineral Presumed electrolyte imbalance on previous provider labs but unable to locate in system. He is unsure what tablet he is taking.  - BMP, Mag, CBC - Advise continuation of Lasix as per cardiology's recommendation - Instructed pt to call/message and provide specific lab results and what he is takin when he goes home  Levin Erp, MD [facility]

## 2023-05-14 NOTE — Patient Instructions (Addendum)
 It was great to see you! Thank you for allowing me to participate in your care!   Our plans for today:  - I am going to order labs today - Please follow up - and let me know what the labs were and what you have been taking  Take care and seek immediate care sooner if you develop any concerns.  Levin Erp, MD

## 2023-05-15 LAB — BASIC METABOLIC PANEL WITH GFR
BUN/Creatinine Ratio: 13 (ref 10–24)
BUN: 11 mg/dL (ref 8–27)
CO2: 24 mmol/L (ref 20–29)
Calcium: 9.1 mg/dL (ref 8.6–10.2)
Chloride: 102 mmol/L (ref 96–106)
Creatinine, Ser: 0.82 mg/dL (ref 0.76–1.27)
Glucose: 85 mg/dL (ref 70–99)
Potassium: 4.6 mmol/L (ref 3.5–5.2)
Sodium: 142 mmol/L (ref 134–144)
eGFR: 87 mL/min/{1.73_m2} (ref >59)

## 2023-05-15 LAB — MAGNESIUM: Magnesium: 2 mg/dL (ref 1.6–2.3)

## 2023-05-15 LAB — CBC
Hematocrit: 42 % (ref 37.5–51.0)
Hemoglobin: 13.6 g/dL (ref 13.0–17.7)
MCH: 27.7 pg (ref 26.6–33.0)
MCHC: 32.4 g/dL (ref 31.5–35.7)
MCV: 86 fL (ref 79–97)
Platelets: 238 10*3/uL (ref 150–450)
RBC: 4.91 x10E6/uL (ref 4.14–5.80)
RDW: 14.1 % (ref 11.6–15.4)
WBC: 4.2 10*3/uL (ref 3.4–10.8)

## 2023-05-16 ENCOUNTER — Telehealth: Payer: Self-pay | Admitting: Student

## 2023-05-16 NOTE — Telephone Encounter (Signed)
 Called patient and confirmed DOB. Discussed labs normal. He states he was give potassium 10 meq daily x 5 days. His K is 4.6, can stop taking this.

## 2023-05-21 ENCOUNTER — Emergency Department (HOSPITAL_COMMUNITY)

## 2023-05-21 ENCOUNTER — Other Ambulatory Visit: Payer: Self-pay

## 2023-05-21 ENCOUNTER — Observation Stay (HOSPITAL_COMMUNITY)
Admission: EM | Admit: 2023-05-21 | Discharge: 2023-05-22 | Disposition: A | Discharge status: Home / Self Care | Attending: Family Medicine | Admitting: Family Medicine

## 2023-05-21 DIAGNOSIS — Z8546 Personal history of malignant neoplasm of prostate: Secondary | ICD-10-CM | POA: Diagnosis not present

## 2023-05-21 DIAGNOSIS — Z7982 Long term (current) use of aspirin: Secondary | ICD-10-CM | POA: Insufficient documentation

## 2023-05-21 DIAGNOSIS — J9601 Acute respiratory failure with hypoxia: Secondary | ICD-10-CM | POA: Diagnosis not present

## 2023-05-21 DIAGNOSIS — J189 Pneumonia, unspecified organism: Secondary | ICD-10-CM | POA: Insufficient documentation

## 2023-05-21 DIAGNOSIS — J9811 Atelectasis: Secondary | ICD-10-CM | POA: Diagnosis not present

## 2023-05-21 DIAGNOSIS — Z789 Other specified health status: Secondary | ICD-10-CM

## 2023-05-21 DIAGNOSIS — R0902 Hypoxemia: Secondary | ICD-10-CM

## 2023-05-21 DIAGNOSIS — S2231XA Fracture of one rib, right side, initial encounter for closed fracture: Secondary | ICD-10-CM | POA: Diagnosis not present

## 2023-05-21 DIAGNOSIS — R0602 Shortness of breath: Secondary | ICD-10-CM | POA: Diagnosis not present

## 2023-05-21 DIAGNOSIS — R6889 Other general symptoms and signs: Secondary | ICD-10-CM | POA: Diagnosis not present

## 2023-05-21 DIAGNOSIS — G894 Chronic pain syndrome: Secondary | ICD-10-CM | POA: Insufficient documentation

## 2023-05-21 DIAGNOSIS — I499 Cardiac arrhythmia, unspecified: Secondary | ICD-10-CM | POA: Diagnosis not present

## 2023-05-21 DIAGNOSIS — I10 Essential (primary) hypertension: Secondary | ICD-10-CM | POA: Diagnosis not present

## 2023-05-21 DIAGNOSIS — J449 Chronic obstructive pulmonary disease, unspecified: Secondary | ICD-10-CM | POA: Insufficient documentation

## 2023-05-21 DIAGNOSIS — I700 Atherosclerosis of aorta: Secondary | ICD-10-CM | POA: Diagnosis not present

## 2023-05-21 DIAGNOSIS — Z743 Need for continuous supervision: Secondary | ICD-10-CM | POA: Diagnosis not present

## 2023-05-21 DIAGNOSIS — R000 Tachycardia, unspecified: Secondary | ICD-10-CM | POA: Diagnosis not present

## 2023-05-21 DIAGNOSIS — Z87891 Personal history of nicotine dependence: Secondary | ICD-10-CM | POA: Insufficient documentation

## 2023-05-21 DIAGNOSIS — Z79899 Other long term (current) drug therapy: Secondary | ICD-10-CM | POA: Diagnosis not present

## 2023-05-21 DIAGNOSIS — J9691 Respiratory failure, unspecified with hypoxia: Secondary | ICD-10-CM | POA: Insufficient documentation

## 2023-05-21 DIAGNOSIS — R0689 Other abnormalities of breathing: Secondary | ICD-10-CM | POA: Diagnosis not present

## 2023-05-21 DIAGNOSIS — J45909 Unspecified asthma, uncomplicated: Secondary | ICD-10-CM | POA: Diagnosis not present

## 2023-05-21 DIAGNOSIS — R918 Other nonspecific abnormal finding of lung field: Secondary | ICD-10-CM | POA: Diagnosis not present

## 2023-05-21 DIAGNOSIS — I2510 Atherosclerotic heart disease of native coronary artery without angina pectoris: Secondary | ICD-10-CM | POA: Diagnosis not present

## 2023-05-21 LAB — CBC
HCT: 42.8 % (ref 39.0–52.0)
Hemoglobin: 13.6 g/dL (ref 13.0–17.0)
MCH: 27.7 pg (ref 26.0–34.0)
MCHC: 31.8 g/dL (ref 30.0–36.0)
MCV: 87.2 fL (ref 80.0–100.0)
Platelets: 198 10*3/uL (ref 150–400)
RBC: 4.91 MIL/uL (ref 4.22–5.81)
RDW: 15 % (ref 11.5–15.5)
WBC: 8.7 10*3/uL (ref 4.0–10.5)
nRBC: 0 % (ref 0.0–0.2)

## 2023-05-21 LAB — COMPREHENSIVE METABOLIC PANEL WITH GFR
ALT: 19 U/L (ref 0–44)
AST: 20 U/L (ref 15–41)
Albumin: 3.5 g/dL (ref 3.5–5.0)
Alkaline Phosphatase: 51 U/L (ref 38–126)
Anion gap: 11 (ref 5–15)
BUN: 15 mg/dL (ref 8–23)
CO2: 24 mmol/L (ref 22–32)
Calcium: 8.9 mg/dL (ref 8.9–10.3)
Chloride: 103 mmol/L (ref 98–111)
Creatinine, Ser: 0.81 mg/dL (ref 0.61–1.24)
GFR, Estimated: >60 mL/min (ref >60)
Glucose, Bld: 132 mg/dL — ABNORMAL HIGH (ref 70–99)
Potassium: 3.2 mmol/L — ABNORMAL LOW (ref 3.5–5.1)
Sodium: 138 mmol/L (ref 135–145)
Total Bilirubin: 0.9 mg/dL (ref 0.0–1.2)
Total Protein: 6.5 g/dL (ref 6.5–8.1)

## 2023-05-21 LAB — RESP PANEL BY RT-PCR (RSV, FLU A&B, COVID)  RVPGX2
Influenza A by PCR: NEGATIVE
Influenza B by PCR: NEGATIVE
Resp Syncytial Virus by PCR: NEGATIVE
SARS Coronavirus 2 by RT PCR: NEGATIVE

## 2023-05-21 LAB — TROPONIN I (HIGH SENSITIVITY)
Troponin I (High Sensitivity): 7 ng/L (ref <18)
Troponin I (High Sensitivity): 11 ng/L (ref <18)

## 2023-05-21 LAB — BRAIN NATRIURETIC PEPTIDE: B Natriuretic Peptide: 37.7 pg/mL (ref 0.0–100.0)

## 2023-05-21 LAB — D-DIMER, QUANTITATIVE: D-Dimer, Quant: 2.41 ug{FEU}/mL — ABNORMAL HIGH (ref 0.00–0.50)

## 2023-05-21 MED ORDER — UMECLIDINIUM BROMIDE 62.5 MCG/ACT IN AEPB
1.0000 | INHALATION_SPRAY | Freq: Every day | RESPIRATORY_TRACT | Status: DC
  Administered 2023-05-21 – 2023-05-22 (×2): 1 via RESPIRATORY_TRACT
  Filled 2023-05-21: qty 7

## 2023-05-21 MED ORDER — OXYCODONE-ACETAMINOPHEN 10-325 MG PO TABS: 1.0000 | ORAL_TABLET | ORAL | Status: DC | PRN

## 2023-05-21 MED ORDER — ASPIRIN 81 MG PO TBEC
81.0000 mg | DELAYED_RELEASE_TABLET | Freq: Every morning | ORAL | Status: DC
  Administered 2023-05-21 – 2023-05-22 (×2): 81 mg via ORAL
  Filled 2023-05-21 (×2): qty 1

## 2023-05-21 MED ORDER — IPRATROPIUM-ALBUTEROL 0.5-2.5 (3) MG/3ML IN SOLN
3.0000 mL | Freq: Once | RESPIRATORY_TRACT | Status: AC
  Administered 2023-05-21: 3 mL via RESPIRATORY_TRACT
  Filled 2023-05-21: qty 3

## 2023-05-21 MED ORDER — SODIUM CHLORIDE 0.9 % IV SOLN
1.0000 g | Freq: Once | INTRAVENOUS | Status: AC
  Administered 2023-05-21: 1 g via INTRAVENOUS
  Filled 2023-05-21: qty 10

## 2023-05-21 MED ORDER — PREDNISONE 20 MG PO TABS
40.0000 mg | ORAL_TABLET | Freq: Every day | ORAL | Status: DC
  Administered 2023-05-22: 40 mg via ORAL
  Filled 2023-05-21: qty 2

## 2023-05-21 MED ORDER — ISOSORBIDE MONONITRATE ER 30 MG PO TB24
30.0000 mg | ORAL_TABLET | Freq: Every day | ORAL | Status: DC
  Administered 2023-05-21 – 2023-05-22 (×2): 30 mg via ORAL
  Filled 2023-05-21 (×2): qty 1

## 2023-05-21 MED ORDER — ATORVASTATIN CALCIUM 10 MG PO TABS
20.0000 mg | ORAL_TABLET | Freq: Every day | ORAL | Status: DC
  Administered 2023-05-21 – 2023-05-22 (×2): 20 mg via ORAL
  Filled 2023-05-21 (×2): qty 2

## 2023-05-21 MED ORDER — POTASSIUM CHLORIDE 20 MEQ PO PACK
40.0000 meq | PACK | Freq: Once | ORAL | Status: AC
  Administered 2023-05-21: 40 meq via ORAL
  Filled 2023-05-21: qty 2

## 2023-05-21 MED ORDER — PANTOPRAZOLE SODIUM 40 MG PO TBEC
80.0000 mg | DELAYED_RELEASE_TABLET | Freq: Every day | ORAL | Status: DC
  Administered 2023-05-21 – 2023-05-22 (×2): 80 mg via ORAL
  Filled 2023-05-21 (×2): qty 2

## 2023-05-21 MED ORDER — ALBUTEROL SULFATE (2.5 MG/3ML) 0.083% IN NEBU: 3.0000 mL | INHALATION_SOLUTION | RESPIRATORY_TRACT | Status: DC | PRN

## 2023-05-21 MED ORDER — POLYETHYLENE GLYCOL 3350 17 G PO PACK: 17.0000 g | PACK | Freq: Every day | ORAL | Status: DC | PRN

## 2023-05-21 MED ORDER — IOHEXOL 350 MG/ML SOLN
75.0000 mL | Freq: Once | INTRAVENOUS | Status: AC | PRN
  Administered 2023-05-21: 75 mL via INTRAVENOUS

## 2023-05-21 MED ORDER — FUROSEMIDE 20 MG PO TABS: 40.0000 mg | ORAL_TABLET | Freq: Every day | ORAL | Status: DC

## 2023-05-21 MED ORDER — METHYLPREDNISOLONE SODIUM SUCC 125 MG IJ SOLR
125.0000 mg | Freq: Once | INTRAMUSCULAR | Status: AC
  Administered 2023-05-21: 125 mg via INTRAVENOUS
  Filled 2023-05-21: qty 2

## 2023-05-21 MED ORDER — OXYCODONE-ACETAMINOPHEN 5-325 MG PO TABS
1.0000 | ORAL_TABLET | ORAL | Status: DC | PRN
  Administered 2023-05-22: 1 via ORAL
  Filled 2023-05-21: qty 1

## 2023-05-21 MED ORDER — OXYCODONE HCL 5 MG PO TABS: 5.0000 mg | ORAL_TABLET | ORAL | Status: DC | PRN

## 2023-05-21 MED ORDER — ENOXAPARIN SODIUM 40 MG/0.4ML IJ SOSY
40.0000 mg | PREFILLED_SYRINGE | INTRAMUSCULAR | Status: DC
  Administered 2023-05-21: 40 mg via SUBCUTANEOUS
  Filled 2023-05-21: qty 0.4

## 2023-05-21 MED ORDER — SODIUM CHLORIDE 0.9 % IV SOLN
500.0000 mg | Freq: Once | INTRAVENOUS | Status: AC
  Administered 2023-05-21: 500 mg via INTRAVENOUS
  Filled 2023-05-21: qty 5

## 2023-05-21 NOTE — Assessment & Plan Note (Signed)
 CXR, chest CTA, lung exam, and previous hypoxia concerning for right sided multifocal pneumonia.  He is s/p IV ceftriaxone and azithromycin in the ED -Re dose abx tomorrow, may be able to give p.o.  -AM CBC and BMP

## 2023-05-21 NOTE — ED Provider Notes (Signed)
 [facility]-EMERGENCY DEPT [facility] Emergency Department Provider Note MRN:  [id number]  Arrival date & time: 05/21/23     Chief Complaint   Shortness of Breath   History of Present Illness   Joel Donaldson is a 83 y.o. year-old male with a history of COPD presenting to the ED with chief complaint of shortness of breath.  Progressively worsening shortness of breath over the past 2 days.  Reportedly in the 80s on room air on EMS arrival.  Denies chest pain.  Says he ran out of some medicines 2 days ago and thinks that is related.  Review of Systems  A thorough review of systems was obtained and all systems are negative except as noted in the HPI and PMH.   Patient's Health History    Past Medical History:  Diagnosis Date   Acute nonintractable headache 05/31/2021   Alcohol abuse    Anxiety    Arthritis    hands, back, knees   Asthma    Atypical angina (HCC) (Class III) 12/19/2011   Baker's cyst of knee, right 06/21/2016   Chest pain at rest 10/04/2016   Depression    Epigastric pain 05/20/2020   Fall 10/23/2021   GERD (gastroesophageal reflux disease)    Glaucoma    Heart murmur asymptomatic   Hx of radioisotope therapy 07/26/2011   Radiosactive Prostate Seed Implant/ I-125 Seeds   Hypertension    Neck pain 10/22/2019   Prostate cancer (HCC) dx'd 2016 or 2017   seed implants   Rheumatoid arthritis (HCC) 10/10/2012    Past Surgical History:  Procedure Laterality Date   CYSTOSCOPY  09/14/2011   Procedure: CYSTOSCOPY FLEXIBLE;  Surgeon: Lindaann Slough, MD;  Location: [facility];  Service: Urology;  Laterality: N/A;  No seeds seen in the bladder   LEFT HEART CATH AND CORONARY ANGIOGRAPHY N/A 09/16/2020   Procedure: LEFT HEART CATH AND CORONARY ANGIOGRAPHY;  Surgeon: Marykay Lex, MD;  Location: [facility] INVASIVE CV LAB;  Service: Cardiovascular;  Laterality: N/A;   LUMBAR FUSION  1995   RADIOACTIVE SEED IMPLANT  09/14/2011   Procedure: RADIOACTIVE SEED  IMPLANT;  Surgeon: Lindaann Slough, MD;  Location: [facility];  Service: Urology;  Laterality: N/A;  Total number of seeds -     SHOULDER SURGERY  YRS AGO   LEFT    Family History  Problem Relation Age of Onset   Diabetes Mother    Heart disease Father        lived to 36 - had bypass, pacemaker    Social History   Socioeconomic History   Marital status: Legally Separated    Spouse name: Pattricia Boss   Number of children: 1   Years of education: 11   Highest education level: Not on file  Occupational History   Occupation: Retired    Associate Professor: RETIRED  Tobacco Use   Smoking status: Former    Current packs/day: 0.00    Average packs/day: 2.0 packs/day for 51.7 years (103.3 ttl pk-yrs)    Types: Cigarettes    Start date: 02/12/1953    Quit date: 10/13/2004    Years since quitting: 18.6   Smokeless tobacco: Never   Tobacco comments:    Smoked 2 ppd  Vaping Use   Vaping status: Never Used  Substance and Sexual Activity   Alcohol use: Not Currently    Alcohol/week: 14.0 standard drinks of alcohol    Types: 14 Shots of liquor per week    Comment: 1/2 pint  of liquor daily   Drug use: No   Sexual activity: Not Currently  Other Topics Concern   Not on file  Social History Narrative   Patient lives with his granddaughter in [place].   Patient is currently legally separated.    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    Patient enjoys seeing friends, hunting, and fishing.    Receives aide services 7 days week          Social Drivers of [employer]: Low Risk  (08/13/2022)   Overall Financial Resource Strain (CARDIA)    Difficulty of Paying Living Expenses: Not hard at all  Food Insecurity: No Food Insecurity (08/28/2022)   Hunger Vital Sign    Worried About Running Out of Food in the Last Year: Never true    Ran Out of Food in the Last Year: Never true  Transportation Needs: No Transportation Needs (08/28/2022)    PRAPARE - Administrator, Civil Service (Medical): No    Lack of Transportation (Non-Medical): No  Physical Activity: Insufficiently Active (08/13/2022)   Exercise Vital Sign    Days of Exercise per Week: 3 days    Minutes of Exercise per Session: 30 min  Stress: No Stress Concern Present (08/13/2022)   [employer] of Occupational Health - Occupational Stress Questionnaire    Feeling of Stress : Not at all  Social Connections: Moderately Isolated (08/13/2022)   Social Connection and Isolation Panel [NHANES]    Frequency of Communication with Friends and Family: More than three times a week    Frequency of Social Gatherings with Friends and Family: Three times a week    Attends Religious Services: More than 4 times per year    Active Member of Clubs or Organizations: No    Attends Banker Meetings: Never    Marital Status: Separated  Intimate Partner Violence: Not At Risk (08/28/2022)   Humiliation, Afraid, Rape, and Kick questionnaire    Fear of Current or Ex-Partner: No    Emotionally Abused: No    Physically Abused: No    Sexually Abused: No     Physical Exam   Vitals:   05/21/23 0622 05/21/23 0628  BP: (!) 140/63   Pulse: (!) 121   Resp: 18   Temp: 99.6 F (37.6 C)   SpO2: 97% 97%    CONSTITUTIONAL: Well-appearing, NAD NEURO/PSYCH:  Alert and oriented x 3, no focal deficits EYES:  eyes equal and reactive ENT/NECK:  no LAD, no JVD CARDIO: Regular rate, well-perfused, normal S1 and S2 PULM: Tachypnea, clear lungs GI/GU:  non-distended, non-tender MSK/SPINE:  No gross deformities, no edema SKIN:  no rash, atraumatic   *Additional and/or pertinent findings included in MDM below  Diagnostic and Interventional Summary    EKG Interpretation Date/Time:  Tuesday May 21 2023 06:26:39 EDT Ventricular Rate:  122 PR Interval:    QRS Duration:  92 QT Interval:  329 QTC Calculation: 469 R Axis:   -20  Text Interpretation: Sinus rhythm  ventricular premature complex Borderline left axis deviation Confirmed by Kennis Carina [phone]) on 05/21/2023 7:01:01 AM       Labs Reviewed  CBC  COMPREHENSIVE METABOLIC PANEL WITH GFR  BRAIN NATRIURETIC PEPTIDE  D-DIMER, QUANTITATIVE  TROPONIN I (HIGH SENSITIVITY)    DG Chest Port 1 View    (Results Pending)    Medications  ipratropium-albuterol (DUONEB) 0.5-2.5 (3) MG/3ML nebulizer solution 3 mL (3 mLs Nebulization Given 05/21/23 0640)  methylPREDNISolone sodium succinate (SOLU-MEDROL) 125 mg/2 mL injection 125 mg (125 mg Intravenous Given 05/21/23 0640)     Procedures  /  Critical Care .Critical Care  Performed by: Sabas Sous, MD Authorized by: Sabas Sous, MD   Critical care provider statement:    Critical care time (minutes):  30   Critical care was necessary to treat or prevent imminent or life-threatening deterioration of the following conditions:  Respiratory failure   Critical care was time spent personally by me on the following activities:  Development of treatment plan with patient or surrogate, discussions with consultants, evaluation of patient's response to treatment, examination of patient, ordering and review of laboratory studies, ordering and review of radiographic studies, ordering and performing treatments and interventions, pulse oximetry, re-evaluation of patient's condition and review of old charts   ED Course and Medical Decision Making  Initial Impression and Ddx Differential diagnosis includes COPD exacerbation, new onset CHF, PE also considered.  Lungs are largely clear with good air movement.  Past medical/surgical history that increases complexity of ED encounter: COPD  Interpretation of Diagnostics I personally reviewed the EKG and my interpretation is as follows: Sinus rhythm  Labs and x-ray pending  Patient Reassessment and Ultimate Disposition/Management     Signed out to oncoming provider at shift change.  New oxygen requirement,  anticipating admission.  Patient management required discussion with the following services or consulting groups:  None  Complexity of Problems Addressed Acute illness or injury that poses threat of life of bodily function  Additional Data Reviewed and Analyzed Further history obtained from: EMS on arrival  Additional Factors Impacting ED Encounter Risk Consideration of hospitalization  Elmer Sow. Pilar Plate, MD [facility] Emergency Medicine [facility] [email or web address]  Final Clinical Impressions(s) / ED Diagnoses     ICD-10-CM   1. Shortness of breath  R06.02     2. Hypoxia  R09.02       ED Discharge Orders     None        Discharge Instructions Discussed with and Provided to Patient:   Discharge Instructions   None      Sabas Sous, MD 05/21/23 [phone]

## 2023-05-21 NOTE — ED Provider Notes (Signed)
 83 year old male, accepted at change of shift, x-ray showed possible infiltrates, D-dimer elevated, hypoxic and tachycardic, x-ray shows infiltrates consistent with pneumonia, CT angiogram shows no pulmonary embolism, just pneumonia.  Strangely the patient does not have a leukocytosis but does have a borderline temperature.  Will add COVID panel, antibiotics and admit.  The patient has a existing medical relationship with the family practice, they will be consulted for admission   Eber Hong, MD 05/21/23 1012

## 2023-05-21 NOTE — ED Triage Notes (Signed)
 Patient brought in by EMS coming from home with increase SOB x12 hours. 1 day ago he started having productive cough. Hx. COPD. Initial RA EMS got was 80% NRB 97% 15L.

## 2023-05-21 NOTE — Assessment & Plan Note (Signed)
 Patient denies increased cough or sputum production to me although RN note states he started having productive cough a day ago. No cough during admission.  Will treat with steroids and antibiotics for possible COPD exacerbation.  He is s/p Solu-Medrol and azithromycin in the ED. -Prednisone 40 mg starting tomorrow to complete 5 days of steroids -Redose azithromycin tomorrow  -Home Incruse Ellipta daily -Albuterol 2 puffs every 4 hours as needed

## 2023-05-21 NOTE — H&P (Addendum)
 Hospital Admission History and Physical Service Pager: [phone]  Patient name: Joel Donaldson Medical record number: [id number] Date of Birth: 07-12-1940 Age: 83 y.o. Gender: male  Primary Care Provider: Levin Erp, MD Consultants: None Code Status: DNR, DNI Preferred Emergency Contact:  Contact Information     Name Relation Home Work [place] Niece   [phone]      Other Contacts     Name Relation Home Work Mobile   [place]el[place] Spouse [phone]   Hood,Gloria Neighbor   [phone]        Chief Complaint: Shortness of breath  Assessment and Plan: Joel Donaldson is a 83 y.o. male presenting with shortness of breath and acute hypoxic respiratory failure which is now resolved. Differential for this patient's presentation of this includes pneumonia given findings on CXR and chest CTA, COPD exacerbation though patient denies increased cough or increased sputum production.  Also considered heart failure given patient had missed a couple doses of his Lasix however he does not appear to be volume overloaded and does not have a diagnosis of HFrEF - last echo in August showing EF 55% and G1DD and BNP of 37 today. Can consider arrhythmia though none seen on EKG or telemetry thus far however patient and his friend note that EMS commented on an abnormal heart rate or rhythm.  Troponin and previous EKG nonconcerning for MI. PE r/o with negative CT PE Assessment & Plan Hypoxic respiratory failure (HCC) Desatted to 80s per EMS, improved on 2 L.  Now breathing comfortably on room air with SpO2 in mid 90s.   -Admit to FMTS, attending Dr. Pollie Meyer -Continuous pulse ox, goal SpO2 88-92% given COPD -Continuous cardiac monitoring to r/o arrhythmia as cause of hypoxia, fast heart rate and lightheadedness  -Treat as below - PT eval and treat given weakness  -Recommend ambulatory pulse ox prior to discharge Pneumonia CXR, chest CTA, lung  exam, and previous hypoxia concerning for right sided multifocal pneumonia.  He is s/p IV ceftriaxone and azithromycin in the ED -Re dose abx tomorrow, may be able to give p.o.  -AM CBC and BMP  COPD (chronic obstructive pulmonary disease) (HCC) Patient denies increased cough or sputum production to me although RN note states he started having productive cough a day ago. No cough during admission.  Will treat with steroids and antibiotics for possible COPD exacerbation.  He is s/p Solu-Medrol and azithromycin in the ED. -Prednisone 40 mg starting tomorrow to complete 5 days of steroids -Redose azithromycin tomorrow  -Home Incruse Ellipta daily -Albuterol 2 puffs every 4 hours as needed   Chronic and Stable Conditions: CAD-follows with cardiology, last saw 12/28/2022.  Continue Imdur, aspirin, and atorvastatin HLD-continue atorvastatin CAD-continue aspirin GERD-continue omeprazole (formulary alternative) Chronic pain-continue home Percocet.  Also uses buprenorphine patch at home however does not have this on currently **Patient takes Lasix 40 mg daily.  Cardiology note from October states low concern for heart failure and that patient can take Lasix as needed.  Lasix not ordered.   FEN/GI: Heart healthy VTE Prophylaxis: Lovenox  Disposition: Med tele  History of Present Illness:  Joel Donaldson is a 83 y.o. male presenting with SOB  Pt notes he felt SOB around 5 am this morning, also felt like heart was racing and a little light headed with mild chest pain. All of these symptoms are now resolved but he does feel a little weak.   He denies congestion, increase in cough, sputum  production, fever or body aches. No recent change in po intake. Yesterday he felt like his normal self.  He ran out of his lasix and imdur about 2 days ago. Per patient and his friend in the room, EMS stated his heart was beating fast and abnormal although I cannot find documentation of this.  In the ED, vitals  notable for tachycardia to low 120, tachypnea to 30s.  Per EMS, was hypoxia to 80s requiring 2 L nasal cannula.  Vitals otherwise stable. Labs showing no leukocytosis, mild hypokalemia to 3.2, troponin 7>11, D-dimer elevated to 2.41 and respiratory panel pending. CXR showing diffuse interstitial opacities and consolidations of right lung concerning for multifocal pneumonia Chest CTA PE with no evidence of PE and consistent with CXR showing consolidations in right upper and lower lobes.  Also showing left base atelectasis, CAD and aortic atherosclerosis. He was treated with DuoNebs, Solu-Medrol and started on azithromycin and ceftriaxone   Review Of Systems: Per HPI   Pertinent Past Medical History:  COPD HTN RA Prostate cancer s/p radioactive seed implantation years ago HLD remainder reviewed in history tab.  Chronic pain  Pertinent Past Surgical History: Left heart cath and coronary angio 2022 Lumbar fusion 1995 Radioactive seed implant for prostate cancer in 2013 Remainder reviewed in history tab.   Pertinent Social History: Tobacco use: former Alcohol use: Wine on occasion Other Substance use: No Lives with grand daughter  Pertinent Family History: Mother-deceased, diabetes Father-deceased, heart disease  Remainder reviewed in history tab.   Important Outpatient Medications: Aspirin 81 mg daily Buprenorphine 7.5 mcg every Thursday - does not have it on currently but would like it on Percocet 10-325 mg every 4 hours as needed Atorvastatin 20 mg daily Lasix 40 mg daily Imdur 30 mg daily Nitrostat 0.4 mg sublingual as needed Prilosec 40 mg daily MiraLAX as needed Multivitamin Albuterol Incruse Ellipta daily  Remainder reviewed in medication history.   Objective: BP 139/69   Pulse (!) 50   Temp 98.8 F (37.1 C) (Oral)   Resp 15   Ht 5\' 10"  (1.778 m)   Wt 80.7 kg   SpO2 94%   BMI 25.54 kg/m  Exam: General: 83 year old male, sitting up in bed, NAD Eyes:  White sclera, clear conjunctiva ENTM: MMM Neck: Supple Cardiovascular: Mildly tachycardic, regular rhythm, no murmur, no JVD Respiratory: Breathing comfortably on room air, diminished sounds at right lung base, crackles at R upper lobe, very mild expiratory wheeze throughout Gastrointestinal: Bowel sounds present, soft, nontender patient, nondistended MSK/extremities: Normal bulk and tone, no edema BLEs Derm: Warm and dry Neuro: Alert, no focal deficits Psych: Mood and affect appropriate for situation  Labs:  CBC BMET  Recent Labs  Lab 05/21/23 0654  WBC 8.7  HGB 13.6  HCT 42.8  PLT 198   Recent Labs  Lab 05/21/23 0654  NA 138  K 3.2*  CL 103  CO2 24  BUN 15  CREATININE 0.81  GLUCOSE 132*  CALCIUM 8.9     EKG: My own interpretation (not copied from electronic read)  Sinus tachycardia   Imaging Studies Performed:  Imaging Study (ie. Chest x-ray) Impression from Radiologist: Diffuse interstitial opacities throughout right lung with mild consolidative change in the periphery of right midlung.  Findings concerning for multifocal pneumonia.  Atelectasis versus consolidation within right base.  My Interpretation: Scattered opacities and consolidations of right lung, worse at the base   Erick Alley, DO 05/21/2023, 10:45 AM PGY-3, [facility]  FPTS Intern pager:  [phone], text pages welcome Secure chat group [facility] Teaching Service

## 2023-05-21 NOTE — ED Notes (Signed)
 Patient transported to CT

## 2023-05-21 NOTE — Progress Notes (Signed)
   05/21/23 2007  Vitals  Temp 99.3 F (37.4 C)  Temp Source Oral  BP 117/77  MAP (mmHg) 89  BP Location Right Arm  BP Method Automatic  Patient Position (if appropriate) Lying  Pulse Rate 89  Resp 19  MEWS COLOR  MEWS Score Color Green  Oxygen Therapy  SpO2 97 %  O2 Device Room Air  MEWS Score  MEWS Temp 0  MEWS Systolic 0  MEWS Pulse 0  MEWS RR 0  MEWS LOC 0  MEWS Score 0   Pt is A&O x 4. VSS, on room air. Pt asymptomatic. Safety maintained. Will continue to monitor.

## 2023-05-21 NOTE — Assessment & Plan Note (Signed)
 Desatted to 80s per EMS, improved on 2 L.  Now breathing comfortably on room air with SpO2 in mid 90s.   -Admit to FMTS, attending Dr. Pollie Meyer -Continuous pulse ox, goal SpO2 88-92% given COPD -Continuous cardiac monitoring to r/o arrhythmia as cause of hypoxia, fast heart rate and lightheadedness  -Treat as below - PT eval and treat given weakness  -Recommend ambulatory pulse ox prior to discharge

## 2023-05-21 NOTE — Plan of Care (Signed)
 FMTS Brief Progress Note  S: Reports improvement with breathing.  Denies any shortness of breath, chest pain or dyspnea.  He reports intermittent cough with clear sputum production. Denies fevers, chills, nausea or emesis.  Reports that his granddaughter stays with him.  Anticipates possible discharge tomorrow if clinical symptoms continue to improve. Notes improvements with steroids.   O: BP 117/77 (BP Location: Right Arm)   Pulse 89   Temp 99.3 F (37.4 C) (Oral)   Resp 19   Ht 5\' 10"  (1.778 m)   Wt 81.2 kg   SpO2 97%   BMI 25.68 kg/m   General: Alert,no acute distress, sitting up in bed, non-ill appearing  CV: RRR, no murmurs appreciated Pulm: Normal work of breathing, CTAB in left lung fields, crackles RUL and diminished breathe sounds on RLL   A/P: Plan per day team, refer to HP for full plan  - Satting well on room air - PT/OT eval in AM   - Anticipate discharge tomorrow  - Consider PO antibiotics at discharge given CXR and diminished lung sounds on the exam  - Orders reviewed. Labs for AM ordered, which was adjusted as needed.  - If condition changes, plan includes considering continuing anti-biotic course.   Peterson Ao, MD 05/21/2023, 10:19 PM PGY-1, [facility] Night Resident  Please page [phone] with questions.

## 2023-05-22 ENCOUNTER — Other Ambulatory Visit (HOSPITAL_COMMUNITY): Payer: Self-pay

## 2023-05-22 DIAGNOSIS — Z789 Other specified health status: Secondary | ICD-10-CM

## 2023-05-22 DIAGNOSIS — J9601 Acute respiratory failure with hypoxia: Secondary | ICD-10-CM | POA: Diagnosis not present

## 2023-05-22 LAB — BASIC METABOLIC PANEL WITH GFR
Anion gap: 8 (ref 5–15)
BUN: 15 mg/dL (ref 8–23)
CO2: 25 mmol/L (ref 22–32)
Calcium: 8.6 mg/dL — ABNORMAL LOW (ref 8.9–10.3)
Chloride: 104 mmol/L (ref 98–111)
Creatinine, Ser: 0.7 mg/dL (ref 0.61–1.24)
GFR, Estimated: >60 mL/min (ref >60)
Glucose, Bld: 103 mg/dL — ABNORMAL HIGH (ref 70–99)
Potassium: 4 mmol/L (ref 3.5–5.1)
Sodium: 137 mmol/L (ref 135–145)

## 2023-05-22 LAB — CBC
HCT: 35.8 % — ABNORMAL LOW (ref 39.0–52.0)
Hemoglobin: 11.7 g/dL — ABNORMAL LOW (ref 13.0–17.0)
MCH: 27.9 pg (ref 26.0–34.0)
MCHC: 32.7 g/dL (ref 30.0–36.0)
MCV: 85.4 fL (ref 80.0–100.0)
Platelets: 175 10*3/uL (ref 150–400)
RBC: 4.19 MIL/uL — ABNORMAL LOW (ref 4.22–5.81)
RDW: 15.1 % (ref 11.5–15.5)
WBC: 12.4 10*3/uL — ABNORMAL HIGH (ref 4.0–10.5)
nRBC: 0 % (ref 0.0–0.2)

## 2023-05-22 LAB — GLUCOSE, CAPILLARY: Glucose-Capillary: 111 mg/dL — ABNORMAL HIGH (ref 70–99)

## 2023-05-22 MED ORDER — PREDNISONE 20 MG PO TABS
40.0000 mg | ORAL_TABLET | Freq: Every day | ORAL | 0 refills | Status: AC
  Filled 2023-05-22: qty 6, 3d supply, fill #0

## 2023-05-22 MED ORDER — AMOXICILLIN 875 MG PO TABS
875.0000 mg | ORAL_TABLET | Freq: Two times a day (BID) | ORAL | 0 refills | Status: AC
  Filled 2023-05-22: qty 8, 4d supply, fill #0

## 2023-05-22 MED ORDER — AZITHROMYCIN 500 MG PO TABS
500.0000 mg | ORAL_TABLET | Freq: Every day | ORAL | 0 refills | Status: AC
  Filled 2023-05-22: qty 2, 2d supply, fill #0

## 2023-05-22 MED ORDER — FUROSEMIDE 40 MG PO TABS
40.0000 mg | ORAL_TABLET | Freq: Every day | ORAL | 3 refills | Status: AC | PRN
  Filled 2023-05-22: qty 90, 90d supply, fill #0

## 2023-05-22 NOTE — Assessment & Plan Note (Deleted)
 Satting well on room air now.   -Continuous pulse ox, goal SpO2 88-92% given COPD -Continuous cardiac monitoring to r/o arrhythmia as cause of hypoxia, fast heart rate and lightheadedness  -Treat as below - PT eval and treat given weakness (if walks okay w pulse ox don't need it)*** -Recommend ambulatory pulse ox prior to discharge***

## 2023-05-22 NOTE — Assessment & Plan Note (Deleted)
 Imaging consistent with multifocal pneumonia.  Got IV ceftriaxone and azithromycin in the ED.  White cell count increased to 12.4 this morning, due to steroid. -Transition to oral antibiotics, p.o. azithromycin 500mg  daily x2 days and amox  875mg  BID x5 days total

## 2023-05-22 NOTE — Discharge Instructions (Addendum)
 Dear Ellin Saba,  Thank you for letting us participate in your care. You were hospitalized for difficulty breathing and diagnosed with a COPD exacerbation and possible pneumonia. You were treated with antibiotics and inhalers to help you breath.   POST-HOSPITAL & CARE INSTRUCTIONS Please make sure to finish your antibiotics and steroids. If you have worsening difficulty breathing, please return our clinic or ED. Go to your follow up appointments (listed below)   DOCTOR'S APPOINTMENT   Future Appointments  Date Time Provider Department Center  05/28/2023  2:50 PM Levin Erp, MD [facility]  08/19/2023  8:30 AM [facility] ANNUAL WELLNESS VISIT [facility]     Take care and be well!  Family Medicine Teaching Service Inpatient Team [place]  [facility]  [address] [place], [place] [address] [phone]

## 2023-05-22 NOTE — Care Management Obs Status (Signed)
 MEDICARE OBSERVATION STATUS NOTIFICATION   Patient Details  Name: Joel Donaldson MRN: [id number] Date of Birth: 03-13-1940   Medicare Observation Status Notification Given:  Yes    Lawerance Sabal, RN 05/22/2023, 10:15 AM

## 2023-05-22 NOTE — Plan of Care (Signed)
 Patient is A&O x 4, VSS, on room air. Sinus rhythm on the cardiac monitor. Pt asymptomatic. Safety maintained. Will continue to monitor.    Problem: Education: Goal: Knowledge of General Education information will improve Description: Including pain rating scale, medication(s)/side effects and non-pharmacologic comfort measures Outcome: Progressing   Problem: Health Behavior/Discharge Planning: Goal: Ability to manage health-related needs will improve Outcome: Progressing   Problem: Clinical Measurements: Goal: Ability to maintain clinical measurements within normal limits will improve Outcome: Progressing Goal: Will remain free from infection Outcome: Progressing Goal: Diagnostic test results will improve Outcome: Progressing Goal: Respiratory complications will improve Outcome: Progressing Goal: Cardiovascular complication will be avoided Outcome: Progressing   Problem: Activity: Goal: Risk for activity intolerance will decrease Outcome: Progressing   Problem: Nutrition: Goal: Adequate nutrition will be maintained Outcome: Progressing   Problem: Coping: Goal: Level of anxiety will decrease Outcome: Progressing   Problem: Elimination: Goal: Will not experience complications related to bowel motility Outcome: Progressing Goal: Will not experience complications related to urinary retention Outcome: Progressing   Problem: Pain Managment: Goal: General experience of comfort will improve and/or be controlled Outcome: Progressing   Problem: Safety: Goal: Ability to remain free from injury will improve Outcome: Progressing   Problem: Skin Integrity: Goal: Risk for impaired skin integrity will decrease Outcome: Progressing

## 2023-05-22 NOTE — Assessment & Plan Note (Deleted)
 Got Solu-Medrol and azithromycin in the ED.   -40 mg prednisone x5 days total -Antibiotics as above -Home Incruse Ellipta daily -consider starting additional inhaler  -Albuterol 2 puffs every 4 hours as needed

## 2023-05-22 NOTE — Discharge Summary (Addendum)
 Family Medicine Teaching [facility] Discharge Summary  Patient name: Joel Donaldson Medical record number: [id number] Date of birth: 1940-10-27 Age: 83 y.o. Gender: male Date of Admission: 05/21/2023  Date of Discharge: 05/22/23 Admitting Physician: Latrelle Dodrill, MD  Primary Care Provider: Levin Erp, MD Consultants: none  Indication for Hospitalization: COPD exacerbation  Discharge Diagnoses/Problem List:  Principal Problem for Admission: COPD exacerbation Other Problems addressed during stay:  Principal Problem:   Acute hypoxic respiratory failure (HCC) Active Problems:   COPD (chronic obstructive pulmonary disease) (HCC)   Hypoxic respiratory failure (HCC)   Pneumonia   Chronic health problem  Brief Hospital Course:  Joel Donaldson is a 83 y.o.male with a history of COPD, HTN, RA, prostate cancer and HLD who was admitted to the family medicine teaching Service at [facility] for dyspnea. His hospital course is detailed below:  Acute hypoxic respiratory failure Pneumonia COPD Acute onset dyspnea, found to be hypoxic requiring 2 L [place].  CXR, CT chest and physical exam consistent with right lung PNA.  Received ceftriaxone and azithromycin initially and was transitioned to oral azithromycin and amoxicillin quickly due to swift improvement. Clinically did not meet criteria for COPD exacerbation but treated with prednisone 40 mg x 5 days for symptomatic relief.  Other chronic conditions were medically managed with home medications and formulary alternatives as necessary (CAD, HLD, GERD, chronic pain)  PCP Follow-up Recommendations: Clarify opioid use regimen with both buprenoprhine and percocet Clarify Lasix use- cards note says use PRN for leg swelling but pt reports taking it daily Consider adjusting COPD inhalers  Disposition: home  Discharge Condition: stable  Discharge Exam:  Vitals:   05/22/23 0743 05/22/23 1125  BP: (!) 149/96   Pulse: 82 (!) 109   Resp: 17   Temp: 97.8 F (36.6 C)   SpO2: 100% 96%   Gen: sitting up in bed, pleasant and conversant, in NAD CV: RRR, normal S1/S2, no m/r/g Pulm: normal WOB on RA, mild expiratory wheeze throughout Ext: no edema to BLE  Significant Procedures: none  Significant Labs and Imaging:  Recent Labs  Lab 05/21/23 0654 05/22/23 0620  WBC 8.7 12.4*  HGB 13.6 11.7*  HCT 42.8 35.8*  PLT 198 175   Recent Labs  Lab 05/21/23 0654 05/22/23 0620  NA 138 137  K 3.2* 4.0  CL 103 104  CO2 24 25  GLUCOSE 132* 103*  BUN 15 15  CREATININE 0.81 0.70  CALCIUM 8.9 8.6*  ALKPHOS 51  --   AST 20  --   ALT 19  --   ALBUMIN 3.5  --    CTA chest: dense RUL and RLL consolidation consistent with pneumonia  CXR:  1. Diffuse interstitial opacities throughout the right lung with mild consolidative change within the periphery of the right midlung. Findings are concerning for multifocal pneumonia. 2. Atelectasis versus consolidation within the right base.  Results/Tests Pending at Time of Discharge: none  Discharge Medications:  Allergies as of 05/22/2023       Reactions   Tramadol Nausea Only   Pollen Extract Other (See Comments)   Sneezing        Medication List     TAKE these medications    acetaminophen 500 MG tablet Commonly known as: TYLENOL Take 2 tablets (1,000 mg total) by mouth every 6 (six) hours as needed. What changed:  when to take this reasons to take this   albuterol 108 (90 Base) MCG/ACT inhaler Commonly known as: VENTOLIN HFA INHALE  1 TO 2 PUFFS INTO THE LUNGS EVERY 4 HOURS AS NEEDED FOR WHEEZING OR SHORTNESS OF BREATH   amoxicillin 875 MG tablet Commonly known as: AMOXIL Take 1 tablet (875 mg total) by mouth 2 (two) times daily for 4 days.   aspirin EC 81 MG tablet Take 81 mg by mouth every morning.   atorvastatin 20 MG tablet Commonly known as: LIPITOR TAKE 1 TABLET(20 MG) BY MOUTH DAILY   azithromycin 500 MG tablet Commonly known as:  Zithromax Take 1 tablet (500 mg total) by mouth daily for 2 days.   buprenorphine 10 MCG/HR Ptwk Commonly known as: BUTRANS Place 1 patch onto the skin every Thursday.   diclofenac Sodium 1 % Gel Commonly known as: VOLTAREN Apply 4 g topically 4 (four) times daily. What changed:  when to take this reasons to take this   furosemide 40 MG tablet Commonly known as: LASIX Take 1 tablet (40 mg total) by mouth daily as needed for edema. What changed:  when to take this reasons to take this   Incruse Ellipta 62.5 MCG/ACT Aepb Generic drug: umeclidinium bromide INHALE 1 PUFF INTO THE LUNGS DAILY   isosorbide mononitrate 30 MG 24 hr tablet Commonly known as: IMDUR Take 1 tablet (30 mg total) by mouth daily.   lidocaine 5 % ointment Commonly known as: XYLOCAINE Apply 1 application topically daily as needed for mild pain or moderate pain.   nitroGLYCERIN 0.4 MG SL tablet Commonly known as: NITROSTAT Place 1 tablet (0.4 mg total) under the tongue every 5 (five) minutes as needed for chest pain (chest pain).   omeprazole 40 MG capsule Commonly known as: PRILOSEC TAKE 1 CAPSULE(40 MG) BY MOUTH DAILY BEFORE BREAKFAST   One-A-Day Proactive 65+ Tabs Take 1 tablet by mouth daily with breakfast.   oxyCODONE-acetaminophen 10-325 MG tablet Commonly known as: PERCOCET Take 1 tablet by mouth 4 (four) times daily as needed for pain.   polyethylene glycol 17 g packet Commonly known as: MIRALAX / GLYCOLAX Take 17 g by mouth daily as needed for mild constipation.   predniSONE 20 MG tablet Commonly known as: DELTASONE Take 2 tablets (40 mg total) by mouth daily with breakfast for 3 days. Start taking on: May 23, 2023        Discharge Instructions: Please refer to Patient Instructions section of EMR for full details.  Patient was counseled important signs and symptoms that should prompt return to medical care, changes in medications, dietary instructions, activity restrictions, and  follow up appointments.   Follow-Up Appointments:  Future Appointments  Date Time Provider Department Center  05/28/2023  2:50 PM Levin Erp, MD [facility]  08/19/2023  8:30 AM [facility] ANNUAL WELLNESS VISIT [facility]     Lorayne Bender MD 05/22/2023, 4:16 PM PGY-1, [facility]  I agree with the assessment and plan as documented above.  Janeal Holmes, MD PGY-2, [facility]

## 2023-05-22 NOTE — Hospital Course (Addendum)
 Joel Donaldson is a 83 y.o.male with a history of COPD, HTN, RA, prostate cancer and HLD who was admitted to the family medicine teaching Service at [facility] for dyspnea. His hospital course is detailed below:  Acute hypoxic respiratory failure Pneumonia COPD Acute onset dyspnea, found to be hypoxic requiring 2 L [place].  CXR, CT chest and physical exam consistent with right lung PNA.  Received ceftriaxone and azithromycin initially and was transitioned to oral azithromycin and amoxicillin quickly due to swift improvement. Clinically did not meet criteria for COPD exacerbation but treated with prednisone 40 mg x 5 days for symptomatic relief.  Other chronic conditions were medically managed with home medications and formulary alternatives as necessary (CAD, HLD, GERD, chronic pain)  PCP Follow-up Recommendations: Clarify opioid use regimen with both buprenoprhine and percocet Clarify Lasix use- cards note says use PRN for leg swelling but pt reports taking it daily Consider adjusting COPD inhalers

## 2023-05-22 NOTE — Assessment & Plan Note (Deleted)
 CAD HLD GERD Chronic pain Leg swelling: Lasix ?? ***

## 2023-05-23 DIAGNOSIS — I209 Angina pectoris, unspecified: Secondary | ICD-10-CM | POA: Diagnosis not present

## 2023-05-23 DIAGNOSIS — J449 Chronic obstructive pulmonary disease, unspecified: Secondary | ICD-10-CM | POA: Diagnosis not present

## 2023-05-23 DIAGNOSIS — K219 Gastro-esophageal reflux disease without esophagitis: Secondary | ICD-10-CM | POA: Diagnosis not present

## 2023-05-28 ENCOUNTER — Inpatient Hospital Stay: Payer: Self-pay | Admitting: Student

## 2023-05-28 DIAGNOSIS — M25561 Pain in right knee: Secondary | ICD-10-CM | POA: Diagnosis not present

## 2023-05-28 DIAGNOSIS — G8929 Other chronic pain: Secondary | ICD-10-CM | POA: Diagnosis not present

## 2023-05-28 DIAGNOSIS — Z79899 Other long term (current) drug therapy: Secondary | ICD-10-CM | POA: Diagnosis not present

## 2023-05-28 DIAGNOSIS — E559 Vitamin D deficiency, unspecified: Secondary | ICD-10-CM | POA: Diagnosis not present

## 2023-05-28 DIAGNOSIS — R7303 Prediabetes: Secondary | ICD-10-CM | POA: Diagnosis not present

## 2023-05-28 DIAGNOSIS — M25562 Pain in left knee: Secondary | ICD-10-CM | POA: Diagnosis not present

## 2023-05-28 DIAGNOSIS — M549 Dorsalgia, unspecified: Secondary | ICD-10-CM | POA: Diagnosis not present

## 2023-05-28 DIAGNOSIS — Z9181 History of falling: Secondary | ICD-10-CM | POA: Diagnosis not present

## 2023-05-30 ENCOUNTER — Ambulatory Visit (INDEPENDENT_AMBULATORY_CARE_PROVIDER_SITE_OTHER): Admitting: Student

## 2023-05-30 VITALS — BP 124/79 | HR 84 | Ht 70.0 in | Wt 175.6 lb

## 2023-05-30 DIAGNOSIS — J449 Chronic obstructive pulmonary disease, unspecified: Secondary | ICD-10-CM | POA: Diagnosis not present

## 2023-05-30 DIAGNOSIS — R0981 Nasal congestion: Secondary | ICD-10-CM

## 2023-05-30 DIAGNOSIS — J42 Unspecified chronic bronchitis: Secondary | ICD-10-CM

## 2023-05-30 DIAGNOSIS — Z79899 Other long term (current) drug therapy: Secondary | ICD-10-CM | POA: Diagnosis not present

## 2023-05-30 MED ORDER — FLUTICASONE PROPIONATE 50 MCG/ACT NA SUSP: 2.0000 | Freq: Every day | NASAL | 6 refills | Status: AC

## 2023-05-30 MED ORDER — LORATADINE 10 MG PO TABS: 10.0000 mg | ORAL_TABLET | Freq: Every day | ORAL | 11 refills | Status: AC

## 2023-05-30 MED ORDER — STIOLTO RESPIMAT 2.5-2.5 MCG/ACT IN AERS: 2.0000 | INHALATION_SPRAY | Freq: Every day | RESPIRATORY_TRACT | 0 refills | Status: DC

## 2023-05-30 NOTE — Progress Notes (Signed)
    SUBJECTIVE:   CHIEF COMPLAINT / HPI: Hospital fu  Discussed the use of AI scribe software for clinical note transcription with the patient, who gave verbal consent to proceed.  Admitted 4/8-4/9 for hypoxia - CTX and azithromycin, required 2L O2 Treated with prednisone and AZTat discharge and amoxicillin  History of Present Illness The patient, with a history of COPD, presents with ongoing shortness of breath and chest pain. The symptoms have been ongoing since a recent hospitalization for a possible pneumonia or COPD exacerbation. The shortness of breath is particularly noticeable in the mornings and when walking. The patient denies cough, fever, and leg swelling.  The patient also reports nasal congestion, particularly in the mornings. The patient has completed taking prednisone. Is taking Lasix daily and an incruse daily. The patient also reports taking pain medication. The family member notes that the patient has been sleeping a lot during the day and is restless at night.  PCP Follow-up Recommendations: Clarify opioid use regimen with both buprenoprhine and percocet --states only taking percocet? Clarify Lasix use- cards note says use PRN for leg swelling but pt reports taking it daily - taking daily Consider adjusting COPD inhalers - adjusting today   PERTINENT  PMH / PSH: HTN, COPD, AAA, RA, prostate cancer  OBJECTIVE:   BP 124/79   Pulse 84   Ht 5\' 10"  (1.778 m)   Wt 175 lb 9.6 oz (79.7 kg)   SpO2 100%   BMI 25.20 kg/m   General: Well appearing, NAD, awake, alert, responsive to questions Head: Normocephalic atraumatic CV: Regular rate and rhythm no murmurs rubs or gallops Respiratory: Clear to ausculation bilaterally, no wheezes rales or crackles, chest rises symmetrically,  no increased work of breathing Extremities: Moves upper and lower extremities freely, no edema in LE  Ambulatory pulse ox 98 throughout  ASSESSMENT/PLAN:   Assessment & Plan Chronic  obstructive pulmonary disease, unspecified COPD type (HCC) Chronic obstructive pulmonary disease with recent potential exacerbation. Currently stable on Incruse Ellipta and albuterol as needed.  - Given hospitalization, change Incruse to [employer] Complaint of nasal congestion Intermittent shortness of breath likely due to nasal congestion and allergies, not respiratory infection or COPD exacerbation - Prescribe Flonase for nasal congestion. - Prescribe Allegra for allergy symptoms.    Genora Kidd, MD [facility]

## 2023-05-30 NOTE — Patient Instructions (Signed)
 It was great to see you! Thank you for allowing me to participate in your care!   Our plans for today:  - I am changing your daily inhaler to a stronger one - I am prescribing nasal spray and allergy pill  Take care and seek immediate care sooner if you develop any concerns.  Genora Kidd, MD

## 2023-05-30 NOTE — Assessment & Plan Note (Signed)
 Chronic obstructive pulmonary disease with recent potential exacerbation. Currently stable on Incruse Ellipta and albuterol as needed.  - Given hospitalization, change Incruse to Stiolto

## 2023-06-05 DIAGNOSIS — M170 Bilateral primary osteoarthritis of knee: Secondary | ICD-10-CM | POA: Diagnosis not present

## 2023-06-05 DIAGNOSIS — M25562 Pain in left knee: Secondary | ICD-10-CM | POA: Diagnosis not present

## 2023-06-05 DIAGNOSIS — M25561 Pain in right knee: Secondary | ICD-10-CM | POA: Diagnosis not present

## 2023-06-07 ENCOUNTER — Encounter (HOSPITAL_COMMUNITY): Payer: Self-pay | Admitting: Emergency Medicine

## 2023-06-07 ENCOUNTER — Other Ambulatory Visit: Payer: Self-pay

## 2023-06-07 ENCOUNTER — Emergency Department (HOSPITAL_COMMUNITY)

## 2023-06-07 ENCOUNTER — Emergency Department (HOSPITAL_COMMUNITY): Admission: EM | Admit: 2023-06-07 | Discharge: 2023-06-07 | Disposition: A | Discharge status: Home / Self Care

## 2023-06-07 DIAGNOSIS — R062 Wheezing: Secondary | ICD-10-CM | POA: Insufficient documentation

## 2023-06-07 DIAGNOSIS — R079 Chest pain, unspecified: Secondary | ICD-10-CM | POA: Insufficient documentation

## 2023-06-07 DIAGNOSIS — R0602 Shortness of breath: Secondary | ICD-10-CM | POA: Diagnosis not present

## 2023-06-07 DIAGNOSIS — Z7982 Long term (current) use of aspirin: Secondary | ICD-10-CM | POA: Diagnosis not present

## 2023-06-07 DIAGNOSIS — R0789 Other chest pain: Secondary | ICD-10-CM | POA: Diagnosis not present

## 2023-06-07 DIAGNOSIS — R0902 Hypoxemia: Secondary | ICD-10-CM | POA: Diagnosis not present

## 2023-06-07 LAB — BASIC METABOLIC PANEL WITH GFR
Anion gap: 9 (ref 5–15)
BUN: 12 mg/dL (ref 8–23)
CO2: 26 mmol/L (ref 22–32)
Calcium: 8.7 mg/dL — ABNORMAL LOW (ref 8.9–10.3)
Chloride: 101 mmol/L (ref 98–111)
Creatinine, Ser: 0.88 mg/dL (ref 0.61–1.24)
GFR, Estimated: >60 mL/min (ref >60)
Glucose, Bld: 96 mg/dL (ref 70–99)
Potassium: 3.4 mmol/L — ABNORMAL LOW (ref 3.5–5.1)
Sodium: 136 mmol/L (ref 135–145)

## 2023-06-07 LAB — CBC
HCT: 39.1 % (ref 39.0–52.0)
Hemoglobin: 12.6 g/dL — ABNORMAL LOW (ref 13.0–17.0)
MCH: 27.9 pg (ref 26.0–34.0)
MCHC: 32.2 g/dL (ref 30.0–36.0)
MCV: 86.7 fL (ref 80.0–100.0)
Platelets: 250 10*3/uL (ref 150–400)
RBC: 4.51 MIL/uL (ref 4.22–5.81)
RDW: 14.9 % (ref 11.5–15.5)
WBC: 4.2 10*3/uL (ref 4.0–10.5)
nRBC: 0 % (ref 0.0–0.2)

## 2023-06-07 LAB — TROPONIN I (HIGH SENSITIVITY)
Troponin I (High Sensitivity): 6 ng/L (ref <18)
Troponin I (High Sensitivity): 6 ng/L (ref <18)

## 2023-06-07 MED ORDER — IPRATROPIUM-ALBUTEROL 0.5-2.5 (3) MG/3ML IN SOLN
3.0000 mL | Freq: Once | RESPIRATORY_TRACT | Status: AC
  Administered 2023-06-07: 3 mL via RESPIRATORY_TRACT
  Filled 2023-06-07: qty 3

## 2023-06-07 NOTE — Discharge Instructions (Signed)
 Please follow-up with your primary doctor and your cardiologist.  Return immediately if you develop fevers, chills, worsening chest pain, lightheadedness, you pass out or he develop any new or worsening symptoms that are concerning to you.

## 2023-06-07 NOTE — ED Provider Notes (Signed)
 [facility] EMERGENCY DEPARTMENT AT [facility] Provider Note   CSN: [id number] Arrival date & time: 06/07/23  1746     History  Chief Complaint  Patient presents with   Chest Pain   Shortness of Breath    Joel Donaldson is a 83 y.o. male.  83 year old male presenting emergency department chest pain shortness of breath.  Symptoms started this morning at 8 AM, has had waxing and waning symptoms.  They started at rest.  Shortness of breath with ambulation, none currently.  No lower extremity edema, swelling.  No fevers no chills no cough.   Chest Pain Associated symptoms: shortness of breath   Shortness of Breath Associated symptoms: chest pain        Home Medications Prior to Admission medications   Medication Sig Start Date End Date Taking? Authorizing Provider  acetaminophen  (TYLENOL ) 500 MG tablet Take 2 tablets (1,000 mg total) by mouth every 6 (six) hours as needed. Patient taking differently: Take 1,000 mg by mouth daily as needed for moderate pain (pain score 4-6). 10/14/21   Wynetta Heckle, MD  albuterol  (VENTOLIN  HFA) 108 (90 Base) MCG/ACT inhaler INHALE 1 TO 2 PUFFS INTO THE LUNGS EVERY 4 HOURS AS NEEDED FOR WHEEZING OR SHORTNESS OF BREATH 01/09/23   Genora Kidd, MD  aspirin  EC 81 MG tablet Take 81 mg by mouth every morning.     [provider]  atorvastatin  (LIPITOR) 20 MG tablet TAKE 1 TABLET(20 MG) BY MOUTH DAILY 03/14/23   Croitoru, Mihai, MD  buprenorphine  (BUTRANS ) 10 MCG/HR PTWK Place 1 patch onto the skin every Thursday. 04/29/23   [provider]  diclofenac  Sodium (VOLTAREN ) 1 % GEL Apply 4 g topically 4 (four) times daily. Patient taking differently: Apply 4 g topically daily as needed. 01/10/21   Brimage, Vondra, DO  fluticasone  (FLONASE ) 50 MCG/ACT nasal spray Place 2 sprays into both nostrils daily. 05/30/23   Genora Kidd, MD  furosemide  (LASIX ) 40 MG tablet Take 1 tablet (40 mg total) by mouth daily as needed for  edema. 05/22/23   [name], MD  isosorbide  mononitrate (IMDUR ) 30 MG 24 hr tablet Take 1 tablet (30 mg total) by mouth daily. 03/25/23   Genora Kidd, MD  lidocaine  (XYLOCAINE ) 5 % ointment Apply 1 application topically daily as needed for mild pain or moderate pain. 01/10/21   Brimage, Vondra, DO  loratadine  (CLARITIN ) 10 MG tablet Take 1 tablet (10 mg total) by mouth daily. 05/30/23   Genora Kidd, MD  Multiple Vitamins-Minerals (ONE-A-DAY PROACTIVE 65+) TABS Take 1 tablet by mouth daily with breakfast.    [provider]  nitroGLYCERIN  (NITROSTAT ) 0.4 MG SL tablet Place 1 tablet (0.4 mg total) under the tongue every 5 (five) minutes as needed for chest pain (chest pain). 05/25/21   Brimage, Vondra, DO  omeprazole  (PRILOSEC) 40 MG capsule TAKE 1 CAPSULE(40 MG) BY MOUTH DAILY BEFORE BREAKFAST 03/07/23   Genora Kidd, MD  oxyCODONE -acetaminophen  (PERCOCET) 10-325 MG tablet Take 1 tablet by mouth 4 (four) times daily as needed for pain. 02/26/22   [provider]  polyethylene glycol (MIRALAX  / GLYCOLAX ) 17 g packet Take 17 g by mouth daily as needed for mild constipation. 01/10/21   Brimage, Vondra, DO  Tiotropium Bromide -Olodaterol (STIOLTO RESPIMAT ) 2.5-2.5 MCG/ACT AERS Inhale 2 puffs into the lungs daily. 05/30/23   Genora Kidd, MD      Allergies    Tramadol  and Pollen extract    Review of Systems   Review of Systems  Respiratory:  Positive for shortness of breath.   Cardiovascular:  Positive for chest pain.    Physical Exam Updated Vital Signs BP 122/79 (BP Location: Right Arm)   Pulse 78   Temp 98.2 F (36.8 C) (Oral)   Resp 18   Ht 5\' 10"  (1.778 m)   Wt 80.7 kg   SpO2 100%   BMI 25.54 kg/m  Physical Exam Vitals and nursing note reviewed.  HENT:     Head: Normocephalic.  Cardiovascular:     Rate and Rhythm: Normal rate.     Pulses:          Radial pulses are 2+ on the right side and 2+ on the left side.     Heart sounds: Normal heart  sounds.  Pulmonary:     Effort: Pulmonary effort is normal.     Breath sounds: Wheezing (faint) present.  Musculoskeletal:     Right lower leg: No edema.     Left lower leg: No edema.  Skin:    General: Skin is warm.     Capillary Refill: Capillary refill takes less than 2 seconds.  Neurological:     Mental Status: He is alert and oriented to person, place, and time.  Psychiatric:        Mood and Affect: Mood normal.        Behavior: Behavior normal.     ED Results / Procedures / Treatments   Labs (all labs ordered are listed, but only abnormal results are displayed) Labs Reviewed  CBC - Abnormal; Notable for the following components:      Result Value   Hemoglobin 12.6 (*)    All other components within normal limits  BASIC METABOLIC PANEL WITH GFR - Abnormal; Notable for the following components:   Potassium 3.4 (*)    Calcium  8.7 (*)    All other components within normal limits  TROPONIN I (HIGH SENSITIVITY)  TROPONIN I (HIGH SENSITIVITY)    EKG EKG Interpretation Date/Time:  Friday June 07 2023 17:54:00 EDT Ventricular Rate:  54 PR Interval:  186 QRS Duration:  94 QT Interval:  442 QTC Calculation: 419 R Axis:   1  Text Interpretation: Sinus rhythm Supraventricular bigeminy Confirmed by Elise Guile [phone]) on 06/07/2023 6:17:51 PM  Radiology DG Chest 2 View Result Date: 06/07/2023 CLINICAL DATA:  Left-sided chest pain EXAM: CHEST - 2 VIEW COMPARISON:  05/21/2023 FINDINGS: Frontal and lateral views of the chest demonstrate an unremarkable cardiac silhouette. No acute airspace disease, effusion, or pneumothorax. No acute bony abnormalities. IMPRESSION: 1. No acute intrathoracic process. Electronically Signed   By: Bobbye Burrow M.D.   On: 06/07/2023 19:05    Procedures Procedures    Medications Ordered in ED Medications  ipratropium-albuterol  (DUONEB) 0.5-2.5 (3) MG/3ML nebulizer solution 3 mL (3 mLs Nebulization Given 06/07/23 1924)    ED Course/  Medical Decision Making/ A&P Clinical Course as of 06/07/23 2126  Fri Jun 07, 2023  1758 Admitted 4/8 and per d/c summary : " Acute hypoxic respiratory failure Pneumonia COPD Acute onset dyspnea, found to be hypoxic requiring 2 L [place].  CXR, CT chest and physical exam consistent with right lung PNA.  Received ceftriaxone  and azithromycin  initially and was transitioned to oral azithromycin  and amoxicillin  quickly due to swift improvement. Clinically did not meet criteria for COPD exacerbation but treated with prednisone  40 mg x 5 days for symptomatic relief."  [TY]  1825 Cath in 2022: "  LPAV lesion is 60% stenosed.   Dist  LM to Ost LAD lesion is 30% stenosed.   The left ventricular systolic function is normal.   SUMMARY  Mild diffuse multivessel disease with no obvious significant stenosis.    Most notable stenosis is in the distal AV groove LCx roughly 60% (not positive by CT FFR).  Significant systemic hypertension with normal LV EDP.  Normal LV function with no obvious wall motion abnormality  Suspect MICROVASCULAR DISEASE - (NINOCA) " [TY]  1825 Echo 09/28/22: "1. Left ventricular ejection fraction, by estimation, is 55%. The left  ventricle has normal function. The left ventricle demonstrates regional  wall motion abnormalities with possible basal inferolateral hypokinesis.  Left ventricular diastolic parameters   are consistent with Grade I diastolic dysfunction (impaired relaxation). "  [TY]  1944 CP improved after DuoNeb. [TY]  2121 Troponin I (High Sensitivity): 6 Troponin is negative x 2.  ACS unlikely. [TY]  2122 Patient chest pain-free.  Symptoms secondary to COPD?  Clear chest x-ray.  Low suspicion for PE.  Patient to follow-up with cardiology outpatient.  Return precautions given. [TY]    Clinical Course User Index [TY] Rolinda Climes, DO                                 Medical Decision Making Is a 83 year old male presenting emergency department for chest pain.   Hypertensive on arrival, improved without intervention.  EKG does not appear to have ST segment changes to indicate ischemia.  Initial troponin negative.  Chest x-ray without pneumonia pneumothorax.  No leukocytosis to suggest systemic infection.  Basic metabolic panel with minor hypokalemia.  Awaiting delta troponin.  Will give breathing treatment as well, per chart review appears patient with suspected COPD.  He is not hypoxic.  Low suspicion for PE.  Amount and/or Complexity of Data Reviewed External Data Reviewed:     Details: See ED course Labs: ordered. Decision-making details documented in ED Course. Radiology: ordered.  Risk Prescription drug management.          Final Clinical Impression(s) / ED Diagnoses Final diagnoses:  None    Rx / DC Orders ED Discharge Orders     None         Rolinda Climes, DO 06/07/23 2126

## 2023-06-07 NOTE — ED Triage Notes (Signed)
 Pt BIB [facility] from a house for CP, SOB since 8:30 am.  SOB increased with exertion.  Maybe COPD but unconfirmed.  No cardiac hx.  12-lead unremarkable  324 mg ASA  100% RR 16, HR 70 152/86

## 2023-06-11 ENCOUNTER — Encounter: Payer: Self-pay | Admitting: Physician Assistant

## 2023-06-11 ENCOUNTER — Ambulatory Visit: Attending: Physician Assistant | Admitting: Physician Assistant

## 2023-06-11 VITALS — BP 94/50 | HR 100 | Ht 70.0 in | Wt 170.8 lb

## 2023-06-11 DIAGNOSIS — J432 Centrilobular emphysema: Secondary | ICD-10-CM

## 2023-06-11 DIAGNOSIS — I25118 Atherosclerotic heart disease of native coronary artery with other forms of angina pectoris: Secondary | ICD-10-CM | POA: Diagnosis not present

## 2023-06-11 DIAGNOSIS — I10 Essential (primary) hypertension: Secondary | ICD-10-CM

## 2023-06-11 MED ORDER — ISOSORBIDE MONONITRATE ER 30 MG PO TB24
15.0000 mg | ORAL_TABLET | Freq: Every day | ORAL | Status: AC
Start: 2023-06-11 — End: 2023-12-06

## 2023-06-11 NOTE — Patient Instructions (Addendum)
 Medication Instructions:  Your physician has recommended you make the following change in your medication:  REDUCE the Isosorbide  (Imdur ) to 30 mg taking only 1/2 tablet daily  *If you need a refill on your cardiac medications before your next appointment, please call your pharmacy*  Lab Work: None ordered  If you have labs (blood work) drawn today and your tests are completely normal, you will receive your results only by: MyChart Message (if you have MyChart) OR A paper copy in the mail If you have any lab test that is abnormal or we need to change your treatment, we will call you to review the results.  Testing/Procedures:    Please report to Radiology at the [facility] Main Entrance 30 minutes early for your test.  [address] [place], [place] [address]                         OR   Please report to Radiology at [facility] Main Entrance, medical mall, 30 mins prior to your test.  [address]  [place], [place]  How to Prepare for Your Cardiac PET/CT Stress Test:  Nothing to eat or drink, except water , 3 hours prior to arrival time.  NO caffeine/decaffeinated products, or chocolate 12 hours prior to arrival. (Please note decaffeinated beverages (teas/coffees) still contain caffeine).  If you have caffeine within 12 hours prior, the test will need to be rescheduled.  Medication instructions: Do not take erectile dysfunction medications for 72 hours prior to test (sildenafil, tadalafil) Do not take nitrates isosorbide  mononitrate the day before or day of test Do not take tamsulosin the day before or morning of test Hold theophylline containing medications for 12 hours. Hold Dipyridamole 48 hours prior to the test.  Diabetic Preparation: If able to eat breakfast prior to 3 hour fasting, you may take all medications, including your insulin. Do not worry if you miss your breakfast dose of insulin - start at your next meal. If you do  not eat prior to 3 hour fast-Hold all diabetes (oral and insulin) medications. Patients who wear a continuous glucose monitor MUST remove the device prior to scanning.  You may take your remaining medications with water .  NO perfume, cologne or lotion on chest or abdomen area. FEMALES - Please avoid wearing dresses to this appointment.  Total time is 1 to 2 hours; you may want to bring reading material for the waiting time.  IF YOU THINK YOU MAY BE PREGNANT, OR ARE NURSING PLEASE INFORM THE TECHNOLOGIST.  In preparation for your appointment, medication and supplies will be purchased.  Appointment availability is limited, so if you need to cancel or reschedule, please call the Radiology Department Scheduler at [phone] 24 hours in advance to avoid a cancellation fee of $100.00  What to Expect When you Arrive:  Once you arrive and check in for your appointment, you will be taken to a preparation room within the Radiology Department.  A technologist or Nurse will obtain your medical history, verify that you are correctly prepped for the exam, and explain the procedure.  Afterwards, an IV will be started in your arm and electrodes will be placed on your skin for EKG monitoring during the stress portion of the exam. Then you will be escorted to the PET/CT scanner.  There, staff will get you positioned on the scanner and obtain a blood pressure and EKG.  During the exam, you will continue to  be connected to the EKG and blood pressure machines.  A small, safe amount of a radioactive tracer will be injected in your IV to obtain a series of pictures of your heart along with an injection of a stress agent.    After your Exam:  It is recommended that you eat a meal and drink a caffeinated beverage to counter act any effects of the stress agent.  Drink plenty of fluids for the remainder of the day and urinate frequently for the first couple of hours after the exam.  Your doctor will inform you of your  test results within 7-10 business days.  For more information and frequently asked questions, please visit our website: [email or web address]  For questions about your test or how to prepare for your test, please call: Cardiac Imaging Nurse Navigators Office: [phone]     You have been referred to [facility] Pulmonology for Shortness of Breath / COPD.  They will reach out to you to schedule that appointment.    Follow-Up: At [facility], you and your health needs are our priority.  As part of our continuing mission to provide you with exceptional heart care, our providers are all part of one team.  This team includes your primary Cardiologist (physician) and Advanced Practice Providers or APPs (Physician Assistants and Nurse Practitioners) who all work together to provide you with the care you need, when you need it.  Your next appointment:   3-4 month(s)  Provider:   Luana Rumple, MD    We recommend signing up for the patient portal called "MyChart".  Sign up information is provided on this After Visit Summary.  MyChart is used to connect with patients for Virtual Visits (Telemedicine).  Patients are able to view lab/test results, encounter notes, upcoming appointments, etc.  Non-urgent messages can be sent to your provider as well.   To learn more about what you can do with MyChart, go to [email or web address].   Other Instructions

## 2023-06-11 NOTE — Assessment & Plan Note (Signed)
 As noted, he has a prior history of smoking.  Chest x-ray in the past demonstrated emphysema.  He feels better when he uses inhalers.  I suspect his shortness of breath is multifactorial related to deconditioning, CAD, COPD.  I believe the main contributor is COPD.  We discussed  +/- referral to pulmonology.  He would like to see pulmonology for further evaluation.  As noted, BNP was normal in the emergency room recently.  Echocardiogram August 2024 demonstrated EF 55 with mild diastolic dysfunction and mild pulmonary hypertension. -Refer to pulmonology

## 2023-06-11 NOTE — Addendum Note (Signed)
 Addended byReyne Cave, Geralyn Knee T on: 06/11/2023 06:09 PM   Modules accepted: Orders

## 2023-06-11 NOTE — Progress Notes (Signed)
 Cardiology Office Note:    Date:  06/11/2023  ID:  Joel Donaldson, DOB [birth year], MRN [id number] PCP: Joel Kidd, MD  [facility] Providers Cardiologist:  Joel Rumple, MD       Patient Profile:      Coronary artery disease INOCA (Ischemia with Non-Obstructive Coronary Arteries)  CCTA 08/26/20: CAC score 1523 (90th percentile), severe dLCx > 70 LHC 09/16/20: LPAV 60, dLM 30 TTE 09/28/22: EF 55, basal inf-lat HK, Gr 1 DD, NL RVSF, mild pulm HTN (RVSP 43), trivial AI, mild AV Ca2+ Rx: Nitrates PACs Monitor 03/2022: PACs 7%, ectopic atrial tachycardia; no AFib; 3 episodes NSVT (3 beats)  Hypertension  Hyperlipidemia  Aortic atherosclerosis Prostate CA s/p XR seed implant Rheumatoid arthritis  Hollenhorst plaque noted in 09/2022 Carotid US  10/11/22: no ICA stenosis  COPD/emphysema       Discussed the use of AI scribe software for clinical note transcription with the patient, who gave verbal consent to proceed.  History of Present Illness Joel Donaldson is a 83 y.o. male who returns for follow-up after recent visit to the emergency room. He was last seen in clinic by Joel Pray, NP 12/2022.  His chest discomfort was improved on isosorbide  mononitrate.  He was admitted 4/8-4/9 with acute hypoxic respiratory failure secondary to pneumonia.  He was treated with antibiotics.  He was recently seen in the emergency room 06/07/23 with chest pain and shortness of breath.  Troponin remained negative x 2.  Chest x-ray demonstrated no acute process.  EKG was personally reviewed and demonstrated no acute ST-T wave changes.  He is here with his friend. The chest pain was described as constant and was accompanied by significant shortness of breath, which was severe enough to require ambulance assistance. He experiences shortness of breath more frequently than chest pain. The shortness of breath occurs with physical exertion, such as walking, and can also be triggered by emotional  upset. He experiences shortness of breath in the morning and cannot walk up a flight of stairs or a block without stopping. He uses two pillows at night to breathe comfortably and denies any leg swelling. He has lost weight recently and does not experience a cough. He uses inhalers, which provide some relief for his breathing difficulties. He smoked for about 20 years but quit some time ago. He has not seen a lung doctor.   ROS-See HPI    Studies Reviewed:       Results    Risk Assessment/Calculations:             Physical Exam:   VS:  BP (!) 94/50   Pulse 100   Ht 5\' 10"  (1.778 m)   Wt 170 lb 12.8 oz (77.5 kg)   SpO2 96%   BMI 24.51 kg/m    Wt Readings from Last 3 Encounters:  06/11/23 170 lb 12.8 oz (77.5 kg)  06/07/23 178 lb (80.7 kg)  05/30/23 175 lb 9.6 oz (79.7 kg)    Constitutional:      Appearance: Healthy appearance. Not in distress.  Neck:     Vascular: JVD normal.  Pulmonary:     Breath sounds: Normal breath sounds. No wheezing. No rales.  Cardiovascular:     Normal rate. Regular rhythm.     Murmurs: There is no murmur.  Edema:    Peripheral edema absent.  Abdominal:     Palpations: Abdomen is soft.        Assessment and Plan:   Assessment &  Plan Coronary artery disease of native artery of native heart with stable angina pectoris [facility]) CT in July 2022 with calcium  score 1523 cardiac catheterization August 2022 with 60% left posterior AV branch 60% stenosis distal left main 30% stenosis.  He has responded well to nitrates in the past.  He had prolonged chest pain when he visited the emergency room on 4/25.  He has not really had recurrent pain.  Troponins were negative at that time.  EKG was also negative.  His main complaint is shortness of breath with exertion.  He describes NYHA III symptoms.  When he was admitted for pneumonia earlier this month, BNP was perfectly normal.  Chest x-ray in the past has demonstrated emphysema.  I do not see formal PFTs in his  chart.  I suspect all of his symptoms are related to COPD/emphysema.  I have recommended proceeding with stress testing to rule out the possibility of ischemia. -Continue ASA 81 mg daily, Lipitor 20 mg daily, nitroglycerin  as needed -Reduce Imdur  to 15 mg daily given hypotension -Arrange stress PET MPI -Follow-up 3 to 4 months Centrilobular emphysema (HCC) As noted, he has a prior history of smoking.  Chest x-ray in the past demonstrated emphysema.  He feels better when he uses inhalers.  I suspect his shortness of breath is multifactorial related to deconditioning, CAD, COPD.  I believe the main contributor is COPD.  We discussed  +/- referral to pulmonology.  He would like to see pulmonology for further evaluation.  As noted, BNP was normal in the emergency room recently.  Echocardiogram August 2024 demonstrated EF 55 with mild diastolic dysfunction and mild pulmonary hypertension. -Refer to pulmonology Essential hypertension Blood pressure running somewhat low.  Decrease isosorbide  mononitrate to 15 mg daily.      Informed Consent   Shared Decision Making/Informed Consent The risks [chest pain, shortness of breath, cardiac arrhythmias, dizziness, blood pressure fluctuations, myocardial infarction, stroke/transient ischemic attack, nausea, vomiting, allergic reaction, radiation exposure, metallic taste sensation and life-threatening complications (estimated to be 1 in 10,000)], benefits (risk stratification, diagnosing coronary artery disease, treatment guidance) and alternatives of a cardiac PET stress test were discussed in detail with [name] and he agrees to proceed.     Dispo:  Return in about 4 months (around 10/11/2023) for Routine Follow Up w/ Dr. Alvis Donaldson.  Signed, Joel Single, PA-C

## 2023-06-12 ENCOUNTER — Encounter: Payer: Self-pay | Admitting: Podiatry

## 2023-06-12 ENCOUNTER — Other Ambulatory Visit: Payer: Self-pay | Admitting: Student

## 2023-06-12 ENCOUNTER — Ambulatory Visit (INDEPENDENT_AMBULATORY_CARE_PROVIDER_SITE_OTHER): Admitting: Podiatry

## 2023-06-12 DIAGNOSIS — M79675 Pain in left toe(s): Secondary | ICD-10-CM | POA: Diagnosis not present

## 2023-06-12 DIAGNOSIS — M79674 Pain in right toe(s): Secondary | ICD-10-CM

## 2023-06-12 DIAGNOSIS — B351 Tinea unguium: Secondary | ICD-10-CM | POA: Diagnosis not present

## 2023-06-12 NOTE — Progress Notes (Signed)
   Chief Complaint  Patient presents with   RFC    RM#8 RFC no concerns today.    SUBJECTIVE Patient presents to office today complaining of elongated, thickened nails that cause pain while ambulating in shoes.  Patient is unable to trim their own nails. Patient is here for further evaluation and treatment.  Past Medical History:  Diagnosis Date   Acute nonintractable headache 05/31/2021   Alcohol abuse    Anxiety    Arthritis    hands, back, knees   Asthma    Atypical angina (HCC) (Class III) 12/19/2011   Baker's cyst of knee, right 06/21/2016   Chest pain at rest 10/04/2016   Depression    Epigastric pain 05/20/2020   Fall 10/23/2021   GERD (gastroesophageal reflux disease)    Glaucoma    Heart murmur asymptomatic   Hx of radioisotope therapy 07/26/2011   Radiosactive Prostate Seed Implant/ I-125 Seeds   Hypertension    Neck pain 10/22/2019   Prostate cancer (HCC) dx'd 2016 or 2017   seed implants   Rheumatoid arthritis (HCC) 10/10/2012    Allergies  Allergen Reactions   Tramadol  Nausea Only   Pollen Extract Other (See Comments)    Sneezing     OBJECTIVE General Patient is awake, alert, and oriented x 3 and in no acute distress. Derm Skin is dry and supple bilateral. Negative open lesions or macerations. Remaining integument unremarkable. Nails are tender, long, thickened and dystrophic with subungual debris, consistent with onychomycosis, 1-5 bilateral. No signs of infection noted. Vasc  DP and PT pedal pulses palpable bilaterally. Temperature gradient within normal limits.  Neuro Epicritic and protective threshold sensation grossly intact bilaterally.  Musculoskeletal Exam No symptomatic pedal deformities noted bilateral. Muscular strength within normal limits.  ASSESSMENT 1.  Pain due to onychomycosis of toenails both  PLAN OF CARE 1. Patient evaluated today.  2. Instructed to maintain good pedal hygiene and foot care.  3. Mechanical debridement of nails  1-5 bilaterally performed using a nail nipper. Filed with dremel without incident.  4. Return to clinic in 3 mos.    Dot Gazella, DPM [facility]  Dr. Dot Gazella, DPM    [address] [place], [place] [address]                Office [phone]  Fax [phone]

## 2023-06-13 ENCOUNTER — Encounter: Payer: Self-pay | Admitting: Student

## 2023-06-13 ENCOUNTER — Ambulatory Visit (INDEPENDENT_AMBULATORY_CARE_PROVIDER_SITE_OTHER): Admitting: Student

## 2023-06-13 VITALS — BP 125/95 | HR 76 | Ht 70.0 in | Wt 171.4 lb

## 2023-06-13 DIAGNOSIS — I10 Essential (primary) hypertension: Secondary | ICD-10-CM

## 2023-06-13 DIAGNOSIS — R079 Chest pain, unspecified: Secondary | ICD-10-CM | POA: Diagnosis not present

## 2023-06-13 NOTE — Progress Notes (Signed)
    SUBJECTIVE:   CHIEF COMPLAINT / HPI: ED f/u  Discussed the use of AI scribe software for clinical note transcription with the patient, who gave verbal consent to proceed.  ED visit 06/07/23 for chest pain - trops negative, CXR clear, told to follow up with cardiology outpt. Was given breathing treatment with resolution.   History of Present Illness Joel Donaldson is an 83 year old male who presents with shortness of breath and chest pain.  He experienced shortness of breath and chest pain, prompting an emergency department visit where the workup was reassuring. Since then, he has not had any chest pain or shortness of breath. Cardiology recommended reducing his Isosorbide  mononitrate from 30 mg to 15 mg due to concerns about low blood pressure, but he has not yet picked up the new prescription. His blood pressure has been higher recently, though he has not been monitoring it at home.  He has COPD and received inhalers and a breathing treatment in the ER, which alleviated his symptoms. He was switched from Incruse to Stiolto inhaler at last visit. He also uses a nasal spray and Allegra for allergies, which help somewhat with congestion. He reports no dizziness and no recent wheezing.   PERTINENT  PMH / PSH: AAA, HTN, COPD, GERD, opioid dependence  OBJECTIVE:   BP (!) 125/95   Pulse 76   Ht 5\' 10"  (1.778 m)   Wt 171 lb 6.4 oz (77.7 kg)   SpO2 100%   BMI 24.59 kg/m   General: Well appearing, NAD, awake, alert, responsive to questions Head: Normocephalic atraumatic CV: Regular rate and rhythm  Respiratory: Clear to ausculation bilaterally, no wheezes rales or crackles, chest rises symmetrically,  no increased work of breathing on RA Extremities: Moves upper and lower extremities freely, no edema in LE ASSESSMENT/PLAN:   Assessment & Plan Chest pain, unspecified type Recent episodes led to ED visit. No episodes since. Cardiology advised reducing Isosorbide  mononitrate due to  hypotension. No recent episodes or dyspnea since. Possibly related to COPD given improvement after breathing treatment in ED. - Proceed with stress scan as recommended by cardiology - Has been referred to pulmonology as well   Hypertension, unspecified type Cardiology adjusted adjusted Isosorbide  mononitrate dose due to episodes of hypotension - Ensure he starts new prescription of Isosorbide  mononitrate 15 mg.   Genora Kidd, MD [facility]

## 2023-06-13 NOTE — Patient Instructions (Signed)
 It was great to see you! Thank you for allowing me to participate in your care!   Our plans for today:  - Please pick up the lower dose of the isosorbide  mononitrate (15 mg daily from what the cardology doctors stated) - They referred you to pulmonology - Keep follow ups with heart doctors about stress test - Let us  know what blood pressures are after starting the lower dose of Imdur  15 mg  Take care and seek immediate care sooner if you develop any concerns.  Genora Kidd, MD

## 2023-06-13 NOTE — Assessment & Plan Note (Signed)
 Cardiology adjusted adjusted Isosorbide  mononitrate dose due to episodes of hypotension - Ensure he starts new prescription of Isosorbide  mononitrate 15 mg.

## 2023-06-15 ENCOUNTER — Encounter (HOSPITAL_COMMUNITY): Payer: Self-pay

## 2023-06-15 ENCOUNTER — Emergency Department (HOSPITAL_COMMUNITY)
Admission: EM | Admit: 2023-06-15 | Discharge: 2023-06-15 | Disposition: A | Discharge status: Home / Self Care | Attending: Emergency Medicine | Admitting: Emergency Medicine

## 2023-06-15 ENCOUNTER — Other Ambulatory Visit: Payer: Self-pay

## 2023-06-15 ENCOUNTER — Emergency Department (HOSPITAL_COMMUNITY)

## 2023-06-15 DIAGNOSIS — Z7951 Long term (current) use of inhaled steroids: Secondary | ICD-10-CM | POA: Insufficient documentation

## 2023-06-15 DIAGNOSIS — R404 Transient alteration of awareness: Secondary | ICD-10-CM | POA: Diagnosis not present

## 2023-06-15 DIAGNOSIS — J449 Chronic obstructive pulmonary disease, unspecified: Secondary | ICD-10-CM | POA: Insufficient documentation

## 2023-06-15 DIAGNOSIS — R110 Nausea: Secondary | ICD-10-CM | POA: Diagnosis not present

## 2023-06-15 DIAGNOSIS — R0989 Other specified symptoms and signs involving the circulatory and respiratory systems: Secondary | ICD-10-CM | POA: Diagnosis not present

## 2023-06-15 DIAGNOSIS — R531 Weakness: Secondary | ICD-10-CM | POA: Diagnosis not present

## 2023-06-15 DIAGNOSIS — E860 Dehydration: Secondary | ICD-10-CM | POA: Insufficient documentation

## 2023-06-15 DIAGNOSIS — Z7982 Long term (current) use of aspirin: Secondary | ICD-10-CM | POA: Diagnosis not present

## 2023-06-15 DIAGNOSIS — R42 Dizziness and giddiness: Secondary | ICD-10-CM | POA: Diagnosis not present

## 2023-06-15 DIAGNOSIS — R6889 Other general symptoms and signs: Secondary | ICD-10-CM | POA: Diagnosis not present

## 2023-06-15 LAB — COMPREHENSIVE METABOLIC PANEL WITH GFR
ALT: 19 U/L (ref 0–44)
AST: 20 U/L (ref 15–41)
Albumin: 3.8 g/dL (ref 3.5–5.0)
Alkaline Phosphatase: 52 U/L (ref 38–126)
Anion gap: 9 (ref 5–15)
BUN: 15 mg/dL (ref 8–23)
CO2: 27 mmol/L (ref 22–32)
Calcium: 9.1 mg/dL (ref 8.9–10.3)
Chloride: 103 mmol/L (ref 98–111)
Creatinine, Ser: 0.86 mg/dL (ref 0.61–1.24)
GFR, Estimated: >60 mL/min (ref >60)
Glucose, Bld: 102 mg/dL — ABNORMAL HIGH (ref 70–99)
Potassium: 3.4 mmol/L — ABNORMAL LOW (ref 3.5–5.1)
Sodium: 139 mmol/L (ref 135–145)
Total Bilirubin: 0.8 mg/dL (ref 0.0–1.2)
Total Protein: 6.6 g/dL (ref 6.5–8.1)

## 2023-06-15 LAB — CBC
HCT: 39.1 % (ref 39.0–52.0)
Hemoglobin: 12.6 g/dL — ABNORMAL LOW (ref 13.0–17.0)
MCH: 28.3 pg (ref 26.0–34.0)
MCHC: 32.2 g/dL (ref 30.0–36.0)
MCV: 87.7 fL (ref 80.0–100.0)
Platelets: 214 10*3/uL (ref 150–400)
RBC: 4.46 MIL/uL (ref 4.22–5.81)
RDW: 15.1 % (ref 11.5–15.5)
WBC: 5.3 10*3/uL (ref 4.0–10.5)
nRBC: 0 % (ref 0.0–0.2)

## 2023-06-15 MED ORDER — LACTATED RINGERS IV BOLUS
500.0000 mL | Freq: Once | INTRAVENOUS | Status: AC
Start: 2023-06-15 — End: 2023-06-15
  Administered 2023-06-15: 500 mL via INTRAVENOUS

## 2023-06-15 MED ORDER — ONDANSETRON HCL 4 MG/2ML IJ SOLN
4.0000 mg | Freq: Once | INTRAMUSCULAR | Status: AC
  Administered 2023-06-15: 4 mg via INTRAVENOUS
  Filled 2023-06-15: qty 2

## 2023-06-15 MED ORDER — ONDANSETRON 8 MG PO TBDP: 8.0000 mg | ORAL_TABLET | Freq: Three times a day (TID) | ORAL | 0 refills | Status: DC | PRN

## 2023-06-15 NOTE — ED Triage Notes (Signed)
 Pt BIB [facility] from home d/t dizziness for 1 day. No n/v. Stroke screen negative w [facility]. Ambulatory. A&O X4. No HA. VSS.

## 2023-06-15 NOTE — ED Provider Notes (Signed)
 [place] [facility] EMERGENCY DEPARTMENT AT [facility] Provider Note   CSN: [id number] Arrival date & time: 06/15/23  0756     History  Chief Complaint  Patient presents with   Dizziness    Joel Donaldson is a 83 y.o. male.  HPI 83 year old male history of COPD, alcohol use, rheumatoid arthritis, presents today complaining of nausea and lightheadedness.  He states that yesterday he felt nauseated and did not eat or drink very well.  Today he has felt lightheaded and somewhat off balance.  He still has not had anything to eat or drink.  He denies any pain, head injury, visual changes, lateralized weakness.  Heart test done to look for blockages this Wednesday but states that he is not having any chest pain or dyspnea at this time or during the past 24 to 36 hours.  He has not noted fever, chills, cough, or dyspnea.  He denies abdominal pain but states that he just has not felt like eating     Home Medications Prior to Admission medications   Medication Sig Start Date End Date Taking? Authorizing Provider  ondansetron  (ZOFRAN -ODT) 8 MG disintegrating tablet Take 1 tablet (8 mg total) by mouth every 8 (eight) hours as needed for nausea or vomiting. 06/15/23  Yes [name], MD  acetaminophen  (TYLENOL ) 500 MG tablet Take 2 tablets (1,000 mg total) by mouth every 6 (six) hours as needed. 10/14/21   Wynetta Heckle, MD  albuterol  (VENTOLIN  HFA) 108 (90 Base) MCG/ACT inhaler INHALE 1 TO 2 PUFFS INTO THE LUNGS EVERY 4 HOURS AS NEEDED FOR WHEEZING OR SHORTNESS OF BREATH 01/09/23   Genora Kidd, MD  aspirin  EC 81 MG tablet Take 81 mg by mouth every morning.     [provider]  atorvastatin  (LIPITOR) 20 MG tablet TAKE 1 TABLET(20 MG) BY MOUTH DAILY 03/14/23   Croitoru, Mihai, MD  buprenorphine  (BUTRANS ) 10 MCG/HR PTWK Place 1 patch onto the skin every Thursday. 04/29/23   [provider]  diclofenac  Sodium (VOLTAREN ) 1 % GEL Apply 4 g topically 4 (four) times daily.  01/10/21   Brimage, Vondra, DO  fluticasone  (FLONASE ) 50 MCG/ACT nasal spray Place 2 sprays into both nostrils daily. 05/30/23   Genora Kidd, MD  furosemide  (LASIX ) 40 MG tablet Take 1 tablet (40 mg total) by mouth daily as needed for edema. 05/22/23   [name], MD  isosorbide  mononitrate (IMDUR ) 30 MG 24 hr tablet Take 0.5 tablets (15 mg total) by mouth daily. 06/11/23 09/09/23  [name], PA-C  lidocaine  (XYLOCAINE ) 5 % ointment Apply 1 application topically daily as needed for mild pain or moderate pain. 01/10/21   Brimage, Vondra, DO  loratadine  (CLARITIN ) 10 MG tablet Take 1 tablet (10 mg total) by mouth daily. 05/30/23   Genora Kidd, MD  Multiple Vitamins-Minerals (ONE-A-DAY PROACTIVE 65+) TABS Take 1 tablet by mouth daily with breakfast.    [provider]  nitroGLYCERIN  (NITROSTAT ) 0.4 MG SL tablet Place 1 tablet (0.4 mg total) under the tongue every 5 (five) minutes as needed for chest pain (chest pain). 05/25/21   Brimage, Vondra, DO  omeprazole  (PRILOSEC) 40 MG capsule TAKE 1 CAPSULE(40 MG) BY MOUTH DAILY BEFORE BREAKFAST 06/12/23   Genora Kidd, MD  oxyCODONE -acetaminophen  (PERCOCET) 10-325 MG tablet Take 1 tablet by mouth 4 (four) times daily as needed for pain. 02/26/22   [provider]  polyethylene glycol (MIRALAX  / GLYCOLAX ) 17 g packet Take 17 g by mouth daily as needed for mild constipation.  01/10/21   Brimage, Vondra, DO  Tiotropium Bromide -Olodaterol (STIOLTO RESPIMAT ) 2.5-2.5 MCG/ACT AERS Inhale 2 puffs into the lungs daily. 05/30/23   Genora Kidd, MD      Allergies    Tramadol  and Pollen extract    Review of Systems   Review of Systems  Physical Exam Updated Vital Signs BP 118/86   Pulse 98   Temp 98.6 F (37 C) (Oral)   Resp 20   SpO2 97%  Physical Exam Vitals reviewed.  Constitutional:      Appearance: Normal appearance. He is normal weight.  HENT:     Head: Normocephalic and atraumatic.     Right Ear: External ear  normal.     Left Ear: External ear normal.     Nose: Nose normal.     Mouth/Throat:     Pharynx: Oropharynx is clear.  Eyes:     Conjunctiva/sclera: Conjunctivae normal.     Pupils: Pupils are equal, round, and reactive to light.     Comments: Pupils are constricted bilaterally and equal  Cardiovascular:     Rate and Rhythm: Normal rate and regular rhythm.  Pulmonary:     Effort: Pulmonary effort is normal.     Breath sounds: Normal breath sounds.  Abdominal:     General: Abdomen is flat. Bowel sounds are normal.     Palpations: Abdomen is soft.  Musculoskeletal:        General: Normal range of motion.  Skin:    General: Skin is warm.     Capillary Refill: Capillary refill takes less than 2 seconds.  Neurological:     General: No focal deficit present.     Mental Status: He is alert.  Psychiatric:        Mood and Affect: Mood normal.     ED Results / Procedures / Treatments   Labs (all labs ordered are listed, but only abnormal results are displayed) Labs Reviewed  CBC - Abnormal; Notable for the following components:      Result Value   Hemoglobin 12.6 (*)    All other components within normal limits  COMPREHENSIVE METABOLIC PANEL WITH GFR - Abnormal; Notable for the following components:   Potassium 3.4 (*)    Glucose, Bld 102 (*)    All other components within normal limits    EKG EKG Interpretation Date/Time:  Saturday Jun 15 2023 08:41:46 EDT Ventricular Rate:  99 PR Interval:    QRS Duration:  100 QT Interval:  354 QTC Calculation: 455 R Axis:   -16  Text Interpretation: Borderline left axis deviation Artifact in lead(s) I II III aVR aVL aVF V1 artifact precludes acurat interpretation please credit Confirmed by [name] [phone]) on 06/15/2023 11:53:53 AM  Radiology DG Chest Port 1 View Result Date: 06/15/2023 CLINICAL DATA:  weakness EXAM: PORTABLE CHEST - 1 VIEW COMPARISON:  06/07/2023 FINDINGS: Low lung volumes with crowding of perihilar  bronchovascular markings. No confluent airspace disease. Heart size and mediastinal contours are within normal limits. No effusion. Visualized bones unremarkable. IMPRESSION: Low volumes. No acute findings. Electronically Signed   By: Nicoletta Barrier M.D.   On: 06/15/2023 08:39    Procedures Procedures    Medications Ordered in ED Medications  lactated ringers  bolus 500 mL (500 mLs Intravenous New Bag/Given 06/15/23 0859)  ondansetron  (ZOFRAN ) injection 4 mg (4 mg Intravenous Given 06/15/23 1031)    ED Course/ Medical Decision Making/ A&P Clinical Course as of 06/15/23 1159  Sat Jun 15, 2023  1037 Chest  x-Lum Stillinger reviewed interpreted no evidence of acute abnormality noted and radiologist interpretation concurs [DR]  1037 CBC reviewed interpreted and normal [DR]    Clinical Course User Index [DR] [name], MD                                 Medical Decision Making Amount and/or Complexity of Data Reviewed Labs: ordered. Radiology: ordered.  Risk Prescription drug management.   Patient improved with decreased symptoms now with standing.  Nausea decreased. Plan oral fluid trial 83 year old male presents today with lightheadedness after several days of nausea and some decreased p.o. intake.  Patient's abdomen examined to evaluate for acute intra-abdominal etiology.  Abdomen is soft and nontender and doubt acute surgical etiology. Patient's symptoms most consistent with volume depletion.  He was tachycardic and blood pressure was 106 systolically on standing.  Orthostatics were done and blood pressure now improved after IV fluids. Patient was given Zofran  and has decreased nausea.  He has had p.o. fluid challenge here and is tolerating p.o. without difficulty.  Symptoms are improved.  Patient advised regarding p.o. intake and return precautions and voices understanding.       Final Clinical Impression(s) / ED Diagnoses Final diagnoses:  Nausea  Dehydration    Rx / DC Orders ED  Discharge Orders          Ordered    ondansetron  (ZOFRAN -ODT) 8 MG disintegrating tablet  Every 8 hours PRN        06/15/23 1158              [name], MD 06/15/23 1159

## 2023-06-15 NOTE — ED Notes (Signed)
Pt was able to hold down the fluids wo n/v.  

## 2023-06-15 NOTE — Discharge Instructions (Addendum)
 Please use Zofran  as prescribed for nausea.  Make sure you are drinking plenty of fluids to the point that you are urinating up to every 3 hours. Return to the emergency department if you are having new or worsening symptoms Recheck with your doctor early next week

## 2023-06-18 ENCOUNTER — Telehealth (HOSPITAL_COMMUNITY): Payer: Self-pay | Admitting: Emergency Medicine

## 2023-06-18 NOTE — Telephone Encounter (Signed)
 Reaching out to patient to offer assistance regarding upcoming cardiac imaging study; pt verbalizes understanding of appt date/time, parking situation and where to check in, pre-test NPO status and medications ordered, and verified current allergies; name and call back number provided for further questions should they arise Rockwell Alexandria RN Navigator Cardiac Imaging [facility] [phone] office [phone] cell

## 2023-06-19 ENCOUNTER — Ambulatory Visit (HOSPITAL_COMMUNITY)
Admission: RE | Admit: 2023-06-19 | Discharge: 2023-06-19 | Disposition: A | Discharge status: Home / Self Care | Source: Ambulatory Visit | Attending: Physician Assistant | Admitting: Physician Assistant

## 2023-06-19 DIAGNOSIS — I25118 Atherosclerotic heart disease of native coronary artery with other forms of angina pectoris: Secondary | ICD-10-CM | POA: Diagnosis not present

## 2023-06-19 DIAGNOSIS — I10 Essential (primary) hypertension: Secondary | ICD-10-CM | POA: Diagnosis not present

## 2023-06-19 DIAGNOSIS — J432 Centrilobular emphysema: Secondary | ICD-10-CM | POA: Diagnosis not present

## 2023-06-19 LAB — NM PET CT CARDIAC PERFUSION MULTI W/ABSOLUTE BLOODFLOW
MBFR: 1.69
Nuc Rest EF: 62 %
Nuc Stress EF: 64 %
Rest MBF: 1.27 ml/g/min
Rest Nuclear Isotope Dose: 20 mCi
ST Depression (mm): 0 mm
Stress MBF: 2.14 ml/g/min
Stress Nuclear Isotope Dose: 20.1 mCi
TID: 1.09

## 2023-06-19 MED ORDER — REGADENOSON 0.4 MG/5ML IV SOLN
INTRAVENOUS | Status: AC
  Filled 2023-06-19: qty 5

## 2023-06-19 MED ORDER — REGADENOSON 0.4 MG/5ML IV SOLN
0.4000 mg | Freq: Once | INTRAVENOUS | Status: AC
  Administered 2023-06-19: 0.4 mg via INTRAVENOUS

## 2023-06-19 MED ORDER — RUBIDIUM RB82 GENERATOR (RUBYFILL)
20.0500 | PACK | Freq: Once | INTRAVENOUS | Status: AC
  Administered 2023-06-19: 20.05 via INTRAVENOUS

## 2023-06-19 MED ORDER — RUBIDIUM RB82 GENERATOR (RUBYFILL)
20.0400 | PACK | Freq: Once | INTRAVENOUS | Status: AC
  Administered 2023-06-19: 20.04 via INTRAVENOUS

## 2023-06-20 ENCOUNTER — Encounter: Payer: Self-pay | Admitting: Physician Assistant

## 2023-06-20 DIAGNOSIS — I2510 Atherosclerotic heart disease of native coronary artery without angina pectoris: Secondary | ICD-10-CM | POA: Insufficient documentation

## 2023-06-21 ENCOUNTER — Other Ambulatory Visit: Payer: Self-pay | Admitting: Student

## 2023-06-21 DIAGNOSIS — J42 Unspecified chronic bronchitis: Secondary | ICD-10-CM

## 2023-06-24 ENCOUNTER — Other Ambulatory Visit: Payer: Self-pay | Admitting: Student

## 2023-06-24 DIAGNOSIS — J449 Chronic obstructive pulmonary disease, unspecified: Secondary | ICD-10-CM

## 2023-06-26 DIAGNOSIS — G8929 Other chronic pain: Secondary | ICD-10-CM | POA: Diagnosis not present

## 2023-06-26 DIAGNOSIS — I10 Essential (primary) hypertension: Secondary | ICD-10-CM | POA: Diagnosis not present

## 2023-06-26 DIAGNOSIS — M25562 Pain in left knee: Secondary | ICD-10-CM | POA: Diagnosis not present

## 2023-06-26 DIAGNOSIS — M25561 Pain in right knee: Secondary | ICD-10-CM | POA: Diagnosis not present

## 2023-06-26 DIAGNOSIS — Z79899 Other long term (current) drug therapy: Secondary | ICD-10-CM | POA: Diagnosis not present

## 2023-06-26 DIAGNOSIS — G894 Chronic pain syndrome: Secondary | ICD-10-CM | POA: Diagnosis not present

## 2023-06-28 DIAGNOSIS — Z79899 Other long term (current) drug therapy: Secondary | ICD-10-CM | POA: Diagnosis not present

## 2023-06-29 ENCOUNTER — Other Ambulatory Visit: Payer: Self-pay | Admitting: Student

## 2023-06-29 DIAGNOSIS — J449 Chronic obstructive pulmonary disease, unspecified: Secondary | ICD-10-CM

## 2023-07-02 ENCOUNTER — Ambulatory Visit: Payer: Self-pay | Admitting: Physician Assistant

## 2023-07-09 MED ORDER — RANOLAZINE ER 500 MG PO TB12: 500.0000 mg | ORAL_TABLET | Freq: Two times a day (BID) | ORAL | 11 refills | Status: AC

## 2023-07-10 ENCOUNTER — Telehealth: Payer: Self-pay

## 2023-07-10 NOTE — Telephone Encounter (Signed)
 Received call from Merritt Ables, case manager regarding concerns with patient.   She reports that patient's home has a severe bed bug infestation and clutter issues.   They are having a difficult time keeping aides in the home due these issues.   She states that they do have resources that could help with de-cluttering, however, is concerned that patient would not be able to handle due to anxiety and frailty.   They are requesting provider input regarding treatment of home with present anxiety.   Call back number for Merritt Ables is [phone].  Elsie Halo, RN

## 2023-07-12 ENCOUNTER — Telehealth: Payer: Self-pay | Admitting: Student

## 2023-07-12 NOTE — Telephone Encounter (Signed)
 Erroneous

## 2023-07-12 NOTE — Telephone Encounter (Signed)
 Tried to get into contact with Joel Donaldson regarding bed bug infestation, no answer. Left HIPAA compliant voicemail to return call to clinic.  Dr. Jarvis Knodel

## 2023-07-15 NOTE — Telephone Encounter (Signed)
 Merritt Ables returns call to nurse line.   She reports severe bed bug infestation in the patients home. He lives there with his granddaughter with special needs.   She reports he is able to get services, however no one is willing to go into the home.   She reports she has set up a "decluttering" of the home, followed by "the bug clean up."   She reports she has concerns as patient is very anxious about his and reports he will need to leave the home for these activities to take place.   She reports the activities to restore the home will begin on 6/10.  Will forward to PCP.

## 2023-07-15 NOTE — Telephone Encounter (Signed)
 Spoke with patient. Made appt for 6/11 at 10:30. Linnie Riches, CMA

## 2023-07-18 ENCOUNTER — Other Ambulatory Visit: Payer: Self-pay | Admitting: Student

## 2023-07-18 DIAGNOSIS — J42 Unspecified chronic bronchitis: Secondary | ICD-10-CM

## 2023-07-24 ENCOUNTER — Other Ambulatory Visit (HOSPITAL_COMMUNITY)
Admission: RE | Admit: 2023-07-24 | Discharge: 2023-07-24 | Disposition: A | Discharge status: Home / Self Care | Source: Ambulatory Visit | Attending: Family Medicine | Admitting: Family Medicine

## 2023-07-24 ENCOUNTER — Ambulatory Visit (HOSPITAL_COMMUNITY)
Admission: RE | Admit: 2023-07-24 | Discharge: 2023-07-24 | Disposition: A | Discharge status: Home / Self Care | Source: Ambulatory Visit | Attending: Family Medicine | Admitting: Family Medicine

## 2023-07-24 ENCOUNTER — Encounter: Payer: Self-pay | Admitting: Student

## 2023-07-24 ENCOUNTER — Ambulatory Visit (INDEPENDENT_AMBULATORY_CARE_PROVIDER_SITE_OTHER): Admitting: Student

## 2023-07-24 VITALS — BP 109/74 | HR 91 | Ht 70.0 in | Wt 166.2 lb

## 2023-07-24 DIAGNOSIS — R079 Chest pain, unspecified: Secondary | ICD-10-CM

## 2023-07-24 NOTE — Progress Notes (Signed)
    SUBJECTIVE:   CHIEF COMPLAINT / HPI: Chest Pain  Discussed the use of AI scribe software for clinical note transcription with the patient, who gave verbal consent to proceed.  History of Present Illness He experienced left-sided chest pain radiating to his left arm, with numbness in his right hand on Monday. The pain occurred at rest and was followed by palpitations. Currently, he has no chest pain or palpitations.  On Monday, he experienced near-syncope while sitting, feeling lightheaded and 'sort of getting blank' without losing consciousness. He feels lightheaded and experiences a faster heartbeat when walking for long periods. No current shortness of breath or dizziness. States he did not hit his head.  He has constipation and is taking Percocet via pain doctor. He has MiraLAX  available but has not used it. Constipation improved yesterday, with no abdominal pain. BM today. He has a decreased appetite.  Denies any anxiety, states his house is being treated for bedbugs  PERTINENT  PMH / PSH: CAD ?microvascular disease  OBJECTIVE:   BP 109/74   Pulse 91   Ht 5' 10 (1.778 m)   Wt 166 lb 4 oz (75.4 kg)   SpO2 100%   BMI 23.85 kg/m   General: Well appearing, NAD, awake, alert, responsive to questions Head: Normocephalic atraumatic CV: Regular rate and rhythm no murmurs rubs or gallops Respiratory: Clear to ausculation bilaterally, no wheezes rales or crackles, chest rises symmetrically,  no increased work of breathing on RA  Extremities: Moves upper and lower extremities freely  ASSESSMENT/PLAN:   Assessment & Plan Chest pain, unspecified type Intermittent chest pain with exertion and at rest, raising concern for unstable angina and MI risk. Hx of CAD.Experienced a syncopal episode with preceding chest pain and lightheadedness. High risk for cardiac events due to CAD.  EKG without acute ischemia.  - Advised ED evaluation for cardiac enzymes/consider d-dimer which patient  agrees to  Constipation Ongoing constipation likely due to oxycodone  use. Improved - Encourage MiraLAX  use. - Discussed narcotic impact on bowel movements and importance of managing constipation  Genora Kidd, MD [facility]

## 2023-07-24 NOTE — Patient Instructions (Addendum)
 It was great to see you! Thank you for allowing me to participate in your care!   Our plans for today:  - Recommend ED evaluation for chest pain at rest and the episode of passing out on Monday to check your cardiac enzymes/make sure no other reason for passing out  Take care and seek immediate care sooner if you develop any concerns.  Genora Kidd, MD

## 2023-07-25 ENCOUNTER — Other Ambulatory Visit: Payer: Self-pay

## 2023-07-25 ENCOUNTER — Encounter (HOSPITAL_COMMUNITY): Payer: Self-pay | Admitting: Internal Medicine

## 2023-07-25 ENCOUNTER — Observation Stay (HOSPITAL_COMMUNITY)
Admission: EM | Admit: 2023-07-25 | Discharge: 2023-07-26 | Disposition: A | Discharge status: Home / Self Care | Attending: Internal Medicine | Admitting: Internal Medicine

## 2023-07-25 ENCOUNTER — Emergency Department (HOSPITAL_COMMUNITY)

## 2023-07-25 DIAGNOSIS — E441 Mild protein-calorie malnutrition: Secondary | ICD-10-CM | POA: Diagnosis not present

## 2023-07-25 DIAGNOSIS — Z7982 Long term (current) use of aspirin: Secondary | ICD-10-CM | POA: Diagnosis not present

## 2023-07-25 DIAGNOSIS — R531 Weakness: Secondary | ICD-10-CM | POA: Diagnosis not present

## 2023-07-25 DIAGNOSIS — R55 Syncope and collapse: Secondary | ICD-10-CM | POA: Diagnosis not present

## 2023-07-25 DIAGNOSIS — K219 Gastro-esophageal reflux disease without esophagitis: Secondary | ICD-10-CM | POA: Diagnosis not present

## 2023-07-25 DIAGNOSIS — I2510 Atherosclerotic heart disease of native coronary artery without angina pectoris: Secondary | ICD-10-CM | POA: Diagnosis not present

## 2023-07-25 DIAGNOSIS — I959 Hypotension, unspecified: Secondary | ICD-10-CM | POA: Insufficient documentation

## 2023-07-25 DIAGNOSIS — Z79899 Other long term (current) drug therapy: Secondary | ICD-10-CM | POA: Diagnosis not present

## 2023-07-25 DIAGNOSIS — E876 Hypokalemia: Secondary | ICD-10-CM | POA: Diagnosis not present

## 2023-07-25 DIAGNOSIS — Z87891 Personal history of nicotine dependence: Secondary | ICD-10-CM | POA: Insufficient documentation

## 2023-07-25 DIAGNOSIS — E785 Hyperlipidemia, unspecified: Secondary | ICD-10-CM | POA: Diagnosis present

## 2023-07-25 DIAGNOSIS — D649 Anemia, unspecified: Secondary | ICD-10-CM | POA: Diagnosis present

## 2023-07-25 DIAGNOSIS — I10 Essential (primary) hypertension: Secondary | ICD-10-CM | POA: Diagnosis not present

## 2023-07-25 DIAGNOSIS — M170 Bilateral primary osteoarthritis of knee: Secondary | ICD-10-CM | POA: Diagnosis not present

## 2023-07-25 DIAGNOSIS — J449 Chronic obstructive pulmonary disease, unspecified: Secondary | ICD-10-CM | POA: Diagnosis present

## 2023-07-25 DIAGNOSIS — E861 Hypovolemia: Secondary | ICD-10-CM | POA: Diagnosis not present

## 2023-07-25 DIAGNOSIS — Z8546 Personal history of malignant neoplasm of prostate: Secondary | ICD-10-CM | POA: Insufficient documentation

## 2023-07-25 DIAGNOSIS — R404 Transient alteration of awareness: Secondary | ICD-10-CM | POA: Diagnosis not present

## 2023-07-25 DIAGNOSIS — R42 Dizziness and giddiness: Secondary | ICD-10-CM | POA: Diagnosis not present

## 2023-07-25 DIAGNOSIS — R6889 Other general symptoms and signs: Secondary | ICD-10-CM | POA: Diagnosis not present

## 2023-07-25 LAB — CBC
HCT: 37.4 % — ABNORMAL LOW (ref 39.0–52.0)
Hemoglobin: 11.9 g/dL — ABNORMAL LOW (ref 13.0–17.0)
MCH: 28.5 pg (ref 26.0–34.0)
MCHC: 31.8 g/dL (ref 30.0–36.0)
MCV: 89.5 fL (ref 80.0–100.0)
Platelets: 193 10*3/uL (ref 150–400)
RBC: 4.18 MIL/uL — ABNORMAL LOW (ref 4.22–5.81)
RDW: 14.6 % (ref 11.5–15.5)
WBC: 6.3 10*3/uL (ref 4.0–10.5)
nRBC: 0 % (ref 0.0–0.2)

## 2023-07-25 LAB — COMPREHENSIVE METABOLIC PANEL WITH GFR
ALT: 16 U/L (ref 0–44)
AST: 19 U/L (ref 15–41)
Albumin: 3.4 g/dL — ABNORMAL LOW (ref 3.5–5.0)
Alkaline Phosphatase: 53 U/L (ref 38–126)
Anion gap: 10 (ref 5–15)
BUN: 15 mg/dL (ref 8–23)
CO2: 22 mmol/L (ref 22–32)
Calcium: 8.2 mg/dL — ABNORMAL LOW (ref 8.9–10.3)
Chloride: 106 mmol/L (ref 98–111)
Creatinine, Ser: 0.87 mg/dL (ref 0.61–1.24)
GFR, Estimated: >60 mL/min (ref >60)
Glucose, Bld: 96 mg/dL (ref 70–99)
Potassium: 3.2 mmol/L — ABNORMAL LOW (ref 3.5–5.1)
Sodium: 138 mmol/L (ref 135–145)
Total Bilirubin: 1 mg/dL (ref 0.0–1.2)
Total Protein: 6.1 g/dL — ABNORMAL LOW (ref 6.5–8.1)

## 2023-07-25 LAB — TROPONIN I (HIGH SENSITIVITY)
Troponin I (High Sensitivity): 6 ng/L (ref <18)
Troponin I (High Sensitivity): 5 ng/L (ref <18)

## 2023-07-25 LAB — MAGNESIUM: Magnesium: 1.6 mg/dL — ABNORMAL LOW (ref 1.7–2.4)

## 2023-07-25 MED ORDER — BUPRENORPHINE 10 MCG/HR TD PTWK
1.0000 | MEDICATED_PATCH | TRANSDERMAL | Status: DC
  Administered 2023-07-26: 1 via TRANSDERMAL
  Filled 2023-07-25: qty 1

## 2023-07-25 MED ORDER — RANOLAZINE ER 500 MG PO TB12
500.0000 mg | ORAL_TABLET | Freq: Two times a day (BID) | ORAL | Status: DC
  Administered 2023-07-25 – 2023-07-26 (×2): 500 mg via ORAL
  Filled 2023-07-25 (×4): qty 1

## 2023-07-25 MED ORDER — UMECLIDINIUM BROMIDE 62.5 MCG/ACT IN AEPB
1.0000 | INHALATION_SPRAY | Freq: Every day | RESPIRATORY_TRACT | Status: DC
  Administered 2023-07-26: 1 via RESPIRATORY_TRACT
  Filled 2023-07-25: qty 7

## 2023-07-25 MED ORDER — SODIUM CHLORIDE 0.9% FLUSH
3.0000 mL | Freq: Two times a day (BID) | INTRAVENOUS | Status: DC
  Administered 2023-07-25 – 2023-07-26 (×2): 3 mL via INTRAVENOUS

## 2023-07-25 MED ORDER — ACETAMINOPHEN 325 MG PO TABS
650.0000 mg | ORAL_TABLET | Freq: Four times a day (QID) | ORAL | Status: DC | PRN
Start: 2023-07-25 — End: 2023-07-26

## 2023-07-25 MED ORDER — LACTATED RINGERS IV BOLUS
1000.0000 mL | Freq: Once | INTRAVENOUS | Status: AC
  Administered 2023-07-25: 1000 mL via INTRAVENOUS

## 2023-07-25 MED ORDER — PROCHLORPERAZINE EDISYLATE 10 MG/2ML IJ SOLN: 7.5000 mg | Freq: Four times a day (QID) | INTRAMUSCULAR | Status: DC | PRN

## 2023-07-25 MED ORDER — ONDANSETRON HCL 4 MG/2ML IJ SOLN: 4.0000 mg | Freq: Four times a day (QID) | INTRAMUSCULAR | Status: DC | PRN

## 2023-07-25 MED ORDER — ASPIRIN 81 MG PO TBEC
81.0000 mg | DELAYED_RELEASE_TABLET | Freq: Every day | ORAL | Status: DC
  Administered 2023-07-26: 81 mg via ORAL
  Filled 2023-07-25: qty 1

## 2023-07-25 MED ORDER — ARFORMOTEROL TARTRATE 15 MCG/2ML IN NEBU
15.0000 ug | INHALATION_SOLUTION | Freq: Two times a day (BID) | RESPIRATORY_TRACT | Status: DC
  Administered 2023-07-26: 15 ug via RESPIRATORY_TRACT
  Filled 2023-07-25: qty 2

## 2023-07-25 MED ORDER — LACTATED RINGERS IV SOLN: INTRAVENOUS | Status: DC

## 2023-07-25 MED ORDER — ACETAMINOPHEN 650 MG RE SUPP: 650.0000 mg | Freq: Four times a day (QID) | RECTAL | Status: DC | PRN

## 2023-07-25 MED ORDER — LIDOCAINE 4 % EX CREA: 1.0000 | TOPICAL_CREAM | Freq: Every day | CUTANEOUS | Status: DC | PRN

## 2023-07-25 MED ORDER — ONDANSETRON HCL 4 MG PO TABS: 4.0000 mg | ORAL_TABLET | Freq: Four times a day (QID) | ORAL | Status: DC | PRN

## 2023-07-25 MED ORDER — OXYCODONE HCL 5 MG PO TABS
10.0000 mg | ORAL_TABLET | Freq: Four times a day (QID) | ORAL | Status: DC | PRN
  Administered 2023-07-26: 10 mg via ORAL
  Filled 2023-07-25: qty 2

## 2023-07-25 MED ORDER — ENOXAPARIN SODIUM 40 MG/0.4ML IJ SOSY: 40.0000 mg | PREFILLED_SYRINGE | INTRAMUSCULAR | Status: DC

## 2023-07-25 MED ORDER — ALBUTEROL SULFATE (2.5 MG/3ML) 0.083% IN NEBU: 3.0000 mL | INHALATION_SOLUTION | RESPIRATORY_TRACT | Status: DC | PRN

## 2023-07-25 MED ORDER — POTASSIUM CHLORIDE CRYS ER 20 MEQ PO TBCR
40.0000 meq | EXTENDED_RELEASE_TABLET | Freq: Once | ORAL | Status: AC
  Administered 2023-07-25: 40 meq via ORAL
  Filled 2023-07-25: qty 2

## 2023-07-25 MED ORDER — MAGNESIUM SULFATE 2 GM/50ML IV SOLN
2.0000 g | Freq: Once | INTRAVENOUS | Status: AC
  Administered 2023-07-25: 2 g via INTRAVENOUS
  Filled 2023-07-25: qty 50

## 2023-07-25 MED ORDER — LORATADINE 10 MG PO TABS
10.0000 mg | ORAL_TABLET | Freq: Every day | ORAL | Status: DC
  Administered 2023-07-26: 10 mg via ORAL
  Filled 2023-07-25: qty 1

## 2023-07-25 MED ORDER — PANTOPRAZOLE SODIUM 40 MG PO TBEC
40.0000 mg | DELAYED_RELEASE_TABLET | Freq: Every day | ORAL | Status: DC
  Administered 2023-07-26: 40 mg via ORAL
  Filled 2023-07-25: qty 1

## 2023-07-25 MED ORDER — ATORVASTATIN CALCIUM 20 MG PO TABS
20.0000 mg | ORAL_TABLET | Freq: Every day | ORAL | Status: DC
  Administered 2023-07-26: 20 mg via ORAL
  Filled 2023-07-25: qty 1

## 2023-07-25 NOTE — ED Triage Notes (Signed)
 Pt brought from home for general weakness and hypotension. Pt states that he started feeling dizzy and weak starting 2 days ago. Pt is positive for orthostatic hypotension. Pt is alert and oriented x4. of LR given en route to ED.

## 2023-07-25 NOTE — H&P (Signed)
 History and Physical    Patient: Joel Donaldson ZOX:[id number] DOB: Jul 01, 1940 DOA: 07/25/2023 DOS: the patient was seen and examined on 07/25/2023 PCP: Genora Kidd, MD  Patient coming from: Home  Chief Complaint:  Chief Complaint  Patient presents with   Hypotension   HPI: Joel Donaldson is a 83 y.o. male with medical history significant of headache, alcohol abuse, anxiety, depression, osteoarthritis, asthma, class III angina, right knee Baker's cyst, epigastric pain, fall, GERD, glaucoma, hypertension, prostate cancer treated with seed implants, rheumatoid arthritis, history of syncopal episode who was brought to the emergency department due to hypotension. Per patient, he was outside and he felt that he was very hot. He felt lightheaded, mildly diaphoretic and mildly nauseous. No chest pain, palpitations, PND, orthopnea or recent edema of the lower extremities. He denied fever, chills, rhinorrhea, sore throat, wheezing or hemoptysis. No abdominal pain, nausea, emesis, diarrhea, constipation, melena or hematochezia. No flank pain, dysuria, frequency or hematuria.  No polyuria, polydipsia, polyphagia or blurred vision.   Lab work: CBC showed white count 6.3, hemoglobin 11.9 g/dL platelets 811.  Troponin level was normal.  CMP showed a potassium 3.2 mmol/L, corrected calcium  calcium  8.7 mg/dL, total protein 6.1 and albumin 3.4 g/dL.  The rest of the electrolytes, the rest of the LFTs and renal function were normal.  Imaging: 2 view chest radiograph with no active cardiopulmonary disease.   ED course: Initial vital signs were temperature 99.6 F, pulse 84, respirations 16, BP 130/79 mmHg O2 sat 99% on room air.  The patient received LR 1000 mL bolus.  Review of Systems: As mentioned in the history of present illness. All other systems reviewed and are negative.  Past Medical History:  Diagnosis Date   Acute nonintractable headache 05/31/2021   Alcohol abuse    Anxiety    Arthritis     hands, back, knees   Asthma    Atypical angina (HCC) (Class III) 12/19/2011   Baker's cyst of knee, right 06/21/2016   Chest pain at rest 10/04/2016   Depression    Epigastric pain 05/20/2020   Fall 10/23/2021   GERD (gastroesophageal reflux disease)    Glaucoma    Heart murmur asymptomatic   Hx of radioisotope therapy 07/26/2011   Radiosactive Prostate Seed Implant/ I-125 Seeds   Hypertension    Neck pain 10/22/2019   Prostate cancer (HCC) dx'd 2016 or 2017   seed implants   Rheumatoid arthritis (HCC) 10/10/2012   Past Surgical History:  Procedure Laterality Date   CYSTOSCOPY  09/14/2011   Procedure: CYSTOSCOPY FLEXIBLE;  Surgeon: Jinny Mounts, MD;  Location: [facility];  Service: Urology;  Laterality: N/A;  No seeds seen in the bladder   LEFT HEART CATH AND CORONARY ANGIOGRAPHY N/A 09/16/2020   Procedure: LEFT HEART CATH AND CORONARY ANGIOGRAPHY;  Surgeon: Arleen Lacer, MD;  Location: [facility] INVASIVE CV LAB;  Service: Cardiovascular;  Laterality: N/A;   LUMBAR FUSION  1995   RADIOACTIVE SEED IMPLANT  09/14/2011   Procedure: RADIOACTIVE SEED IMPLANT;  Surgeon: Jinny Mounts, MD;  Location[facility];  Service: Urology;  Laterality: N/A;  Total number of seeds -     SHOULDER SURGERY  YRS AGO   LEFT   Social History:  reports that he quit smoking about 18 years ago. His smoking use included cigarettes. He started smoking about 70 years ago. He has a 103.3 pack-year smoking history. He has never used smokeless tobacco. He reports that he does not currently  use alcohol after a past usage of about 14.0 standard drinks of alcohol per week. He reports that he does not use drugs.  Allergies  Allergen Reactions   Tramadol  Nausea Only   Pollen Extract Other (See Comments)    Sneezing    Family History  Problem Relation Age of Onset   Diabetes Mother    Heart disease Father        lived to 7 - had bypass, pacemaker    Prior to Admission  medications   Medication Sig Start Date End Date Taking? Authorizing Provider  acetaminophen  (TYLENOL ) 500 MG tablet Take 2 tablets (1,000 mg total) by mouth every 6 (six) hours as needed. 10/14/21   Wynetta Heckle, MD  albuterol  (VENTOLIN  HFA) 108 (90 Base) MCG/ACT inhaler INHALE 1 TO 2 PUFFS INTO THE LUNGS EVERY 4 HOURS AS NEEDED FOR WHEEZING OR SHORTNESS OF BREATH 06/25/23   Genora Kidd, MD  aspirin  EC 81 MG tablet Take 81 mg by mouth every morning.     [provider]  atorvastatin  (LIPITOR) 20 MG tablet TAKE 1 TABLET(20 MG) BY MOUTH DAILY 03/14/23   Croitoru, Mihai, MD  buprenorphine  (BUTRANS ) 10 MCG/HR PTWK Place 1 patch onto the skin every Thursday. 04/29/23   [provider]  diclofenac  Sodium (VOLTAREN ) 1 % GEL Apply 4 g topically 4 (four) times daily. 01/10/21   Brimage, Vondra, DO  fluticasone  (FLONASE ) 50 MCG/ACT nasal spray Place 2 sprays into both nostrils daily. 05/30/23   Genora Kidd, MD  furosemide  (LASIX ) 40 MG tablet Take 1 tablet (40 mg total) by mouth daily as needed for edema. 05/22/23   [name], MD  isosorbide  mononitrate (IMDUR ) 30 MG 24 hr tablet Take 0.5 tablets (15 mg total) by mouth daily. 06/11/23 09/09/23  [name], PA-C  lidocaine  (XYLOCAINE ) 5 % ointment Apply 1 application topically daily as needed for mild pain or moderate pain. 01/10/21   Brimage, Vondra, DO  loratadine  (CLARITIN ) 10 MG tablet Take 1 tablet (10 mg total) by mouth daily. 05/30/23   Genora Kidd, MD  Multiple Vitamins-Minerals (ONE-A-DAY PROACTIVE 65+) TABS Take 1 tablet by mouth daily with breakfast.    [provider]  nitroGLYCERIN  (NITROSTAT ) 0.4 MG SL tablet Place 1 tablet (0.4 mg total) under the tongue every 5 (five) minutes as needed for chest pain (chest pain). 05/25/21   Brimage, Vondra, DO  omeprazole  (PRILOSEC) 40 MG capsule TAKE 1 CAPSULE(40 MG) BY MOUTH DAILY BEFORE BREAKFAST 06/12/23   Genora Kidd, MD  ondansetron  (ZOFRAN -ODT) 8 MG  disintegrating tablet Take 1 tablet (8 mg total) by mouth every 8 (eight) hours as needed for nausea or vomiting. 06/15/23   [name], MD  oxyCODONE -acetaminophen  (PERCOCET) 10-325 MG tablet Take 1 tablet by mouth 4 (four) times daily as needed for pain. 02/26/22   [provider]  polyethylene glycol (MIRALAX  / GLYCOLAX ) 17 g packet Take 17 g by mouth daily as needed for mild constipation. 01/10/21   Brimage, Vondra, DO  ranolazine  (RANEXA ) 500 MG 12 hr tablet Take 1 tablet (500 mg total) by mouth 2 (two) times daily. 07/09/23   [name], PA-C  Tiotropium Bromide -Olodaterol (STIOLTO RESPIMAT ) 2.5-2.5 MCG/ACT AERS INHALE 2 PUFFS INTO THE LUNGS DAILY 07/19/23   Genora Kidd, MD    Physical Exam: Vitals:   07/25/23 1254  BP: 130/79  Pulse: 84  Resp: 16  Temp: 99.6 F (37.6 C)  TempSrc: Oral  SpO2: 99%  Weight: 75 kg  Height: 5' 10 (1.778  m)   Physical Exam Vitals and nursing note reviewed.  Constitutional:      General: He is awake. He is not in acute distress. HENT:     Head: Normocephalic.     Nose: No rhinorrhea.     Mouth/Throat:     Mouth: Mucous membranes are moist.   Eyes:     General: No scleral icterus.    Pupils: Pupils are equal, round, and reactive to light.   Neck:     Vascular: No carotid bruit or JVD.   Cardiovascular:     Rate and Rhythm: Normal rate and regular rhythm.     Heart sounds: S1 normal and S2 normal.  Pulmonary:     Effort: Pulmonary effort is normal.     Breath sounds: Normal breath sounds. No wheezing, rhonchi or rales.  Abdominal:     General: There is no distension.     Palpations: Abdomen is soft.     Tenderness: There is no guarding.   Musculoskeletal:     Cervical back: Neck supple.     Right lower leg: No edema.     Left lower leg: No edema.   Skin:    General: Skin is warm and dry.   Neurological:     General: No focal deficit present.     Mental Status: He is alert and oriented to person, place, and  time.   Psychiatric:        Mood and Affect: Mood normal.        Behavior: Behavior normal. Behavior is cooperative.     Data Reviewed:  Results are pending, will review when available.  EKG: Vent. rate 88 BPM PR interval 185 ms QRS duration 97 ms QT/QTcB 402/487 ms P-R-T axes 53 -15 52 Sinus rhythm Atrial premature complex Borderline left axis deviation Borderline prolonged QT interval  Assessment and Plan: Principal Problem:   Syncope Observation/telemetry. Continue IV fluids. Hold antihypertensives today. Correct electrolyte abnormality. Check carotid Doppler. Check echocardiogram.  Active Problems:   COPD (chronic obstructive pulmonary disease) (HCC) Continue maintenance inhaler. Continue albuterol  inhaler as needed.    Hyperlipidemia Continue atorvastatin  40 mg p.o. daily.    GASTROESOPHAGEAL REFLUX, NO ESOPHAGITIS Continue omeprazole  or formulary equivalent.    HTN (hypertension) Hold the hypertensives today.    CAD (coronary artery disease) Continue atorvastatin , ranolazine  and aspirin .    Normocytic anemia Monitor hematocrit and hemoglobin.    Hypokalemia   Mild protein malnutrition (HCC) In the setting of anemia and chronic illness May benefit from protein supplementation. Consider nutritional services evaluation. Follow-up albumin level.    Hypocalcemia Recheck calcium  level in AM. Further workup depending on results.     Advance Care Planning:   Code Status: Limited: Do not attempt resuscitation (DNR) -DNR-LIMITED -Do Not Intubate/DNI    Consults:   Family Communication:   Severity of Illness: The appropriate patient status for this patient is INPATIENT. Inpatient status is judged to be reasonable and necessary in order to provide the required intensity of service to ensure the patient's safety. The patient's presenting symptoms, physical exam findings, and initial radiographic and laboratory data in the context of their chronic  comorbidities is felt to place them at high risk for further clinical deterioration. Furthermore, it is not anticipated that the patient will be medically stable for discharge from the hospital within 2 midnights of admission.   * I certify that at the point of admission it is my clinical judgment that the patient will require inpatient hospital  care spanning beyond 2 midnights from the point of admission due to high intensity of service, high risk for further deterioration and high frequency of surveillance required.*  Author: Danice Dural, MD 07/25/2023 2:59 PM  For on call review [email or web address].   This document was prepared using Dragon voice recognition software and may contain some unintended transcription errors.

## 2023-07-25 NOTE — ED Notes (Signed)
 Pt reports feeling dizzy while standing for orthostatic vitals

## 2023-07-25 NOTE — ED Provider Notes (Signed)
 [place] EMERGENCY DEPARTMENT AT [facility] Provider Note   CSN: [id number] Arrival date & time: 07/25/23  1245     Patient presents with: Hypotension   Joel Donaldson is a 83 y.o. male.   Presenting emergency department after syncopal episode yesterday into near syncopal episode today.  Reports symptoms seemingly with exertion.  Was walking around yesterday when he send became diaphoretic, went and sat down in chair, felt lightheaded and then passed out.  Did not fall out of the chair.  No trauma.  Today had another episode of near syncope where he was in the yard walking, bent down to pick object up, and again became lightheaded, sat down and symptoms subsided.  Went to primary doctor for knee injections and after coming home reported was again walking in house and had similar symptoms of lightheadedness.  Reports feeling improved currently not having any chest pain.  Denies any palpitations.  Low risk for PE based on Wells criteria        Prior to Admission medications   Medication Sig Start Date End Date Taking? Authorizing Provider  acetaminophen  (TYLENOL ) 500 MG tablet Take 2 tablets (1,000 mg total) by mouth every 6 (six) hours as needed. 10/14/21   Wynetta Heckle, MD  albuterol  (VENTOLIN  HFA) 108 (90 Base) MCG/ACT inhaler INHALE 1 TO 2 PUFFS INTO THE LUNGS EVERY 4 HOURS AS NEEDED FOR WHEEZING OR SHORTNESS OF BREATH 06/25/23   Genora Kidd, MD  aspirin  EC 81 MG tablet Take 81 mg by mouth every morning.     [provider]  atorvastatin  (LIPITOR) 20 MG tablet TAKE 1 TABLET(20 MG) BY MOUTH DAILY 03/14/23   Croitoru, Mihai, MD  buprenorphine  (BUTRANS ) 10 MCG/HR PTWK Place 1 patch onto the skin every Thursday. 04/29/23   [provider]  diclofenac  Sodium (VOLTAREN ) 1 % GEL Apply 4 g topically 4 (four) times daily. 01/10/21   Brimage, Vondra, DO  fluticasone  (FLONASE ) 50 MCG/ACT nasal spray Place 2 sprays into both nostrils daily. 05/30/23   Genora Kidd, MD  furosemide  (LASIX ) 40 MG tablet Take 1 tablet (40 mg total) by mouth daily as needed for edema. 05/22/23   [name], MD  isosorbide  mononitrate (IMDUR ) 30 MG 24 hr tablet Take 0.5 tablets (15 mg total) by mouth daily. 06/11/23 09/09/23  [name], PA-C  lidocaine  (XYLOCAINE ) 5 % ointment Apply 1 application topically daily as needed for mild pain or moderate pain. 01/10/21   Brimage, Vondra, DO  loratadine  (CLARITIN ) 10 MG tablet Take 1 tablet (10 mg total) by mouth daily. 05/30/23   Genora Kidd, MD  Multiple Vitamins-Minerals (ONE-A-DAY PROACTIVE 65+) TABS Take 1 tablet by mouth daily with breakfast.    [provider]  nitroGLYCERIN  (NITROSTAT ) 0.4 MG SL tablet Place 1 tablet (0.4 mg total) under the tongue every 5 (five) minutes as needed for chest pain (chest pain). 05/25/21   Brimage, Vondra, DO  omeprazole  (PRILOSEC) 40 MG capsule TAKE 1 CAPSULE(40 MG) BY MOUTH DAILY BEFORE BREAKFAST 06/12/23   Genora Kidd, MD  ondansetron  (ZOFRAN -ODT) 8 MG disintegrating tablet Take 1 tablet (8 mg total) by mouth every 8 (eight) hours as needed for nausea or vomiting. 06/15/23   [name], MD  oxyCODONE -acetaminophen  (PERCOCET) 10-325 MG tablet Take 1 tablet by mouth 4 (four) times daily as needed for pain. 02/26/22   [provider]  polyethylene glycol (MIRALAX  / GLYCOLAX ) 17 g packet Take 17 g by mouth daily as needed for mild constipation. 01/10/21  Brimage, Vondra, DO  ranolazine  (RANEXA ) 500 MG 12 hr tablet Take 1 tablet (500 mg total) by mouth 2 (two) times daily. 07/09/23   [name], PA-C  Tiotropium Bromide -Olodaterol (STIOLTO RESPIMAT ) 2.5-2.5 MCG/ACT AERS INHALE 2 PUFFS INTO THE LUNGS DAILY 07/19/23   Genora Kidd, MD    Allergies: Tramadol  and Pollen extract    Review of Systems  Updated Vital Signs BP 130/79 (BP Location: Right Arm) Comment: Simultaneous filing. User may not have seen previous data.  Pulse 84 Comment: Simultaneous filing.  User may not have seen previous data.  Temp 99.6 F (37.6 C) (Oral) Comment: Simultaneous filing. User may not have seen previous data. Comment (Src): Simultaneous filing. User may not have seen previous data.  Resp 16 Comment: Simultaneous filing. User may not have seen previous data.  Ht 5' 10 (1.778 m)   Wt 75 kg   SpO2 99% Comment: Simultaneous filing. User may not have seen previous data.  BMI 23.72 kg/m   Physical Exam Vitals and nursing note reviewed.  Constitutional:      General: He is not in acute distress. HENT:     Head: Normocephalic.     Nose: Nose normal.   Eyes:     Conjunctiva/sclera: Conjunctivae normal.    Cardiovascular:     Rate and Rhythm: Normal rate and regular rhythm.     Pulses: Normal pulses.  Pulmonary:     Effort: Pulmonary effort is normal.     Breath sounds: Normal breath sounds.  Abdominal:     General: Abdomen is flat. There is no distension.     Palpations: Abdomen is soft.     Tenderness: There is no abdominal tenderness. There is no guarding or rebound.   Musculoskeletal:        General: Normal range of motion.     Cervical back: Normal range of motion.   Skin:    General: Skin is warm and dry.     Capillary Refill: Capillary refill takes less than 2 seconds.   Neurological:     General: No focal deficit present.     Mental Status: He is alert and oriented to person, place, and time. Mental status is at baseline.   Psychiatric:        Mood and Affect: Mood normal.        Behavior: Behavior normal.     (all labs ordered are listed, but only abnormal results are displayed) Labs Reviewed  CBC - Abnormal; Notable for the following components:      Result Value   RBC 4.18 (*)    Hemoglobin 11.9 (*)    HCT 37.4 (*)    All other components within normal limits  COMPREHENSIVE METABOLIC PANEL WITH GFR - Abnormal; Notable for the following components:   Potassium 3.2 (*)    Calcium  8.2 (*)    Total Protein 6.1 (*)    Albumin  3.4 (*)    All other components within normal limits  TROPONIN I (HIGH SENSITIVITY)  TROPONIN I (HIGH SENSITIVITY)    EKG: None  Radiology: DG Chest 2 View Result Date: 07/25/2023 CLINICAL DATA:  Syncope. EXAM: CHEST - 2 VIEW COMPARISON:  Jun 15, 2023. FINDINGS: The heart size and mediastinal contours are within normal limits. Both lungs are clear. The visualized skeletal structures are unremarkable. IMPRESSION: No active cardiopulmonary disease. Electronically Signed   By: Rosalene Colon M.D.   On: 07/25/2023 14:06     Procedures   Medications Ordered in  the ED  lactated ringers  bolus 1,000 mL (1,000 mLs Intravenous New Bag/Given 07/25/23 1523)    Clinical Course as of 07/25/23 1526  Thu Jul 25, 2023  1307 Echo 09/28/22: IMPRESSIONS     1. Left ventricular ejection fraction, by estimation, is 55%. The left  ventricle has normal function. The left ventricle demonstrates regional  wall motion abnormalities with possible basal inferolateral hypokinesis.  Left ventricular diastolic parameters   are consistent with Grade I diastolic dysfunction (impaired relaxation).   2. Right ventricular systolic function is normal. The right ventricular  size is normal. There is mildly elevated pulmonary artery systolic  pressure. The estimated right ventricular systolic pressure is 43.0 mmHg.   3. The mitral valve is normal in structure. No evidence of mitral valve  regurgitation.   4. The aortic valve is tricuspid. There is mild calcification of the  aortic valve. Aortic valve regurgitation is trivial. No aortic stenosis is  present.   5. The inferior vena cava is normal in size with <50% respiratory  variability, suggesting right atrial pressure of 8 mmHg.   [TY]    Clinical Course User Index [TY] Rolinda Climes, DO                                 Medical Decision Making This is an 83 year old male complex past medical history to include CAD, hypertension, emphysema, arthritis and  history of prostate cancer.  Presenting emergency department after syncopal episode and several near syncopal episodes.  Symptoms seemingly worsened with exertion.  EMS reported positive orthostasis and gave 750 mL of fluids.  Patient is feeling improved.  He is normotensive, afebrile, appears to be normal sinus rhythm on the monitor maintaining oxygen saturation on room air.  Will get screening cardiac labs, chest x-ray and EKG.  Patient does have some advanced age and risk factors.  His last echo with normal EF, there was some diastolic dysfunction.  Patient may benefit from inpatient admission and further cardiac workup for his syncope/near syncope as it seemingly is an exertional component to them and has 3 separate episodes in the past 24 hours.   Amount and/or Complexity of Data Reviewed External Data Reviewed:     Details: See ED course. Labs: ordered.    Details: He is orthostatic positive.  IV fluids ordered.  Mild anemia, troponin negative.  No AKI or metabolic derangements. Radiology: ordered and independent interpretation performed.    Details: No pneumonia pneumothorax ECG/medicine tests: ordered and independent interpretation performed.    Details: Appears to be sinus rhythm, no ST segment changes to indicate ischemia  Risk Decision regarding hospitalization.       Final diagnoses:  None    ED Discharge Orders     None          Rolinda Climes, DO 07/25/23 1526

## 2023-07-26 ENCOUNTER — Observation Stay (HOSPITAL_BASED_OUTPATIENT_CLINIC_OR_DEPARTMENT_OTHER)

## 2023-07-26 ENCOUNTER — Encounter (HOSPITAL_COMMUNITY): Payer: Self-pay | Admitting: Internal Medicine

## 2023-07-26 DIAGNOSIS — R55 Syncope and collapse: Secondary | ICD-10-CM

## 2023-07-26 DIAGNOSIS — E861 Hypovolemia: Secondary | ICD-10-CM | POA: Insufficient documentation

## 2023-07-26 LAB — BASIC METABOLIC PANEL WITH GFR
Anion gap: 10 (ref 5–15)
BUN: 9 mg/dL (ref 8–23)
CO2: 23 mmol/L (ref 22–32)
Calcium: 8.8 mg/dL — ABNORMAL LOW (ref 8.9–10.3)
Chloride: 103 mmol/L (ref 98–111)
Creatinine, Ser: 0.68 mg/dL (ref 0.61–1.24)
GFR, Estimated: >60 mL/min (ref >60)
Glucose, Bld: 132 mg/dL — ABNORMAL HIGH (ref 70–99)
Potassium: 3.5 mmol/L (ref 3.5–5.1)
Sodium: 136 mmol/L (ref 135–145)

## 2023-07-26 LAB — ECHOCARDIOGRAM COMPLETE
Area-P 1/2: 3.27 cm2
Height: 70 in
S' Lateral: 2.9 cm
Weight: 2592 [oz_av]

## 2023-07-26 LAB — GLUCOSE, CAPILLARY: Glucose-Capillary: 110 mg/dL — ABNORMAL HIGH (ref 70–99)

## 2023-07-26 LAB — MAGNESIUM: Magnesium: 2.1 mg/dL (ref 1.7–2.4)

## 2023-07-26 NOTE — Care Management Obs Status (Signed)
 MEDICARE OBSERVATION STATUS NOTIFICATION   Patient Details  Name: Joel Donaldson MRN: [id number] Date of Birth: Jan 14, 1941   Medicare Observation Status Notification Given:  Yes    Zenon Hilda, LCSW 07/26/2023, 3:41 PM

## 2023-07-26 NOTE — Discharge Summary (Addendum)
 Physician Discharge Summary  Joel Donaldson GNF:[id number] DOB: 01-28-1941 DOA: 07/25/2023  PCP: Genora Kidd, MD  Admit date: 07/25/2023 Discharge date: 07/26/2023  Admitted From: Home Disposition: Home  Recommendations for Outpatient Follow-up:  Follow up with PCP  Discharge Condition: Stable CODE STATUS: DNR Diet recommendation:  Diet Orders (From admission, onward)     Start     Ordered   07/26/23 0000  Diet - low sodium heart healthy        07/26/23 1442   07/25/23 1558  Diet Heart Room service appropriate? Yes; Fluid consistency: Thin  Diet effective now       Question Answer Comment  Room service appropriate? Yes   Fluid consistency: Thin      07/25/23 1558           Brief/Interim Summary:  HPI: IMAAD Joel Donaldson is a 83 y.o. male with medical history significant of headache, alcohol abuse, anxiety, depression, osteoarthritis, asthma, class III angina, right knee Baker's cyst, epigastric pain, fall, GERD, glaucoma, hypertension, prostate cancer treated with seed implants, rheumatoid arthritis, history of syncopal episode who was brought to the emergency department due to hypotension. Per patient, he was outside and he felt that he was very hot. He felt lightheaded, mildly diaphoretic and mildly nauseous. No chest pain, palpitations, PND, orthopnea or recent edema of the lower extremities. He denied fever, chills, rhinorrhea, sore throat, wheezing or hemoptysis. No abdominal pain, nausea, emesis, diarrhea, constipation, melena or hematochezia. No flank pain, dysuria, frequency or hematuria.  No polyuria, polydipsia, polyphagia or blurred vision.    Lab work: CBC showed white count 6.3, hemoglobin 11.9 g/dL platelets 846.  Troponin level was normal.  CMP showed a potassium 3.2 mmol/L, corrected calcium  calcium  8.7 mg/dL, total protein 6.1 and albumin 3.4 g/dL.  The rest of the electrolytes, the rest of the LFTs and renal function were normal.   Imaging: 2 view chest  radiograph with no active cardiopulmonary disease.   ED course: Initial vital signs were temperature 99.6 F, pulse 84, respirations 16, BP 130/79 mmHg O2 sat 99% on room air.  The patient received LR 1000 mL bolus.  [place]: Patient reported taking his oral Lasix  daily instead of as needed.  Had no further symptoms, lightheadedness or dizziness.  Echocardiogram was negative for aortic stenosis.  Carotid ultrasound was unremarkable.  Orthostatic vital sign remained negative.  On day of discharge, patient was feeling well.  He was encouraged to resume his Lasix  on as-needed basis for swelling and weight gain.  Discharge Diagnoses:   Principal Problem:   Syncope Active Problems:   COPD (chronic obstructive pulmonary disease) (HCC)   Hyperlipidemia   GASTROESOPHAGEAL REFLUX, NO ESOPHAGITIS   HTN (hypertension)   CAD (coronary artery disease)   Normocytic anemia   Hypokalemia   Mild protein malnutrition (HCC)   Hypocalcemia   Hypovolemia   Discharge Instructions  Discharge Instructions     (HEART FAILURE PATIENTS) Call MD:  Anytime you have any of the following symptoms: 1) 3 pound weight gain in 24 hours or 5 pounds in 1 week 2) shortness of breath, with or without a dry hacking cough 3) swelling in the hands, feet or stomach 4) if you have to sleep on extra pillows at night in order to breathe.   Complete by: As directed    Call MD for:  difficulty breathing, headache or visual disturbances   Complete by: As directed    Call MD for:  extreme fatigue   Complete by:  As directed    Call MD for:  persistant dizziness or light-headedness   Complete by: As directed    Call MD for:  persistant nausea and vomiting   Complete by: As directed    Call MD for:  severe uncontrolled pain   Complete by: As directed    Call MD for:  temperature >100.4   Complete by: As directed    Diet - low sodium heart healthy   Complete by: As directed    Discharge instructions   Complete by: As directed     TAKE LASIX  AS NEEDED ONCE DAILY FOR LEG SWELLING OR WEIGHT GAIN 3LB OVERNIGHT OR 5LB IN A WEEK.   Increase activity slowly   Complete by: As directed       Allergies as of 07/26/2023       Reactions   Tramadol  Nausea Only   Pollen Extract Other (See Comments)   Sneezing        Medication List     TAKE these medications    albuterol  108 (90 Base) MCG/ACT inhaler Commonly known as: VENTOLIN  HFA INHALE 1 TO 2 PUFFS INTO THE LUNGS EVERY 4 HOURS AS NEEDED FOR WHEEZING OR SHORTNESS OF BREATH   aspirin  EC 81 MG tablet Take 81 mg by mouth every morning.   atorvastatin  20 MG tablet Commonly known as: LIPITOR TAKE 1 TABLET(20 MG) BY MOUTH DAILY   buprenorphine  10 MCG/HR Ptwk Commonly known as: BUTRANS  Place 1 patch onto the skin every Friday.   diclofenac  Sodium 1 % Gel Commonly known as: VOLTAREN  Apply 4 g topically 4 (four) times daily. What changed:  when to take this reasons to take this   fluticasone  50 MCG/ACT nasal spray Commonly known as: FLONASE  Place 2 sprays into both nostrils daily.   furosemide  40 MG tablet Commonly known as: LASIX  Take 1 tablet (40 mg total) by mouth daily as needed for edema.   isosorbide  mononitrate 30 MG 24 hr tablet Commonly known as: IMDUR  Take 0.5 tablets (15 mg total) by mouth daily. What changed: how much to take   lidocaine  5 % ointment Commonly known as: XYLOCAINE  Apply 1 application topically daily as needed for mild pain or moderate pain.   loratadine  10 MG tablet Commonly known as: CLARITIN  Take 1 tablet (10 mg total) by mouth daily. What changed:  when to take this reasons to take this   nitroGLYCERIN  0.4 MG SL tablet Commonly known as: NITROSTAT  Place 1 tablet (0.4 mg total) under the tongue every 5 (five) minutes as needed for chest pain (chest pain).   omeprazole  40 MG capsule Commonly known as: PRILOSEC TAKE 1 CAPSULE(40 MG) BY MOUTH DAILY BEFORE BREAKFAST   ondansetron  8 MG disintegrating  tablet Commonly known as: ZOFRAN -ODT Take 1 tablet (8 mg total) by mouth every 8 (eight) hours as needed for nausea or vomiting. What changed: reasons to take this   One-A-Day Proactive 65+ Tabs Take 1 tablet by mouth daily with breakfast.   oxyCODONE -acetaminophen  10-325 MG tablet Commonly known as: PERCOCET Take 1 tablet by mouth 4 (four) times daily as needed for pain.   polyethylene glycol 17 g packet Commonly known as: MIRALAX  / GLYCOLAX  Take 17 g by mouth daily as needed for mild constipation.   ranolazine  500 MG 12 hr tablet Commonly known as: Ranexa  Take 1 tablet (500 mg total) by mouth 2 (two) times daily.   Stiolto Respimat  2.5-2.5 MCG/ACT Aers Generic drug: Tiotropium Bromide -Olodaterol INHALE 2 PUFFS INTO THE LUNGS DAILY  Systane Ultra PF 0.4-0.3 % Soln Generic drug: Polyethyl Glyc-Propyl Glyc PF Place 1 drop into both eyes 3 (three) times daily as needed (for dryness).   Tylenol  8 Hour Arthritis Pain 650 MG CR tablet Generic drug: acetaminophen  Take 650 mg by mouth every 8 (eight) hours as needed for pain. What changed: Another medication with the same name was removed. Continue taking this medication, and follow the directions you see here.        Follow-up Information     Genora Kidd, MD. Schedule an appointment as soon as possible for a visit in 1 week(s).   Specialty: Family Medicine Contact information: [address] [place] [address] [phone]                Allergies  Allergen Reactions   Tramadol  Nausea Only   Pollen Extract Other (See Comments)    Sneezing     Procedures/Studies: VAS US  CAROTID Result Date: 07/26/2023 Carotid Arterial Duplex Study Patient Name:  NUMAN ZYLSTRA Zebrowski  Date of Exam:   07/26/2023 Medical Rec #: [id number]         Accession #:    [id number] Date of Birth: [birth year]         Patient Gender: M Patient Age:   83 years Exam Location:  [facility] Procedure:      VAS US  CAROTID Referring  Phys: [facility] Clarence Cogswell --------------------------------------------------------------------------------  Indications:       Syncope. Risk Factors:      Hypertension, hyperlipidemia, past history of smoking. Comparison Study:  Prior carotid duplex done 10/12/22 indicating no significant                    carotid disease Performing Technologist: Carleene Chase RVS  Examination Guidelines: A complete evaluation includes B-mode imaging, spectral Doppler, color Doppler, and power Doppler as needed of all accessible portions of each vessel. Bilateral testing is considered an integral part of a complete examination. Limited examinations for reoccurring indications may be performed as noted.  Right Carotid Findings: +----------+--------+--------+--------+------------------+------------------+           PSV cm/sEDV cm/sStenosisPlaque DescriptionComments           +----------+--------+--------+--------+------------------+------------------+ CCA Prox  101     13                                                   +----------+--------+--------+--------+------------------+------------------+ CCA Distal83      15                                intimal thickening +----------+--------+--------+--------+------------------+------------------+ ICA Prox  66      16      1-39%   heterogenous                         +----------+--------+--------+--------+------------------+------------------+ ICA Mid   70      19                                                   +----------+--------+--------+--------+------------------+------------------+ ICA Distal75      23                                                   +----------+--------+--------+--------+------------------+------------------+  ECA       95      8                                                    +----------+--------+--------+--------+------------------+------------------+ +----------+--------+-------+--------+-------------------+            PSV cm/sEDV cmsDescribeArm Pressure (mmHG) +----------+--------+-------+--------+-------------------+ [id number]                                         +----------+--------+-------+--------+-------------------+ +---------+--------+--+--------+--+ VertebralPSV cm/s52EDV cm/s12 +---------+--------+--+--------+--+  Left Carotid Findings: +----------+--------+--------+--------+------------------+------------------+           PSV cm/sEDV cm/sStenosisPlaque DescriptionComments           +----------+--------+--------+--------+------------------+------------------+ CCA Prox  120     19                                intimal thickening +----------+--------+--------+--------+------------------+------------------+ CCA Distal84      21              heterogenous                         +----------+--------+--------+--------+------------------+------------------+ ICA Prox  50      15      1-39%   heterogenous                         +----------+--------+--------+--------+------------------+------------------+ ICA Mid   91      31                                                   +----------+--------+--------+--------+------------------+------------------+ ICA Distal87      29                                                   +----------+--------+--------+--------+------------------+------------------+ ECA       85      12                                                   +----------+--------+--------+--------+------------------+------------------+ +----------+--------+--------+--------+-------------------+           PSV cm/sEDV cm/sDescribeArm Pressure (mmHG) +----------+--------+--------+--------+-------------------+ [id number]                                         +----------+--------+--------+--------+-------------------+ +---------+--------+--+--------+--+ VertebralPSV cm/s84EDV cm/s19 +---------+--------+--+--------+--+    Summary: Right Carotid: Velocities in the right ICA are consistent with a 1-39% stenosis. Left Carotid: Velocities in the left ICA are consistent with a 1-39% stenosis. Vertebrals:  Bilateral vertebral arteries demonstrate antegrade flow. Subclavians: Normal flow hemodynamics were seen in bilateral subclavian  arteries. *See table(s) above for measurements and observations.     Preliminary    ECHOCARDIOGRAM COMPLETE Result Date: 07/26/2023    ECHOCARDIOGRAM REPORT   Patient Name:   Nikalas F Carmical Date of Exam: 07/26/2023 Medical Rec #:  [id number]        Height:       70.0 in Accession #:    [id number]       Weight:       162.0 lb Date of Birth:  [birth year]        BSA:          1.909 m Patient Age:    83 years         BP:           142/85 mmHg Patient Gender: M                HR:           82 bpm. Exam Location:  Inpatient Procedure: 2D Echo, Cardiac Doppler and Color Doppler (Both Spectral and Color            Flow Doppler were utilized during procedure). Indications:    Syncope R55  History:        Patient has prior history of Echocardiogram examinations, most                 recent 09/28/2022.  Sonographer:    Hersey Lorenzo RDCS Referring Phys: [id number] DAVID MANUEL ORTIZ IMPRESSIONS  1. Left ventricular ejection fraction, by estimation, is 60 to 65%. The left ventricle has normal function. The left ventricle has no regional wall motion abnormalities. There is mild left ventricular hypertrophy. Left ventricular diastolic parameters are consistent with Grade I diastolic dysfunction (impaired relaxation).  2. Right ventricular systolic function is normal. The right ventricular size is normal. Tricuspid regurgitation signal is inadequate for assessing PA pressure.  3. The mitral valve was not well visualized. Trivial mitral valve regurgitation. No evidence of mitral stenosis.  4. The aortic valve was not well visualized. Aortic valve regurgitation is trivial. Aortic valve sclerosis/calcification is  present, without any evidence of aortic stenosis. FINDINGS  Left Ventricle: Left ventricular ejection fraction, by estimation, is 60 to 65%. The left ventricle has normal function. The left ventricle has no regional wall motion abnormalities. The left ventricular internal cavity size was normal in size. There is  mild left ventricular hypertrophy. Left ventricular diastolic parameters are consistent with Grade I diastolic dysfunction (impaired relaxation). Right Ventricle: The right ventricular size is normal. No increase in right ventricular wall thickness. Right ventricular systolic function is normal. Tricuspid regurgitation signal is inadequate for assessing PA pressure. Left Atrium: Left atrial size was normal in size. Right Atrium: Right atrial size was normal in size. Pericardium: There is no evidence of pericardial effusion. Mitral Valve: The mitral valve was not well visualized. Trivial mitral valve regurgitation. No evidence of mitral valve stenosis. Tricuspid Valve: The tricuspid valve is normal in structure. Tricuspid valve regurgitation is trivial. Aortic Valve: The aortic valve was not well visualized. Aortic valve regurgitation is trivial. Aortic valve sclerosis/calcification is present, without any evidence of aortic stenosis. Pulmonic Valve: The pulmonic valve was not well visualized. Pulmonic valve regurgitation is not visualized. Aorta: The aortic root and ascending aorta are structurally normal, with no evidence of dilitation. IAS/Shunts: The interatrial septum was not well visualized.  LEFT VENTRICLE PLAX 2D LVIDd:         3.90 cm   Diastology LVIDs:  2.90 cm   LV e' medial:    4.79 cm/s LV PW:         1.00 cm   LV E/e' medial:  11.8 LV IVS:        1.10 cm   LV e' lateral:   8.27 cm/s LVOT diam:     2.10 cm   LV E/e' lateral: 6.8 LV SV:         66 LV SV Index:   34 LVOT Area:     3.46 cm  RIGHT VENTRICLE RV S prime:     9.25 cm/s TAPSE (M-mode): 1.6 cm LEFT ATRIUM             Index         RIGHT ATRIUM          Index LA diam:        2.60 cm 1.36 cm/m   RA Area:     9.47 cm LA Vol (A2C):   26.0 ml 13.62 ml/m  RA Volume:   18.20 ml 9.54 ml/m LA Vol (A4C):   24.9 ml 13.05 ml/m LA Biplane Vol: 25.4 ml 13.31 ml/m  AORTIC VALVE LVOT Vmax:   96.00 cm/s LVOT Vmean:  70.800 cm/s LVOT VTI:    0.190 m  AORTA Ao Root diam: 3.30 cm Ao Asc diam:  3.10 cm MITRAL VALVE MV Area (PHT): 3.27 cm    SHUNTS MV Decel Time: 232 msec    Systemic VTI:  0.19 m MV E velocity: 56.60 cm/s  Systemic Diam: 2.10 cm MV A velocity: 85.70 cm/s MV E/A ratio:  0.66 Carson Clara MD Electronically signed by Carson Clara MD Signature Date/Time: 07/26/2023/1:56:30 PM    Final    DG Chest 2 View Result Date: 07/25/2023 CLINICAL DATA:  Syncope. EXAM: CHEST - 2 VIEW COMPARISON:  Jun 15, 2023. FINDINGS: The heart size and mediastinal contours are within normal limits. Both lungs are clear. The visualized skeletal structures are unremarkable. IMPRESSION: No active cardiopulmonary disease. Electronically Signed   By: Rosalene Colon M.D.   On: 07/25/2023 14:06       Discharge Exam: Vitals:   07/26/23 0804 07/26/23 1130  BP:  (!) 140/86  Pulse:  76  Resp:  18  Temp:  98.1 F (36.7 C)  SpO2: 94% 100%    General: Pt is alert, awake, not in acute distress Cardiovascular: RRR, S1/S2 +, no edema Respiratory: CTA bilaterally, no wheezing, no rhonchi, no respiratory distress, no conversational dyspnea  Abdominal: Soft, NT, ND, bowel sounds + Extremities: no edema, no cyanosis Psych: Normal mood and affect, stable judgement and insight     The results of significant diagnostics from this hospitalization (including imaging, microbiology, ancillary and laboratory) are listed below for reference.     Microbiology: No results found for this or any previous visit (from the past 240 hours).   Labs: BNP (last 3 results) Recent Labs    08/28/22 0953 10/16/22 0300 05/21/23 0654  BNP 32.1 44.2 37.7    Basic Metabolic Panel: Recent Labs  Lab 07/25/23 1323 07/26/23 0857  NA 138 136  K 3.2* 3.5  CL 106 103  CO2 22 23  GLUCOSE 96 132*  BUN 15 9  CREATININE 0.87 0.68  CALCIUM  8.2* 8.8*  MG 1.6* 2.1   Liver Function Tests: Recent Labs  Lab 07/25/23 1323  AST 19  ALT 16  ALKPHOS 53  BILITOT 1.0  PROT 6.1*  ALBUMIN 3.4*   No results for input(s):  LIPASE, AMYLASE in the last 168 hours. No results for input(s): AMMONIA in the last 168 hours. CBC: Recent Labs  Lab 07/25/23 1323  WBC 6.3  HGB 11.9*  HCT 37.4*  MCV 89.5  PLT 193   Cardiac Enzymes: No results for input(s): CKTOTAL, CKMB, CKMBINDEX, TROPONINI in the last 168 hours. BNP: Invalid input(s): POCBNP CBG: Recent Labs  Lab 07/26/23 0537  GLUCAP 110*   D-Dimer No results for input(s): DDIMER in the last 72 hours. Hgb A1c No results for input(s): HGBA1C in the last 72 hours. Lipid Profile No results for input(s): CHOL, HDL, LDLCALC, TRIG, CHOLHDL, LDLDIRECT in the last 72 hours. Thyroid  function studies No results for input(s): TSH, T4TOTAL, T3FREE, THYROIDAB in the last 72 hours.  Invalid input(s): FREET3 Anemia work up No results for input(s): VITAMINB12, FOLATE, FERRITIN, TIBC, IRON, RETICCTPCT in the last 72 hours. Urinalysis    Component Value Date/Time   COLORURINE STRAW (A) 08/28/2022 0953   APPEARANCEUR CLEAR 08/28/2022 0953   LABSPEC 1.010 08/28/2022 0953   PHURINE 7.0 08/28/2022 0953   GLUCOSEU NEGATIVE 08/28/2022 0953   HGBUR NEGATIVE 08/28/2022 0953   BILIRUBINUR NEGATIVE 08/28/2022 0953   KETONESUR NEGATIVE 08/28/2022 0953   PROTEINUR NEGATIVE 08/28/2022 0953   UROBILINOGEN 1.0 11/14/2014 1600   NITRITE NEGATIVE 08/28/2022 0953   LEUKOCYTESUR NEGATIVE 08/28/2022 0953   Sepsis Labs Recent Labs  Lab 07/25/23 1323  WBC 6.3   Microbiology No results found for this or any previous visit (from the past 240  hours).   Patient was seen and examined on the day of discharge and was found to be in stable condition. Time coordinating discharge: 40 minutes including assessment and coordination of care, as well as examination of the patient.   SIGNED:  Daren Eck, DO [facility] 07/26/2023, 4:13 PM

## 2023-07-26 NOTE — Progress Notes (Signed)
 VASCULAR LAB    Carotid duplex has been performed.  See CV proc for preliminary results.   Dashawn Golda, RVT 07/26/2023, 10:14 AM

## 2023-07-26 NOTE — Progress Notes (Signed)
 Mobility Specialist - Progress Note   07/26/23 1231  Mobility  Activity Ambulated with assistance in hallway  Level of Assistance Contact guard assist, steadying assist  Assistive Device Front wheel walker  Distance Ambulated (ft) 150 ft  Range of Motion/Exercises Active  Activity Response Tolerated well  Mobility Referral Yes  Mobility visit 1 Mobility  Mobility Specialist Start Time (ACUTE ONLY) 1215  Mobility Specialist Stop Time (ACUTE ONLY) 1231  Mobility Specialist Time Calculation (min) (ACUTE ONLY) 16 min   Pt was found in bed and agreeable to ambulate. C/o bilateral knee pain. At EOS returned to recliner chair with all needs met. Call bell in reach and RN notified.  Lorna Rose,  Mobility Specialist Can be reached via Secure Chat

## 2023-07-26 NOTE — Progress Notes (Signed)
   07/26/23 1552  TOC Brief Assessment  Insurance and Status Reviewed  Patient has primary care physician Yes  Home environment has been reviewed Resides in single family home  Prior level of function: Independent with ADLs at baseline  Prior/Current Home Services No current home services  Social Drivers of Health Review SDOH reviewed no interventions necessary  Readmission risk has been reviewed Yes  Transition of care needs no transition of care needs at this time

## 2023-07-29 ENCOUNTER — Emergency Department (HOSPITAL_COMMUNITY)
Admission: EM | Admit: 2023-07-29 | Discharge: 2023-07-29 | Disposition: A | Discharge status: Home / Self Care | Attending: Emergency Medicine | Admitting: Emergency Medicine

## 2023-07-29 ENCOUNTER — Encounter (HOSPITAL_COMMUNITY): Payer: Self-pay

## 2023-07-29 ENCOUNTER — Emergency Department (HOSPITAL_COMMUNITY)

## 2023-07-29 ENCOUNTER — Ambulatory Visit (HOSPITAL_COMMUNITY): Admission: EM | Admit: 2023-07-29 | Discharge: 2023-07-29 | Disposition: A | Discharge status: Home / Self Care

## 2023-07-29 ENCOUNTER — Encounter (HOSPITAL_COMMUNITY): Payer: Self-pay | Admitting: Emergency Medicine

## 2023-07-29 ENCOUNTER — Telehealth: Payer: Self-pay

## 2023-07-29 DIAGNOSIS — Z8546 Personal history of malignant neoplasm of prostate: Secondary | ICD-10-CM | POA: Insufficient documentation

## 2023-07-29 DIAGNOSIS — R4182 Altered mental status, unspecified: Secondary | ICD-10-CM

## 2023-07-29 DIAGNOSIS — Z7982 Long term (current) use of aspirin: Secondary | ICD-10-CM | POA: Insufficient documentation

## 2023-07-29 DIAGNOSIS — Z79899 Other long term (current) drug therapy: Secondary | ICD-10-CM | POA: Diagnosis not present

## 2023-07-29 DIAGNOSIS — R7303 Prediabetes: Secondary | ICD-10-CM | POA: Diagnosis not present

## 2023-07-29 DIAGNOSIS — M7989 Other specified soft tissue disorders: Secondary | ICD-10-CM | POA: Diagnosis not present

## 2023-07-29 DIAGNOSIS — R55 Syncope and collapse: Secondary | ICD-10-CM | POA: Diagnosis not present

## 2023-07-29 DIAGNOSIS — L0291 Cutaneous abscess, unspecified: Secondary | ICD-10-CM | POA: Diagnosis not present

## 2023-07-29 DIAGNOSIS — R2231 Localized swelling, mass and lump, right upper limb: Secondary | ICD-10-CM | POA: Diagnosis not present

## 2023-07-29 DIAGNOSIS — G8929 Other chronic pain: Secondary | ICD-10-CM | POA: Diagnosis not present

## 2023-07-29 DIAGNOSIS — M25561 Pain in right knee: Secondary | ICD-10-CM | POA: Diagnosis not present

## 2023-07-29 DIAGNOSIS — L02413 Cutaneous abscess of right upper limb: Secondary | ICD-10-CM | POA: Diagnosis not present

## 2023-07-29 DIAGNOSIS — I10 Essential (primary) hypertension: Secondary | ICD-10-CM | POA: Insufficient documentation

## 2023-07-29 DIAGNOSIS — M7021 Olecranon bursitis, right elbow: Secondary | ICD-10-CM | POA: Diagnosis not present

## 2023-07-29 DIAGNOSIS — M25562 Pain in left knee: Secondary | ICD-10-CM | POA: Diagnosis not present

## 2023-07-29 DIAGNOSIS — L03113 Cellulitis of right upper limb: Secondary | ICD-10-CM | POA: Diagnosis not present

## 2023-07-29 DIAGNOSIS — E559 Vitamin D deficiency, unspecified: Secondary | ICD-10-CM | POA: Diagnosis not present

## 2023-07-29 DIAGNOSIS — J45909 Unspecified asthma, uncomplicated: Secondary | ICD-10-CM | POA: Insufficient documentation

## 2023-07-29 DIAGNOSIS — Z9181 History of falling: Secondary | ICD-10-CM | POA: Diagnosis not present

## 2023-07-29 DIAGNOSIS — M25521 Pain in right elbow: Secondary | ICD-10-CM | POA: Diagnosis not present

## 2023-07-29 DIAGNOSIS — Z09 Encounter for follow-up examination after completed treatment for conditions other than malignant neoplasm: Secondary | ICD-10-CM | POA: Diagnosis not present

## 2023-07-29 DIAGNOSIS — M549 Dorsalgia, unspecified: Secondary | ICD-10-CM | POA: Diagnosis not present

## 2023-07-29 LAB — CBC
HCT: 37 % — ABNORMAL LOW (ref 39.0–52.0)
Hemoglobin: 11.6 g/dL — ABNORMAL LOW (ref 13.0–17.0)
MCH: 28.2 pg (ref 26.0–34.0)
MCHC: 31.4 g/dL (ref 30.0–36.0)
MCV: 89.8 fL (ref 80.0–100.0)
Platelets: 236 10*3/uL (ref 150–400)
RBC: 4.12 MIL/uL — ABNORMAL LOW (ref 4.22–5.81)
RDW: 14.6 % (ref 11.5–15.5)
WBC: 6.6 10*3/uL (ref 4.0–10.5)
nRBC: 0 % (ref 0.0–0.2)

## 2023-07-29 LAB — COMPREHENSIVE METABOLIC PANEL WITH GFR
ALT: 16 U/L (ref 0–44)
AST: 14 U/L — ABNORMAL LOW (ref 15–41)
Albumin: 3.4 g/dL — ABNORMAL LOW (ref 3.5–5.0)
Alkaline Phosphatase: 57 U/L (ref 38–126)
Anion gap: 8 (ref 5–15)
BUN: 13 mg/dL (ref 8–23)
CO2: 26 mmol/L (ref 22–32)
Calcium: 8.8 mg/dL — ABNORMAL LOW (ref 8.9–10.3)
Chloride: 105 mmol/L (ref 98–111)
Creatinine, Ser: 0.84 mg/dL (ref 0.61–1.24)
GFR, Estimated: >60 mL/min (ref >60)
Glucose, Bld: 102 mg/dL — ABNORMAL HIGH (ref 70–99)
Potassium: 3.8 mmol/L (ref 3.5–5.1)
Sodium: 139 mmol/L (ref 135–145)
Total Bilirubin: 0.7 mg/dL (ref 0.0–1.2)
Total Protein: 6.5 g/dL (ref 6.5–8.1)

## 2023-07-29 LAB — URINALYSIS, ROUTINE W REFLEX MICROSCOPIC
Bilirubin Urine: NEGATIVE
Glucose, UA: NEGATIVE mg/dL
Hgb urine dipstick: NEGATIVE
Ketones, ur: NEGATIVE mg/dL
Leukocytes,Ua: NEGATIVE
Nitrite: NEGATIVE
Protein, ur: NEGATIVE mg/dL
Specific Gravity, Urine: 1.021 (ref 1.005–1.030)
pH: 6 (ref 5.0–8.0)

## 2023-07-29 MED ORDER — LIDOCAINE HCL (PF) 1 % IJ SOLN
10.0000 mL | Freq: Once | INTRAMUSCULAR | Status: AC
  Administered 2023-07-29: 10 mL
  Filled 2023-07-29: qty 30

## 2023-07-29 MED ORDER — DOXYCYCLINE HYCLATE 100 MG PO TABS
100.0000 mg | ORAL_TABLET | Freq: Once | ORAL | Status: AC
  Filled 2023-07-29: qty 1

## 2023-07-29 MED ORDER — DOXYCYCLINE HYCLATE 100 MG PO CAPS: 100.0000 mg | ORAL_CAPSULE | Freq: Two times a day (BID) | ORAL | 0 refills | Status: AC

## 2023-07-29 NOTE — ED Provider Triage Note (Signed)
 Emergency Medicine Provider Triage Evaluation Note  Joel Donaldson , a 83 y.o. male  was evaluated in triage.  Pt complains of near syncope at home, right elbow infection.  Sent from [facility].  Admitted last week to hospital for weakness and near syncope.  Here with daughter.  She says his right arm is swollen and no on had noted his elbow wound during last stay  Review of Systems  Positive: Elbow pain Negative: Fevers, chills  Physical Exam  BP 118/80 (BP Location: Left Arm)   Pulse 94   Temp 98.4 F (36.9 C) (Oral)   Resp 18   Ht 5' 10 (1.778 m)   Wt 73.5 kg   SpO2 100%   BMI 23.24 kg/m  Gen:   Awake, no distress   Resp:  Normal effort  MSK:   Moves extremities without difficulty  Other:  Right arm swollen, right elbow with pustule draining fluid, full ROM of elbow with minimal tenderness  Medical Decision Making  Medically screening exam initiated at 3:09 PM.  Appropriate orders placed.  YAVUZ KIRBY was informed that the remainder of the evaluation will be completed by another provider, this initial triage assessment does not replace that evaluation, and the importance of remaining in the ED until their evaluation is complete.  Possible infected bursititis of right elbow; good ROM of the elbow, lower suspicion for septic joint  Near syncope evaluation ordered, will check WBC, doubt bacteremia or sepsis   Arvilla Birmingham, MD 07/29/23 1511

## 2023-07-29 NOTE — ED Triage Notes (Signed)
 Pt repots that hit right elbow on dresser month ago and hasn't healed up. Reports was in hospital couple days ago due to syncopal episodes but they didn't look at elbow. Reports some drainage and pain.

## 2023-07-29 NOTE — ED Provider Notes (Signed)
 [place] EMERGENCY DEPARTMENT AT [facility] Provider Note   CSN: [id number] Arrival date & time: 07/29/23  1435     Patient presents with: Wound Check and Altered Mental Status   Joel Donaldson is a 83 y.o. male with a history of alcohol use disorder, depression, asthma, angina, hypertension, prostate cancer with seed implant mets, syncope, presenting to ED in the company of his daughter as a referral from urgent care with concern for near syncope and right arm wound.  The patient's daughter provides supplemental history.  She reports they were just discharged from the hospital last week after admission for syncope, which time, per my review of external records, he had an unremarkable echocardiogram, no emergent findings on chest x-ray.  His Lasix  was briefly held in the hospital and this was resumed as needed at the time of discharge for leg swelling.  He was noted to have hypocalcemia and hypokalemia during this hospital stay.  His daughter reports that the patient had a wound on his right elbow that was not noticed during last hospital stay and she feels it has gotten worse, and his right arm is now swollen.  They do not report fevers or chills.  Echo 3 days ago in hospital with EF 60-65%, no emergent findings   HPI     Prior to Admission medications   Medication Sig Start Date End Date Taking? Authorizing Provider  doxycycline (VIBRAMYCIN) 100 MG capsule Take 1 capsule (100 mg total) by mouth 2 (two) times daily for 10 days. 07/30/23 08/09/23 Yes Justyn Langham, Janalyn Me, MD  albuterol  (VENTOLIN  HFA) 108 [phone] Base) MCG/ACT inhaler INHALE 1 TO 2 PUFFS INTO THE LUNGS EVERY 4 HOURS AS NEEDED FOR WHEEZING OR SHORTNESS OF BREATH 06/25/23   Genora Kidd, MD  aspirin  EC 81 MG tablet Take 81 mg by mouth every morning.     [provider]  atorvastatin  (LIPITOR) 20 MG tablet TAKE 1 TABLET(20 MG) BY MOUTH DAILY 03/14/23   Croitoru, Mihai, MD  buprenorphine  (BUTRANS ) 10 MCG/HR  PTWK Place 1 patch onto the skin every Friday. 04/29/23   [provider]  diclofenac  Sodium (VOLTAREN ) 1 % GEL Apply 4 g topically 4 (four) times daily. Patient taking differently: Apply 4 g topically 4 (four) times daily as needed (for pain- affected areas). 01/10/21   Brimage, Vondra, DO  fluticasone  (FLONASE ) 50 MCG/ACT nasal spray Place 2 sprays into both nostrils daily. 05/30/23   Genora Kidd, MD  furosemide  (LASIX ) 40 MG tablet Take 1 tablet (40 mg total) by mouth daily as needed for edema. 05/22/23   [name], MD  isosorbide  mononitrate (IMDUR ) 30 MG 24 hr tablet Take 0.5 tablets (15 mg total) by mouth daily. Patient taking differently: Take 30 mg by mouth daily. 06/11/23 09/09/23  [name], PA-C  lidocaine  (XYLOCAINE ) 5 % ointment Apply 1 application topically daily as needed for mild pain or moderate pain. 01/10/21   Brimage, Vondra, DO  loratadine  (CLARITIN ) 10 MG tablet Take 1 tablet (10 mg total) by mouth daily. Patient taking differently: Take 10 mg by mouth daily as needed for allergies or rhinitis. 05/30/23   Genora Kidd, MD  Multiple Vitamins-Minerals (ONE-A-DAY PROACTIVE 65+) TABS Take 1 tablet by mouth daily with breakfast.    [provider]  nitroGLYCERIN  (NITROSTAT ) 0.4 MG SL tablet Place 1 tablet (0.4 mg total) under the tongue every 5 (five) minutes as needed for chest pain (chest pain). 05/25/21   Brimage, Vondra, DO  omeprazole  (PRILOSEC)  40 MG capsule TAKE 1 CAPSULE(40 MG) BY MOUTH DAILY BEFORE BREAKFAST 06/12/23   Genora Kidd, MD  ondansetron  (ZOFRAN -ODT) 8 MG disintegrating tablet Take 1 tablet (8 mg total) by mouth every 8 (eight) hours as needed for nausea or vomiting. Patient taking differently: Take 8 mg by mouth every 8 (eight) hours as needed for nausea or vomiting (dissolve orally). 06/15/23   [name], MD  oxyCODONE -acetaminophen  (PERCOCET) 10-325 MG tablet Take 1 tablet by mouth 4 (four) times daily as needed for pain. 02/26/22    [provider]  polyethylene glycol (MIRALAX  / GLYCOLAX ) 17 g packet Take 17 g by mouth daily as needed for mild constipation. 01/10/21   Brimage, Vondra, DO  ranolazine  (RANEXA ) 500 MG 12 hr tablet Take 1 tablet (500 mg total) by mouth 2 (two) times daily. 07/09/23   Reyne Cave, Scott T, PA-C  SYSTANE ULTRA PF 0.4-0.3 % SOLN Place 1 drop into both eyes 3 (three) times daily as needed (for dryness).    [provider]  Tiotropium Bromide -Olodaterol (STIOLTO RESPIMAT ) 2.5-2.5 MCG/ACT AERS INHALE 2 PUFFS INTO THE LUNGS DAILY 07/19/23   Genora Kidd, MD  TYLENOL  8 HOUR ARTHRITIS PAIN 650 MG CR tablet Take 650 mg by mouth every 8 (eight) hours as needed for pain.    [provider]    Allergies: Tramadol  and Pollen extract    Review of Systems  Updated Vital Signs BP (!) 176/92 (BP Location: Right Arm)   Pulse 77   Temp 98.5 F (36.9 C)   Resp 16   Ht 5' 10 (1.778 m)   Wt 73.5 kg   SpO2 93%   BMI 23.24 kg/m   Physical Exam Constitutional:      General: He is not in acute distress. HENT:     Head: Normocephalic and atraumatic.   Eyes:     Conjunctiva/sclera: Conjunctivae normal.     Pupils: Pupils are equal, round, and reactive to light.    Cardiovascular:     Rate and Rhythm: Normal rate and regular rhythm.  Pulmonary:     Effort: Pulmonary effort is normal. No respiratory distress.  Abdominal:     General: There is no distension.     Tenderness: There is no abdominal tenderness.   Skin:    General: Skin is warm and dry.     Comments: Diffuse right arm swelling without crepitus, erythema, or significant tenderness with range of motion testing, including at the right elbow.  There is an approximate 4 to 5 cm region of fluctuance with some purulent drainage directly superficial to the skin at the right elbow   Neurological:     General: No focal deficit present.     Mental Status: He is alert. Mental status is at baseline.   Psychiatric:         Mood and Affect: Mood normal.        Behavior: Behavior normal.     (all labs ordered are listed, but only abnormal results are displayed) Labs Reviewed  COMPREHENSIVE METABOLIC PANEL WITH GFR - Abnormal; Notable for the following components:      Result Value   Glucose, Bld 102 (*)    Calcium  8.8 (*)    Albumin 3.4 (*)    AST 14 (*)    All other components within normal limits  CBC - Abnormal; Notable for the following components:   RBC 4.12 (*)    Hemoglobin 11.6 (*)    HCT 37.0 (*)    All  other components within normal limits  URINALYSIS, ROUTINE W REFLEX MICROSCOPIC    EKG: EKG Interpretation Date/Time:  Monday July 29 2023 14:57:39 EDT Ventricular Rate:  95 PR Interval:  171 QRS Duration:  84 QT Interval:  358 QTC Calculation: 450 R Axis:   -17  Text Interpretation: Sinus rhythm Atrial premature complex Left axis deviation, normal intervals, no STEMI, no acute change when compared to prior EKG from 07/25/2023 Confirmed by Lorriane Rote ([id number]) on 07/29/2023 3:09:55 PM  Radiology: VAS US  UPPER EXTREMITY VENOUS DUPLEX Result Date: 07/29/2023 UPPER VENOUS STUDY  Patient Name:  Montray F Seward  Date of Exam:   07/29/2023 Medical Rec #: [id number]         Accession #:    [id number] Date of Birth: 1941-02-02         Patient Gender: M Patient Age:   22 years Exam Location:  [facility] Procedure:      VAS US  UPPER EXTREMITY VENOUS DUPLEX Referring Phys: Zoila Hines Shaundra Fullam --------------------------------------------------------------------------------  Indications: Patient hit elbow on dresser 1 month ago. Pain and drainage from wound. Admitted to hospital 07/25/23 for syncope and dizziness, but did not have elbow evaluated at that time. Other Indications: Olecranon bursitis by X-ray. Limitations: Subcutaneous edema, tightness of arm. Comparison Study: No prior UEV on file Performing Technologist: Carleene Chase RVS  Examination Guidelines: A complete evaluation includes B-mode  imaging, spectral Doppler, color Doppler, and power Doppler as needed of all accessible portions of each vessel. Bilateral testing is considered an integral part of a complete examination. Limited examinations for reoccurring indications may be performed as noted.  Right Findings: +----------+------------+---------+-----------+----------+---------------+ RIGHT     CompressiblePhasicitySpontaneousProperties    Summary     +----------+------------+---------+-----------+----------+---------------+ IJV           Full       Yes       Yes                              +----------+------------+---------+-----------+----------+---------------+ Subclavian    Full       Yes       Yes                              +----------+------------+---------+-----------+----------+---------------+ Axillary                 Yes       Yes                              +----------+------------+---------+-----------+----------+---------------+ Brachial                                            patent by color +----------+------------+---------+-----------+----------+---------------+ Radial        Full                                                  +----------+------------+---------+-----------+----------+---------------+ Ulnar  Not visualized  +----------+------------+---------+-----------+----------+---------------+ Cephalic      Full                                                  +----------+------------+---------+-----------+----------+---------------+ Basilic       Full                                                  +----------+------------+---------+-----------+----------+---------------+  Left Findings: +----------+------------+---------+-----------+----------+-------+ LEFT      CompressiblePhasicitySpontaneousPropertiesSummary +----------+------------+---------+-----------+----------+-------+ Subclavian                Yes       Yes                      +----------+------------+---------+-----------+----------+-------+  *See table(s) above for measurements and observations.    Preliminary    DG Elbow Complete Right Result Date: 07/29/2023 CLINICAL DATA:  Pain and swelling EXAM: RIGHT ELBOW - COMPLETE 3+ VIEW COMPARISON:  None Available. FINDINGS: There is no evidence of fracture, dislocation, or joint effusion. There is no evidence of arthropathy or other focal bone abnormality. Olecranon soft tissue swelling could correlate with the olecranon bursitis IMPRESSION: Olecranon bursitis Electronically Signed   By: Fredrich Jefferson M.D.   On: 07/29/2023 15:35     .Incision and Drainage  Date/Time: 07/29/2023 7:29 PM  Performed by: Arvilla Birmingham, MD Authorized by: Arvilla Birmingham, MD   Consent:    Consent obtained:  Verbal   Consent given by:  Patient   Risks, benefits, and alternatives were discussed: yes     Risks discussed:  Bleeding, incomplete drainage, infection, pain and damage to other organs   Alternatives discussed:  No treatment Universal protocol:    Procedure explained and questions answered to patient or proxy's satisfaction: yes     Relevant documents present and verified: yes     Test results available : yes     Imaging studies available: yes     Required blood products, implants, devices, and special equipment available: yes     Site/side marked: yes     Immediately prior to procedure, a time out was called: yes     Patient identity confirmed:  Arm band Location:    Type:  Abscess   Size:  4 cm   Location:  Upper extremity   Upper extremity location:  Elbow   Elbow location:  R elbow Pre-procedure details:    Skin preparation:  Povidone-iodine Sedation:    Sedation type:  None Anesthesia:    Anesthesia method:  Local infiltration   Local anesthetic:  Lidocaine  1% w/o epi Procedure type:    Complexity:  Simple Procedure details:    Ultrasound guidance: yes     Needle  aspiration: no     Incision types:  Single straight   Wound management:  Probed and deloculated   Drainage:  Purulent   Drainage amount:  Moderate   Wound treatment:  Wound left open   Packing materials:  None Post-procedure details:    Procedure completion:  Tolerated well, no immediate complications    Medications Ordered in the ED  lidocaine  (PF) (XYLOCAINE ) 1 % injection 10 mL (has no administration in time range)  doxycycline (  VIBRA-TABS) tablet 100 mg (100 mg Oral Given 07/29/23 1931)                                    Medical Decision Making Amount and/or Complexity of Data Reviewed Labs: ordered. Radiology: ordered.  Risk Prescription drug management.   This patient presents to the ED with concern for right elbow wound and arm swelling. This involves an extensive number of treatment options, and is a complaint that carries with it a high risk of complications and morbidity.  The differential diagnosis includes DVT versus abscess versus other infection versus other  Additional history obtained from patient's daughter at bedside  External records from outside source obtained and reviewed including urgent care evaluation  I ordered and personally interpreted labs.  The pertinent results include: No emergent findings.  White blood cell count normal  I ordered imaging studies including x-ray of the elbow and right upper extremity vascular ultrasound I independently visualized and interpreted imaging which showed no emergent findings, no acute DVT I agree with the radiologist interpretation   I ordered medication including doxycycline for abscess, most likely staph and MRSA coverage  I have reviewed the patients home medicines and have made adjustments as needed  Test Considered: Doubt sepsis, bacteremia   Disposition:  After consideration of the diagnostic results and the patient's response to treatment, I feel that the patent would benefit from close outpatient  follow-up.  They have a PCP appointment tomorrow.  I do not see an indication for hospitalization at this time.  I have a low suspicion for tenosynovitis clinically, doubt sepsis or bacteremia.      Final diagnoses:  Abscess of right elbow  Arm swelling    ED Discharge Orders          Ordered    doxycycline (VIBRAMYCIN) 100 MG capsule  2 times daily        07/29/23 1929               Arvilla Birmingham, MD 07/29/23 1932

## 2023-07-29 NOTE — ED Triage Notes (Signed)
 Pt c/o wound on R elbow since hitting elbow on a dresser x1 month ago and continued syncopal episodes since being discharged on 6/12.  Drainage and pain noted to elbow.  Wound looks like an abscess.     Pt's daughter reports the Pt is losing weight, getting weaker, and is mildly confused.

## 2023-07-29 NOTE — Transitions of Care (Post Inpatient/ED Visit) (Signed)
 07/29/2023  Name: Joel Donaldson MRN: [id number] DOB: [birth year]  Today's TOC FU Call Status: Today's TOC FU Call Status:: Successful TOC FU Call Completed TOC FU Call Complete Date: 07/29/23 Patient's Name and Date of Birth confirmed.  Transition Care Management Follow-up Telephone Call Date of Discharge: 07/26/23 Discharge Facility: [facility]) Type of Discharge: Inpatient Admission Primary Inpatient Discharge Diagnosis:: syncope How have you been since you were released from the hospital?: Better Any questions or concerns?: No  Items Reviewed: Did you receive and understand the discharge instructions provided?: Yes Medications obtained,verified, and reconciled?: Yes (Medications Reviewed) Any new allergies since your discharge?: No Dietary orders reviewed?: Yes Do you have support at home?: Yes Name of Support/Comfort Primary Source: aid  Medications Reviewed Today: Medications Reviewed Today     Reviewed by Darrall Ellison, LPN (Licensed Practical Nurse) on 07/29/23 at 1006  Med List Status: <None>   Medication Order Taking? Sig Documenting Provider Last Dose Status Informant  albuterol  (VENTOLIN  HFA) 108 (90 Base) MCG/ACT inhaler [id number] Yes INHALE 1 TO 2 PUFFS INTO THE LUNGS EVERY 4 HOURS AS NEEDED FOR WHEEZING OR SHORTNESS OF [name], Mayuri, MD  Active Self  aspirin  EC 81 MG tablet [id number] Yes Take 81 mg by mouth every morning.  [provider]  Active Self  atorvastatin  (LIPITOR) 20 MG tablet [id number] Yes TAKE 1 TABLET(20 MG) BY MOUTH DAILY Croitoru, Mihai, MD  Active Self  buprenorphine  (BUTRANS ) 10 MCG/HR PTWK [id number] Yes Place 1 patch onto the skin every Friday. [provider]  Active Self           Med Note [name], [name]   Thu Jul 25, 2023  5:39 PM) The patient stated his most recent patch is not on at this time- I asked  diclofenac  Sodium (VOLTAREN ) 1 % GEL [id number] Yes Apply 4 g topically 4 (four) times daily.  Patient  taking differently: Apply 4 g topically 4 (four) times daily as needed (for pain- affected areas).   Brimage, Vondra, DO  Active Self  fluticasone  (FLONASE ) 50 MCG/ACT nasal spray [id number] Yes Place 2 sprays into both nostrils daily. Genora Kidd, MD  Active Self  furosemide  (LASIX ) 40 MG tablet [id number] Yes Take 1 tablet (40 mg total) by mouth daily as needed for edema. [name], MD  Active Self  isosorbide  mononitrate (IMDUR ) 30 MG 24 hr tablet [id number] Yes Take 0.5 tablets (15 mg total) by mouth daily.  Patient taking differently: Take 30 mg by mouth daily.   [name], PA-C  Active Self  lidocaine  (XYLOCAINE ) 5 % ointment [id number] Yes Apply 1 application topically daily as needed for mild pain or moderate pain. Brimage, Vondra, DO  Active Self  loratadine  (CLARITIN ) 10 MG tablet [id number] Yes Take 1 tablet (10 mg total) by mouth daily.  Patient taking differently: Take 10 mg by mouth daily as needed for allergies or rhinitis.   Genora Kidd, MD  Active Self  Multiple Vitamins-Minerals (ONE-A-DAY PROACTIVE 65+) TABS [id number] Yes Take 1 tablet by mouth daily with breakfast. [provider]  Active Self  nitroGLYCERIN  (NITROSTAT ) 0.4 MG SL tablet [id number] Yes Place 1 tablet (0.4 mg total) under the tongue every 5 (five) minutes as needed for chest pain (chest pain). Brimage, Vondra, DO  Active Self           Med Note [name], MICHELE   Tue Jun 11, 2023  1:28 PM)    omeprazole  (PRILOSEC) 40 MG capsule [id number] Yes TAKE 1  CAPSULE(40 MG) BY MOUTH DAILY BEFORE [name], MD  Active Self  ondansetron  (ZOFRAN -ODT) 8 MG disintegrating tablet [id number] Yes Take 1 tablet (8 mg total) by mouth every 8 (eight) hours as needed for nausea or vomiting.  Patient taking differently: Take 8 mg by mouth every 8 (eight) hours as needed for nausea or vomiting (dissolve orally).   [name], MD  Active Self  oxyCODONE -acetaminophen  (PERCOCET) 10-325 MG tablet  [id number] Yes Take 1 tablet by mouth 4 (four) times daily as needed for pain. [provider]  Active Self  polyethylene glycol (MIRALAX  / GLYCOLAX ) 17 g packet [id number] Yes Take 17 g by mouth daily as needed for mild constipation. Brimage, Vondra, DO  Active Self  ranolazine  (RANEXA ) 500 MG 12 hr tablet [id number] Yes Take 1 tablet (500 mg total) by mouth 2 (two) times daily. [name], PA-C  Active Self  SYSTANE ULTRA PF 0.4-0.3 % SOLN [id number] Yes Place 1 drop into both eyes 3 (three) times daily as needed (for dryness). [provider]  Active Self  Tiotropium Bromide -Olodaterol (STIOLTO RESPIMAT ) 2.5-2.5 MCG/ACT AERS [id number] Yes INHALE 2 PUFFS INTO THE LUNGS DAILY [name], Mayuri, MD  Active Self           Med Note [name], [name]   Thu Jul 25, 2023  5:42 PM) This REPLACED INCRUSE ELLIPTA      TYLENOL  8 HOUR ARTHRITIS PAIN 650 MG CR tablet [id number] Yes Take 650 mg by mouth every 8 (eight) hours as needed for pain. [provider]  Active Self            Home Care and Equipment/Supplies: Were Home Health Services Ordered?: NA Any new equipment or medical supplies ordered?: NA  Functional Questionnaire: Do you need assistance with bathing/showering or dressing?: No Do you need assistance with meal preparation?: No Do you need assistance with eating?: No Do you have difficulty maintaining continence: No Do you need assistance with getting out of bed/getting out of a chair/moving?: No Do you have difficulty managing or taking your medications?: No  Follow up appointments reviewed: PCP Follow-up appointment confirmed?: No (sent message to staff to schedule) MD Provider Line [phone] Given: No Specialist Hospital Follow-up appointment confirmed?: NA Do you need transportation to your follow-up appointment?: No Do you understand care options if your condition(s) worsen?: Yes-patient verbalized understanding    SIGNATURE Darrall Ellison, LPN [facility] Nurse Health Advisor Direct Dial [phone]

## 2023-07-29 NOTE — Telephone Encounter (Signed)
 Contacted the patient to schedule his hosp f/u. He scheduled for tomorrow at 310.  Patient stated that he is currently at the urgent care because his elbow looks infected.

## 2023-07-29 NOTE — ED Notes (Signed)
 Patient is being discharged from the Urgent Care and sent to the Emergency Department via POV . Per Donita Furrow, NP, patient is in need of higher level of care due to [facility]. Patient is aware and verbalizes understanding of plan of care.  Vitals:   07/29/23 1321  BP: 103/67  Pulse: 92  Resp: 18  Temp: 98 F (36.7 C)  SpO2: 96%

## 2023-07-29 NOTE — Discharge Instructions (Addendum)
 Please take him to the emergency department for further evaluation.

## 2023-07-29 NOTE — ED Provider Notes (Signed)
 [facility]    CSN: [id number] Arrival date & time: 07/29/23  1224      History   Chief Complaint Chief Complaint  Patient presents with   Wound Check    HPI Joel Donaldson is a 83 y.o. male.   Patient presents with daughter for wound check.  Patient reports that he hit his elbow on a dresser about a month ago and the area has not healed since then.  Daughter states that she was just not seeing his elbow today and noticed that it looked like it was significantly infected.  Daughter reports swelling, redness, and drainage to the elbow.  Patient was recently admitted to the hospital couple days ago for syncopal episodes, but states that they did not evaluate his elbow at that time.  Daughter states that when she picked up the patient on 6/14 from the hospital he went home and had another syncopal episode at that time.    Daughter states that patient has become progressively altered and confused over the last few days.  Daughter states that he has not been acting himself and is very weak.  Denies any known fever.  The history is provided by the patient and medical records.  Wound Check    Past Medical History:  Diagnosis Date   Acute nonintractable headache 05/31/2021   Alcohol abuse    Anxiety    Arthritis    hands, back, knees   Asthma    Atypical angina (HCC) (Class III) 12/19/2011   Baker's cyst of knee, right 06/21/2016   Chest pain at rest 10/04/2016   Depression    Epigastric pain 05/20/2020   Fall 10/23/2021   GERD (gastroesophageal reflux disease)    Glaucoma    Heart murmur asymptomatic   Hx of radioisotope therapy 07/26/2011   Radiosactive Prostate Seed Implant/ I-125 Seeds   Hypertension    Neck pain 10/22/2019   Prostate cancer (HCC) dx'd 2016 or 2017   seed implants   Rheumatoid arthritis (HCC) 10/10/2012    Patient Active Problem List   Diagnosis Date Noted   Hypovolemia 07/26/2023   Normocytic anemia 07/25/2023   Hypokalemia  07/25/2023   Mild protein malnutrition (HCC) 07/25/2023   Hypocalcemia 07/25/2023   CAD (coronary artery disease) 06/20/2023   Chronic health problem 05/22/2023   Hypoxic respiratory failure (HCC) 05/21/2023   Pneumonia 05/21/2023   Acute hypoxic respiratory failure (HCC) 05/21/2023   History of colonic polyps 04/17/2022   Deficit in activities of daily living (ADL) 11/21/2021   Fall 10/23/2021   Age-related cataract of left eye 01/10/2021   Cataract 01/10/2021   Abnormal cardiac CT angiography 09/16/2020   Atherosclerosis of aorta (HCC) 05/20/2020   Opioid dependence (HCC) 11/29/2017   HTN (hypertension)    Backache 09/19/2015   Rheumatoid arthritis (HCC) 10/10/2012   Osteoarthritis of both shoulders 02/15/2012   Osteoarthritis of both knees 07/02/2011   Syncope 11/27/2010   Alcohol use 05/01/2010   INTENTION TREMOR 09/09/2008   Prostate cancer (HCC) 07/01/2008   Hyperlipidemia 06/24/2007   COPD (chronic obstructive pulmonary disease) (HCC) 05/12/2007   GASTROESOPHAGEAL REFLUX, NO ESOPHAGITIS 04/11/2006    Past Surgical History:  Procedure Laterality Date   CYSTOSCOPY  09/14/2011   Procedure: CYSTOSCOPY FLEXIBLE;  Surgeon: Jinny Mounts, MD;  Location: [facility] [place];  Service: Urology;  Laterality: N/A;  No seeds seen in the bladder   LEFT HEART CATH AND CORONARY ANGIOGRAPHY N/A 09/16/2020   Procedure: LEFT HEART CATH AND CORONARY ANGIOGRAPHY;  Surgeon: Arleen Lacer, MD;  Location: [facility] INVASIVE CV LAB;  Service: Cardiovascular;  Laterality: N/A;   LUMBAR FUSION  1995   RADIOACTIVE SEED IMPLANT  09/14/2011   Procedure: RADIOACTIVE SEED IMPLANT;  Surgeon: Jinny Mounts, MD;  Location: [facility] [place];  Service: Urology;  Laterality: N/A;  Total number of seeds -     SHOULDER SURGERY  YRS AGO   LEFT       Home Medications    Prior to Admission medications   Medication Sig Start Date End Date Taking? Authorizing Provider  albuterol  (VENTOLIN   HFA) 108 (90 Base) MCG/ACT inhaler INHALE 1 TO 2 PUFFS INTO THE LUNGS EVERY 4 HOURS AS NEEDED FOR WHEEZING OR SHORTNESS OF BREATH 06/25/23   Genora Kidd, MD  aspirin  EC 81 MG tablet Take 81 mg by mouth every morning.     [provider]  atorvastatin  (LIPITOR) 20 MG tablet TAKE 1 TABLET(20 MG) BY MOUTH DAILY 03/14/23   Croitoru, Mihai, MD  buprenorphine  (BUTRANS ) 10 MCG/HR PTWK Place 1 patch onto the skin every Friday. 04/29/23   [provider]  diclofenac  Sodium (VOLTAREN ) 1 % GEL Apply 4 g topically 4 (four) times daily. Patient taking differently: Apply 4 g topically 4 (four) times daily as needed (for pain- affected areas). 01/10/21   Brimage, Vondra, DO  fluticasone  (FLONASE ) 50 MCG/ACT nasal spray Place 2 sprays into both nostrils daily. 05/30/23   Genora Kidd, MD  furosemide  (LASIX ) 40 MG tablet Take 1 tablet (40 mg total) by mouth daily as needed for edema. 05/22/23   [name], MD  isosorbide  mononitrate (IMDUR ) 30 MG 24 hr tablet Take 0.5 tablets (15 mg total) by mouth daily. Patient taking differently: Take 30 mg by mouth daily. 06/11/23 09/09/23  [name], PA-C  lidocaine  (XYLOCAINE ) 5 % ointment Apply 1 application topically daily as needed for mild pain or moderate pain. 01/10/21   Brimage, Vondra, DO  loratadine  (CLARITIN ) 10 MG tablet Take 1 tablet (10 mg total) by mouth daily. Patient taking differently: Take 10 mg by mouth daily as needed for allergies or rhinitis. 05/30/23   Genora Kidd, MD  Multiple Vitamins-Minerals (ONE-A-DAY PROACTIVE 65+) TABS Take 1 tablet by mouth daily with breakfast.    [provider]  nitroGLYCERIN  (NITROSTAT ) 0.4 MG SL tablet Place 1 tablet (0.4 mg total) under the tongue every 5 (five) minutes as needed for chest pain (chest pain). 05/25/21   Brimage, Vondra, DO  omeprazole  (PRILOSEC) 40 MG capsule TAKE 1 CAPSULE(40 MG) BY MOUTH DAILY BEFORE BREAKFAST 06/12/23   Genora Kidd, MD  ondansetron  (ZOFRAN -ODT) 8  MG disintegrating tablet Take 1 tablet (8 mg total) by mouth every 8 (eight) hours as needed for nausea or vomiting. Patient taking differently: Take 8 mg by mouth every 8 (eight) hours as needed for nausea or vomiting (dissolve orally). 06/15/23   [name], MD  oxyCODONE -acetaminophen  (PERCOCET) 10-325 MG tablet Take 1 tablet by mouth 4 (four) times daily as needed for pain. 02/26/22   [provider]  polyethylene glycol (MIRALAX  / GLYCOLAX ) 17 g packet Take 17 g by mouth daily as needed for mild constipation. 01/10/21   Brimage, Vondra, DO  ranolazine  (RANEXA ) 500 MG 12 hr tablet Take 1 tablet (500 mg total) by mouth 2 (two) times daily. 07/09/23   Reyne Cave, Scott T, PA-C  SYSTANE ULTRA PF 0.4-0.3 % SOLN Place 1 drop into both eyes 3 (three) times daily as needed (for dryness).    [provider]  Tiotropium Bromide -Olodaterol (STIOLTO RESPIMAT ) 2.5-2.5 MCG/ACT AERS INHALE 2 PUFFS INTO THE LUNGS DAILY 07/19/23   Genora Kidd, MD  TYLENOL  8 HOUR ARTHRITIS PAIN 650 MG CR tablet Take 650 mg by mouth every 8 (eight) hours as needed for pain.    [provider]    Family History Family History  Problem Relation Age of Onset   Diabetes Mother    Heart disease Father        lived to 14 - had bypass, pacemaker    Social History Social History   Tobacco Use   Smoking status: Former    Current packs/day: 0.00    Average packs/day: 2.0 packs/day for 51.7 years (103.3 ttl pk-yrs)    Types: Cigarettes    Start date: 02/12/1953    Quit date: 10/13/2004    Years since quitting: 18.8    Passive exposure: Past   Smokeless tobacco: Never   Tobacco comments:    Smoked 2 ppd  Vaping Use   Vaping status: Never Used  Substance Use Topics   Alcohol use: Not Currently    Alcohol/week: 14.0 standard drinks of alcohol    Types: 14 Shots of liquor per week    Comment: 1/2 pint of liquor daily   Drug use: No     Allergies   Tramadol  and Pollen extract   Review of  Systems Review of Systems  Per HPI  Physical Exam Triage Vital Signs ED Triage Vitals [07/29/23 1321]  Encounter Vitals Group     BP 103/67     Girls Systolic BP Percentile      Girls Diastolic BP Percentile      Boys Systolic BP Percentile      Boys Diastolic BP Percentile      Pulse Rate 92     Resp 18     Temp 98 F (36.7 C)     Temp Source Oral     SpO2 96 %     Weight      Height      Head Circumference      Peak Flow      Pain Score      Pain Loc      Pain Education      Exclude from Growth Chart    No data found.  Updated Vital Signs BP 103/67 (BP Location: Left Arm)   Pulse 92   Temp 98 F (36.7 C) (Oral)   Resp 18   SpO2 96%   Visual Acuity Right Eye Distance:   Left Eye Distance:   Bilateral Distance:    Right Eye Near:   Left Eye Near:    Bilateral Near:     Physical Exam Vitals and nursing note reviewed.  Constitutional:      General: He is awake. He is not in acute distress.    Appearance: Normal appearance. He is well-developed and well-groomed. He is not ill-appearing.   Skin:    General: Skin is warm and dry.     Findings: Abscess present.     Comments: Approximately 5 cm fluctuant abscess noted to the right elbow with some purulent drainage and surrounding redness.   Neurological:     Mental Status: He is disoriented and confused.     GCS: GCS eye subscore is 4. GCS verbal subscore is 4. GCS motor subscore is 5.   Psychiatric:        Behavior: Behavior is cooperative.      UC Treatments / Results  Labs (all labs ordered are listed, but only abnormal results are displayed) Labs Reviewed - No data to display  EKG   Radiology No results found.  Procedures Procedures (including critical care time)  Medications Ordered in UC Medications - No data to display  Initial Impression / Assessment and Plan / UC Course  I have reviewed the triage vital signs and the nursing notes.  Pertinent labs & imaging results that were  available during my care of the patient were reviewed by me and considered in my medical decision making (see chart for details).     Patient is well-appearing but is mildly disoriented and confused.  GCS 13 with slightly delayed verbal response.  There is an approximately 5 cm fluctuant abscess noted to the right elbow with some purulent drainage and surrounding redness.  Recommended patient be seen in the emergency department for further evaluation due to new onset of altered mental status with obvious source of infection.  Discussed patient case with Dr. Barbar Bonus in clinic who agreed that patient is appropriate to be sent to the emergency department for further evaluation. Patient and daughter are agreeable to plan at this time.  Patient is stable to arrive to the ER via POV with daughter. Final Clinical Impressions(s) / UC Diagnoses   Final diagnoses:  Abscess  Altered mental status, unspecified altered mental status type  Syncope, unspecified syncope type     Discharge Instructions      Please take him to the emergency department for further evaluation.    ED Prescriptions   None    PDMP not reviewed this encounter.   Levora Reas A, NP 07/29/23 [phone]

## 2023-07-29 NOTE — Progress Notes (Signed)
 VASCULAR LAB    Right upper extremity venous duplex has been performed.  See CV proc for preliminary results.   Kathey Simer, RVT 07/29/2023, 6:46 PM

## 2023-07-29 NOTE — Discharge Instructions (Addendum)
 Please follow-up with your primary care provider in the office this week.  Your ultrasound did not show signs of blood clot, and we do not see any signs of blood infection or sepsis on your workup in the ED.  You do have an abscess or infection of your elbow.  This was opened up in the ER and the pus was expressed out, you were given antibiotics for 10 days.  Please take these as prescribed.

## 2023-07-30 ENCOUNTER — Encounter: Payer: Self-pay | Admitting: Student

## 2023-07-30 ENCOUNTER — Ambulatory Visit (INDEPENDENT_AMBULATORY_CARE_PROVIDER_SITE_OTHER): Admitting: Student

## 2023-07-30 ENCOUNTER — Ambulatory Visit: Attending: Family Medicine

## 2023-07-30 VITALS — BP 97/68 | HR 86 | Temp 98.4°F | Wt 166.0 lb

## 2023-07-30 DIAGNOSIS — L089 Local infection of the skin and subcutaneous tissue, unspecified: Secondary | ICD-10-CM

## 2023-07-30 DIAGNOSIS — R55 Syncope and collapse: Secondary | ICD-10-CM | POA: Diagnosis not present

## 2023-07-30 DIAGNOSIS — S50311D Abrasion of right elbow, subsequent encounter: Secondary | ICD-10-CM | POA: Diagnosis not present

## 2023-07-30 NOTE — Progress Notes (Signed)
 SUBJECTIVE:   CHIEF COMPLAINT / HPI: Hospital FU  Hospitalization 6/12 to 6/13 for syncopal events-thought to be due to taking Lasix  daily instead of as needed.  Echocardiogram within normal limits.  No specific follow-up recommendations and discharge summary.  Went to urgent care yesterday 6/16 for a wound check after he hit his elbow on a dresser about 1 month ago and the area has not healed well since also told urgent care that they believe that he has been more altered and confused over the past few days.  Seen in ED yesterday 6/16 for abscess of right elbow and altered mental status-doxycycline was prescribed for elbow.  On right elbow x-ray impression included olecranon soft tissue swelling thought to be related to her olecranon bursitis  Discussed the use of AI scribe software for clinical note transcription with the patient, who gave verbal consent to proceed.  History of Present Illness Joel Donaldson is an 83 year old male who presents with episodes of passing out and elbow pain. He is accompanied by his daughter  He experiences episodes of syncope since his recent hospital discharge, feeling dizzy, warm and lightheaded before these episodes, which occur both when standing and sitting. No preceding events before. He feels hot and sometimes confused afterwards. He has passed out twice since discharge (a few seconds per daughter), once on Friday and once on Saturday. There is no chest pain or palpitations before the episodes.  He has weight loss and changes in eating habits, eating but not swallowing properly, often 'packing' food in his mouth per daughter  He has elbow pain following an injury about a month ago, with drainage of pus. He was started on doxycycline at urgent care and has taken two doses so far. No fevers or vomiting.  He has become increasingly agitated and is not engaging in activities he used to enjoy. He prefers his caregiver to assist him with personal care  tasks.   PERTINENT  PMH / PSH: CAD, COPD, GERD, RA  OBJECTIVE:   BP 97/68   Pulse 86   Temp 98.4 F (36.9 C) (Axillary)   Wt 166 lb (75.3 kg)   SpO2 100%   BMI 23.82 kg/m   General: Well appearing, NAD, awake, alert, responsive to questions Head: Normocephalic atraumatic CV: Regular rate and rhythm no murmurs rubs or gallops Respiratory: Clear to ausculation bilaterally, no wheezes rales or crackles, chest rises symmetrically,  no increased work of breathing Extremities: Moves upper and lower extremities freely Skin: purulent drainage of right elbow, some tenderness to palpation   ASSESSMENT/PLAN:   Assessment & Plan Syncope, unspecified syncope type Episodes of syncope post-discharge with pre-syncope symptoms, no chest pain or palpitations. Differential includes vasovagal syncope, orthostatics, possible seizure. Did have positive orthostatic vitals initially in ED which resolved during admission. - Zio patch - Lasix  PRN not daily - Neurology referral for consideration of EEG - Provided emergency department return precautions Infected abrasion of right elbow, subsequent encounter Infected elbow with purulent drainage, being treated with doxycycline. No signs of systemic sxs. - Continue doxycycline as prescribed. - Rewrap the elbow with moist gauze and bandage, provide supplies - Schedule follow-up Friday reassess the infection    Weight loss and eating difficulties Weight loss and eating difficulties possibly related to memory impairment. Referral to geriatric clinic declined. 178 February to 166 today - Monitor weight and nutritional intake. - Discuss alternative support options with family and monitor at future visits  Genora Kidd, MD [facility]

## 2023-07-30 NOTE — Assessment & Plan Note (Signed)
 Episodes of syncope post-discharge with pre-syncope symptoms, no chest pain or palpitations. Differential includes vasovagal syncope, orthostatics, possible seizure. Did have positive orthostatic vitals initially in ED which resolved during admission. - Zio patch - Lasix  PRN not daily - Neurology referral for consideration of EEG - Provided emergency department return precautions

## 2023-07-30 NOTE — Patient Instructions (Signed)
 It was great to see you! Thank you for allowing me to participate in your care!   Our plans for today:  - I am going to order a zio patch for evaluation, I recommend cardiology follow up - I am going to refer to neurology for consideration of seizures - Return to ER if having more frequent events, chest pain, unresponsiveness - Continue doxycycline-return to ED if having fevers, vomiting - Please follow-up for wound check on Friday for evaluation of your elbow  Take care and seek immediate care sooner if you develop any concerns.  Genora Kidd, MD

## 2023-07-30 NOTE — Progress Notes (Unsigned)
 EP to read.

## 2023-07-31 DIAGNOSIS — Z79899 Other long term (current) drug therapy: Secondary | ICD-10-CM | POA: Diagnosis not present

## 2023-08-01 DIAGNOSIS — M170 Bilateral primary osteoarthritis of knee: Secondary | ICD-10-CM | POA: Diagnosis not present

## 2023-08-02 ENCOUNTER — Ambulatory Visit: Attending: Cardiology

## 2023-08-08 DIAGNOSIS — M170 Bilateral primary osteoarthritis of knee: Secondary | ICD-10-CM | POA: Diagnosis not present

## 2023-08-09 ENCOUNTER — Ambulatory Visit (INDEPENDENT_AMBULATORY_CARE_PROVIDER_SITE_OTHER): Admitting: Student

## 2023-08-09 ENCOUNTER — Encounter: Payer: Self-pay | Admitting: Student

## 2023-08-09 VITALS — BP 116/79 | HR 91 | Temp 98.4°F | Ht 70.0 in | Wt 164.1 lb

## 2023-08-09 DIAGNOSIS — S50311D Abrasion of right elbow, subsequent encounter: Secondary | ICD-10-CM

## 2023-08-09 DIAGNOSIS — L089 Local infection of the skin and subcutaneous tissue, unspecified: Secondary | ICD-10-CM

## 2023-08-09 DIAGNOSIS — R55 Syncope and collapse: Secondary | ICD-10-CM | POA: Diagnosis not present

## 2023-08-09 NOTE — Assessment & Plan Note (Signed)
 No recent syncope episodes.Zio patch monitor applied today in office for 14 days to capture potential episodes or symptoms. -Advised patient on the instructions for Zio patch and had a send back in

## 2023-08-09 NOTE — Patient Instructions (Signed)
 It was great to see you! Thank you for allowing me to participate in your care!   Our plans for today:  - Your elbow looks great! - Keep zio patch on for 2 weeks and return it in mail  Take care and seek immediate care sooner if you develop any concerns.  Wendel Lesch, MD

## 2023-08-09 NOTE — Progress Notes (Signed)
    SUBJECTIVE:   CHIEF COMPLAINT / HPI: Wound follow up  Discussed the use of AI scribe software for clinical note transcription with the patient, who gave verbal consent to proceed.  History of Present Illness Joel Donaldson is an 83 year old male who presents for follow-up regarding his elbow and monitoring of cardiac symptoms.  His elbow has healed well with no pain, fever, or drainage. He has completed his course of antibiotics with no signs of infection. He experiences no palpitations or recent episodes of syncope but needs help with Zio patch administration   PERTINENT  PMH / PSH: COPD, CAD, HTN  OBJECTIVE:   BP 116/79   Pulse 91   Temp 98.4 F (36.9 C) (Oral)   Ht 5' 10 (1.778 m)   Wt 164 lb 2 oz (74.4 kg)   SpO2 100%   BMI 23.55 kg/m   General: Well appearing, NAD, awake, alert, responsive to questions Head: Normocephalic atraumatic Respiratory: chest rises symmetrically,  no increased work of breathing Extremities: Elbow well-healed from previous wound, no fluctuance or tenderness to palpation, no warmth   ASSESSMENT/PLAN:   Assessment & Plan Infected abrasion of right elbow, subsequent encounter Well-healed s/p doxycycline  course. - Monitor Syncope, unspecified syncope type No recent syncope episodes.Zio patch monitor applied today in office for 14 days to capture potential episodes or symptoms. -Advised patient on the instructions for Zio patch and had a send back in   Wendel Lesch, MD [facility]

## 2023-08-13 DIAGNOSIS — K219 Gastro-esophageal reflux disease without esophagitis: Secondary | ICD-10-CM | POA: Diagnosis not present

## 2023-08-13 DIAGNOSIS — J449 Chronic obstructive pulmonary disease, unspecified: Secondary | ICD-10-CM | POA: Diagnosis not present

## 2023-08-13 DIAGNOSIS — I209 Angina pectoris, unspecified: Secondary | ICD-10-CM | POA: Diagnosis not present

## 2023-08-19 ENCOUNTER — Ambulatory Visit: Payer: 59

## 2023-08-19 VITALS — Ht 64.0 in | Wt 167.0 lb

## 2023-08-19 DIAGNOSIS — Z0000 Encounter for general adult medical examination without abnormal findings: Secondary | ICD-10-CM | POA: Diagnosis not present

## 2023-08-19 NOTE — Progress Notes (Signed)
 Because this visit was a virtual/telehealth visit,  certain criteria was not obtained, such a blood pressure, CBG if applicable, and timed get up and go. Any medications not marked as taking were not mentioned during the medication reconciliation part of the visit. Any vitals not documented were not able to be obtained due to this being a telehealth visit or patient was unable to self-report a recent blood pressure reading due to a lack of equipment at home via telehealth. Vitals that have been documented are verbally provided by the patient.   Subjective:   Joel Donaldson is a 83 y.o. who presents for a Medicare Wellness preventive visit.  As a reminder, Annual Wellness Visits don't include a physical exam, and some assessments may be limited, especially if this visit is performed virtually. We may recommend an in-person follow-up visit with your provider if needed.  Visit Complete: Virtual I connected with  Joel Donaldson on 08/19/23 by a audio enabled telemedicine application and verified that I am speaking with the correct person using two identifiers.  Patient Location: Home  Provider Location: Home Office  I discussed the limitations of evaluation and management by telemedicine. The patient expressed understanding and agreed to proceed.  Vital Signs: Because this visit was a virtual/telehealth visit, some criteria may be missing or patient reported. Any vitals not documented were not able to be obtained and vitals that have been documented are patient reported.  VideoDeclined- This patient declined Librarian, academic. Therefore the visit was completed with audio only.  Persons Participating in Visit: Patient.  AWV Questionnaire: No: Patient Medicare AWV questionnaire was not completed prior to this visit.  Cardiac Risk Factors include: advanced age (>60men, >23 women);sedentary lifestyle;dyslipidemia;family history of premature cardiovascular  disease;hypertension;male gender     Objective:    Today's Vitals   08/19/23 0834  Weight: 167 lb (75.8 kg)  Height: 5' 4 (1.626 m)  PainSc: 6   PainLoc: Generalized   Body mass index is 28.67 kg/m.     08/19/2023    8:36 AM 08/09/2023   10:09 AM 07/29/2023    2:51 PM 07/25/2023    6:00 PM 07/25/2023   12:55 PM 07/24/2023    9:17 AM 06/15/2023    8:05 AM  Advanced Directives  Does Patient Have a Medical Advance Directive? No No No No Yes No No  Would patient like information on creating a medical advance directive? No - Patient declined No - Patient declined No - Patient declined No - Patient declined  No - Patient declined     Current Medications (verified) Outpatient Encounter Medications as of 08/19/2023  Medication Sig   albuterol  (VENTOLIN  HFA) 108 (90 Base) MCG/ACT inhaler INHALE 1 TO 2 PUFFS INTO THE LUNGS EVERY 4 HOURS AS NEEDED FOR WHEEZING OR SHORTNESS OF BREATH   aspirin  EC 81 MG tablet Take 81 mg by mouth every morning.    atorvastatin  (LIPITOR) 20 MG tablet TAKE 1 TABLET(20 MG) BY MOUTH DAILY   buprenorphine  (BUTRANS ) 10 MCG/HR PTWK Place 1 patch onto the skin every Friday.   diclofenac  Sodium (VOLTAREN ) 1 % GEL Apply 4 g topically 4 (four) times daily. (Patient taking differently: Apply 4 g topically 4 (four) times daily as needed (for pain- affected areas).)   fluticasone  (FLONASE ) 50 MCG/ACT nasal spray Place 2 sprays into both nostrils daily.   furosemide  (LASIX ) 40 MG tablet Take 1 tablet (40 mg total) by mouth daily as needed for edema.   isosorbide  mononitrate (IMDUR )  30 MG 24 hr tablet Take 0.5 tablets (15 mg total) by mouth daily. (Patient taking differently: Take 30 mg by mouth daily.)   lidocaine  (XYLOCAINE ) 5 % ointment Apply 1 application topically daily as needed for mild pain or moderate pain.   loratadine  (CLARITIN ) 10 MG tablet Take 1 tablet (10 mg total) by mouth daily. (Patient taking differently: Take 10 mg by mouth daily as needed for allergies or  rhinitis.)   Multiple Vitamins-Minerals (ONE-A-DAY PROACTIVE 65+) TABS Take 1 tablet by mouth daily with breakfast.   nitroGLYCERIN  (NITROSTAT ) 0.4 MG SL tablet Place 1 tablet (0.4 mg total) under the tongue every 5 (five) minutes as needed for chest pain (chest pain).   omeprazole  (PRILOSEC) 40 MG capsule TAKE 1 CAPSULE(40 MG) BY MOUTH DAILY BEFORE BREAKFAST   ondansetron  (ZOFRAN -ODT) 8 MG disintegrating tablet Take 1 tablet (8 mg total) by mouth every 8 (eight) hours as needed for nausea or vomiting. (Patient taking differently: Take 8 mg by mouth every 8 (eight) hours as needed for nausea or vomiting (dissolve orally).)   oxyCODONE -acetaminophen  (PERCOCET) 10-325 MG tablet Take 1 tablet by mouth 4 (four) times daily as needed for pain.   polyethylene glycol (MIRALAX  / GLYCOLAX ) 17 g packet Take 17 g by mouth daily as needed for mild constipation.   ranolazine  (RANEXA ) 500 MG 12 hr tablet Take 1 tablet (500 mg total) by mouth 2 (two) times daily.   SYSTANE ULTRA PF 0.4-0.3 % SOLN Place 1 drop into both eyes 3 (three) times daily as needed (for dryness).   Tiotropium Bromide -Olodaterol (STIOLTO RESPIMAT ) 2.5-2.5 MCG/ACT AERS INHALE 2 PUFFS INTO THE LUNGS DAILY   TYLENOL  8 HOUR ARTHRITIS PAIN 650 MG CR tablet Take 650 mg by mouth every 8 (eight) hours as needed for pain.   No facility-administered encounter medications on file as of 08/19/2023.    Allergies (verified) Tramadol  and Pollen extract   History: Past Medical History:  Diagnosis Date   Acute nonintractable headache 05/31/2021   Alcohol abuse    Anxiety    Arthritis    hands, back, knees   Asthma    Atypical angina (HCC) (Class III) 12/19/2011   Baker's cyst of knee, right 06/21/2016   Chest pain at rest 10/04/2016   Depression    Epigastric pain 05/20/2020   Fall 10/23/2021   GERD (gastroesophageal reflux disease)    Glaucoma    Heart murmur asymptomatic   Hx of radioisotope therapy 07/26/2011   Radiosactive Prostate Seed  Implant/ I-125 Seeds   Hypertension    Neck pain 10/22/2019   Prostate cancer (HCC) dx'd 2016 or 2017   seed implants   Rheumatoid arthritis (HCC) 10/10/2012   Past Surgical History:  Procedure Laterality Date   CYSTOSCOPY  09/14/2011   Procedure: CYSTOSCOPY FLEXIBLE;  Surgeon: Thomasine Oiler, MD;  Location: [facility] [place];  Service: Urology;  Laterality: N/A;  No seeds seen in the bladder   LEFT HEART CATH AND CORONARY ANGIOGRAPHY N/A 09/16/2020   Procedure: LEFT HEART CATH AND CORONARY ANGIOGRAPHY;  Surgeon: Anner Alm ORN, MD;  Location: [facility] CV LAB;  Service: Cardiovascular;  Laterality: N/A;   LUMBAR FUSION  1995   RADIOACTIVE SEED IMPLANT  09/14/2011   Procedure: RADIOACTIVE SEED IMPLANT;  Surgeon: Thomasine Oiler, MD;  Location: [facility] [place];  Service: Urology;  Laterality: N/A;  Total number of seeds -     SHOULDER SURGERY  YRS AGO   LEFT   Family History  Problem Relation Age  of Onset   Diabetes Mother    Heart disease Father        lived to 93 - had bypass, pacemaker   Social History   Socioeconomic History   Marital status: Legally Separated    Spouse name: Zelda   Number of children: 1   Years of education: 11   Highest education level: Not on file  Occupational History   Occupation: Retired    Associate Professor: RETIRED  Tobacco Use   Smoking status: Former    Current packs/day: 0.00    Average packs/day: 2.0 packs/day for 51.7 years (103.3 ttl pk-yrs)    Types: Cigarettes    Start date: 02/12/1953    Quit date: 10/13/2004    Years since quitting: 18.8    Passive exposure: Past   Smokeless tobacco: Never   Tobacco comments:    Smoked 2 ppd  Vaping Use   Vaping status: Never Used  Substance and Sexual Activity   Alcohol use: Not Currently    Alcohol/week: 14.0 standard drinks of alcohol    Types: 14 Shots of liquor per week    Comment: 1/2 pint of liquor daily   Drug use: No   Sexual activity: Not Currently  Other Topics Concern    Not on file  Social History Narrative   Patient lives with his granddaughter in [place].   Patient is currently legally separated.    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    Patient enjoys seeing friends, hunting, and fishing.    Receives aide services 7 days week          Social Drivers of [employer]: Low Risk  (08/19/2023)   Overall Financial Resource Strain (CARDIA)    Difficulty of Paying Living Expenses: Not hard at all  Food Insecurity: No Food Insecurity (08/19/2023)   Hunger Vital Sign    Worried About Running Out of Food in the Last Year: Never true    Ran Out of Food in the Last Year: Never true  Transportation Needs: No Transportation Needs (08/19/2023)   PRAPARE - Administrator, Civil Service (Medical): No    Lack of Transportation (Non-Medical): No  Physical Activity: Insufficiently Active (08/19/2023)   Exercise Vital Sign    Days of Exercise per Week: 3 days    Minutes of Exercise per Session: 30 min  Stress: No Stress Concern Present (08/19/2023)   [employer] of Occupational Health - Occupational Stress Questionnaire    Feeling of Stress: Not at all  Social Connections: Moderately Integrated (08/19/2023)   Social Connection and Isolation Panel    Frequency of Communication with Friends and Family: More than three times a week    Frequency of Social Gatherings with Friends and Family: More than three times a week    Attends Religious Services: 1 to 4 times per year    Active Member of [employer] or Organizations: No    Attends Banker Meetings: Never    Marital Status: Married  Recent Concern: Social Connections - Moderately Isolated (05/21/2023)   Social Connection and Isolation Panel    Frequency of Communication with Friends and Family: More than three times a week    Frequency of Social Gatherings with Friends and Family: More than three times a week    Attends Religious Services: More  than 4 times per year    Active Member of [employer] or Organizations: No    Attends Banker Meetings: Never  Marital Status: Separated    Tobacco Counseling Counseling given: Not Answered Tobacco comments: Smoked 2 ppd    Clinical Intake:  Pre-visit preparation completed: Yes  Pain : 0-10 Pain Score: 6  Pain Type: Chronic pain Pain Location: Generalized Pain Orientation: Right, Left (KNEES AND SHOULDERS)     BMI - recorded: 28.67 Nutritional Status: BMI 25 -29 Overweight Nutritional Risks: None Diabetes: No  Lab Results  Component Value Date   HGBA1C 5.8 (H) 04/16/2022   HGBA1C 5.6 05/21/2020   HGBA1C 5.3 03/11/2018     How often do you need to have someone help you when you read instructions, pamphlets, or other written materials from your doctor or pharmacy?: 1 - Never  Interpreter Needed?: No  Information entered by :: Joel Fuller, LPN.   Activities of Daily Living     08/19/2023    8:38 AM 07/25/2023    6:00 PM  In your present state of health, do you have any difficulty performing the following activities:  Hearing? 0 1  Vision? 0 0  Difficulty concentrating or making decisions? 0 0  Walking or climbing stairs? 0   Dressing or bathing? 0   Doing errands, shopping? 0 1  Preparing Food and eating ? N   Using the Toilet? N   In the past six months, have you accidently leaked urine? N   Do you have problems with loss of bowel control? N   Managing your Medications? N   Managing your Finances? N   Housekeeping or managing your Housekeeping? N     Patient Care Team: Adele Song, MD as PCP - General (Family Medicine) Croitoru, Jerel, MD as PCP - Cardiology (Cardiology) Octavia Charleston, MD (Ophthalmology) Dr. Darlina (Rheumatology) Dr. Darrel (Oncology)  I have updated your Care Teams any recent Medical Services you may have received from other providers in the past year.     Assessment:   This is a routine wellness examination for  Joel Donaldson.  Hearing/Vision screen Hearing Screening - Comments:: Denies hearing difficulties.  Vision Screening - Comments:: Wears rx glasses - up to date with routine eye exams with [facility]    Goals Addressed             This Visit's Progress    08/19/23: To stay independent as possible.         Depression Screen     08/19/2023    8:37 AM 08/09/2023   10:09 AM 07/24/2023    9:17 AM 06/13/2023    4:05 PM 05/30/2023    1:29 PM 05/14/2023    1:36 PM 05/14/2023    1:24 PM  PHQ 2/9 Scores  PHQ - 2 Score 0  1 0 1 0 6  PHQ- 9 Score 1   0 1 0 23  Exception Documentation  Patient refusal         Fall Risk     08/19/2023    8:37 AM 08/09/2023   10:09 AM 07/24/2023    9:17 AM 06/13/2023    4:05 PM 06/13/2023    4:01 PM  Fall Risk   Falls in the past year? 1 0 0 0 0  Number falls in past yr: 1 0 0 0 0  Injury with Fall? 1 0 0 0 0  Risk for fall due to : History of fall(s);Impaired balance/gait;Orthopedic patient      Follow up Falls evaluation completed;Education provided        MEDICARE RISK AT HOME:  Medicare Risk at  Home Any stairs in or around the home?: No If so, are there any without handrails?: No Home free of loose throw rugs in walkways, pet beds, electrical cords, etc?: Yes Adequate lighting in your home to reduce risk of falls?: Yes Life alert?: No Use of a cane, walker or w/c?: Yes Grab bars in the bathroom?: Yes Shower chair or bench in shower?: Yes Elevated toilet seat or a handicapped toilet?: Yes  TIMED UP AND GO:  Was the test performed?  No  Cognitive Function: 6CIT completed    08/19/2023    8:38 AM 05/03/2011   11:00 AM  MMSE - Mini Mental State Exam  Not completed: Unable to complete   Orientation to time  5   Orientation to Place  5   Registration  3   Attention/ Calculation  0   Recall  3   Language- name 2 objects  2   Language- repeat  1  Language- follow 3 step command  3   Language- read & follow direction  0   Write a sentence  0    Copy design  0   Total score  22      Data saved with a previous flowsheet row definition        08/19/2023    8:39 AM 08/13/2022    4:04 PM 05/24/2020    9:16 AM  6CIT Screen  What Year? 0 points 0 points 0 points  What month? 0 points 0 points 0 points  What time? 0 points 0 points 0 points  Count back from 20 0 points 0 points 0 points  Months in reverse 0 points 2 points 2 points  Repeat phrase 0 points 0 points 0 points  Total Score 0 points 2 points 2 points    Immunizations Immunization History  Administered Date(s) Administered   Fluad Quad(high Dose 65+) 11/24/2018, 10/22/2019   Influenza Split 12/08/2010, 12/03/2011   Influenza Whole 11/13/2012   Influenza, High Dose Seasonal PF 11/25/2013   Influenza,inj,Quad PF,6+ Mos 03/16/2016, 11/02/2016   Influenza,inj,quad, With Preservative 12/02/2020   Influenza-Unspecified 02/12/2019, 11/10/2022   PFIZER Comirnaty(Gray Top)Covid-19 Tri-Sucrose Vaccine 06/11/2020   PFIZER(Purple Top)SARS-COV-2 Vaccination 04/08/2019, 04/29/2019, 11/10/2019   Pfizer(Comirnaty)Fall Seasonal Vaccine 12 years and older 11/10/2022   Pneumococcal Conjugate-13 07/03/2013, 12/01/2020   Pneumococcal Polysaccharide-23 10/13/2005   Td 01/13/2003   Td (Adult), 2 Lf Tetanus Toxid, Preservative Free 01/13/2003   Tdap 03/17/2015   Zoster, Live 07/02/2014    Screening Tests Health Maintenance  Topic Date Due   Zoster Vaccines- Shingrix (1 of 2) 04/29/1959   COVID-19 Vaccine (6 - Pfizer risk 2024-25 season) 05/10/2023   INFLUENZA VACCINE  09/13/2023   Medicare Annual Wellness (AWV)  08/18/2024   DTaP/Tdap/Td (4 - Td or Tdap) 03/16/2025   Pneumococcal Vaccine: 50+ Years  Completed   Hepatitis B Vaccines  Aged Out   HPV VACCINES  Aged Out   Meningococcal B Vaccine  Aged Out    Health Maintenance  Health Maintenance Due  Topic Date Due   Zoster Vaccines- Shingrix (1 of 2) 04/29/1959   COVID-19 Vaccine (6 - Pfizer risk 2024-25 season)  05/10/2023   Health Maintenance Items Addressed: Yes Patient aware of current care gaps.  Immunization record was verified by [employer].  Patient is due for a Covid and Shingrix vaccines.  Additional Screening:  Vision Screening: Recommended annual ophthalmology exams for early detection of glaucoma and other disorders of the eye. Would you like a referral to an eye  doctor? No    Dental Screening: Recommended annual dental exams for proper oral hygiene  Community Resource Referral / Chronic Care Management: CRR required this visit?  No   CCM required this visit?  No   Plan:    I have personally reviewed and noted the following in the patient's chart:   Medical and social history Use of alcohol, tobacco or illicit drugs  Current medications and supplements including opioid prescriptions. Patient is currently taking opioid prescriptions. Information provided to patient regarding non-opioid alternatives. Patient advised to discuss non-opioid treatment plan with their provider. Functional ability and status Nutritional status Physical activity Advanced directives List of other physicians Hospitalizations, surgeries, and ER visits in previous 12 months Vitals Screenings to include cognitive, depression, and falls Referrals and appointments  In addition, I have reviewed and discussed with patient certain preventive protocols, quality metrics, and best practice recommendations. A written personalized care plan for preventive services as well as general preventive health recommendations were provided to patient.   Joel LOISE Fuller, LPN   03/16/7972   After Visit Summary: (Declined) Due to this being a telephonic visit, with patients personalized plan was offered to patient but patient Declined AVS at this time   Notes: Patient aware of current care gaps.  Immunization record was verified by [employer].  Patient is due for a Covid and Shingrix vaccines.

## 2023-08-19 NOTE — Patient Instructions (Signed)
 [name] , Thank you for taking time out of your busy schedule to complete your Annual Wellness Visit with me. I enjoyed our conversation and look forward to speaking with you again next year. I, as well as your care team,  appreciate your ongoing commitment to your health goals. Please review the following plan we discussed and let me know if I can assist you in the future. Your Game plan/ To Do List    Referrals: If you haven't heard from the office you've been referred to, please reach out to them at the phone provided.   Follow up Visits: Next Medicare AWV with our clinical staff: 7/9/206 at 8:30 a.m. phone visit with Nurse Health Advisor   Have you seen your provider in the last 6 months (3 months if uncontrolled diabetes)? Yes Next Office Visit with your provider: Patient will call to schedule as needed.  Clinician Recommendations:  Aim for 30 minutes of exercise or brisk walking, 6-8 glasses of water , and 5 servings of fruits and vegetables each day.       This is a list of the screening recommended for you and due dates:  Health Maintenance  Topic Date Due   Zoster (Shingles) Vaccine (1 of 2) 04[birth year]29/1958   COVID-19 Vaccine (6 - Pfizer risk 2024-25 season) 05/10/2023   Flu Shot  09/13/2023   Medicare Annual Wellness Visit  08/18/2024   DTaP/Tdap/Td vaccine (4 - Td or Tdap) 03/16/2025   Pneumococcal Vaccine for age over 81  Completed   Hepatitis B Vaccine  Aged Out   HPV Vaccine  Aged Out   Meningitis B Vaccine  Aged Out    Advanced directives: (Declined) Advance directive discussed with you today. Even though you declined this today, please call our office should you change your mind, and we can give you the proper paperwork for you to fill out. Advance Care Planning is important because it:  [x]  Makes sure you receive the medical care that is consistent with your values, goals, and preferences  [x]  It provides guidance to your family and loved ones and reduces their  decisional burden about whether or not they are making the right decisions based on your wishes.  Follow the link provided in your after visit summary or read over the paperwork we have mailed to you to help you started getting your Advance Directives in place. If you need assistance in completing these, please reach out to us  so that we can help you!  See attachments for Preventive Care and Fall Prevention Tips.

## 2023-08-20 ENCOUNTER — Encounter: Payer: Self-pay | Admitting: Neurology

## 2023-08-22 NOTE — Progress Notes (Signed)
 I contacted the patient but he did not answer. I could not leave a voice mail for him due to his voice mail being full.

## 2023-08-26 DIAGNOSIS — E559 Vitamin D deficiency, unspecified: Secondary | ICD-10-CM | POA: Diagnosis not present

## 2023-08-26 DIAGNOSIS — G8929 Other chronic pain: Secondary | ICD-10-CM | POA: Diagnosis not present

## 2023-08-26 DIAGNOSIS — M25562 Pain in left knee: Secondary | ICD-10-CM | POA: Diagnosis not present

## 2023-08-26 DIAGNOSIS — Z79899 Other long term (current) drug therapy: Secondary | ICD-10-CM | POA: Diagnosis not present

## 2023-08-26 DIAGNOSIS — R7303 Prediabetes: Secondary | ICD-10-CM | POA: Diagnosis not present

## 2023-08-26 DIAGNOSIS — Z9181 History of falling: Secondary | ICD-10-CM | POA: Diagnosis not present

## 2023-08-26 DIAGNOSIS — M25561 Pain in right knee: Secondary | ICD-10-CM | POA: Diagnosis not present

## 2023-08-26 DIAGNOSIS — M549 Dorsalgia, unspecified: Secondary | ICD-10-CM | POA: Diagnosis not present

## 2023-08-28 DIAGNOSIS — R55 Syncope and collapse: Secondary | ICD-10-CM | POA: Diagnosis not present

## 2023-08-28 DIAGNOSIS — Z79899 Other long term (current) drug therapy: Secondary | ICD-10-CM | POA: Diagnosis not present

## 2023-08-31 DIAGNOSIS — R55 Syncope and collapse: Secondary | ICD-10-CM | POA: Diagnosis not present

## 2023-09-03 ENCOUNTER — Ambulatory Visit: Payer: Self-pay

## 2023-09-03 NOTE — Telephone Encounter (Signed)
-----   Message from [facility] sent at 09/03/2023  8:43 AM EDT ----- Regarding: Schedule fu appt Please schedule pt with me for PCP fu appt to discuss cardiac monitoring results. Thanks! ----- Message ----- From: Anders Otto DASEN, MD Sent: 09/02/2023   7:11 AM EDT To: Rea Raring, MD  Ordered by previous PCP. Follow up with patient to discuss result and management plan. Review why it was ordered and have CMA schedule PCP follow up with you to discuss management plan.  ----- Message ----- From: Raring Rea, MD Sent: 09/02/2023   7:04 AM EDT To: Otto DASEN Anders, MD  Hi Dr Anders,  Is there anything I need to do with this?   Thanks, Leah ----- Message ----- From: Anders Otto DASEN, MD Sent: 08/31/2023   2:46 PM EDT To: Rea Raring, MD   ----- Message ----- From: Cindie Ole DASEN, MD Sent: 08/31/2023   1:57 PM EDT To: Otto DASEN Anders, MD

## 2023-09-03 NOTE — Telephone Encounter (Signed)
 Spoke with patient. Made follow up with PCP for Aug 6th at 9:20. Nelson Land, CMA

## 2023-09-05 ENCOUNTER — Encounter: Payer: Self-pay | Admitting: Pulmonary Disease

## 2023-09-05 ENCOUNTER — Ambulatory Visit: Admitting: Pulmonary Disease

## 2023-09-05 ENCOUNTER — Other Ambulatory Visit: Payer: Self-pay

## 2023-09-05 MED ORDER — OMEPRAZOLE 40 MG PO CPDR: 40.0000 mg | DELAYED_RELEASE_CAPSULE | Freq: Every day | ORAL | 0 refills | Status: DC

## 2023-09-10 ENCOUNTER — Ambulatory Visit: Attending: Internal Medicine | Admitting: Cardiovascular Disease

## 2023-09-11 ENCOUNTER — Encounter: Payer: Self-pay | Admitting: Podiatry

## 2023-09-11 ENCOUNTER — Ambulatory Visit (INDEPENDENT_AMBULATORY_CARE_PROVIDER_SITE_OTHER): Admitting: Podiatry

## 2023-09-11 DIAGNOSIS — M79674 Pain in right toe(s): Secondary | ICD-10-CM

## 2023-09-11 DIAGNOSIS — B351 Tinea unguium: Secondary | ICD-10-CM

## 2023-09-11 DIAGNOSIS — M79675 Pain in left toe(s): Secondary | ICD-10-CM | POA: Diagnosis not present

## 2023-09-11 NOTE — Progress Notes (Signed)
 This patient presents to the office with chief complaint of long thick painful nails.  Patient says the nails are painful walking and wearing shoes.  This patient is unable to self treat.  This patient is unable to trim hisnails since he is unable to reach his nails.  he presents to the office for preventative foot care services.  General Appearance  Alert, conversant and in no acute stress.  Vascular  Dorsalis pedis and posterior tibial  pulses are palpable  bilaterally.  Capillary return is within normal limits  bilaterally. Temperature is within normal limits  bilaterally.  Neurologic  Senn-Weinstein monofilament wire test within normal limits  bilaterally. Muscle power within normal limits bilaterally.  Nails Thick disfigured discolored nails with subungual debris  from hallux to fifth toes bilaterally. No evidence of bacterial infection or drainage bilaterally.  Orthopedic  No limitations of motion  feet .  No crepitus or effusions noted.  No bony pathology or digital deformities noted.  Skin  normotropic skin with no porokeratosis noted bilaterally.  No signs of infections or ulcers noted.     Onychomycosis  Nails  B/L.  Pain in right toes  Pain in left toes  Debridement of nails both feet followed trimming the nails with dremel tool.    RTC 3 months.   Cordella Shadrach Bartunek/SM

## 2023-09-12 ENCOUNTER — Encounter: Payer: Self-pay | Admitting: Cardiovascular Disease

## 2023-09-18 ENCOUNTER — Encounter: Payer: Self-pay | Admitting: Family Medicine

## 2023-09-18 ENCOUNTER — Ambulatory Visit: Admitting: Family Medicine

## 2023-09-18 VITALS — BP 127/75 | HR 80 | Ht 70.0 in | Wt 169.2 lb

## 2023-09-18 DIAGNOSIS — I471 Supraventricular tachycardia, unspecified: Secondary | ICD-10-CM

## 2023-09-18 DIAGNOSIS — R55 Syncope and collapse: Secondary | ICD-10-CM | POA: Diagnosis not present

## 2023-09-18 DIAGNOSIS — M19011 Primary osteoarthritis, right shoulder: Secondary | ICD-10-CM | POA: Diagnosis not present

## 2023-09-18 DIAGNOSIS — M19012 Primary osteoarthritis, left shoulder: Secondary | ICD-10-CM | POA: Diagnosis not present

## 2023-09-18 DIAGNOSIS — E441 Mild protein-calorie malnutrition: Secondary | ICD-10-CM | POA: Diagnosis not present

## 2023-09-18 DIAGNOSIS — M170 Bilateral primary osteoarthritis of knee: Secondary | ICD-10-CM

## 2023-09-18 MED ORDER — METOPROLOL SUCCINATE ER 25 MG PO TB24: 25.0000 mg | ORAL_TABLET | Freq: Every day | ORAL | 0 refills | Status: DC

## 2023-09-18 NOTE — Progress Notes (Signed)
    SUBJECTIVE:   CHIEF COMPLAINT / HPI:   F/u cardiac monitoring From visit 6/17: He experiences episodes of syncope since his recent hospital discharge, feeling dizzy, warm and lightheaded before these episodes, which occur both when standing and sitting. No preceding events before. He feels hot and sometimes confused afterwards. He has passed out twice since discharge (a few seconds per daughter), once on Friday and once on Saturday. There is no chest pain or palpitations before the episodes.   Workup included zio patch, PRN lasix  (vs daily), neuro referral for EEG  Wore Zio patch for 9 days. Showed NSR with 13 runs of SVT- longest was 10 beats, avg 111bpm. No Afib, AV block, pauses, Vfib/PVT,TdP.  Had 2-3 episodes of dizziness during time when wearing monitors. Was sitting out in the heat. Episodes lasted a few seconds and felt completely normal after.   Feels things are better overall.   Has neurology appointment scheduled for 9/11  Needs to make cardiology follow-up appointment, last seen in April  Chronic pain-osteoarthritis of knees and shoulders. Takes Percocet 10-325 4 times daily, uses buprenorphine  patch, as well as topical Voltaren  and lidocaine . Able to walk to the mailbox. Drives to appointments. Has aide to help with meals, cleaning, getting dressed. Bathes independently.   Uses miralax ,  not having issues with constipation currenlty  Weight monitoring Family reported wt loss and change in eating habits at visit in June Lost 12 lb from [employer] geri clinic referral  Med rec completed today   PERTINENT  PMH / PSH: Aortic atherosclerosis, HTN, CAD, COPD, GERD, RA, OA of bilateral knees and shoulders, history of prostate cancer, chronic opioid use  OBJECTIVE:   BP 127/75   Pulse 80   Ht 5' 10 (1.778 m)   Wt 169 lb 3.2 oz (76.7 kg)   SpO2 98%   BMI 24.28 kg/m   General: Awake and conversant, in no acute distress CV: RRR, normal S1/S2, no  M/R/G Respiratory: Normal WOB on RA, CTAB Neuro: No focal deficits MSK: Ambulates with cane Psych: Appropriate mood and affect  ASSESSMENT/PLAN:   Assessment & Plan Syncope, unspecified syncope type Ongoing workup.  ZIO results as below.  Patient has neurology appointment in September, appreciate recommendations at that time.  Appears patient is staying hydrated and eating well this time.  Syncope may be vasovagal versus hypovolemic given episodes while sitting outside in the heat.  Reassuringly appropriate vitals and exam today.  Advised cardiology follow-up as below.  Return precautions reviewed SVT (supraventricular tachycardia) (HCC) ZIO report with short runs of SVT.  Reassuringly no other arrhythmias or concerning findings.  Possible that patient is symptomatic during these episodes.  Initiating low-dose metoprolol .  Advised patient to schedule cardiology follow-up. - metoprolol  succinate (TOPROL -XL) 25 MG 24 hr tablet; Take 1 tablet (25 mg total) by mouth at bedtime.  Dispense: 90 tablet; Refill: 0 Osteoarthritis of both knees, unspecified osteoarthritis type Osteoarthritis of both shoulders, unspecified osteoarthritis type Multimodal pain regimen with Percocet, topical lidocaine  and Voltaren , buprenorphine  patch.  These medications improve patient's quality of life and assist with his ability to perform ADLs/IADLs.  Will continue to follow-up with patient regularly and adjust his pain regimen as appropriate. Mild protein malnutrition (HCC) Weight is appropriate today at 169, up from 167 in July and 164 in June.  Will continue to monitor.     Rea Raring, MD [facility]

## 2023-09-18 NOTE — Assessment & Plan Note (Signed)
 Ongoing workup.  ZIO results as below.  Patient has neurology appointment in September, appreciate recommendations at that time.  Appears patient is staying hydrated and eating well this time.  Syncope may be vasovagal versus hypovolemic given episodes while sitting outside in the heat.  Reassuringly appropriate vitals and exam today.  Advised cardiology follow-up as below.  Return precautions reviewed

## 2023-09-18 NOTE — Assessment & Plan Note (Signed)
 Multimodal pain regimen with Percocet, topical lidocaine  and Voltaren , buprenorphine  patch.  These medications improve patient's quality of life and assist with his ability to perform ADLs/IADLs.  Will continue to follow-up with patient regularly and adjust his pain regimen as appropriate.

## 2023-09-18 NOTE — Assessment & Plan Note (Signed)
 Weight is appropriate today at 169, up from 167 in July and 164 in June.  Will continue to monitor.

## 2023-09-18 NOTE — Patient Instructions (Signed)
 Thank you for coming in today! Here is a summary of what we discussed:  - Please follow-up with your cardiologist to schedule an appointment.  Their information is:  [facility]- Glendia Ferrier [address], [place], [place] [address] [phone]  - Please start taking the metoprolol  daily  - If you notice increased dizziness, heart palpitations, other symptoms please call our clinic or go to the ED for evaluation  -I will see you in mid September to follow-up  Please call the clinic at [phone] if your symptoms worsen or you have any concerns.  Best, Dr Adele

## 2023-09-30 DIAGNOSIS — G8929 Other chronic pain: Secondary | ICD-10-CM | POA: Diagnosis not present

## 2023-09-30 DIAGNOSIS — Z9181 History of falling: Secondary | ICD-10-CM | POA: Diagnosis not present

## 2023-09-30 DIAGNOSIS — E559 Vitamin D deficiency, unspecified: Secondary | ICD-10-CM | POA: Diagnosis not present

## 2023-09-30 DIAGNOSIS — M549 Dorsalgia, unspecified: Secondary | ICD-10-CM | POA: Diagnosis not present

## 2023-09-30 DIAGNOSIS — M25562 Pain in left knee: Secondary | ICD-10-CM | POA: Diagnosis not present

## 2023-09-30 DIAGNOSIS — Z79899 Other long term (current) drug therapy: Secondary | ICD-10-CM | POA: Diagnosis not present

## 2023-09-30 DIAGNOSIS — M25561 Pain in right knee: Secondary | ICD-10-CM | POA: Diagnosis not present

## 2023-09-30 DIAGNOSIS — R7303 Prediabetes: Secondary | ICD-10-CM | POA: Diagnosis not present

## 2023-10-02 DIAGNOSIS — Z79899 Other long term (current) drug therapy: Secondary | ICD-10-CM | POA: Diagnosis not present

## 2023-10-04 ENCOUNTER — Other Ambulatory Visit: Payer: Self-pay

## 2023-10-04 DIAGNOSIS — J42 Unspecified chronic bronchitis: Secondary | ICD-10-CM

## 2023-10-07 MED ORDER — STIOLTO RESPIMAT 2.5-2.5 MCG/ACT IN AERS: 2.0000 | INHALATION_SPRAY | Freq: Every day | RESPIRATORY_TRACT | 2 refills | Status: DC

## 2023-10-24 ENCOUNTER — Encounter: Payer: Self-pay | Admitting: Neurology

## 2023-10-24 ENCOUNTER — Ambulatory Visit (INDEPENDENT_AMBULATORY_CARE_PROVIDER_SITE_OTHER): Admitting: Neurology

## 2023-10-24 VITALS — Ht 70.0 in | Wt 172.0 lb

## 2023-10-24 DIAGNOSIS — I951 Orthostatic hypotension: Secondary | ICD-10-CM | POA: Diagnosis not present

## 2023-10-24 DIAGNOSIS — R404 Transient alteration of awareness: Secondary | ICD-10-CM | POA: Diagnosis not present

## 2023-10-24 NOTE — Patient Instructions (Addendum)
 Good to meet you.  Schedule open MRI brain without contrast  2. Schedule 1-hour EEG. If normal, we will do a 2-day home EEG  3. It is prudent to recommend that all persons should be free of syncopal (passing out) episodes for at least six months to be granted the driving privilege. (THE [place]  PHYSICIAN'S GUIDE TO DRIVER MEDICAL EVALUATION, Second Edition, Medical Review Branch, Associate Professor, Division of [employer], [place]  Department of Transportation, July 2004)   4. Follow-up after tests, call for any changes

## 2023-10-24 NOTE — Progress Notes (Signed)
 NEUROLOGY CONSULTATION NOTE  Joel Donaldson MRN: [id number] DOB: 1940-03-20  Referring provider: Dr. Otto Fairly Primary care provider: Dr. Rea Raring  Reason for consult:  syncope  Dear Dr Fairly:  Thank you for your kind referral of Joel Donaldson for consultation of the above symptoms. Although his history is well known to you, please allow me to reiterate it for the purpose of our medical record. The patient was accompanied to the clinic by his girlfriend Roderick (prefers [place]) who also provides collateral information. Records and images were personally reviewed where available.   HISTORY OF PRESENT ILLNESS: This is an 83 year old right-handed man with a history of hyperlipidemia, rheumatoid arthritis, GERD, prostate cancer, presenting for evaluation of recurrent syncope. His girlfriend Meda helps supplement the history. He was admitted overnight at [facility] on 07/25/23 for syncope. Per notes, he was outside and felt very hot, lightheaded, mildly diaphoretic and mildly nauseated. Workup showed normal echocardiogram, carotid ultrasound. Per notes, orthostatic vital signs remained negative. Syncope thought to be due to taking Lasix  daily instead of as needed. He was back in Urgent care on 6/16 for a poor healing wound but family also reported he was more confused for several days. On follow-up with PCP, they reported he had more syncopal episodes for a few seconds. He wore a Zio patch for 9 days with NSR and 13 runs of SVT, longest was 10 beats. He was started on metoprolol .   Meda has witnessed the episodes. The first occurred a year ago while they were at [facility], he was sitting then his body stiffened up. He snapped out of it so quickly. One time he was sitting then leaned back, eyes jumping, he grabbed on the sides of the chair with his arms flexed lifting the chair up. He was not responding for a few seconds, then suddenly he came back. He reported he could hear her  but could not respond. He states that he has gotten hot lately like he would have an episode, he sits and holds his head up, which seems to help. If he flexes his head down or gets upset, they tend to happen more. The last episode was a few weeks ago.   Meda is concerned that he tends to hold his left arm in a fist. His left hand tends to swell. He states it is from his arthritis, he has it in both shoulders and knees. She has to remind him to swallow when eating, I just put too much food in my mouth. She is concerned he has lost a lot of weight, he was previously a 2XL but now wears L. He has occasional headaches with pressure in the occipital region, no nausea/vomiting, photo/phonophobia. Tylenol  helps a little. He is sleeping fine. No recent falls. His granddaughter lives with him but they report he is her caregiver. He has an aide who comes daily but Meda reports he is spoiled and wants Meda to do everything for him instead of asking the aide, he drives to her house waiting for her to make his meals.   He had a head injury at work in his 30s over the left frontal region with loss of consciousness. He had a normal birth and early development.  There is no history of febrile convulsions, CNS infections such as meningitis/encephalitis, significant traumatic brain injury, neurosurgical procedures, or family history of seizures.   PAST MEDICAL HISTORY: Past Medical History:  Diagnosis Date   Abnormal cardiac CT angiography 09/16/2020  Acute nonintractable headache 05/31/2021   Alcohol abuse    Anxiety    Arthritis    hands, back, knees   Asthma    Atypical angina (HCC) (Class III) 12/19/2011   Baker's cyst of knee, right 06/21/2016   Chest pain at rest 10/04/2016   Depression    Epigastric pain 05/20/2020   Fall 10/23/2021   GERD (gastroesophageal reflux disease)    Glaucoma    Heart murmur asymptomatic   Hx of radioisotope therapy 07/26/2011   Radiosactive Prostate Seed Implant/  I-125 Seeds   Hypertension    Neck pain 10/22/2019   Prostate cancer (HCC) dx'd 2016 or 2017   seed implants   Rheumatoid arthritis (HCC) 10/10/2012    PAST SURGICAL HISTORY: Past Surgical History:  Procedure Laterality Date   CYSTOSCOPY  09/14/2011   Procedure: CYSTOSCOPY FLEXIBLE;  Surgeon: Thomasine Oiler, MD;  Location: [facility];  Service: Urology;  Laterality: N/A;  No seeds seen in the bladder   LEFT HEART CATH AND CORONARY ANGIOGRAPHY N/A 09/16/2020   Procedure: LEFT HEART CATH AND CORONARY ANGIOGRAPHY;  Surgeon: Anner Alm ORN, MD;  Location: [facility] INVASIVE CV LAB;  Service: Cardiovascular;  Laterality: N/A;   LUMBAR FUSION  1995   RADIOACTIVE SEED IMPLANT  09/14/2011   Procedure: RADIOACTIVE SEED IMPLANT;  Surgeon: Thomasine Oiler, MD;  Location: [facility];  Service: Urology;  Laterality: N/A;  Total number of seeds -     SHOULDER SURGERY  YRS AGO   LEFT    MEDICATIONS: Current Outpatient Medications on File Prior to Visit  Medication Sig Dispense Refill   albuterol  (VENTOLIN  HFA) 108 (90 Base) MCG/ACT inhaler INHALE 1 TO 2 PUFFS INTO THE LUNGS EVERY 4 HOURS AS NEEDED FOR WHEEZING OR SHORTNESS OF BREATH 8.5 g 3   aspirin  EC 81 MG tablet Take 81 mg by mouth every morning.      atorvastatin  (LIPITOR) 20 MG tablet TAKE 1 TABLET(20 MG) BY MOUTH DAILY 90 tablet 2   buprenorphine  (BUTRANS ) 10 MCG/HR PTWK Place 1 patch onto the skin every Friday.     diclofenac  Sodium (VOLTAREN ) 1 % GEL Apply 4 g topically 4 (four) times daily. 350 g 1   fluticasone  (FLONASE ) 50 MCG/ACT nasal spray Place 2 sprays into both nostrils daily. 16 g 6   isosorbide  mononitrate (IMDUR ) 30 MG 24 hr tablet Take 0.5 tablets (15 mg total) by mouth daily.     lidocaine  (XYLOCAINE ) 5 % ointment Apply 1 application topically daily as needed for mild pain or moderate pain. 35.44 g 1   loratadine  (CLARITIN ) 10 MG tablet Take 1 tablet (10 mg total) by mouth daily. 30 tablet 11    metoprolol  succinate (TOPROL -XL) 25 MG 24 hr tablet Take 1 tablet (25 mg total) by mouth at bedtime. 90 tablet 0   Multiple Vitamins-Minerals (ONE-A-DAY PROACTIVE 65+) TABS Take 1 tablet by mouth daily with breakfast.     omeprazole  (PRILOSEC) 40 MG capsule Take 1 capsule (40 mg total) by mouth daily. 90 capsule 0   oxyCODONE -acetaminophen  (PERCOCET) 10-325 MG tablet Take 1 tablet by mouth 4 (four) times daily as needed for pain.     polyethylene glycol (MIRALAX  / GLYCOLAX ) 17 g packet Take 17 g by mouth daily as needed for mild constipation. 14 each 0   SYSTANE ULTRA PF 0.4-0.3 % SOLN Place 1 drop into both eyes 3 (three) times daily as needed (for dryness).     Tiotropium Bromide -Olodaterol (STIOLTO RESPIMAT ) 2.5-2.5 MCG/ACT AERS  Inhale 2 puffs into the lungs daily. 4 g 2   TYLENOL  8 HOUR ARTHRITIS PAIN 650 MG CR tablet Take 650 mg by mouth every 8 (eight) hours as needed for pain.     furosemide  (LASIX ) 40 MG tablet Take 1 tablet (40 mg total) by mouth daily as needed for edema. (Patient not taking: Reported on 10/24/2023) 90 tablet 3   nitroGLYCERIN  (NITROSTAT ) 0.4 MG SL tablet Place 1 tablet (0.4 mg total) under the tongue every 5 (five) minutes as needed for chest pain (chest pain). (Patient not taking: Reported on 10/24/2023) 20 tablet 0   ondansetron  (ZOFRAN -ODT) 8 MG disintegrating tablet Take 1 tablet (8 mg total) by mouth every 8 (eight) hours as needed for nausea or vomiting. (Patient not taking: Reported on 10/24/2023) 20 tablet 0   ranolazine  (RANEXA ) 500 MG 12 hr tablet Take 1 tablet (500 mg total) by mouth 2 (two) times daily. (Patient not taking: Reported on 10/24/2023) 60 tablet 11   No current facility-administered medications on file prior to visit.    ALLERGIES: Allergies  Allergen Reactions   Tramadol  Nausea Only   Pollen Extract Other (See Comments)    Sneezing    FAMILY HISTORY: Family History  Problem Relation Age of Onset   Diabetes Mother    Heart disease Father         lived to 51 - had bypass, pacemaker    SOCIAL HISTORY: Social History   Socioeconomic History   Marital status: Legally Separated    Spouse name: Zelda   Number of children: 1   Years of education: 11   Highest education level: Not on file  Occupational History   Occupation: Retired    Associate Professor: RETIRED  Tobacco Use   Smoking status: Former    Current packs/day: 0.00    Average packs/day: 2.0 packs/day for 51.7 years (103.3 ttl pk-yrs)    Types: Cigarettes    Start date: 02/12/1953    Quit date: 10/13/2004    Years since quitting: 19.0    Passive exposure: Past   Smokeless tobacco: Never   Tobacco comments:    Smoked 2 ppd  Vaping Use   Vaping status: Never Used  Substance and Sexual Activity   Alcohol use: Yes    Alcohol/week: 14.0 standard drinks of alcohol    Types: 14 Shots of liquor per week    Comment: /wine 1 month   Drug use: No   Sexual activity: Not Currently  Other Topics Concern   Not on file  Social History Narrative   Patient lives with his granddaughter in [place] 1 story home, 3 steps   Patient is currently legally separated.    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    Patient enjoys seeing friends, hunting, and fishing.    Receives aide services 7 days week    Right hand   Drink coffee occasional         Social Drivers of Corporate investment banker Strain: Low Risk  (08/19/2023)   Overall Financial Resource Strain (CARDIA)    Difficulty of Paying Living Expenses: Not hard at all  Food Insecurity: No Food Insecurity (08/19/2023)   Hunger Vital Sign    Worried About Running Out of Food in the Last Year: Never true    Ran Out of Food in the Last Year: Never true  Transportation Needs: No Transportation Needs (08/19/2023)   PRAPARE - Administrator, Civil Service (Medical): No  Lack of Transportation (Non-Medical): No  Physical Activity: Insufficiently Active (08/19/2023)   Exercise Vital Sign    Days of  Exercise per Week: 3 days    Minutes of Exercise per Session: 30 min  Stress: No Stress Concern Present (08/19/2023)   [employer] of Occupational Health - Occupational Stress Questionnaire    Feeling of Stress: Not at all  Social Connections: Moderately Integrated (08/19/2023)   Social Connection and Isolation Panel    Frequency of Communication with Friends and Family: More than three times a week    Frequency of Social Gatherings with Friends and Family: More than three times a week    Attends Religious Services: 1 to 4 times per year    Active Member of [employer] or Organizations: No    Attends Banker Meetings: Never    Marital Status: Married  Recent Concern: Social Connections - Moderately Isolated (05/21/2023)   Social Connection and Isolation Panel    Frequency of Communication with Friends and Family: More than three times a week    Frequency of Social Gatherings with Friends and Family: More than three times a week    Attends Religious Services: More than 4 times per year    Active Member of [employer] or Organizations: No    Attends Banker Meetings: Never    Marital Status: Separated  Intimate Partner Violence: Not At Risk (08/19/2023)   Humiliation, Afraid, Rape, and Kick questionnaire    Fear of Current or Ex-Partner: No    Emotionally Abused: No    Physically Abused: No    Sexually Abused: No     PHYSICAL EXAM: Vitals:   10/24/23 0858  BP: 99/69  Pulse: 77   Orthostatic VS for the past 72 hrs (Last 3 readings):  Orthostatic BP Patient Position BP Location Cuff Size Orthostatic Pulse  10/24/23 0858 99/69 Standing Left Arm -- 77  10/24/23 0857 92/59 Standing Left Arm Normal 69  10/24/23 0845 138/83 Supine Left Arm -- 86    General: No acute distress Head:  Normocephalic/atraumatic Skin/Extremities: No rash, no edema Neurological Exam: Mental status: alert and awake, no dysarthria or aphasia, Fund of knowledge is appropriate. Attention and  concentration are normal.  Cranial nerves: CN I: not tested CN II: pupils equal, round, visual fields intact CN III, IV, VI:  full range of motion, no nystagmus, no ptosis CN V: facial sensation intact CN VII: upper and lower face symmetric CN VIII: hearing intact to conversation Bulk & Tone: normal, no fasciculations, +cogwheeling L>R Motor: 5/5 throughout with no pronator drift. Sensation: intact to light touch, cold, pin, vibration sense.  No extinction to double simultaneous stimulation. Deep Tendon Reflexes: +1 throughout Cerebellar: no incoordination on finger to nose testing Gait: knees bent together when walking (chronic per patient), no ataxia Tremor: +left hand resting tremor, no postural or endpoint tremor seen   IMPRESSION: This is an 83 year old right-handed man with a history of hyperlipidemia, rheumatoid arthritis, GERD, prostate cancer, presenting for evaluation of recurrent syncope. Episodes described as very brief body stiffening while sitting with unresponsiveness, no post-event confusion. He is noted to be orthostatic in the office today (supine 138/83 to standing 92/59), he was also started on metoprolol  for runs of SVT on Zio patch. Family also reporting more confusion, left arm ?weakness. Exam today shows a left hand resting tremor and some cogwheeling. We discussed different causes of syncope, from a neurological standpoint, open MRI brain without contrast (patient claustrophobic) and 1-hour EEG  will be ordered. If normal, we will do a 48-hour ambulatory EEG. [place] driving laws were discussed with the patient, and he knows to stop driving after an episode of loss of consciousness until 6 months event-free. Follow-up after tests, call for any changes.    Thank you for allowing me to participate in the care of this patient. Please do not hesitate to call for any questions or concerns.   Darice Shivers, M.D.  CC: Dr. Anders, Dr. Adele

## 2023-10-25 ENCOUNTER — Ambulatory Visit (INDEPENDENT_AMBULATORY_CARE_PROVIDER_SITE_OTHER): Admitting: Neurology

## 2023-10-25 DIAGNOSIS — R404 Transient alteration of awareness: Secondary | ICD-10-CM | POA: Diagnosis not present

## 2023-10-25 DIAGNOSIS — I951 Orthostatic hypotension: Secondary | ICD-10-CM

## 2023-10-25 NOTE — Progress Notes (Signed)
 PA submitted on [employer]. No PA required. Rjdz#:[id number].

## 2023-10-27 NOTE — Procedures (Signed)
 ELECTROENCEPHALOGRAM REPORT  Date of Study: 10/25/2023  Patient's Name: Joel Donaldson MRN: [id number] Date of Birth: 04/25/1940  Referring Provider: Dr. Darice Shivers  Clinical History: This is an 83 year old man with episodes of feeling hot, dizzy, with body stiffening and brief unresponsiveness. EEG for classification.  CNS Active Medications: Butrans  patch  Technical Summary: A multichannel digital 1-hour EEG recording measured by the international 10-20 system with electrodes applied with paste and impedances below 5000 ohms performed in our laboratory with EKG monitoring in an awake and asleep patient.  Hyperventilation was not performed. Photic stimulation was performed.  The digital EEG was referentially recorded, reformatted, and digitally filtered in a variety of bipolar and referential montages for optimal display.    Description: The patient is awake and asleep during the recording.  During maximal wakefulness, there is a symmetric, medium voltage 8 Hz posterior dominant rhythm that attenuates with eye opening.  The record is symmetric.  During drowsiness and stage I sleep, there is an increase in theta slowing of the background with vertex waves seen. Photic stimulation did not elicit any abnormalities.  There were no epileptiform discharges or electrographic seizures seen.    EKG lead was unremarkable.  Impression: This 1-hour awake and asleep EEG is normal.    Clinical Correlation: A normal EEG does not exclude a clinical diagnosis of epilepsy.  If further clinical questions remain, prolonged EEG may be helpful.  Clinical correlation is advised.   Darice Shivers, M.D.

## 2023-10-28 ENCOUNTER — Ambulatory Visit: Payer: Self-pay | Admitting: Family Medicine

## 2023-10-28 ENCOUNTER — Telehealth: Payer: Self-pay

## 2023-10-28 DIAGNOSIS — Z9181 History of falling: Secondary | ICD-10-CM | POA: Diagnosis not present

## 2023-10-28 DIAGNOSIS — M549 Dorsalgia, unspecified: Secondary | ICD-10-CM | POA: Diagnosis not present

## 2023-10-28 DIAGNOSIS — G8929 Other chronic pain: Secondary | ICD-10-CM | POA: Diagnosis not present

## 2023-10-28 DIAGNOSIS — Z79899 Other long term (current) drug therapy: Secondary | ICD-10-CM | POA: Diagnosis not present

## 2023-10-28 DIAGNOSIS — M25561 Pain in right knee: Secondary | ICD-10-CM | POA: Diagnosis not present

## 2023-10-28 DIAGNOSIS — M25562 Pain in left knee: Secondary | ICD-10-CM | POA: Diagnosis not present

## 2023-10-28 DIAGNOSIS — I10 Essential (primary) hypertension: Secondary | ICD-10-CM | POA: Diagnosis not present

## 2023-10-28 NOTE — Telephone Encounter (Signed)
 Called pt and told him that we moved his referral to [place] on [address] in [place] and that I will call and cancel [facility] imaging , he understood and was okay with it.

## 2023-10-30 ENCOUNTER — Other Ambulatory Visit: Payer: Self-pay | Admitting: *Deleted

## 2023-10-30 ENCOUNTER — Ambulatory Visit: Payer: Self-pay | Admitting: Neurology

## 2023-10-30 DIAGNOSIS — R404 Transient alteration of awareness: Secondary | ICD-10-CM

## 2023-11-05 ENCOUNTER — Other Ambulatory Visit

## 2023-11-08 ENCOUNTER — Other Ambulatory Visit

## 2023-11-12 DIAGNOSIS — Z8673 Personal history of transient ischemic attack (TIA), and cerebral infarction without residual deficits: Secondary | ICD-10-CM | POA: Diagnosis not present

## 2023-11-12 DIAGNOSIS — R404 Transient alteration of awareness: Secondary | ICD-10-CM | POA: Diagnosis not present

## 2023-11-18 ENCOUNTER — Other Ambulatory Visit: Payer: Self-pay | Admitting: Family Medicine

## 2023-11-25 DIAGNOSIS — M549 Dorsalgia, unspecified: Secondary | ICD-10-CM | POA: Diagnosis not present

## 2023-11-25 DIAGNOSIS — R7303 Prediabetes: Secondary | ICD-10-CM | POA: Diagnosis not present

## 2023-11-25 DIAGNOSIS — E559 Vitamin D deficiency, unspecified: Secondary | ICD-10-CM | POA: Diagnosis not present

## 2023-11-25 DIAGNOSIS — M25561 Pain in right knee: Secondary | ICD-10-CM | POA: Diagnosis not present

## 2023-11-25 DIAGNOSIS — M25562 Pain in left knee: Secondary | ICD-10-CM | POA: Diagnosis not present

## 2023-11-25 DIAGNOSIS — Z9181 History of falling: Secondary | ICD-10-CM | POA: Diagnosis not present

## 2023-11-25 DIAGNOSIS — Z79899 Other long term (current) drug therapy: Secondary | ICD-10-CM | POA: Diagnosis not present

## 2023-11-25 DIAGNOSIS — I10 Essential (primary) hypertension: Secondary | ICD-10-CM | POA: Diagnosis not present

## 2023-11-25 DIAGNOSIS — G8929 Other chronic pain: Secondary | ICD-10-CM | POA: Diagnosis not present

## 2023-11-25 DIAGNOSIS — M129 Arthropathy, unspecified: Secondary | ICD-10-CM | POA: Diagnosis not present

## 2023-11-26 ENCOUNTER — Encounter: Payer: Self-pay | Admitting: Family Medicine

## 2023-11-26 ENCOUNTER — Ambulatory Visit (INDEPENDENT_AMBULATORY_CARE_PROVIDER_SITE_OTHER): Admitting: Neurology

## 2023-11-26 ENCOUNTER — Ambulatory Visit: Admitting: Family Medicine

## 2023-11-26 VITALS — BP 115/81 | Ht 70.0 in | Wt 174.0 lb

## 2023-11-26 DIAGNOSIS — R55 Syncope and collapse: Secondary | ICD-10-CM | POA: Diagnosis not present

## 2023-11-26 DIAGNOSIS — E441 Mild protein-calorie malnutrition: Secondary | ICD-10-CM

## 2023-11-26 DIAGNOSIS — R0609 Other forms of dyspnea: Secondary | ICD-10-CM | POA: Diagnosis not present

## 2023-11-26 DIAGNOSIS — I471 Supraventricular tachycardia, unspecified: Secondary | ICD-10-CM

## 2023-11-26 DIAGNOSIS — R404 Transient alteration of awareness: Secondary | ICD-10-CM

## 2023-11-26 MED ORDER — NITROGLYCERIN 0.4 MG SL SUBL: 0.4000 mg | SUBLINGUAL_TABLET | SUBLINGUAL | 0 refills | Status: AC | PRN

## 2023-11-26 MED ORDER — METOPROLOL SUCCINATE ER 25 MG PO TB24: 25.0000 mg | ORAL_TABLET | Freq: Every day | ORAL | 0 refills | Status: DC

## 2023-11-26 NOTE — Assessment & Plan Note (Signed)
 Ongoing workup with neurology, pending MRI brain read and 48-hour EEG results.  Has follow-up in December.  Advised scheduling follow-up with me in 1 to 2 months to touch base about workup.

## 2023-11-26 NOTE — Assessment & Plan Note (Signed)
 Weight up 5 pounds today, patient appears well and has good family support.  Will continue to monitor at future appointments.

## 2023-11-26 NOTE — Patient Instructions (Addendum)
 Thank you for coming in today! Here is a summary of what we discussed:  - I sent in refills of several of your medicines.  You have refills available for many of them, just contact the pharmacy about this.  -Please schedule a follow up appointment with the cardiologists. You can ask them about your Lasix , Imdur , and see what they think about giving you a prescription for Viagra.  I will message them to call you or you can call them [phone]   You can get the Shingrix vaccine at the pharmacy. You will need a 2nd shot 2-6 months after the first. This vaccine is important to help prevent shingles.    Please call the clinic at [phone] if your symptoms worsen or you have any concerns.  Best, Dr Adele

## 2023-11-26 NOTE — Progress Notes (Signed)
    SUBJECTIVE:   CHIEF COMPLAINT / HPI:   Following up syncopal episodes -- Saw patient early August, started metoprolol  25 mg daily due to short runs of SVT on Zio. -- Patient had follow-up with neurology, states he had an MRI done (cannot see results in chart yet), had a short-term EEG that was normal, currently undergoing 48-hour EEG. -- Has not followed up with cardiology in the interim -- States he stopped Lasix  over the summer after he for started having syncopal episodes -- Unsure if he still supposed to taking Imdur , this expired on 9/11.  Was prescribed by cardiology -- States he did not take his blood pressure medicine today -- Requesting a prescription for Viagra, recently got engaged and would like to be intimate with his partner  History of mild protein malnutrition -- Weight is up 5 pounds today from last visit  Chronic pain -- mostly in knees due to osteoarthritis -- Has multimodal pain regimen, no concerns currently, does not need refills at this point  PERTINENT  PMH / PSH: HTN, CAD, COPD, GERD, RA, HLD  OBJECTIVE:   BP 115/81   Ht 5' 10 (1.778 m)   Wt 174 lb (78.9 kg)   BMI 24.97 kg/m   - General: No acute distress. Awake and conversant. - Eyes: Normal conjunctiva, anicteric. Round symmetric pupils. - ENT: Hearing grossly intact. No nasal discharge.  EEG leads on head. - Neck: Neck is supple. No masses or thyromegaly. - Respiratory: Respirations are non-labored. - Skin: No visible rashes or ulcers. - Psych: Alert and oriented. Cooperative, Appropriate mood and affect, Normal judgment. - MSK: Ambulates with cane - Neuro: Sensation and CN II-XII grossly normal.   ASSESSMENT/PLAN:   Assessment & Plan SVT (supraventricular tachycardia) Refilling metoprolol  today.  Advised patient to schedule follow-up with cardiology to review his blood pressure medicines and for consideration of Viagra. - metoprolol  succinate (TOPROL -XL) 25 MG 24 hr tablet; Take 1  tablet (25 mg total) by mouth at bedtime.  Dispense: 90 tablet; Refill: 0 Syncope, unspecified syncope type Ongoing workup with neurology, pending MRI brain read and 48-hour EEG results.  Has follow-up in December.  Advised scheduling follow-up with me in 1 to 2 months to touch base about workup. Mild protein malnutrition Weight up 5 pounds today, patient appears well and has good family support.  Will continue to monitor at future appointments.     Rea Raring, MD [facility]

## 2023-11-27 DIAGNOSIS — Z79899 Other long term (current) drug therapy: Secondary | ICD-10-CM | POA: Diagnosis not present

## 2023-11-28 NOTE — Progress Notes (Signed)
 AMB EEG discontinued.  Skin Breakdown:No Diary Returned: Yes - returned w no events noted. Patient mentioned maybe having an event around midnight but wasn't sure.

## 2023-12-02 ENCOUNTER — Other Ambulatory Visit: Payer: Self-pay | Admitting: Cardiovascular Disease

## 2023-12-02 ENCOUNTER — Other Ambulatory Visit: Payer: Self-pay | Admitting: Family Medicine

## 2023-12-02 DIAGNOSIS — I471 Supraventricular tachycardia, unspecified: Secondary | ICD-10-CM

## 2023-12-06 ENCOUNTER — Encounter: Payer: Self-pay | Admitting: Cardiovascular Disease

## 2023-12-06 ENCOUNTER — Other Ambulatory Visit: Payer: Self-pay | Admitting: Family Medicine

## 2023-12-06 ENCOUNTER — Ambulatory Visit: Attending: Cardiovascular Disease | Admitting: Cardiovascular Disease

## 2023-12-06 VITALS — BP 121/71 | HR 67 | Ht 70.0 in | Wt 172.8 lb

## 2023-12-06 DIAGNOSIS — I25118 Atherosclerotic heart disease of native coronary artery with other forms of angina pectoris: Secondary | ICD-10-CM

## 2023-12-06 DIAGNOSIS — E785 Hyperlipidemia, unspecified: Secondary | ICD-10-CM | POA: Diagnosis not present

## 2023-12-06 DIAGNOSIS — R079 Chest pain, unspecified: Secondary | ICD-10-CM

## 2023-12-06 LAB — LIPID PANEL
Chol/HDL Ratio: 2.4 ratio (ref 0.0–5.0)
Cholesterol, Total: 110 mg/dL (ref 100–199)
HDL: 45 mg/dL (ref >39)
LDL Chol Calc (NIH): 53 mg/dL (ref 0–99)
Triglycerides: 47 mg/dL (ref 0–149)
VLDL Cholesterol Cal: 12 mg/dL (ref 5–40)

## 2023-12-06 NOTE — Patient Instructions (Addendum)
 Medication Instructions:  No changes *If you need a refill on your cardiac medications before your next appointment, please call your pharmacy*  Lab Work: Lipid panel If you have labs (blood work) drawn today and your tests are completely normal, you will receive your results only by: MyChart Message (if you have MyChart) OR A paper copy in the mail If you have any lab test that is abnormal or we need to change your treatment, we will call you to review the results.  Testing/Procedures: None ordered  Follow-Up: At [facility], you and your health needs are our priority.  As part of our continuing mission to provide you with exceptional heart care, our providers are all part of one team.  This team includes your primary Cardiologist (physician) and Advanced Practice Providers or APPs (Physician Assistants and Nurse Practitioners) who all work together to provide you with the care you need, when you need it.  Your next appointment:   1 year(s)  Provider:   Jerel Balding, MD    We recommend signing up for the patient portal called MyChart.  Sign up information is provided on this After Visit Summary.  MyChart is used to connect with patients for Virtual Visits (Telemedicine).  Patients are able to view lab/test results, encounter notes, upcoming appointments, etc.  Non-urgent messages can be sent to your provider as well.   To learn more about what you can do with MyChart, go to [email or web address].

## 2023-12-06 NOTE — Progress Notes (Signed)
 Cardiology Office Note:    Date:  12/08/2023   ID:  Joel Donaldson, DOB [birth year], MRN [id number]  PCP:  Joel Song, MD   [facility] Providers Cardiologist:  Joel Balding, MD     Referring MD: Joel Song, MD   No chief complaint on file.    History of Present Illness:    Joel Donaldson is a 83 y.o. male with a hx of rheumatoid arthritis and prostate cancer treated with radioactive seed implantation, hypertension, COPD.  He does not have diabetes mellitus or hypercholesterolemia.  He has had a pattern of stable angina pectoris going on for over a year despite the fact that cardiac catheterization did not show severe epicardial coronary stenoses.  In August 2022 left heart catheterization showed a pattern of widespread plaque but with a maximum stenosis of 60% and the distal AV groove circumflex, with no evidence of obstruction by CT FFR.  More recently had a PET scan in May 2025 that showed borderline low myocardial blood flow reserve of 1.7 but otherwise normal findings (no perfusion abnormalities, EF 62%).  His symptoms have generally been well-controlled with isosorbide .  But he reports that he had an episode of dull chest discomfort lasting for about an hour last night.  We did an ECG and it looks normal, unchanged from previous tracings.  He is currently asymptomatic.  He has not needed to take any nitroglycerin  in long time.  He is very sedentary due to bilateral bad knees and generalized joint problems from rheumatoid arthritis.  In fact he is in a wheelchair today.  In addition to isosorbide  mononitrate he takes a low-dose of metoprolol  succinate 25 mg daily and ranolazine  500 mg twice daily.  His dose of metoprolol  had to reduce due to problems with hypotension.  His ophthalmologist detected a Hollenhorst plaque in August 2024 and we recommended bilateral carotid duplex ultrasound, with normal findings.  He wore an event monitor in January 2024 that showed no  evidence of atrial fibrillation although he did have frequent PACs representing 7% of all beats as well as several brief episodes of ectopic atrial tachycardia.  He has not had problems of orthopnea, PND or lower extremity edema.  He is not troubled by palpitations.  He has erectile dysfunction but understands that he cannot take PDE inhibitors while he is taking nitroglycerin  products.  09/05/2020 coronary CT angiogram which showed remarkably severe coronary atherosclerosis.  The calcium  score placing him in the 90th percentile for age/gender.  There was particularly severe calcium  buildup in the LAD artery.  By morphological criteria, there was severe stenosis in the distal left circumflex coronary artery where there was a severe stenosis with mild stenoses in the right coronary artery, proximal-mid LAD and OM1.  However by FFR, significant reduction in flow was seen in all 3 major coronary territories.  Due to ongoing symptoms underwent cardiac catheterization 09/16/2020 which essentially confirmed the findings on CT angiography.  The most significant lesion was 60% stenosis in the AV groove portion of the left circumflex coronary artery without evidence of flow-limiting hemodynamics by CT FFR.  He had normal left ventricular filling pressures.  Echocardiogram shows normal left ventricular systolic function and wall motion with mild LVH.    Past Medical History:  Diagnosis Date   Abnormal cardiac CT angiography 09/16/2020   Acute nonintractable headache 05/31/2021   Alcohol abuse    Anxiety    Arthritis    hands, back, knees   Asthma    Atypical  angina (HCC) (Class III) 12/19/2011   Baker's cyst of knee, right 06/21/2016   Chest pain at rest 10/04/2016   Depression    Epigastric pain 05/20/2020   Fall 10/23/2021   GERD (gastroesophageal reflux disease)    Glaucoma    Heart murmur asymptomatic   Hx of radioisotope therapy 07/26/2011   Radiosactive Prostate Seed Implant/ I-125 Seeds    Hypertension    Neck pain 10/22/2019   Prostate cancer (HCC) dx'd 2016 or 2017   seed implants   Rheumatoid arthritis (HCC) 10/10/2012    Past Surgical History:  Procedure Laterality Date   CYSTOSCOPY  09/14/2011   Procedure: CYSTOSCOPY FLEXIBLE;  Surgeon: Joel Oiler, MD;  Location: [facility];  Service: Urology;  Laterality: N/A;  No seeds seen in the bladder   LEFT HEART CATH AND CORONARY ANGIOGRAPHY N/A 09/16/2020   Procedure: LEFT HEART CATH AND CORONARY ANGIOGRAPHY;  Surgeon: Joel Alm ORN, MD;  Location: [facility] INVASIVE CV LAB;  Service: Cardiovascular;  Laterality: N/A;   LUMBAR FUSION  1995   RADIOACTIVE SEED IMPLANT  09/14/2011   Procedure: RADIOACTIVE SEED IMPLANT;  Surgeon: Joel Oiler, MD;  Location: [facility];  Service: Urology;  Laterality: N/A;  Total number of seeds -     SHOULDER SURGERY  YRS AGO   LEFT    Current Medications: Current Meds  Medication Sig   albuterol  (VENTOLIN  HFA) 108 (90 Base) MCG/ACT inhaler INHALE 1 TO 2 PUFFS INTO THE LUNGS EVERY 4 HOURS AS NEEDED FOR WHEEZING OR SHORTNESS OF BREATH   aspirin  EC 81 MG tablet Take 81 mg by mouth every morning.    atorvastatin  (LIPITOR) 20 MG tablet TAKE 1 TABLET(20 MG) BY MOUTH DAILY   buprenorphine  (BUTRANS ) 10 MCG/HR PTWK Place 1 patch onto the skin every Friday.   diclofenac  Sodium (VOLTAREN ) 1 % GEL Apply 4 g topically 4 (four) times daily.   fluticasone  (FLONASE ) 50 MCG/ACT nasal spray Place 2 sprays into both nostrils daily.   furosemide  (LASIX ) 40 MG tablet Take 1 tablet (40 mg total) by mouth daily as needed for edema.   isosorbide  mononitrate (IMDUR ) 30 MG 24 hr tablet Take 0.5 tablets (15 mg total) by mouth daily. (Patient taking differently: Take 30 mg by mouth daily.)   lidocaine  (XYLOCAINE ) 5 % ointment Apply 1 application topically daily as needed for mild pain or moderate pain.   loratadine  (CLARITIN ) 10 MG tablet Take 1 tablet (10 mg total) by mouth daily.    metoprolol  succinate (TOPROL -XL) 25 MG 24 hr tablet Take 1 tablet (25 mg total) by mouth at bedtime.   Multiple Vitamins-Minerals (ONE-A-DAY PROACTIVE 65+) TABS Take 1 tablet by mouth daily with breakfast.   oxyCODONE -acetaminophen  (PERCOCET) 10-325 MG tablet Take 1 tablet by mouth 4 (four) times daily as needed for pain.   polyethylene glycol (MIRALAX  / GLYCOLAX ) 17 g packet Take 17 g by mouth daily as needed for mild constipation.   ranolazine  (RANEXA ) 500 MG 12 hr tablet Take 1 tablet (500 mg total) by mouth 2 (two) times daily.   Tiotropium Bromide -Olodaterol (STIOLTO RESPIMAT ) 2.5-2.5 MCG/ACT AERS Inhale 2 puffs into the lungs daily.   TYLENOL  8 HOUR ARTHRITIS PAIN 650 MG CR tablet Take 650 mg by mouth every 8 (eight) hours as needed for pain.   [DISCONTINUED] omeprazole  (PRILOSEC) 40 MG capsule TAKE 1 CAPSULE(40 MG) BY MOUTH DAILY     Allergies:   Tramadol  and Pollen extract   Family History: The patient's family history includes  Diabetes in his mother; Heart disease in his father.  ROS:   Please see the history of present illness.     All other systems reviewed and are negative.  EKGs/Labs/Other Studies Reviewed:    The following studies were reviewed today:  Cardiac PET scan 06/19/2023   The study is normal for epicardial perfusion.  Decreased myocardial blood flow reserve of 1.7 may be seen in microvascular disease. The study is intermediate risk soley for this finding; no high risk findings were seen. .   LV perfusion is normal.   Rest left ventricular function is normal. Rest EF: 62%. Stress left ventricular function is normal. Stress EF: 64%. End diastolic cavity size is normal. End systolic cavity size is normal.   Myocardial blood flow was computed to be 1.40ml/g/min at rest and 2.14ml/g/min at stress. Global myocardial blood flow reserve was 1.69 and was mildly abnormal.   Coronary calcium  was present on the attenuation correction CT images. Severe coronary calcifications  were present. Coronary calcifications were present in the left anterior descending artery, left circumflex artery and right coronary artery distribution(s).Aortic atherosclerosis. Aortic valve calcification.  Cardiac catheterization 09/16/2020   LPAV lesion is 60% stenosed.   Dist LM to Ost LAD lesion is 30% stenosed.   The left ventricular systolic function is normal.   SUMMARY Mild diffuse multivessel disease with no obvious significant stenosis.   Most notable stenosis is in the distal AV groove LCx roughly 60% (not positive by CT FFR). Significant systemic hypertension with normal LV EDP. Normal LV function with no obvious wall motion abnormality Suspect MICROVASCULAR DISEASE - (NINOCA)  09/05/2020 Left main: 0   Left anterior descending artery: 989   Left circumflex artery: 259   Right coronary artery: 276   Total: 1523   Percentile: 90th   Other findings:   Normal pulmonary vein drainage into the left atrium.   Normal let atrial appendage without a thrombus.   Normal size of the pulmonary artery.   IMPRESSION: 1. Coronary calcium  score of 1523. This was 90th percentile for age-, race-, and sex-matched controls.   2. Normal coronary origin with left dominance.   3. There is severe (>70%) plaque in the distal LCX, and mild (25-49% stenosis in RCA, LAD, and OM1. CAD-RADS 4. 1. Left Main: FFRct 1.0   2. LAD: FFRct 0.95 proximal, 0.89 mid, 0.8 distal   3. LCX: FFRct 0.98 proximal, 0.98 mid, 0.77 distal.  OM1 FFRct 0.8   4. RCA: There are two small vessels arising form the RCA ostium. FFRct for RCA1 is 0.85. FFRct for RCA2 is 0.92 proximally and 0.69 mid.   Echocardiogram 06/28/2020   1. Left ventricular ejection fraction, by estimation, is 55 to 60%. The  left ventricle has normal function. The left ventricle has no regional  wall motion abnormalities. There is mild left ventricular hypertrophy.  Left ventricular diastolic parameters  are consistent with  Grade I diastolic dysfunction (impaired relaxation).   2. Right ventricular systolic function is normal. The right ventricular  size is normal.   3. The mitral valve is normal in structure. No evidence of mitral valve  regurgitation.   4. The aortic valve is tricuspid. Aortic valve regurgitation is mild.  Mild to moderate aortic valve sclerosis/calcification is present, without  any evidence of aortic stenosis.    EKG:    EKG Interpretation Date/Time:  Friday December 06 2023 09:31:53 EDT Ventricular Rate:  64 PR Interval:  184 QRS Duration:  72 QT Interval:  416  QTC Calculation: 429 R Axis:   -10  Text Interpretation: Normal sinus rhythm Normal ECG When compared with ECG of 29-Jul-2023 14:57, No significant change since last tracing Confirmed by Letetia Romanello [phone]) on 12/08/2023 4:38:10 PM         Recent Labs: 05/21/2023: B Natriuretic Peptide 37.7 07/26/2023: Magnesium  2.1 07/29/2023: ALT 16; BUN 13; Creatinine, Ser 0.84; Hemoglobin 11.6; Platelets 236; Potassium 3.8; Sodium 139  Recent Lipid Panel    Component Value Date/Time   CHOL 110 12/06/2023 1002   TRIG 47 12/06/2023 1002   HDL 45 12/06/2023 1002   CHOLHDL 2.4 12/06/2023 1002   CHOLHDL 3.2 01/18/2021 0434   VLDL 11 01/18/2021 0434   LDLCALC 53 12/06/2023 1002   LDLDIRECT 99 06/24/2007 2025     Risk Assessment/Calculations:           Physical Exam:    VS:  BP 121/71 (BP Location: Left Arm, Patient Position: Sitting, Cuff Size: Normal)   Pulse 67   Ht 5' 10 (1.778 m)   Wt 172 lb 12.8 oz (78.4 kg)   SpO2 93%   BMI 24.79 kg/m     Wt Readings from Last 3 Encounters:  12/06/23 172 lb 12.8 oz (78.4 kg)  11/26/23 174 lb (78.9 kg)  10/24/23 172 lb (78 kg)     General: Alert, oriented x3, no distress, in a wheelchair due to his knee problems Head: no evidence of trauma, PERRL, EOMI, no exophtalmos or lid lag, no myxedema, no xanthelasma; normal ears, nose and oropharynx Neck: normal jugular venous  pulsations and no hepatojugular reflux; brisk carotid pulses without delay and no carotid bruits Chest: clear to auscultation, no signs of consolidation by percussion or palpation, normal fremitus, symmetrical and full respiratory excursions Cardiovascular: normal position and quality of the apical impulse, regular rhythm, normal first and second heart sounds, no murmurs, rubs or gallops Abdomen: no tenderness or distention, no masses by palpation, no abnormal pulsatility or arterial bruits, normal bowel sounds, no hepatosplenomegaly Extremities: no clubbing, cyanosis or edema; 2+ radial, ulnar and brachial pulses bilaterally; 2+ right femoral, posterior tibial and dorsalis pedis pulses; 2+ left femoral, posterior tibial and dorsalis pedis pulses; no subclavian or femoral bruits Neurological: grossly nonfocal Psych: Normal mood and affect     ASSESSMENT:    1. Coronary artery disease involving native coronary artery of native heart with other form of angina pectoris   2. Hyperlipidemia LDL goal <70         PLAN:    In order of problems listed above:  CAD/angina/dyspnea on exertion: He continues to have infrequent episodes of chest discomfort.  We reviewed again the safe way to take sublingual nitroglycerin  and when he should seek urgent medical attention.  He appears to have small vessel disease rather than epicardial coronary obstructive disease and he has normal left ventricular systolic function.  Previously had good control of his angina on amlodipine  and metoprolol , but these medications had to be discontinued or reduced due to hypotension.  Continue isosorbide  mononitrate 30 mg daily, metoprolol  succinate 25 mg daily and ranolazine .  HLP: Last year LDL was excellent at 49, needs to be repeated. COPD: Uses albuterol  as needed.   Medication Adjustments/Labs and Tests Ordered: Current medicines are reviewed at length with the patient today.  Concerns regarding medicines are outlined  above.  Orders Placed This Encounter  Procedures   Lipid panel   EKG 12-Lead     No orders of the defined types were placed in  this encounter.      Patient Instructions  Medication Instructions:  No changes *If you need a refill on your cardiac medications before your next appointment, please call your pharmacy*  Lab Work: Lipid panel If you have labs (blood work) drawn today and your tests are completely normal, you will receive your results only by: MyChart Message (if you have MyChart) OR A paper copy in the mail If you have any lab test that is abnormal or we need to change your treatment, we will call you to review the results.  Testing/Procedures: None ordered  Follow-Up: At [facility], you and your health needs are our priority.  As part of our continuing mission to provide you with exceptional heart care, our providers are all part of one team.  This team includes your primary Cardiologist (physician) and Advanced Practice Providers or APPs (Physician Assistants and Nurse Practitioners) who all work together to provide you with the care you need, when you need it.  Your next appointment:   1 year(s)  Provider:   Jerel Balding, MD    We recommend signing up for the patient portal called MyChart.  Sign up information is provided on this After Visit Summary.  MyChart is used to connect with patients for Virtual Visits (Telemedicine).  Patients are able to view lab/test results, encounter notes, upcoming appointments, etc.  Non-urgent messages can be sent to your provider as well.   To learn more about what you can do with MyChart, go to [email or web address].      Signed, Joel Balding, MD  12/08/2023 4:46 PM    [facility]

## 2023-12-07 ENCOUNTER — Ambulatory Visit: Payer: Self-pay | Admitting: Cardiovascular Disease

## 2023-12-17 ENCOUNTER — Telehealth: Payer: Self-pay

## 2023-12-17 NOTE — Telephone Encounter (Signed)
 Request from [facility] for this medication. If you want patient to start this medication. Please send Rx to pharmacy. With dose, duration and instructions. Nelson Land, CMA

## 2023-12-20 ENCOUNTER — Other Ambulatory Visit: Payer: Self-pay

## 2023-12-20 DIAGNOSIS — J42 Unspecified chronic bronchitis: Secondary | ICD-10-CM

## 2023-12-20 MED ORDER — STIOLTO RESPIMAT 2.5-2.5 MCG/ACT IN AERS: 2.0000 | INHALATION_SPRAY | Freq: Every day | RESPIRATORY_TRACT | 2 refills | Status: DC

## 2023-12-23 ENCOUNTER — Ambulatory Visit: Payer: Self-pay | Admitting: Neurology

## 2023-12-23 NOTE — Procedures (Signed)
 ELECTROENCEPHALOGRAM REPORT  Dates of Recording: 11/26/2023 10:30AM to 11/28/2023 8:19AM  Patient's Name: Joel Donaldson MRN: [id number] Date of Birth: Nov 05, 1940  Referring Provider: Dr. Darice Shivers  Procedure: 45:54-hour ambulatory EEG  History: This is an 83 year old man with episodes of feeling hot, dizzy, with body stiffening and brief unresponsiveness. EEG for classification.   CNS Active Medications: Butrans  patch  Technical Summary: This is a 45:54-hour multichannel digital EEG recording measured by the international 10-20 system with electrodes applied with paste and impedances below 5000 ohms performed as portable with EKG monitoring.  The digital EEG was referentially recorded, reformatted, and digitally filtered in a variety of bipolar and referential montages for optimal display.    DESCRIPTION OF RECORDING: During maximal wakefulness, the background activity consisted of a symmetric 8 Hz posterior dominant rhythm which was reactive to eye opening.  There were no epileptiform discharges or focal slowing seen in wakefulness.  During the recording, the patient progresses through wakefulness, drowsiness, and Stage 2 sleep.  Again, there were no epileptiform discharges seen.  Events: Patient mentioned maybe having an event around midnight but wasn't sure. No push button event at that time. Electrographically, there were no EEG or EKG changes seen around this time.  There were 9 push button events that appear accidental, no video available, patient did not report symptoms on symptom diary.  There were no electrographic seizures seen.  EKG lead was unremarkable.  IMPRESSION: This 45:54-hour ambulatory EEG study is normal.    CLINICAL CORRELATION: A normal EEG does not exclude a clinical diagnosis of epilepsy. Typical events were not captured.  If further clinical questions remain, inpatient video EEG monitoring may be helpful.   Darice Shivers, M.D.

## 2023-12-24 NOTE — Telephone Encounter (Signed)
 Pt called an informed 2-day EEG was normal pt had his MRI September 30th [facility] was called to get his results sent over

## 2023-12-24 NOTE — Telephone Encounter (Signed)
-----   Message from Darice CHRISTELLA Shivers sent at 12/23/2023  3:43 PM EST ----- Pls let him know the 2-day EEG was normal. I don't see the brain MRI done yet, he needed an open one, can you pls ask patient and f/u? Thanks ----- Message ----- From: Shivers Darice CHRISTELLA, MD Sent: 12/23/2023   5:57 AM EST To: Darice CHRISTELLA Shivers, MD

## 2024-01-03 NOTE — Telephone Encounter (Signed)
 [facility] call ID: [id number]  Caller: Loron Well; Self PH: [phone]  Reason for call: Returning a missed call from the office.

## 2024-01-06 NOTE — Telephone Encounter (Signed)
-----   Message from Darice CHRISTELLA Shivers sent at 12/30/2023  8:42 AM EST ----- Pls let him know we were able to get the MRI report, brain MRI looks fine, it shows age-related changes. No abnormalities to cause passing out. I have an opening on Dec 1 at 10am for f/u if he can do  that, thanks ----- Message ----- From: Taft Powell CHRISTELLA, LPN Sent: [id number]/88/7974  12:20 PM EST To: Darice CHRISTELLA Shivers, MD  ----- Message from Powell CHRISTELLA Taft, LPN sent at 88/88/7974 12:20 PM EST -----   ----- Message ----- From: Shivers Darice CHRISTELLA, MD Sent: 12/23/2023   3:43 PM EST To: Powell CHRISTELLA Taft, LPN  Pls let him know the 2-day EEG was normal. I don't see the brain MRI done yet, he needed an open one, can you pls ask patient and f/u? Thanks ----- Message ----- From: Shivers Darice CHRISTELLA, MD Sent: 12/23/2023   5:57 AM EST To: Darice CHRISTELLA Shivers, MD

## 2024-01-06 NOTE — Telephone Encounter (Signed)
 Pt called an informed we were able to get the MRI report, brain MRI looks fine, it shows age-related changes. No abnormalities to cause passing out.

## 2024-01-28 ENCOUNTER — Ambulatory Visit: Admitting: Family Medicine

## 2024-01-28 ENCOUNTER — Encounter: Payer: Self-pay | Admitting: Family Medicine

## 2024-01-28 VITALS — BP 137/91 | HR 75 | Ht 70.0 in | Wt 178.6 lb

## 2024-01-28 DIAGNOSIS — R55 Syncope and collapse: Secondary | ICD-10-CM | POA: Diagnosis not present

## 2024-01-28 DIAGNOSIS — R1030 Lower abdominal pain, unspecified: Secondary | ICD-10-CM

## 2024-01-28 DIAGNOSIS — I10 Essential (primary) hypertension: Secondary | ICD-10-CM | POA: Diagnosis not present

## 2024-01-28 DIAGNOSIS — I25118 Atherosclerotic heart disease of native coronary artery with other forms of angina pectoris: Secondary | ICD-10-CM

## 2024-01-28 DIAGNOSIS — M170 Bilateral primary osteoarthritis of knee: Secondary | ICD-10-CM

## 2024-01-28 NOTE — Assessment & Plan Note (Signed)
 Reported abdominal pain appears to be related to knee OA symptoms, which are exacerbated in the colder weather. Benign abdominal exam. Has effective pain regimen managed by pain specialist. Advised monitoring sx and following up if abd pain worsens or persists. Can use miralax  PRN for constipation.

## 2024-01-28 NOTE — Assessment & Plan Note (Signed)
 Elevated x2 today. Reports normal readings at home. Elevations today may be in the setting of knee/lower abd pain as below. Continue current regimen.

## 2024-01-28 NOTE — Assessment & Plan Note (Signed)
 Stable, reviewed note from recent cardiology visit. No changes today

## 2024-01-28 NOTE — Assessment & Plan Note (Signed)
 No abnormalities found so far with neuro or cardiac workup. Has neuro follow up this week, will inquire whether MRI brain is needed. PRN follow up depending on ongoing workup per neuro.

## 2024-01-28 NOTE — Patient Instructions (Signed)
 Thank you for coming in today! Here is a summary of what we discussed:  -Sounds like your abdominal pain is related to your knee/leg discomfort. Please let me know if this gets worse or does not improve as the weather gets warmer.  Enjoy your holidays and see you in the new year!!  Please call the clinic at [phone] if your symptoms worsen or you have any concerns.  Best, Dr Adele

## 2024-01-28 NOTE — Progress Notes (Signed)
° ° °  SUBJECTIVE:   CHIEF COMPLAINT / HPI:   Abd pain x 1 week --was constipated about 1 week ago, miralax  helped --no ongoing constipation --no dysuria or difficulty peeing --feels that pain is radiating up from knees/thighs to lower abdomen. Joint pain worse in the cold weather. Takes percocet and pain patches- prescribed by pain management clinic --no fevers, nausea, vomiting --no sick contacts --normal PO intake  Dizziness and syncope --AMB EEG normal --has f/u with neuro 12/19 --has not had MRI brain yet --had one episode of dizziness about 1 month ago, no syncopal episodes for a while, no falls --feels well today  CAD with stable angina --no severe stenosis on cardiac cath in 2022 --No chest pain --Some SOB with exertion but this is not new for him. No SOB at rest --Cards follow up 10/24: Continue imdur  and metop 25, ranolazine . Lipid panel repeated- continue same meds. No PDE inhibitors while using nitroglycerin  products   HTN --has been taking meds daily --Checks BP at home- readings around 120/50  Had 1 shingles shots at [facility] in 2016  PERTINENT  PMH / PSH: reviewed  OBJECTIVE:   BP (!) 137/91   Pulse 75   Ht 5' 10 (1.778 m)   Wt 178 lb 9.6 oz (81 kg)   SpO2 95%   BMI 25.63 kg/m   General: Awake and conversant, no acute distress CV: RRR, normal S1/S2, no M/R/G Abd: soft, nontender, nondistended Pulm: CTAB, normal work of breathing on room air, no W/R/R. Neuro: No focal deficits Psych: Appropriate mood and affect   ASSESSMENT/PLAN:   Assessment & Plan Syncope, unspecified syncope type No abnormalities found so far with neuro or cardiac workup. Has neuro follow up this week, will inquire whether MRI brain is needed. PRN follow up depending on ongoing workup per neuro. Hypertension, unspecified type Elevated x2 today. Reports normal readings at home. Elevations today may be in the setting of knee/lower abd pain as below. Continue current  regimen. Coronary artery disease of native artery of native heart with stable angina pectoris Stable, reviewed note from recent cardiology visit. No changes today Osteoarthritis of both knees, unspecified osteoarthritis type Lower abdominal pain Reported abdominal pain appears to be related to knee OA symptoms, which are exacerbated in the colder weather. Benign abdominal exam. Has effective pain regimen managed by pain specialist. Advised monitoring sx and following up if abd pain worsens or persists. Can use miralax  PRN for constipation.     Rea Raring, MD [facility]

## 2024-01-31 ENCOUNTER — Ambulatory Visit: Admitting: Neurology

## 2024-01-31 ENCOUNTER — Encounter: Payer: Self-pay | Admitting: Neurology

## 2024-03-04 ENCOUNTER — Ambulatory Visit: Admitting: Family Medicine

## 2024-03-04 ENCOUNTER — Encounter: Payer: Self-pay | Admitting: Family Medicine

## 2024-03-04 VITALS — BP 127/76 | HR 100 | Ht 70.0 in | Wt 180.2 lb

## 2024-03-04 DIAGNOSIS — I25118 Atherosclerotic heart disease of native coronary artery with other forms of angina pectoris: Secondary | ICD-10-CM

## 2024-03-04 DIAGNOSIS — R0609 Other forms of dyspnea: Secondary | ICD-10-CM | POA: Diagnosis not present

## 2024-03-04 NOTE — Patient Instructions (Addendum)
 Thank you for coming in today! Here is a summary of what we discussed:  - Seems like your symptoms are due to overexertion when playing with your granddaughter a few days ago.  Please let us  know if this persists or worsens.  - Please go to the pharmacy ask for the second shingles vaccine dose  Please call the clinic at [phone] if your symptoms worsen or you have any concerns.  Best, Dr Adele

## 2024-03-04 NOTE — Assessment & Plan Note (Signed)
 No signs of acute illness or COPD exacerbation today.  Benign respiratory exam and appropriate vitals.  Believe symptoms are related to overexertion when playing with granddaughter a few days ago.  Last saw cardiology in October 2025, was noted to have infrequent episodes of chest discomfort at that time.  Adherent with Imdur , metoprolol , ranolazine  and has nitroglycerin  to use as needed.  Fortunately, does not report chest pain with this episode.   Reviewed return precautions.

## 2024-03-04 NOTE — Progress Notes (Signed)
" ° ° °  SUBJECTIVE:   CHIEF COMPLAINT / HPI:   SOB, fatigue -- States he has been with his granddaughter the other day and he felt tired afterwards -- Reports dyspnea only with exertion, feels fine at rest -- Denies recent illness, fever, cough or sputum production -- Denies chest pain -- No changes in medications recently -- Denies dizziness or syncopal episodes -- Reviewed medications, patient reports adherence with all of these   PERTINENT  PMH / PSH: Hypertension, CAD, COPD, GERD, RA, prostate cancer, EtOH use, chronic opioid use  OBJECTIVE:   BP 127/76   Pulse 100   Ht 5' 10 (1.778 m)   Wt 180 lb 3.2 oz (81.7 kg)   SpO2 98%   BMI 25.86 kg/m   General: Awake and conversant, no acute distress CV: RRR, normal S1/S2, no M/R/G Pulm: CTAB, normal work of breathing on room air, no W/R/R. Neuro: No focal deficits Psych: Appropriate mood and affect   ASSESSMENT/PLAN:   Assessment & Plan Dyspnea on exertion Coronary artery disease of native artery of native heart with stable angina pectoris No signs of acute illness or COPD exacerbation today.  Benign respiratory exam and appropriate vitals.  Believe symptoms are related to overexertion when playing with granddaughter a few days ago.  Last saw cardiology in October 2025, was noted to have infrequent episodes of chest discomfort at that time.  Adherent with Imdur , metoprolol , ranolazine  and has nitroglycerin  to use as needed.  Fortunately, does not report chest pain with this episode.   Reviewed return precautions.     Rea Raring, MD [facility] "

## 2024-03-11 ENCOUNTER — Other Ambulatory Visit: Payer: Self-pay | Admitting: Family Medicine

## 2024-03-11 DIAGNOSIS — I471 Supraventricular tachycardia, unspecified: Secondary | ICD-10-CM

## 2024-03-13 ENCOUNTER — Other Ambulatory Visit: Payer: Self-pay | Admitting: Family Medicine

## 2024-03-13 DIAGNOSIS — J42 Unspecified chronic bronchitis: Secondary | ICD-10-CM

## 2024-03-17 ENCOUNTER — Other Ambulatory Visit: Payer: Self-pay

## 2024-03-17 DIAGNOSIS — J449 Chronic obstructive pulmonary disease, unspecified: Secondary | ICD-10-CM

## 2024-03-17 MED ORDER — ALBUTEROL SULFATE HFA 108 (90 BASE) MCG/ACT IN AERS: INHALATION_SPRAY | RESPIRATORY_TRACT | 3 refills | Status: AC

## 2024-03-18 ENCOUNTER — Ambulatory Visit: Admitting: Family Medicine

## 2024-03-18 VITALS — BP 101/67 | HR 93 | Ht 70.0 in | Wt 175.4 lb

## 2024-03-18 DIAGNOSIS — M25561 Pain in right knee: Secondary | ICD-10-CM

## 2024-03-18 DIAGNOSIS — M170 Bilateral primary osteoarthritis of knee: Secondary | ICD-10-CM

## 2024-03-18 MED ORDER — METHYLPREDNISOLONE ACETATE 40 MG/ML IJ SUSP
40.0000 mg | Freq: Once | INTRAMUSCULAR | Status: AC
  Administered 2024-03-18: 40 mg via INTRAMUSCULAR

## 2024-03-18 NOTE — Assessment & Plan Note (Signed)
 PROCEDURE:  Risks & benefits of right knee aspiration & cortisone injection reviewed. Consent obtained. Time-out completed. Patient prepped and draped in the normal fashion. Area cleansed with alcohol swab. Ethyl chloride spray used to anesthetize the skin. An 18-g 1.5-inch needle on 10-mL syringe inserted into right knee via superior-lateral approach with ultrasound guidance.  Aspirated 55 cc of yellow synovial fluid (in addition to two uses of a 20 mL syringe) - was not sent for analysis. Synovial fluid without significant blood, visible crystals, or purulence - less likely septic arthritis or crystalline arthropathy, especially given history and exam.  Needle left in place, syringe exchanged for syringe with solution of 3 mL 1% lidocaine  with 1 mL methylprednisolone  (Depo-medrol ) 40mg /mL.  This was injected into the right knee without complication. Patient tolerated procedure well without any complications. Area covered with adhesive bandage. Post-procedure care given.  All questions answered. HHPT ordered for patient. Also recommended use of voltaren  gel for pain.

## 2024-03-18 NOTE — Patient Instructions (Signed)
 We removed 55 mL of fluid from your knee and injected a numbing medicine with steroid into the space to help with pain. Let me know if you experience any worsening redness, pain, swelling, or fevers. I have ordered physical therapy for the knee. I also recommend voltaren  gel to the knees for pain.

## 2024-03-18 NOTE — Progress Notes (Signed)
" ° °  SUBJECTIVE:   CHIEF COMPLAINT / HPI:  Discussed the use of AI scribe software for clinical note transcription with the patient, who gave verbal consent to proceed.  History of Present Illness Joel Donaldson is an 84 year old male with severe tricompartmental osteoarthritis who presents with right knee pain.  Right knee pain and swelling - Sudden onset of severe right knee pain began last night when getting out of bed to use the bathroom - Pain described as 'grabbing' and severe enough to require sitting back down - Unable to walk on the right knee this morning - Associated with increased swelling compared to the left knee - Popping sensation with ambulation - No redness present over the right knee - No recent fever  History of osteoarthritis and prior interventions - Severe tricompartmental osteoarthritis in both knees - History of moderate knee effusions - Prior steroid injections for knee pain - Right knee Baker cyst present - Joint aspiration in 2018 was attempted without success - Joint aspiration in 2022 was successful and relieved symptoms  Analgesic use - Uses Percocet 10/325 mg twice daily for knee and shoulder pain - Applies buprenorphine  7.5 mcg weekly patch over the knees  OBJECTIVE:  BP 101/67   Pulse 93   Ht 5' 10 (1.778 m)   Wt 175 lb 6.4 oz (79.6 kg)   SpO2 99%   BMI 25.17 kg/m   Physical Exam GENERAL: Alert, cooperative, well developed, no acute distress. MUSCULOSKELETAL: Left knee swollen, right knee slightly less swollen. No erythema or excessive warmth in knees. Range of motion in both knees limited with pain. Negative valgus and varus testing in knees. Tenderness in right knee, focused at medial, lateral joint lines, and subpatellar region. Patient walks with limp. NEUROLOGICAL: Cranial nerves grossly intact, moves all extremities without gross motor or sensory deficit.  Limited R knee ultrasound: 1.70 cm largest fluid collection appreciated at  suprapatellar pouch, questionable hematoma/bone/cartilage remnants in medial suprapatellar space  ASSESSMENT/PLAN:   Assessment & Plan Osteoarthritis of both knees, unspecified osteoarthritis type PROCEDURE:  Risks & benefits of right knee aspiration & cortisone injection reviewed. Consent obtained. Time-out completed. Patient prepped and draped in the normal fashion. Area cleansed with alcohol swab. Ethyl chloride spray used to anesthetize the skin. An 18-g 1.5-inch needle on 10-mL syringe inserted into right knee via superior-lateral approach with ultrasound guidance.  Aspirated 55 cc of yellow synovial fluid (in addition to two uses of a 20 mL syringe) - was not sent for analysis. Synovial fluid without significant blood, visible crystals, or purulence - less likely septic arthritis or crystalline arthropathy, especially given history and exam.  Needle left in place, syringe exchanged for syringe with solution of 3 mL 1% lidocaine  with 1 mL methylprednisolone  (Depo-medrol ) 40mg /mL.  This was injected into the right knee without complication. Patient tolerated procedure well without any complications. Area covered with adhesive bandage. Post-procedure care given.  All questions answered. HHPT ordered for patient. Also recommended use of voltaren  gel for pain.   Stuart Redo, MD [facility]  "

## 2024-08-20 ENCOUNTER — Encounter
